# Patient Record
Sex: Male | Born: 1970 | Race: Black or African American | Hispanic: No | Marital: Single | Smoking: Current every day smoker
Health system: Southern US, Community
[De-identification: ages and names within clinical notes are randomized; demographics above are authoritative.]

## PROBLEM LIST (undated history)

## (undated) ENCOUNTER — Emergency Department (HOSPITAL_COMMUNITY): Admission: EM | Payer: Self-pay | Source: Home / Self Care

## (undated) ENCOUNTER — Emergency Department (HOSPITAL_COMMUNITY): Payer: Self-pay

## (undated) DIAGNOSIS — F323 Major depressive disorder, single episode, severe with psychotic features: Secondary | ICD-10-CM

## (undated) DIAGNOSIS — K529 Noninfective gastroenteritis and colitis, unspecified: Secondary | ICD-10-CM

## (undated) DIAGNOSIS — F1010 Alcohol abuse, uncomplicated: Secondary | ICD-10-CM

## (undated) DIAGNOSIS — F32A Depression, unspecified: Secondary | ICD-10-CM

## (undated) DIAGNOSIS — D649 Anemia, unspecified: Secondary | ICD-10-CM

---

## 2000-12-14 ENCOUNTER — Emergency Department (HOSPITAL_COMMUNITY): Admission: EM | Admit: 2000-12-14 | Discharge: 2000-12-14 | Payer: Self-pay | Admitting: Emergency Medicine

## 2001-10-23 ENCOUNTER — Encounter: Payer: Self-pay | Admitting: Emergency Medicine

## 2001-10-23 ENCOUNTER — Emergency Department (HOSPITAL_COMMUNITY): Admission: EM | Admit: 2001-10-23 | Discharge: 2001-10-23 | Payer: Self-pay | Admitting: Emergency Medicine

## 2002-01-27 ENCOUNTER — Encounter: Payer: Self-pay | Admitting: Emergency Medicine

## 2002-01-27 ENCOUNTER — Emergency Department (HOSPITAL_COMMUNITY): Admission: EM | Admit: 2002-01-27 | Discharge: 2002-01-27 | Payer: Self-pay | Admitting: Emergency Medicine

## 2002-06-01 ENCOUNTER — Emergency Department (HOSPITAL_COMMUNITY): Admission: EM | Admit: 2002-06-01 | Discharge: 2002-06-01 | Payer: Self-pay | Admitting: Emergency Medicine

## 2002-06-01 ENCOUNTER — Encounter: Payer: Self-pay | Admitting: Emergency Medicine

## 2004-09-28 ENCOUNTER — Inpatient Hospital Stay (HOSPITAL_COMMUNITY): Admission: EM | Admit: 2004-09-28 | Discharge: 2004-09-28 | Payer: Self-pay | Admitting: *Deleted

## 2005-03-03 ENCOUNTER — Emergency Department (HOSPITAL_COMMUNITY): Admission: EM | Admit: 2005-03-03 | Discharge: 2005-03-03 | Payer: Self-pay | Admitting: Emergency Medicine

## 2005-03-11 ENCOUNTER — Emergency Department (HOSPITAL_COMMUNITY): Admission: EM | Admit: 2005-03-11 | Discharge: 2005-03-12 | Payer: Self-pay | Admitting: Emergency Medicine

## 2005-03-25 ENCOUNTER — Emergency Department (HOSPITAL_COMMUNITY): Admission: EM | Admit: 2005-03-25 | Discharge: 2005-03-25 | Payer: Self-pay | Admitting: Emergency Medicine

## 2006-02-14 ENCOUNTER — Emergency Department (HOSPITAL_COMMUNITY): Admission: EM | Admit: 2006-02-14 | Discharge: 2006-02-14 | Payer: Self-pay | Admitting: Emergency Medicine

## 2006-03-23 ENCOUNTER — Emergency Department (HOSPITAL_COMMUNITY): Admission: EM | Admit: 2006-03-23 | Discharge: 2006-03-23 | Payer: Self-pay | Admitting: Emergency Medicine

## 2006-03-26 ENCOUNTER — Emergency Department (HOSPITAL_COMMUNITY): Admission: EM | Admit: 2006-03-26 | Discharge: 2006-03-26 | Payer: Self-pay | Admitting: Emergency Medicine

## 2006-09-22 ENCOUNTER — Emergency Department (HOSPITAL_COMMUNITY): Admission: EM | Admit: 2006-09-22 | Discharge: 2006-09-22 | Payer: Self-pay | Admitting: Emergency Medicine

## 2010-09-19 NOTE — H&P (Signed)
[name], [name]            ACCOUNT NO.:  [id number]   MEDICAL RECORD NO.:  [id number]          PATIENT TYPE:  INP   LOCATION:  1826                         FACILITY:  [facility]   PHYSICIAN:  Jackie Plum, M.D.DATE OF BIRTH:  Aug 09, 1970   DATE OF ADMISSION:  09/27/2004  DATE OF DISCHARGE:                                HISTORY & PHYSICAL   CHIEF COMPLAINT:  Decreased responsiveness.   HISTORY OF PRESENT ILLNESS:  Please note that the patient is unable to give  history at this point.  All history is from the patient's ER records and  mostly ER physician's notes.   The patient is a 40 year old African American gentleman who was brought to  the ER on account of the above complaint.  Apparently the patient had been  drinking alcohol and was brought to the ED in a stuporous state.  In the  emergency room, blood work was remarkable for alcohol level of 416 mg/dl.  His urine drug screen was positive for tetrahydrocannabinol.  Observation  admit was therefore requested by ED physician.   Past medical history, medicines, family history, and social history are not  available.  Review of systems was not available at this point.  Again,  review of systems was not obtained on account of patient's condition.   PHYSICAL EXAMINATION:  VITAL SIGNS:  Blood pressure 126/71,  pulse 81,  respirations 12, temperature 97.9 rectally.  O2 saturation was 87.7 on room  air.  GENERAL:  The patient was not in any acute cardiopulmonary distress.  The  patient was arousable by deep sternal pressure with localization and he  moved all four extremities spontaneously.  HEENT:  Pupils were equal, round, reactive to light.  Normocephalic,  atraumatic.  There was no conjunctival pallor or icterus.  Oropharynx was  moist.  LUNGS:  Breath sounds were adequate and were vesicular.  CARDIAC:  Regular with no adenopathy.  ABDOMEN:  Soft, nontender.  EXTREMITIES:  No edema, no cyanosis.   LABORATORY DATA:   Sodium was 145, potassium 3.5, chloride 111, BUN 12,  glucose 88.  PH 7.234, PCO2 46.1, bicarb 24.5.  Hemoglobin 41, hematocrit  13.9.  Alcohol level 416 mg/dl.  Urine drug screen was positive for  tetrahydrocannabinol.  X-ray was notable for mild bronchitic change without  any acute infiltrates.   IMPRESSION:  Alcohol intoxication with drug abuse; i.e. marijuana.  The  patient admitted to step-down bed for supportive care.  He is currently  given IV normal saline.  We will switch him from IV normal saline to D-5  normal saline with potassium.  Alcohol withdrawal protocol will be  instituted.  He will be carefully watched for any aspiration.  GI  prophylaxis will also be instituted.  The patient does not seem to have any  acute neurologic deficit at this point and the cause of his mental status  changes are likely due to purely alcohol intoxication and marijuana  abuse.  ED physician, Dr. Stacie Acres had mentioned to me that they had done an CT  scan of the head which was negative.  However, the result was unavailable  for  my review at this point.  Purpose of admission is for early observation  only overnight until the patient status is improved and possibly referred  for alcohol rehab as needed.      GO/MEDQ  D:  09/28/2004  T:  09/28/2004  Job:  [id number]

## 2010-12-06 ENCOUNTER — Emergency Department (HOSPITAL_COMMUNITY)
Admission: EM | Admit: 2010-12-06 | Discharge: 2010-12-07 | Disposition: A | Discharge status: Home / Self Care | Payer: Self-pay | Attending: Emergency Medicine | Admitting: Emergency Medicine

## 2010-12-06 DIAGNOSIS — M545 Low back pain, unspecified: Secondary | ICD-10-CM | POA: Insufficient documentation

## 2010-12-06 DIAGNOSIS — F1010 Alcohol abuse, uncomplicated: Secondary | ICD-10-CM | POA: Insufficient documentation

## 2010-12-07 ENCOUNTER — Emergency Department (HOSPITAL_COMMUNITY)
Admission: EM | Admit: 2010-12-07 | Discharge: 2010-12-08 | Disposition: A | Discharge status: Home / Self Care | Payer: Self-pay | Attending: Emergency Medicine | Admitting: Emergency Medicine

## 2010-12-07 DIAGNOSIS — F3289 Other specified depressive episodes: Secondary | ICD-10-CM | POA: Insufficient documentation

## 2010-12-07 DIAGNOSIS — M549 Dorsalgia, unspecified: Secondary | ICD-10-CM | POA: Insufficient documentation

## 2010-12-07 DIAGNOSIS — G8929 Other chronic pain: Secondary | ICD-10-CM | POA: Insufficient documentation

## 2010-12-07 DIAGNOSIS — F1210 Cannabis abuse, uncomplicated: Secondary | ICD-10-CM | POA: Insufficient documentation

## 2010-12-07 DIAGNOSIS — F1010 Alcohol abuse, uncomplicated: Secondary | ICD-10-CM | POA: Insufficient documentation

## 2010-12-07 DIAGNOSIS — F329 Major depressive disorder, single episode, unspecified: Secondary | ICD-10-CM | POA: Insufficient documentation

## 2010-12-07 LAB — DIFFERENTIAL
Basophils Absolute: 0 10*3/uL (ref 0.0–0.1)
Basophils Relative: 1 % (ref 0–1)
Eosinophils Absolute: 0.3 10*3/uL (ref 0.0–0.7)
Eosinophils Relative: 4 % (ref 0–5)
Lymphocytes Relative: 48 % — ABNORMAL HIGH (ref 12–46)
Lymphs Abs: 1.8 10*3/uL (ref 0.7–4.0)
Monocytes Absolute: 0.5 10*3/uL (ref 0.1–1.0)
Monocytes Relative: 7 % (ref 3–12)
Neutro Abs: 2.9 10*3/uL (ref 1.7–7.7)
Neutro Abs: 2.3 10*3/uL (ref 1.7–7.7)
Neutrophils Relative %: 48 % (ref 43–77)

## 2010-12-07 LAB — URINALYSIS, ROUTINE W REFLEX MICROSCOPIC
Glucose, UA: NEGATIVE mg/dL
Hgb urine dipstick: NEGATIVE
Leukocytes, UA: NEGATIVE
Protein, ur: NEGATIVE mg/dL
Specific Gravity, Urine: 1.021 (ref 1.005–1.030)
pH: 5 (ref 5.0–8.0)

## 2010-12-07 LAB — POCT I-STAT, CHEM 8
BUN: 10 mg/dL (ref 6–23)
Calcium, Ion: 1.22 mmol/L (ref 1.12–1.32)
Creatinine, Ser: 1.1 mg/dL (ref 0.50–1.35)
Creatinine, Ser: 0.8 mg/dL (ref 0.50–1.35)
Glucose, Bld: 127 mg/dL — ABNORMAL HIGH (ref 70–99)
Glucose, Bld: 108 mg/dL — ABNORMAL HIGH (ref 70–99)
HCT: 38 % — ABNORMAL LOW (ref 39.0–52.0)
Hemoglobin: 12.9 g/dL — ABNORMAL LOW (ref 13.0–17.0)
Hemoglobin: 12.9 g/dL — ABNORMAL LOW (ref 13.0–17.0)
Potassium: 3.8 mEq/L (ref 3.5–5.1)
Potassium: 4.2 mEq/L (ref 3.5–5.1)
TCO2: 27 mmol/L (ref 0–100)

## 2010-12-07 LAB — CBC
HCT: 34.4 % — ABNORMAL LOW (ref 39.0–52.0)
Hemoglobin: 11.2 g/dL — ABNORMAL LOW (ref 13.0–17.0)
Hemoglobin: 12 g/dL — ABNORMAL LOW (ref 13.0–17.0)
MCH: 30.5 pg (ref 26.0–34.0)
MCHC: 32.6 g/dL (ref 30.0–36.0)
MCV: 93.7 fL (ref 78.0–100.0)
Platelets: 255 10*3/uL (ref 150–400)
RBC: 3.81 MIL/uL — ABNORMAL LOW (ref 4.22–5.81)
RDW: 13.7 % (ref 11.5–15.5)
WBC: 4.8 10*3/uL (ref 4.0–10.5)

## 2010-12-07 LAB — GLUCOSE, CAPILLARY: Glucose-Capillary: 172 mg/dL — ABNORMAL HIGH (ref 70–99)

## 2010-12-07 LAB — RAPID URINE DRUG SCREEN, HOSP PERFORMED
Amphetamines: NOT DETECTED
Barbiturates: NOT DETECTED
Benzodiazepines: NOT DETECTED
Benzodiazepines: NOT DETECTED
Cocaine: NOT DETECTED
Opiates: NOT DETECTED
Tetrahydrocannabinol: POSITIVE — AB

## 2010-12-07 LAB — ETHANOL: Alcohol, Ethyl (B): 38 mg/dL — ABNORMAL HIGH (ref 0–11)

## 2010-12-10 ENCOUNTER — Emergency Department (HOSPITAL_COMMUNITY)
Admission: EM | Admit: 2010-12-10 | Discharge: 2010-12-10 | Disposition: A | Discharge status: Home / Self Care | Payer: Self-pay | Attending: Emergency Medicine | Admitting: Emergency Medicine

## 2010-12-10 DIAGNOSIS — IMO0001 Reserved for inherently not codable concepts without codable children: Secondary | ICD-10-CM | POA: Insufficient documentation

## 2010-12-10 DIAGNOSIS — F3289 Other specified depressive episodes: Secondary | ICD-10-CM | POA: Insufficient documentation

## 2010-12-10 DIAGNOSIS — F329 Major depressive disorder, single episode, unspecified: Secondary | ICD-10-CM | POA: Insufficient documentation

## 2010-12-10 LAB — COMPREHENSIVE METABOLIC PANEL
Albumin: 4.2 g/dL (ref 3.5–5.2)
Alkaline Phosphatase: 54 U/L (ref 39–117)
BUN: 8 mg/dL (ref 6–23)
CO2: 29 mEq/L (ref 19–32)
Chloride: 105 mEq/L (ref 96–112)
GFR calc Af Amer: >60 mL/min (ref >60)
GFR calc non Af Amer: >60 mL/min (ref >60)
Glucose, Bld: 89 mg/dL (ref 70–99)
Potassium: 4.1 mEq/L (ref 3.5–5.1)
Total Bilirubin: 0.2 mg/dL — ABNORMAL LOW (ref 0.3–1.2)

## 2010-12-10 LAB — CBC
HCT: 34.4 % — ABNORMAL LOW (ref 39.0–52.0)
Hemoglobin: 11.1 g/dL — ABNORMAL LOW (ref 13.0–17.0)
MCV: 94.2 fL (ref 78.0–100.0)
WBC: 6.5 10*3/uL (ref 4.0–10.5)

## 2010-12-10 LAB — DIFFERENTIAL
Basophils Absolute: 0.1 10*3/uL (ref 0.0–0.1)
Lymphocytes Relative: 49 % — ABNORMAL HIGH (ref 12–46)
Lymphs Abs: 3.2 10*3/uL (ref 0.7–4.0)
Neutro Abs: 2.4 10*3/uL (ref 1.7–7.7)

## 2010-12-10 LAB — RAPID URINE DRUG SCREEN, HOSP PERFORMED: Amphetamines: NOT DETECTED

## 2010-12-10 LAB — ETHANOL: Alcohol, Ethyl (B): 228 mg/dL — ABNORMAL HIGH (ref 0–11)

## 2011-05-26 ENCOUNTER — Emergency Department (HOSPITAL_COMMUNITY)
Admission: EM | Admit: 2011-05-26 | Discharge: 2011-05-26 | Disposition: A | Discharge status: Other Acute Inpatient Hospital | Payer: Self-pay | Attending: Internal Medicine | Admitting: Internal Medicine

## 2011-05-26 ENCOUNTER — Encounter (HOSPITAL_COMMUNITY): Payer: Self-pay | Admitting: Emergency Medicine

## 2011-05-26 ENCOUNTER — Encounter (HOSPITAL_COMMUNITY): Payer: Self-pay | Admitting: *Deleted

## 2011-05-26 ENCOUNTER — Inpatient Hospital Stay (HOSPITAL_COMMUNITY)
Admission: AD | Admit: 2011-05-26 | Discharge: 2011-06-01 | DRG: 897 | Disposition: A | Discharge status: Home / Self Care | Payer: PRIVATE HEALTH INSURANCE | Source: Ambulatory Visit | Attending: Psychiatry | Admitting: Psychiatry

## 2011-05-26 DIAGNOSIS — F10939 Alcohol use, unspecified with withdrawal, unspecified: Secondary | ICD-10-CM

## 2011-05-26 DIAGNOSIS — F329 Major depressive disorder, single episode, unspecified: Secondary | ICD-10-CM

## 2011-05-26 DIAGNOSIS — F1020 Alcohol dependence, uncomplicated: Principal | ICD-10-CM

## 2011-05-26 DIAGNOSIS — M549 Dorsalgia, unspecified: Secondary | ICD-10-CM

## 2011-05-26 DIAGNOSIS — F1010 Alcohol abuse, uncomplicated: Secondary | ICD-10-CM | POA: Insufficient documentation

## 2011-05-26 DIAGNOSIS — R45851 Suicidal ideations: Secondary | ICD-10-CM

## 2011-05-26 DIAGNOSIS — G8929 Other chronic pain: Secondary | ICD-10-CM

## 2011-05-26 DIAGNOSIS — F3289 Other specified depressive episodes: Secondary | ICD-10-CM | POA: Insufficient documentation

## 2011-05-26 DIAGNOSIS — F17200 Nicotine dependence, unspecified, uncomplicated: Secondary | ICD-10-CM | POA: Insufficient documentation

## 2011-05-26 DIAGNOSIS — F1910 Other psychoactive substance abuse, uncomplicated: Secondary | ICD-10-CM | POA: Diagnosis not present

## 2011-05-26 DIAGNOSIS — F10239 Alcohol dependence with withdrawal, unspecified: Secondary | ICD-10-CM

## 2011-05-26 HISTORY — DX: Depression, unspecified: F32.A

## 2011-05-26 LAB — BASIC METABOLIC PANEL
BUN: 6 mg/dL (ref 6–23)
Calcium: 9.5 mg/dL (ref 8.4–10.5)
GFR calc non Af Amer: >90 mL/min (ref >90)
Glucose, Bld: 111 mg/dL — ABNORMAL HIGH (ref 70–99)

## 2011-05-26 LAB — CBC
Hemoglobin: 13.5 g/dL (ref 13.0–17.0)
MCH: 32.6 pg (ref 26.0–34.0)
MCHC: 34.4 g/dL (ref 30.0–36.0)
MCV: 94.9 fL (ref 78.0–100.0)
Platelets: 232 10*3/uL (ref 150–400)
RBC: 4.14 MIL/uL — ABNORMAL LOW (ref 4.22–5.81)

## 2011-05-26 LAB — DIFFERENTIAL
Eosinophils Absolute: 0.3 10*3/uL (ref 0.0–0.7)
Eosinophils Relative: 4 % (ref 0–5)
Lymphs Abs: 3.4 10*3/uL (ref 0.7–4.0)
Monocytes Relative: 9 % (ref 3–12)

## 2011-05-26 LAB — RAPID URINE DRUG SCREEN, HOSP PERFORMED: Barbiturates: NOT DETECTED

## 2011-05-26 LAB — URINALYSIS, ROUTINE W REFLEX MICROSCOPIC
Nitrite: NEGATIVE
Specific Gravity, Urine: 1.014 (ref 1.005–1.030)
Urobilinogen, UA: 0.2 mg/dL (ref 0.0–1.0)

## 2011-05-26 LAB — ETHANOL: Alcohol, Ethyl (B): 275 mg/dL — ABNORMAL HIGH (ref 0–11)

## 2011-05-26 MED ORDER — THIAMINE HCL 100 MG/ML IJ SOLN
100.0000 mg | Freq: Once | INTRAMUSCULAR | Status: AC
  Administered 2011-05-26: 100 mg via INTRAMUSCULAR

## 2011-05-26 MED ORDER — CHLORDIAZEPOXIDE HCL 25 MG PO CAPS
25.0000 mg | ORAL_CAPSULE | ORAL | Status: AC
  Administered 2011-05-29 – 2011-05-30 (×2): 25 mg via ORAL
  Filled 2011-05-26 (×2): qty 1

## 2011-05-26 MED ORDER — ONDANSETRON 4 MG PO TBDP: 4.0000 mg | ORAL_TABLET | Freq: Four times a day (QID) | ORAL | Status: AC | PRN

## 2011-05-26 MED ORDER — ACETAMINOPHEN 325 MG PO TABS: 650.0000 mg | ORAL_TABLET | Freq: Four times a day (QID) | ORAL | Status: DC | PRN

## 2011-05-26 MED ORDER — LORAZEPAM 1 MG PO TABS: 1.0000 mg | ORAL_TABLET | Freq: Three times a day (TID) | ORAL | Status: DC | PRN

## 2011-05-26 MED ORDER — ZOLPIDEM TARTRATE 5 MG PO TABS: 5.0000 mg | ORAL_TABLET | Freq: Every evening | ORAL | Status: DC | PRN

## 2011-05-26 MED ORDER — FOLIC ACID 1 MG PO TABS
1.0000 mg | ORAL_TABLET | Freq: Every day | ORAL | Status: DC
  Administered 2011-05-26: 1 mg via ORAL
  Filled 2011-05-26: qty 1

## 2011-05-26 MED ORDER — LORAZEPAM 1 MG PO TABS
0.0000 mg | ORAL_TABLET | Freq: Four times a day (QID) | ORAL | Status: DC
  Administered 2011-05-26: 1 mg via ORAL

## 2011-05-26 MED ORDER — CHLORDIAZEPOXIDE HCL 25 MG PO CAPS
25.0000 mg | ORAL_CAPSULE | Freq: Four times a day (QID) | ORAL | Status: AC
  Administered 2011-05-27 – 2011-05-28 (×6): 25 mg via ORAL
  Filled 2011-05-26 (×6): qty 1

## 2011-05-26 MED ORDER — CHLORDIAZEPOXIDE HCL 25 MG PO CAPS
25.0000 mg | ORAL_CAPSULE | Freq: Every day | ORAL | Status: AC
  Filled 2011-05-26: qty 1

## 2011-05-26 MED ORDER — VITAMIN B-1 100 MG PO TABS
100.0000 mg | ORAL_TABLET | Freq: Every day | ORAL | Status: DC
  Administered 2011-05-26: 100 mg via ORAL
  Filled 2011-05-26: qty 1

## 2011-05-26 MED ORDER — IBUPROFEN 600 MG PO TABS
600.0000 mg | ORAL_TABLET | Freq: Three times a day (TID) | ORAL | Status: DC | PRN
  Administered 2011-05-26: 600 mg via ORAL
  Filled 2011-05-26: qty 1

## 2011-05-26 MED ORDER — ADULT MULTIVITAMIN W/MINERALS CH
1.0000 | ORAL_TABLET | Freq: Every day | ORAL | Status: DC
  Administered 2011-05-26: 1 via ORAL
  Filled 2011-05-26: qty 1

## 2011-05-26 MED ORDER — VITAMIN B-1 100 MG PO TABS
100.0000 mg | ORAL_TABLET | Freq: Every day | ORAL | Status: DC
  Administered 2011-05-27 – 2011-06-01 (×6): 100 mg via ORAL
  Filled 2011-05-26 (×8): qty 1

## 2011-05-26 MED ORDER — LORAZEPAM 1 MG PO TABS: 0.0000 mg | ORAL_TABLET | Freq: Two times a day (BID) | ORAL | Status: DC

## 2011-05-26 MED ORDER — HYDROXYZINE HCL 25 MG PO TABS: 25.0000 mg | ORAL_TABLET | Freq: Four times a day (QID) | ORAL | Status: AC | PRN

## 2011-05-26 MED ORDER — ADULT MULTIVITAMIN W/MINERALS CH
1.0000 | ORAL_TABLET | Freq: Every day | ORAL | Status: DC
  Administered 2011-05-26: 22:00:00 via ORAL
  Administered 2011-05-27 – 2011-06-01 (×6): 1 via ORAL
  Filled 2011-05-26 (×8): qty 1

## 2011-05-26 MED ORDER — CHLORDIAZEPOXIDE HCL 25 MG PO CAPS
25.0000 mg | ORAL_CAPSULE | Freq: Three times a day (TID) | ORAL | Status: AC
  Administered 2011-05-28 – 2011-05-29 (×2): 25 mg via ORAL
  Filled 2011-05-26 (×2): qty 1

## 2011-05-26 MED ORDER — NICOTINE 21 MG/24HR TD PT24
21.0000 mg | MEDICATED_PATCH | Freq: Every day | TRANSDERMAL | Status: DC
  Filled 2011-05-26: qty 1

## 2011-05-26 MED ORDER — LOPERAMIDE HCL 2 MG PO CAPS: 2.0000 mg | ORAL_CAPSULE | ORAL | Status: AC | PRN

## 2011-05-26 MED ORDER — LORAZEPAM 1 MG PO TABS
1.0000 mg | ORAL_TABLET | Freq: Four times a day (QID) | ORAL | Status: DC | PRN
  Filled 2011-05-26: qty 1

## 2011-05-26 MED ORDER — ALUM & MAG HYDROXIDE-SIMETH 200-200-20 MG/5ML PO SUSP: 30.0000 mL | ORAL | Status: DC | PRN

## 2011-05-26 MED ORDER — ONDANSETRON HCL 4 MG PO TABS: 4.0000 mg | ORAL_TABLET | Freq: Three times a day (TID) | ORAL | Status: DC | PRN

## 2011-05-26 MED ORDER — THIAMINE HCL 100 MG/ML IJ SOLN: 100.0000 mg | Freq: Every day | INTRAMUSCULAR | Status: DC

## 2011-05-26 MED ORDER — MAGNESIUM HYDROXIDE 400 MG/5ML PO SUSP: 30.0000 mL | Freq: Every day | ORAL | Status: DC | PRN

## 2011-05-26 MED ORDER — CHLORDIAZEPOXIDE HCL 25 MG PO CAPS
50.0000 mg | ORAL_CAPSULE | Freq: Once | ORAL | Status: AC
  Administered 2011-05-26: 50 mg via ORAL
  Filled 2011-05-26: qty 2

## 2011-05-26 MED ORDER — CHLORDIAZEPOXIDE HCL 25 MG PO CAPS: 25.0000 mg | ORAL_CAPSULE | Freq: Four times a day (QID) | ORAL | Status: AC | PRN

## 2011-05-26 MED ORDER — LORAZEPAM 2 MG/ML IJ SOLN: 1.0000 mg | Freq: Four times a day (QID) | INTRAMUSCULAR | Status: DC | PRN

## 2011-05-26 NOTE — ED Provider Notes (Signed)
History     CSN: [id number]  Arrival date & time 05/26/11  0348   First MD Initiated Contact with Patient 05/26/11 [phone]      Chief Complaint  Patient presents with  . Medical Clearance    (Consider location/radiation/quality/duration/timing/severity/associated sxs/prior treatment) HPI  History reviewed. No pertinent past medical history.  History reviewed. No pertinent past surgical history.  History reviewed. No pertinent family history.  History  Substance Use Topics  . Smoking status: Current Everyday Smoker -- 0.5 packs/day  . Smokeless tobacco: Not on file  . Alcohol Use: Yes     heavy       Review of Systems  Allergies  Review of patient's allergies indicates no known allergies.  Home Medications  No current outpatient prescriptions on file.  BP 102/63  Pulse 97  Temp(Src) 98.4 F (36.9 C) (Oral)  Resp 16  SpO2 92%  Physical Exam  ED Course  Procedures (including critical care time)  Labs Reviewed  CBC - Abnormal; Notable for the following:    RBC 4.14 (*)    All other components within normal limits  BASIC METABOLIC PANEL - Abnormal; Notable for the following:    Glucose, Bld 111 (*)    All other components within normal limits  ETHANOL - Abnormal; Notable for the following:    Alcohol, Ethyl (B) 275 (*)    All other components within normal limits  DIFFERENTIAL  URINE RAPID DRUG SCREEN (HOSP PERFORMED)  URINALYSIS, ROUTINE W REFLEX MICROSCOPIC   No results found.   No diagnosis found.    MDM  Duplicate note        Doug Sou, MD 05/26/11 2320

## 2011-05-26 NOTE — Progress Notes (Signed)
Patient ID: [name], male   DOB: 1971/01/04, 41 y.o.   MRN: [id number] Pt is voluntary. Pt came into the ED and was positive for SI but had no plan and currently denies SI/HI. Pt states he does not currently hear voices but at times he does. Pt states that he is depressed and his drinking has increased. Pt states he drinks about two 12 pack of beers a day. Pt does state he has back pain periodically and right now denies any pain. Pt is motivated to get help and is cooperative and pleasant.

## 2011-05-26 NOTE — ED Notes (Signed)
CIWA 6 but patient refused to take the Ativan after it was opened, med wasted

## 2011-05-26 NOTE — BH Assessment (Addendum)
Assessment Note   [name] is an 41 y.o. male. Patient is suicidal with no plan. He reports worsening depression due to his "situation". Pt sts, "My living situation, my money, everything makes me feel like nothing". Patient is living with various friends and has no support here in this area. He reports having limited support due to his immediate family living in [place]. Patient has no history of previous self harm. He is unable to contract for safety. Patient denies HI. Patient also denies AVH. Patient also reports alcohol use. Sts he drinks a "couple of cases of beer" per day however it depends on his money. Patient has drank heavily for the past several yrs. He received treatment in the past at a facility in [place] for alcohol abuse. Pt denies drug use.  Axis I: Major Depression, single episode and Alcohol Dependence Axis II: Deferred Axis III:  Past Medical History  Diagnosis Date  . Depression    Axis IV: economic problems, housing problems, occupational problems, other psychosocial or environmental problems, problems related to social environment, problems with access to health care services and problems with primary support group Axis V: 31-40 impairment in reality testing  Past Medical History:  Past Medical History  Diagnosis Date  . Depression     History reviewed. No pertinent past surgical history.  Family History: History reviewed. No pertinent family history.  Social History:  reports that he has been smoking.  He does not have any smokeless tobacco history on file. He reports that he drinks about 50.4 ounces of alcohol per week. He reports that he does not use illicit drugs.  Additional Social History:  Alcohol / Drug Use Pain Medications: none reported Prescriptions: none reported Over the Counter: none reported History of alcohol / drug use?: Yes Substance #1 Name of Substance 1: Alcohol 1 - Age of First Use: 10 1 - Amount (size/oz): "couple of 12  packs of beer" and "depends on my money" 1 - Frequency: daily  1 - Duration: "past several yrs" 1 - Last Use / Amount: "last night"; pt sts he is unable to recall how much he drank Allergies: No Known Allergies  Home Medications:  Medications Prior to Admission  Medication Dose Route Frequency Provider Last Rate Last Dose  . folic acid (FOLVITE) tablet 1 mg  1 mg Oral Daily Doug Sou, MD   1 mg at 05/26/11 9562  . ibuprofen (ADVIL,MOTRIN) tablet 600 mg  600 mg Oral Q8H PRN Ethelda Chick, MD   600 mg at 05/26/11 0830  . LORazepam (ATIVAN) tablet 1 mg  1 mg Oral Q6H PRN Doug Sou, MD       Or  . LORazepam (ATIVAN) injection 1 mg  1 mg Intravenous Q6H PRN Doug Sou, MD      . LORazepam (ATIVAN) tablet 0-4 mg  0-4 mg Oral Q6H Doug Sou, MD   1 mg at 05/26/11 0824   Followed by  . LORazepam (ATIVAN) tablet 0-4 mg  0-4 mg Oral Q12H Doug Sou, MD      . LORazepam (ATIVAN) tablet 1 mg  1 mg Oral Q8H PRN Ethelda Chick, MD      . mulitivitamin with minerals tablet 1 tablet  1 tablet Oral Daily Doug Sou, MD   1 tablet at 05/26/11 [phone]  . nicotine (NICODERM CQ - dosed in mg/24 hours) patch 21 mg  21 mg Transdermal Daily Ethelda Chick, MD      . ondansetron [facility]) tablet  4 mg  4 mg Oral Q8H PRN Ethelda Chick, MD      . thiamine (VITAMIN B-1) tablet 100 mg  100 mg Oral Daily Doug Sou, MD   100 mg at 05/26/11 1610   Or  . thiamine (B-1) injection 100 mg  100 mg Intravenous Daily Doug Sou, MD      . zolpidem (AMBIEN) tablet 5 mg  5 mg Oral QHS PRN Ethelda Chick, MD       No current outpatient prescriptions on file as of 05/26/2011.    OB/GYN Status:  No LMP for male patient.  General Assessment Data Location of Assessment: [facility] ED ACT Assessment: Yes Living Arrangements: Other (Comment);Homeless (lives with various friends) Can pt return to current living arrangement?: Yes Admission Status: Involuntary Is patient capable of signing voluntary  admission?: Yes Transfer from: Acute Hospital Referral Source: Self/Family/Friend     Risk to self Suicidal Ideation: Yes-Currently Present Suicidal Intent: Yes-Currently Present Is patient at risk for suicide?: Yes Suicidal Plan?: No Access to Means: Yes (sharp objects) Specify Access to Suicidal Means:  (knives) What has been your use of drugs/alcohol within the last 12 months?:  (alcohol-beer) Previous Attempts/Gestures: No How many times?:  (n/a) Other Self Harm Risks:  (n/a) Triggers for Past Attempts:  (no previous attempts noted) Intentional Self Injurious Behavior: None Family Suicide History: No Recent stressful life event(s): Other (Comment);Financial Problems (living arrangements,unemployment, economical) Persecutory voices/beliefs?: No Depression: Yes Depression Symptoms: Despondent;Isolating;Fatigue;Guilt;Loss of interest in usual pleasures;Feeling worthless/self pity;Feeling angry/irritable Substance abuse history and/or treatment for substance abuse?: Yes (Alcoholism; pt receiving tx at [facility]) Suicide prevention information given to non-admitted patients: Not applicable  Risk to Others Homicidal Ideation: No Thoughts of Harm to Others: No Current Homicidal Intent: No Current Homicidal Plan: No Access to Homicidal Means: No Identified Victim:  (n/a) History of harm to others?: No Assessment of Violence: None Noted Violent Behavior Description:  (n/a) Criminal Charges Pending?: No Does patient have a court date: No  Psychosis Hallucinations: None noted Delusions: None noted  Mental Status Report Appear/Hygiene: Disheveled Eye Contact: Poor Motor Activity: Unremarkable Speech: Logical/coherent Level of Consciousness: Quiet/awake Mood: Depressed;Helpless Affect: Depressed Anxiety Level: None Thought Processes: Coherent Judgement: Unimpaired Orientation: Place;Person;Time Obsessive Compulsive Thoughts/Behaviors: None  Cognitive  Functioning Concentration: Decreased Memory: Recent Intact;Remote Intact IQ: Average Insight: Poor Impulse Control: Poor Appetite: Poor Weight Loss:  (n/a) Weight Gain:  (n/a) Sleep:  (n/a) Total Hours of Sleep:  ("3-4 and sometimes up to 6 hrs per day") Vegetative Symptoms: None  Prior Inpatient Therapy Prior Inpatient Therapy: Yes Prior Therapy Dates:  ("yrs ago" per patient) Prior Therapy Facilty/Provider(s):  (facility in [place]) Reason for Treatment:  (alcoholism; alcohol treatment)  Prior Outpatient Therapy Prior Outpatient Therapy: No  ADL Screening (condition at time of admission) Patient's cognitive ability adequate to safely complete daily activities?: No Patient able to express need for assistance with ADLs?: No Independently performs ADLs?: No Communication: Independent Dressing (OT): Independent Grooming: Independent Feeding: Independent Bathing: Independent Toileting: Independent In/Out Bed: Independent Walks in Home: Independent Weakness of Legs: None Weakness of Arms/Hands: None  Home Assistive Devices/Equipment Home Assistive Devices/Equipment: None    Abuse/Neglect Assessment (Assessment to be complete while patient is alone) Physical Abuse: Denies Verbal Abuse: Denies Sexual Abuse: Denies Exploitation of patient/patient's resources: Denies Self-Neglect: Denies Values / Beliefs Cultural Requests During Hospitalization: None Spiritual Requests During Hospitalization: None Consults Spiritual Care Consult Needed: No Social Work Consult Needed: No Merchant navy officer (For Healthcare) Advance Directive: Patient  does not have advance directive Nutrition Screen Diet: Regular  Additional Information 1:1 In Past 12 Months?: No CIRT Risk: No Elopement Risk: No Does patient have medical clearance?: Yes     Disposition:  Referred to [facility] for a potential in-pt admission. Information faxed to [facility] for on call psychiatrist, NP, and/or PA to  review.     On Site Evaluation by:   Reviewed with Physician:     Melynda Ripple [facility] 05/26/2011 11:47 AM

## 2011-05-26 NOTE — ED Notes (Signed)
Per EMS pt states he is tired and is just wanting to end his life and has been feeling this way for a while now  Pt states he drank a 12 pack of beer today and has not eaten in 3 days  VS stable

## 2011-05-26 NOTE — ED Provider Notes (Signed)
Note: pt agrees to transfer to [facility] on voluntary basis  Doug Sou, MD 05/26/11 1549

## 2011-05-26 NOTE — ED Provider Notes (Signed)
330 pm pt remains alert, cooperative. Feels depressed, cooperastive, gcs 15  Doug Sou, MD 05/26/11 1546

## 2011-05-26 NOTE — ED Provider Notes (Signed)
History     CSN: [id number]  Arrival date & time 05/26/11  0348   First MD Initiated Contact with Patient 05/26/11 [phone]      Chief Complaint  Patient presents with  . Medical Clearance    (Consider location/radiation/quality/duration/timing/severity/associated sxs/prior treatment) HPI Patient presenting with feelings of depression and alcohol abuse. He states that he drank 12 beers today. He denies other substance abuse. He states that he has been feeling more depressed over the past several weeks and has not been taking his medications for several months. He does not remember the names of his medications. He states he came to the emergency department tonight because he has been having thoughts of suicide and it scared him. There's no specific inciting event. He denies any hallucinations. He denies other medical problems and has not had any recent medical illnesses including no fevers coughs chest pain vomiting or diarrhea. He does endorse that he has had a poor appetite over the past several weeks.  There are no other alleviating or modifying factors, there are no associated systemic symptoms  History reviewed. No pertinent past medical history.  History reviewed. No pertinent past surgical history.  History reviewed. No pertinent family history.  History  Substance Use Topics  . Smoking status: Current Everyday Smoker -- 0.5 packs/day  . Smokeless tobacco: Not on file  . Alcohol Use: Yes     heavy       Review of Systems ROS reviewed and otherwise negative except for mentioned in HPI  Allergies  Review of patient's allergies indicates no known allergies.  Home Medications  No current outpatient prescriptions on file.  BP 102/63  Pulse 97  Temp(Src) 98.4 F (36.9 C) (Oral)  Resp 16  SpO2 92% Vitals reviewed Physical Exam Physical Examination: General appearance - alert,tired bu well appearing, mildly intoxicated and in no distress Mental status - alert, oriented  to person, place, and time Mouth - mucous membranes moist, pharynx normal without lesions Chest - clear to auscultation, no wheezes, rales or rhonchi, symmetric air entry Heart - normal rate, regular rhythm, normal S1, S2, no murmurs, rubs, clicks or gallops Abdomen - soft, nontender, nondistended, no masses or organomegaly Musculoskeletal - no joint tenderness, deformity or swelling Extremities - peripheral pulses normal, no pedal edema, no clubbing or cyanosis Skin - normal coloration and turgor, no rashes Psych- flat affect, no hallucinations or delusional thinking evident  ED Course  Procedures (including critical care time)  Labs Reviewed  CBC - Abnormal; Notable for the following:    RBC 4.14 (*)    All other components within normal limits  BASIC METABOLIC PANEL - Abnormal; Notable for the following:    Glucose, Bld 111 (*)    All other components within normal limits  ETHANOL - Abnormal; Notable for the following:    Alcohol, Ethyl (B) 275 (*)    All other components within normal limits  DIFFERENTIAL  URINE RAPID DRUG SCREEN (HOSP PERFORMED)  URINALYSIS, ROUTINE W REFLEX MICROSCOPIC   No results found.   1. Alcohol abuse   2. Depression       MDM  Patient presenting with depression, suicidal thoughts, and alcohol abuse. Medical clearance labs were obtained. There were reassuring except for elevated alcohol level. Patient was moved to the psych ED and will need to be evaluated by the ACT team for further management.        Ethelda Chick, MD 05/26/11 [phone]

## 2011-05-26 NOTE — [facility] Counselor (Signed)
Patient accepted to [facility] by Dr. Wallis Mart to [facility] Room 302-1. Support paperwork completed and faxed to [facility]. EDP-Sam Jocbowitz made aware of patients disposition and agrees to discharge patient. Nursing staff made aware. The nurse to nurse report was completed. Nursing staff will completed arrangements to have patient transported to [facility] accordingly. Patient to be transported to [facility] via security.

## 2011-05-26 NOTE — ED Notes (Signed)
Escorted to Psych ED 37-pt cooperative/ambulatory-2 bags belongings with pt-pt has been wanded-pt in scrubs

## 2011-05-26 NOTE — ED Notes (Signed)
Pt arrives on unit in stable condition, states he has SI but is vague about plan when asked by staff. Pt alert and oriented, slightly flat affect. Able to contract for safety at present. Denies needs at present.

## 2011-05-26 NOTE — ED Notes (Signed)
MD at bedside. 

## 2011-05-26 NOTE — ED Provider Notes (Signed)
presntly c/o back pain ,(chronic)  feels suicidal pt alert , gcs 15, ambultory . telelepschch consult called. Ibuprofen on oreder for . back pain .  ciwa protocol    Doug Sou, MD 05/26/11 [phone]

## 2011-05-27 DIAGNOSIS — F1020 Alcohol dependence, uncomplicated: Principal | ICD-10-CM

## 2011-05-27 MED ORDER — IBUPROFEN 800 MG PO TABS
800.0000 mg | ORAL_TABLET | Freq: Four times a day (QID) | ORAL | Status: DC | PRN
  Administered 2011-05-27: 800 mg via ORAL
  Filled 2011-05-27: qty 1

## 2011-05-27 NOTE — H&P (Signed)
Psychiatric Admission Assessment Adult  Patient Identification:  [name] Date of Evaluation:  05/27/2011 Chief Complaint:  MDD SINGLE EPISODE  ETOH DEP History of Present Illness:: Patient presenting with feelings of depression and alcohol abuse. He states that he drank 12 beers today. He denies other substance abuse. He states that he has been feeling more depressed over the past several weeks and has not been taking his medications for several months. He does not remember the names of his medications. He states he came to the emergency department tonight because he has been having thoughts of suicide and it scared him. There's no specific inciting event. He denies any hallucinations. He denies other medical problems and has not had any recent medical illnesses including no fevers coughs chest pain vomiting or diarrhea. He does endorse that he has had a poor appetite over the past several weeks. There are no other alleviating or modifying factors, there are no associated systemic symptoms  Mood Symptoms:   Depression Symptoms:   (Hypo) Manic Symptoms:  Anxiety Symptoms: Psychotic Symptoms:  PTSD Symptoms:   Abuse/Neglect/Assault/trauma:   Past Psychiatric History: Diagnosis:  Hospitalizations:  Multiple detoxes  Outpatient Care:  Substance Abuse Care:  Self-Mutilation:  Suicidal Attempts:  none  Violent Behaviors:   Primary Care Provider:  none  Past Medical History:   Past Medical History  Diagnosis Date  . Depression    Traumatic Brain Injury: 0 History of Loss of Consciousness:  no Surgical History: Seizure History:  no Cardiac History:   no  Allergies:  No Known Allergies Current Medications:  Current Facility-Administered Medications  Medication Dose Route Frequency Provider Last Rate Last Dose  . alum & mag hydroxide-simeth (MAALOX/MYLANTA) 200-200-20 MG/5ML suspension 30 mL  30 mL Oral Q4H PRN Franchot Gallo, MD      . chlordiazePOXIDE (LIBRIUM) capsule 25  mg  25 mg Oral Q6H PRN Franchot Gallo, MD      . chlordiazePOXIDE (LIBRIUM) capsule 25 mg  25 mg Oral QID Alyson Kuroski-Mazzei, DO   25 mg at 05/27/11 2143   Followed by  . chlordiazePOXIDE (LIBRIUM) capsule 25 mg  25 mg Oral TID Alyson Kuroski-Mazzei, DO       Followed by  . chlordiazePOXIDE (LIBRIUM) capsule 25 mg  25 mg Oral BH-qamhs Alyson Kuroski-Mazzei, DO       Followed by  . chlordiazePOXIDE (LIBRIUM) capsule 25 mg  25 mg Oral Daily Alyson Kuroski-Mazzei, DO      . hydrOXYzine (ATARAX/VISTARIL) tablet 25 mg  25 mg Oral Q6H PRN Franchot Gallo, MD      . ibuprofen (ADVIL,MOTRIN) tablet 800 mg  800 mg Oral Q6H PRN Alyson Kuroski-Mazzei, DO   800 mg at 05/27/11 2056  . loperamide (IMODIUM) capsule 2-4 mg  2-4 mg Oral PRN Franchot Gallo, MD      . magnesium hydroxide (MILK OF MAGNESIA) suspension 30 mL  30 mL Oral Daily PRN Franchot Gallo, MD      . mulitivitamin with minerals tablet 1 tablet  1 tablet Oral Daily Franchot Gallo, MD   1 tablet at 05/27/11 0804  . ondansetron (ZOFRAN-ODT) disintegrating tablet 4 mg  4 mg Oral Q6H PRN Franchot Gallo, MD      . thiamine (VITAMIN B-1) tablet 100 mg  100 mg Oral Daily Franchot Gallo, MD   100 mg at 05/27/11 0804  . DISCONTD: acetaminophen (TYLENOL) tablet 650 mg  650 mg Oral Q6H PRN Franchot Gallo, MD        Previous Psychotropic Medications:  Medication Dose   risperdone  ?                     Substance Abuse History Substance Age of 1st Use Last Use Amount Specific Type  Nicotine      Alcohol      Cannabis      Opiates      Cocaine      Methamphetamines      LSD      Ecstasy      Benzodiazepines      Caffeine      Inhalants      Others:                         Legal Charges:  Time served: Court date: Dept. Social Services involvement:  DT's:  No Withdrawal Symptoms:  Tremors  Social History: Current Place of Residence:   Place of Birth:   Family Members: Marital Status:   Children: Education:   Religious  Beliefs/Practices: Employment: Hotel manager History:   Family History:  History reviewed. No pertinent family history.  ROS: Negative except as documented in HPI PE: Completed in ED by MD <24 hours ago. I have reviewed the results and evaluated the patient and agree with the evaluation.  No acute findings.  LABS:Labs Reviewed  CBC - Abnormal; Notable for the following:  RBC  4.14 (*)  All other components within normal limits  BASIC METABOLIC PANEL - Abnormal; Notable for the following:  Glucose, Bld  111 (*)  All other components within normal limits  ETHANOL - Abnormal; Notable for the following:  Alcohol, Ethyl (B)  275 (*)  All other components within normal limits  DIFFERENTIAL  URINE RAPID DRUG SCREEN (HOSP PERFORMED)     Mental Status Examination/Evaluation Objective:  Appearance: Casual  Eye Contact::  Fair  Speech:  Clear and Coherent  Volume:  Normal  Mood:   depressed  Affect:  Congruent  Thought Process:  Coherent and Intact  Orientation:  Full  Thought Content:  normal  Suicidal Thoughts:  Yes.  without intent/plan  Homicidal Thoughts:  No  Judgement:  Impaired  Insight:  Lacking  Psychomotor Activity:  Normal and   Akathisia:  No  Handed:  Right  AIMS (if indicated):     Assets:  Desire for Improvement    AXIS I Alcohol dependence  AXIS II Deferred  AXIS III Past Medical History  Diagnosis Date  . Depression    history of polysubstance abuse with multiple rehabs   AXIS IV economic problems  AXIS V 51-60 moderate symptoms   Recommendations:  Treatment Plan Summary: Daily contact with patient to assess and evaluate symptoms and progress in treatment Medication management  Observation Level/Precautions:  Laboratory:    Psychotherapy:    Medications:    Routine PRN Medications:  Yes  Consultations:    Discharge Concerns:    Other:      [name] 1/23/201311:34 PM
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
## 2011-05-27 NOTE — Progress Notes (Signed)
[facility] Group Notes:  (Counselor/Nursing/MHT/Case Management/Adjunct)  05/27/2011 1:03 PM   Type of Therapy:  Processing Group at 11:00 am  Participation Level: Minimal   Participation Quality:  Attended entire session  Affect:  Flat  Cognitive:  Oriented  Insight:  Unknown  Engagement in Group:  Minimal  Engagement in Therapy:  Unknown  Modes of Intervention:  Exploration  Summary of Progress/Problems:  Patient attended entire session and was mostly quiet; he did share "people make me angry when they're talking on the cell phone while driving"   [facility] Group Notes:  (Counselor/Nursing/MHT/Case Management/Adjunct)  05/27/2011 1:03 PM   Type of Therapy:  Counseling Group at 1:15 pm  Participation Level:  Active  Participation Quality:  Attentive and sharing  Affect:  Blunted  Cognitive:  Oriented  Insight:  Minimal  Engagement in Group:  Good  Engagement in Therapy:  Unknown  Modes of Intervention:  Exploration clarification and support  Summary of Progress/Problems:  Patient presented as somewhat irritable, and/or blunted, yet did appear attentive and willing to share when directly asked. [name] shared that he always felt he could be himself yet also expressed inability to trust others in his life currently.  Pt shared frustration with the group process.  [name], LCSWA 05/27/2011 1:03 PM

## 2011-05-27 NOTE — Tx Team (Signed)
Initial Interdisciplinary Treatment Plan  PATIENT STRENGTHS: (choose at least two) Communication skills General fund of knowledge Physical Health  PATIENT STRESSORS: Occupational concerns Substance abuse   PROBLEM LIST: Problem List/Patient Goals Date to be addressed Date deferred Reason deferred Estimated date of resolution  Depression 05-27-11     Substance abuse 05-27-11                                                DISCHARGE CRITERIA:  Adequate post-discharge living arrangements Improved stabilization in mood, thinking, and/or behavior Motivation to continue treatment in a less acute level of care Reduction of life-threatening or endangering symptoms to within safe limits Verbal commitment to aftercare and medication compliance Withdrawal symptoms are absent or subacute and managed without 24-hour nursing intervention  PRELIMINARY DISCHARGE PLAN: Attend aftercare/continuing care group Attend 12-step recovery group Outpatient therapy Placement in alternative living arrangements  PATIENT/FAMIILY INVOLVEMENT: This treatment plan has been presented to and reviewed with the patient, [name], and/or family member.  The patient and family have been given the opportunity to ask questions and make suggestions.  Mickeal Needy 05/27/2011, 12:52 AM

## 2011-05-27 NOTE — Progress Notes (Signed)
Attended morning d/c planning group. Decided to come to hospital to detox from alcohol and get help with his depression. Has gotten help before for his alcohol addiction at [facility], [facility], [address], and [facility]. Endorsed feelings of guilt for not being able to provide for his 3 daughters. Plan is to return home after d/c and attend church regularly.

## 2011-05-27 NOTE — Progress Notes (Signed)
Dr. Allena Katz notified upon noticing 1845 Librium load of 50mg  and scheduled dose of 25mg  not given. Dr. Allena Katz gave permission to give 50mg  dose now, omit 25mg  scheduled dosage for tonight and give PRN dosage as needed. Pharmacy notified spoke with [facility].

## 2011-05-27 NOTE — Progress Notes (Signed)
Patient ID: [name], male   DOB: 1970/07/22, 41 y.o.   MRN: [id number] Nursing    Pt.Is Medication compliant and attending all Groups.He rates his Depression as 6/10 and his Hopelessness as 8/10.He is pleasant and cooperative.Encouraged and supported,

## 2011-05-27 NOTE — Progress Notes (Signed)
Patient ID: [name], male   DOB: 1971-01-16, 41 y.o.   MRN: [id number] Pt. Reports "situations" is what brought him to hospital, in reading pt. Hx. He referred to situations being job situations, not having money, or family here lead to increased drinking. Pt. Did report drinking since 41 y.o. Says "drinking too much." Pt. Is suspicious and watchful. Writer reviewed meds, Pt. Nods in agreement to meds. Pt. Currently denies SHI. Staff will continue to monitor q72min for safety.

## 2011-05-28 MED ORDER — LIDOCAINE 5 % EX PTCH
1.0000 | MEDICATED_PATCH | CUTANEOUS | Status: DC
  Administered 2011-05-30: 1 via TRANSDERMAL
  Filled 2011-05-28 (×7): qty 1

## 2011-05-28 NOTE — Progress Notes (Signed)
[name]  41 y.o.  [id number] 06-Mar-1971  05/28/2011   Diagnosis:  Alcohol dependency  Subjective:           The patient was seen and evaluated by the Treatment team consisting of Psychiatrist, PAC, RN, Case Manager, and Therapist for evaluation and treatment plan with goal of stabilization upon discharge. The patient's physical and mental health problems were identified and treated appropriately.      Multiple modalities of treatment were used including medication, individual and group therapies, unit programming, AA/NA, improved nutrition, physical activity, and family sessions as needed.      Pt. Complained of back pain and topical medication was discussed.  He also requested a different roommate due to differences in opinion.   Objective:Vital Signs:Blood pressure 147/99, pulse 80, temperature 97.6 F (36.4 C), temperature source Oral, resp. rate 14, height 5\' 7"  (1.702 m), weight 132 lb (59.875 kg), SpO2 98.00%. Pt. Is very anxious and guarded. Requested help with pain and depression.  CM is made aware of difficulty speaking in groups.  Wants help with his alcohol problem treated.  Requested 1 on 1 therapy with counselor. Assessment: alcohol dependence                         Chronic pain Medications Scheduled:  . chlordiazePOXIDE  25 mg Oral QID   Followed by  . chlordiazePOXIDE  25 mg Oral TID   Followed by  . chlordiazePOXIDE  25 mg Oral BH-qamhs   Followed by  . chlordiazePOXIDE  25 mg Oral Daily  . mulitivitamin with minerals  1 tablet Oral Daily  . thiamine  100 mg Oral Daily  PRN Meds alum & mag hydroxide-simeth, chlordiazePOXIDE, hydrOXYzine, ibuprofen, loperamide, magnesium hydroxide, ondansetron, DISCONTD: acetaminophen  Plan: Continue current plan of care with changes to room an topical pain patch.  Anticipated D/C is 4 to five days.  Rona Ravens. Errik Mitchelle [facility] 05/28/2011 10:25 AM

## 2011-05-28 NOTE — Treatment Plan (Signed)
Interdisciplinary Treatment Plan Update (Adult)  Date: 05/28/2011  Time Reviewed: 8:11 AM   Progress in Treatment: Attending groups: Yes Participating in groups: Yes Taking medication as prescribed: Yes Tolerating medication: Yes   Family/Significant other contact made: Counselor to contact  family re: suicide pervention info Patient understands diagnosis:  Yes Discussing patient identified problems/goals with staff:  Yes Medical problems stabilized or resolved:  Yes Denies suicidal/homicidal ideation: Yes  Per self inventory Issues/concerns per patient self-inventory:  Yes  Poor sleep  Depression an 8, hopelessness 5  C/O pain, roommate, cough and sore throat Other:  New problem(s) identified: N/A  Reason for Continuation of Hospitalization: Depression Medication stabilization Withdrawal symptoms  Interventions implemented related to continuation of hospitalization: Librium detox protocol  Encourage group attendance and participation.    Additional comments:  Estimated length of stay:3-4 days  Discharge Plan:Return home follow up outpt  New goal(s): N/A  Review of initial/current patient goals per problem list:   1.  Goal(s):safely detox from alcohol  Met:  No  Target date:1/25  As evidenced NF:[id number] in CIWA score from current to 0  2.  Goal (s):Address back pain  Met:  No  Target date1/24:  As evidenced HQ:IONGE of lidocain patch   3.  Goal(s):Decrease depression  Met:  No  Target date:1/25  As evidenced XB:[id number] in current score of 8 to 3 or less on self inventory  4.  Goal(s):  Met:  No  Target date:  As evidenced by:  Attendees: Patient:  [name] 05/28/2011 8:11 AM  Family:     Physician:  Lupe Carney 05/28/2011 8:11 AM   Nursing: Cassie Freer   05/28/2011 8:11 AM   Case Manager:  Richelle Ito, LCSW 05/28/2011 8:11 AM   Counselor:  Ronda Fairly, LCSWA 05/28/2011 8:11 AM   Other:  Shelda Jakes PA 05/28/2011 8:11 AM    Other:     Other:     Other:      Scribe for Treatment Team:   Ida Rogue, 05/28/2011 8:11 AM

## 2011-05-28 NOTE — Progress Notes (Signed)
Patient requested first thing this morning for a new room.  Absolutely refuses to go back into his originally assigned room with the person he was in with.  Has laid down in the quiet room some throughout the day.  Patient was finally relocated this afternoon when we had a discharge.  He has been pleasant, respectful, and cooperative throughout the process.  Tolerating his Librium protocol and is having no breakthrough symptoms other than some mild anxiety.

## 2011-05-28 NOTE — [facility] Suicide Risk Assessment (Signed)
Suicide Risk Assessment  Admission Assessment     Demographic factors:  See chart.  Current Mental Status:  Current Mental Status:  (Denies SI/HI)  Patient seen and evaluated. Chart reviewed. Patient stated that his mood was "ok". His affect was mood congruent and constricted. He denied any current thoughts of self injurious behavior, suicidal ideation or homicidal ideation. He contracted on the unit and felt "safe" getting help. There were no auditory or visual hallucinations, paranoia, delusional thought processes, or mania noted.  Thought process was linear and goal directed.  No psychomotor agitation or retardation was noted. His speech was normal rate, tone and volume. Eye contact was good. Judgment and insight are fair.  Patient has been up and engaged on the unit.  No acute safety concerns reported from team. Hx AH.  No VH, command AH.  Loss Factors:  Loss Factors: Financial problems / change in socioeconomic status  Historical Factors:  Historical Factors: Family history of mental illness or substance abuse; denied hx trauma; denied hx SI in past, plans or attempts.  No firearms at home per his report.  Convicted felon for poss drugs. Denied FamHx of suicide or mental illness.  FamHx SA.  Risk Reduction Factors:  Risk Reduction Factors: Living with another person, especially a relative  CLINICAL FACTORS: Alcohol Dependence & W/D; Cannabis Abuse; r/o Psychotic Disorder NOS (AH in past)  COGNITIVE FEATURES THAT CONTRIBUTE TO RISK: limited insight; guarded  SUICIDE RISK: Pt viewed as a moderate increased risk of harm to self in light of his past hx and risk factors.  No acute safety concerns on the unit.  Pt contracting for safety and in need of crisis stabilization & Tx.  PLAN OF CARE: Pt admitted for crisis stabilization and treatment.  Please see orders.   Medications reviewed with pt and medication education provided.  Pt seen and evaluated with physician extender, please see H&P.   Will continue q15 minute checks per unit protocol.  No clinical indication for one on one level of observation at this time.  Pt contracting for safety.  Mental health treatment, medication management and continued sobriety will mitigate against the increased risk of harm to self and/or others.  Discussed the importance of recovery with pt, as well as, tools to move forward in a healthy & safe manner.  Pt agreeable with the plan.  Discussed with the team.   Lupe Carney 05/28/2011, 12:56 PM

## 2011-05-28 NOTE — Progress Notes (Signed)
Pt attended morning group. Stated that he was doing good but that he could be better. Went on to say that he did not feel like talking today during group.
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
## 2011-05-29 MED ORDER — NAPROXEN 500 MG PO TABS
500.0000 mg | ORAL_TABLET | Freq: Two times a day (BID) | ORAL | Status: DC
  Administered 2011-05-29 – 2011-06-01 (×6): 500 mg via ORAL
  Filled 2011-05-29 (×11): qty 1

## 2011-05-29 MED ORDER — NAPROXEN SODIUM 550 MG PO TABS: 550.0000 mg | ORAL_TABLET | Freq: Two times a day (BID) | ORAL | Status: DC

## 2011-05-29 NOTE — Progress Notes (Signed)
Patient ID: [name], male   DOB: 10-Aug-1970, 41 y.o.   MRN: [id number] [name]  41 y.o.  [id number] 03/13/1971  05/31/2011   Diagnosis: Alcohol dependency  Subjective: [name] is up and active in the milieu today.  He is keeping the air in his room very high.  [name] states he still has back pain and that the patch did not work.  He states he did not take the Aleve that was ordered because he didn't think it would work despite a lengthy education on NSAIDs yesterday.  He is still anticipating D/C on Monday. Says he needs the "strongest medication he can get" for his back pain.  Vital Signs:Blood pressure 135/90, pulse 94, temperature 98.6 F (37 C), temperature source Oral, resp. rate 16, height 5\' 7"  (1.702 m), weight 132 lb (59.875 kg), SpO2 98.00%. Objective: [name] appears to be quite comfortable, but he moves reasonably well. Spine is straight and has good range of motion. No paraspinal spasm.  No pain over the Sciatic joint. States doing ok with his detox. Reports no increased symptoms of withdrawal. Assessment: alcohol dependency                         Back pain uncertain etiology Medications Scheduled:  . chlordiazePOXIDE  25 mg Oral BH-qamhs   Followed by  . chlordiazePOXIDE  25 mg Oral Daily  . lidocaine  1 patch Transdermal Q24H  . mulitivitamin with minerals  1 tablet Oral Daily  . naproxen  500 mg Oral BID WC  . thiamine  100 mg Oral Daily   PRN Meds Naproxyn has been added and he is encouraged to use it.  Plan: Continue current plan of care with no changes at this time.  Anticipated D/C is Monday upon completion of librium detox.  Rona Ravens. Andreia Gandolfi [facility] 05/31/2011 12:13 AM

## 2011-05-29 NOTE — Progress Notes (Signed)
Lying quietly in bed with eyes closed.  No physical complaints or behavioral problems.  Q 15 min safety checks in progress. Safety maintained.

## 2011-05-29 NOTE — Progress Notes (Signed)
Patient ID: [name], male   DOB: 06/09/1970, 41 y.o.   MRN: [id number] 05/29/2011 Nursing [address] is seen OOB UAL on the 500 hall tolerated well today. He appears hesitant, anxious, quiet and remains ioslative, watching TV quietly in the group room most of the day. He refused his lidocaine pain patch as well as his libirum and this was charted. A  He  Completed his self invenotry this AM and on it he wrote that he cont to have " off and on" SI, that  He is " not sure" what he plans to do at DC, and he rates his feelings of depression and hope lessness " 6/7". R Safety is maintained na dPOC cont fostering therapeutic relationship PD RN [facility]

## 2011-05-29 NOTE — Progress Notes (Signed)
Attended morning group. Seemed to be in more pleasant mood as compared to yesterday. Stated that he slept well and that was doing well. Plan still remains to return home after d/c, and become reinvolved with church family as a support for maintaining his sobriety.

## 2011-05-29 NOTE — Progress Notes (Signed)
[facility] Group Notes:  (Counselor/Nursing/MHT/Case Management/Adjunct)  05/29/2011 1:18 PM   Type of Therapy:  Processing Group at 11:00 am  Participation Level: Minimum  Participation Quality:  Attended entire session and shared when asked directly  Affect: Flat   Cognitive:  Oriented  Insight:  Unknown  Engagement in Group:  Minimal  Engagement in Therapy:  Minimal  Modes of Intervention:  Exploration and support  Summary of Progress/Problems: Pt attended entire group yet was very quiet to point he only shared when directly asked.  When pt asked to share his feelings of today from list he was able to share he feels 'bored, envious, frustrated and lonely'.  [facility] Group Notes:  (Counselor/Nursing/MHT/Case Management/Adjunct)  05/29/2011 1:18 PM   Type of Therapy:  Counseling Group at 1:15 pm  Participation Level:  None  Participation Quality:  Asleep and uninterested   Ronda Fairly, LCSWA 05/29/2011 1:18 PM

## 2011-05-29 NOTE — Progress Notes (Signed)
[facility] Adult Inpatient Family/Significant Other Suicide Prevention Education  Suicide Prevention Education:  Patient Refusal for Family/Significant Other Suicide Prevention Education: The patient [name] has refused to provide written consent for family/significant other to be provided Family/Significant Other Suicide Prevention Education during admission and/or prior to discharge.  Physician notified. Writer provided suicide prevention education directly to patient; conversation included risk factors, warning signs and resources to contact for help. Mobile crisis services explained and contact card placed in chart for pt to receive at discharge.   [name] 05/29/2011, 5:39 PM
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
## 2011-05-30 NOTE — Progress Notes (Signed)
Patient ID: [name], male   DOB: 26-Feb-1971, 41 y.o.   MRN: [id number] 05-30-11 @ 1523 pt has complained of back pain today and rated it at a 9. RN spoke with pa and she said he is in detox and she would not give him any additional medications for pain. On his inventory sheet he wrote slept poor, appetite improving, energy normal attention good with depression and hopelessness at 7. Withdrawal symptoms are chilling. He denied any suicide ideation. Physical problems he listed as back pain and rated it at 9. When he goes home he plans to get back into church and continue to pray for healing and deliverance. He stated he didn't see any problems staying on medications after discharge. RN will monitor and safety checks continue every 15 minutes.

## 2011-05-30 NOTE — Progress Notes (Signed)
[facility] Group Notes:  (Counselor/Nursing/MHT/Case Management/Adjunct)  05/30/2011 1315  Type of Therapy:  Group Therapy  Participation Level:  Active  Participation Quality:  Appropriate, Attentive and Sharing  Affect:  Appropriate  Cognitive:  Appropriate  Insight:  Good  Engagement in Group:  Good  Engagement in Therapy:  Good  Modes of Intervention:  Clarification, Problem-solving, Socialization and Support  Summary of Progress/Problems: Pt attended and participate in group on Self-sabotage and enabling behaviors. Pt shared that his self-sabotaging belief is that because he has 2 felonies he will not be hired for a job. Pt was discouraged and felt that he did not have a chance to find employment based on his past. Pt could not explore ways to break free from his sabotaging belief, however pt was reminded that identifying self-sabotage is his first step towards framing his negative thoughts.   Arsenia Goracke 05/30/2011, 3:52 PM

## 2011-05-30 NOTE — Progress Notes (Signed)
Patient was seen by writer at the medication window and was informed that his lidoderm patch needed to be removed. Patinet c/o the pain medication available was not strong enough. Patient was offered heat pack and ibuprofen but patient refused both. Patient appeared somewhat anxious and slightly agitated at medications available, Clinical research associate apologized for this discomfort and encouraged patient to discuss this matter tomorrow with dr. Patient denies si/hi/a/v hall. Safety maintained, pt. was supported and encouraged. Will continue to monitor.

## 2011-05-31 DIAGNOSIS — F1910 Other psychoactive substance abuse, uncomplicated: Secondary | ICD-10-CM | POA: Diagnosis not present

## 2011-05-31 MED ORDER — METHOCARBAMOL 500 MG PO TABS
500.0000 mg | ORAL_TABLET | Freq: Four times a day (QID) | ORAL | Status: DC | PRN
  Administered 2011-05-31: 500 mg via ORAL
  Filled 2011-05-31: qty 1

## 2011-05-31 MED ORDER — ACETAMINOPHEN 325 MG PO TABS
650.0000 mg | ORAL_TABLET | Freq: Four times a day (QID) | ORAL | Status: DC | PRN
  Administered 2011-05-31: 650 mg via ORAL

## 2011-05-31 MED ORDER — TRAMADOL HCL 50 MG PO TABS
50.0000 mg | ORAL_TABLET | Freq: Four times a day (QID) | ORAL | Status: DC
  Administered 2011-05-31 – 2011-06-01 (×4): 50 mg via ORAL
  Filled 2011-05-31: qty 3
  Filled 2011-05-31 (×5): qty 1
  Filled 2011-05-31 (×3): qty 3
  Filled 2011-05-31 (×5): qty 1

## 2011-05-31 NOTE — Progress Notes (Signed)
Patient ID: [name], male   DOB: 06/06/1970, 41 y.o.   MRN: [id number]  [facility] Group Notes:  (Counselor/Nursing/MHT/Case Management/Adjunct)  05/31/2011 1:15 PM  Type of Therapy:  Group Therapy, Dance/Movement Therapy   Participation Level:  Active  Participation Quality:  Appropriate, Sharing and Supportive  Affect:  Appropriate  Cognitive:  Appropriate  Insight:  Limited  Engagement in Group:  Limited  Engagement in Therapy:  Limited  Modes of Intervention:  Clarification, Problem-solving, Role-play, Socialization and Support  Summary of Progress/Problems: pt participated in a group discussion on healthy and unhealthy choices and how to use support for themselves and support from other to achieve sobriety. Pt identified a goal that they would like to achieve in the next 7 days and made a plan to begin working on this goal today.  Gevena Mart

## 2011-05-31 NOTE — Progress Notes (Signed)
Patient ID: [name], male   DOB: [birth year], 41 y.o.   MRN: [id number] Pt. attended and participated in aftercare planning group. Pt. accepted information on suicide prevention, warning signs to look for with suicide and crisis line numbers to use. The pt. agreed to call crisis line numbers if having warning signs or having thoughts of suicide. Pt. listed their current feeling state as "better" but that he has lots of back pain.

## 2011-05-31 NOTE — Progress Notes (Signed)
Patient ID: [name], male   DOB: [birth year], 41 y.o.   MRN: [id number] 05-31-11 @ 1359 nursing shift note: pt has had back pain and complained of it this am. He stated his lidocaine patch and the naprosyn are not helping him. He refused is patch and also refused his librium this am.  He requested opiates and an order was not obtained. RN recommended he rest and stay comfortable. On his inventory sheet he wrote slept poor, appetite improving, energy low, attention good with his depression and hopelessness at 7. Withdrawal symptoms are chilling.  He denied any suicide ideation and listed physical problems as back pain., rated at 9.  Changes he plans to make when he gets home is "pray". He stated he didn't see any problems staying on his medications after discharge. RN will monitor and safety checks continue every 15 minutes.

## 2011-05-31 NOTE — Progress Notes (Signed)
Patient ID: [name], male   DOB: 23-Sep-1970, 41 y.o.   MRN: [id number] [name]  63 y.o.  [id number] [birth year]  05/31/2011   Diagnosis:  Alcohol withdrawal  Subjective: now febrile. Flu swab ordered.  Still complaining of severe back pain.  "all over" and says it "moves."   Objective:Vital Signs:Blood pressure 145/91, pulse 77, temperature 99.3 F (37.4 C), temperature source Oral, resp. rate 18, height 5\' 7"  (1.702 m), weight 132 lb (59.875 kg), SpO2 98.00%. Pt. Does not look or appear ill, or in discomfort. Still anticipating D/C in am.  Pt. Is cooperative A&Ox3, denies WD sx at this time.  Has good ROM of back, with no obvious physical deficits.  Assessment:  Febrile, Alcohol detox, back pain  Medications Scheduled . chlordiazePOXIDE  25 mg Oral Daily  . lidocaine  1 patch Transdermal Q24H  . mulitivitamin with minerals  1 tablet Oral Daily  . naproxen  500 mg Oral BID WC  . thiamine  100 mg Oral Daily  . traMADol  50 mg Oral Q6H   PRN Meds alum & mag hydroxide-simeth, ibuprofen, magnesium hydroxide  Plan: Tramadol 50mg  po q 6 hours for pain. Flu swab to r/o infectious disease. Tylenol for temp. Continue current plan of care with no changes at this time.  Anticipated D/C is in the AM.  Lloyd Huger T. Travas Schexnayder [facility] 05/31/2011 5:09 PM

## 2011-06-01 NOTE — Treatment Plan (Signed)
Interdisciplinary Treatment Plan Update (Adult)  Date: 06/01/2011  Time Reviewed: 9:27 AM   Progress in Treatment: Attending groups: Yes Participating in groups: Yes Taking medication as prescribed: Yes Tolerating medication: Yes   Family/Significant othe contact made:   Patient understands diagnosis:  Yes Discussing patient identified problems/goals with staff:  Yes Medical problems stabilized or resolved:  Yes Denies suicidal/homicidal ideation: Yes  Per self inventory Issues/concerns per patient self-inventory:  None noted Other:  New problem(s) identified: N/A  Reason for Continuation of Hospitalization: Other; describe D/C today  Interventions implemented related to continuation of hospitalization:   Additional comments:  Estimated length of stay:D/C today  Discharge Plan:Return home  Get involved with church  New goal(s): N/A  Review of initial/current patient goals per problem list:   1.  Goal(s):Detox from drugs, alcohol  Met:  Yes  Target date:  As evidenced by:  2.  Goal (s):Set up with PCP  Met:  Yes  Target date:  As evidenced by:  3.  Goal(s):  Met:  Yes  Target date:  As evidenced by:  4.  Goal(s):  Met:  Yes  Target date:  As evidenced by:  Attendees: Patient:  [name] 06/01/2011 9:27 AM  Family:     Physician:  Lupe Carney 06/01/2011 9:27 AM   Nursing: Neill Loft  06/01/2011 9:27 AM   Case Manager:  Richelle Ito, LCSW 06/01/2011 9:27 AM   Counselor:  Ronda Fairly, LCSWA 06/01/2011 9:27 AM   Other: Shelda Jakes  06/01/2011 9:27 AM  Other:     Other:     Other:      Scribe for Treatment Team:   Ida Rogue, 06/01/2011 9:27 AM

## 2011-06-01 NOTE — Progress Notes (Signed)
Patient ID: [name], male   DOB: 18-Jul-1970, 41 y.o.   MRN: [id number] Pt was discharged ambulatory to take bus home.  He is going to live with friends.  He denies SI/HI.  He is going to follow [facility] with [facility] and with his church community.  He is on no medications and is to go to [facility] for pain management.  He was given a suicide prevention pamphlet

## 2011-06-01 NOTE — [facility] Suicide Risk Assessment (Signed)
Suicide Risk Assessment  Discharge Assessment     Demographic factors:  See chart.  Current Mental Status:  Patient seen and evaluated in treatment team. Chart reviewed. Patient stated that his mood was "good". His affect was mood congruent and euthymic. He denied any current thoughts of self injurious behavior, suicidal ideation or homicidal ideation. He has continually contracted on the unit and "is ready to go". There were no auditory or visual hallucinations, paranoia, delusional thought processes, or mania noted.  Thought process was linear and goal directed.  No psychomotor agitation or retardation was noted. His speech was normal rate, tone and volume. Eye contact was good. Judgment and insight are fair.  Patient has been up and engaged on the unit.  No acute safety concerns reported from team.   Loss Factors:  Loss Factors: Financial problems / change in socioeconomic status  Historical Factors:  Historical Factors: Family history of mental illness or substance abuse; denied hx trauma; denied hx SI in past, plans or attempts.  No firearms at home per his report.  Convicted felon for poss drugs. Denied FamHx of suicide or mental illness.  FamHx SA.  Hx agitation.  Risk Reduction Factors:  Risk Reduction Factors: Living with another person, especially a relative; Church; "staying with friends"  CLINICAL FACTORS: Alcohol Dependence; Cannabis Abuse; r/o Psychotic Disorder NOS (AH in past)  COGNITIVE FEATURES THAT CONTRIBUTE TO RISK: limited insight; guarded  SUICIDE RISK: Pt viewed as a chronic moderate increased risk of harm to self and others in light of his past hx and risk factors.  No acute safety concerns noted on the unit.  Pt contracting for safety and is stable for discharge.   PLAN OF CARE: Pt stable for and requesting discharge. Pt contracting for safety and does not currently meet [facility] involuntary commitment criteria for continued hospitalization.  Mental health treatment,  medication management and continued sobriety will mitigate against the increased risk of harm to self and/or others.  Discussed the importance of recovery further with pt, as well as, tools to move forward in a healthy & safe manner.  Pt agreeable with the plan.  Discussed with the team.  Please see orders, follow up plans per team and full discharge summary to be completed by physician extender.   Lupe Carney 06/01/2011, 1:06 PM

## 2011-06-01 NOTE — Progress Notes (Signed)
[facility] Group Notes:  (Counselor/Nursing/MHT/Case Management/Adjunct)  06/01/2011 3:50 PM  Type of Therapy:  Group Therapy  Participation Level:  Minimal  Participation Quality:  Attentive  Affect:  Appropriate yet quiet  Cognitive:  Appropriate  Insight:  Limited  Engagement in Group:  Limited  Engagement in Therapy:  Limited  Modes of Intervention:  Clarification, Socialization and Support  Summary of Progress/Problems:  [name] came into group late and shared his biggest obstacle to recovery is the people he has chosen to spend his time with. Pt has basically two groups of friends with whom he shares time; one who is faith based and another who is involved in substance use. Patient was otherwise quiet.    [name] 06/01/2011, 3:54 PM

## 2011-06-01 NOTE — Progress Notes (Signed)
[facility] Case Management Discharge Plan:  Will you be returning to the same living situation after discharge: Yes,  with friends At discharge, do you have transportation home?:Yes,  bus Do you have the ability to pay for your medications:Yes,  no meds  Interagency Information:     Release of information consent forms completed and in the chart;  Patient's signature needed at discharge.  Patient to Follow up at:  Follow-up Information    Follow up with [facility]. (See paper given you by nurse that outlines process of referral to [facility])    Contact information:   [address] [place] [address] [phone]         Patient denies SI/HI:   Yes,  yes    Safety Planning and Suicide Prevention discussed:  Yes,  yes  Barrier to discharge identified:No.  Summary and Recommendations:   Ida Rogue 06/01/2011, 11:08 AM

## 2011-06-01 NOTE — Discharge Summary (Signed)
Physician Discharge Summary Note  Patient:  [name] is an 41 y.o., male MRN:  [id number] DOB:  10-05-1970 Patient phone:  [phone] (home)  Patient address:   [place] [address],   Date of Admission:  05/26/2011 Date of Discharge: 06/01/2011  Discharge Diagnoses: Principal Problem:  *Alcohol addiction Active Problems:  Polysubstance abuse   Axis Diagnosis:   AXIS I:  Alcohol Abuse AXIS II:  Deferred AXIS III:   Past Medical History  Diagnosis Date  . Depression    AXIS IV:  educational problems, occupational problems and problems with primary support group AXIS V:  51-60 moderate symptoms  Level of Care:  OP  Hospital Course:  Unremarkable.  [name] was not interested in participating in groups.  He did not want to consider rehab or further treatment for his alcohol addiction.  He complained of back pain for the duration of his stay and received a back exam by this examiner.  No acute problems could be identified.  It was noted that [name]'s complaint of pain were not consistent with his behavior or his ease of movement.  On the last day of his admission [name] developed a low grade fever of 99.3.  He was evaluated for influenza and his back pain was treated with Tramadol.  On the day of discharge [name]'s temperature had not increased and had returned to normal.  He stated that the Tramadol had helped his pain only a small amount, but he did request to be given some samples of Tramadol when he left.  Consults:  None  Significant Diagnostic Studies:  microbiology: nasal swab  Discharge Vitals:   Blood pressure 130/92, pulse 71, temperature 97.7 F (36.5 C), temperature source Oral, resp. rate 16, height 5\' 7"  (1.702 m), weight 59.875 kg (132 lb), SpO2 98.00%.  Mental Status Exam: See Mental Status Examination and Suicide Risk Assessment completed by Attending Physician prior to discharge.  Discharge destination:  Home  Is patient on multiple  antipsychotic therapies at discharge:  No   Has Patient had three or more failed trials of antipsychotic monotherapy by history:  No  Recommended Plan for Multiple Antipsychotic Therapies: Discharge Orders    Future Orders Please Complete By Expires   Diet - low sodium heart healthy      Increase activity slowly      Discharge instructions      Comments:   Continue to use supportive care for your back pain.  Keep your follow up appointment at [facility].     Medication List    Notice       You have not been prescribed any medications.              Follow-up recommendations:  Other:  Continue to use supportive treatment for your back pain until you can be seen by your MD.  Comments:  Avoid alcohol and narcotic pain meds.  Signed: Charmelle Soh 06/01/2011, 10:15 AM

## 2011-06-01 NOTE — Progress Notes (Signed)
Patient ID: [name], male   DOB: 02-13-1971, 41 y.o.   MRN: [id number] Has been out to dayroom attended group, rather quiet and depressed this evening but denies w/d sx.  Did c/o back discomfort, refused robaxin, requested tramadol and was given for pain he rated as an 8.  No other c/o's voiced, was going to bed.  Will continue to monitor.

## 2011-06-02 LAB — RESPIRATORY VIRUS PANEL
Adenovirus B: NOT DETECTED
Bocavirus: NOT DETECTED
Coronavirus229E: NOT DETECTED
CoronavirusHKU1: NOT DETECTED
CoronavirusOC43: NOT DETECTED
Influenza B: NOT DETECTED
Metapneumovirus: NOT DETECTED
Parainfluenza 1: NOT DETECTED
Parainfluenza 2: NOT DETECTED
Parainfluenza 3: NOT DETECTED
Parainfluenza 4: NOT DETECTED

## 2011-06-03 NOTE — Progress Notes (Signed)
Patient Discharge Instructions:  Admission Note Faxed,  06/02/2011 After Visit Summary Faxed,  06/02/2011 Faxed to the Next Level Care provider:  06/02/2011 D/C Summary Note faxed 06/02/2011 Facesheet faxed 06/02/2011   Faxed to [facility] @ [phone]  Wandra Scot, 06/03/2011, 11:51 AM

## 2011-06-04 ENCOUNTER — Encounter (HOSPITAL_COMMUNITY): Payer: Self-pay | Admitting: Emergency Medicine

## 2011-06-04 ENCOUNTER — Emergency Department (HOSPITAL_COMMUNITY)
Admission: EM | Admit: 2011-06-04 | Discharge: 2011-06-04 | Disposition: A | Discharge status: Home / Self Care | Payer: Self-pay | Attending: Emergency Medicine | Admitting: Emergency Medicine

## 2011-06-04 DIAGNOSIS — F1020 Alcohol dependence, uncomplicated: Secondary | ICD-10-CM | POA: Insufficient documentation

## 2011-06-04 DIAGNOSIS — R45851 Suicidal ideations: Secondary | ICD-10-CM | POA: Insufficient documentation

## 2011-06-04 LAB — RAPID URINE DRUG SCREEN, HOSP PERFORMED
Amphetamines: NOT DETECTED
Cocaine: NOT DETECTED
Opiates: NOT DETECTED
Tetrahydrocannabinol: NOT DETECTED

## 2011-06-04 LAB — COMPREHENSIVE METABOLIC PANEL
ALT: 22 U/L (ref 0–53)
AST: 29 U/L (ref 0–37)
Albumin: 4 g/dL (ref 3.5–5.2)
Chloride: 102 mEq/L (ref 96–112)
Creatinine, Ser: 0.87 mg/dL (ref 0.50–1.35)
Sodium: 141 mEq/L (ref 135–145)
Total Bilirubin: 0.3 mg/dL (ref 0.3–1.2)

## 2011-06-04 LAB — CBC
MCV: 95.1 fL (ref 78.0–100.0)
Platelets: 252 10*3/uL (ref 150–400)
RDW: 13.7 % (ref 11.5–15.5)
WBC: 8.6 10*3/uL (ref 4.0–10.5)

## 2011-06-04 MED ORDER — IBUPROFEN 600 MG PO TABS: 600.0000 mg | ORAL_TABLET | Freq: Three times a day (TID) | ORAL | Status: DC | PRN

## 2011-06-04 MED ORDER — LORAZEPAM 1 MG PO TABS: 1.0000 mg | ORAL_TABLET | Freq: Three times a day (TID) | ORAL | Status: DC | PRN

## 2011-06-04 MED ORDER — NICOTINE 21 MG/24HR TD PT24
21.0000 mg | MEDICATED_PATCH | Freq: Every day | TRANSDERMAL | Status: DC
  Filled 2011-06-04: qty 1

## 2011-06-04 MED ORDER — ONDANSETRON HCL 4 MG PO TABS: 4.0000 mg | ORAL_TABLET | Freq: Three times a day (TID) | ORAL | Status: DC | PRN

## 2011-06-04 NOTE — ED Notes (Signed)
ALP bedside to eval pt 

## 2011-06-04 NOTE — ED Provider Notes (Addendum)
Reassess this a.m. Presents for suicidal thoughts and hearing voices. Voices some mild low back pain for which she has Tylenol and ibuprofen ordered. There is no concern for cauda equina syndrome on examination. Awaiting further recommendations from psychiatry  Dayton Bailiff, MD 06/04/11 [phone]  Evaluated by tele-psych and is safe for discharge to home per psychiatry  Dayton Bailiff, MD 06/04/11 1130

## 2011-06-04 NOTE — Progress Notes (Signed)
This assessment/evaluation/note was completed by a Child psychotherapist of Social Work Warden/ranger and as Investment banker, corporate, I was immediately available for consultation/collaboration. I am in agreement with the contents and disposition reflected in the assessment/evaluation/note(s).   Ileene Hutchinson , MSW, LCSWA 06/04/2011 11:46 AM [phone]

## 2011-06-04 NOTE — ED Notes (Signed)
Psych ED notified of pt impending move, will await lab results prior to transfer, cont to monitor

## 2011-06-04 NOTE — ED Notes (Signed)
Pt assisted into paper scrubs, security notified to wand pt and belongings

## 2011-06-04 NOTE — ED Notes (Signed)
Pt. States that he doesn't eat pork, this was noted on his future diet requests.  Offered pt. Ceral, bkft bar, oj/coffee, pt. Declined.  Informed pt. To let us know if he wants any bkft.

## 2011-06-04 NOTE — ED Provider Notes (Signed)
History     CSN: [id number]  Arrival date & time 06/04/11  0126   First MD Initiated Contact with Patient 06/04/11 0128      Chief Complaint  Patient presents with  . Psychiatric Evaluation  . Suicidal  . Alcohol Intoxication    (Consider location/radiation/quality/duration/timing/severity/associated sxs/prior treatment) HPI Comments: Patient here with complaints of suicidal ideation without any specific plan - states that he was just discharged from [facility] and states that he thinks that he left too soon, also wants help with his drinking, states drinks about 18-24 beers daily and has had about 18 today.  Denies any history of DT's thought he admits to shakes.  Patient is a 41 y.o. male presenting with intoxication. The history is provided by the patient. No language interpreter was used.  Alcohol Intoxication This is a chronic problem. The current episode started yesterday. The problem occurs constantly. The problem has been unchanged. Pertinent negatives include no abdominal pain, anorexia, arthralgias, chest pain, chills, congestion, coughing, fatigue, fever, headaches, nausea, neck pain, rash, swollen glands, visual change, vomiting or weakness. The symptoms are aggravated by nothing. He has tried nothing for the symptoms. The treatment provided no relief.    Past Medical History  Diagnosis Date  . Depression     History reviewed. No pertinent past surgical history.  No family history on file.  History  Substance Use Topics  . Smoking status: Current Everyday Smoker -- 0.5 packs/day  . Smokeless tobacco: Not on file  . Alcohol Use: 50.4 oz/week    84 Cans of beer per week     heavy       Review of Systems  Constitutional: Negative for fever, chills and fatigue.  HENT: Negative for congestion and neck pain.   Respiratory: Negative for cough.   Cardiovascular: Negative for chest pain.  Gastrointestinal: Negative for nausea, vomiting, abdominal pain and  anorexia.  Musculoskeletal: Negative for arthralgias.  Skin: Negative for rash.  Neurological: Negative for weakness and headaches.  Psychiatric/Behavioral: Positive for suicidal ideas. Negative for self-injury.  All other systems reviewed and are negative.    Allergies  Review of patient's allergies indicates no known allergies.  Home Medications  No current outpatient prescriptions on file.  BP 144/87  Pulse 95  Temp(Src) 98.9 F (37.2 C) (Oral)  Resp 18  Wt 145 lb (65.772 kg)  SpO2 95%  Physical Exam  Nursing note and vitals reviewed. Constitutional: He is oriented to person, place, and time. He appears well-developed and well-nourished. No distress.  HENT:  Head: Normocephalic and atraumatic.  Right Ear: External ear normal.  Left Ear: External ear normal.  Nose: Nose normal.  Mouth/Throat: Oropharynx is clear and moist. No oropharyngeal exudate.  Eyes: Conjunctivae are normal. Pupils are equal, round, and reactive to light. No scleral icterus.  Neck: Normal range of motion. Neck supple.  Cardiovascular: Normal rate, regular rhythm and normal heart sounds.  Exam reveals no gallop and no friction rub.   No murmur heard. Pulmonary/Chest: Effort normal and breath sounds normal. He exhibits no tenderness.  Abdominal: Soft. Bowel sounds are normal. He exhibits no distension. There is no tenderness.  Musculoskeletal: Normal range of motion. He exhibits no edema and no tenderness.  Lymphadenopathy:    He has no cervical adenopathy.  Neurological: He is alert and oriented to person, place, and time. No cranial nerve deficit.  Skin: Skin is warm and dry. No rash noted. No erythema. No pallor.  Psychiatric: He has a normal  mood and affect. His behavior is normal. Judgment and thought content normal.    ED Course  Procedures (including critical care time)   Labs Reviewed  CBC  COMPREHENSIVE METABOLIC PANEL  ETHANOL  URINE RAPID DRUG SCREEN (HOSP PERFORMED)   No  results found.  Results for orders placed during the hospital encounter of 06/04/11  CBC      Component Value Range   WBC 8.6  4.0 - 10.5 (K/uL)   RBC 3.89 (*) 4.22 - 5.81 (MIL/uL)   Hemoglobin 12.3 (*) 13.0 - 17.0 (g/dL)   HCT 16.1 (*) 09.6 - 52.0 (%)   MCV 95.1  78.0 - 100.0 (fL)   MCH 31.6  26.0 - 34.0 (pg)   MCHC 33.2  30.0 - 36.0 (g/dL)   RDW 04.5  40.9 - 81.1 (%)   Platelets 252  150 - 400 (K/uL)  COMPREHENSIVE METABOLIC PANEL      Component Value Range   Sodium 141  135 - 145 (mEq/L)   Potassium 3.5  3.5 - 5.1 (mEq/L)   Chloride 102  96 - 112 (mEq/L)   CO2 27  19 - 32 (mEq/L)   Glucose, Bld 94  70 - 99 (mg/dL)   BUN 11  6 - 23 (mg/dL)   Creatinine, Ser 9.14  0.50 - 1.35 (mg/dL)   Calcium 9.3  8.4 - 78.2 (mg/dL)   Total Protein 7.7  6.0 - 8.3 (g/dL)   Albumin 4.0  3.5 - 5.2 (g/dL)   AST 29  0 - 37 (U/L)   ALT 22  0 - 53 (U/L)   Alkaline Phosphatase 50  39 - 117 (U/L)   Total Bilirubin 0.3  0.3 - 1.2 (mg/dL)   GFR calc non Af Amer >90  >90 (mL/min)   GFR calc Af Amer >90  >90 (mL/min)  ETHANOL      Component Value Range   Alcohol, Ethyl (B) 273 (*) 0 - 11 (mg/dL)  URINE RAPID DRUG SCREEN (HOSP PERFORMED)      Component Value Range   Opiates NONE DETECTED  NONE DETECTED    Cocaine NONE DETECTED  NONE DETECTED    Benzodiazepines NONE DETECTED  NONE DETECTED    Amphetamines NONE DETECTED  NONE DETECTED    Tetrahydrocannabinol NONE DETECTED  NONE DETECTED    Barbiturates NONE DETECTED  NONE DETECTED    No results found.  Alcohol intoxication Suicidal Ideation.   MDM  Patient just discharged from [facility] and returns via [facility] complaining of suicidal ideation again, though I doubt this is the case and immediately the patient then informs me that he is cold, homeless and hungry and would like a sandwich to eat.  He is clinically intoxicated at this time and we will move him to the psych ED to await him sobering up and then re-assess his suicidal intent at that time.   I  have spoken with the ACT team who will evaluate the patient around 10am once his alcohol level is down and he is more clinically sober.    Izola Price [place], [place] 06/04/11 [phone]

## 2011-06-04 NOTE — ED Notes (Signed)
Pt alert, presents via [employer], SI, denies plan, denies HI, recent stressful family situation, resp even unlabored, skin pwd

## 2011-06-04 NOTE — Progress Notes (Deleted)
Patient evaluated by telepsych with recommendation for d/c home. Discussed with Dr. Brooke Dare. Social Worker to provide patient with shelter information due to concerns about possible future homelessness. Patient states that he has been staying with friends and will plan to return there. EDP is aware and plans d/c today.  [name], SW Intern 06/04/2011 11:39 AM
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
## 2011-06-04 NOTE — Progress Notes (Signed)
Patient was evaluated by telepsych with recommendation for d/c home. Dr. Brooke Dare was given telepsych report and will plan d/c home today. Patient states he stays with friends and will plan to return there. Social worker to provide list of shelters and community resources for mental health follow up. Bradly Bienenstock, SW Intern 06/04/2011 11:44 AM

## 2011-06-04 NOTE — ED Provider Notes (Signed)
Medical screening examination/treatment/procedure(s) were performed by non-physician practitioner and as supervising physician I was immediately available for consultation/collaboration.   Masayo Fera M Mannat Benedetti, MD 06/04/11 0706 

## 2011-06-06 ENCOUNTER — Encounter (HOSPITAL_COMMUNITY): Payer: Self-pay | Admitting: *Deleted

## 2011-06-06 ENCOUNTER — Inpatient Hospital Stay (HOSPITAL_COMMUNITY)
Admission: RE | Admit: 2011-06-06 | Discharge: 2011-06-13 | DRG: 885 | Disposition: A | Discharge status: Other Acute Inpatient Hospital | Payer: PRIVATE HEALTH INSURANCE | Source: Ambulatory Visit | Attending: General Surgery | Admitting: General Surgery

## 2011-06-06 ENCOUNTER — Emergency Department (HOSPITAL_COMMUNITY)
Admission: EM | Admit: 2011-06-06 | Discharge: 2011-06-06 | Disposition: A | Discharge status: Other Acute Inpatient Hospital | Payer: PRIVATE HEALTH INSURANCE | Attending: Emergency Medicine | Admitting: Emergency Medicine

## 2011-06-06 ENCOUNTER — Emergency Department (HOSPITAL_COMMUNITY): Payer: PRIVATE HEALTH INSURANCE

## 2011-06-06 DIAGNOSIS — M545 Low back pain, unspecified: Secondary | ICD-10-CM | POA: Insufficient documentation

## 2011-06-06 DIAGNOSIS — R45851 Suicidal ideations: Secondary | ICD-10-CM | POA: Insufficient documentation

## 2011-06-06 DIAGNOSIS — R198 Other specified symptoms and signs involving the digestive system and abdomen: Secondary | ICD-10-CM

## 2011-06-06 DIAGNOSIS — R269 Unspecified abnormalities of gait and mobility: Secondary | ICD-10-CM | POA: Insufficient documentation

## 2011-06-06 DIAGNOSIS — M5137 Other intervertebral disc degeneration, lumbosacral region: Secondary | ICD-10-CM

## 2011-06-06 DIAGNOSIS — F17200 Nicotine dependence, unspecified, uncomplicated: Secondary | ICD-10-CM | POA: Insufficient documentation

## 2011-06-06 DIAGNOSIS — F329 Major depressive disorder, single episode, unspecified: Secondary | ICD-10-CM | POA: Insufficient documentation

## 2011-06-06 DIAGNOSIS — X58XXXA Exposure to other specified factors, initial encounter: Secondary | ICD-10-CM

## 2011-06-06 DIAGNOSIS — Z560 Unemployment, unspecified: Secondary | ICD-10-CM

## 2011-06-06 DIAGNOSIS — R197 Diarrhea, unspecified: Secondary | ICD-10-CM

## 2011-06-06 DIAGNOSIS — F1020 Alcohol dependence, uncomplicated: Secondary | ICD-10-CM

## 2011-06-06 DIAGNOSIS — F1210 Cannabis abuse, uncomplicated: Secondary | ICD-10-CM | POA: Insufficient documentation

## 2011-06-06 DIAGNOSIS — F3289 Other specified depressive episodes: Secondary | ICD-10-CM | POA: Insufficient documentation

## 2011-06-06 DIAGNOSIS — M51379 Other intervertebral disc degeneration, lumbosacral region without mention of lumbar back pain or lower extremity pain: Secondary | ICD-10-CM

## 2011-06-06 DIAGNOSIS — F333 Major depressive disorder, recurrent, severe with psychotic symptoms: Principal | ICD-10-CM | POA: Diagnosis present

## 2011-06-06 DIAGNOSIS — S2239XA Fracture of one rib, unspecified side, initial encounter for closed fracture: Secondary | ICD-10-CM | POA: Diagnosis not present

## 2011-06-06 DIAGNOSIS — F1010 Alcohol abuse, uncomplicated: Secondary | ICD-10-CM | POA: Insufficient documentation

## 2011-06-06 DIAGNOSIS — IMO0002 Reserved for concepts with insufficient information to code with codable children: Secondary | ICD-10-CM

## 2011-06-06 DIAGNOSIS — F1021 Alcohol dependence, in remission: Secondary | ICD-10-CM | POA: Diagnosis present

## 2011-06-06 HISTORY — DX: Major depressive disorder, single episode, severe with psychotic features: F32.3

## 2011-06-06 HISTORY — DX: Anemia, unspecified: D64.9

## 2011-06-06 HISTORY — DX: Alcohol abuse, uncomplicated: F10.10

## 2011-06-06 LAB — RAPID URINE DRUG SCREEN, HOSP PERFORMED
Amphetamines: NOT DETECTED
Barbiturates: NOT DETECTED
Benzodiazepines: POSITIVE — AB
Cocaine: NOT DETECTED
Opiates: NOT DETECTED
Tetrahydrocannabinol: NOT DETECTED

## 2011-06-06 LAB — COMPREHENSIVE METABOLIC PANEL WITH GFR
ALT: 20 U/L (ref 0–53)
AST: 29 U/L (ref 0–37)
CO2: 32 meq/L (ref 19–32)
Chloride: 103 meq/L (ref 96–112)
Creatinine, Ser: 0.93 mg/dL (ref 0.50–1.35)
GFR calc Af Amer: >90 mL/min (ref >90)
GFR calc non Af Amer: >90 mL/min (ref >90)
Glucose, Bld: 104 mg/dL — ABNORMAL HIGH (ref 70–99)
Sodium: 145 meq/L (ref 135–145)
Total Bilirubin: 0.3 mg/dL (ref 0.3–1.2)

## 2011-06-06 LAB — COMPREHENSIVE METABOLIC PANEL
Albumin: 4.5 g/dL (ref 3.5–5.2)
Alkaline Phosphatase: 59 U/L (ref 39–117)
BUN: 7 mg/dL (ref 6–23)
Calcium: 9.8 mg/dL (ref 8.4–10.5)
Potassium: 4.1 mEq/L (ref 3.5–5.1)
Total Protein: 8.7 g/dL — ABNORMAL HIGH (ref 6.0–8.3)

## 2011-06-06 LAB — CBC
HCT: 41.4 % (ref 39.0–52.0)
Hemoglobin: 14.1 g/dL (ref 13.0–17.0)
MCH: 32 pg (ref 26.0–34.0)
MCHC: 34.1 g/dL (ref 30.0–36.0)
MCV: 93.9 fL (ref 78.0–100.0)
Platelets: 309 K/uL (ref 150–400)
RBC: 4.41 MIL/uL (ref 4.22–5.81)
RDW: 13.6 % (ref 11.5–15.5)
WBC: 8.1 K/uL (ref 4.0–10.5)

## 2011-06-06 LAB — ETHANOL: Alcohol, Ethyl (B): 339 mg/dL — ABNORMAL HIGH (ref 0–11)

## 2011-06-06 MED ORDER — LOPERAMIDE HCL 2 MG PO CAPS: 2.0000 mg | ORAL_CAPSULE | ORAL | Status: DC | PRN

## 2011-06-06 MED ORDER — LORAZEPAM 1 MG PO TABS: 0.0000 mg | ORAL_TABLET | Freq: Four times a day (QID) | ORAL | Status: DC

## 2011-06-06 MED ORDER — ONDANSETRON HCL 4 MG PO TABS: 4.0000 mg | ORAL_TABLET | Freq: Three times a day (TID) | ORAL | Status: DC | PRN

## 2011-06-06 MED ORDER — ZOLPIDEM TARTRATE 5 MG PO TABS: 5.0000 mg | ORAL_TABLET | Freq: Every evening | ORAL | Status: DC | PRN

## 2011-06-06 MED ORDER — LORAZEPAM 1 MG PO TABS: 1.0000 mg | ORAL_TABLET | Freq: Four times a day (QID) | ORAL | Status: DC | PRN

## 2011-06-06 MED ORDER — HYDROXYZINE HCL 25 MG PO TABS: 25.0000 mg | ORAL_TABLET | Freq: Four times a day (QID) | ORAL | Status: DC | PRN

## 2011-06-06 MED ORDER — ONDANSETRON 4 MG PO TBDP: 4.0000 mg | ORAL_TABLET | Freq: Four times a day (QID) | ORAL | Status: DC | PRN

## 2011-06-06 MED ORDER — ALUM & MAG HYDROXIDE-SIMETH 200-200-20 MG/5ML PO SUSP: 30.0000 mL | ORAL | Status: DC | PRN

## 2011-06-06 MED ORDER — CHLORDIAZEPOXIDE HCL 25 MG PO CAPS: 25.0000 mg | ORAL_CAPSULE | Freq: Every day | ORAL | Status: DC

## 2011-06-06 MED ORDER — TRAMADOL HCL 50 MG PO TABS
50.0000 mg | ORAL_TABLET | Freq: Four times a day (QID) | ORAL | Status: DC | PRN
  Administered 2011-06-06 – 2011-06-09 (×5): 50 mg via ORAL
  Filled 2011-06-06 (×5): qty 1

## 2011-06-06 MED ORDER — CHLORDIAZEPOXIDE HCL 25 MG PO CAPS: 25.0000 mg | ORAL_CAPSULE | Freq: Four times a day (QID) | ORAL | Status: DC | PRN

## 2011-06-06 MED ORDER — TRAZODONE HCL 50 MG PO TABS
50.0000 mg | ORAL_TABLET | Freq: Every day | ORAL | Status: DC
  Administered 2011-06-07: 50 mg via ORAL
  Filled 2011-06-06 (×2): qty 1

## 2011-06-06 MED ORDER — ACETAMINOPHEN 325 MG PO TABS: 650.0000 mg | ORAL_TABLET | ORAL | Status: DC | PRN

## 2011-06-06 MED ORDER — SODIUM CHLORIDE 0.9 % IV BOLUS (SEPSIS)
1000.0000 mL | Freq: Once | INTRAVENOUS | Status: AC
  Administered 2011-06-06: 1000 mL via INTRAVENOUS

## 2011-06-06 MED ORDER — THIAMINE HCL 100 MG/ML IJ SOLN: 100.0000 mg | Freq: Every day | INTRAMUSCULAR | Status: DC

## 2011-06-06 MED ORDER — IBUPROFEN 200 MG PO TABS: 600.0000 mg | ORAL_TABLET | Freq: Three times a day (TID) | ORAL | Status: DC | PRN

## 2011-06-06 MED ORDER — VITAMIN B-1 100 MG PO TABS
100.0000 mg | ORAL_TABLET | Freq: Every day | ORAL | Status: DC
  Administered 2011-06-06: 100 mg via ORAL
  Filled 2011-06-06: qty 1

## 2011-06-06 MED ORDER — MAGNESIUM HYDROXIDE 400 MG/5ML PO SUSP: 30.0000 mL | Freq: Every day | ORAL | Status: DC | PRN

## 2011-06-06 MED ORDER — LORAZEPAM 1 MG PO TABS: 0.0000 mg | ORAL_TABLET | Freq: Two times a day (BID) | ORAL | Status: DC

## 2011-06-06 MED ORDER — FOLIC ACID 1 MG PO TABS
1.0000 mg | ORAL_TABLET | Freq: Every day | ORAL | Status: DC
  Administered 2011-06-06: 1 mg via ORAL
  Filled 2011-06-06: qty 1

## 2011-06-06 MED ORDER — CHLORDIAZEPOXIDE HCL 25 MG PO CAPS: 25.0000 mg | ORAL_CAPSULE | Freq: Three times a day (TID) | ORAL | Status: DC

## 2011-06-06 MED ORDER — ADULT MULTIVITAMIN W/MINERALS CH
1.0000 | ORAL_TABLET | Freq: Every day | ORAL | Status: DC
  Administered 2011-06-07 – 2011-06-12 (×6): 1 via ORAL
  Filled 2011-06-06 (×7): qty 1

## 2011-06-06 MED ORDER — ADULT MULTIVITAMIN W/MINERALS CH
1.0000 | ORAL_TABLET | Freq: Every day | ORAL | Status: DC
  Administered 2011-06-06: 1 via ORAL
  Filled 2011-06-06: qty 1

## 2011-06-06 MED ORDER — ACETAMINOPHEN 325 MG PO TABS: 650.0000 mg | ORAL_TABLET | Freq: Four times a day (QID) | ORAL | Status: DC | PRN

## 2011-06-06 MED ORDER — VITAMIN B-1 100 MG PO TABS
100.0000 mg | ORAL_TABLET | Freq: Every day | ORAL | Status: DC
  Administered 2011-06-07 – 2011-06-08 (×2): 100 mg via ORAL
  Filled 2011-06-06 (×3): qty 1

## 2011-06-06 MED ORDER — CHLORDIAZEPOXIDE HCL 25 MG PO CAPS: 25.0000 mg | ORAL_CAPSULE | ORAL | Status: DC

## 2011-06-06 MED ORDER — LORAZEPAM 2 MG/ML IJ SOLN: 1.0000 mg | Freq: Four times a day (QID) | INTRAMUSCULAR | Status: DC | PRN

## 2011-06-06 MED ORDER — CHLORDIAZEPOXIDE HCL 25 MG PO CAPS
25.0000 mg | ORAL_CAPSULE | Freq: Four times a day (QID) | ORAL | Status: DC
  Administered 2011-06-07 (×2): 25 mg via ORAL
  Filled 2011-06-06 (×2): qty 1

## 2011-06-06 MED ORDER — THIAMINE HCL 100 MG/ML IJ SOLN: 100.0000 mg | Freq: Once | INTRAMUSCULAR | Status: DC

## 2011-06-06 NOTE — ED Notes (Signed)
-     SPOKE WITH Mr. Alben Spittle,  RN @ [facility] WHOM STATED THAT AT ETOH LEVEL OF 167;    PT WAS STILL TOO INTOXICATED.    NURSE WILL NOT ACCEPT THIS PT UNTIL HIS LEVEL IS A BIT MORE DECREASED.     MD IS AWARE.

## 2011-06-06 NOTE — ED Notes (Signed)
Patient is resting comfortably. 

## 2011-06-06 NOTE — ED Notes (Signed)
Pt is heavily intoxicated w/ ETOH. Pt states he just left [facility], in [facility] for SI and continues to be suicidal.

## 2011-06-06 NOTE — ED Notes (Signed)
Vital signs stable. 

## 2011-06-06 NOTE — Tx Team (Signed)
Initial Interdisciplinary Treatment Plan  PATIENT STRENGTHS: (choose at least two) Ability for insight Motivation for treatment/growth Physical Health  PATIENT STRESSORS: Financial difficulties Substance abuse   PROBLEM LIST: Problem List/Patient Goals Date to be addressed Date deferred Reason deferred Estimated date of resolution  Depression 06/06/2011     +SI 06/06/2011     Relapse on alcohol 06/06/2011                                          DISCHARGE CRITERIA:  Ability to meet basic life and health needs Improved stabilization in mood, thinking, and/or behavior Reduction of life-threatening or endangering symptoms to within safe limits Withdrawal symptoms are absent or subacute and managed without 24-hour nursing intervention  PRELIMINARY DISCHARGE PLAN: Attend aftercare/continuing care group Attend 12-step recovery group  PATIENT/FAMIILY INVOLVEMENT: This treatment plan has been presented to and reviewed with the patient, [name], and/or family member.  The patient and family have been given the opportunity to ask questions and make suggestions.  Lawrence Marseilles 06/06/2011, 9:20 PM

## 2011-06-06 NOTE — ED Provider Notes (Signed)
History     CSN: [id number]  Arrival date & time 06/06/11  0529   First MD Initiated Contact with Patient 06/06/11 [phone]      Chief Complaint  Patient presents with  . Medical Clearance  . Suicidal  . Alcohol Intoxication    (Consider location/radiation/quality/duration/timing/severity/associated sxs/prior treatment) Patient is a 41 y.o. male presenting with intoxication. The history is provided by the patient (nursing staff). History Limited By: intoxication.  Alcohol Intoxication This is a chronic problem. Episode onset: an unknown time ago. The problem occurs daily. The problem has been unchanged.  Per triage nurse, pt presented with EtOH on board requesting medical clearance and expressing SI. At the time of assessment, his intoxication limits his ability to answer questions and I cannot glean any further history.  Past Medical History  Diagnosis Date  . Depression   . Alcohol abuse   . Anemia     History reviewed. No pertinent past surgical history.  History reviewed. No pertinent family history.  History  Substance Use Topics  . Smoking status: Current Everyday Smoker -- 0.5 packs/day  . Smokeless tobacco: Not on file  . Alcohol Use: 50.4 oz/week    84 Cans of beer per week     heavy       Review of Systems  Unable to perform ROS: Other  Intoxicated  Allergies  Review of patient's allergies indicates no known allergies.  Home Medications  No current outpatient prescriptions on file.  BP 115/77  Pulse 93  Temp(Src) 97.8 F (36.6 C) (Oral)  Resp 16  SpO2 96%  Physical Exam  Constitutional: He appears well-developed and well-nourished. He is sleeping. No distress.  HENT:  Head: Normocephalic and atraumatic.  Right Ear: External ear normal.  Left Ear: External ear normal.  Nose: Nose normal.  Eyes: Pupils are equal, round, and reactive to light.  Neck: Neck supple.  Cardiovascular: Normal rate, regular rhythm, normal heart sounds and intact distal  pulses.   Pulmonary/Chest: Effort normal and breath sounds normal. No respiratory distress. He has no wheezes.  Abdominal: Soft. Bowel sounds are normal. He exhibits no distension. There is no tenderness.  Musculoskeletal: He exhibits no edema and no tenderness.  Lymphadenopathy:    He has no cervical adenopathy.  Neurological:       Pt sleeping and is somewhat arousable to loud spoken voice, does not coherently answer any questions.  Skin: Skin is warm and dry. No rash noted.    ED Course  Procedures (including critical care time)  Labs Reviewed  COMPREHENSIVE METABOLIC PANEL - Abnormal; Notable for the following:    Glucose, Bld 104 (*)    Total Protein 8.7 (*)    All other components within normal limits  ETHANOL - Abnormal; Notable for the following:    Alcohol, Ethyl (B) 339 (*)    All other components within normal limits  CBC  URINE RAPID DRUG SCREEN (HOSP PERFORMED)   No results found.   No diagnosis found.    MDM  7:15 Pt assessed. Intoxication limits ability to obtain HPI and ROS. Given alcohol level of 339 to explain LOC and no known head injury, no CT head ordered at this time- will re-assess to monitor improvement and determine need for further testing.   8:59 AM Pt is more easily aroused to spoken voice. He complains of mild low back pain with no injury- denies fever, HIV, IV drug use, incontinence, saddle anesthesia, leg numbness/weakness. He reports "I want to die" but  denies having any plan. 10 systems are now reviewed and are otherwise negative for acute change. Reports daily alcohol consumption as well as occasional marijuana use. Falling asleep frequently during examination. Will re-assess shortly.   10:38 AM Pt awakens, answers questions appropriately. Very unsteady gait. ACT counselor has been consulted, will speak with pt. Plan to tele-psych when pt is more able to cooperate with examination.      3:41 PM Care discussed with EDP Ghim, who will  continue to follow-up on Tele-psych and ACT recommendations.  Shaaron Adler, [place] 06/06/11 1541

## 2011-06-06 NOTE — ED Notes (Signed)
Patient denies pain and is resting comfortably.  
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
## 2011-06-06 NOTE — ED Provider Notes (Signed)
Act indicates pt accepted to [facility], dr readling, bed ready.  Pt alert, content, no tremor or shakes.   Suzi Roots, MD 06/06/11 [phone]

## 2011-06-06 NOTE — ED Provider Notes (Signed)
Medical screening examination/treatment/procedure(s) were conducted as a shared visit with non-physician practitioner(s) and myself.  I personally evaluated the patient during the encounter  Intoxicated male with suicidal ideation.  No evidence of trauma.  Glynn Octave, MD 06/06/11 1640

## 2011-06-06 NOTE — ED Notes (Signed)
Patient transported to CT 
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
## 2011-06-07 MED ORDER — MIRTAZAPINE 15 MG PO TBDP
15.0000 mg | ORAL_TABLET | Freq: Every day | ORAL | Status: DC
  Administered 2011-06-07: 15 mg via ORAL
  Filled 2011-06-07 (×2): qty 1

## 2011-06-07 MED ORDER — GABAPENTIN 600 MG PO TABS
300.0000 mg | ORAL_TABLET | Freq: Three times a day (TID) | ORAL | Status: DC
  Administered 2011-06-07 – 2011-06-08 (×3): 300 mg via ORAL
  Filled 2011-06-07: qty 0.5
  Filled 2011-06-07: qty 12
  Filled 2011-06-07: qty 4
  Filled 2011-06-07 (×3): qty 0.5
  Filled 2011-06-07: qty 2
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
## 2011-06-07 NOTE — [facility] Suicide Risk Assessment (Addendum)
Suicide Risk Assessment  Admission Assessment     Demographic factors:  Assessment Details Time of Assessment: Admission Information Obtained From: Patient Current Mental Status:  Current Mental Status: Suicidal ideation indicated by patient Loss Factors:  Loss Factors: Financial problems / change in socioeconomic status Historical Factors:  Historical Factors: Family history of mental illness or substance abuse Risk Reduction Factors:  Risk Reduction Factors: Living with another person, especially a relative  CLINICAL FACTORS:   Severe Anxiety and/or Agitation Dysthymia Alcohol/Substance Abuse/Dependencies  COGNITIVE FEATURES THAT CONTRIBUTE TO RISK:  Loss of executive function    SUICIDE RISK:   Minimal: No identifiable suicidal ideation.  Patients presenting with no risk factors but with morbid ruminations; may be classified as minimal risk based on the severity of the depressive symptoms  PLAN OF CARE:   He admits to alcohol abuse: 2-3 12 packs/day.  He has been drinking since 41 y.o.  He has AH and     Had suicidal thoughts when he came to [facility].  He says he has a mood 7/10 worst mood.  The tramadol he was getting has helped some.  He begins to tell how he fell and has L upper quadrant pain.  He is referred to meeting his psychiatrist Monday.  He is homeless and has been living friend-friend.  He is angry because he was denies SSD.  He says his best recovery effort is "Delieverance".  He claims he is going to go back to church. Pt needs referral to rehab services, therapy, psychiatrist and medications. (NB: statement re: drug possession is in error)    Kortnee Bas 42/07/2011, 4:55 PM

## 2011-06-07 NOTE — Progress Notes (Signed)
[facility] Adult Inpatient Family/Significant Other Suicide Prevention Education  Suicide Prevention Education:  Patient Refusal for Family/Significant Other Suicide Prevention Education: The patient [name] has refused to provide written consent for family/significant other to be provided Family/Significant Other Suicide Prevention Education during admission and/or prior to discharge.  Physician notified.  Pt. accepted information on suicide prevention, warning signs to look for with suicide and crisis line numbers to use. The pt. agreed to call crisis line numbers if having warning signs or having thoughts of suicide.    [facility] 06/07/2011, 2:38 PM

## 2011-06-07 NOTE — Progress Notes (Signed)
Pt was up in dayroom upon first assessment.  He was given his self-inventory to fill out.  He rated his depression a 6, hopelessness a 5 and anxiety he denied.  He denied any A/V hallucinations.  He admits to having some passive S/I now but does contract for safety on the unit.  During his medication pass, he at first did not want any librium however, once he was told what it was ordered for he stated,"no, give it to me but I want to have the tramadol for my pain"  He was given 50 mg of tramadol at 0846 he rated 8 out of 10 and upon reassessment at 0945 he stated,"it is a 6 that medication really doesn't help much but I take it anyway".  He denies any signs or symptoms of withdrawal thus far.  He did go to group this morning and has remained up in dayroom with peers.

## 2011-06-07 NOTE — Progress Notes (Signed)
Adult Psychosocial Assessment Update Interdisciplinary Team  [facility] admissions/discharges:  Admissions Discharges  Date:  05/26/11 Date: 06/04/11  Date: Date:  Date: Date:  Date: Date:  Date: Date:   Changes since the last Psychosocial Assessment (including adherence to outpatient mental health and/or substance abuse treatment, situational issues contributing to decompensation and/or relapse). Pt. Indicated that he was having suicidal thoughts and that he sometimes hears voices   in his head that tells him that he is worthless and meaningless.           Discharge Plan 1. Will you be returning to the same living situation after discharge?   Yes: No:      If no, what is your plan?     Pt. indicated that he was homeless and is living with various friends.  He doesn't really   A place to go and doesn't know if he will be returning to the same living situation currently.     2. Would you like a referral for services when you are discharged? Yes:     If yes, for what services?  No:         Pt. Indicated that he would not like to return to homeless shelters but would like    Referrals to long term facilities that can aid him with alcohol addiction     Summary and Recommendations (to be completed by the evaluator) Recommendations include crisis stabilization, case management, medication management, psych education groups to teach coping skills and group therapy.                        Signature:  Rhunette Croft, 06/07/2011 2:42 PM

## 2011-06-07 NOTE — Progress Notes (Signed)
Patient ID: [name], male   DOB: [birth year], 41 y.o.   MRN: [id number]   [facility] Group Notes:  (Counselor/Nursing/MHT/Case Management/Adjunct)  06/07/2011 11 AM  Type of Therapy:  Group Therapy, Dance/Movement Therapy   Participation Level:  Active  Participation Quality:  Appropriate  Affect:  Appropriate  Cognitive:  Oriented  Insight:  Limited  Engagement in Group:  Good  Engagement in Therapy:  Good  Modes of Intervention:  Clarification, Problem-solving, Role-play, Socialization and Support  Summary of Progress/Problems:  Group focused on what makes a external support healthy or unhealthy. The group then practiced how to show others the ways in which they need to be supported, as well as how to give themselves support. Pt reported that he was feeling somewhat awake and somewhat sleepy. Pt would participate in the group process periodically. He would lean forward, observing, and sometimes snicker. This indicates some ambivalence in approaching his recovery.    Thomasena Edis, [employer]

## 2011-06-08 ENCOUNTER — Inpatient Hospital Stay (HOSPITAL_COMMUNITY): Payer: PRIVATE HEALTH INSURANCE

## 2011-06-08 ENCOUNTER — Inpatient Hospital Stay (HOSPITAL_COMMUNITY)
Admission: RE | Admit: 2011-06-08 | Discharge: 2011-06-08 | Disposition: A | Discharge status: Home / Self Care | Payer: PRIVATE HEALTH INSURANCE | Source: Ambulatory Visit | Attending: Psychiatry | Admitting: Psychiatry

## 2011-06-08 DIAGNOSIS — F1021 Alcohol dependence, in remission: Secondary | ICD-10-CM | POA: Diagnosis present

## 2011-06-08 DIAGNOSIS — F333 Major depressive disorder, recurrent, severe with psychotic symptoms: Secondary | ICD-10-CM | POA: Diagnosis present

## 2011-06-08 DIAGNOSIS — F1020 Alcohol dependence, uncomplicated: Secondary | ICD-10-CM | POA: Diagnosis present

## 2011-06-08 MED ORDER — CHLORDIAZEPOXIDE HCL 25 MG PO CAPS
25.0000 mg | ORAL_CAPSULE | Freq: Once | ORAL | Status: AC
Start: 2011-06-08 — End: 2011-06-08
  Administered 2011-06-08: 25 mg via ORAL
  Filled 2011-06-08: qty 1

## 2011-06-08 MED ORDER — IBUPROFEN 800 MG PO TABS
800.0000 mg | ORAL_TABLET | ORAL | Status: DC
  Administered 2011-06-08 – 2011-06-10 (×6): 800 mg via ORAL
  Filled 2011-06-08 (×8): qty 1

## 2011-06-08 MED ORDER — GABAPENTIN 300 MG PO CAPS
300.0000 mg | ORAL_CAPSULE | Freq: Three times a day (TID) | ORAL | Status: DC
Start: 2011-06-08 — End: 2011-06-08
  Administered 2011-06-08: 300 mg via ORAL
  Filled 2011-06-08 (×4): qty 1

## 2011-06-08 MED ORDER — HYDROXYZINE HCL 25 MG PO TABS: 25.0000 mg | ORAL_TABLET | Freq: Four times a day (QID) | ORAL | Status: DC | PRN

## 2011-06-08 MED ORDER — SERTRALINE HCL 50 MG PO TABS
50.0000 mg | ORAL_TABLET | Freq: Every day | ORAL | Status: DC
  Administered 2011-06-08 – 2011-06-10 (×3): 50 mg via ORAL
  Filled 2011-06-08 (×5): qty 1

## 2011-06-08 MED ORDER — LOPERAMIDE HCL 2 MG PO CAPS
2.0000 mg | ORAL_CAPSULE | ORAL | Status: AC | PRN
  Administered 2011-06-10: 2 mg via ORAL
  Administered 2011-06-10: 4 mg via ORAL
  Administered 2011-06-10: 2 mg via ORAL
  Filled 2011-06-08 (×2): qty 1

## 2011-06-08 MED ORDER — THIAMINE HCL 100 MG/ML IJ SOLN
100.0000 mg | Freq: Once | INTRAMUSCULAR | Status: AC
  Administered 2011-06-08: 100 mg via INTRAMUSCULAR

## 2011-06-08 MED ORDER — CHLORDIAZEPOXIDE HCL 25 MG PO CAPS
25.0000 mg | ORAL_CAPSULE | Freq: Every day | ORAL | Status: AC
  Administered 2011-06-12: 25 mg via ORAL
  Filled 2011-06-08 (×2): qty 1

## 2011-06-08 MED ORDER — CHLORDIAZEPOXIDE HCL 25 MG PO CAPS: 25.0000 mg | ORAL_CAPSULE | Freq: Four times a day (QID) | ORAL | Status: AC | PRN

## 2011-06-08 MED ORDER — TRAZODONE HCL 100 MG PO TABS
100.0000 mg | ORAL_TABLET | Freq: Every day | ORAL | Status: DC
  Administered 2011-06-08 – 2011-06-10 (×3): 100 mg via ORAL
  Filled 2011-06-08 (×4): qty 1

## 2011-06-08 MED ORDER — CHLORDIAZEPOXIDE HCL 25 MG PO CAPS
25.0000 mg | ORAL_CAPSULE | Freq: Three times a day (TID) | ORAL | Status: AC
  Administered 2011-06-10 (×3): 25 mg via ORAL
  Filled 2011-06-08 (×4): qty 1

## 2011-06-08 MED ORDER — VITAMIN B-1 100 MG PO TABS
100.0000 mg | ORAL_TABLET | Freq: Every day | ORAL | Status: DC
  Administered 2011-06-09 – 2011-06-13 (×5): 100 mg via ORAL
  Filled 2011-06-08 (×7): qty 1

## 2011-06-08 MED ORDER — CHLORDIAZEPOXIDE HCL 25 MG PO CAPS
25.0000 mg | ORAL_CAPSULE | ORAL | Status: AC
  Administered 2011-06-11 (×2): 25 mg via ORAL
  Filled 2011-06-08: qty 1

## 2011-06-08 MED ORDER — ADULT MULTIVITAMIN W/MINERALS CH: 1.0000 | ORAL_TABLET | Freq: Every day | ORAL | Status: DC

## 2011-06-08 MED ORDER — ONDANSETRON 4 MG PO TBDP: 4.0000 mg | ORAL_TABLET | Freq: Four times a day (QID) | ORAL | Status: AC | PRN

## 2011-06-08 MED ORDER — CHLORDIAZEPOXIDE HCL 25 MG PO CAPS
25.0000 mg | ORAL_CAPSULE | Freq: Four times a day (QID) | ORAL | Status: AC
  Administered 2011-06-08 – 2011-06-09 (×6): 25 mg via ORAL
  Filled 2011-06-08 (×5): qty 1

## 2011-06-08 MED ORDER — HYDROXYZINE HCL 25 MG PO TABS: 25.0000 mg | ORAL_TABLET | Freq: Four times a day (QID) | ORAL | Status: AC | PRN

## 2011-06-08 MED ORDER — GABAPENTIN 600 MG PO TABS
600.0000 mg | ORAL_TABLET | ORAL | Status: DC
  Administered 2011-06-08 – 2011-06-12 (×11): 600 mg via ORAL
  Filled 2011-06-08 (×18): qty 1

## 2011-06-08 MED ORDER — RISPERIDONE 1 MG PO TABS
1.0000 mg | ORAL_TABLET | Freq: Every day | ORAL | Status: DC
  Administered 2011-06-08 – 2011-06-09 (×2): 1 mg via ORAL
  Filled 2011-06-08 (×3): qty 1

## 2011-06-08 NOTE — Progress Notes (Signed)
Met with pt 1:1 who reports feeling better than on admit 24 hours ago. His main concern at present is obtaining quality sleep tonight. Explained to pt new med changes; discontinuation of librium protocol per PA's note indicating pt does not require formal detox due to recent discharge. Pt agreeable to take new order of neurontin and remeron as well as prn dose of trazadone. He endorses passive SI but is able to contract for safety. No HI. Denies voices are "softer than before." Pt is in dayroom and is safe. Lawrence Marseilles

## 2011-06-08 NOTE — Progress Notes (Signed)
Recreation Therapy Notes  06/08/2011         Time: 0930      Group Topic/Focus: The focus of this group is on enhancing the patient's understanding of leisure, barriers to leisure, and the importance of engaging in positive leisure activities upon discharge for improved total health.  Participation Level: Active  Participation Quality: Appropriate and Attentive  Affect: Appropriate  Cognitive: Oriented   Additional Comments: Patient only wished to talk about using sex as his leisure activity.   Idonna Heeren 06/08/2011 10:04 AM

## 2011-06-08 NOTE — Progress Notes (Signed)
In dayroom watching TV with peers on approach. Calm and cooperative with assessment. No acute distress noted. States he has had a bad day r/t w/d symptoms (shakiness>no observable shakiness when hands held out and cravings), pain and AH. States he doesn't feel like he is getting enough pain medicine and was resistive to Motrin as he feels it wont help. Support and encouragement provided. Writer educated pt on using NSAIDS and the need for "around the clock" frequency over days to achieve appreciable pain reduction. Reluctantly took dose. Did c/o [id number] telling him to get it over with, why are you wasting time here. Does contract for safety. States he enjoyed groups though and he found them uplifting. Denies HI and VH. Went for [employer] and states everything went well and he tolerated procedure. Otherwise no questions or concerns expressed. POC and medications for the shift reviewed and understanding verbalized. Safety has been maintained with Q15 minute observation. Will f/u response to pain medication and continue current POC.

## 2011-06-08 NOTE — Progress Notes (Signed)
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 Alcohol Dependence.   The patient was seen today and reports the following:   ADL's: Intact.  Sleep: The patient reports to sleeping better last night with Trazodone but with some difficulty sleeping before admission.  Appetite: The patient report a decreased appetite.   Mild>(1-10) >Severe  Hopelessness (1-10): 7  Depression (1-10): 6  Anxiety (1-10): 4   Suicidal Ideation: The patient reports episodic suicidal ideations with no plan or intent.  Plan: No  Intent: No  Means: No   Homicidal Ideation: The patient adamantly denies any homicidal ideations today.  Plan: No  Intent: No.  Means: No   General Appearance /Behavior: Casual and cooperative. Somewhat unkempt.  Eye Contact: Fair to Good.  Speech: Appropriate in rate and volume with no pressuring noted.  Motor Behavior: Appropriate.  Level of Consciousness: Alert and Oriented x 3.  Mental Status: Alert and Oriented x 3.  Mood: Moderately Depressed.  Affect: Essentially Flat.  Anxiety Level: Mild to Moderately anxious.  Thought Process: Auditory hallucinations remain.  Thought Content: The patient reports auditory hallucinations instructing him to harm himself.  He denies any visual hallucinations or delusional thinking. Perception:. Some auditory hallucinations remain.  Judgment: Fair.  Insight: Fair.  Cognition: Oriented to time, place and person.  Sleep:  Number of Hours: 5.75    Vital Signs:Blood pressure 132/96, pulse 66, temperature 97 F (36.1 C), temperature source Oral, resp. rate 20, height 5\' 8"  (1.727 m), weight 64.864 kg (143 lb). Current Medications: Current Facility-Administered Medications  Medication Dose Route Frequency Provider Last Rate Last Dose  . acetaminophen (TYLENOL) tablet 650 mg  650 mg Oral Q6H PRN Mickeal Skinner, MD      . alum & mag hydroxide-simeth  (MAALOX/MYLANTA) 200-200-20 MG/5ML suspension 30 mL  30 mL Oral Q4H PRN Mickeal Skinner, MD      . chlordiazePOXIDE (LIBRIUM) capsule 25 mg  25 mg Oral Q6H PRN Franchot Gallo, MD      . chlordiazePOXIDE (LIBRIUM) capsule 25 mg  25 mg Oral Once Franchot Gallo, MD      . chlordiazePOXIDE (LIBRIUM) capsule 25 mg  25 mg Oral QID Franchot Gallo, MD       Followed by  . chlordiazePOXIDE (LIBRIUM) capsule 25 mg  25 mg Oral TID Franchot Gallo, MD       Followed by  . chlordiazePOXIDE (LIBRIUM) capsule 25 mg  25 mg Oral BH-qamhs Zhavia Cunanan, MD       Followed by  . chlordiazePOXIDE (LIBRIUM) capsule 25 mg  25 mg Oral Daily Chrisy Hillebrand, MD      . gabapentin (NEURONTIN) tablet 600 mg  600 mg Oral [id number] Evany Schecter, MD      . hydrOXYzine (ATARAX/VISTARIL) tablet 25 mg  25 mg Oral Q6H PRN Franchot Gallo, MD      . ibuprofen (ADVIL,MOTRIN) tablet 800 mg  800 mg Oral [id number] Geniene List, MD      . loperamide (IMODIUM) capsule 2-4 mg  2-4 mg Oral PRN Franchot Gallo, MD      . magnesium hydroxide (MILK OF MAGNESIA) suspension 30 mL  30 mL Oral Daily PRN Mickeal Skinner, MD      . mulitivitamin with minerals tablet 1 tablet  1 tablet Oral Daily Franchot Gallo, MD   1 tablet at 06/08/11 0816  . ondansetron (ZOFRAN-ODT) disintegrating tablet 4 mg  4 mg Oral Q6H PRN  Franchot Gallo, MD      . sertraline (ZOLOFT) tablet 50 mg  50 mg Oral Daily Murrel Bertram, MD      . thiamine (B-1) injection 100 mg  100 mg Intramuscular Once Franchot Gallo, MD      . thiamine (VITAMIN B-1) tablet 100 mg  100 mg Oral Daily Mailin Coglianese, MD      . traMADol (ULTRAM) tablet 50 mg  50 mg Oral Q6H PRN Mickeal Skinner, MD   50 mg at 06/08/11 0819  . traZODone (DESYREL) tablet 100 mg  100 mg Oral QHS Franchot Gallo, MD      . DISCONTD: chlordiazePOXIDE (LIBRIUM) capsule 25 mg  25 mg Oral Q6H PRN Mickeal Skinner, MD      . DISCONTD: chlordiazePOXIDE (LIBRIUM) capsule 25 mg  25 mg Oral QID Mickeal Skinner, MD   25 mg at  06/07/11 1246  . DISCONTD: chlordiazePOXIDE (LIBRIUM) capsule 25 mg  25 mg Oral TID Mickeal Skinner, MD      . DISCONTD: chlordiazePOXIDE (LIBRIUM) capsule 25 mg  25 mg Oral BH-qamhs Mickeal Skinner, MD      . DISCONTD: chlordiazePOXIDE (LIBRIUM) capsule 25 mg  25 mg Oral Daily Mickeal Skinner, MD      . DISCONTD: gabapentin (NEURONTIN) capsule 300 mg  300 mg Oral TID AC & HS Franchot Gallo, MD   300 mg at 06/08/11 1255  . DISCONTD: gabapentin (NEURONTIN) tablet 300 mg  300 mg Oral TID AC & HS Mickie D. Adams, PA   300 mg at 06/08/11 1610  . DISCONTD: hydrOXYzine (ATARAX/VISTARIL) tablet 25 mg  25 mg Oral Q6H PRN Mickeal Skinner, MD      . DISCONTD: hydrOXYzine (ATARAX/VISTARIL) tablet 25 mg  25 mg Oral Q6H PRN Franchot Gallo, MD      . DISCONTD: loperamide (IMODIUM) capsule 2-4 mg  2-4 mg Oral PRN Mickeal Skinner, MD      . DISCONTD: mirtazapine (REMERON SOL-TAB) disintegrating tablet 15 mg  15 mg Oral QHS Mickie D. Adams, PA   15 mg at 06/07/11 2146  . DISCONTD: mulitivitamin with minerals tablet 1 tablet  1 tablet Oral Daily Franchot Gallo, MD      . DISCONTD: ondansetron (ZOFRAN-ODT) disintegrating tablet 4 mg  4 mg Oral Q6H PRN Mickeal Skinner, MD      . DISCONTD: thiamine (B-1) injection 100 mg  100 mg Intramuscular Once Mickeal Skinner, MD      . DISCONTD: thiamine (VITAMIN B-1) tablet 100 mg  100 mg Oral Daily Mickeal Skinner, MD   100 mg at 06/08/11 0816  . DISCONTD: traZODone (DESYREL) tablet 50 mg  50 mg Oral QHS Mickeal Skinner, MD   50 mg at 06/07/11 2146   Lab Results:  Results for orders placed during the hospital encounter of 06/06/11 (from the past 48 hour(s))  ETHANOL     Status: Abnormal   Collection Time   06/06/11  4:00 PM      Component Value Range Comment   Alcohol, Ethyl (B) 169 (*) 0 - 11 (mg/dL)   URINE RAPID DRUG SCREEN (HOSP PERFORMED)     Status: Abnormal   Collection Time   06/06/11  4:01 PM      Component Value Range Comment   Opiates NONE DETECTED  NONE DETECTED      Cocaine NONE DETECTED  NONE DETECTED     Benzodiazepines POSITIVE (*) NONE DETECTED     Amphetamines NONE DETECTED  NONE DETECTED     Tetrahydrocannabinol NONE DETECTED  NONE DETECTED     Barbiturates NONE DETECTED  NONE DETECTED     Time was spent today discussing with the patient the situation leading to his admission.  The patient reports to drinking between 24 and 36 beers a day as well as hearing voices telling him to kill himself.  He also reports ongoing pain issues and financial constraints.  Treatment Plan Summary:  1. Daily contact with patient to assess and evaluate symptoms and progress in treatment  2. Medication management  3. The patient will deny suicidal ideations or homicidal ideations for 48 hours prior to discharge and have a depression and anxiety rating of 3 or less. The patient will also deny any auditory or visual hallucinations or delusional thinking.  4. The patient will deny any symptoms of substance withdrawal at time of discharge.   Plan:  1. Will start Zoloft 50 mgs po q am for depression. 2. Will start Risperdal 1 mg po qhs for psychosis. 3. Will start Librium Detox for alcohol withdrawal. 4. Will start Ibuprofen 800 mgs po q am, 2 pm and hs for pain. 5. Will increase Neurontin to 600 mgs po q am, 2 pm and hs for pain and anxiety. 6. Will order a chest and left sided ribs x-ray to evaluate recent injury. 7. Will order a MRI without Contrast of Lumbar Spine to evaluate past back injury with resulted reported pain. 8. Continue to monitor.  Veleria Barnhardt 06/08/2011, 3:00 PM

## 2011-06-08 NOTE — Discharge Planning (Signed)
Met with patient in Aftercare Planning Group.   He reported that he was having some withdrawal symptoms, and CM encouraged him to share with doctor and nurse.  Although it was reported at his intake that he is homeless, he states that he lives with friends.  His follow-up is supposed to be with [facility], but he admits that he has not yet made it there after his last discharge.  Patient reports that he has "crazy" voices and "bad" thoughts that bother him mostly at night.  He has been to [facility] in the past, "years ago" and although he might be interested in going to a program like that again to gain skills for sobriety, he wants to first go home and get his clothing together, because "it is spread all over the place."  No case management needs today.  Ambrose Mantle, LCSW 06/08/2011, 12:28 PM

## 2011-06-08 NOTE — Progress Notes (Signed)
Pt was up in dayroom upon first assessment.  He rated his depression a 4 hopelessness a 6 and anxiety a 5.  He denied any voices since "last night" and still had some passive S/I but does contract for safety on the unit.  During morning medication pass, discussed with him if he was having any symptoms of withdrawal.  He admitted to agitation, cravings and chills but when asked about any GI symptoms he stated,"no".  During treatment team, Dr. Allena Katz told the team that pt admitted to having n/v and pt was told at that time if he had any diarrhea or vomitus that staff would need to see.  Pt did voice understanding and has not complained of any thus far or since.  Pt did c/o of back and rib pain and has an order for CXR and MRI to be done this evening and scheduled ibuprofen to go along with his prn tramadol which he had a dose of tramadol at 0819 this morning.  Dr. Allena Katz has added risperdal 1 mg at bedtime tonight and was restarted back on the librium protocol.  He voiced understanding to all new orders.

## 2011-06-08 NOTE — Tx Team (Signed)
Interdisciplinary Treatment Plan Update (Adult)  Date:  06/08/2011  Time Reviewed:  10:15AM-11:00AM  Progress in Treatment: Attending groups:  Yes Participating in groups:    Yes Taking medication as prescribed:   Yes, no refusals Tolerating medication:   Yes, no side effects have been reported by patient or noted by staff Family/Significant other contact made:   Patient understands diagnosis:   Yes Discussing patient identified problems/goals with staff:   Yes Medical problems stabilized or resolved:   Is in withdrawal from alcohol, fell prior to admission and feels something is wrong with his rib Denies suicidal/homicidal ideation:  No, off and on still Issues/concerns per patient self-inventory:   Side hurts Other:  New problem(s) identified: No, Describe:    Reason for Continuation of Hospitalization: Depression Hallucinations Medical Issues Medication stabilization Suicidal ideation Withdrawal symptoms  Interventions implemented related to continuation of hospitalization:  Medication monitoring and adjustment, safety checks Q15 min., suicide risk assessment, group therapy, psychoeducation, collateral contact, aftercare planning, ongoing physician assessments, medication education  Additional comments:  Withdrawal symptoms noted, lack of sleep, loss of appetite  Estimated length of stay:  3-4 days  Discharge Plan:  To be determined whether to go to RTF; however likely will go to friends' house and follow up at [facility].  New goal(s):  #5 - Safely complete detox protocol; #6 - Return sleep and appetite to normal level.  Review of initial/current patient goals per problem list:   1.  Goal(s):  Return psychotic symptoms (AH) to baseline.  Met:  No  Target date:  By Discharge   As evidenced by:  Voices there at night, command to hurt self  2.  Goal(s):  Deny SI for 48 hours prior to discharge.  Met:  No  Target date:  By Discharge   As evidenced by:  Feels suicidal  off and on today  3.  Goal(s):  Decrease depression and hopelessness from 7-8 at admission to no more than 3 at discharge.  Met:  No  Target date:  By Discharge   As evidenced by:  "6" today for depression, "7" for hopelessness  4.  Goal(s):  Decide if and how to address to substance abuse issues at discharge.  Met:  No  Target date:  By Discharge   As evidenced by:  Would like to go to a program, but not straight from the hospital, as he wants to go to his home and elsewhere to gather his clothing first; treatment team suggests him going to [facility] for groups  5.  Goal(s):  Safely complete detox protocol.  Met: No  Target date:  By Discharge   As evidenced by:  Protocol had been discontinued but doctor will restart this  6.  Goal(s):  Return sleep and appetite to normal level.  Met:  No  Target date:  By Discharge   As evidenced by:  Feels sleep improved last night with Trazodone.   Attendees: Patient:  [name]  06/08/2011 10:30AM  Family:     Physician:  Dr. Harvie Heck Readling 06/08/2011 10:30AM  Nursing:   Robbie Louis, RN 06/08/2011 10:30AM    Case Manager:  Ambrose Mantle, LCSW 06/08/2011 10:30AM  Counselor:  Veto Kemps, MT-BC 06/08/2011 10:30AM  Other:   Lynann Bologna, NP 06/08/2011 10:30AM  Other:      Other:      Other:       \ .,.[id number] zxc 1q`q 1`2Scribe for Treatment Team:   Sarina Ser, 06/08/2011, 12:07 PM

## 2011-06-09 MED ORDER — RISPERIDONE 1 MG PO TABS: 1.0000 mg | ORAL_TABLET | Freq: Four times a day (QID) | ORAL | Status: DC | PRN

## 2011-06-09 MED ORDER — HYDROCODONE-ACETAMINOPHEN 7.5-500 MG/15ML PO SOLN: 5.0000 mL | ORAL | Status: DC | PRN

## 2011-06-09 MED ORDER — HYDROCODONE-ACETAMINOPHEN 5-325 MG PO TABS
1.0000 | ORAL_TABLET | Freq: Four times a day (QID) | ORAL | Status: DC | PRN
  Administered 2011-06-09 – 2011-06-11 (×4): 1 via ORAL
  Filled 2011-06-09 (×4): qty 1

## 2011-06-09 NOTE — Progress Notes (Signed)
Pt has been in bed most of the day.  He has not attended any groups today and even discussed with NP the reason he does not want to attend groups on the [facility].  He claims it has something to do with some discussion about Christ and Christianity over there that he felt was "disrespectful".  One of his peers reported and gave this nurse an old cigarette butt that had already been lit and smoked at some point and claimed that this pt gave it to her.  She made another accusation that she knew he had some type of "drugs" on him.  There was an order to search him and his room which revealed no contraband.  Pt tolerated this well he did not argue and was agreeable to the search.  He did have an order for a rib belt and ortho came to fit him and he was put on prn vicodin and his tramadol was d/c'd.  NP added a prn risperdal if needed for agitation or psychosis which pt has not required today.  Pt is still rating his depression a 4 and hopelessness a 5 along with his anxiety a 5 too.  He denied any voices today but did admit he heard them last night.  He still having thoughts suicide but contracts for safety on the unit.  He is denying any physical symptoms of withdrawal except for some anxiety and "chilling".  No d/c plan at this time.
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
## 2011-06-09 NOTE — Progress Notes (Signed)
[facility] Group Notes:  (Counselor/Nursing/MHT/Case Management/Adjunct)  06/09/2011 11:10 AM  Type of Therapy:  Group Therapy  Participation Level:  Did Not Attend   Veto Kemps 06/09/2011, 11:10 AM
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
## 2011-06-10 MED ORDER — CELECOXIB 100 MG PO CAPS
100.0000 mg | ORAL_CAPSULE | ORAL | Status: DC
  Administered 2011-06-10 – 2011-06-13 (×5): 100 mg via ORAL
  Filled 2011-06-10 (×11): qty 1

## 2011-06-10 MED ORDER — RISPERIDONE 2 MG PO TABS
2.0000 mg | ORAL_TABLET | Freq: Every day | ORAL | Status: DC
  Administered 2011-06-10 – 2011-06-11 (×2): 2 mg via ORAL
  Filled 2011-06-10 (×2): qty 1

## 2011-06-10 MED ORDER — SERTRALINE HCL 100 MG PO TABS
100.0000 mg | ORAL_TABLET | Freq: Every day | ORAL | Status: DC
  Administered 2011-06-11: 100 mg via ORAL
  Filled 2011-06-10 (×3): qty 1

## 2011-06-10 NOTE — Progress Notes (Signed)
In Dayroom having snack and watching TV with peers. Appears flat and depressed. Calm and cooperative with assessment. No acute distress noted. States he has had a bad day r/t frequent episodes of diarrhea and some increase in pain. Support and encouragement provided. Offered prn for diarrhea and it was accepted. It was too early for a pain prn, so we adjusted his rib binder (it was actually wrapped around his lower abdomen) to see if that improved his pain and potential help with his GI distress. Also provided with a pitcher of Gatorade.  Recalled events from yesterday and how he felt like his AH may get worse when he gets upset. States he has AH this morning but he didn't feel agitated then. Currently denies SI/HI/AVH and contracts for safety. POC and medications for the shift reviewed and understanding verbalized. Safety has been maintained with Q15 minute observation. Will continue to monitor GI distress and pain and continue current POC.

## 2011-06-10 NOTE — Progress Notes (Signed)
Patient ID: [name], male   DOB: 1970-06-06, 41 y.o.   MRN: [id number]   Patient drowsy on approach this am. Denies depressed mood or any SI at this time. Denies a/ v hallucinations thus far this am. Took meds this am but thought he wasn't suppose to get them until 0930. Appropriate thus far today. Staff will monitor an encourage group attendance.

## 2011-06-10 NOTE — Progress Notes (Signed)
[facility] Group Notes:  (Counselor/Nursing/MHT/Case Management/Adjunct)  06/10/2011 1:43 PM  Type of Therapy:  Psychoeducational Skills  Participation Level:  Did Not Attend   [name] 06/10/2011, 1:43 PM

## 2011-06-10 NOTE — Progress Notes (Signed)
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  Alcohol Dependence.   The patient was seen today and reports the following:   ADL's: Intact.  Sleep: The patient reports to having continued difficulty initiating and maintaining sleep. Appetite: The patient report a decreased appetite.   Mild>(1-10) >Severe  Hopelessness (1-10): 7  Depression (1-10): 4  Anxiety (1-10): 5  Suicidal Ideation: The patient reports episodic suicidal ideations with no plan or intent.  Plan: No  Intent: No  Means: No   Homicidal Ideation: The patient adamantly denies any homicidal ideations today.  Plan: No  Intent: No.  Means: No   General Appearance /Behavior: Casual and cooperative.  Eye Contact: Good.  Speech: Appropriate in rate and volume with no pressuring noted.  Motor Behavior: Appropriate.  Level of Consciousness: Alert and Oriented x 3.  Mental Status: Alert and Oriented x 3.  Mood: Moderately Depressed.  Affect: Essentially Flat.  Anxiety Level: Moderately anxious.  Thought Process: Auditory hallucinations remain.  Thought Content: The patient reports auditory hallucinations instructing him to harm himself. He denies any visual hallucinations or delusional thinking.  Perception:. Some auditory hallucinations remain.  Judgment: Fair.  Insight: Fair.  Cognition: Oriented to time, place and person.  Sleep:  Number of Hours: 6.25    Vital Signs:Blood pressure 113/73, pulse 67, temperature 97.4 F (36.3 C), temperature source Oral, resp. rate 18, height 5\' 8"  (1.727 m), weight 64.864 kg (143 lb).  Current Medications: Current Facility-Administered Medications  Medication Dose Route Frequency Provider Last Rate Last Dose  . acetaminophen (TYLENOL) tablet 650 mg  650 mg Oral Q6H PRN Mickeal Skinner, MD      . alum & mag hydroxide-simeth (MAALOX/MYLANTA) 200-200-20 MG/5ML suspension 30 mL  30 mL Oral Q4H  PRN Mickeal Skinner, MD      . chlordiazePOXIDE (LIBRIUM) capsule 25 mg  25 mg Oral Q6H PRN Franchot Gallo, MD      . chlordiazePOXIDE (LIBRIUM) capsule 25 mg  25 mg Oral QID Franchot Gallo, MD   25 mg at 06/09/11 2149   Followed by  . chlordiazePOXIDE (LIBRIUM) capsule 25 mg  25 mg Oral TID Franchot Gallo, MD   25 mg at 06/10/11 1140   Followed by  . chlordiazePOXIDE (LIBRIUM) capsule 25 mg  25 mg Oral BH-qamhs Irene Mitcham, MD       Followed by  . chlordiazePOXIDE (LIBRIUM) capsule 25 mg  25 mg Oral Daily Radley Barto, MD      . gabapentin (NEURONTIN) tablet 600 mg  600 mg Oral [id number] Arianah Torgeson, MD   600 mg at 06/10/11 1343  . HYDROcodone-acetaminophen (NORCO) 5-325 MG per tablet 1 tablet  1 tablet Oral Q6H PRN Viviann Spare, NP   1 tablet at 06/09/11 1348  . hydrOXYzine (ATARAX/VISTARIL) tablet 25 mg  25 mg Oral Q6H PRN Franchot Gallo, MD      . ibuprofen (ADVIL,MOTRIN) tablet 800 mg  800 mg Oral BH-[id number] Dinna Severs, MD   800 mg at 06/10/11 1343  . loperamide (IMODIUM) capsule 2-4 mg  2-4 mg Oral PRN Franchot Gallo, MD   4 mg at 06/10/11 1342  . magnesium hydroxide (MILK OF MAGNESIA) suspension 30 mL  30 mL Oral Daily PRN Mickeal Skinner, MD      . mulitivitamin with minerals tablet 1 tablet  1 tablet Oral Daily Franchot Gallo, MD   1 tablet at 06/10/11 [id number]  . ondansetron (  ZOFRAN-ODT) disintegrating tablet 4 mg  4 mg Oral Q6H PRN Franchot Gallo, MD      . risperiDONE (RISPERDAL) tablet 1 mg  1 mg Oral QHS Viviann Spare, NP   1 mg at 06/09/11 2150  . risperiDONE (RISPERDAL) tablet 1 mg  1 mg Oral Q6H PRN Viviann Spare, NP      . sertraline (ZOLOFT) tablet 50 mg  50 mg Oral Daily Franchot Gallo, MD   50 mg at 06/10/11 1191  . thiamine (VITAMIN B-1) tablet 100 mg  100 mg Oral Daily Franchot Gallo, MD   100 mg at 06/10/11 4782  . traZODone (DESYREL) tablet 100 mg  100 mg Oral QHS Franchot Gallo, MD   100 mg at 06/09/11 2150   Time was spent today discussing with the  patient the findings of a broken rib.  The patient reports that he is in a great deal of pain particularly if he takes a deep breath or sneezes.  The patient reports that he would like to use the minimal amount of narcotics possible but would like his pain under better control.  IMPRESSION:  Left ninth rib fracture, minimally-displaced, without pneumothorax  or other complicating feature.  Original Report Authenticated By: Osa Craver, M.D.  Findings: Scan extends from T11-12 through S4. Tip of the conus is  at L1 and appears normal. Paraspinal soft tissues are normal.  T11-12 through L3-4: Normal.  L4-5: There is a focal soft disc protrusion into the right neural  foramen without impingement on the exiting right L4 nerve. There  is no impingement upon the thecal sac or lateral recess.  L5 S1: There is a tiny bulge of the disc central and slightly to  the right with no impingement upon the thecal sac or nerves.  There is no spinal or foraminal stenosis. No facet arthritis.   IMPRESSION:  Small soft disc protrusion into the right neural foramen at L4-5  with no neural impingement.  Minimal degenerative disc disease at L5-S1.  Otherwise, normal exam.  Original Report Authenticated By: Gwynn Burly, M.D.  Treatment Plan Summary:  1. Daily contact with patient to assess and evaluate symptoms and progress in treatment  2. Medication management  3. The patient will deny suicidal ideations or homicidal ideations for 48 hours prior to discharge and have a depression and anxiety rating of 3 or less. The patient will also deny any auditory or visual hallucinations or delusional thinking.  4. The patient will deny any symptoms of substance withdrawal at time of discharge.   Plan:  1. Will increase Zoloft to 100 mgs po q am for depression.  2. Will increase Risperdal to 2 mg po qhs for psychosis.  3. Will continue Librium Detox for alcohol withdrawal.  4. Will start Celebrex 100  mgs po q am and hs for pain.  5. Will discontinue Ibuprofen. 6. Continue to monitor.  Lanah Steines 06/10/2011, 2:08 PM

## 2011-06-10 NOTE — Progress Notes (Signed)
Recreation Therapy Notes  06/10/2011         Time: 0930      Group Topic/Focus: The focus of the group is on enhancing the patients' ability to cope with stressors by understanding what coping is, why it is important, the negative effects of stress and developing healthier coping skills.   Participation Level: Minimal  Participation Quality: Attentive  Affect: Blunted  Cognitive: Oriented   Additional Comments: Patient complaining of pain, participated minimally.   [name] 06/10/2011 10:20 AM

## 2011-06-10 NOTE — Tx Team (Signed)
Interdisciplinary Treatment Plan Update (Adult)  Date: 06/11/2011   Time Reviewed:  10:28 AM   Progress in Treatment: Attending groups:   Yes   Participating in groups:  Yes Taking medication as prescribed:  Yes Tolerating medication:  Yes Family/Significant othe contact made: Counselor to assess for contact with family Patient understands diagnosis:  Yes Discussing patient identified problems/goals with staff: Yes Medical problems stabilized or resolved: Yes Denies suicidal/homicidal ideation:Yes Issues/concerns per patient self-inventory:  Other:  New problem(s) identified:  Reason for Continuation of Hospitalization: Anxiety Depression Medical Issues Medication stabilization Suicidal ideation  Interventions implemented related to continuation of hospitalization:  Medication Management; safety checks q 15 mins  Additional comments:  Estimated length of stay: 2-4 days  Discharge Plan: [name] advised he rotates stays with various friend - He will follow up with [facility]  New goal(s):  Review of initial/current patient goals per problem list:    1.  Goal(s):  Reduce psychotic (AH) to baseline  Met:  No  Target date: d/c  As evidenced by:  Patient not endorsing AH today  2.  Goal (s):  Denies SI  Met:  No - [name] continues to endorse off/on SI but contracts for safety  Target date: d/c  As evidenced by:  [name] will no longer endorse SI or will have SI with no intent or plan  3.  Goal(s):  Decrease depression/hopelessness  Met:  No - Currently rates depression at four and  helplessness at ten  Target date: d/c  As evidenced by:  Will rate symptoms less three or below  4.  Goal(s):  Address issue related to substance abuse  Met:  Yes  Target date: d/c  As evidenced by:  Deferred due to patient he is not interested in residential treatment  Attendees: Patient:     Family:     Physician:  Franchot Gallo, MD  2/7/201310:27 AM    Nursing:    Amie Critchley, RN 06/11/2011  10:27 AM  CaseManager: Reyes Ivan, LCWA 06/11/2011 10:27 AM  Counselor:  Veto Kemps, Counselor 06/11/2011 10:28 AM  Other:  Silverio Decamp, RN 06/11/2011  10:28 AM  Other:    Other:     Other:      Scribe for Treatment Team:   Wynn Banker, LCSW, 06/11/2011  10:28 AM

## 2011-06-10 NOTE — Progress Notes (Signed)
[facility] Group Notes:  (Counselor/Nursing/MHT/Case Management/Adjunct)  06/10/2011 1:41 PM  Type of Therapy:  Group Therapy  Participation Level:  None  Participation Quality:  Drowsy  Affect:  Depressed  Cognitive:  Oriented  Insight:  Limited  Engagement in Group:  None  Engagement in Therapy:  None  Modes of Intervention:  Problem-solving, education   Summary of Progress/Problems: Patient stated that he was in a lot of pain. He was sedated from medications. Patient left group early to meet with staff.   Jyren Cerasoli, Aram Beecham 06/10/2011, 1:41 PM

## 2011-06-11 LAB — BASIC METABOLIC PANEL
CO2: 26 mEq/L (ref 19–32)
Calcium: 9.6 mg/dL (ref 8.4–10.5)
Creatinine, Ser: 0.83 mg/dL (ref 0.50–1.35)
Glucose, Bld: 96 mg/dL (ref 70–99)

## 2011-06-11 MED ORDER — GATORADE (BH)
240.0000 mL | Freq: Four times a day (QID) | ORAL | Status: DC
  Administered 2011-06-11 – 2011-06-12 (×2): 240 mL via ORAL
  Filled 2011-06-11: qty 480

## 2011-06-11 MED ORDER — HYDROCODONE-ACETAMINOPHEN 5-325 MG PO TABS
1.0000 | ORAL_TABLET | ORAL | Status: DC | PRN
  Administered 2011-06-11 – 2011-06-12 (×2): 1 via ORAL
  Filled 2011-06-11 (×2): qty 1

## 2011-06-11 NOTE — Progress Notes (Signed)
Pt has been visible in milieu, limited interaction or participation, has mentioned that he is having pain in his side from fractured rib, did state the narcotic pain medication he is receiving does ease the pain some, pt did state that auditory hallucinations were not bothering him today, pt has received all medications without incident, will continue to monitor
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
## 2011-06-11 NOTE — Progress Notes (Signed)
[facility] Group Notes:  (Counselor/Nursing/MHT/Case Management/Adjunct)  06/11/2011 1:00 PM  Type of Therapy:  Group Therapy  Participation Level:  Did Not Attend  [name] 06/11/2011, 1:00 PM

## 2011-06-11 NOTE — Progress Notes (Signed)
Patient seen to assess for discharge planning needs. He was also seen on 06/10/11.  Patient continues to endorse off/on SI, depression rated at 4 and anxiety rated at six.  He advised that he is not interested in a residential treatment program at this time but agreeable to follow up with [facility] at discharge.  He plans to return to the home he shares with a friend.  Follow up scheduled as discussed

## 2011-06-11 NOTE — Progress Notes (Signed)
Patient ID: [name], male   DOB: 12/23/1970, 41 y.o.   MRN: [id number]  Shayden was awakened from a sound sleep to see me.  He is cooperative but still not quite awake.  He is complaining about chronic pain from his fractured rib and complaining that the Trazodone makes him sleep soundly so then he doesn't get his pain medication because he is asleep.  His Risperdal was also increased yesterday to 2mg  q HS.  He is also complaining about dizziness upon standing.  He has also been having considerable GI distress over the past two days, complaining of chronic diarrhea without additional symptoms - afebrile, no abdominal cramping. Appetite remains good.   He complains his depression is worse, but then rates it 3-4/10.  He denies any auditory hallucinations within the past 24 hours.  Denies SI, then later says his life is not worth living.   Plan: D/c Trazodone due to am sedation and dizziness, and will continue Risperdal. D/C Zoloft due to diarrhea. Increase frequency of hydrocodone.

## 2011-06-12 ENCOUNTER — Encounter (HOSPITAL_COMMUNITY): Payer: Self-pay | Admitting: *Deleted

## 2011-06-12 ENCOUNTER — Inpatient Hospital Stay (HOSPITAL_COMMUNITY): Payer: PRIVATE HEALTH INSURANCE

## 2011-06-12 LAB — COMPREHENSIVE METABOLIC PANEL
ALT: 16 U/L (ref 0–53)
Alkaline Phosphatase: 46 U/L (ref 39–117)
CO2: 28 mEq/L (ref 19–32)
Chloride: 104 mEq/L (ref 96–112)
GFR calc Af Amer: >90 mL/min (ref >90)
GFR calc non Af Amer: >90 mL/min (ref >90)
Glucose, Bld: 102 mg/dL — ABNORMAL HIGH (ref 70–99)
Potassium: 3.7 mEq/L (ref 3.5–5.1)
Sodium: 140 mEq/L (ref 135–145)
Total Bilirubin: 0.3 mg/dL (ref 0.3–1.2)

## 2011-06-12 LAB — CBC
MCV: 97.5 fL (ref 78.0–100.0)
Platelets: 256 10*3/uL (ref 150–400)
RBC: 3.59 MIL/uL — ABNORMAL LOW (ref 4.22–5.81)
WBC: 7.4 10*3/uL (ref 4.0–10.5)

## 2011-06-12 LAB — DIFFERENTIAL
Eosinophils Relative: 5 % (ref 0–5)
Lymphocytes Relative: 18 % (ref 12–46)
Lymphs Abs: 1.4 10*3/uL (ref 0.7–4.0)
Neutrophils Relative %: 65 % (ref 43–77)

## 2011-06-12 LAB — URINALYSIS, ROUTINE W REFLEX MICROSCOPIC
Bilirubin Urine: NEGATIVE
Hgb urine dipstick: NEGATIVE
Protein, ur: NEGATIVE mg/dL
Urobilinogen, UA: 0.2 mg/dL (ref 0.0–1.0)

## 2011-06-12 MED ORDER — FAMOTIDINE IN NACL 20-0.9 MG/50ML-% IV SOLN
20.0000 mg | Freq: Once | INTRAVENOUS | Status: AC
  Administered 2011-06-12: 20 mg via INTRAVENOUS
  Filled 2011-06-12: qty 50

## 2011-06-12 MED ORDER — SODIUM CHLORIDE 0.9 % IV SOLN
INTRAVENOUS | Status: DC
  Administered 2011-06-12: 20:00:00 via INTRAVENOUS

## 2011-06-12 MED ORDER — ONDANSETRON HCL 4 MG/2ML IJ SOLN
4.0000 mg | INTRAMUSCULAR | Status: DC | PRN
  Administered 2011-06-12: 4 mg via INTRAVENOUS
  Filled 2011-06-12: qty 2

## 2011-06-12 MED ORDER — IOHEXOL 300 MG/ML  SOLN
100.0000 mL | Freq: Once | INTRAMUSCULAR | Status: AC | PRN
  Administered 2011-06-12: 80 mL via INTRAVENOUS

## 2011-06-12 MED ORDER — VENLAFAXINE HCL ER 75 MG PO CP24
75.0000 mg | ORAL_CAPSULE | Freq: Every day | ORAL | Status: DC
  Filled 2011-06-12 (×4): qty 1

## 2011-06-12 MED ORDER — RISPERIDONE 2 MG PO TABS
3.0000 mg | ORAL_TABLET | Freq: Every day | ORAL | Status: DC
  Administered 2011-06-13: 3 mg via ORAL
  Filled 2011-06-12: qty 1
  Filled 2011-06-12: qty 3
  Filled 2011-06-12 (×2): qty 1

## 2011-06-12 MED ORDER — FAMOTIDINE 40 MG/5ML PO SUSR: 0.5000 mg/kg | Freq: Once | ORAL | Status: DC

## 2011-06-12 MED ORDER — GABAPENTIN 300 MG PO CAPS
600.0000 mg | ORAL_CAPSULE | ORAL | Status: DC
  Administered 2011-06-12: 600 mg via ORAL
  Filled 2011-06-12 (×6): qty 2

## 2011-06-12 NOTE — ED Provider Notes (Signed)
History     CSN: [id number]  Arrival date & time 06/12/11  1733   First MD Initiated Contact with Patient 06/12/11 1954      Chief Complaint  Patient presents with  . Diarrhea    from [facility]    HPI Pt was seen at 2020.  Per pt, c/o gradual onset and persistence of multiple intermittent episodes of diarrhea that began 3 days ago.  Describes the diarrhea as "watery."  Has been assoc with several episodes of N/V.  Denies black or blood in stools or emesis, no abd pain, no back pain, no CP/SOB, no fevers.    Past Medical History  Diagnosis Date  . Alcohol abuse   . Anemia   . Severe major depression with psychotic features     History reviewed. No pertinent past surgical history.   History  Substance Use Topics  . Smoking status: Current Everyday Smoker -- 0.5 packs/day  . Smokeless tobacco: Not on file  . Alcohol Use: 50.4 oz/week    84 Cans of beer per week     heavy     Review of Systems ROS: Statement: All systems negative except as marked or noted in the HPI; Constitutional: Negative for fever and chills. ; ; Eyes: Negative for eye pain, redness and discharge. ; ; ENMT: Negative for ear pain, hoarseness, nasal congestion, sinus pressure and sore throat. ; ; Cardiovascular: Negative for chest pain, palpitations, diaphoresis, dyspnea and peripheral edema. ; ; Respiratory: Negative for cough, wheezing and stridor. ; ; Gastrointestinal: +N/V/D. Negative for abdominal pain, blood in stool, hematemesis, jaundice and rectal bleeding. . ; ; Genitourinary: Negative for dysuria, flank pain and hematuria. ; ; Musculoskeletal: Negative for back pain and neck pain. Negative for swelling and trauma.; ; Skin: Negative for pruritus, rash, abrasions, blisters, bruising and skin lesion.; ; Neuro: Negative for headache, lightheadedness and neck stiffness. Negative for weakness, altered level of consciousness , altered mental status, extremity weakness, paresthesias, involuntary movement, seizure and  syncope.     Allergies  Review of patient's allergies indicates no known allergies.  Home Medications  No current outpatient prescriptions on file.  BP 130/79  Pulse 75  Temp(Src) 98.4 F (36.9 C) (Oral)  Resp 15  Ht 5\' 8"  (1.727 m)  Wt 143 lb (64.864 kg)  BMI 21.74 kg/m2  SpO2 96%  Physical Exam 2025: Physical examination:  Nursing notes reviewed; Vital signs and O2 SAT reviewed;  Constitutional: Well developed, Well nourished, Well hydrated, In no acute distress; Head:  Normocephalic, atraumatic; Eyes: EOMI, PERRL, No scleral icterus; ENMT: Mouth and pharynx normal, Mucous membranes moist; Neck: Supple, Full range of motion, No lymphadenopathy; Cardiovascular: Regular rate and rhythm, No murmur, rub, or gallop; Respiratory: Breath sounds clear & equal bilaterally, No rales, rhonchi, wheezes, or rub, Normal respiratory effort/excursion; Chest: Nontender, Movement normal; Abdomen: Soft, Nontender, +mildly distended, Normal bowel sounds; Extremities: Pulses normal, No tenderness, No edema, No calf edema or asymmetry.; Neuro: AA&Ox3, Major CN grossly intact. Speech clear, no facial droop, gait upright and steady. No gross focal motor or sensory deficits in extremities.; Skin: Color normal, Warm, Dry, no rash; Psych:  Affect flat.     ED Course  Procedures     MDM  MDM Reviewed: nursing note, vitals and previous chart Interpretation: labs and CT scan   Results for orders placed during the hospital encounter of 06/06/11  BASIC METABOLIC PANEL      Component Value Range   Sodium 140  135 - 145 (  mEq/L)   Potassium 3.9  3.5 - 5.1 (mEq/L)   Chloride 106  96 - 112 (mEq/L)   CO2 26  19 - 32 (mEq/L)   Glucose, Bld 96  70 - 99 (mg/dL)   BUN 9  6 - 23 (mg/dL)   Creatinine, Ser 1.61  0.50 - 1.35 (mg/dL)   Calcium 9.6  8.4 - 09.6 (mg/dL)   GFR calc non Af Amer >90  >90 (mL/min)   GFR calc Af Amer >90  >90 (mL/min)  CBC      Component Value Range   WBC 7.4  4.0 - 10.5 (K/uL)   RBC  3.59 (*) 4.22 - 5.81 (MIL/uL)   Hemoglobin 11.4 (*) 13.0 - 17.0 (g/dL)   HCT 04.5 (*) 40.9 - 52.0 (%)   MCV 97.5  78.0 - 100.0 (fL)   MCH 31.8  26.0 - 34.0 (pg)   MCHC 32.6  30.0 - 36.0 (g/dL)   RDW 81.1  91.4 - 78.2 (%)   Platelets 256  150 - 400 (K/uL)  DIFFERENTIAL      Component Value Range   Neutrophils Relative 65  43 - 77 (%)   Neutro Abs 4.8  1.7 - 7.7 (K/uL)   Lymphocytes Relative 18  12 - 46 (%)   Lymphs Abs 1.4  0.7 - 4.0 (K/uL)   Monocytes Relative 11  3 - 12 (%)   Monocytes Absolute 0.8  0.1 - 1.0 (K/uL)   Eosinophils Relative 5  0 - 5 (%)   Eosinophils Absolute 0.4  0.0 - 0.7 (K/uL)   Basophils Relative 0  0 - 1 (%)   Basophils Absolute 0.0  0.0 - 0.1 (K/uL)  COMPREHENSIVE METABOLIC PANEL      Component Value Range   Sodium 140  135 - 145 (mEq/L)   Potassium 3.7  3.5 - 5.1 (mEq/L)   Chloride 104  96 - 112 (mEq/L)   CO2 28  19 - 32 (mEq/L)   Glucose, Bld 102 (*) 70 - 99 (mg/dL)   BUN 12  6 - 23 (mg/dL)   Creatinine, Ser 9.56  0.50 - 1.35 (mg/dL)   Calcium 9.6  8.4 - 21.3 (mg/dL)   Total Protein 7.5  6.0 - 8.3 (g/dL)   Albumin 3.7  3.5 - 5.2 (g/dL)   AST 21  0 - 37 (U/L)   ALT 16  0 - 53 (U/L)   Alkaline Phosphatase 46  39 - 117 (U/L)   Total Bilirubin 0.3  0.3 - 1.2 (mg/dL)   GFR calc non Af Amer >90  >90 (mL/min)   GFR calc Af Amer >90  >90 (mL/min)  LIPASE, BLOOD      Component Value Range   Lipase 28  11 - 59 (U/L)  URINALYSIS, ROUTINE W REFLEX MICROSCOPIC      Component Value Range   Color, Urine YELLOW  YELLOW    APPearance CLEAR  CLEAR    Specific Gravity, Urine 1.037 (*) 1.005 - 1.030    pH 5.5  5.0 - 8.0    Glucose, UA NEGATIVE  NEGATIVE (mg/dL)   Hgb urine dipstick NEGATIVE  NEGATIVE    Bilirubin Urine NEGATIVE  NEGATIVE    Ketones, ur TRACE (*) NEGATIVE (mg/dL)   Protein, ur NEGATIVE  NEGATIVE (mg/dL)   Urobilinogen, UA 0.2  0.0 - 1.0 (mg/dL)   Nitrite NEGATIVE  NEGATIVE    Leukocytes, UA NEGATIVE  NEGATIVE     Ct Abdomen Pelvis W  Contrast 06/12/2011  *RADIOLOGY  REPORT*  Clinical Data: Diarrhea.  Abdominal pain.  Symptoms for 3 days.  CT ABDOMEN AND PELVIS WITH CONTRAST  Technique:  Multidetector CT imaging of the abdomen and pelvis was performed following the standard protocol during bolus administration of intravenous contrast.  Contrast: 80mL OMNIPAQUE IOHEXOL 300 MG/ML IV SOLN  Comparison: 02/14/2006  Findings: There are small locules of free intraperitoneal air, primarily within the upper abdomen.  These are seen around the diaphragm, around the upper portion of the liver, anterior to the stomach, adjacent to the duodenal bulb.  A few locules are identified in the retroperitoneal region but the majority are intraperitoneal.  The stomach wall is unremarkable in appearance.  Mildly prominent duodenal bulb on early images, normal appearance on delayed images.  The terminal ileum appears thick-walled without surrounding stranding.  The length of the involved segment appears to be approximately 5 cm.  No focal abnormality identified within the liver, spleen, adrenal glands, or kidneys.  The gallbladder is contracted.  The appendix is well seen and has a normal appearance.  Colonic loops are normal in caliber and wall thickness.  There are scattered colonic diverticula but no CT evidence for acute diverticulitis.  No retroperitoneal or mesenteric adenopathy. No evidence for aortic aneurysm. Visualized osseous structures have a normal appearance.  IMPRESSION:  1.  Small amount of intraperitoneal air primarily seen within the upper abdomen.  The findings are consistent with perforated viscus. The differential diagnosis would include a perforated stomach, duodenum, or colon.  However, no other associated CT findings are identified to identify the source of the air. 2.  Thickened terminal ileum, consistent with ileitis.  Consider evaluation for Crohn disease. 3.  No evidence for abscess, bowel obstruction. 4.  No evidence for acute appendicitis.   Critical test results telephoned to Dr. Clarene Duke at the time of interpretation on date 06/12/2011 at time 11:43 p.m.  Original Report Authenticated By: Patterson Hammersmith, M.D.     12:11 AM:  Pt has been ambulatory around the ED with upright steady gait, easy resps.  CT without clear source for viscus perforation as above, but VS remain stable, WBC normal.  Will need admit for further obs.  T/C to Surgeon Dr. Gerrit Friends, case discussed, including:  HPI, pertinent PM/SHx, VS/PE, dx testing, ED course and treatment.  Agreeable to admit.  He will come to ED for eval.         [facility]   Laray Anger, DO 06/13/11 [phone]

## 2011-06-12 NOTE — ED Notes (Signed)
Pt back from CT

## 2011-06-12 NOTE — Progress Notes (Signed)
[facility] Group Notes:  (Counselor/Nursing/MHT/Case Management/Adjunct)  06/12/2011 12:51 PM  Type of Therapy:  Group Therapy  Participation Level:  None  Participation Quality:  Patient spent most of the session sleeping. He had to be woken up because he was laying his head on a male peer. He was given the chance to return to his room because of sleeping but he remained. When the male peer that he had been sleeping on left to speak to doctor, another peer went to sit beside him. He was talking to her but will not participate in group discussion.   HartisAram Beecham 06/12/2011, 12:51 PM

## 2011-06-12 NOTE — Discharge Planning (Signed)
Met with patient in Aftercare Planning Group.   He slept for the most part, and had to be awakened several times.  He stated he felt sedated from the morning medications.  He again said he is not interested in rehab.  He wants to get back to work, Dentist.  He feels that going to groups helps him, but again, slept through groups today.  Displayed delayed speech.  Talked of having pain in his side.  No case management needs today, other than utilization review.  During Aftercare Planning Group, Case Manager provided psychoeducation on "Suicide Prevention Information."  This included descriptions of risk factors for suicide, warning signs that an individual is in crisis and thinking of suicide, and what to do if this occurs.  Pt was unable to indicate understanding of information provided, as he slept throughout this.  He will be given brochure upon discharge.     Ambrose Mantle, LCSW 06/12/2011, 2:01 PM

## 2011-06-12 NOTE — Progress Notes (Signed)
Pt. Left at approximately 1705 going to [facility] ED for evaluation with MHT escort.  Pt. Has not returned to unit at present.

## 2011-06-12 NOTE — ED Notes (Signed)
Patient transported to CT 

## 2011-06-12 NOTE — ED Notes (Signed)
Pt sent over from [facility] due to diarrhea x3 days, want patient r/o for reaction to zoloft vs. GI virus

## 2011-06-12 NOTE — Progress Notes (Signed)
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  Alcohol Dependence.   The patient was seen today and reports the following:   ADL's: Intact.  Sleep: The patient reports to having continued difficulty initiating and maintaining sleep.  Appetite: The patient report a decreased appetite.   Mild>(1-10) >Severe  Hopelessness (1-10): 6-7  Depression (1-10): 6  Anxiety (1-10): 4   Suicidal Ideation: The patient denies any suicidal ideations today. Plan: No  Intent: No  Means: No   Homicidal Ideation: The patient adamantly denies any homicidal ideations today.  Plan: No  Intent: No.  Means: No   General Appearance /Behavior: Casual and cooperative.  Eye Contact: Good.  Speech: Appropriate in rate and volume with no pressuring noted.  Motor Behavior: Appropriate.  Level of Consciousness: Alert and Oriented x 3.  Mental Status: Alert and Oriented x 3.  Mood: Moderately Depressed.  Affect: Essentially Flat.  Anxiety Level: Mild to Moderately anxious.  Thought Process: wnl  Thought Content: The patient reports one episode of auditory hallucinations last night. He denies any visual hallucinations or delusional thinking.  Perception:. wnl.  Judgment: Fair.  Insight: Fair.  Cognition: Oriented to time, place and person.  Sleep:  Number of Hours: 5.25    Vital Signs:Blood pressure 120/80, pulse 93, temperature 98.9 F (37.2 C), temperature source Oral, resp. rate 100, height 5\' 8"  (1.727 m), weight 64.864 kg (143 lb).  Current Medications: Current Facility-Administered Medications  Medication Dose Route Frequency Provider Last Rate Last Dose  . alum & mag hydroxide-simeth (MAALOX/MYLANTA) 200-200-20 MG/5ML suspension 30 mL  30 mL Oral Q4H PRN Mickeal Skinner, MD      . celecoxib (CELEBREX) capsule 100 mg  100 mg Oral BH-qamhs Amish Mintzer, MD   100 mg at 06/12/11 0754  . chlordiazePOXIDE  (LIBRIUM) capsule 25 mg  25 mg Oral BH-qamhs Tyshia Fenter, MD   25 mg at 06/11/11 2209   Followed by  . chlordiazePOXIDE (LIBRIUM) capsule 25 mg  25 mg Oral Daily Franchot Gallo, MD   25 mg at 06/12/11 0755  . gabapentin (NEURONTIN) tablet 600 mg  600 mg Oral BH-[id number] Kylen Schliep, MD   600 mg at 06/12/11 1354  . gatorade (BH)  240 mL Oral QID Viviann Spare, NP   240 mL at 06/12/11 9811  . HYDROcodone-acetaminophen (NORCO) 5-325 MG per tablet 1 tablet  1 tablet Oral Q4H PRN Viviann Spare, NP   1 tablet at 06/12/11 1356  . magnesium hydroxide (MILK OF MAGNESIA) suspension 30 mL  30 mL Oral Daily PRN Mickeal Skinner, MD      . mulitivitamin with minerals tablet 1 tablet  1 tablet Oral Daily Franchot Gallo, MD   1 tablet at 06/12/11 0754  . risperiDONE (RISPERDAL) tablet 3 mg  3 mg Oral QHS Dorien Bessent, MD      . thiamine (VITAMIN B-1) tablet 100 mg  100 mg Oral Daily Franchot Gallo, MD   100 mg at 06/12/11 0756  . venlafaxine (EFFEXOR-XR) 24 hr capsule 75 mg  75 mg Oral Q breakfast Franchot Gallo, MD      . DISCONTD: HYDROcodone-acetaminophen (NORCO) 5-325 MG per tablet 1 tablet  1 tablet Oral Q6H PRN Viviann Spare, NP   1 tablet at 06/11/11 [phone]  . DISCONTD: risperiDONE (RISPERDAL) tablet 2 mg  2 mg Oral QHS Franchot Gallo, MD   2 mg at 06/11/11 2210  .  DISCONTD: sertraline (ZOLOFT) tablet 100 mg  100 mg Oral Daily Franchot Gallo, MD   100 mg at 06/11/11 0820   Lab Results:  Results for orders placed during the hospital encounter of 06/06/11 (from the past 48 hour(s))  BASIC METABOLIC PANEL     Status: Normal   Collection Time   06/11/11  7:57 PM      Component Value Range Comment   Sodium 140  135 - 145 (mEq/L)    Potassium 3.9  3.5 - 5.1 (mEq/L)    Chloride 106  96 - 112 (mEq/L)    CO2 26  19 - 32 (mEq/L)    Glucose, Bld 96  70 - 99 (mg/dL)    BUN 9  6 - 23 (mg/dL)    Creatinine, Ser 1.61  0.50 - 1.35 (mg/dL)    Calcium 9.6  8.4 - 10.5 (mg/dL)    GFR calc non Af Amer >90   >90 (mL/min)    GFR calc Af Amer >90  >90 (mL/min)    Time was spent today discussing with the patient his recent diarrhea and vomiting.  The patient reports that this began 2 days ago with this now worsening over the last 24 hours.  Since the patient has a broken rib and is having difficulty taking deep breaths, it is possible he is developing a pneumonia.  The patient agrees with the plan to go to the ED for a more complete evaluation.  IMPRESSION:  Left ninth rib fracture, minimally-displaced, without pneumothorax  or other complicating feature.  Original Report Authenticated By: Osa Craver, M.D.  Findings: Scan extends from T11-12 through S4. Tip of the conus is  at L1 and appears normal. Paraspinal soft tissues are normal.  T11-12 through L3-4: Normal.  L4-5: There is a focal soft disc protrusion into the right neural  foramen without impingement on the exiting right L4 nerve. There  is no impingement upon the thecal sac or lateral recess.  L5 S1: There is a tiny bulge of the disc central and slightly to  the right with no impingement upon the thecal sac or nerves.  There is no spinal or foraminal stenosis. No facet arthritis.   IMPRESSION:  Small soft disc protrusion into the right neural foramen at L4-5  with no neural impingement.  Minimal degenerative disc disease at L5-S1.  Otherwise, normal exam.  Original Report Authenticated By: Gwynn Burly, M.D.   Treatment Plan Summary:  1. Daily contact with patient to assess and evaluate symptoms and progress in treatment  2. Medication management  3. The patient will deny suicidal ideations or homicidal ideations for 48 hours prior to discharge and have a depression and anxiety rating of 3 or less. The patient will also deny any auditory or visual hallucinations or delusional thinking.  4. The patient will deny any symptoms of substance withdrawal at time of discharge.   Plan:  1. Will start Effexor XR 75 mgs po q am  for depression and to possibly help address his pain. 2. Will increase Risperdal to 3 mg po qhs to further address his residual psychosis.  3. Will continue Librium Detox for alcohol withdrawal.  4. Will continue to monitor. 5. Will send the patient to the ED for evaluation of his nausea and diarrhea.  Peytyn Trine 06/12/2011, 2:51 PM

## 2011-06-12 NOTE — Progress Notes (Signed)
Lying quietly in bed with eyes closed.  Exhibiting normal sleep behavior.  Q 15 minute safety checks are being conducted to maintain  Safety.

## 2011-06-12 NOTE — Progress Notes (Signed)
[facility] Notes:  (Counselor/Nursing/MHT/Case Management/Adjunct)  06/12/2011 12:50 PM  Type of Therapy:  Music Therapy  Participation Level:  Minimal  Participation Quality:  Affect:  Patient slept through a lot of session. He has not been engaged in therapy. Spends a lot of focus on male peer with side conversation.    Seletha Zimmermann, Aram Beecham 06/12/2011, 12:50 PM

## 2011-06-12 NOTE — ED Notes (Signed)
Pt still in CT. Sitter still with patient. Pt calm. No issues

## 2011-06-12 NOTE — Progress Notes (Signed)
Pt c/o aching in side and diarrhea. Gave prn medication for pain and reported to MD. Pt is scheduled to go to [facility] this afternoon for evaluation. Safety maintained on unit.

## 2011-06-13 ENCOUNTER — Inpatient Hospital Stay (HOSPITAL_COMMUNITY)
Admission: AD | Admit: 2011-06-13 | Discharge: 2011-06-16 | DRG: 394 | Disposition: A | Discharge status: Home / Self Care | Payer: Self-pay | Source: Ambulatory Visit | Attending: Surgery | Admitting: Surgery

## 2011-06-13 ENCOUNTER — Encounter (HOSPITAL_COMMUNITY): Payer: Self-pay | Admitting: Emergency Medicine

## 2011-06-13 DIAGNOSIS — F209 Schizophrenia, unspecified: Secondary | ICD-10-CM | POA: Diagnosis present

## 2011-06-13 DIAGNOSIS — R197 Diarrhea, unspecified: Secondary | ICD-10-CM | POA: Diagnosis present

## 2011-06-13 DIAGNOSIS — R45851 Suicidal ideations: Secondary | ICD-10-CM

## 2011-06-13 DIAGNOSIS — D649 Anemia, unspecified: Secondary | ICD-10-CM | POA: Diagnosis present

## 2011-06-13 DIAGNOSIS — Z4789 Encounter for other orthopedic aftercare: Secondary | ICD-10-CM

## 2011-06-13 DIAGNOSIS — K668 Other specified disorders of peritoneum: Secondary | ICD-10-CM

## 2011-06-13 DIAGNOSIS — F323 Major depressive disorder, single episode, severe with psychotic features: Secondary | ICD-10-CM | POA: Diagnosis present

## 2011-06-13 LAB — CBC
Hemoglobin: 12.2 g/dL — ABNORMAL LOW (ref 13.0–17.0)
MCH: 32 pg (ref 26.0–34.0)
MCHC: 32.9 g/dL (ref 30.0–36.0)
RDW: 13.5 % (ref 11.5–15.5)

## 2011-06-13 MED ORDER — INFLUENZA VIRUS VACC SPLIT PF IM SUSP: 0.5000 mL | INTRAMUSCULAR | Status: DC

## 2011-06-13 MED ORDER — SODIUM CHLORIDE 0.9 % IV SOLN
3.0000 g | Freq: Four times a day (QID) | INTRAVENOUS | Status: DC
  Administered 2011-06-13 – 2011-06-14 (×4): 3 g via INTRAVENOUS
  Filled 2011-06-13 (×7): qty 3

## 2011-06-13 MED ORDER — PANTOPRAZOLE SODIUM 40 MG IV SOLR
40.0000 mg | Freq: Two times a day (BID) | INTRAVENOUS | Status: DC
  Administered 2011-06-13 (×2): 40 mg via INTRAVENOUS
  Filled 2011-06-13 (×2): qty 40

## 2011-06-13 MED ORDER — KCL IN DEXTROSE-NACL 20-5-0.45 MEQ/L-%-% IV SOLN
INTRAVENOUS | Status: DC
  Administered 2011-06-13 – 2011-06-16 (×5): via INTRAVENOUS
  Filled 2011-06-13 (×12): qty 1000

## 2011-06-13 MED ORDER — PANTOPRAZOLE SODIUM 40 MG IV SOLR
40.0000 mg | Freq: Two times a day (BID) | INTRAVENOUS | Status: DC
  Administered 2011-06-13 – 2011-06-14 (×3): 40 mg via INTRAVENOUS
  Filled 2011-06-13 (×5): qty 40

## 2011-06-13 MED ORDER — INFLUENZA VIRUS VACC SPLIT PF IM SUSP
0.5000 mL | INTRAMUSCULAR | Status: AC
  Filled 2011-06-13: qty 0.5

## 2011-06-13 MED ORDER — SODIUM CHLORIDE 0.9 % IV SOLN
3.0000 g | Freq: Four times a day (QID) | INTRAVENOUS | Status: DC
  Administered 2011-06-13 (×2): 3 g via INTRAVENOUS
  Filled 2011-06-13 (×2): qty 3

## 2011-06-13 MED ORDER — KCL IN DEXTROSE-NACL 20-5-0.45 MEQ/L-%-% IV SOLN
INTRAVENOUS | Status: DC
  Administered 2011-06-13: 06:00:00 via INTRAVENOUS
  Filled 2011-06-13: qty 1000

## 2011-06-13 NOTE — ED Notes (Signed)
MD at bedside.  Dr. Gerrit Friends at bedside

## 2011-06-13 NOTE — H&P (Signed)
Macklen Wilhoite is an 41 y.o. male.   Chief Complaint: Diarrhea  HPI: Patient is a 41 yo BM admitted at [facility] for alcohol addiction and suicidal ideation.  Patient complains of 3 day hx of diarrhea.  No abdominal pain.  No nausea or vomiting.  No prior abd surgery.  Patient seen in ER.  Normal WBC and diff.  Normal lytes.  CT abd/pelvis shows a few small loculations of free air near liver, stomach, and small bowel.  Some thickening in terminal ileum.  No hx of IBD.  General surgery asked to evaluate and manage.  Past Medical History  Diagnosis Date  . Alcohol abuse   . Anemia   . Severe major depression with psychotic features     History reviewed. No pertinent past surgical history.  History reviewed. No pertinent family history. Social History:  reports that he has been smoking.  He does not have any smokeless tobacco history on file. He reports that he drinks about 50.4 ounces of alcohol per week. He reports that he uses illicit drugs (Marijuana).  Allergies: No Known Allergies  Medications Prior to Admission  Medication Dose Route Frequency Provider Last Rate Last Dose  . 0.9 %  sodium chloride infusion   Intravenous Continuous Laray Anger, DO 75 mL/hr at 06/12/11 2023    . alum & mag hydroxide-simeth (MAALOX/MYLANTA) 200-200-20 MG/5ML suspension 30 mL  30 mL Oral Q4H PRN Mickeal Skinner, MD      . celecoxib (CELEBREX) capsule 100 mg  100 mg Oral BH-qamhs Randy Readling, MD   100 mg at 06/12/11 0754  . chlordiazePOXIDE (LIBRIUM) capsule 25 mg  25 mg Oral Q6H PRN Franchot Gallo, MD      . chlordiazePOXIDE (LIBRIUM) capsule 25 mg  25 mg Oral Once Franchot Gallo, MD   25 mg at 06/08/11 1544  . chlordiazePOXIDE (LIBRIUM) capsule 25 mg  25 mg Oral QID Franchot Gallo, MD   25 mg at 06/09/11 2149   Followed by  . chlordiazePOXIDE (LIBRIUM) capsule 25 mg  25 mg Oral TID Franchot Gallo, MD   25 mg at 06/10/11 1647   Followed by  . chlordiazePOXIDE (LIBRIUM) capsule 25 mg   25 mg Oral BH-qamhs Randy Readling, MD   25 mg at 06/11/11 2209   Followed by  . chlordiazePOXIDE (LIBRIUM) capsule 25 mg  25 mg Oral Daily Franchot Gallo, MD   25 mg at 06/12/11 0755  . famotidine (PEPCID) IVPB 20 mg  20 mg Intravenous Once Laray Anger, DO   20 mg at 06/12/11 2109  . gabapentin (NEURONTIN) capsule 600 mg  600 mg Oral [id number] Laray Anger, DO   600 mg at 06/12/11 2109  . gatorade (BH)  240 mL Oral QID Viviann Spare, NP   240 mL at 06/12/11 1610  . HYDROcodone-acetaminophen (NORCO) 5-325 MG per tablet 1 tablet  1 tablet Oral Q4H PRN Viviann Spare, NP   1 tablet at 06/12/11 1356  . hydrOXYzine (ATARAX/VISTARIL) tablet 25 mg  25 mg Oral Q6H PRN Franchot Gallo, MD      . iohexol (OMNIPAQUE) 300 MG/ML solution 100 mL  100 mL Intravenous Once PRN Medication Radiologist, MD   80 mL at 06/12/11 2254  . loperamide (IMODIUM) capsule 2-4 mg  2-4 mg Oral PRN Franchot Gallo, MD   2 mg at 06/10/11 2224  . magnesium hydroxide (MILK OF MAGNESIA) suspension 30 mL  30 mL Oral Daily PRN Mickeal Skinner, MD      .  mulitivitamin with minerals tablet 1 tablet  1 tablet Oral Daily Franchot Gallo, MD   1 tablet at 06/12/11 0754  . ondansetron (ZOFRAN) injection 4 mg  4 mg Intravenous Q1H PRN Laray Anger, DO   4 mg at 06/12/11 2109  . ondansetron (ZOFRAN-ODT) disintegrating tablet 4 mg  4 mg Oral Q6H PRN Franchot Gallo, MD      . risperiDONE (RISPERDAL) tablet 3 mg  3 mg Oral QHS Randy Readling, MD      . sodium chloride 0.9 % bolus 1,000 mL  1,000 mL Intravenous Once [employer], PA-C   1,000 mL at 06/06/11 4098  . sodium chloride 0.9 % bolus 1,000 mL  1,000 mL Intravenous Once Gavin Pound. Ghim, MD   1,000 mL at 06/06/11 1041  . thiamine (B-1) injection 100 mg  100 mg Intramuscular Once Franchot Gallo, MD   100 mg at 06/08/11 1545  . thiamine (VITAMIN B-1) tablet 100 mg  100 mg Oral Daily Franchot Gallo, MD   100 mg at 06/12/11 0756  . venlafaxine (EFFEXOR-XR) 24 hr  capsule 75 mg  75 mg Oral Q breakfast Franchot Gallo, MD      . DISCONTD: acetaminophen (TYLENOL) tablet 650 mg  650 mg Oral Q4H PRN Shaaron Adler, PA-C      . DISCONTD: acetaminophen (TYLENOL) tablet 650 mg  650 mg Oral Q6H PRN Mickeal Skinner, MD      . DISCONTD: alum & mag hydroxide-simeth (MAALOX/MYLANTA) 200-200-20 MG/5ML suspension 30 mL  30 mL Oral PRN Shaaron Adler, PA-C      . DISCONTD: chlordiazePOXIDE (LIBRIUM) capsule 25 mg  25 mg Oral Q6H PRN Mickeal Skinner, MD      . DISCONTD: chlordiazePOXIDE (LIBRIUM) capsule 25 mg  25 mg Oral QID Mickeal Skinner, MD   25 mg at 06/07/11 1246  . DISCONTD: chlordiazePOXIDE (LIBRIUM) capsule 25 mg  25 mg Oral TID Mickeal Skinner, MD      . DISCONTD: chlordiazePOXIDE (LIBRIUM) capsule 25 mg  25 mg Oral BH-qamhs Mickeal Skinner, MD      . DISCONTD: chlordiazePOXIDE (LIBRIUM) capsule 25 mg  25 mg Oral Daily Mickeal Skinner, MD      . DISCONTD: famotidine (PEPCID) 40 MG/5ML suspension 32.8 mg  0.5 mg/kg Oral Once Laray Anger, DO      . DISCONTD: folic acid (FOLVITE) tablet 1 mg  1 mg Oral Daily Shaaron Adler, PA-C   1 mg at 06/06/11 1312  . DISCONTD: gabapentin (NEURONTIN) capsule 300 mg  300 mg Oral TID AC & HS Franchot Gallo, MD   300 mg at 06/08/11 1255  . DISCONTD: gabapentin (NEURONTIN) tablet 300 mg  300 mg Oral TID AC & HS Mickie D. Adams, PA   300 mg at 06/08/11 1191  . DISCONTD: gabapentin (NEURONTIN) tablet 600 mg  600 mg Oral BH-[id number] Randy Readling, MD   600 mg at 06/12/11 1354  . DISCONTD: HYDROcodone-acetaminophen (LORTAB) 7.5-500 MG/15ML solution 5 mL  5 mL Oral Q4H PRN Viviann Spare, NP      . DISCONTD: HYDROcodone-acetaminophen (NORCO) 5-325 MG per tablet 1 tablet  1 tablet Oral Q6H PRN Viviann Spare, NP   1 tablet at 06/11/11 [phone]  . DISCONTD: hydrOXYzine (ATARAX/VISTARIL) tablet 25 mg  25 mg Oral Q6H PRN Mickeal Skinner, MD      . DISCONTD: hydrOXYzine (ATARAX/VISTARIL) tablet 25 mg  25 mg Oral Q6H  PRN Franchot Gallo, MD      . DISCONTD: ibuprofen (ADVIL,MOTRIN)  tablet 600 mg  600 mg Oral Q8H PRN Shaaron Adler, PA-C      . DISCONTD: ibuprofen (ADVIL,MOTRIN) tablet 800 mg  800 mg Oral BH-[id number] Randy Readling, MD   800 mg at 06/10/11 1343  . DISCONTD: loperamide (IMODIUM) capsule 2-4 mg  2-4 mg Oral PRN Mickeal Skinner, MD      . DISCONTD: LORazepam (ATIVAN) injection 1 mg  1 mg Intravenous Q6H PRN Shaaron Adler, PA-C      . DISCONTD: LORazepam (ATIVAN) tablet 0-4 mg  0-4 mg Oral Q6H Stephanie Justice Zoar, PA-C      . DISCONTD: LORazepam (ATIVAN) tablet 0-4 mg  0-4 mg Oral Q12H Shaaron Adler, PA-C      . DISCONTD: LORazepam (ATIVAN) tablet 1 mg  1 mg Oral Q6H PRN Shaaron Adler, PA-C      . DISCONTD: mirtazapine (REMERON SOL-TAB) disintegrating tablet 15 mg  15 mg Oral QHS Mickie D. Adams, PA   15 mg at 06/07/11 2146  . DISCONTD: mulitivitamin with minerals tablet 1 tablet  1 tablet Oral Daily Shaaron Adler, PA-C   1 tablet at 06/06/11 1313  . DISCONTD: mulitivitamin with minerals tablet 1 tablet  1 tablet Oral Daily Franchot Gallo, MD      . DISCONTD: ondansetron (ZOFRAN) tablet 4 mg  4 mg Oral Q8H PRN Shaaron Adler, PA-C      . DISCONTD: ondansetron (ZOFRAN-ODT) disintegrating tablet 4 mg  4 mg Oral Q6H PRN Mickeal Skinner, MD      . DISCONTD: risperiDONE (RISPERDAL) tablet 1 mg  1 mg Oral QHS Viviann Spare, NP   1 mg at 06/09/11 2150  . DISCONTD: risperiDONE (RISPERDAL) tablet 1 mg  1 mg Oral Q6H PRN Viviann Spare, NP      . DISCONTD: risperiDONE (RISPERDAL) tablet 2 mg  2 mg Oral QHS Franchot Gallo, MD   2 mg at 06/11/11 2210  . DISCONTD: sertraline (ZOLOFT) tablet 100 mg  100 mg Oral Daily Franchot Gallo, MD   100 mg at 06/11/11 0820  . DISCONTD: sertraline (ZOLOFT) tablet 50 mg  50 mg Oral Daily Franchot Gallo, MD   50 mg at 06/10/11 1610  . DISCONTD: thiamine (B-1) injection 100 mg  100 mg Intravenous Daily Shaaron Adler, PA-C      . DISCONTD: thiamine (B-1) injection 100 mg  100 mg Intramuscular Once Mickeal Skinner, MD      . DISCONTD: thiamine (VITAMIN B-1) tablet 100 mg  100 mg Oral Daily Shaaron Adler, PA-C   100 mg at 06/06/11 1312  . DISCONTD: thiamine (VITAMIN B-1) tablet 100 mg  100 mg Oral Daily Mickeal Skinner, MD   100 mg at 06/08/11 0816  . DISCONTD: traMADol (ULTRAM) tablet 50 mg  50 mg Oral Q6H PRN Mickeal Skinner, MD   50 mg at 06/09/11 0805  . DISCONTD: traZODone (DESYREL) tablet 100 mg  100 mg Oral QHS Franchot Gallo, MD   100 mg at 06/10/11 2224  . DISCONTD: traZODone (DESYREL) tablet 50 mg  50 mg Oral QHS Mickeal Skinner, MD   50 mg at 06/07/11 2146  . DISCONTD: zolpidem (AMBIEN) tablet 5 mg  5 mg Oral QHS PRN Shaaron Adler, PA-C       No current outpatient prescriptions on file as of 06/06/2011.    Results for orders placed during the hospital encounter of 06/06/11 (from the past 48 hour(s))  BASIC METABOLIC PANEL     Status: Normal  Collection Time   06/11/11  7:57 PM      Component Value Range Comment   Sodium 140  135 - 145 (mEq/L)    Potassium 3.9  3.5 - 5.1 (mEq/L)    Chloride 106  96 - 112 (mEq/L)    CO2 26  19 - 32 (mEq/L)    Glucose, Bld 96  70 - 99 (mg/dL)    BUN 9  6 - 23 (mg/dL)    Creatinine, Ser 1.61  0.50 - 1.35 (mg/dL)    Calcium 9.6  8.4 - 10.5 (mg/dL)    GFR calc non Af Amer >90  >90 (mL/min)    GFR calc Af Amer >90  >90 (mL/min)   CBC     Status: Abnormal   Collection Time   06/12/11  7:56 PM      Component Value Range Comment   WBC 7.4  4.0 - 10.5 (K/uL)    RBC 3.59 (*) 4.22 - 5.81 (MIL/uL)    Hemoglobin 11.4 (*) 13.0 - 17.0 (g/dL)    HCT 09.6 (*) 04.5 - 52.0 (%)    MCV 97.5  78.0 - 100.0 (fL)    MCH 31.8  26.0 - 34.0 (pg)    MCHC 32.6  30.0 - 36.0 (g/dL)    RDW 40.9  81.1 - 91.4 (%)    Platelets 256  150 - 400 (K/uL)   DIFFERENTIAL     Status: Normal   Collection Time   06/12/11  7:56 PM      Component Value Range Comment    Neutrophils Relative 65  43 - 77 (%)    Neutro Abs 4.8  1.7 - 7.7 (K/uL)    Lymphocytes Relative 18  12 - 46 (%)    Lymphs Abs 1.4  0.7 - 4.0 (K/uL)    Monocytes Relative 11  3 - 12 (%)    Monocytes Absolute 0.8  0.1 - 1.0 (K/uL)    Eosinophils Relative 5  0 - 5 (%)    Eosinophils Absolute 0.4  0.0 - 0.7 (K/uL)    Basophils Relative 0  0 - 1 (%)    Basophils Absolute 0.0  0.0 - 0.1 (K/uL)   COMPREHENSIVE METABOLIC PANEL     Status: Abnormal   Collection Time   06/12/11  7:56 PM      Component Value Range Comment   Sodium 140  135 - 145 (mEq/L)    Potassium 3.7  3.5 - 5.1 (mEq/L)    Chloride 104  96 - 112 (mEq/L)    CO2 28  19 - 32 (mEq/L)    Glucose, Bld 102 (*) 70 - 99 (mg/dL)    BUN 12  6 - 23 (mg/dL)    Creatinine, Ser 7.82  0.50 - 1.35 (mg/dL)    Calcium 9.6  8.4 - 10.5 (mg/dL)    Total Protein 7.5  6.0 - 8.3 (g/dL)    Albumin 3.7  3.5 - 5.2 (g/dL)    AST 21  0 - 37 (U/L)    ALT 16  0 - 53 (U/L)    Alkaline Phosphatase 46  39 - 117 (U/L)    Total Bilirubin 0.3  0.3 - 1.2 (mg/dL)    GFR calc non Af Amer >90  >90 (mL/min)    GFR calc Af Amer >90  >90 (mL/min)   LIPASE, BLOOD     Status: Normal   Collection Time   06/12/11  7:56 PM      Component Value  Range Comment   Lipase 28  11 - 59 (U/L)   URINALYSIS, ROUTINE W REFLEX MICROSCOPIC     Status: Abnormal   Collection Time   06/12/11  8:54 PM      Component Value Range Comment   Color, Urine YELLOW  YELLOW     APPearance CLEAR  CLEAR     Specific Gravity, Urine 1.037 (*) 1.005 - 1.030     pH 5.5  5.0 - 8.0     Glucose, UA NEGATIVE  NEGATIVE (mg/dL)    Hgb urine dipstick NEGATIVE  NEGATIVE     Bilirubin Urine NEGATIVE  NEGATIVE     Ketones, ur TRACE (*) NEGATIVE (mg/dL)    Protein, ur NEGATIVE  NEGATIVE (mg/dL)    Urobilinogen, UA 0.2  0.0 - 1.0 (mg/dL)    Nitrite NEGATIVE  NEGATIVE     Leukocytes, UA NEGATIVE  NEGATIVE  MICROSCOPIC NOT DONE ON URINES WITH NEGATIVE PROTEIN, BLOOD, LEUKOCYTES, NITRITE, OR GLUCOSE <1000  mg/dL.   Ct Abdomen Pelvis W Contrast  06/12/2011  *RADIOLOGY REPORT*  Clinical Data: Diarrhea.  Abdominal pain.  Symptoms for 3 days.  CT ABDOMEN AND PELVIS WITH CONTRAST  Technique:  Multidetector CT imaging of the abdomen and pelvis was performed following the standard protocol during bolus administration of intravenous contrast.  Contrast: 80mL OMNIPAQUE IOHEXOL 300 MG/ML IV SOLN  Comparison: 02/14/2006  Findings: There are small locules of free intraperitoneal air, primarily within the upper abdomen.  These are seen around the diaphragm, around the upper portion of the liver, anterior to the stomach, adjacent to the duodenal bulb.  A few locules are identified in the retroperitoneal region but the majority are intraperitoneal.  The stomach wall is unremarkable in appearance.  Mildly prominent duodenal bulb on early images, normal appearance on delayed images.  The terminal ileum appears thick-walled without surrounding stranding.  The length of the involved segment appears to be approximately 5 cm.  No focal abnormality identified within the liver, spleen, adrenal glands, or kidneys.  The gallbladder is contracted.  The appendix is well seen and has a normal appearance.  Colonic loops are normal in caliber and wall thickness.  There are scattered colonic diverticula but no CT evidence for acute diverticulitis.  No retroperitoneal or mesenteric adenopathy. No evidence for aortic aneurysm. Visualized osseous structures have a normal appearance.  IMPRESSION:  1.  Small amount of intraperitoneal air primarily seen within the upper abdomen.  The findings are consistent with perforated viscus. The differential diagnosis would include a perforated stomach, duodenum, or colon.  However, no other associated CT findings are identified to identify the source of the air. 2.  Thickened terminal ileum, consistent with ileitis.  Consider evaluation for Crohn disease. 3.  No evidence for abscess, bowel obstruction. 4.  No  evidence for acute appendicitis.  Critical test results telephoned to Dr. Clarene Duke at the time of interpretation on date 06/12/2011 at time 11:43 p.m.  Original Report Authenticated By: Patterson Hammersmith, M.D.    Review of Systems  Constitutional: Negative.   HENT: Negative.   Eyes: Negative.   Respiratory: Negative.   Cardiovascular: Negative.   Gastrointestinal: Negative.   Genitourinary: Negative.   Musculoskeletal: Positive for falls.       Left rib pain after fall one week ago  Skin: Negative.   Neurological: Negative.   Endo/Heme/Allergies: Negative.   Psychiatric/Behavioral: Positive for depression, suicidal ideas and substance abuse.    Blood pressure 130/79, pulse 75, temperature 98.4 F (36.9  C), temperature source Oral, resp. rate 15, height 5\' 8"  (1.727 m), weight 143 lb (64.864 kg), SpO2 96.00%. Physical Exam  Constitutional: He appears well-developed and well-nourished.  HENT:  Head: Normocephalic and atraumatic.  Right Ear: External ear normal.  Left Ear: External ear normal.  Nose: Nose normal.  Mouth/Throat: Oropharynx is clear and moist.  Eyes: Conjunctivae and EOM are normal. Pupils are equal, round, and reactive to light.  Neck: Normal range of motion. Neck supple. No thyromegaly present.  Cardiovascular: Normal rate, regular rhythm, normal heart sounds and intact distal pulses.   Respiratory: Effort normal and breath sounds normal. No respiratory distress. He has no wheezes. He has no rales. He exhibits no tenderness.  GI: Soft. Bowel sounds are normal. He exhibits no distension and no mass. There is no tenderness. There is no rebound and no guarding.  Musculoskeletal: Normal range of motion.  Lymphadenopathy:    He has no cervical adenopathy.  Neurological: He is alert.  Skin: Skin is warm and dry.  Psychiatric:       Flat affect, slowed speech     Assessment/Plan 1.  Free intraperitoneal air from unknown source / unknown significance -  Patient  essentially asymptomatic.  Possible recent trauma to torso.  Consider perforated PUD.  Will admit to general surgery for observation.  Will start empiric abx.  Will treat with PPI for possible PUD.  2.  Diarrhea.  Will check stool for fecal leukocytes and Cdiff.  3.  Substance abuse / suicidal ideation - per behavioral health.  Will request sitter.  Suicide precautions.  Velora Heckler, MD, FACS General & Endocrine Surgery [facility]   Trenden Hazelrigg M 06/13/2011, 12:49 AM

## 2011-06-13 NOTE — ED Notes (Signed)
Patient changed from his normal clothing to blue scrubs. Security called to wand patient. Belongings (4 bags) placed at ED RN station.

## 2011-06-13 NOTE — ED Notes (Signed)
Phlebotomy in with patient 

## 2011-06-13 NOTE — ED Notes (Signed)
Pt a/ox3, denies pain, iv site is unremarkable

## 2011-06-13 NOTE — ED Notes (Signed)
Pt asleep at this time, blinds open, sitter left bedside. No issues at this time, will cont to monitor.

## 2011-06-13 NOTE — ED Notes (Signed)
Pt awaiting admission

## 2011-06-13 NOTE — ED Notes (Signed)
protonix not given; julie f. rn witnessed; realized that he got his protonix at 0800 and wasted the protonix i drew up

## 2011-06-13 NOTE — Progress Notes (Addendum)
2300 RN note: [facility] location  Writer was informed that pt was admitted to [facility] for surgery related to an abnormal findings on his CT scan performed this evening. Pt belongings from locker 122 was retrieved with seal intact. Room belongings have been placed in brown bag and stapled. Room belongings documented by admitting nurse on admission at [name]. [employer] Engineer, materials contacted for retrieval of patient's belongings.

## 2011-06-13 NOTE — ED Notes (Signed)
Notified pt of stool sample request

## 2011-06-13 NOTE — ED Notes (Signed)
Pt sts he does not want to be admitted to [facility] long hospital; pt sts he only vomited once and his last diarrhea bm was this am

## 2011-06-13 NOTE — ED Notes (Signed)
Pt sts that he is tired of not getting what he needs here. sts that he normally takes vicodin and is not getting it- also that he wants "real food" and is tired of not being able to eat. Pt sts that he is about to leave. Pt told that his medications are very important at this time and refused his gabapentin and Effexor. Pt told the use of this medication and why he needs it at this time- pt still refused.

## 2011-06-13 NOTE — ED Notes (Signed)
Report given to [employer] on 5E

## 2011-06-13 NOTE — Progress Notes (Signed)
Patient ID: [name], male   DOB: [birth year], 41 y.o.   MRN: [id number]  General Surgery - [facility] - Progress Note  HD# 2  Subjective: Patient in transitional care unit waiting for inpatient bed to be available.  No complaints.  No pain.  No nausea.  Diarrhea appears to have resolved.  Hungry.  States he does not want to stay in hospital any longer.  Objective: Vital signs in last 24 hours: Temp:  [98.4 F (36.9 C)-98.9 F (37.2 C)] 98.8 F (37.1 C) (02/09 0753) Pulse Rate:  [72-96] 72  (02/09 0753) Resp:  [15-100] 16  (02/09 0753) BP: (104-130)/(60-80) 104/60 mmHg (02/09 0753) SpO2:  [96 %-97 %] 97 % (02/09 0753) Last BM Date: 06/11/11  Intake/Output from previous day:    Exam: HEENT - clear, not icteric Neck - soft Chest - clear bilaterally Cor - RRR, no murmur Abd - soft without distension; BS present; non-tender Ext - no significant edema Neuro - grossly intact, no focal deficits  Lab Results:   Basename 06/13/11 0504 06/12/11 1956  WBC 7.3 7.4  HGB 12.2* 11.4*  HCT 37.1* 35.0*  PLT 279 256     Basename 06/12/11 1956 06/11/11 1957  NA 140 140  K 3.7 3.9  CL 104 106  CO2 28 26  GLUCOSE 102* 96  BUN 12 9  CREATININE 0.87 0.83  CALCIUM 9.6 9.6    Studies/Results: Ct Abdomen Pelvis W Contrast  06/12/2011  *RADIOLOGY REPORT*  Clinical Data: Diarrhea.  Abdominal pain.  Symptoms for 3 days.  CT ABDOMEN AND PELVIS WITH CONTRAST  Technique:  Multidetector CT imaging of the abdomen and pelvis was performed following the standard protocol during bolus administration of intravenous contrast.  Contrast: 80mL OMNIPAQUE IOHEXOL 300 MG/ML IV SOLN  Comparison: 02/14/2006  Findings: There are small locules of free intraperitoneal air, primarily within the upper abdomen.  These are seen around the diaphragm, around the upper portion of the liver, anterior to the stomach, adjacent to the duodenal bulb.  A few locules are identified in the  retroperitoneal region but the majority are intraperitoneal.  The stomach wall is unremarkable in appearance.  Mildly prominent duodenal bulb on early images, normal appearance on delayed images.  The terminal ileum appears thick-walled without surrounding stranding.  The length of the involved segment appears to be approximately 5 cm.  No focal abnormality identified within the liver, spleen, adrenal glands, or kidneys.  The gallbladder is contracted.  The appendix is well seen and has a normal appearance.  Colonic loops are normal in caliber and wall thickness.  There are scattered colonic diverticula but no CT evidence for acute diverticulitis.  No retroperitoneal or mesenteric adenopathy. No evidence for aortic aneurysm. Visualized osseous structures have a normal appearance.  IMPRESSION:  1.  Small amount of intraperitoneal air primarily seen within the upper abdomen.  The findings are consistent with perforated viscus. The differential diagnosis would include a perforated stomach, duodenum, or colon.  However, no other associated CT findings are identified to identify the source of the air. 2.  Thickened terminal ileum, consistent with ileitis.  Consider evaluation for Crohn disease. 3.  No evidence for abscess, bowel obstruction. 4.  No evidence for acute appendicitis.  Critical test results telephoned to Dr. Clarene Duke at the time of interpretation on date 06/12/2011 at time 11:43 p.m.  Original Report Authenticated By: Patterson Hammersmith, M.D.    Assessment: Incidental finding of free intraperitoneal air on CT scan of  abdomen, recent episode diarrhea  Plan: Will allow clear liquid diet Continue PPI Continue empiric Unasyn IV Admit to med-surg bed if patient will allow Consider repeat CT scan of abdomen in 48-72 hours if clinically stable Stool studies pending   Velora Heckler, MD, FACS General & Endocrine Surgery [facility]  06/13/2011

## 2011-06-13 NOTE — ED Notes (Signed)
Lunch ordered 

## 2011-06-14 ENCOUNTER — Inpatient Hospital Stay (HOSPITAL_COMMUNITY): Payer: Self-pay

## 2011-06-14 LAB — URINE CULTURE
Colony Count: NO GROWTH
Culture: NO GROWTH

## 2011-06-14 LAB — CBC
MCH: 31.8 pg (ref 26.0–34.0)
MCHC: 33.2 g/dL (ref 30.0–36.0)
MCV: 95.8 fL (ref 78.0–100.0)
Platelets: 283 10*3/uL (ref 150–400)
RDW: 13.2 % (ref 11.5–15.5)

## 2011-06-14 LAB — CLOSTRIDIUM DIFFICILE BY PCR: Toxigenic C. Difficile by PCR: NEGATIVE

## 2011-06-14 LAB — CREATININE, SERUM: GFR calc Af Amer: >90 mL/min (ref >90)

## 2011-06-14 LAB — POTASSIUM: Potassium: 4 mEq/L (ref 3.5–5.1)

## 2011-06-14 LAB — FECAL LACTOFERRIN, QUANT

## 2011-06-14 MED ORDER — BISACODYL 10 MG RE SUPP: 10.0000 mg | Freq: Two times a day (BID) | RECTAL | Status: DC | PRN

## 2011-06-14 MED ORDER — PSYLLIUM 95 % PO PACK
1.0000 | PACK | Freq: Two times a day (BID) | ORAL | Status: DC
  Administered 2011-06-14 – 2011-06-15 (×4): 1 via ORAL
  Filled 2011-06-14 (×7): qty 1

## 2011-06-14 MED ORDER — LORAZEPAM 1 MG PO TABS
1.0000 mg | ORAL_TABLET | Freq: Two times a day (BID) | ORAL | Status: DC
  Administered 2011-06-14 – 2011-06-15 (×3): 1 mg via ORAL
  Filled 2011-06-14 (×3): qty 1

## 2011-06-14 MED ORDER — ALUM & MAG HYDROXIDE-SIMETH 200-200-20 MG/5ML PO SUSP: 30.0000 mL | Freq: Four times a day (QID) | ORAL | Status: DC | PRN

## 2011-06-14 MED ORDER — IOHEXOL 300 MG/ML  SOLN
100.0000 mL | Freq: Once | INTRAMUSCULAR | Status: AC | PRN
  Administered 2011-06-14: 100 mL via INTRAVENOUS

## 2011-06-14 MED ORDER — VITAMIN B-1 100 MG PO TABS
100.0000 mg | ORAL_TABLET | Freq: Every day | ORAL | Status: DC
  Administered 2011-06-14 – 2011-06-15 (×2): 100 mg via ORAL
  Filled 2011-06-14 (×3): qty 1

## 2011-06-14 MED ORDER — HYDROCODONE-ACETAMINOPHEN 5-325 MG PO TABS: 1.0000 | ORAL_TABLET | ORAL | Status: DC | PRN

## 2011-06-14 MED ORDER — GABAPENTIN 600 MG PO TABS
600.0000 mg | ORAL_TABLET | Freq: Three times a day (TID) | ORAL | Status: DC
  Administered 2011-06-14 – 2011-06-16 (×7): 600 mg via ORAL
  Filled 2011-06-14 (×10): qty 1

## 2011-06-14 MED ORDER — ACETAMINOPHEN 325 MG PO TABS: 650.0000 mg | ORAL_TABLET | Freq: Four times a day (QID) | ORAL | Status: DC | PRN

## 2011-06-14 MED ORDER — VENLAFAXINE HCL ER 75 MG PO CP24
75.0000 mg | ORAL_CAPSULE | Freq: Every day | ORAL | Status: DC
  Administered 2011-06-14 – 2011-06-15 (×2): 75 mg via ORAL
  Filled 2011-06-14 (×3): qty 1

## 2011-06-14 MED ORDER — FLORA-Q PO CAPS
1.0000 | ORAL_CAPSULE | Freq: Every day | ORAL | Status: DC
  Administered 2011-06-14 – 2011-06-15 (×2): 1 via ORAL
  Filled 2011-06-14 (×3): qty 1

## 2011-06-14 MED ORDER — TRAZODONE HCL 100 MG PO TABS
100.0000 mg | ORAL_TABLET | Freq: Every day | ORAL | Status: DC
  Administered 2011-06-14 – 2011-06-15 (×2): 100 mg via ORAL
  Filled 2011-06-14 (×3): qty 1

## 2011-06-14 MED ORDER — HYDROCODONE-ACETAMINOPHEN 5-325 MG PO TABS
1.0000 | ORAL_TABLET | Freq: Four times a day (QID) | ORAL | Status: DC | PRN
  Administered 2011-06-14 – 2011-06-16 (×4): 2 via ORAL
  Filled 2011-06-14 (×4): qty 2

## 2011-06-14 MED ORDER — ADULT MULTIVITAMIN W/MINERALS CH
1.0000 | ORAL_TABLET | Freq: Every day | ORAL | Status: DC
  Administered 2011-06-14 – 2011-06-16 (×3): 1 via ORAL
  Filled 2011-06-14 (×3): qty 1

## 2011-06-14 MED ORDER — RISPERIDONE 3 MG PO TABS
3.0000 mg | ORAL_TABLET | Freq: Every day | ORAL | Status: DC
  Administered 2011-06-14 – 2011-06-15 (×2): 3 mg via ORAL
  Filled 2011-06-14 (×3): qty 1

## 2011-06-14 NOTE — Progress Notes (Signed)
Patient ID: [name], male   DOB: 1970-08-24, 41 y.o.   MRN: [id number]  General Surgery - [facility] - Progress Note  HD# 3   Subjective: Patient without complaint.  Wants to eat.  Wants to leave hospital.  Normal BM this AM.  No diarrhea.  Objective: Vital signs in last 24 hours: Temp:  [98 F (36.7 C)-99 F (37.2 C)] 98.4 F (36.9 C) (02/10 0630) Pulse Rate:  [62-86] 62  (02/10 0630) Resp:  [16-20] 16  (02/10 0630) BP: (118-146)/(77-93) 146/93 mmHg (02/10 0630) SpO2:  [98 %-99 %] 98 % (02/10 0630) Weight:  [136 lb 14.5 oz (62.1 kg)] 136 lb 14.5 oz (62.1 kg) (02/10 0600) Last BM Date: 06/14/11  Intake/Output from previous day: 02/09 0701 - 02/10 0700 In: 120 [P.O.:120] Out: -   Exam: HEENT - clear, not icteric Neck - soft Chest - clear bilaterally Cor - RRR, no murmur Abd - soft without distension; BS present; no tenderness; no mass Ext - no significant edema Neuro - grossly intact, no focal deficits  Lab Results:   Basename 06/14/11 0535 06/13/11 0504  WBC 7.3 7.3  HGB 12.2* 12.2*  HCT 36.8* 37.1*  PLT 283 279     Basename 06/14/11 0535 06/12/11 1956 06/11/11 1957  NA -- 140 140  K 4.0 3.7 --  CL -- 104 106  CO2 -- 28 26  GLUCOSE -- 102* 96  BUN -- 12 9  CREATININE 0.94 0.87 --  CALCIUM -- 9.6 9.6    Studies/Results: Ct Abdomen Pelvis W Contrast  06/12/2011  *RADIOLOGY REPORT*  Clinical Data: Diarrhea.  Abdominal pain.  Symptoms for 3 days.  CT ABDOMEN AND PELVIS WITH CONTRAST  Technique:  Multidetector CT imaging of the abdomen and pelvis was performed following the standard protocol during bolus administration of intravenous contrast.  Contrast: 80mL OMNIPAQUE IOHEXOL 300 MG/ML IV SOLN  Comparison: 02/14/2006  Findings: There are small locules of free intraperitoneal air, primarily within the upper abdomen.  These are seen around the diaphragm, around the upper portion of the liver, anterior to the stomach, adjacent to the  duodenal bulb.  A few locules are identified in the retroperitoneal region but the majority are intraperitoneal.  The stomach wall is unremarkable in appearance.  Mildly prominent duodenal bulb on early images, normal appearance on delayed images.  The terminal ileum appears thick-walled without surrounding stranding.  The length of the involved segment appears to be approximately 5 cm.  No focal abnormality identified within the liver, spleen, adrenal glands, or kidneys.  The gallbladder is contracted.  The appendix is well seen and has a normal appearance.  Colonic loops are normal in caliber and wall thickness.  There are scattered colonic diverticula but no CT evidence for acute diverticulitis.  No retroperitoneal or mesenteric adenopathy. No evidence for aortic aneurysm. Visualized osseous structures have a normal appearance.  IMPRESSION:  1.  Small amount of intraperitoneal air primarily seen within the upper abdomen.  The findings are consistent with perforated viscus. The differential diagnosis would include a perforated stomach, duodenum, or colon.  However, no other associated CT findings are identified to identify the source of the air. 2.  Thickened terminal ileum, consistent with ileitis.  Consider evaluation for Crohn disease. 3.  No evidence for abscess, bowel obstruction. 4.  No evidence for acute appendicitis.  Critical test results telephoned to Dr. Clarene Duke at the time of interpretation on date 06/12/2011 at time 11:43 p.m.  Original Report Authenticated By:  Patterson Hammersmith, M.D.    Assessment: Benign abdominal exam Resolved diarrhea CT scan with pneumoperitoneum of unknown etiology  Plan: Will repeat CT abd today If no acute findings, will advance diet and plan for discharge Patient agrees to stay for CT scan of abdomen   Velora Heckler, MD, FACS General & Endocrine Surgery [facility]  06/14/2011

## 2011-06-15 MED ORDER — PANTOPRAZOLE SODIUM 40 MG PO TBEC
40.0000 mg | DELAYED_RELEASE_TABLET | Freq: Two times a day (BID) | ORAL | Status: DC
  Administered 2011-06-15: 40 mg via ORAL
  Filled 2011-06-15 (×4): qty 1
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
## 2011-06-15 NOTE — Progress Notes (Signed)
Subjective: Pt. Denies abdominal pain, says he needs to go to [facility] to clean out his locker and then wants to go home by himself.  No Admitted with 3 day history of diarrhea and some loculations of free air now resolved. Diarrhea reported much better than on admission. Objective: Vital signs in last 24 hours: Temp:  [98.1 F (36.7 C)-99 F (37.2 C)] 98.1 F (36.7 C) (02/11 0600) Pulse Rate:  [67-85] 72  (02/11 0600) Resp:  [16-20] 18  (02/11 0600) BP: (106-165)/(52-100) 106/52 mmHg (02/11 0600) SpO2:  [93 %-99 %] 95 % (02/11 0600) Last BM Date: 06/14/11  Intake/Output from previous day: 02/10 0701 - 02/11 0700 In: 3579.6 [P.O.:2160; I.V.:1419.6] Out: 3 [Stool:3] Intake/Output this shift:    PE: Sleeping when I went in.. Wakes up and is alert. Sitter in Room.  Abd:  He has a binder on, paper clothing, No pain or tenderness.  No distention +BS, + stools yesterday  Lab Results:   Basename 06/14/11 0535 06/13/11 0504  WBC 7.3 7.3  HGB 12.2* 12.2*  HCT 36.8* 37.1*  PLT 283 279     Basename 06/14/11 0535 06/12/11 1956  NA -- 140  K 4.0 3.7  CL -- 104  CO2 -- 28  GLUCOSE -- 102*  BUN -- 12  CREATININE 0.94 0.87  CALCIUM -- 9.6    Lab 06/12/11 1956  AST 21  ALT 16  ALKPHOS 46  BILITOT 0.3  PROT 7.5  ALBUMIN 3.7    PT/INR No results found for this basename: LABPROT:2,INR:2 in the last 72 hours   Studies/Results: Ct Abdomen Pelvis W Contrast  06/14/2011  *RADIOLOGY REPORT*  Clinical Data: Follow-up pneumoperitoneum  CT ABDOMEN AND PELVIS WITH CONTRAST  Technique:  Multidetector CT imaging of the abdomen and pelvis was performed following the standard protocol during bolus administration of intravenous contrast.  Contrast: OMNIPAQUE IOHEXOL 300 MG/ML IV SOLN  Comparison: 06/12/2011  Findings: Lung bases are essentially clear.  Liver, spleen, pancreas, and adrenal glands within normal limits.  Gallbladder unremarkable.  No intrahepatic or extrahepatic ductal  dilatation.  Kidneys within normal limits.  No hydronephrosis.  No evidence of bowel obstruction.  Normal appendix.  Terminal ileum is mildly thick-walled but underdistended.  No colonic wall thickening or inflammatory changes.  No evidence of abdominal aortic aneurysm.  No free air is seen.  Small volume pelvic ascites (series 2/image 62).  Small mesenteric/retroperitoneal lymph nodes which do not meet pathologic CT size criteria.  Prostate is unremarkable.  Bladder is within normal limits.  Tiny fat-containing umbilical hernia.  Visualized osseous structures are within normal limits.  IMPRESSION: Resolution of prior tiny foci of pneumoperitoneum.  The etiology of the prior finding remains clear.  Terminal ileum is mildly thick-walled but underdistended.  No evidence of bowel obstruction.  Normal appendix.  No colonic wall thickening or inflammatory changes.  Original Report Authenticated By: Charline Bills, M.D.    Anti-infectives: Anti-infectives     Start     Dose/Rate Route Frequency Ordered Stop   06/13/11 2000   Ampicillin-Sulbactam (UNASYN) 3 g in sodium chloride 0.9 % 100 mL IVPB  Status:  Discontinued        3 g 100 mL/hr over 60 Minutes Intravenous Every 6 hours 06/13/11 1847 06/14/11 1723         Current Facility-Administered Medications  Medication Dose Route Frequency Provider Last Rate Last Dose  . acetaminophen (TYLENOL) tablet 650 mg  650 mg Oral Q6H PRN Ardeth Sportsman, MD      .  alum & mag hydroxide-simeth (MAALOX/MYLANTA) 200-200-20 MG/5ML suspension 30 mL  30 mL Oral Q6H PRN Ardeth Sportsman, MD      . bisacodyl (DULCOLAX) suppository 10 mg  10 mg Rectal Q12H PRN Ardeth Sportsman, MD      . dextrose 5 % and 0.45 % NaCl with KCl 20 mEq/L infusion   Intravenous Continuous Velora Heckler, MD 125 mL/hr at 06/14/11 2313    . Flora-Q (FLORA-Q) Capsule 1 capsule  1 capsule Oral Daily Ardeth Sportsman, MD   1 capsule at 06/14/11 1004  . gabapentin (NEURONTIN) tablet 600 mg  600 mg  Oral Q8H Ardeth Sportsman, MD   600 mg at 06/15/11 1610  . HYDROcodone-acetaminophen (NORCO) 5-325 MG per tablet 1-2 tablet  1-2 tablet Oral Q6H PRN Ardeth Sportsman, MD   2 tablet at 06/14/11 1137  . influenza  inactive virus vaccine (FLUZONE/FLUARIX) injection 0.5 mL  0.5 mL Intramuscular Tomorrow-1000 Velora Heckler, MD      . iohexol (OMNIPAQUE) 300 MG/ML solution 100 mL  100 mL Intravenous Once PRN Medication Radiologist, MD   100 mL at 06/14/11 1224  . LORazepam (ATIVAN) tablet 1 mg  1 mg Oral BID Velora Heckler, MD   1 mg at 06/14/11 2208  . mulitivitamin with minerals tablet 1 tablet  1 tablet Oral Daily Ardeth Sportsman, MD   1 tablet at 06/14/11 1004  . pantoprazole (PROTONIX) EC tablet 40 mg  40 mg Oral BID Loma Messing [place], PHARMD      . psyllium (HYDROCIL/METAMUCIL) packet 1 packet  1 packet Oral BID Ardeth Sportsman, MD   1 packet at 06/14/11 2208  . risperiDONE (RISPERDAL) tablet 3 mg  3 mg Oral QHS Ardeth Sportsman, MD   3 mg at 06/14/11 2209  . thiamine (VITAMIN B-1) tablet 100 mg  100 mg Oral Daily Ardeth Sportsman, MD   100 mg at 06/14/11 1004  . traZODone (DESYREL) tablet 100 mg  100 mg Oral QHS Ardeth Sportsman, MD   100 mg at 06/14/11 2209  . venlafaxine (EFFEXOR-XR) 24 hr capsule 75 mg  75 mg Oral Daily Ardeth Sportsman, MD   75 mg at 06/14/11 1004  . DISCONTD: Ampicillin-Sulbactam (UNASYN) 3 g in sodium chloride 0.9 % 100 mL IVPB  3 g Intravenous Q6H Velora Heckler, MD   3 g at 06/14/11 1348  . DISCONTD: pantoprazole (PROTONIX) injection 40 mg  40 mg Intravenous Q12H Velora Heckler, MD   40 mg at 06/14/11 2208    Assessment/Plan 1. Free intraperitoneal air from unknown source / unknown significance - Patient essentially asymptomatic. Possible recent trauma to torso. Consider perforated PUD. Will admit to general surgery for observation. PUD. CT scan shows resolution of air. 2. Diarrhea. Will check stool for fecal leukocytes and Cdiff.  3. Substance abuse / suicidal ideation -  per behavioral health.  Suicide precautions. 4.(negitive C. Diff, positive Fecal lactoferrin) Possible IBS   Plan:  I will ask case manager what plan is for pt.  Does he need to return to [facility], and who will be responsible for his psyc. Meds?  Will arrange transfer back to [facility], or D/C when I have more information.     LOS: 2 days    Nakiea Metzner 06/15/2011

## 2011-06-15 NOTE — Progress Notes (Signed)
Protonix changed from IV-PO per [employer] policy.    Norely Schlick, Loma Messing PharmD 7:34 AM 06/15/2011
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
## 2011-06-15 NOTE — ED Notes (Signed)
5:15 pmED CSW was called to meet with pt for possible discharge home after being seen by psychiatry. At this time there is no documentation from the psychiatrist with discharge recommendations. Dr. Marlyne Beards contacted ED CSW in regards to needs for the pt to discharge. ED CSW explained that if the pt is cleared from psychiatry he can follow up with [facility]) for medication/outpatient psychiatric services.   5:35ED CSW met with pt to assess current needs. ED CSW made aware that the pt does not have any active prescriptions that are filled at this time. Pt reports taking hydrocodone, trazodone and librium. Pt reports that he has been living with 2 friends that are able to provide transportation at his time of discharge. Pt states that all of his belongings are still at [facility] and he will need to have those returned to him.   5:55pm ED CSW contacted Minerva Areola [facility]) to inquire as to how pt's belongings can be returned. Per Minerva Areola, security will collect the pt's belongings from [facility] and return them to the pt while he is still at [facility].  6:00pm ED CSW contacted Dr. Marlyne Beards and expressed that there is still no documentation from the psychiatrist as well as any discharge summary. Per Dr. Marlyne Beards, since there is no documentation from the psychiatrist at this time and the pt does not have any active prescriptions, pt's discharge will be held until 06/16/11. Inpatient CSW to follow up with pt on 06/16/11 to ensure a safe discharge plan.

## 2011-06-16 DIAGNOSIS — F209 Schizophrenia, unspecified: Secondary | ICD-10-CM

## 2011-06-16 MED ORDER — BENZTROPINE MESYLATE 0.5 MG PO TABS
0.5000 mg | ORAL_TABLET | Freq: Two times a day (BID) | ORAL | Status: DC
  Administered 2011-06-16: 0.5 mg via ORAL
  Filled 2011-06-16 (×2): qty 1

## 2011-06-16 MED ORDER — BENZTROPINE MESYLATE 0.5 MG PO TABS: 0.5000 mg | ORAL_TABLET | Freq: Two times a day (BID) | ORAL | Status: DC

## 2011-06-16 MED ORDER — ACETAMINOPHEN 325 MG PO TABS: 650.0000 mg | ORAL_TABLET | Freq: Four times a day (QID) | ORAL | Status: DC | PRN

## 2011-06-16 MED ORDER — HYDROCODONE-ACETAMINOPHEN 5-325 MG PO TABS: 1.0000 | ORAL_TABLET | Freq: Four times a day (QID) | ORAL | Status: AC | PRN

## 2011-06-16 NOTE — Progress Notes (Signed)
Subjective: Feel fine waiting for arrangements for discharge  Objective: Vital signs in last 24 hours: Temp:  [97.3 F (36.3 C)-98.7 F (37.1 C)] 97.7 F (36.5 C) (02/12 0700) Pulse Rate:  [76-81] 81  (02/12 0700) Resp:  [16-18] 16  (02/12 0700) BP: (127-145)/(79-87) 137/79 mmHg (02/12 0700) SpO2:  [91 %-98 %] 96 % (02/12 0700) Last BM Date: 06/14/11  Intake/Output from previous day: 02/11 0701 - 02/12 0700 In: 3024 [P.O.:720; I.V.:2304] Out: 3 [Urine:3] Intake/Output this shift:    PE:  Alert,  No complaints  Abd: nontender, denies further diarrhea  Lab Results:   [facility] 06/14/11 0535  WBC 7.3  HGB 12.2*  HCT 36.8*  PLT 283    BMET  Basename 06/14/11 0535  NA --  K 4.0  CL --  CO2 --  GLUCOSE --  BUN --  CREATININE 0.94  CALCIUM --   PT/INR No results found for this basename: LABPROT:2,INR:2 in the last 72 hours   Studies/Results: Ct Abdomen Pelvis W Contrast  06/14/2011  *RADIOLOGY REPORT*  Clinical Data: Follow-up pneumoperitoneum  CT ABDOMEN AND PELVIS WITH CONTRAST  Technique:  Multidetector CT imaging of the abdomen and pelvis was performed following the standard protocol during bolus administration of intravenous contrast.  Contrast: OMNIPAQUE IOHEXOL 300 MG/ML IV SOLN  Comparison: 06/12/2011  Findings: Lung bases are essentially clear.  Liver, spleen, pancreas, and adrenal glands within normal limits.  Gallbladder unremarkable.  No intrahepatic or extrahepatic ductal dilatation.  Kidneys within normal limits.  No hydronephrosis.  No evidence of bowel obstruction.  Normal appendix.  Terminal ileum is mildly thick-walled but underdistended.  No colonic wall thickening or inflammatory changes.  No evidence of abdominal aortic aneurysm.  No free air is seen.  Small volume pelvic ascites (series 2/image 62).  Small mesenteric/retroperitoneal lymph nodes which do not meet pathologic CT size criteria.  Prostate is unremarkable.  Bladder is within normal  limits.  Tiny fat-containing umbilical hernia.  Visualized osseous structures are within normal limits.  IMPRESSION: Resolution of prior tiny foci of pneumoperitoneum.  The etiology of the prior finding remains clear.  Terminal ileum is mildly thick-walled but underdistended.  No evidence of bowel obstruction.  Normal appendix.  No colonic wall thickening or inflammatory changes.  Original Report Authenticated By: Charline Bills, M.D.    Anti-infectives: Anti-infectives     Start     Dose/Rate Route Frequency Ordered Stop   06/13/11 2000   Ampicillin-Sulbactam (UNASYN) 3 g in sodium chloride 0.9 % 100 mL IVPB  Status:  Discontinued        3 g 100 mL/hr over 60 Minutes Intravenous Every 6 hours 06/13/11 1847 06/14/11 1723         Current Facility-Administered Medications  Medication Dose Route Frequency Provider Last Rate Last Dose  . acetaminophen (TYLENOL) tablet 650 mg  650 mg Oral Q6H PRN Ardeth Sportsman, MD      . alum & mag hydroxide-simeth (MAALOX/MYLANTA) 200-200-20 MG/5ML suspension 30 mL  30 mL Oral Q6H PRN Ardeth Sportsman, MD      . bisacodyl (DULCOLAX) suppository 10 mg  10 mg Rectal Q12H PRN Ardeth Sportsman, MD      . dextrose 5 % and 0.45 % NaCl with KCl 20 mEq/L infusion   Intravenous Continuous Velora Heckler, MD 125 mL/hr at 06/16/11 [phone]    . Flora-Q (FLORA-Q) Capsule 1 capsule  1 capsule Oral Daily Ardeth Sportsman, MD   1 capsule at 06/15/11 1020  .  gabapentin (NEURONTIN) tablet 600 mg  600 mg Oral Q8H Ardeth Sportsman, MD   600 mg at 06/16/11 1610  . HYDROcodone-acetaminophen (NORCO) 5-325 MG per tablet 1-2 tablet  1-2 tablet Oral Q6H PRN Ardeth Sportsman, MD   2 tablet at 06/16/11 0148  . influenza  inactive virus vaccine (FLUZONE/FLUARIX) injection 0.5 mL  0.5 mL Intramuscular Tomorrow-1000 Velora Heckler, MD      . LORazepam (ATIVAN) tablet 1 mg  1 mg Oral BID Velora Heckler, MD   1 mg at 06/15/11 2238  . mulitivitamin with minerals tablet 1 tablet  1 tablet Oral Daily  Ardeth Sportsman, MD   1 tablet at 06/15/11 1020  . pantoprazole (PROTONIX) EC tablet 40 mg  40 mg Oral BID Loma Messing [place], PHARMD   40 mg at 06/15/11 1020  . psyllium (HYDROCIL/METAMUCIL) packet 1 packet  1 packet Oral BID Ardeth Sportsman, MD   1 packet at 06/15/11 2237  . risperiDONE (RISPERDAL) tablet 3 mg  3 mg Oral QHS Ardeth Sportsman, MD   3 mg at 06/15/11 2237  . thiamine (VITAMIN B-1) tablet 100 mg  100 mg Oral Daily Ardeth Sportsman, MD   100 mg at 06/15/11 1020  . traZODone (DESYREL) tablet 100 mg  100 mg Oral QHS Ardeth Sportsman, MD   100 mg at 06/15/11 2238  . venlafaxine (EFFEXOR-XR) 24 hr capsule 75 mg  75 mg Oral Daily Ardeth Sportsman, MD   75 mg at 06/15/11 1020    Assessment/Plan. Free intraperitoneal air from unknown source / unknown significance - Patient essentially asymptomatic. Possible recent trauma to torso. Consider perforated PUD. Will admit to general surgery for observation.  PUD. CT scan shows resolution of air.  2. Diarrhea. Will check stool for fecal leukocytes and Cdiff.  3. Substance abuse / suicidal ideation - per behavioral health. Suicide precautions.  4.(negitive C. Diff, positive Fecal lactoferrin) Possible IBS 5.  AXIS I Schizophrenia rule out Schizoaffective Disorder, Alcohol abuse Alcohol abuse    .  Anemia    .  Severe major depression with psychotic features    AXIS IV Mental health, parenting, housing, finances  AXIS V GAF 50  Plan:  Pt to follow up at [facility].  I will give him prescriptions for 1 week of medicines reccommended by Dr. Ferol Luz.   He does not need follow up with general surgery.    LOS: 3 days    Clancy Mullarkey 06/16/2011
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
## 2011-06-16 NOTE — Progress Notes (Signed)
CARE MANAGEMENT NOTE 06/16/2011  Patient:  [facility]   Account Number:  [id number]  Date Initiated:  06/16/2011  Documentation initiated by:  Milca Sytsma  Subjective/Objective Assessment:   41 yo male admitted 06/13/11 with diarrhea     Action/Plan:   D/C when medically stable   Anticipated DC Date:  06/19/2011   Anticipated DC Plan:  HOME/SELF CARE  In-house referral  Clinical Social Worker      DC Planning Services  CM consult  Medication Assistance              Status of service:  Completed, signed off    Discharge Disposition:  HOME/SELF CARE    Comments:  06/16/11, Kathi Der RNC-MNN, BSN, [phone], CM received referral for medication assistance and follow up at Mental Health.  CM spoke with pharmacy to verify no indigent funds used in last 6 mos.  Three medications sent to pharmacy to be filled for pt.  Pt's nurse aware.  Referral made to CSW for follow up at Mental Health.

## 2011-06-16 NOTE — Progress Notes (Signed)
Spoke with Pt regarding d/c plans.  Pt reports that he's never been to [facility] but that he's agreeable to seeking tx from this facility.    Pt understands that he can present to [facility] M[facility]F from 8-3.  CSW explained the earlier in the morning, the better, as the likelihood that he can be seen by the psych MD is greater.  Pt voiced an understanding.  Notified RN, Care Coordinator and Case Manager.  Pt to be d/c'd.  Providence Crosby, LCSWA Clinical Social Work [phone]

## 2011-06-24 ENCOUNTER — Emergency Department (HOSPITAL_COMMUNITY)
Admission: EM | Admit: 2011-06-24 | Discharge: 2011-06-25 | Disposition: A | Discharge status: Home / Self Care | Payer: PRIVATE HEALTH INSURANCE | Attending: Emergency Medicine | Admitting: Emergency Medicine

## 2011-06-24 ENCOUNTER — Encounter (HOSPITAL_COMMUNITY): Payer: Self-pay | Admitting: *Deleted

## 2011-06-24 DIAGNOSIS — R443 Hallucinations, unspecified: Secondary | ICD-10-CM | POA: Insufficient documentation

## 2011-06-24 DIAGNOSIS — F329 Major depressive disorder, single episode, unspecified: Secondary | ICD-10-CM

## 2011-06-24 DIAGNOSIS — F17200 Nicotine dependence, unspecified, uncomplicated: Secondary | ICD-10-CM | POA: Insufficient documentation

## 2011-06-24 DIAGNOSIS — F323 Major depressive disorder, single episode, severe with psychotic features: Secondary | ICD-10-CM | POA: Insufficient documentation

## 2011-06-24 DIAGNOSIS — R45851 Suicidal ideations: Secondary | ICD-10-CM

## 2011-06-24 DIAGNOSIS — F1010 Alcohol abuse, uncomplicated: Secondary | ICD-10-CM | POA: Insufficient documentation

## 2011-06-24 LAB — ETHANOL: Alcohol, Ethyl (B): 300 mg/dL — ABNORMAL HIGH (ref 0–11)

## 2011-06-24 LAB — BASIC METABOLIC PANEL
Calcium: 9.6 mg/dL (ref 8.4–10.5)
Creatinine, Ser: 0.75 mg/dL (ref 0.50–1.35)
GFR calc Af Amer: >90 mL/min (ref >90)
GFR calc non Af Amer: >90 mL/min (ref >90)
Sodium: 139 mEq/L (ref 135–145)

## 2011-06-24 LAB — URINALYSIS, ROUTINE W REFLEX MICROSCOPIC
Bilirubin Urine: NEGATIVE
Ketones, ur: NEGATIVE mg/dL
Nitrite: NEGATIVE
Protein, ur: NEGATIVE mg/dL
Urobilinogen, UA: 0.2 mg/dL (ref 0.0–1.0)
pH: 5 (ref 5.0–8.0)

## 2011-06-24 LAB — RAPID URINE DRUG SCREEN, HOSP PERFORMED
Barbiturates: NOT DETECTED
Opiates: NOT DETECTED
Tetrahydrocannabinol: NOT DETECTED

## 2011-06-24 LAB — CBC
MCH: 32.2 pg (ref 26.0–34.0)
MCHC: 34.4 g/dL (ref 30.0–36.0)
Platelets: 333 10*3/uL (ref 150–400)
RBC: 4.22 MIL/uL (ref 4.22–5.81)
RDW: 13.6 % (ref 11.5–15.5)

## 2011-06-24 MED ORDER — IBUPROFEN 600 MG PO TABS: 600.0000 mg | ORAL_TABLET | Freq: Three times a day (TID) | ORAL | Status: DC | PRN

## 2011-06-24 MED ORDER — NICOTINE 21 MG/24HR TD PT24
21.0000 mg | MEDICATED_PATCH | Freq: Every day | TRANSDERMAL | Status: DC
  Filled 2011-06-24: qty 1

## 2011-06-24 MED ORDER — ALUM & MAG HYDROXIDE-SIMETH 200-200-20 MG/5ML PO SUSP: 30.0000 mL | ORAL | Status: DC | PRN

## 2011-06-24 MED ORDER — ZOLPIDEM TARTRATE 5 MG PO TABS: 5.0000 mg | ORAL_TABLET | Freq: Every evening | ORAL | Status: DC | PRN

## 2011-06-24 MED ORDER — RISPERIDONE 2 MG PO TABS
2.0000 mg | ORAL_TABLET | Freq: Every day | ORAL | Status: DC
  Administered 2011-06-25: 2 mg via ORAL
  Filled 2011-06-24: qty 1

## 2011-06-24 MED ORDER — ONDANSETRON HCL 4 MG PO TABS: 4.0000 mg | ORAL_TABLET | Freq: Three times a day (TID) | ORAL | Status: DC | PRN

## 2011-06-24 MED ORDER — LORAZEPAM 1 MG PO TABS: 1.0000 mg | ORAL_TABLET | Freq: Three times a day (TID) | ORAL | Status: DC | PRN

## 2011-06-24 NOTE — ED Provider Notes (Signed)
History     CSN: [id number]  Arrival date & time 06/24/11  2115   First MD Initiated Contact with Patient 06/24/11 2140      Chief Complaint  Patient presents with  . V70.1     HPI  History provided by the patient. Patient is a 41 year old male with past history of alcohol abuse and severe depression who presents with complaints of increasing depression, hallucinations and suicidal ideations. Patient arrived by [facility] and states she is just "tired"and wants to die. Patient states he just wants to go home and kill himself. He is not indicate a specific plan. Patient does report losing his prescription of Risperdal and has not taken this for several days. Patient does not elaborate on any other issues causing his increased depression or suicidal ideations. Patient has multiple visits to the emergency room for similar symptoms with admissions to [facility]. Patient also reports hearing voices and seeing things on the walls. Patient denies any SI attempt. Patient denies any homicidal ideations.    Past Medical History  Diagnosis Date  . Alcohol abuse   . Anemia   . Severe major depression with psychotic features     History reviewed. No pertinent past surgical history.  No family history on file.  History  Substance Use Topics  . Smoking status: Current Everyday Smoker -- 0.5 packs/day  . Smokeless tobacco: Not on file  . Alcohol Use: 50.4 oz/week    84 Cans of beer per week     heavy       Review of Systems  Psychiatric/Behavioral: Positive for suicidal ideas. Negative for self-injury.  All other systems reviewed and are negative.    Allergies  Sertraline  Home Medications   Current Outpatient Rx  Name Route Sig Dispense Refill  . ACETAMINOPHEN 325 MG PO TABS Oral Take 2 tablets (650 mg total) by mouth every 6 (six) hours as needed.    Marland Kitchen BENZTROPINE MESYLATE 0.5 MG PO TABS Oral Take 1 tablet (0.5 mg total) by mouth 2 (two) times daily. 14 tablet 0    You will need to  refill this at [facility].  Marland Kitchen HYDROCODONE-ACETAMINOPHEN 5-325 MG PO TABS Oral Take 1-2 tablets by mouth every 6 (six) hours as needed for pain. 30 tablet 0    You can use what you have at home.  Refills will n ...  . ADULT MULTIVITAMIN W/MINERALS CH Oral Take 1 tablet by mouth daily.    Marland Kitchen RISPERIDONE 3 MG PO TABS Oral Take 3 mg by mouth at bedtime.    . TRAZODONE HCL 100 MG PO TABS Oral Take 100 mg by mouth at bedtime.      BP 127/84  Pulse 102  Temp(Src) 98.6 F (37 C) (Oral)  Resp 18  SpO2 98%  Physical Exam  Nursing note and vitals reviewed. Constitutional: He is oriented to person, place, and time. He appears well-developed and well-nourished.  HENT:  Head: Normocephalic.  Cardiovascular: Normal rate and regular rhythm.   Pulmonary/Chest: Effort normal and breath sounds normal. No respiratory distress. He has no wheezes. He has no rales.  Abdominal: Soft. Bowel sounds are normal.  Neurological: He is alert and oriented to person, place, and time.  Skin: Skin is warm.  Psychiatric: His speech is normal. He is actively hallucinating. He exhibits a depressed mood. He expresses suicidal ideation. He expresses no homicidal ideation. He expresses no suicidal plans and no homicidal plans.    ED Course  Procedures    Results for  orders placed during the hospital encounter of 06/24/11  CBC      Component Value Range   WBC 7.3  4.0 - 10.5 (K/uL)   RBC 4.22  4.22 - 5.81 (MIL/uL)   Hemoglobin 13.6  13.0 - 17.0 (g/dL)   HCT 16.1  09.6 - 04.5 (%)   MCV 93.6  78.0 - 100.0 (fL)   MCH 32.2  26.0 - 34.0 (pg)   MCHC 34.4  30.0 - 36.0 (g/dL)   RDW 40.9  81.1 - 91.4 (%)   Platelets 333  150 - 400 (K/uL)  BASIC METABOLIC PANEL      Component Value Range   Sodium 139  135 - 145 (mEq/L)   Potassium 3.9  3.5 - 5.1 (mEq/L)   Chloride 101  96 - 112 (mEq/L)   CO2 26  19 - 32 (mEq/L)   Glucose, Bld 101 (*) 70 - 99 (mg/dL)   BUN 5 (*) 6 - 23 (mg/dL)   Creatinine, Ser 7.82  0.50 - 1.35 (mg/dL)     Calcium 9.6  8.4 - 10.5 (mg/dL)   GFR calc non Af Amer >90  >90 (mL/min)   GFR calc Af Amer >90  >90 (mL/min)  ETHANOL      Component Value Range   Alcohol, Ethyl (B) 300 (*) 0 - 11 (mg/dL)  URINE RAPID DRUG SCREEN (HOSP PERFORMED)      Component Value Range   Opiates NONE DETECTED  NONE DETECTED    Cocaine NONE DETECTED  NONE DETECTED    Benzodiazepines NONE DETECTED  NONE DETECTED    Amphetamines NONE DETECTED  NONE DETECTED    Tetrahydrocannabinol NONE DETECTED  NONE DETECTED    Barbiturates NONE DETECTED  NONE DETECTED   URINALYSIS, ROUTINE W REFLEX MICROSCOPIC      Component Value Range   Color, Urine YELLOW  YELLOW    APPearance CLEAR  CLEAR    Specific Gravity, Urine 1.010  1.005 - 1.030    pH 5.0  5.0 - 8.0    Glucose, UA NEGATIVE  NEGATIVE (mg/dL)   Hgb urine dipstick NEGATIVE  NEGATIVE    Bilirubin Urine NEGATIVE  NEGATIVE    Ketones, ur NEGATIVE  NEGATIVE (mg/dL)   Protein, ur NEGATIVE  NEGATIVE (mg/dL)   Urobilinogen, UA 0.2  0.0 - 1.0 (mg/dL)   Nitrite NEGATIVE  NEGATIVE    Leukocytes, UA NEGATIVE  NEGATIVE       1. Suicidal ideation   2. Depression       MDM  9:50 PM patient seen and evaluated. Patient in no acute distress.   Spoke with [facility] ACT team they will see patient and evaluate. Patient currently voluntary. Psych holding orders in place.     Angus Seller, PA 06/25/11 0530

## 2011-06-24 NOTE — ED Notes (Addendum)
Pt emotional, rambling in conversation, states he is ready to end it, does not voice a plan "tired of this shit" "I know one thing, if I can't get help, I'm going to end this shit tonight, I need help" Pointing to wall "Do you see them, green men, I'm the only one that is crazy"

## 2011-06-24 NOTE — Discharge Summary (Signed)
Physician Discharge Summary Note  Patient:  [name] is an 41 y.o., male MRN:  [id number] DOB:  December 31, 1970 Patient phone:  [phone] (home)  Patient address:   [address] [place] [address],   Date of Admission:  06/06/2011 Date of Discharge: transferred to ED and subsequently admitted.06/13/2011    Discharge Diagnoses: Principal Problem:  *Major depressive disorder, recurrent, severe with psychotic features Active Problems:  Alcohol dependence   Axis Diagnosis:   AXIS I:  MDD, recurrent, with psychotic features; Alcohol Dependence; Polysubstance Abuse AXIS II:  No diagnosis AXIS III:  Fx left 9th rib. Chronic Back pain, Diarrhea with abd cramping. Past Medical History  Diagnosis Date  . Alcohol abuse   . Anemia   . Severe major depression with psychotic features    AXIS IV:  Chronic social issues related to substance abuse AXIS V:  41-50 serious symptoms  Level of Care:  Transferred to ED for GI distress and subsequently admitted to Surgery Service  Hospital Course:  [name] presented in our emergency room complaining of auditory hallucinations with voices telling him to harm himself. He was intoxicated at the time with an alcohol level of 339 mg/dL. He reported that he been drinking 24-36 beers several times a week. And he was also homeless. He reported using alcohol since the age of 38 and longest period sober was unclear. He had been on our unit about one month earlier also for detox, but had not followed through with his treatment program.  He was admitted for acute stabilization unit and started on Risperdal 1 mg each bedtime to control auditory hallucinations and Neurontin for anxiety. He had fallen at home and was found to have a left fractured rib. He was started on Celebrex to control that pain, and the pain of his back from an old chronic injury.  He slept better on the trazodone, and auditory hallucinations were diminished on the Risperdal. He  was started on Zoloft for depression and on the second day he began to complain of some loose stools and on the third day of Zoloft, he complained of significant abdominal cramping and discomfort. At that point he was unable to keep food and fluids down,. We suspected that symptoms could be due to Zoloft, we felt the need for consult. He was transferred to the emergency room for evaluation made n.p.o. at that time. He was subsequently admitted to the surgery service for observation.  Consults:  Transferred to ED as noted above  Significant Diagnostic Studies:  Initial ETOH 339% and UDS + for benzodiazepines.   Discharge Vitals:   Blood pressure 124/77, pulse 73, temperature 98 F (36.7 C), temperature source Oral, resp. rate 20, height 5\' 8"  (1.727 m), weight 64.864 kg (143 lb), SpO2 98.00%.  Mental Status Exam: See Mental Status Examination and Suicide Risk Assessment completed by Attending Physician prior to discharge.  Discharge destination:  Emergency Room  Is patient on multiple antipsychotic therapies at discharge:  No   Has Patient had three or more failed trials of antipsychotic monotherapy by history:  No  Recommended Plan for Multiple Antipsychotic Therapies: N/A   All Medications were discontinued and patient was made NPO for transfer to the ED due to GI Distress.  Prior to transfer, patient had been on Zoloft about 72 hrs and we suspect symptoms may have been caused by adverse reaction to Zoloft.  Medications prior to transfer were as follows:   Zoloft 50 mg daily, for depression. Librium 25 mg and  tapering fashion, for alcohol detox. Gabapentin 600 mg 3 times a day, for anxiety. Celebrex 100 mg every morning and each bedtime, for chronic back pain and pain from left fractured rib. Risperdal 2 mg by mouth each bedtime. For psychosis. Trazodone 100 mg at bedtime when necessary for insomnia.   Follow-up Information    Follow up with Dr. Garald Balding on 06/23/2011. (Your  appointment with Dr. Duncan Dull is scheduled for 1:00 p.m. on Tuesday, June 23 2011.  Please arrive by 12:30)    Contact information:   [address] [place], [place]  [address]  [phone]      Follow up with Provider Not In System .         Follow-up recommendations:  Transferred to ED.   Signed: Griff Badley A 06/24/2011, 2:44 PM

## 2011-06-24 NOTE — ED Notes (Signed)
Pt instructed to place belongings into bags, dress into paper scrubs.

## 2011-06-25 ENCOUNTER — Emergency Department (HOSPITAL_COMMUNITY): Payer: PRIVATE HEALTH INSURANCE

## 2011-06-25 ENCOUNTER — Emergency Department (HOSPITAL_COMMUNITY)
Admission: EM | Admit: 2011-06-25 | Discharge: 2011-06-25 | Disposition: A | Discharge status: Home / Self Care | Payer: PRIVATE HEALTH INSURANCE | Attending: Emergency Medicine | Admitting: Emergency Medicine

## 2011-06-25 DIAGNOSIS — M545 Low back pain, unspecified: Secondary | ICD-10-CM | POA: Insufficient documentation

## 2011-06-25 DIAGNOSIS — Z79899 Other long term (current) drug therapy: Secondary | ICD-10-CM | POA: Insufficient documentation

## 2011-06-25 DIAGNOSIS — F3289 Other specified depressive episodes: Secondary | ICD-10-CM | POA: Insufficient documentation

## 2011-06-25 DIAGNOSIS — F329 Major depressive disorder, single episode, unspecified: Secondary | ICD-10-CM | POA: Insufficient documentation

## 2011-06-25 DIAGNOSIS — G8929 Other chronic pain: Secondary | ICD-10-CM | POA: Insufficient documentation

## 2011-06-25 LAB — URINALYSIS, ROUTINE W REFLEX MICROSCOPIC
Glucose, UA: NEGATIVE mg/dL
Hgb urine dipstick: NEGATIVE
Specific Gravity, Urine: 1.029 (ref 1.005–1.030)
Urobilinogen, UA: 0.2 mg/dL (ref 0.0–1.0)
pH: 5.5 (ref 5.0–8.0)

## 2011-06-25 LAB — ETHANOL: Alcohol, Ethyl (B): 85 mg/dL — ABNORMAL HIGH (ref 0–11)

## 2011-06-25 MED ORDER — HYDROCODONE-ACETAMINOPHEN 5-325 MG PO TABS
1.0000 | ORAL_TABLET | Freq: Once | ORAL | Status: AC
  Administered 2011-06-25: 1 via ORAL
  Filled 2011-06-25: qty 1

## 2011-06-25 MED ORDER — HYDROCODONE-ACETAMINOPHEN 5-325 MG PO TABS: 1.0000 | ORAL_TABLET | ORAL | Status: DC | PRN

## 2011-06-25 NOTE — ED Provider Notes (Signed)
Medical screening examination/treatment/procedure(s) were performed by non-physician practitioner and as supervising physician I was immediately available for consultation/collaboration.   Loren Racer, MD 06/25/11 [phone]

## 2011-06-25 NOTE — ED Notes (Signed)
Only one bag of personal belongings found with a jacket, shoes, wallet and 2 medication bottles. Pt states he has a white t-shirt, blue jeans, and a black and gold jacket with fur around the collar. Looked in all lockers and activity room, no other belongings found. Security currently looking for belongings.

## 2011-06-25 NOTE — ED Notes (Signed)
Accompanied lab in to draw blood. Pt cooperative.

## 2011-06-25 NOTE — Progress Notes (Signed)
This assessment/evaluation/note was completed by a Child psychotherapist of Social Work Warden/ranger and as Investment banker, corporate, I was immediately available for consultation/collaboration. I am in agreement with the contents and disposition reflected in the assessment/evaluation/note(s).   Ileene Hutchinson , MSW, LCSWA 06/25/2011 9:28 AM [phone]

## 2011-06-25 NOTE — ED Provider Notes (Signed)
History     CSN: [id number]  Arrival date & time 06/25/11  2042   First MD Initiated Contact with Patient 06/25/11 2226      Chief Complaint  Patient presents with  . Back Pain    (Consider location/radiation/quality/duration/timing/severity/associated sxs/prior treatment) HPI Comments: Patient was just discharged from the hospital today (here for psychiatric evaluation) states that he was not given all of his pain medication - states that he normally takes norco for his pain - states now with lower back pain - denies radiation of the pain - denies fever or chills, radiation of the pain, weakness, numbness, tingling, loss of control of bowels or bladder.  Patient is a 41 y.o. male presenting with back pain. The history is provided by the patient. No language interpreter was used.  Back Pain  This is a recurrent problem. The current episode started yesterday. The problem occurs constantly. The problem has not changed since onset.The pain is associated with no known injury. The pain is present in the lumbar spine. The quality of the pain is described as stabbing. The pain does not radiate. The pain is at a severity of 5/10. The pain is moderate. The symptoms are aggravated by bending and twisting. The pain is the same all the time. Stiffness is present all day. Pertinent negatives include no chest pain, no fever, no numbness, no weight loss, no headaches, no abdominal pain, no abdominal swelling, no bowel incontinence, no perianal numbness, no bladder incontinence, no dysuria, no pelvic pain, no leg pain, no paresthesias, no paresis, no tingling and no weakness. He has tried nothing for the symptoms. The treatment provided no relief.    Past Medical History  Diagnosis Date  . Alcohol abuse   . Anemia   . Severe major depression with psychotic features     No past surgical history on file.  No family history on file.  History  Substance Use Topics  . Smoking status: Current Everyday  Smoker -- 0.5 packs/day  . Smokeless tobacco: Not on file  . Alcohol Use: 50.4 oz/week    84 Cans of beer per week     heavy       Review of Systems  Constitutional: Negative for fever and weight loss.  Cardiovascular: Negative for chest pain.  Gastrointestinal: Negative for abdominal pain and bowel incontinence.  Genitourinary: Negative for bladder incontinence, dysuria and pelvic pain.  Musculoskeletal: Positive for back pain.  Neurological: Negative for tingling, weakness, numbness, headaches and paresthesias.  All other systems reviewed and are negative.    Allergies  Sertraline  Home Medications   Current Outpatient Rx  Name Route Sig Dispense Refill  . ACETAMINOPHEN 325 MG PO TABS Oral Take 2 tablets (650 mg total) by mouth every 6 (six) hours as needed.    Marland Kitchen BENZTROPINE MESYLATE 0.5 MG PO TABS Oral Take 1 tablet (0.5 mg total) by mouth 2 (two) times daily. 14 tablet 0    You will need to refill this at [facility].  Marland Kitchen HYDROCODONE-ACETAMINOPHEN 5-325 MG PO TABS Oral Take 1-2 tablets by mouth every 6 (six) hours as needed for pain. 30 tablet 0    You can use what you have at home.  Refills will n ...  . ADULT MULTIVITAMIN W/MINERALS CH Oral Take 1 tablet by mouth daily.    Marland Kitchen RISPERIDONE 3 MG PO TABS Oral Take 3 mg by mouth at bedtime.    . TRAZODONE HCL 100 MG PO TABS Oral Take 100 mg by mouth  at bedtime.      BP 122/85  Pulse 86  Temp(Src) 98.7 F (37.1 C) (Oral)  Resp 18  SpO2 98%  Physical Exam  Nursing note and vitals reviewed. Constitutional: He is oriented to person, place, and time. He appears well-developed and well-nourished. No distress.  HENT:  Head: Normocephalic and atraumatic.  Right Ear: External ear normal.  Left Ear: External ear normal.  Nose: Nose normal.  Mouth/Throat: Oropharynx is clear and moist. No oropharyngeal exudate.  Eyes: Conjunctivae are normal. Pupils are equal, round, and reactive to light. No scleral icterus.  Neck: Normal range  of motion. Neck supple.  Cardiovascular: Normal rate, regular rhythm and normal heart sounds.  Exam reveals no gallop and no friction rub.   No murmur heard. Pulmonary/Chest: Effort normal and breath sounds normal. He exhibits no tenderness.  Abdominal: Soft. Bowel sounds are normal. He exhibits no distension. There is no tenderness.  Musculoskeletal:       Lumbar back: He exhibits tenderness. He exhibits normal range of motion, no bony tenderness, no swelling and no deformity.  Lymphadenopathy:    He has no cervical adenopathy.  Neurological: He is alert and oriented to person, place, and time. He has normal reflexes. No cranial nerve deficit. He exhibits normal muscle tone. Coordination normal.  Skin: Skin is warm and dry. No rash noted. No erythema. No pallor.  Psychiatric: He has a normal mood and affect. His behavior is normal. Judgment and thought content normal.    ED Course  Procedures (including critical care time)   Labs Reviewed  URINALYSIS, ROUTINE W REFLEX MICROSCOPIC   Dg Ribs Unilateral W/chest Left  06/25/2011  *RADIOLOGY REPORT*  Clinical Data: Left rib pain.  No known injury.  LEFT RIBS AND CHEST - 3+ VIEW  Comparison: 06/08/2011.  Findings: Normal sized heart.  Clear lungs.  Interval small amount of callus bridging the previously demonstrated left lateral ninth rib fracture.  No additional fractures seen.  IMPRESSION: Healing left lateral ninth rib fracture.  Original Report Authenticated By: Darrol Angel, M.D.     Chronic lower back pain    MDM  Patient here with chronic lower back pain - will rx short course pain medication and discharge to home.        Izola Price [place], [place] 06/25/11 2340

## 2011-06-25 NOTE — ED Notes (Signed)
telepsych was completed

## 2011-06-25 NOTE — Discharge Instructions (Signed)
Back Pain, Adult Low back pain is very common. About 1 in 5 people have back pain.The cause of low back pain is rarely dangerous. The pain often gets better over time.About half of people with a sudden onset of back pain feel better in just 2 weeks. About 8 in 10 people feel better by 6 weeks.  CAUSES Some common causes of back pain include:  Strain of the muscles or ligaments supporting the spine.   Wear and tear (degeneration) of the spinal discs.   Arthritis.   Direct injury to the back.  DIAGNOSIS Most of the time, the direct cause of low back pain is not known.However, back pain can be treated effectively even when the exact cause of the pain is unknown.Answering your caregiver's questions about your overall health and symptoms is one of the most accurate ways to make sure the cause of your pain is not dangerous. If your caregiver needs more information, he or she may order lab work or imaging tests (X-rays or MRIs).However, even if imaging tests show changes in your back, this usually does not require surgery. HOME CARE INSTRUCTIONS For many people, back pain returns.Since low back pain is rarely dangerous, it is often a condition that people can learn to manageon their own.   Remain active. It is stressful on the back to sit or stand in one place. Do not sit, drive, or stand in one place for more than 30 minutes at a time. Take short walks on level surfaces as soon as pain allows.Try to increase the length of time you walk each day.   Do not stay in bed.Resting more than 1 or 2 days can delay your recovery.   Do not avoid exercise or work.Your body is made to move.It is not dangerous to be active, even though your back may hurt.Your back will likely heal faster if you return to being active before your pain is gone.   Pay attention to your body when you bend and lift. Many people have less discomfortwhen lifting if they bend their knees, keep the load close to their  bodies,and avoid twisting. Often, the most comfortable positions are those that put less stress on your recovering back.   Find a comfortable position to sleep. Use a firm mattress and lie on your side with your knees slightly bent. If you lie on your back, put a pillow under your knees.   Only take over-the-counter or prescription medicines as directed by your caregiver. Over-the-counter medicines to reduce pain and inflammation are often the most helpful.Your caregiver may prescribe muscle relaxant drugs.These medicines help dull your pain so you can more quickly return to your normal activities and healthy exercise.   Put ice on the injured area.   Put ice in a plastic bag.   Place a towel between your skin and the bag.   Leave the ice on for 15 to 20 minutes, 3 to 4 times a day for the first 2 to 3 days. After that, ice and heat may be alternated to reduce pain and spasms.   Ask your caregiver about trying back exercises and gentle massage. This may be of some benefit.   Avoid feeling anxious or stressed.Stress increases muscle tension and can worsen back pain.It is important to recognize when you are anxious or stressed and learn ways to manage it.Exercise is a great option.  SEEK MEDICAL CARE IF:  You have pain that is not relieved with rest or medicine.   You have   pain that does not improve in 1 week.   You have new symptoms.   You are generally not feeling well.  SEEK IMMEDIATE MEDICAL CARE IF:   You have pain that radiates from your back into your legs.   You develop new bowel or bladder control problems.   You have unusual weakness or numbness in your arms or legs.   You develop nausea or vomiting.   You develop abdominal pain.   You feel faint.  Document Released: 04/20/2005 Document Revised: 12/31/2010 Document Reviewed: 09/08/2010 Exit[employer] Patient Information 2012 [employer]. 

## 2011-06-25 NOTE — ED Notes (Signed)
Pt is discharging home per telepsych and EDP orders. Pt has been given outpt referrals. Pt is eating lunch at this time and will be discharged after he finishes the meal.

## 2011-06-25 NOTE — ED Provider Notes (Addendum)
Pt seen and assessed. "I feel bad. I'm just tired of all of this mess." No new complaints otherwise. Evaluated by ACT.   Raeford Razor, MD 06/25/11 1610  Pt was evaluated by telepsych. Do not feel that he is in need of inpatient placement or involuntary commit. Resources provided. Outpt fu.  Raeford Razor, MD 06/25/11 1341

## 2011-06-25 NOTE — ED Notes (Signed)
Pt walked out by [employer]. NAD.

## 2011-06-25 NOTE — BH Assessment (Signed)
Assessment Note   [name] is an 41 y.o. male who presents to the ED voluntarily, endorsing SI with no plan. When asked if patient had thoughts of hurting himself, patient replied "i'm not having thoughts, I'll kill myself as soon as I get a chance." Pt further reports "I don't need a plan, I'll do something quick." Pt denies any HI. Pt reports hearing voices that tell him to kill himself. He also reports visual hallucinations, stating he sees little green men. Pt states he also "sees myself doing what I want to do."Patient states he is not sleeping, but that he is never able to sleep. Patient reports no change in appetite. Patient reports he has been hospitalized several times for SI. He reports no prior suicide attempts. Patient reports he is prescribed Risperdal bu has been non-compliant due to him not being able to find his prescription. Patient endorses alcohol abuse, stating he drinks "as much as I can, everyday."      Patient appeared disheveled and irritable but was cooperative with assessment. Pt is seeking inpatient treatment for SI and SA.   Axis I: Alcohol Abuse and Major Depression, Recurrent severe Axis II: Deferred Axis III:  Past Medical History  Diagnosis Date  . Alcohol abuse   . Anemia   . Severe major depression with psychotic features    Axis IV: economic problems, housing problems, other psychosocial or environmental problems, problems related to social environment, problems with access to health care services and problems with primary support group Axis V: 11-20 some danger of hurting self or others possible OR occasionally fails to maintain minimal personal hygiene OR gross impairment in communication  Past Medical History:  Past Medical History  Diagnosis Date  . Alcohol abuse   . Anemia   . Severe major depression with psychotic features     History reviewed. No pertinent past surgical history.  Family History: No family history on file.  Social  History:  reports that he has been smoking.  He does not have any smokeless tobacco history on file. He reports that he drinks about 50.4 ounces of alcohol per week. He reports that he uses illicit drugs (Marijuana).  Additional Social History:  Alcohol / Drug Use History of alcohol / drug use?: Yes Substance #1 Name of Substance 1: alcohol 1 - Age of First Use: 9 1 - Amount (size/oz): states as much as I can get 1 - Frequency: daily 1 - Duration: reports several years 1 - Last Use / Amount: states last night. Allergies:  Allergies  Allergen Reactions  . Sertraline Other (See Comments)    Acute GI distress with diarrhea and pain.     Home Medications:  Medications Prior to Admission  Medication Dose Route Frequency Provider Last Rate Last Dose  . alum & mag hydroxide-simeth (MAALOX/MYLANTA) 200-200-20 MG/5ML suspension 30 mL  30 mL Oral PRN Angus Seller, PA      . ibuprofen (ADVIL,MOTRIN) tablet 600 mg  600 mg Oral Q8H PRN Angus Seller, PA      . LORazepam (ATIVAN) tablet 1 mg  1 mg Oral Q8H PRN Angus Seller, PA      . nicotine (NICODERM CQ - dosed in mg/24 hours) patch 21 mg  21 mg Transdermal Daily Phill Mutter Dammen, PA      . ondansetron [facility]) tablet 4 mg  4 mg Oral Q8H PRN Angus Seller, PA      . risperiDONE (RISPERDAL) tablet 2 mg  2 mg  Oral Daily Phill Mutter Dammen, PA      . zolpidem (AMBIEN) tablet 5 mg  5 mg Oral QHS PRN Angus Seller, PA       Medications Prior to Admission  Medication Sig Dispense Refill  . acetaminophen (TYLENOL) 325 MG tablet Take 2 tablets (650 mg total) by mouth every 6 (six) hours as needed.      . benztropine (COGENTIN) 0.5 MG tablet Take 1 tablet (0.5 mg total) by mouth 2 (two) times daily.  14 tablet  0  . HYDROcodone-acetaminophen (NORCO) 5-325 MG per tablet Take 1-2 tablets by mouth every 6 (six) hours as needed for pain.  30 tablet  0  . Multiple Vitamin (MULITIVITAMIN WITH MINERALS) TABS Take 1 tablet by mouth daily.      . risperiDONE  (RISPERDAL) 3 MG tablet Take 3 mg by mouth at bedtime.      . traZODone (DESYREL) 100 MG tablet Take 100 mg by mouth at bedtime.        OB/GYN Status:  No LMP for male patient.  General Assessment Data Location of Assessment: [facility] ED ACT Assessment: Yes Living Arrangements: Homeless Can pt return to current living arrangement?: Yes Admission Status: Voluntary Is patient capable of signing voluntary admission?: Yes Transfer from: Acute Hospital Referral Source: Self/Family/Friend  Education Status Is patient currently in school?: No  Risk to self Suicidal Ideation: Yes-Currently Present Suicidal Intent: Yes-Currently Present Is patient at risk for suicide?: Yes Suicidal Plan?: No Access to Means: No What has been your use of drugs/alcohol within the last 12 months?: alcohol Previous Attempts/Gestures: No How many times?: 0  Other Self Harm Risks: none Triggers for Past Attempts: Unpredictable;Other (Comment) Intentional Self Injurious Behavior: None Family Suicide History: No Recent stressful life event(s):  (no support) Persecutory voices/beliefs?: No Depression: Yes Depression Symptoms: Feeling angry/irritable;Feeling worthless/self pity;Fatigue Substance abuse history and/or treatment for substance abuse?: No Suicide prevention information given to non-admitted patients: Not applicable  Risk to Others Homicidal Ideation: No Thoughts of Harm to Others: No Current Homicidal Intent: No Current Homicidal Plan: No Access to Homicidal Means: No Identified Victim: none History of harm to others?: No Assessment of Violence: None Noted Violent Behavior Description: none Does patient have access to weapons?: No Criminal Charges Pending?: No Does patient have a court date: No  Psychosis Hallucinations: Visual;Auditory Delusions: None noted  Mental Status Report Appear/Hygiene: Disheveled;Body odor Eye Contact: Poor Motor Activity: Unremarkable Speech:  Logical/coherent Level of Consciousness: Alert Mood: Depressed;Irritable Affect: Irritable;Depressed Anxiety Level: None Thought Processes: Coherent Judgement: Impaired Orientation: Person;Place;Time;Situation Obsessive Compulsive Thoughts/Behaviors: None  Cognitive Functioning Concentration: Normal Memory: Recent Intact;Remote Intact IQ: Average Insight: Poor Impulse Control: Poor Appetite: Fair Weight Loss: 0  Weight Gain: 0  Sleep: No Change Vegetative Symptoms: None  Prior Inpatient Therapy Prior Inpatient Therapy: Yes Prior Therapy Dates: 2012, 2011, 2013 Prior Therapy Facilty/Provider(s): facility in [place]; [facility], and [facility] Reason for Treatment: detox and SI  Prior Outpatient Therapy Prior Outpatient Therapy: No Prior Therapy Dates: NA Prior Therapy Facilty/Provider(s): NA Reason for Treatment: NA  ADL Screening (condition at time of admission) Patient's cognitive ability adequate to safely complete daily activities?: Yes Patient able to express need for assistance with ADLs?: Yes Independently performs ADLs?: Yes       Abuse/Neglect Assessment (Assessment to be complete while patient is alone) Physical Abuse: Denies Verbal Abuse: Denies Sexual Abuse: Denies Exploitation of patient/patient's resources: Denies Self-Neglect: Denies Values / Beliefs Cultural Requests During Hospitalization: None Spiritual Requests  During Hospitalization: None   Advance Directives (For Healthcare) Advance Directive: Patient does not have advance directive Nutrition Screen Diet: Regular Unintentional weight loss greater than 10lbs within the last month: No Dysphagia: No Home Tube Feeding or Total Parenteral Nutrition (TPN): No Patient appears severely malnourished: No Pregnant or Lactating: No  Additional Information 1:1 In Past 12 Months?: No CIRT Risk: No Elopement Risk: No Does patient have medical clearance?: Yes     Disposition:   Disposition Disposition of Patient: Inpatient treatment program;Referred to Type of inpatient treatment program: Adult Patient is being [facility] to [facility] for inpatient treatment.  On Site Evaluation by:   Reviewed with Physician:     Georgina Quint A 06/25/2011 1:34 AM

## 2011-06-25 NOTE — ED Notes (Signed)
bag of belongings found at triage by charge rn diane so 2 pt belonging bags returned to pt, he has all his belongings. Pt told to walk to nurses station when dressed. Bus pass given.

## 2011-06-25 NOTE — ED Notes (Signed)
Pt has stabbing back pain all over that started 4 hours ago without trauma. HX of back pain. Pt has hydrocodone PRN but states that he was here yesterday and they took it and didn't give it back, along with his other meds.

## 2011-06-25 NOTE — Progress Notes (Signed)
Met with patient this a.m.  Patient lying in bed- asking that light stay off as it "hurts his eyes". Patient alert, blunted with little verbal response. Patient denies any "current" suicidal ideations at this time or that the green man is present. Patient feels that he needs to be in a "program" where he can stay for awhile to help him to stop drinking. Patient relates he has been staying with different friends and has an unstable home plan. He refuses shelter because he states he "has a drinking problem." Discussed referral to [facility]; telepsych will be requested to assess patient. Bradly Bienenstock, SW Intern 06/25/2011 9:27 AM

## 2011-06-25 NOTE — Discharge Instructions (Signed)
Stress Management Stress is a state of physical or mental tension that often results from changes in your life or normal routine. Some common causes of stress are:  Death of a loved one.   Injuries or severe illnesses.   Getting fired or changing jobs.   Moving into a new home.  Other causes may be:  Sexual problems.   Business or financial losses.   Taking on a large debt.   Regular conflict with someone at home or at work.   Constant tiredness from lack of sleep.  It is not just bad things that are stressful. It may be stressful to:  Win the lottery.   Get married.   Buy a new car.  The amount of stress that can be easily tolerated varies from person to person. Changes generally cause stress, regardless of the types of change. Too much stress can affect your health. It may lead to physical or emotional problems. Too little stress (boredom) may also become stressful. SUGGESTIONS TO REDUCE STRESS:  Talk things over with your family and friends. It often is helpful to share your concerns and worries. If you feel your problem is serious, you may want to get help from a professional counselor.   Consider your problems one at a time instead of lumping them all together. Trying to take care of everything at once may seem impossible. List all the things you need to do and then start with the most important one. Set a goal to accomplish 2 or 3 things each day. If you expect to do too many in a single day you will naturally fail, causing you to feel even more stressed.   Do not use alcohol or drugs to relieve stress. Although you may feel better for a short time, they do not remove the problems that caused the stress. They can also be habit forming.   Exercise regularly - at least 3 times per week. Physical exercise can help to relieve that "uptight" feeling and will relax you.   The shortest distance between despair and hope is often a good night's sleep.   Go to bed and get up on  time allowing yourself time for appointments without being rushed.   Take a short "time-out" period from any stressful situation that occurs during the day. Close your eyes and take some deep breaths. Starting with the muscles in your face, tense them, hold it for a few seconds, then relax. Repeat this with the muscles in your neck, shoulders, hand, stomach, back and legs.   Take good care of yourself. Eat a balanced diet and get plenty of rest.   Schedule time for having fun. Take a break from your daily routine to relax.  HOME CARE INSTRUCTIONS   Call if you feel overwhelmed by your problems and feel you can no longer manage them on your own.   Return immediately if you feel like hurting yourself or someone else.  Document Released: 10/14/2000 Document Revised: 12/31/2010 Document Reviewed: 06/06/2007 [facility] Patient Information 2012 [place], [name].Depression  Depression is a strong emotion of feeling unhappy that can last for weeks, months, or even longer. Depression causes problems with the ability to function in life. It upsets your:   Relationships.   Sleep.   Eating habits.   Work habits.  HOME CARE  Take all medicine as told by your doctor.   Talk with a therapist, counselor, or friend.   Eat a healthy diet.   Exercise regularly.   Do  not drink alcohol or use drugs.  GET HELP RIGHT AWAY IF: You start to have thoughts about hurting yourself or others. MAKE SURE YOU:  Understand these instructions.   Will watch your condition.   Will get help right away if you are not doing well or get worse.  Document Released: 05/23/2010 Document Revised: 12/31/2010 Document Reviewed: 05/23/2010 [facility] Patient Information 2012 [place], [name].Suicidal Feelings, How to Help Yourself Everyone feels sad or unhappy at times, but depressing thoughts and feelings of hopelessness can lead to thoughts of suicide. It can seem as if life is too tough to handle. It is as if the mountain  is just too high and your climbing skills are not great enough. At that moment these dark thoughts and feelings may seem overwhelming and never ending. It is important to remember these feelings are temporary! They will go away. If you feel as though you have reached the point where suicide is the only answer, it is time to let someone know immediately. This is the first step to feeling better. The following steps will move you to safer ground and lead you in a positive direction out of depression. HOW TO COPE AND PREVENT SUICIDE  Let family, friends, teachers and/or counselors know. Get help. Try not to isolate yourself from those who care about you. Even though you may not feel sociable or think that you are not good company, talk with someone everyday. It is best if it is face to face. Remember, they will want to help you.   Eat a regularly spaced and well-balanced diet, and get plenty of rest.   Avoid alcohol and drugs because they will only make you feel worse and may also lower your inhibitions. Remove them from the home. If you are thinking of taking an overdose of your prescribed medications, give your medicines to someone who can give them to you one day at a time. If you are on antidepressants, let your caregiver know of your feelings so he or she can provide a safer medication, if that is a concern.   Remove weapons or poisons from your home.   Try to stick to routines. That may mean just walking the dog or feeding the cat. Follow a schedule and remind yourself that you have to keep that schedule every day. Play with your pets. If it is possible, and you do not have a pet, get one. They give you a sense of well-being, lower your blood pressure and make your heart feel good. They need you, and we all want to be needed.   Set some realistic goals and achieve them. Make a list and cross things off as you go. Accomplishments give a sense of worth. Wait until you are feeling better before doing  things you find difficult or unpleasant to do.   If you are able, try to start exercising. Even half-hour periods of exercise each day will make you feel better. Getting out in the sun or into nature helps you recover from depression faster. If you have a favorite place to walk, take advantage of that.   Increase safe activities that have always given you pleasure. This may include playing your favorite music, reading a good book, painting a picture or playing your favorite instrument. Do whatever takes your mind off your depression and puts a smile on your face.   Keep your living space well lit with windows open, and let the sun shine in. Bright light definitely treats depression, not just  people with the seasonal affective disorders (SAD).  Above all else remember, depression is temporary. It will go away. Do not contemplate suicide. Death as a permanent solution is not the answer. Suicide will take away the beautiful rest of life, and do lifelong harm to those around you who love you. Help is available. National Suicide Help Lines with 24 hour help are: 1-800-SUICIDE 4146559189 Document Released: 10/25/2002 Document Revised: 12/31/2010 Document Reviewed: 03/15/2007 [facility] Patient Information 2012 [place], [name].  RESOURCE GUIDE  Dental Problems  Patients with Medicaid: [facility] [phone] [address]                                           [phone] [address] Phone:  [phone]                                                  Phone:  [phone]  If unable to pay or uninsured, contact:  [facility] or [facility]. to become qualified for the adult dental clinic.  Chronic Pain Problems Contact [facility]  [phone] Patients need to be referred by their primary care doctor.  Insufficient Money for Medicine Contact United Way:  call "211" or [facility] [phone].  No Primary Care  Doctor Call [facility]  7054977518 Other agencies that provide inexpensive medical care    [facility]  [phone]    [facility]  [phone]    [facility]  [phone]    [facility]  [phone]    [facility]  [phone]    [facility]  [phone]  Psychological Services [facility]  [phone] [facility] Services  [phone] [facility]   [phone] (emergency services [phone])  Substance Abuse Resources Alcohol and Drug Services  [phone] [facility] [phone] [place] [phone] Floydene Flock [phone] Residential & Outpatient Substance Abuse Program  [phone]  Abuse/Neglect [facility] Child Abuse Hotline 941-321-0895 [facility] Child Abuse Hotline 403-853-2280 (After Hours)  Emergency Shelter [facility] (726)147-2162  Maternity Homes [facility] at [facility] [place] [facility] [place] (606)[phone] [facility] [phone]  MRSA Hotline #:   307 253 5356    [facility] Resources  [facility] [place]     United [employer]                          [facility] [address] [place]                       [address]      [address]  Patrecia Pace  [facility] Phone:  [phone]                                   Phone:  [phone]                 Phone:  [place] Phone:  [place] [phone] (After Hours)

## 2011-06-26 ENCOUNTER — Encounter (HOSPITAL_COMMUNITY): Payer: Self-pay | Admitting: Emergency Medicine

## 2011-06-26 ENCOUNTER — Emergency Department (HOSPITAL_COMMUNITY)
Admission: EM | Admit: 2011-06-26 | Discharge: 2011-06-29 | Disposition: A | Discharge status: Home / Self Care | Payer: Self-pay | Attending: Emergency Medicine | Admitting: Emergency Medicine

## 2011-06-26 DIAGNOSIS — F1020 Alcohol dependence, uncomplicated: Secondary | ICD-10-CM | POA: Insufficient documentation

## 2011-06-26 DIAGNOSIS — F322 Major depressive disorder, single episode, severe without psychotic features: Secondary | ICD-10-CM | POA: Insufficient documentation

## 2011-06-26 DIAGNOSIS — F1920 Other psychoactive substance dependence, uncomplicated: Secondary | ICD-10-CM | POA: Insufficient documentation

## 2011-06-26 DIAGNOSIS — F17200 Nicotine dependence, unspecified, uncomplicated: Secondary | ICD-10-CM | POA: Insufficient documentation

## 2011-06-26 DIAGNOSIS — R079 Chest pain, unspecified: Secondary | ICD-10-CM | POA: Insufficient documentation

## 2011-06-26 DIAGNOSIS — Z79899 Other long term (current) drug therapy: Secondary | ICD-10-CM | POA: Insufficient documentation

## 2011-06-26 MED ORDER — LORAZEPAM 1 MG PO TABS
1.0000 mg | ORAL_TABLET | Freq: Once | ORAL | Status: AC
  Administered 2011-06-27: 1 mg via ORAL
  Filled 2011-06-26: qty 1

## 2011-06-26 NOTE — ED Provider Notes (Signed)
Medical screening examination/treatment/procedure(s) were performed by non-physician practitioner and as supervising physician I was immediately available for consultation/collaboration.  Gerhard Munch, MD 06/26/11 2021

## 2011-06-26 NOTE — ED Notes (Signed)
Patient here for detox from ETOH, last drink was almost 2 days ago.  Patient with the shakes and cold chills.

## 2011-06-27 LAB — CBC
Hemoglobin: 12.8 g/dL — ABNORMAL LOW (ref 13.0–17.0)
MCH: 32.2 pg (ref 26.0–34.0)
MCV: 95.5 fL (ref 78.0–100.0)
Platelets: 262 10*3/uL (ref 150–400)
RBC: 3.97 MIL/uL — ABNORMAL LOW (ref 4.22–5.81)
WBC: 7.3 10*3/uL (ref 4.0–10.5)

## 2011-06-27 LAB — COMPREHENSIVE METABOLIC PANEL
ALT: 12 U/L (ref 0–53)
Alkaline Phosphatase: 60 U/L (ref 39–117)
BUN: 13 mg/dL (ref 6–23)
CO2: 29 mEq/L (ref 19–32)
GFR calc Af Amer: >90 mL/min (ref >90)
GFR calc non Af Amer: >90 mL/min (ref >90)
Glucose, Bld: 97 mg/dL (ref 70–99)
Potassium: 4.1 mEq/L (ref 3.5–5.1)
Sodium: 138 mEq/L (ref 135–145)

## 2011-06-27 LAB — DIFFERENTIAL
Eosinophils Absolute: 0.3 10*3/uL (ref 0.0–0.7)
Eosinophils Relative: 4 % (ref 0–5)
Lymphocytes Relative: 33 % (ref 12–46)
Lymphs Abs: 2.4 10*3/uL (ref 0.7–4.0)
Monocytes Relative: 10 % (ref 3–12)

## 2011-06-27 LAB — RAPID URINE DRUG SCREEN, HOSP PERFORMED: Barbiturates: NOT DETECTED

## 2011-06-27 LAB — ETHANOL: Alcohol, Ethyl (B): <11 mg/dL (ref 0–11)

## 2011-06-27 MED ORDER — LORAZEPAM 1 MG PO TABS: 1.0000 mg | ORAL_TABLET | Freq: Three times a day (TID) | ORAL | Status: DC | PRN

## 2011-06-27 MED ORDER — ACETAMINOPHEN 325 MG PO TABS
650.0000 mg | ORAL_TABLET | ORAL | Status: DC | PRN
  Administered 2011-06-28: 650 mg via ORAL
  Filled 2011-06-27: qty 2

## 2011-06-27 MED ORDER — ONDANSETRON HCL 8 MG PO TABS: 4.0000 mg | ORAL_TABLET | Freq: Three times a day (TID) | ORAL | Status: DC | PRN

## 2011-06-27 MED ORDER — NICOTINE 21 MG/24HR TD PT24
21.0000 mg | MEDICATED_PATCH | Freq: Every day | TRANSDERMAL | Status: DC
  Filled 2011-06-27: qty 1

## 2011-06-27 MED ORDER — ALUM & MAG HYDROXIDE-SIMETH 200-200-20 MG/5ML PO SUSP: 30.0000 mL | ORAL | Status: DC | PRN

## 2011-06-27 NOTE — BHH Counselor (Signed)
ACT spoke with Social Worker, French Ana, in regards to pt needing his mental health medication (Risperdal and Trazodon) in order to attend treatment at [facility].  She informed him that she would pass the information to the Monday Social Worker in order to assist him with it.

## 2011-06-27 NOTE — ED Provider Notes (Signed)
History     CSN: [id number]  Arrival date & time 06/26/11  2310   First MD Initiated Contact with Patient 06/27/11 0037      Chief Complaint  Patient presents with  . Alcohol Problem    (Consider location/radiation/quality/duration/timing/severity/associated sxs/prior treatment) Patient is a 41 y.o. male presenting with alcohol problem. The history is provided by the patient. No language interpreter was used.  Alcohol Problem This is a chronic problem. The current episode started more than 1 week ago. The problem occurs constantly. The problem has not changed since onset.Pertinent negatives include no chest pain, no abdominal pain, no headaches and no shortness of breath. The symptoms are aggravated by nothing. The symptoms are relieved by nothing. He has tried nothing for the symptoms. The treatment provided no relief.  Drinks a 24-36 pack a day.  Is here for Detox.  Last drink was almost 2 days ago.  Denies SI or HI.  No AH or VH.    Past Medical History  Diagnosis Date  . Alcohol abuse   . Anemia   . Severe major depression with psychotic features     History reviewed. No pertinent past surgical history.  History reviewed. No pertinent family history.  History  Substance Use Topics  . Smoking status: Current Everyday Smoker -- 0.5 packs/day    Types: Cigarettes  . Smokeless tobacco: Not on file  . Alcohol Use: 50.4 oz/week    84 Cans of beer per week     heavy       Review of Systems  Constitutional: Negative.   HENT: Negative.   Eyes: Negative.   Respiratory: Negative for shortness of breath.   Cardiovascular: Negative for chest pain.  Gastrointestinal: Negative for abdominal pain.  Genitourinary: Negative.   Musculoskeletal: Negative.   Skin: Negative.   Neurological: Negative for headaches.  Hematological: Negative.   Psychiatric/Behavioral: Negative for confusion and self-injury. The patient is not nervous/anxious.     Allergies  Sertraline  Home  Medications   Current Outpatient Rx  Name Route Sig Dispense Refill  . HYDROCODONE-ACETAMINOPHEN 5-325 MG PO TABS Oral Take 1-2 tablets by mouth every 6 (six) hours as needed for pain. 30 tablet 0    You can use what you have at home.  Refills will n ...  . ADULT MULTIVITAMIN W/MINERALS CH Oral Take 1 tablet by mouth daily.    Marland Kitchen RISPERIDONE 3 MG PO TABS Oral Take 3 mg by mouth at bedtime.    . TRAZODONE HCL 100 MG PO TABS Oral Take 100 mg by mouth at bedtime.      BP 136/82  Pulse 94  Temp(Src) 99 F (37.2 C) (Oral)  Resp 16  SpO2 98%  Physical Exam  Constitutional: He is oriented to person, place, and time. He appears well-developed and well-nourished. No distress.  HENT:  Head: Normocephalic and atraumatic.  Eyes: Conjunctivae are normal. Pupils are equal, round, and reactive to light.  Neck: Normal range of motion. Neck supple.  Cardiovascular: Normal rate and regular rhythm.   Pulmonary/Chest: Effort normal and breath sounds normal. He has no wheezes. He has no rales.  Abdominal: Soft. Bowel sounds are normal. There is no tenderness. There is no rebound and no guarding.  Musculoskeletal: Normal range of motion. He exhibits no edema.  Neurological: He is alert and oriented to person, place, and time.  Skin: Skin is warm and dry.  Psychiatric: Thought content normal.    ED Course  Procedures (including critical care time)  Labs Reviewed  CBC  DIFFERENTIAL  COMPREHENSIVE METABOLIC PANEL  ETHANOL  URINE RAPID DRUG SCREEN (HOSP PERFORMED)   Dg Ribs Unilateral W/chest Left  06/25/2011  *RADIOLOGY REPORT*  Clinical Data: Left rib pain.  No known injury.  LEFT RIBS AND CHEST - 3+ VIEW  Comparison: 06/08/2011.  Findings: Normal sized heart.  Clear lungs.  Interval small amount of callus bridging the previously demonstrated left lateral ninth rib fracture.  No additional fractures seen.  IMPRESSION: Healing left lateral ninth rib fracture.  Original Report Authenticated By:  Darrol Angel, M.D.     No diagnosis found.    MDM  Case d/w [facility]        Jasmine Awe, MD 06/27/11 [phone]

## 2011-06-27 NOTE — ED Notes (Signed)
ACT team paged regarding placement for patient

## 2011-06-27 NOTE — ED Notes (Signed)
Pt calling for ride so he may go home and get clothes

## 2011-06-27 NOTE — ED Notes (Signed)
Pt accepted for treatment, not detox, pt to find a ride, discharge paperwork prepared

## 2011-06-27 NOTE — BH Assessment (Signed)
Assessment Note   [name] is an 41 y.o. male who came to [facility] seeking detox from alcohol.  PT reports he has been drinking 2-3 12packs of beer daily for the last 2-3 months.  He reports he is motivated for treatment and knows that he can be successful. He was treated for alcohol abuse and suicidal ideation in January, but presently denies any suicidal ideation or homicidal ideation.  Pt information faxed to [facility] for review.    Axis I: Alcohol Abuse Axis II: Deferred Axis III:  Past Medical History  Diagnosis Date  . Alcohol abuse   . Anemia   . Severe major depression with psychotic features    Axis IV: economic problems, other psychosocial or environmental problems and problems related to social environment Axis V: 41-50 serious symptoms  Past Medical History:  Past Medical History  Diagnosis Date  . Alcohol abuse   . Anemia   . Severe major depression with psychotic features     History reviewed. No pertinent past surgical history.  Family History: History reviewed. No pertinent family history.  Social History:  reports that he has been smoking Cigarettes.  He has been smoking about .5 packs per day. He does not have any smokeless tobacco history on file. He reports that he drinks about 50.4 ounces of alcohol per week. He reports that he uses illicit drugs (Marijuana).  Additional Social History:  Alcohol / Drug Use History of alcohol / drug use?: Yes Substance #2 Name of Substance 2: Beer 2 - Age of First Use: 12 2 - Amount (size/oz): 2-3 12 PACKS 2 - Frequency: DAILY 2 - Duration: 2-3 MOS 2 - Last Use / Amount: 2 DAYS AGO 120 oz Allergies:  Allergies  Allergen Reactions  . Sertraline Other (See Comments)    Acute GI distress with diarrhea and pain.     Home Medications:  Medications Prior to Admission  Medication Dose Route Frequency Provider Last Rate Last Dose  . acetaminophen (TYLENOL) tablet 650 mg  650 mg Oral Q4H PRN April K Palumbo-Rasch, MD        . alum & mag hydroxide-simeth (MAALOX/MYLANTA) 200-200-20 MG/5ML suspension 30 mL  30 mL Oral PRN April K Palumbo-Rasch, MD      . HYDROcodone-acetaminophen [facility]) 5-325 MG per tablet 1 tablet  1 tablet Oral Once [place]. [place], [place]   1 tablet at 06/25/11 2246  . LORazepam (ATIVAN) tablet 1 mg  1 mg Oral Once April K Palumbo-Rasch, MD   1 mg at 06/27/11 0001  . LORazepam (ATIVAN) tablet 1 mg  1 mg Oral Q8H PRN April K Palumbo-Rasch, MD      . nicotine (NICODERM CQ - dosed in mg/24 hours) patch 21 mg  21 mg Transdermal Daily April K Palumbo-Rasch, MD      . ondansetron [facility]) tablet 4 mg  4 mg Oral Q8H PRN April K Palumbo-Rasch, MD      . DISCONTD: alum & mag hydroxide-simeth (MAALOX/MYLANTA) 200-200-20 MG/5ML suspension 30 mL  30 mL Oral PRN Angus Seller, PA      . DISCONTD: ibuprofen (ADVIL,MOTRIN) tablet 600 mg  600 mg Oral Q8H PRN Angus Seller, PA      . DISCONTD: LORazepam (ATIVAN) tablet 1 mg  1 mg Oral Q8H PRN Angus Seller, PA      . DISCONTD: nicotine (NICODERM CQ - dosed in mg/24 hours) patch 21 mg  21 mg Transdermal Daily Angus Seller, PA      .  DISCONTD: ondansetron (ZOFRAN) tablet 4 mg  4 mg Oral Q8H PRN Angus Seller, PA      . DISCONTD: risperiDONE (RISPERDAL) tablet 2 mg  2 mg Oral Daily Angus Seller, PA   2 mg at 06/25/11 1018  . DISCONTD: zolpidem (AMBIEN) tablet 5 mg  5 mg Oral QHS PRN Angus Seller, PA       Medications Prior to Admission  Medication Sig Dispense Refill  . HYDROcodone-acetaminophen (NORCO) 5-325 MG per tablet Take 1-2 tablets by mouth every 6 (six) hours as needed for pain.  30 tablet  0  . Multiple Vitamin (MULITIVITAMIN WITH MINERALS) TABS Take 1 tablet by mouth daily.      . risperiDONE (RISPERDAL) 3 MG tablet Take 3 mg by mouth at bedtime.      . traZODone (DESYREL) 100 MG tablet Take 100 mg by mouth at bedtime.        OB/GYN Status:  No LMP for male patient.  General Assessment Data Location of Assessment: [facility] ED Living  Arrangements: Homeless (Staying with friends) Can pt return to current living arrangement?: Yes Admission Status: Voluntary Is patient capable of signing voluntary admission?: Yes Transfer from: Acute Hospital Referral Source: Self/Family/Friend  Education Status Is patient currently in school?: No Highest grade of school patient has completed: 11  Risk to self Suicidal Ideation: No-Not Currently/Within Last 6 Months Suicidal Intent: No-Not Currently/Within Last 6 Months Is patient at risk for suicide?: No Suicidal Plan?: No-Not Currently/Within Last 6 Months Access to Means: No What has been your use of drugs/alcohol within the last 12 months?: alcohol ongoing Previous Attempts/Gestures: No How many times?: 0  Other Self Harm Risks: n/a Triggers for Past Attempts: Unpredictable Intentional Self Injurious Behavior: None Family Suicide History: No Recent stressful life event(s): Other (Comment) (housing) Persecutory voices/beliefs?: No Depression: Yes Depression Symptoms: Despondent;Tearfulness;Isolating;Loss of interest in usual pleasures Substance abuse history and/or treatment for substance abuse?: Yes Suicide prevention information given to non-admitted patients: Not applicable  Risk to Others Homicidal Ideation: No Thoughts of Harm to Others: No Current Homicidal Intent: No Current Homicidal Plan: No Access to Homicidal Means: No Identified Victim: none History of harm to others?: No Assessment of Violence: None Noted Violent Behavior Description: n/a Does patient have access to weapons?: No Criminal Charges Pending?: No Does patient have a court date: No  Psychosis Hallucinations: None noted Delusions: None noted  Mental Status Report Appear/Hygiene: Other (Comment) (unremarkable) Eye Contact: Fair Motor Activity: Unremarkable Speech: Soft;Slow Level of Consciousness: Alert Mood: Depressed Affect: Appropriate to circumstance Anxiety Level:  Minimal Thought Processes: Coherent;Relevant Judgement: Impaired Orientation: Person;Place;Time;Situation Obsessive Compulsive Thoughts/Behaviors: None  Cognitive Functioning Concentration: Normal Memory: Recent Intact;Remote Intact IQ: Average Insight: Fair Impulse Control: Poor Appetite: Fair Weight Loss: 0  Weight Gain: 0  Sleep: No Change Vegetative Symptoms: None  Prior Inpatient Therapy Prior Inpatient Therapy: Yes Prior Therapy Dates: January 2013 Prior Therapy Facilty/Provider(s): Behavioral Health Reason for Treatment: Detox, SI  Prior Outpatient Therapy Prior Outpatient Therapy: Yes Prior Therapy Dates: current Prior Therapy Facilty/Provider(s): AA Reason for Treatment: SA  ADL Screening (condition at time of admission) Patient able to express need for assistance with ADLs?: Yes Independently performs ADLs?: Yes       Abuse/Neglect Assessment (Assessment to be complete while patient is alone) Physical Abuse: Denies Verbal Abuse: Denies Sexual Abuse: Denies Exploitation of patient/patient's resources: Denies Self-Neglect: Denies     Merchant navy officer (For Healthcare) Advance Directive: Patient does not have advance directive  Additional Information 1:1 In Past 12 Months?: No CIRT Risk: No Elopement Risk: No Does patient have medical clearance?: Yes     Disposition: Referred to [facility] for review    On Site Evaluation by:   Reviewed with Physician:     Steward Ros 06/27/2011 3:25 AM

## 2011-06-27 NOTE — ED Notes (Signed)
Page returned by Jerilynn Som, he will call [facility] and will relay the information back to this RN

## 2011-06-27 NOTE — ED Notes (Signed)
Change in plans, [facility] unable to take  Patient until Monday, Dr Patrica Duel notifed and pt undischarged

## 2011-06-27 NOTE — ED Notes (Signed)
Pt resting quietly, reports wanting help from detox. Pt states he drinks "12 pack a day" of beer with last drink "not even two days yet". Pt is calm and cooperative, denies any SI or HI at this time. Pt INAD, MAE x4, skin w/d and resp e/u. Will continue to monitor pt.

## 2011-06-27 NOTE — BHH Counselor (Signed)
Pt. Accepted to [facility], prescreen completed.  His admit date is 06/29/2011 and [facility] will transport him to the facility.

## 2011-06-27 NOTE — ED Notes (Signed)
Pt aware we requested his meal tray. Offer ativan as ordered but pt declined. Offered to arrange a shower, pt states will later tonight before he goes to bed. Denies needs or c/o.

## 2011-06-28 MED ORDER — ZOLPIDEM TARTRATE 5 MG PO TABS: 5.0000 mg | ORAL_TABLET | Freq: Every evening | ORAL | Status: DC | PRN

## 2011-06-28 NOTE — ED Provider Notes (Signed)
  Physical Exam  BP 123/79  Pulse 89  Temp(Src) 98.6 F (37 C) (Oral)  Resp 16  SpO2 99%  Physical Exam  ED Course  Procedures  MDM Discussed with ACT. Patient should be transferred to [facility] or [facility] tomorrow morning.      Juliet Rude. Rubin Payor, MD 06/28/11 1904

## 2011-06-28 NOTE — ED Notes (Signed)
According to ACT team pt to be held here till tomorrow and then go to [facility].

## 2011-06-28 NOTE — ED Notes (Signed)
Dinner tray ordered Regular non-sharp. 

## 2011-06-28 NOTE — ED Notes (Signed)
Pt to the shower with security and tech.

## 2011-06-28 NOTE — BH Assessment (Signed)
Assessment Note   [name] is an 41 y.o. male.  who came to [facility] seeking detox from alcohol. PT reports he has been drinking 2-3 12packs of beer daily for the last 2-3 months. He reports he is motivated for treatment and knows that he can be successful. He was treated for alcohol abuse and suicidal ideation in January, but presently denies any suicidal ideation or homicidal ideation. Pt was accepted at [facility] for placement but will not be transported until 06/29/11 am by [facility].  Axis I: Substance Abuse Axis II: Deferred Axis III:  Past Medical History  Diagnosis Date  . Alcohol abuse   . Anemia   . Severe major depression with psychotic features    Axis IV: housing problems Axis V: 31-40 impairment in reality testing  Past Medical History:  Past Medical History  Diagnosis Date  . Alcohol abuse   . Anemia   . Severe major depression with psychotic features     History reviewed. No pertinent past surgical history.  Family History: History reviewed. No pertinent family history.  Social History:  reports that he has been smoking Cigarettes.  He has been smoking about .5 packs per day. He does not have any smokeless tobacco history on file. He reports that he drinks about 50.4 ounces of alcohol per week. He reports that he uses illicit drugs (Marijuana).  Additional Social History:  Alcohol / Drug Use History of alcohol / drug use?: Yes Substance #2 Name of Substance 2: Beer 2 - Age of First Use: 12 2 - Amount (size/oz): 2-3 12 PACKS 2 - Frequency: DAILY 2 - Duration: 2-3 MOS 2 - Last Use / Amount: 2 DAYS AGO 120 oz Allergies:  Allergies  Allergen Reactions  . Sertraline Other (See Comments)    Acute GI distress with diarrhea and pain.     Home Medications:  Medications Prior to Admission  Medication Dose Route Frequency Provider Last Rate Last Dose  . acetaminophen (TYLENOL) tablet 650 mg  650 mg Oral Q4H PRN April K Palumbo-Rasch, MD   650 mg at 06/28/11 0250  .  alum & mag hydroxide-simeth (MAALOX/MYLANTA) 200-200-20 MG/5ML suspension 30 mL  30 mL Oral PRN April K Palumbo-Rasch, MD      . HYDROcodone-acetaminophen [facility]) 5-325 MG per tablet 1 tablet  1 tablet Oral Once [address] [place], [place]   1 tablet at 06/25/11 2246  . LORazepam (ATIVAN) tablet 1 mg  1 mg Oral Once April K Palumbo-Rasch, MD   1 mg at 06/27/11 0001  . LORazepam (ATIVAN) tablet 1 mg  1 mg Oral Q8H PRN April K Palumbo-Rasch, MD      . nicotine (NICODERM CQ - dosed in mg/24 hours) patch 21 mg  21 mg Transdermal Daily April K Palumbo-Rasch, MD      . ondansetron [facility]) tablet 4 mg  4 mg Oral Q8H PRN April K Palumbo-Rasch, MD      . zolpidem [facility]) tablet 5 mg  5 mg Oral QHS PRN Lyanne Co, MD      . DISCONTD: alum & mag hydroxide-simeth (MAALOX/MYLANTA) 200-200-20 MG/5ML suspension 30 mL  30 mL Oral PRN Angus Seller, PA      . DISCONTD: ibuprofen (ADVIL,MOTRIN) tablet 600 mg  600 mg Oral Q8H PRN Angus Seller, PA      . DISCONTD: LORazepam (ATIVAN) tablet 1 mg  1 mg Oral Q8H PRN Angus Seller, PA      . DISCONTD: nicotine (NICODERM CQ - dosed  in mg/24 hours) patch 21 mg  21 mg Transdermal Daily Angus Seller, PA      . DISCONTD: ondansetron [facility]) tablet 4 mg  4 mg Oral Q8H PRN Angus Seller, PA      . DISCONTD: risperiDONE (RISPERDAL) tablet 2 mg  2 mg Oral Daily Angus Seller, PA   2 mg at 06/25/11 1018  . DISCONTD: zolpidem (AMBIEN) tablet 5 mg  5 mg Oral QHS PRN Angus Seller, PA       Medications Prior to Admission  Medication Sig Dispense Refill  . HYDROcodone-acetaminophen (NORCO) 5-325 MG per tablet Take 1-2 tablets by mouth every 6 (six) hours as needed for pain.  30 tablet  0  . Multiple Vitamin (MULITIVITAMIN WITH MINERALS) TABS Take 1 tablet by mouth daily.      . risperiDONE (RISPERDAL) 3 MG tablet Take 3 mg by mouth at bedtime.      . traZODone (DESYREL) 100 MG tablet Take 100 mg by mouth at bedtime.        OB/GYN Status:  No LMP for male  patient.  General Assessment Data Location of Assessment: [facility] ED ACT Assessment: Yes Living Arrangements: Homeless Can pt return to current living arrangement?: Yes Admission Status: Voluntary Is patient capable of signing voluntary admission?: Yes Transfer from: Acute Hospital Referral Source: Self/Family/Friend  Education Status Is patient currently in school?: No Highest grade of school patient has completed: 11  Risk to self Suicidal Ideation: No-Not Currently/Within Last 6 Months Suicidal Intent: No-Not Currently/Within Last 6 Months Is patient at risk for suicide?: No Suicidal Plan?: No-Not Currently/Within Last 6 Months Access to Means: No What has been your use of drugs/alcohol within the last 12 months?: alcohol Previous Attempts/Gestures: No How many times?: 0  Other Self Harm Risks: n/a Triggers for Past Attempts: None known Intentional Self Injurious Behavior: None Family Suicide History: No Recent stressful life event(s): Other (Comment) (housing) Persecutory voices/beliefs?: No Depression: Yes Depression Symptoms: Guilt Substance abuse history and/or treatment for substance abuse?: Yes Suicide prevention information given to non-admitted patients: Not applicable  Risk to Others Homicidal Ideation: No-Not Currently/Within Last 6 Months Thoughts of Harm to Others: No Current Homicidal Intent: No Current Homicidal Plan: No Access to Homicidal Means: No Identified Victim: none History of harm to others?: No Assessment of Violence: None Noted Violent Behavior Description: n/a Does patient have access to weapons?: No Criminal Charges Pending?: No Does patient have a court date: No  Psychosis Hallucinations: None noted Delusions: None noted  Mental Status Report Appear/Hygiene: Other (Comment) (casual) Eye Contact: Good Motor Activity: Unremarkable Speech: Soft;Slow Level of Consciousness: Alert Mood: Other (Comment) (appropriate) Affect: Appropriate  to circumstance Anxiety Level: None Thought Processes: Coherent Judgement: Unimpaired Orientation: Person;Place;Time;Situation Obsessive Compulsive Thoughts/Behaviors: None  Cognitive Functioning Concentration: Normal Memory: Recent Intact;Remote Intact IQ: Average Insight: Poor Impulse Control: Poor Appetite: Fair Weight Loss: 0  Weight Gain: 0  Sleep: No Change Vegetative Symptoms: None  Prior Inpatient Therapy Prior Inpatient Therapy: Yes Prior Therapy Dates: January 2013 Prior Therapy Facilty/Provider(s): Behavioral Health Reason for Treatment: Detox, SI  Prior Outpatient Therapy Prior Outpatient Therapy: Yes Prior Therapy Dates: current Prior Therapy Facilty/Provider(s): AA Reason for Treatment: SA  ADL Screening (condition at time of admission) Patient able to express need for assistance with ADLs?: Yes Independently performs ADLs?: Yes       Abuse/Neglect Assessment (Assessment to be complete while patient is alone) Physical Abuse: Denies Verbal Abuse: Denies Sexual Abuse: Denies Exploitation of patient/patient's  resources: Denies Self-Neglect: Denies Values / Beliefs Cultural Requests During Hospitalization: None Spiritual Requests During Hospitalization: None   Advance Directives (For Healthcare) Advance Directive: Patient does not have advance directive    Additional Information 1:1 In Past 12 Months?: No CIRT Risk: No Elopement Risk: No Does patient have medical clearance?: Yes     Disposition:  Pt waiting to be transported to [facility] on 06/29/11.     On Site Evaluation by:   Reviewed with Physician:     Earmon Phoenix 06/28/2011 6:37 PM

## 2011-06-29 MED ORDER — RISPERIDONE 3 MG PO TABS: 3.0000 mg | ORAL_TABLET | Freq: Every day | ORAL | Status: DC

## 2011-06-29 MED ORDER — IBUPROFEN 200 MG PO TABS
400.0000 mg | ORAL_TABLET | Freq: Four times a day (QID) | ORAL | Status: DC | PRN
  Administered 2011-06-29: 400 mg via ORAL
  Filled 2011-06-29: qty 2

## 2011-06-29 MED ORDER — TRAZODONE HCL 100 MG PO TABS: 100.0000 mg | ORAL_TABLET | Freq: Every day | ORAL | Status: DC

## 2011-06-29 NOTE — ED Notes (Signed)
Pt c/o rib pain and lower back pain. Pt medicated with prn motrin order for pain relief.

## 2011-06-29 NOTE — ED Notes (Signed)
Pt was given a 14 day supply for ordered med. SW gave voucher for [facility]  Transport to [facility]. D/C  Instructions reviewed with PT . Pt was given the  14day supply of MED ordered.

## 2011-06-29 NOTE — BH Assessment (Signed)
Assessment Note   [name] is an 41 y.o. male that presented to [facility] requesting treatment for alcohol dependence (see previous note attached for more details as well as assessment).  Pt was reassessed this day.  Pt continues to be motivated for treatment.  Pt denies SI, HI or psychosis.  Called [facility], spoke with Toniann Fail to check on pt's departure time, as previous notes as well as previous clinician stated pt was accepted to [facility] and would be transported this AM.  However, Toniann Fail stated she knew nothing of the pt and would call writer back after bed meeting.  Toniann Fail called Clinical research associate back stating pt was accepted for treatment pending [facility] authorization.  Called [facility], spoke with Arline Asp and Dois Davenport @ 0930 and pt authorization [phone] for days 06/29/11-07/12/11 with review on 3/10.  Called Toniann Fail to give this information.  Pt also has to have 14 days meds.  Pt does not have these and this being worked out with Sports coach and [facility] notified.  Per Meade Maw to pick pt up for transport at 1300.  Completed reassessment, assessment notification and faxed to [facility] to log.  Updated EDP Zackowski and ED staff.  Axis I: Alcohol Dependence Axis II: Deferred Axis III:  Past Medical History  Diagnosis Date  . Alcohol abuse   . Anemia   . Severe major depression with psychotic features    Axis IV: economic problems, housing problems and problems with primary support group Axis V: 41-50 serious symptoms  Past Medical History:  Past Medical History  Diagnosis Date  . Alcohol abuse   . Anemia   . Severe major depression with psychotic features     History reviewed. No pertinent past surgical history.  Family History: History reviewed. No pertinent family history.  Social History:  reports that he has been smoking Cigarettes.  He has been smoking about .5 packs per day. He does not have any smokeless tobacco history on file. He reports that he drinks about 50.4 ounces of alcohol per week. He reports  that he uses illicit drugs (Marijuana).  Additional Social History:  Alcohol / Drug Use History of alcohol / drug use?: Yes Longest period of sobriety (when/how long): 26 months Negative Consequences of Use: Financial;Personal relationships;Work / Programmer, multimedia Withdrawal Symptoms: Tremors Substance #2 Name of Substance 2: Beer 2 - Age of First Use: 12 2 - Amount (size/oz): 2-3 12 PACKS 2 - Frequency: DAILY 2 - Duration: 2-3 MOS 2 - Last Use / Amount: 2 DAYS AGO 120 oz Allergies:  Allergies  Allergen Reactions  . Sertraline Other (See Comments)    Acute GI distress with diarrhea and pain.     Home Medications:  Medications Prior to Admission  Medication Dose Route Frequency Provider Last Rate Last Dose  . acetaminophen (TYLENOL) tablet 650 mg  650 mg Oral Q4H PRN April K Palumbo-Rasch, MD   650 mg at 06/28/11 0250  . alum & mag hydroxide-simeth (MAALOX/MYLANTA) 200-200-20 MG/5ML suspension 30 mL  30 mL Oral PRN April K Palumbo-Rasch, MD      . HYDROcodone-acetaminophen [facility]) 5-325 MG per tablet 1 tablet  1 tablet Oral Once [address] [place], [place]   1 tablet at 06/25/11 2246  . ibuprofen (ADVIL,MOTRIN) tablet 400 mg  400 mg Oral Q6H PRN Shelda Jakes, MD   400 mg at 06/29/11 0936  . LORazepam (ATIVAN) tablet 1 mg  1 mg Oral Once April K Palumbo-Rasch, MD   1 mg at 06/27/11 0001  . LORazepam (ATIVAN) tablet  1 mg  1 mg Oral Q8H PRN April K Palumbo-Rasch, MD      . nicotine (NICODERM CQ - dosed in mg/24 hours) patch 21 mg  21 mg Transdermal Daily April K Palumbo-Rasch, MD      . ondansetron [facility]) tablet 4 mg  4 mg Oral Q8H PRN April K Palumbo-Rasch, MD      . zolpidem [facility]) tablet 5 mg  5 mg Oral QHS PRN Lyanne Co, MD      . DISCONTD: alum & mag hydroxide-simeth (MAALOX/MYLANTA) 200-200-20 MG/5ML suspension 30 mL  30 mL Oral PRN Angus Seller, PA      . DISCONTD: ibuprofen (ADVIL,MOTRIN) tablet 600 mg  600 mg Oral Q8H PRN Angus Seller, PA      . DISCONTD: LORazepam  (ATIVAN) tablet 1 mg  1 mg Oral Q8H PRN Angus Seller, PA      . DISCONTD: nicotine (NICODERM CQ - dosed in mg/24 hours) patch 21 mg  21 mg Transdermal Daily Angus Seller, PA      . DISCONTD: ondansetron (ZOFRAN) tablet 4 mg  4 mg Oral Q8H PRN Angus Seller, PA      . DISCONTD: risperiDONE (RISPERDAL) tablet 2 mg  2 mg Oral Daily Angus Seller, PA   2 mg at 06/25/11 1018  . DISCONTD: zolpidem (AMBIEN) tablet 5 mg  5 mg Oral QHS PRN Angus Seller, PA       Medications Prior to Admission  Medication Sig Dispense Refill  . HYDROcodone-acetaminophen (NORCO) 5-325 MG per tablet Take 1-2 tablets by mouth every 6 (six) hours as needed for pain.  30 tablet  0  . Multiple Vitamin (MULITIVITAMIN WITH MINERALS) TABS Take 1 tablet by mouth daily.      . risperiDONE (RISPERDAL) 3 MG tablet Take 3 mg by mouth at bedtime.      . traZODone (DESYREL) 100 MG tablet Take 100 mg by mouth at bedtime.        OB/GYN Status:  No LMP for male patient.  General Assessment Data Location of Assessment: [facility] ED ACT Assessment: Yes Living Arrangements: Homeless Can pt return to current living arrangement?: Yes Admission Status: Voluntary Is patient capable of signing voluntary admission?: Yes Transfer from: Acute Hospital Referral Source: Self/Family/Friend  Education Status Is patient currently in school?: No Highest grade of school patient has completed: 11  Risk to self Suicidal Ideation: No-Not Currently/Within Last 6 Months Suicidal Intent: No-Not Currently/Within Last 6 Months Is patient at risk for suicide?: No Suicidal Plan?: No-Not Currently/Within Last 6 Months Access to Means: No Specify Access to Suicidal Means: n/a What has been your use of drugs/alcohol within the last 12 months?: Daily use of alcohol Previous Attempts/Gestures: No How many times?: 0  Other Self Harm Risks: n/a Triggers for Past Attempts: None known Intentional Self Injurious Behavior: None Family Suicide History:  No Recent stressful life event(s): Other (Comment) (homeless) Persecutory voices/beliefs?: No Depression: Yes Depression Symptoms: Despondent;Guilt;Loss of interest in usual pleasures Substance abuse history and/or treatment for substance abuse?: Yes Suicide prevention information given to non-admitted patients: Not applicable  Risk to Others Homicidal Ideation: No-Not Currently/Within Last 6 Months Thoughts of Harm to Others: No-Not Currently Present/Within Last 6 Months Current Homicidal Intent: No-Not Currently/Within Last 6 Months Current Homicidal Plan: No-Not Currently/Within Last 6 Months Access to Homicidal Means: No Identified Victim: n/a History of harm to others?: No Assessment of Violence: None Noted Violent Behavior Description: n/a Does patient have access  to weapons?: No Criminal Charges Pending?: No Does patient have a court date: No  Psychosis Hallucinations: None noted Delusions: None noted  Mental Status Report Appear/Hygiene: Other (Comment) (casual) Eye Contact: Good Motor Activity: Unremarkable Speech: Soft;Slow Level of Consciousness: Alert Mood: Other (Comment) (Appropriate) Affect: Appropriate to circumstance Anxiety Level: None Thought Processes: Coherent;Relevant Judgement: Unimpaired Orientation: Person;Place;Time;Situation Obsessive Compulsive Thoughts/Behaviors: None  Cognitive Functioning Concentration: Normal Memory: Recent Intact;Remote Intact IQ: Average Insight: Poor Impulse Control: Poor Appetite: Fair Weight Loss: 0  Weight Gain: 0  Sleep: No Change Total Hours of Sleep:  (varies) Vegetative Symptoms: None  Prior Inpatient Therapy Prior Inpatient Therapy: Yes Prior Therapy Dates: January 2013 Prior Therapy Facilty/Provider(s): Behavioral Health Reason for Treatment: Detox, SI  Prior Outpatient Therapy Prior Outpatient Therapy: Yes Prior Therapy Dates: current Prior Therapy Facilty/Provider(s): AA Reason for Treatment:  SA  ADL Screening (condition at time of admission) Patient able to express need for assistance with ADLs?: Yes Independently performs ADLs?: Yes       Abuse/Neglect Assessment (Assessment to be complete while patient is alone) Physical Abuse: Denies Verbal Abuse: Denies Sexual Abuse: Denies Exploitation of patient/patient's resources: Denies Self-Neglect: Denies Values / Beliefs Cultural Requests During Hospitalization: None Spiritual Requests During Hospitalization: None   Advance Directives (For Healthcare) Advance Directive: Patient does not have advance directive    Additional Information 1:1 In Past 12 Months?: No CIRT Risk: No Elopement Risk: No Does patient have medical clearance?: Yes     Disposition:  Disposition Disposition of Patient: Inpatient treatment program;Referred to Type of inpatient treatment program: Adult Other disposition(s): Referred to outside facility [facility]) Patient referred to: [facility] (Pt accepted [facility])  On Site Evaluation by:   Reviewed with Physician:  Delorse Limber, Rennis Harding 06/29/2011 11:22 AM

## 2011-06-29 NOTE — ED Notes (Signed)
Secretary to order lunch tray  

## 2011-06-29 NOTE — Progress Notes (Signed)
Rec'd call at 12:47pm that written prescriptions were ready for patient. I went and approved the scripts and tubed them to pharmacy, followed-by a confirmation phone call. Attending nurse will receive call when meds are ready for pick-up.

## 2011-06-29 NOTE — Discharge Instructions (Signed)
Finding Treatment for Alcohol and Drug Addiction It can be hard to find the right place to get professional treatment. Here are some important things to consider:  There are different types of treatment to choose from.   Some programs are live-in (residential) while others are not (outpatient). Sometimes a combination is offered.   No single type of program is right for everyone.   Most treatment programs involve a combination of education, counseling, and a 12-step, spiritually-based approach.   There are non-spiritually based programs (not 12-step).   Some treatment programs are government sponsored. They are geared for patients without private insurance.   Treatment programs can vary in many respects such as:   Cost and types of insurance accepted.   Types of on-site medical services offered.   Length of stay, setting, and size.   Overall philosophy of treatment.  A person may need specialized treatment or have needs not addressed by all programs. For example, adolescents need treatment appropriate for their age. Other people have secondary disorders that must be managed as well. Secondary conditions can include mental illness, such as depression or diabetes. Often, a period of detoxification from alcohol or drugs is needed. This requires medical supervision and not all programs offer this. THINGS TO CONSIDER WHEN SELECTING A TREATMENT PROGRAM   Is the program certified by the appropriate government agency? Even private programs must be certified and employ certified professionals.   Does the program accept your insurance? If not, can a payment plan be set up?   Is the facility clean, organized, and well run? Do they allow you to speak with graduates who can share their treatment experience with you? Can you tour the facility? Can you meet with staff?   Does the program meet the full range of individual needs?   Does the treatment program address sexual orientation and physical  disabilities? Do they provide age, gender, and culturally appropriate treatment services?   Is treatment available in languages other than English?   Is long-term aftercare support or guidance encouraged and provided?   Is assessment of an individual's treatment plan ongoing to ensure it meets changing needs?   Does the program use strategies to encourage reluctant patients to remain in treatment long enough to increase the likelihood of success?   Does the program offer counseling (individual or group) and other behavioral therapies?   Does the program offer medicine as part of the treatment regimen, if needed?   Is there ongoing monitoring of possible relapse? Is there a defined relapse prevention program? Are services or referrals offered to family members to ensure they understand addiction and the recovery process? This would help them support the recovering individual.   Are 12-step meetings held at the center or is transport available for patients to attend outside meetings?  In countries outside of the [place]. and [place], Magazine features editor for contact information for services in your area. Document Released: 03/19/2005 Document Revised: 12/31/2010 Document Reviewed: 09/29/2007 [facility] Patient Information 2012 [place], [name].   RESOURCE GUIDE  Dental Problems  Patients with Medicaid: [facility] [phone] [address]                                           [address]  [address] Phone:  [phone]                                                  Phone:  [phone]  If unable to pay or uninsured, contact:  [facility] or [facility]. to become qualified for the adult dental clinic.  Chronic Pain Problems Contact [facility]  [phone] Patients need to be referred by their primary care doctor.  Insufficient Money for Medicine Contact United Way:  call "211" or [facility]  (574) [phone].  No Primary Care Doctor Call [facility]  [phone] Other agencies that provide inexpensive medical care    [facility]  [phone]    [facility]  [phone]    [facility]  [phone]    [facility]  [phone]    [facility]  [phone]    [facility]  [phone]  Psychological Services [facility] Behavior[facility]  [phone] [facility]  [phone] [facility]   [phone] (emergency services [phone])  Substance Abuse Resources [facility]  [phone] [facility] [phone] [facility] [phone] Floydene Flock [phone] Residential & Outpatient Substance Abuse Program  [phone]  Abuse/Neglect [facility] Child Abuse Hotline [phone] [facility] Child Abuse Hotline [phone] (After Hours)  Emergency Shelter [facility] [phone]  Maternity Homes Room at the [place] [facility] [place] [phone] [facility] [phone]  MRSA Hotline #:   (772)034-6609    [facility] Resources  [facility] [place]     [employer]                          [facility] Dept. [address] [place]                       [address]      [address]  Blondell Reveal Phone:  [phone]                                   Phone:  [phone]                 Phone:  [phone]  [facility] Mental Health Phone:  [phone]  [facility] Child Abuse Hotline [phone] (After Hours)  The followup [facility] as accepted today.

## 2012-01-19 ENCOUNTER — Emergency Department (INDEPENDENT_AMBULATORY_CARE_PROVIDER_SITE_OTHER)
Admission: EM | Admit: 2012-01-19 | Discharge: 2012-01-19 | Disposition: A | Discharge status: Home / Self Care | Payer: Self-pay | Source: Home / Self Care | Attending: Emergency Medicine | Admitting: Emergency Medicine

## 2012-01-19 ENCOUNTER — Encounter (HOSPITAL_COMMUNITY): Payer: Self-pay | Admitting: *Deleted

## 2012-01-19 DIAGNOSIS — Z202 Contact with and (suspected) exposure to infections with a predominantly sexual mode of transmission: Secondary | ICD-10-CM

## 2012-01-19 DIAGNOSIS — M25569 Pain in unspecified knee: Secondary | ICD-10-CM

## 2012-01-19 DIAGNOSIS — Z2089 Contact with and (suspected) exposure to other communicable diseases: Secondary | ICD-10-CM

## 2012-01-19 MED ORDER — METRONIDAZOLE 500 MG PO TABS: 500.0000 mg | ORAL_TABLET | Freq: Two times a day (BID) | ORAL | Status: DC

## 2012-01-19 NOTE — ED Notes (Signed)
Pt reports possible exposure to trichomonas and would like HIV test. Pt also reports left knee swelling with no known injury.

## 2012-01-19 NOTE — ED Provider Notes (Signed)
History     CSN: [id number]  Arrival date & time 01/19/12  1232   First MD Initiated Contact with Patient 01/19/12 1234      Chief Complaint  Patient presents with  . Exposure to STD  . Knee Pain    (Consider location/radiation/quality/duration/timing/severity/associated sxs/prior treatment) HPI Comments: Patient presents urgent care complaining that in situ [facility] call him up to tell him that she has been diagnosed and treated for trichomonas. Patient denies having any urinary symptoms or penile discharge or discomfort. He was to be treated and also screen for the other STDs.  Patient also complains of having left knee pain and swelling for several weeks he describes that he has not fallen or had any recent injury that he can think of. Does hurt sometimes. Denies any constitutional symptoms such as fevers, chills, malaise, arthralgias myalgias or unintentional weight loss.  Patient is a 41 y.o. male presenting with STD exposure and knee pain. The history is provided by the patient.  Exposure to STD This is a new problem. Pertinent negatives include no chest pain, no abdominal pain, no headaches and no shortness of breath. He has tried nothing for the symptoms.  Knee Pain Pertinent negatives include no chest pain, no abdominal pain, no headaches and no shortness of breath.    Past Medical History  Diagnosis Date  . Alcohol abuse   . Anemia   . Severe major depression with psychotic features     History reviewed. No pertinent past surgical history.  Family History  Problem Relation Age of Onset  . Family history unknown: Yes    History  Substance Use Topics  . Smoking status: Current Every Day Smoker -- 0.5 packs/day    Types: Cigarettes  . Smokeless tobacco: Not on file  . Alcohol Use: 50.4 oz/week    84 Cans of beer per week     heavy       Review of Systems  Constitutional: Negative for chills, diaphoresis, activity change and appetite change.  Respiratory:  Negative for shortness of breath.   Cardiovascular: Negative for chest pain.  Gastrointestinal: Negative for abdominal pain.  Skin: Negative for color change and rash.  Neurological: Negative for headaches.  Psychiatric/Behavioral: Negative for suicidal ideas and disturbed wake/sleep cycle.    Allergies  Sertraline  Home Medications   Current Outpatient Rx  Name Route Sig Dispense Refill  . METRONIDAZOLE 500 MG PO TABS Oral Take 1 tablet (500 mg total) by mouth 2 (two) times daily. 14 tablet 0  . ADULT MULTIVITAMIN W/MINERALS CH Oral Take 1 tablet by mouth daily.    Marland Kitchen RISPERIDONE 3 MG PO TABS Oral Take 3 mg by mouth at bedtime.    Marland Kitchen RISPERIDONE 3 MG PO TABS Oral Take 1 tablet (3 mg total) by mouth daily. 14 tablet 0  . TRAZODONE HCL 100 MG PO TABS Oral Take 100 mg by mouth at bedtime.    . TRAZODONE HCL 100 MG PO TABS Oral Take 1 tablet (100 mg total) by mouth at bedtime. 14 tablet 0    BP 135/89  Pulse 76  Temp 98.5 F (36.9 C) (Oral)  Resp 19  SpO2 99%  Physical Exam  Nursing note and vitals reviewed. Constitutional: Vital signs are normal. He appears well-developed and well-nourished.  Non-toxic appearance. He does not have a sickly appearance. He does not appear ill. No distress.  HENT:  Head: Normocephalic.  Eyes: Conjunctivae normal are normal.  Neck: Neck supple.  Cardiovascular: Normal rate  and regular rhythm.   No murmur heard. Abdominal: He exhibits no distension. There is no tenderness. There is no rebound. Hernia confirmed negative in the right inguinal area and confirmed negative in the left inguinal area.  Genitourinary: Testes normal and penis normal.  Musculoskeletal: He exhibits no tenderness.  Skin: No rash noted. No erythema.    ED Course  Procedures (including critical care time)   Labs Reviewed  GC/CHLAMYDIA PROBE AMP, GENITAL  HIV ANTIBODY (ROUTINE TESTING)  RPR  HIV ANTIBODY (ROUTINE TESTING)  RPR   No results found.   1. Exposure to  sexually transmitted disease (STD)   2. Knee pain       MDM  Mr. Randa Ngo, presented to urgent care complaining of 2 distinctive complaints one in a chronic knee pain. No recent injury and also partner was recently diagnosed with trichomoniasis patient describes that he is asymptomatic. He also is requesting to be tested for other STDs. We have proceeded to do an HIV, RPR DNA probe for both Chlamydia and gonorrhea. This has been provided with prescription of Flagyl. Was also encouraged to take Motrin for his knee pain. His exam was not revealing are consistent with a fracture dislocation or a internal knee derangement.        Jimmie Molly, MD 01/19/12 [phone]

## 2012-01-20 LAB — HIV ANTIBODY (ROUTINE TESTING W REFLEX): HIV: NONREACTIVE

## 2012-01-20 LAB — RPR: RPR Ser Ql: NONREACTIVE

## 2012-07-06 ENCOUNTER — Emergency Department (HOSPITAL_COMMUNITY)
Admission: EM | Admit: 2012-07-06 | Discharge: 2012-07-06 | Disposition: A | Discharge status: Home / Self Care | Payer: Self-pay | Attending: Emergency Medicine | Admitting: Emergency Medicine

## 2012-07-06 ENCOUNTER — Emergency Department (HOSPITAL_COMMUNITY): Payer: Self-pay

## 2012-07-06 ENCOUNTER — Encounter (HOSPITAL_COMMUNITY): Payer: Self-pay | Admitting: Emergency Medicine

## 2012-07-06 DIAGNOSIS — K529 Noninfective gastroenteritis and colitis, unspecified: Secondary | ICD-10-CM

## 2012-07-06 DIAGNOSIS — Z862 Personal history of diseases of the blood and blood-forming organs and certain disorders involving the immune mechanism: Secondary | ICD-10-CM | POA: Insufficient documentation

## 2012-07-06 DIAGNOSIS — F323 Major depressive disorder, single episode, severe with psychotic features: Secondary | ICD-10-CM | POA: Insufficient documentation

## 2012-07-06 DIAGNOSIS — Z79899 Other long term (current) drug therapy: Secondary | ICD-10-CM | POA: Insufficient documentation

## 2012-07-06 DIAGNOSIS — F1010 Alcohol abuse, uncomplicated: Secondary | ICD-10-CM | POA: Insufficient documentation

## 2012-07-06 DIAGNOSIS — K5289 Other specified noninfective gastroenteritis and colitis: Secondary | ICD-10-CM | POA: Insufficient documentation

## 2012-07-06 DIAGNOSIS — F17200 Nicotine dependence, unspecified, uncomplicated: Secondary | ICD-10-CM | POA: Insufficient documentation

## 2012-07-06 LAB — CBC WITH DIFFERENTIAL/PLATELET
Basophils Relative: 0 % (ref 0–1)
HCT: 36.8 % — ABNORMAL LOW (ref 39.0–52.0)
Hemoglobin: 12.2 g/dL — ABNORMAL LOW (ref 13.0–17.0)
Lymphocytes Relative: 15 % (ref 12–46)
MCHC: 33.2 g/dL (ref 30.0–36.0)
Monocytes Absolute: 1.5 10*3/uL — ABNORMAL HIGH (ref 0.1–1.0)
Monocytes Relative: 12 % (ref 3–12)
Neutro Abs: 8.9 10*3/uL — ABNORMAL HIGH (ref 1.7–7.7)
Neutrophils Relative %: 72 % (ref 43–77)
RBC: 3.9 MIL/uL — ABNORMAL LOW (ref 4.22–5.81)
WBC: 12.4 10*3/uL — ABNORMAL HIGH (ref 4.0–10.5)

## 2012-07-06 LAB — COMPREHENSIVE METABOLIC PANEL
AST: 12 U/L (ref 0–37)
Albumin: 3.6 g/dL (ref 3.5–5.2)
Alkaline Phosphatase: 57 U/L (ref 39–117)
BUN: 9 mg/dL (ref 6–23)
CO2: 29 mEq/L (ref 19–32)
Chloride: 101 mEq/L (ref 96–112)
Creatinine, Ser: 0.9 mg/dL (ref 0.50–1.35)
GFR calc non Af Amer: >90 mL/min (ref >90)
Potassium: 3.7 mEq/L (ref 3.5–5.1)
Total Bilirubin: 0.4 mg/dL (ref 0.3–1.2)

## 2012-07-06 LAB — URINALYSIS, ROUTINE W REFLEX MICROSCOPIC
Bilirubin Urine: NEGATIVE
Glucose, UA: NEGATIVE mg/dL
Ketones, ur: NEGATIVE mg/dL
Protein, ur: NEGATIVE mg/dL
pH: 5 (ref 5.0–8.0)

## 2012-07-06 LAB — LIPASE, BLOOD: Lipase: 34 U/L (ref 11–59)

## 2012-07-06 MED ORDER — METRONIDAZOLE 500 MG PO TABS
500.0000 mg | ORAL_TABLET | Freq: Once | ORAL | Status: AC
  Administered 2012-07-06: 500 mg via ORAL
  Filled 2012-07-06: qty 1

## 2012-07-06 MED ORDER — SODIUM CHLORIDE 0.9 % IV BOLUS (SEPSIS)
1000.0000 mL | Freq: Once | INTRAVENOUS | Status: AC
  Administered 2012-07-06: 1000 mL via INTRAVENOUS

## 2012-07-06 MED ORDER — CIPROFLOXACIN HCL 500 MG PO TABS
500.0000 mg | ORAL_TABLET | Freq: Once | ORAL | Status: AC
  Administered 2012-07-06: 500 mg via ORAL
  Filled 2012-07-06: qty 1

## 2012-07-06 MED ORDER — HYDROMORPHONE HCL PF 1 MG/ML IJ SOLN
1.0000 mg | Freq: Once | INTRAMUSCULAR | Status: AC
  Administered 2012-07-06: 1 mg via INTRAVENOUS
  Filled 2012-07-06: qty 1

## 2012-07-06 MED ORDER — OXYCODONE-ACETAMINOPHEN 5-325 MG PO TABS: 1.0000 | ORAL_TABLET | Freq: Four times a day (QID) | ORAL | Status: DC | PRN

## 2012-07-06 MED ORDER — IOHEXOL 300 MG/ML  SOLN: 25.0000 mL | INTRAMUSCULAR | Status: AC

## 2012-07-06 MED ORDER — ONDANSETRON HCL 4 MG/2ML IJ SOLN
4.0000 mg | Freq: Once | INTRAMUSCULAR | Status: AC
  Administered 2012-07-06: 4 mg via INTRAVENOUS
  Filled 2012-07-06: qty 2

## 2012-07-06 MED ORDER — CIPROFLOXACIN HCL 500 MG PO TABS: 500.0000 mg | ORAL_TABLET | Freq: Two times a day (BID) | ORAL | Status: DC

## 2012-07-06 MED ORDER — METRONIDAZOLE 500 MG PO TABS: 500.0000 mg | ORAL_TABLET | Freq: Two times a day (BID) | ORAL | Status: DC

## 2012-07-06 MED ORDER — IOHEXOL 300 MG/ML  SOLN
100.0000 mL | Freq: Once | INTRAMUSCULAR | Status: AC | PRN
  Administered 2012-07-06: 100 mL via INTRAVENOUS

## 2012-07-06 NOTE — ED Notes (Signed)
Crystal Cathey Pt's significant other [phone]

## 2012-07-06 NOTE — ED Notes (Signed)
Dr Sheldon at bedside  

## 2012-07-06 NOTE — ED Notes (Signed)
Pt transported to CT ?

## 2012-07-06 NOTE — ED Notes (Signed)
CT notified pt finished with first cup of contrast and starting on second.

## 2012-07-06 NOTE — ED Notes (Signed)
Pt c/o sharp rt lower abd pain around the umbilicus that began a few days ago, pt also states he has hx of back pain, states he hurts on both sides that is different from the other times. Rates pain 10/10. Dr. Bernette Mayers at bedside. Wife states pt had some diarrhea this morning.

## 2012-07-06 NOTE — ED Notes (Signed)
Discharge instructions reviewed. Pt verbalized understanding.  

## 2012-07-06 NOTE — ED Notes (Signed)
Onset 3-4 days ago RLQ abdominal pain radiating to right flank pain. 10/10 achy sharp. States drinks 12 beers or more on a given day.  States recently having erectile dysfunction.

## 2012-07-06 NOTE — ED Provider Notes (Signed)
History     CSN: [id number]  Arrival date & time 07/06/12  1018   First MD Initiated Contact with Patient 07/06/12 1127      Chief Complaint  Patient presents with  . Abdominal Pain  . Flank Pain    (Consider location/radiation/quality/duration/timing/severity/associated sxs/prior treatment) Patient is a 42 y.o. male presenting with abdominal pain and flank pain.  Abdominal Pain Flank Pain Associated symptoms include abdominal pain.   Pt reports he has had moderate to severe aching RLQ abdominal pain worsening over the last 2 days associated with R flank pain, anorexia and low grade fever. He has not had any nausea or vomiting, but began having some diarrhea this AM. He is a heavy drinker, but denies any history of liver or pancreas problems.   Past Medical History  Diagnosis Date  . Alcohol abuse   . Anemia   . Severe major depression with psychotic features     History reviewed. No pertinent past surgical history.  No family history on file.  History  Substance Use Topics  . Smoking status: Current Every Day Smoker -- 0.50 packs/day    Types: Cigarettes  . Smokeless tobacco: Not on file  . Alcohol Use: 0.0 oz/week     Comment: heavy       Review of Systems  Gastrointestinal: Positive for abdominal pain.  Genitourinary: Positive for flank pain.   All other systems reviewed and are negative except as noted in HPI.   Allergies  Sertraline  Home Medications   Current Outpatient Rx  Name  Route  Sig  Dispense  Refill  . metroNIDAZOLE (FLAGYL) 500 MG tablet   Oral   Take 1 tablet (500 mg total) by mouth 2 (two) times daily.   14 tablet   0   . Multiple Vitamin (MULITIVITAMIN WITH MINERALS) TABS   Oral   Take 1 tablet by mouth daily.         . risperiDONE (RISPERDAL) 3 MG tablet   Oral   Take 3 mg by mouth at bedtime.         Marland Kitchen EXPIRED: risperiDONE (RISPERDAL) 3 MG tablet   Oral   Take 1 tablet (3 mg total) by mouth daily.   14 tablet   0    . traZODone (DESYREL) 100 MG tablet   Oral   Take 100 mg by mouth at bedtime.         Marland Kitchen EXPIRED: traZODone (DESYREL) 100 MG tablet   Oral   Take 1 tablet (100 mg total) by mouth at bedtime.   14 tablet   0     BP 129/77  Pulse 82  Temp(Src) 99.3 F (37.4 C) (Oral)  Resp 16  SpO2 98%  Physical Exam  Nursing note and vitals reviewed. Constitutional: He is oriented to person, place, and time. He appears well-developed and well-nourished.  HENT:  Head: Normocephalic and atraumatic.  Eyes: EOM are normal. Pupils are equal, round, and reactive to light.  Neck: Normal range of motion. Neck supple.  Cardiovascular: Normal rate, normal heart sounds and intact distal pulses.   Pulmonary/Chest: Effort normal and breath sounds normal.  Abdominal: Bowel sounds are normal. He exhibits no distension. There is tenderness (RLQ). There is guarding. There is no rebound.  Musculoskeletal: Normal range of motion. He exhibits tenderness (R lumbar paraspinal muscles). He exhibits no edema.  Neurological: He is alert and oriented to person, place, and time. He has normal strength. No cranial nerve deficit or sensory deficit.  Skin: Skin is warm and dry. No rash noted.  Psychiatric: He has a normal mood and affect.    ED Course  Procedures (including critical care time)  Labs Reviewed  CBC WITH DIFFERENTIAL - Abnormal; Notable for the following:    WBC 12.4 (*)    RBC 3.90 (*)    Hemoglobin 12.2 (*)    HCT 36.8 (*)    Neutro Abs 8.9 (*)    Monocytes Absolute 1.5 (*)    All other components within normal limits  COMPREHENSIVE METABOLIC PANEL - Abnormal; Notable for the following:    Glucose, Bld 110 (*)    All other components within normal limits  LIPASE, BLOOD  URINALYSIS, ROUTINE W REFLEX MICROSCOPIC   Ct Abdomen Pelvis W Contrast  07/06/2012  *RADIOLOGY REPORT*  Clinical Data: Right lower quadrant/right flank abdominal pain  CT ABDOMEN AND PELVIS WITH CONTRAST  Technique:   Multidetector CT imaging of the abdomen and pelvis was performed following the standard protocol during bolus administration of intravenous contrast.  Contrast: OMNIPAQUE IOHEXOL 300 MG/ML  SOLN  Comparison: 06/14/2011  Findings: Mild dependent atelectasis at the lung bases.  Liver, spleen, pancreas, and adrenal glands are within normal limits.  Gallbladder is unremarkable.  No intrahepatic or extrahepatic ductal dilatation.  Kidneys are within normal limits.  No hydronephrosis.  No evidence of bowel obstruction.  Normal appendix.  Wall thickening/inflammatory changes involving the ascending colon (series 2/image 43).  A few scattered colonic diverticula are present in this region.  No drainable fluid collection or abscess.  No free air.  No evidence of abdominal aortic aneurysm.  No abdominopelvic ascites.  8 mm short axis ileocolic node (series 2/image 47).  Prostate is unremarkable.  Bladder is within normal limits.  Visualized osseous structures are within normal limits.  IMPRESSION: Wall thickening/inflammatory changes involving the ascending colon, possibly reflecting diverticulitis or colitis.  Given the appearance, follow-up colonoscopy is suggested to exclude underlying colonic neoplasm.  No drainable fluid collection/abscess or free air.  Associated 8 mm short-axis ileocolic node.   Original Report Authenticated By: Charline Bills, M.D.      No diagnosis found.    MDM  Pain improved, labs and imaging as above neg for appendicitis, but shows some inflammation in ascending colon corresponding to his tenderness. Advised of these findings and need for GI followup. Will treat for infectious colitis but cannot rule out IBD. Pt also asking for treatment for erectile dysfunction. Advised that this needs to be address by PCP. Pt advised not to drink EtOH while taking flagyl.         Charles B. Bernette Mayers, MD 07/06/12 1539

## 2012-07-07 ENCOUNTER — Emergency Department (HOSPITAL_COMMUNITY)
Admission: EM | Admit: 2012-07-07 | Discharge: 2012-07-07 | Disposition: A | Discharge status: Other Acute Inpatient Hospital | Payer: Self-pay

## 2012-07-07 ENCOUNTER — Encounter: Payer: Self-pay | Admitting: Gastroenterology

## 2012-07-11 ENCOUNTER — Encounter (HOSPITAL_COMMUNITY): Payer: Self-pay | Admitting: *Deleted

## 2012-07-11 ENCOUNTER — Emergency Department (HOSPITAL_COMMUNITY)
Admission: EM | Admit: 2012-07-11 | Discharge: 2012-07-12 | Discharge status: Against Medical Advice | Payer: Self-pay | Attending: Emergency Medicine | Admitting: Emergency Medicine

## 2012-07-11 DIAGNOSIS — M545 Low back pain, unspecified: Secondary | ICD-10-CM | POA: Insufficient documentation

## 2012-07-11 HISTORY — DX: Noninfective gastroenteritis and colitis, unspecified: K52.9

## 2012-07-11 LAB — CBC WITH DIFFERENTIAL/PLATELET
Basophils Relative: 1 % (ref 0–1)
Eosinophils Absolute: 0.2 10*3/uL (ref 0.0–0.7)
Eosinophils Relative: 2 % (ref 0–5)
HCT: 38.7 % — ABNORMAL LOW (ref 39.0–52.0)
Hemoglobin: 12.5 g/dL — ABNORMAL LOW (ref 13.0–17.0)
MCH: 31.1 pg (ref 26.0–34.0)
MCHC: 32.3 g/dL (ref 30.0–36.0)
MCV: 96.3 fL (ref 78.0–100.0)
Monocytes Absolute: 0.7 10*3/uL (ref 0.1–1.0)
Monocytes Relative: 10 % (ref 3–12)
RDW: 14.8 % (ref 11.5–15.5)

## 2012-07-11 LAB — COMPREHENSIVE METABOLIC PANEL
ALT: 7 U/L (ref 0–53)
AST: 17 U/L (ref 0–37)
Alkaline Phosphatase: 47 U/L (ref 39–117)
BUN: 7 mg/dL (ref 6–23)
Calcium: 9.8 mg/dL (ref 8.4–10.5)
Chloride: 105 mEq/L (ref 96–112)
Creatinine, Ser: 0.96 mg/dL (ref 0.50–1.35)
GFR calc Af Amer: >90 mL/min (ref >90)
GFR calc non Af Amer: >90 mL/min (ref >90)
Total Protein: 7.4 g/dL (ref 6.0–8.3)

## 2012-07-11 NOTE — ED Notes (Signed)
Pt was seen here on 3/5 and was seen and diagnosed with colitis.  Pt states that he had dark stool one hour ago and so he came back.  Pt states that he filled his percocet and it is not working.  Pt states that he is still having abdominal and lower back pain.

## 2012-07-11 NOTE — ED Notes (Signed)
The pt advised of the wait time 

## 2012-07-11 NOTE — ED Notes (Signed)
Called patient for room placement, no answer  

## 2012-07-17 ENCOUNTER — Emergency Department (HOSPITAL_COMMUNITY)
Admission: EM | Admit: 2012-07-17 | Discharge: 2012-07-17 | Disposition: A | Discharge status: Home / Self Care | Payer: Self-pay | Attending: Emergency Medicine | Admitting: Emergency Medicine

## 2012-07-17 ENCOUNTER — Encounter (HOSPITAL_COMMUNITY): Payer: Self-pay | Admitting: Family Medicine

## 2012-07-17 DIAGNOSIS — Z862 Personal history of diseases of the blood and blood-forming organs and certain disorders involving the immune mechanism: Secondary | ICD-10-CM | POA: Insufficient documentation

## 2012-07-17 DIAGNOSIS — R110 Nausea: Secondary | ICD-10-CM | POA: Insufficient documentation

## 2012-07-17 DIAGNOSIS — K529 Noninfective gastroenteritis and colitis, unspecified: Secondary | ICD-10-CM

## 2012-07-17 DIAGNOSIS — F1010 Alcohol abuse, uncomplicated: Secondary | ICD-10-CM | POA: Insufficient documentation

## 2012-07-17 DIAGNOSIS — Z8659 Personal history of other mental and behavioral disorders: Secondary | ICD-10-CM | POA: Insufficient documentation

## 2012-07-17 DIAGNOSIS — K5289 Other specified noninfective gastroenteritis and colitis: Secondary | ICD-10-CM | POA: Insufficient documentation

## 2012-07-17 DIAGNOSIS — F17200 Nicotine dependence, unspecified, uncomplicated: Secondary | ICD-10-CM | POA: Insufficient documentation

## 2012-07-17 LAB — COMPREHENSIVE METABOLIC PANEL
CO2: 27 mEq/L (ref 19–32)
Calcium: 9.5 mg/dL (ref 8.4–10.5)
Chloride: 103 mEq/L (ref 96–112)
Creatinine, Ser: 0.92 mg/dL (ref 0.50–1.35)
GFR calc Af Amer: >90 mL/min (ref >90)
GFR calc non Af Amer: >90 mL/min (ref >90)
Glucose, Bld: 99 mg/dL (ref 70–99)
Total Bilirubin: 0.3 mg/dL (ref 0.3–1.2)

## 2012-07-17 LAB — CBC WITH DIFFERENTIAL/PLATELET
Eosinophils Relative: 3 % (ref 0–5)
HCT: 41.9 % (ref 39.0–52.0)
Hemoglobin: 13.7 g/dL (ref 13.0–17.0)
Lymphocytes Relative: 44 % (ref 12–46)
Lymphs Abs: 3.2 10*3/uL (ref 0.7–4.0)
MCV: 96.8 fL (ref 78.0–100.0)
Monocytes Absolute: 0.7 10*3/uL (ref 0.1–1.0)
Monocytes Relative: 9 % (ref 3–12)
RBC: 4.33 MIL/uL (ref 4.22–5.81)
WBC: 7.3 10*3/uL (ref 4.0–10.5)

## 2012-07-17 LAB — URINALYSIS, ROUTINE W REFLEX MICROSCOPIC
Hgb urine dipstick: NEGATIVE
Ketones, ur: NEGATIVE mg/dL
Leukocytes, UA: NEGATIVE
Protein, ur: NEGATIVE mg/dL
Urobilinogen, UA: 1 mg/dL (ref 0.0–1.0)

## 2012-07-17 LAB — OCCULT BLOOD, POC DEVICE: Fecal Occult Bld: NEGATIVE

## 2012-07-17 MED ORDER — SODIUM CHLORIDE 0.9 % IV BOLUS (SEPSIS)
1000.0000 mL | Freq: Once | INTRAVENOUS | Status: AC
  Administered 2012-07-17: 1000 mL via INTRAVENOUS

## 2012-07-17 MED ORDER — SODIUM CHLORIDE 0.9 % IV SOLN: INTRAVENOUS | Status: DC

## 2012-07-17 MED ORDER — HYDROCODONE-ACETAMINOPHEN 5-325 MG PO TABS: 2.0000 | ORAL_TABLET | ORAL | Status: DC | PRN

## 2012-07-17 MED ORDER — HYDROMORPHONE HCL PF 1 MG/ML IJ SOLN
1.0000 mg | Freq: Once | INTRAMUSCULAR | Status: AC
  Administered 2012-07-17: 1 mg via INTRAVENOUS
  Filled 2012-07-17: qty 1

## 2012-07-17 MED ORDER — ONDANSETRON HCL 4 MG/2ML IJ SOLN
4.0000 mg | Freq: Once | INTRAMUSCULAR | Status: AC
  Administered 2012-07-17: 4 mg via INTRAVENOUS
  Filled 2012-07-17: qty 2

## 2012-07-17 NOTE — ED Notes (Signed)
Pt sts increased RUQ pain with dark stools. sts pain radiating into back. Denies N,V.

## 2012-07-17 NOTE — ED Provider Notes (Signed)
History     CSN: [id number]  Arrival date & time 07/17/12  1857   First MD Initiated Contact with Patient 07/17/12 1946      Chief Complaint  Patient presents with  . Abdominal Pain    (Consider location/radiation/quality/duration/timing/severity/associated sxs/prior treatment) HPI This 42 year old alcoholic male has a history of constant right-sided abdominal pain radiating to right flank gradual loss 24 hours a day for the last 2 weeks with recent CT scan this month showing evidence of ascending colitis, he is on 2 antibiotics but ran out of his pain medication, he does not have an appointment with gastroenterology for at least another week, he has had constant severe right sided abdominal pain associated with nausea but without vomiting, but now has developed a few dark loose stools over the last 2 days with no history of GI bleeding in the past, he has no fever no cough no shortness of breath no syncope and his right-sided abdominal pain has been severe gradually worsening for the last 2 weeks. Past Medical History  Diagnosis Date  . Alcohol abuse   . Anemia   . Severe major depression with psychotic features   . Colitis     History reviewed. No pertinent past surgical history.  History reviewed. No pertinent family history.  History  Substance Use Topics  . Smoking status: Current Every Day Smoker -- 0.50 packs/day    Types: Cigarettes  . Smokeless tobacco: Not on file  . Alcohol Use: 0.0 oz/week     Comment: heavy       Review of Systems 10 Systems reviewed and are negative for acute change except as noted in the HPI. Allergies  Sertraline  Home Medications   Current Outpatient Rx  Name  Route  Sig  Dispense  Refill  . ciprofloxacin (CIPRO) 500 MG tablet   Oral   Take 1 tablet (500 mg total) by mouth 2 (two) times daily.   28 tablet   0   . HYDROcodone-acetaminophen (NORCO) 5-325 MG per tablet   Oral   Take 2 tablets by mouth every 4 (four) hours as  needed for pain.   20 tablet   0   . metroNIDAZOLE (FLAGYL) 500 MG tablet   Oral   Take 1 tablet (500 mg total) by mouth 2 (two) times daily.   28 tablet   0     BP 151/96  Pulse 71  Temp(Src) 98.4 F (36.9 C) (Oral)  Resp 18  Ht 5\' 7"  (1.702 m)  Wt 145 lb (65.772 kg)  BMI 22.71 kg/m2  SpO2 100%  Physical Exam  Nursing note and vitals reviewed. Constitutional:  Awake, alert, nontoxic appearance.  HENT:  Head: Atraumatic.  Eyes: Right eye exhibits no discharge. Left eye exhibits no discharge.  Neck: Neck supple.  Cardiovascular: Normal rate and regular rhythm.   No murmur heard. Pulmonary/Chest: Effort normal and breath sounds normal. No respiratory distress. He has no wheezes. He has no rales. He exhibits no tenderness.  Abdominal: Soft. Bowel sounds are normal. He exhibits no distension and no mass. There is tenderness. There is guarding. There is no rebound.  Moderate right upper and lower abdominal tenderness without rebound  Genitourinary:  Testicles are nontender no palpable inguinal hernias  Musculoskeletal: He exhibits no tenderness.  Baseline ROM, no obvious new focal weakness.  Neurological: He is alert.  Mental status and motor strength appears baseline for patient and situation.  Skin: No rash noted.  Psychiatric: He has a normal mood  and affect.    ED Course  Procedures (including critical care time) Pt feels improved after observation and/or treatment in ED. I doubt sepsis or peritonitis or appendicitis based on recent CT scan and patient's clinical course.   Labs Reviewed  CBC WITH DIFFERENTIAL  COMPREHENSIVE METABOLIC PANEL  LIPASE, BLOOD  URINALYSIS, ROUTINE W REFLEX MICROSCOPIC  OCCULT BLOOD, POC DEVICE   No results found.   1. Colitis   2. Alcohol dependence       MDM   I doubt any other EMC precluding discharge at this time including, but not necessarily limited to the following:sepsis, peritonitis, appendicitis.       Hurman Horn, MD 07/18/12 [phone]

## 2012-07-28 ENCOUNTER — Telehealth: Payer: Self-pay | Admitting: Gastroenterology

## 2012-07-28 ENCOUNTER — Ambulatory Visit: Payer: Self-pay | Admitting: Gastroenterology

## 2012-07-28 NOTE — Telephone Encounter (Signed)
Message copied by Arna Snipe on Thu Jul 28, 2012  1:18 PM ------      Message from: Ok Anis A      Created: Thu Jul 28, 2012 10:41 AM       Please charge these two patients  ------

## 2012-08-03 ENCOUNTER — Encounter (HOSPITAL_COMMUNITY): Payer: Self-pay | Admitting: *Deleted

## 2012-08-03 ENCOUNTER — Emergency Department (HOSPITAL_COMMUNITY)
Admission: EM | Admit: 2012-08-03 | Discharge: 2012-08-03 | Disposition: A | Discharge status: Home / Self Care | Payer: Self-pay | Attending: Emergency Medicine | Admitting: Emergency Medicine

## 2012-08-03 DIAGNOSIS — Z862 Personal history of diseases of the blood and blood-forming organs and certain disorders involving the immune mechanism: Secondary | ICD-10-CM | POA: Insufficient documentation

## 2012-08-03 DIAGNOSIS — R109 Unspecified abdominal pain: Secondary | ICD-10-CM

## 2012-08-03 DIAGNOSIS — F1010 Alcohol abuse, uncomplicated: Secondary | ICD-10-CM | POA: Insufficient documentation

## 2012-08-03 DIAGNOSIS — K5289 Other specified noninfective gastroenteritis and colitis: Secondary | ICD-10-CM | POA: Insufficient documentation

## 2012-08-03 DIAGNOSIS — F17200 Nicotine dependence, unspecified, uncomplicated: Secondary | ICD-10-CM | POA: Insufficient documentation

## 2012-08-03 DIAGNOSIS — K529 Noninfective gastroenteritis and colitis, unspecified: Secondary | ICD-10-CM

## 2012-08-03 DIAGNOSIS — Z8659 Personal history of other mental and behavioral disorders: Secondary | ICD-10-CM | POA: Insufficient documentation

## 2012-08-03 DIAGNOSIS — R112 Nausea with vomiting, unspecified: Secondary | ICD-10-CM

## 2012-08-03 DIAGNOSIS — R5381 Other malaise: Secondary | ICD-10-CM | POA: Insufficient documentation

## 2012-08-03 LAB — COMPREHENSIVE METABOLIC PANEL
AST: 18 U/L (ref 0–37)
Albumin: 4.3 g/dL (ref 3.5–5.2)
BUN: 9 mg/dL (ref 6–23)
Calcium: 10.1 mg/dL (ref 8.4–10.5)
Chloride: 102 mEq/L (ref 96–112)
Creatinine, Ser: 0.9 mg/dL (ref 0.50–1.35)
Total Bilirubin: 0.5 mg/dL (ref 0.3–1.2)
Total Protein: 7.7 g/dL (ref 6.0–8.3)

## 2012-08-03 LAB — CBC WITH DIFFERENTIAL/PLATELET
Basophils Absolute: 0.1 10*3/uL (ref 0.0–0.1)
Basophils Relative: 1 % (ref 0–1)
Eosinophils Absolute: 0.3 10*3/uL (ref 0.0–0.7)
HCT: 43.4 % (ref 39.0–52.0)
Hemoglobin: 14.8 g/dL (ref 13.0–17.0)
MCH: 31.8 pg (ref 26.0–34.0)
MCHC: 34.1 g/dL (ref 30.0–36.0)
Monocytes Absolute: 0.6 10*3/uL (ref 0.1–1.0)
Monocytes Relative: 11 % (ref 3–12)
Neutro Abs: 1.6 10*3/uL — ABNORMAL LOW (ref 1.7–7.7)
RDW: 13.6 % (ref 11.5–15.5)

## 2012-08-03 LAB — URINALYSIS, MICROSCOPIC ONLY
Glucose, UA: NEGATIVE mg/dL
Ketones, ur: 15 mg/dL — AB
Leukocytes, UA: NEGATIVE
Nitrite: NEGATIVE
Specific Gravity, Urine: 1.027 (ref 1.005–1.030)
pH: 5 (ref 5.0–8.0)

## 2012-08-03 LAB — LIPASE, BLOOD: Lipase: 50 U/L (ref 11–59)

## 2012-08-03 MED ORDER — PROMETHAZINE HCL 25 MG PO TABS: 25.0000 mg | ORAL_TABLET | Freq: Four times a day (QID) | ORAL | Status: DC | PRN

## 2012-08-03 MED ORDER — ONDANSETRON 4 MG PO TBDP
8.0000 mg | ORAL_TABLET | Freq: Once | ORAL | Status: AC
  Administered 2012-08-03: 8 mg via ORAL
  Filled 2012-08-03: qty 2

## 2012-08-03 MED ORDER — MORPHINE SULFATE 4 MG/ML IJ SOLN
4.0000 mg | Freq: Once | INTRAMUSCULAR | Status: AC
  Administered 2012-08-03: 4 mg via INTRAVENOUS
  Filled 2012-08-03: qty 1

## 2012-08-03 MED ORDER — HYDROCODONE-ACETAMINOPHEN 5-325 MG PO TABS: 2.0000 | ORAL_TABLET | ORAL | Status: DC | PRN

## 2012-08-03 NOTE — ED Provider Notes (Signed)
Medical screening examination/treatment/procedure(s) were conducted as a shared visit with non-physician practitioner(s) and myself.  I personally evaluated the patient during the encounter   Loren Racer, MD 08/03/12 [phone]

## 2012-08-03 NOTE — ED Provider Notes (Signed)
History     CSN: [id number]  Arrival date & time 08/03/12  1956   First MD Initiated Contact with Patient 08/03/12 2054      Chief Complaint  Patient presents with  . Abdominal Pain    (Consider location/radiation/quality/duration/timing/severity/associated sxs/prior treatment) HPI Comments: The patient is a 42 year old male who presents today with abdominal pain that he has had for the past 3 weeks. He has been evaluated for this and treated for colitis with pain medication and antibiotics. He states his pain has remained unchanged for the past 3 weeks. It is a sharp pain in his right and upper and lower quadrants with radiation to his right flank. 2 days ago he started vomiting which is non bloody. He states it is not related to eating, however his wife states it is. He abuses alcohol. He has not been to follow up with GI. He states he made an appointment, but had to cancel. He will follow up next week. No fever, chills, diarrhea, shortness of breath, CP, or dysuria.   The history is provided by the patient. No language interpreter was used.    Past Medical History  Diagnosis Date  . Alcohol abuse   . Anemia   . Severe major depression with psychotic features   . Colitis     History reviewed. No pertinent past surgical history.  History reviewed. No pertinent family history.  History  Substance Use Topics  . Smoking status: Current Every Day Smoker -- 0.33 packs/day    Types: Cigarettes  . Smokeless tobacco: Not on file  . Alcohol Use: 0.0 oz/week     Comment: heavy       Review of Systems  Constitutional: Negative for fever and chills.  Gastrointestinal: Positive for nausea, vomiting and abdominal pain. Negative for diarrhea and constipation.  Genitourinary: Negative for dysuria, urgency, frequency and hematuria.  Neurological: Positive for weakness (generalized). Negative for numbness.  All other systems reviewed and are negative.    Allergies   Sertraline  Home Medications  No current outpatient prescriptions on file.  BP 135/78  Pulse 80  Temp(Src) 98.4 F (36.9 C) (Oral)  Resp 16  SpO2 100%  Physical Exam  Nursing note and vitals reviewed. Constitutional: He is oriented to person, place, and time. He appears well-developed and well-nourished. No distress.  HENT:  Head: Normocephalic and atraumatic.  Right Ear: External ear normal.  Left Ear: External ear normal.  Nose: Nose normal.  Mouth/Throat: Oropharynx is clear and moist.  Eyes: Conjunctivae are normal.  Neck: Normal range of motion. No tracheal deviation present.  Cardiovascular: Normal rate, regular rhythm, normal heart sounds and intact distal pulses.   Pulmonary/Chest: Effort normal and breath sounds normal. No stridor.  Abdominal: Soft. Normal appearance and bowel sounds are normal. He exhibits no distension. There is no hepatosplenomegaly. There is tenderness in the right upper quadrant and right lower quadrant. There is guarding. There is no rebound and no CVA tenderness. No hernia.  Musculoskeletal: Normal range of motion.  Neurological: He is alert and oriented to person, place, and time.  Skin: Skin is warm and dry. He is not diaphoretic.  Psychiatric: He has a normal mood and affect. His behavior is normal.    ED Course  Procedures (including critical care time)  Labs Reviewed  CBC WITH DIFFERENTIAL - Abnormal; Notable for the following:    Neutrophils Relative 30 (*)    Neutro Abs 1.6 (*)    Lymphocytes Relative 52 (*)  Eosinophils Relative 6 (*)    All other components within normal limits  COMPREHENSIVE METABOLIC PANEL - Abnormal; Notable for the following:    Glucose, Bld 118 (*)    All other components within normal limits  URINALYSIS, MICROSCOPIC ONLY - Abnormal; Notable for the following:    Ketones, ur 15 (*)    All other components within normal limits  LIPASE, BLOOD   No results found.   1. Colitis   2. Abdominal pain    3. Nausea & vomiting       MDM  Patient presents today for reevaluation of his abdominal pain diagnosed as colitis on 3/13 likely due to the fact that he ran out of pain medication. Tenderness in right upper and lower quadrants. CT from 2 weeks ago showed colitis. Treated with flagyl. No indication for repeat imaging. Must follow up with GI. No peritoneal signs on exam. No concern for appendicitis, cholecystitis. Afebrile. Labs WNL. Patient does not appear distressed. Pain controlled in ED with morphine. Sent home with 6 norco and told to follow up with GI. Vital signs stable for discharge. Return precautions given. Patient / Family / Caregiver informed of clinical course, understand medical decision-making process, and agree with plan.        Mora Bellman, PA-C 08/03/12 2349

## 2012-08-03 NOTE — ED Notes (Signed)
Pt aware that we need urine specimen. 

## 2012-08-03 NOTE — ED Notes (Signed)
Pt c/o abdominal pain x 3 weeks, states he was recently dx with colitis.  Ran out of pain meds and abx.  C/o n/v/d.

## 2012-08-08 ENCOUNTER — Encounter (HOSPITAL_COMMUNITY): Payer: Self-pay | Admitting: Nurse Practitioner

## 2012-08-08 ENCOUNTER — Emergency Department (HOSPITAL_COMMUNITY)
Admission: EM | Admit: 2012-08-08 | Discharge: 2012-08-08 | Discharge status: Against Medical Advice | Payer: Self-pay | Attending: Emergency Medicine | Admitting: Emergency Medicine

## 2012-08-08 DIAGNOSIS — R109 Unspecified abdominal pain: Secondary | ICD-10-CM | POA: Insufficient documentation

## 2012-08-08 DIAGNOSIS — K5289 Other specified noninfective gastroenteritis and colitis: Secondary | ICD-10-CM | POA: Insufficient documentation

## 2012-08-08 DIAGNOSIS — F17200 Nicotine dependence, unspecified, uncomplicated: Secondary | ICD-10-CM | POA: Insufficient documentation

## 2012-08-08 LAB — CBC WITH DIFFERENTIAL/PLATELET
Hemoglobin: 16.8 g/dL (ref 13.0–17.0)
Lymphocytes Relative: 41 % (ref 12–46)
Lymphs Abs: 3.1 10*3/uL (ref 0.7–4.0)
MCV: 94.3 fL (ref 78.0–100.0)
Monocytes Relative: 5 % (ref 3–12)
Neutrophils Relative %: 52 % (ref 43–77)
Platelets: 259 10*3/uL (ref 150–400)
RBC: 5.26 MIL/uL (ref 4.22–5.81)
WBC: 7.5 10*3/uL (ref 4.0–10.5)

## 2012-08-08 LAB — COMPREHENSIVE METABOLIC PANEL
ALT: 11 U/L (ref 0–53)
AST: 26 U/L (ref 0–37)
CO2: 31 mEq/L (ref 19–32)
Calcium: 9.8 mg/dL (ref 8.4–10.5)
Creatinine, Ser: 0.88 mg/dL (ref 0.50–1.35)
GFR calc Af Amer: >90 mL/min (ref >90)
GFR calc non Af Amer: >90 mL/min (ref >90)
Sodium: 146 mEq/L — ABNORMAL HIGH (ref 135–145)
Total Protein: 8.4 g/dL — ABNORMAL HIGH (ref 6.0–8.3)

## 2012-08-08 NOTE — ED Notes (Signed)
Assumed patient has left without being seen after triage

## 2012-08-08 NOTE — ED Notes (Signed)
Called x2, no answer 

## 2012-08-08 NOTE — ED Notes (Addendum)
Per ems: pt diagnosed with colitis mar 5, c/o continued abd pain since. Pt was unable to f/u with PCP and ran out of pain medication. Pt reports he has been drinking beer today

## 2012-08-08 NOTE — ED Notes (Signed)
No answer x 3; called in waiting room

## 2012-08-08 NOTE — ED Notes (Signed)
Called pt x 1, no answer 

## 2012-08-14 ENCOUNTER — Emergency Department (HOSPITAL_COMMUNITY)
Admission: EM | Admit: 2012-08-14 | Discharge: 2012-08-14 | Disposition: A | Discharge status: Home / Self Care | Payer: Self-pay | Attending: Emergency Medicine | Admitting: Emergency Medicine

## 2012-08-14 ENCOUNTER — Encounter (HOSPITAL_COMMUNITY): Payer: Self-pay | Admitting: *Deleted

## 2012-08-14 DIAGNOSIS — Z8659 Personal history of other mental and behavioral disorders: Secondary | ICD-10-CM | POA: Insufficient documentation

## 2012-08-14 DIAGNOSIS — T65891A Toxic effect of other specified substances, accidental (unintentional), initial encounter: Secondary | ICD-10-CM | POA: Insufficient documentation

## 2012-08-14 DIAGNOSIS — K5289 Other specified noninfective gastroenteritis and colitis: Secondary | ICD-10-CM | POA: Insufficient documentation

## 2012-08-14 DIAGNOSIS — T2662XA Corrosion of cornea and conjunctival sac, left eye, initial encounter: Secondary | ICD-10-CM

## 2012-08-14 DIAGNOSIS — F17200 Nicotine dependence, unspecified, uncomplicated: Secondary | ICD-10-CM | POA: Insufficient documentation

## 2012-08-14 DIAGNOSIS — Z79899 Other long term (current) drug therapy: Secondary | ICD-10-CM | POA: Insufficient documentation

## 2012-08-14 DIAGNOSIS — T2660XA Corrosion of cornea and conjunctival sac, unspecified eye, initial encounter: Secondary | ICD-10-CM | POA: Insufficient documentation

## 2012-08-14 DIAGNOSIS — F1010 Alcohol abuse, uncomplicated: Secondary | ICD-10-CM | POA: Insufficient documentation

## 2012-08-14 DIAGNOSIS — Z862 Personal history of diseases of the blood and blood-forming organs and certain disorders involving the immune mechanism: Secondary | ICD-10-CM | POA: Insufficient documentation

## 2012-08-14 DIAGNOSIS — Y939 Activity, unspecified: Secondary | ICD-10-CM | POA: Insufficient documentation

## 2012-08-14 DIAGNOSIS — Y929 Unspecified place or not applicable: Secondary | ICD-10-CM | POA: Insufficient documentation

## 2012-08-14 MED ORDER — TEARS RENEWED OP SOLN: 1.0000 [drp] | Freq: Four times a day (QID) | OPHTHALMIC | Status: DC | PRN

## 2012-08-14 MED ORDER — TETRACAINE HCL 0.5 % OP SOLN
1.0000 [drp] | Freq: Once | OPHTHALMIC | Status: AC
  Administered 2012-08-14: 1 [drp] via OPHTHALMIC
  Filled 2012-08-14: qty 2

## 2012-08-14 MED ORDER — ERYTHROMYCIN 5 MG/GM OP OINT
TOPICAL_OINTMENT | Freq: Once | OPHTHALMIC | Status: AC
  Administered 2012-08-14: 23:00:00 via OPHTHALMIC
  Filled 2012-08-14: qty 3.5

## 2012-08-14 MED ORDER — FLUORESCEIN SODIUM 1 MG OP STRP
2.0000 | ORAL_STRIP | Freq: Once | OPHTHALMIC | Status: AC
  Administered 2012-08-14: 2 via OPHTHALMIC
  Filled 2012-08-14: qty 1

## 2012-08-14 MED ORDER — ACETAMINOPHEN-CODEINE #2 300-15 MG PO TABS: 1.0000 | ORAL_TABLET | ORAL | Status: DC | PRN
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
## 2012-08-14 NOTE — ED Notes (Signed)
EMS called to home.  Found patient ambulatory.  Patient walked to truck. Patient states that he splashed bleach in his left eye while cleaning. Patient  States that he washed his eye for 5 minutes before EMS arrived.

## 2012-08-14 NOTE — ED Notes (Signed)
Patient is alert and oriented x3.  He was given DC instructions and follow up visit instructions.  Patient gave verbal understanding.  He was DC ambulatory under his own power to home.  V/S stable.  He was not showing any signs of distress on DC 

## 2012-08-14 NOTE — ED Notes (Signed)
ZOX:[id number]<AV> Expected date:08/14/12<BR> Expected time: 8:21 PM<BR> Means of arrival:Ambulance<BR> Comments:<BR> Bleach in eyes

## 2012-09-18 ENCOUNTER — Emergency Department (HOSPITAL_COMMUNITY)
Admission: EM | Admit: 2012-09-18 | Discharge: 2012-09-18 | Disposition: A | Discharge status: Home / Self Care | Payer: Self-pay | Attending: Emergency Medicine | Admitting: Emergency Medicine

## 2012-09-18 ENCOUNTER — Encounter (HOSPITAL_COMMUNITY): Payer: Self-pay | Admitting: *Deleted

## 2012-09-18 DIAGNOSIS — F17200 Nicotine dependence, unspecified, uncomplicated: Secondary | ICD-10-CM | POA: Insufficient documentation

## 2012-09-18 DIAGNOSIS — Z8719 Personal history of other diseases of the digestive system: Secondary | ICD-10-CM | POA: Insufficient documentation

## 2012-09-18 DIAGNOSIS — M545 Low back pain, unspecified: Secondary | ICD-10-CM | POA: Insufficient documentation

## 2012-09-18 DIAGNOSIS — Z79899 Other long term (current) drug therapy: Secondary | ICD-10-CM | POA: Insufficient documentation

## 2012-09-18 DIAGNOSIS — Z862 Personal history of diseases of the blood and blood-forming organs and certain disorders involving the immune mechanism: Secondary | ICD-10-CM | POA: Insufficient documentation

## 2012-09-18 DIAGNOSIS — Z792 Long term (current) use of antibiotics: Secondary | ICD-10-CM | POA: Insufficient documentation

## 2012-09-18 DIAGNOSIS — Z8659 Personal history of other mental and behavioral disorders: Secondary | ICD-10-CM | POA: Insufficient documentation

## 2012-09-18 MED ORDER — IBUPROFEN 800 MG PO TABS: 800.0000 mg | ORAL_TABLET | Freq: Three times a day (TID) | ORAL | Status: DC

## 2012-09-18 MED ORDER — CYCLOBENZAPRINE HCL 10 MG PO TABS: 10.0000 mg | ORAL_TABLET | Freq: Two times a day (BID) | ORAL | Status: DC | PRN

## 2012-09-18 MED ORDER — IBUPROFEN 800 MG PO TABS
800.0000 mg | ORAL_TABLET | Freq: Once | ORAL | Status: AC
  Administered 2012-09-18: 800 mg via ORAL
  Filled 2012-09-18: qty 1

## 2012-09-18 MED ORDER — CYCLOBENZAPRINE HCL 10 MG PO TABS
5.0000 mg | ORAL_TABLET | Freq: Once | ORAL | Status: AC
  Administered 2012-09-18: 5 mg via ORAL
  Filled 2012-09-18: qty 1
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
## 2012-09-18 NOTE — ED Notes (Signed)
Pt states that he has chronic back pain, and it worsened while grocery shopping yesterday.

## 2012-09-18 NOTE — ED Notes (Signed)
PT REFUSES ICE PACK. PT STATES "ICE PACK MAY PUT ME IN SHOCK"

## 2012-09-18 NOTE — ED Notes (Signed)
Pt sleeping in the waiting room.
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
## 2012-09-25 ENCOUNTER — Encounter (HOSPITAL_COMMUNITY): Payer: Self-pay | Admitting: *Deleted

## 2012-09-25 DIAGNOSIS — Z862 Personal history of diseases of the blood and blood-forming organs and certain disorders involving the immune mechanism: Secondary | ICD-10-CM | POA: Insufficient documentation

## 2012-09-25 DIAGNOSIS — R5381 Other malaise: Secondary | ICD-10-CM | POA: Insufficient documentation

## 2012-09-25 DIAGNOSIS — K921 Melena: Secondary | ICD-10-CM | POA: Insufficient documentation

## 2012-09-25 DIAGNOSIS — R109 Unspecified abdominal pain: Secondary | ICD-10-CM | POA: Insufficient documentation

## 2012-09-25 DIAGNOSIS — Z8659 Personal history of other mental and behavioral disorders: Secondary | ICD-10-CM | POA: Insufficient documentation

## 2012-09-25 DIAGNOSIS — Z8719 Personal history of other diseases of the digestive system: Secondary | ICD-10-CM | POA: Insufficient documentation

## 2012-09-25 DIAGNOSIS — F17200 Nicotine dependence, unspecified, uncomplicated: Secondary | ICD-10-CM | POA: Insufficient documentation

## 2012-09-25 DIAGNOSIS — R42 Dizziness and giddiness: Secondary | ICD-10-CM | POA: Insufficient documentation

## 2012-09-25 DIAGNOSIS — R5383 Other fatigue: Secondary | ICD-10-CM | POA: Insufficient documentation

## 2012-09-25 DIAGNOSIS — F1010 Alcohol abuse, uncomplicated: Secondary | ICD-10-CM | POA: Insufficient documentation

## 2012-09-25 DIAGNOSIS — R110 Nausea: Secondary | ICD-10-CM | POA: Insufficient documentation

## 2012-09-25 LAB — URINALYSIS, ROUTINE W REFLEX MICROSCOPIC
Glucose, UA: NEGATIVE mg/dL
Ketones, ur: 15 mg/dL — AB
Leukocytes, UA: NEGATIVE
pH: 5.5 (ref 5.0–8.0)

## 2012-09-25 LAB — COMPREHENSIVE METABOLIC PANEL
Albumin: 4.1 g/dL (ref 3.5–5.2)
Alkaline Phosphatase: 61 U/L (ref 39–117)
BUN: 9 mg/dL (ref 6–23)
Potassium: 4.2 mEq/L (ref 3.5–5.1)
Sodium: 140 mEq/L (ref 135–145)
Total Protein: 8 g/dL (ref 6.0–8.3)

## 2012-09-25 LAB — CBC WITH DIFFERENTIAL/PLATELET
Basophils Absolute: 0.1 10*3/uL (ref 0.0–0.1)
Basophils Relative: 1 % (ref 0–1)
Eosinophils Absolute: 0.2 10*3/uL (ref 0.0–0.7)
MCH: 32.6 pg (ref 26.0–34.0)
MCHC: 33.9 g/dL (ref 30.0–36.0)
Neutrophils Relative %: 46 % (ref 43–77)
Platelets: 215 10*3/uL (ref 150–400)
RDW: 14.8 % (ref 11.5–15.5)

## 2012-09-25 MED ORDER — ONDANSETRON 4 MG PO TBDP
8.0000 mg | ORAL_TABLET | Freq: Once | ORAL | Status: AC
  Administered 2012-09-25: 8 mg via ORAL
  Filled 2012-09-25: qty 2

## 2012-09-25 NOTE — ED Notes (Signed)
NURSE FIRST ROUNDS: NURSE EXPLAINED , DELAY , WAIT TIME AND PROCESS TO PT. , VITAL SIGNS STABLE , RESPIRATIONS UNLABORED / DENIES PAIN AT THIS TIME .

## 2012-09-25 NOTE — ED Notes (Addendum)
C/o R axillary side pain, also R flank pain. Mentions: nausea, fatigue, dizziness, diarrhea and "very dark" stools & dark urine. Denies vomiting or fever. 'thinks it may be r/t his colitis". No meds PTA, last ETOH 2d ago.

## 2012-09-26 ENCOUNTER — Emergency Department (HOSPITAL_COMMUNITY)
Admission: EM | Admit: 2012-09-26 | Discharge: 2012-09-26 | Disposition: A | Discharge status: Home / Self Care | Payer: Self-pay | Attending: Emergency Medicine | Admitting: Emergency Medicine

## 2012-09-26 ENCOUNTER — Emergency Department (HOSPITAL_COMMUNITY): Payer: Self-pay

## 2012-09-26 DIAGNOSIS — R109 Unspecified abdominal pain: Secondary | ICD-10-CM

## 2012-09-26 MED ORDER — IOHEXOL 300 MG/ML  SOLN
100.0000 mL | Freq: Once | INTRAMUSCULAR | Status: AC | PRN
  Administered 2012-09-26: 100 mL via INTRAVENOUS

## 2012-09-26 MED ORDER — ONDANSETRON HCL 4 MG/2ML IJ SOLN
4.0000 mg | Freq: Once | INTRAMUSCULAR | Status: AC
  Administered 2012-09-26: 4 mg via INTRAVENOUS
  Filled 2012-09-26: qty 2

## 2012-09-26 MED ORDER — HYDROMORPHONE HCL PF 1 MG/ML IJ SOLN
1.0000 mg | Freq: Once | INTRAMUSCULAR | Status: AC
  Administered 2012-09-26: 1 mg via INTRAVENOUS
  Filled 2012-09-26: qty 1

## 2012-09-26 MED ORDER — IOHEXOL 300 MG/ML  SOLN
50.0000 mL | Freq: Once | INTRAMUSCULAR | Status: AC | PRN
  Administered 2012-09-26: 50 mL via ORAL

## 2012-09-26 MED ORDER — PROMETHAZINE HCL 25 MG/ML IJ SOLN
25.0000 mg | Freq: Once | INTRAMUSCULAR | Status: AC
  Administered 2012-09-26: 25 mg via INTRAVENOUS
  Filled 2012-09-26: qty 1

## 2012-09-26 MED ORDER — SODIUM CHLORIDE 0.9 % IV BOLUS (SEPSIS)
1000.0000 mL | Freq: Once | INTRAVENOUS | Status: AC
  Administered 2012-09-26: 1000 mL via INTRAVENOUS

## 2012-09-26 MED ORDER — HYDROCODONE-ACETAMINOPHEN 5-325 MG PO TABS: 2.0000 | ORAL_TABLET | Freq: Four times a day (QID) | ORAL | Status: DC | PRN

## 2012-09-26 MED ORDER — PROMETHAZINE HCL 25 MG PO TABS: 25.0000 mg | ORAL_TABLET | Freq: Four times a day (QID) | ORAL | Status: DC | PRN

## 2012-09-26 NOTE — ED Notes (Addendum)
Pt to CT

## 2012-09-26 NOTE — ED Notes (Signed)
Back from CT

## 2012-09-26 NOTE — ED Notes (Signed)
Pt reports "right side pain" that radiates to LLQ - pt states he was recently diagnosed w/ colitis in March - pt admits to nausea, denies vomiting - states his stools and urine have gotten darker as well. Pt A&Ox4 in no acute distress. Family at bedside supplied w/ nourishment.

## 2012-09-26 NOTE — ED Provider Notes (Signed)
History     CSN: [id number]  Arrival date & time 09/25/12  2014   First MD Initiated Contact with Patient 09/26/12 0055      Chief Complaint  Patient presents with  . Flank Pain  . Abdominal Pain  . Nausea  . Dizziness  . Fatigue  . Melena    (Consider location/radiation/quality/duration/timing/severity/associated sxs/prior treatment) HPI HX per PT- persistent recurrent R flank and ABD pain, mostly RLQ has h/o colitis and this feels the same, sharp andf cramping pain, associated nausea no vomitng or diarrhea, no blood in stools. Has been on ABx for this in the last - has never had colonoscopy. No PCP. No rash or recent travel, no F/C. Pain  Mod in severity, present for the last 2 days.  Past Medical History  Diagnosis Date  . Alcohol abuse   . Anemia   . Severe major depression with psychotic features   . Colitis     History reviewed. No pertinent past surgical history.  No family history on file.  History  Substance Use Topics  . Smoking status: Current Every Day Smoker -- 0.33 packs/day    Types: Cigarettes  . Smokeless tobacco: Not on file  . Alcohol Use: 0.0 oz/week     Comment: heavy       Review of Systems  Constitutional: Negative for fever and chills.  HENT: Negative for neck pain and neck stiffness.   Eyes: Negative for pain.  Respiratory: Negative for shortness of breath.   Cardiovascular: Negative for chest pain.  Gastrointestinal: Positive for nausea and abdominal pain.  Genitourinary: Positive for flank pain. Negative for dysuria.  Musculoskeletal: Negative for back pain.  Skin: Negative for rash.  Neurological: Negative for headaches.  All other systems reviewed and are negative.    Allergies  Sertraline  Home Medications   Current Outpatient Rx  Name  Route  Sig  Dispense  Refill  . HYDROcodone-acetaminophen (NORCO/VICODIN) 5-325 MG per tablet   Oral   Take 2 tablets by mouth every 6 (six) hours as needed for pain.   12 tablet    0   . promethazine (PHENERGAN) 25 MG tablet   Oral   Take 1 tablet (25 mg total) by mouth every 6 (six) hours as needed for nausea.   30 tablet   0     BP 138/91  Pulse 69  Temp(Src) 99.1 F (37.3 C) (Oral)  Resp 14  SpO2 98%  Physical Exam  Constitutional: He is oriented to person, place, and time. He appears well-developed and well-nourished.  HENT:  Head: Normocephalic and atraumatic.  Eyes: EOM are normal. Pupils are equal, round, and reactive to light. No scleral icterus.  Neck: Neck supple.  Cardiovascular: Normal rate, regular rhythm and intact distal pulses.   Pulmonary/Chest: Effort normal and breath sounds normal. No respiratory distress. He exhibits no tenderness.  Abdominal: Soft. Bowel sounds are normal. He exhibits no distension.  TTP RLQ neg murphy's sign, no CVAT  Musculoskeletal: Normal range of motion. He exhibits no edema.  Neurological: He is alert and oriented to person, place, and time.  Skin: Skin is warm and dry. No rash noted.    ED Course  Procedures (including critical care time)  Labs Reviewed  COMPREHENSIVE METABOLIC PANEL - Abnormal; Notable for the following:    Glucose, Bld 102 (*)    All other components within normal limits  URINALYSIS, ROUTINE W REFLEX MICROSCOPIC - Abnormal; Notable for the following:    Ketones, ur 15 (*)  All other components within normal limits  CBC WITH DIFFERENTIAL   Ct Abdomen Pelvis W Contrast  09/26/2012   *RADIOLOGY REPORT*  Clinical Data: Right lower quadrant abdominal pain; nausea, vomiting and diarrhea.  CT ABDOMEN AND PELVIS WITH CONTRAST  Technique:  Multidetector CT imaging of the abdomen and pelvis was performed following the standard protocol during bolus administration of intravenous contrast.  Contrast: OMNIPAQUE IOHEXOL 300 MG/ML  SOLN  Comparison: CT of the abdomen and pelvis performed 07/06/2012  Findings: Mild bibasilar atelectasis is noted.  The liver and spleen are unremarkable in  appearance.  The gallbladder is within normal limits.  The pancreas and adrenal glands are unremarkable.  The kidneys are unremarkable in appearance.  There is no evidence of hydronephrosis.  No renal or ureteral stones are seen.  No perinephric stranding is appreciated.  No free fluid is identified.  The small bowel is unremarkable in appearance.  The stomach is within normal limits.  No acute vascular abnormalities are seen.  The appendix is normal in caliber and air, without evidence for appendicitis.  A single diverticulum is noted along the proximal sigmoid colon.  The colon is unremarkable in appearance.  The bladder is mildly distended and grossly unremarkable in appearance.  The prostate is mildly enlarged, measuring 5.0 cm in transverse dimension.  No inguinal lymphadenopathy is seen.  No acute osseous abnormalities are identified.  IMPRESSION:  1.  No acute abnormalities seen within the abdomen or pelvis. 2.  Mildly enlarged prostate. 3.  Mild bibasilar atelectasis noted.   Original Report Authenticated By: Tonia Ghent, M.D.     1. Abdominal  pain, other specified site    IVFs, IV Dilaudid, IV zofran  CT and labs reviewed. Plan d/c home, follow up GI with referral, pain RX and precautions provided. Colitis precautions and diet instruction given.  MDM  RLQ ABD pain  Evaluated CT scan and labs Treated IVFs and IV narcotics  VS, prior records and nursing notes reviewed       Sunnie Nielsen, MD 09/26/12 [phone]

## 2012-09-26 NOTE — ED Notes (Signed)
Pt alert, NAD, calm, interactive, resps e/u, speaking in clear complete sentences, family at [facility], finished PO contrast (tolerated, no emesis), CT made aware, pending CT, rates pain 10/10, "nausea better, but still remains".

## 2013-02-09 ENCOUNTER — Emergency Department (HOSPITAL_COMMUNITY): Payer: Self-pay

## 2013-02-09 ENCOUNTER — Encounter (HOSPITAL_COMMUNITY): Payer: Self-pay | Admitting: Emergency Medicine

## 2013-02-09 ENCOUNTER — Emergency Department (HOSPITAL_COMMUNITY)
Admission: EM | Admit: 2013-02-09 | Discharge: 2013-02-09 | Disposition: A | Discharge status: Home / Self Care | Payer: Self-pay | Attending: Emergency Medicine | Admitting: Emergency Medicine

## 2013-02-09 DIAGNOSIS — Z8659 Personal history of other mental and behavioral disorders: Secondary | ICD-10-CM | POA: Insufficient documentation

## 2013-02-09 DIAGNOSIS — F17200 Nicotine dependence, unspecified, uncomplicated: Secondary | ICD-10-CM | POA: Insufficient documentation

## 2013-02-09 DIAGNOSIS — M545 Low back pain, unspecified: Secondary | ICD-10-CM | POA: Insufficient documentation

## 2013-02-09 DIAGNOSIS — Z862 Personal history of diseases of the blood and blood-forming organs and certain disorders involving the immune mechanism: Secondary | ICD-10-CM | POA: Insufficient documentation

## 2013-02-09 DIAGNOSIS — R109 Unspecified abdominal pain: Secondary | ICD-10-CM | POA: Insufficient documentation

## 2013-02-09 DIAGNOSIS — R110 Nausea: Secondary | ICD-10-CM | POA: Insufficient documentation

## 2013-02-09 LAB — CBC WITH DIFFERENTIAL/PLATELET
HCT: 39.3 % (ref 39.0–52.0)
Hemoglobin: 13.7 g/dL (ref 13.0–17.0)
Lymphocytes Relative: 37 % (ref 12–46)
Lymphs Abs: 1.8 10*3/uL (ref 0.7–4.0)
MCHC: 34.9 g/dL (ref 30.0–36.0)
Monocytes Absolute: 0.6 10*3/uL (ref 0.1–1.0)
Monocytes Relative: 13 % — ABNORMAL HIGH (ref 3–12)
Neutro Abs: 2.3 10*3/uL (ref 1.7–7.7)
WBC: 4.9 10*3/uL (ref 4.0–10.5)

## 2013-02-09 LAB — URINALYSIS, ROUTINE W REFLEX MICROSCOPIC
Bilirubin Urine: NEGATIVE
Ketones, ur: 40 mg/dL — AB
Leukocytes, UA: NEGATIVE
Nitrite: NEGATIVE
Protein, ur: NEGATIVE mg/dL

## 2013-02-09 LAB — RAPID URINE DRUG SCREEN, HOSP PERFORMED
Amphetamines: NOT DETECTED
Barbiturates: NOT DETECTED
Benzodiazepines: NOT DETECTED
Cocaine: NOT DETECTED
Tetrahydrocannabinol: POSITIVE — AB

## 2013-02-09 LAB — COMPREHENSIVE METABOLIC PANEL
BUN: 8 mg/dL (ref 6–23)
CO2: 28 mEq/L (ref 19–32)
Chloride: 100 mEq/L (ref 96–112)
Creatinine, Ser: 0.77 mg/dL (ref 0.50–1.35)
GFR calc non Af Amer: >90 mL/min (ref >90)
Glucose, Bld: 79 mg/dL (ref 70–99)
Total Bilirubin: 0.9 mg/dL (ref 0.3–1.2)

## 2013-02-09 LAB — LIPASE, BLOOD: Lipase: 40 U/L (ref 11–59)

## 2013-02-09 MED ORDER — IOHEXOL 300 MG/ML  SOLN
100.0000 mL | Freq: Once | INTRAMUSCULAR | Status: AC | PRN
  Administered 2013-02-09: 100 mL via INTRAVENOUS

## 2013-02-09 MED ORDER — MORPHINE SULFATE 4 MG/ML IJ SOLN
4.0000 mg | Freq: Once | INTRAMUSCULAR | Status: AC
Start: 2013-02-09 — End: 2013-02-09
  Administered 2013-02-09: 4 mg via INTRAVENOUS
  Filled 2013-02-09: qty 1

## 2013-02-09 MED ORDER — ONDANSETRON HCL 4 MG PO TABS: 4.0000 mg | ORAL_TABLET | Freq: Four times a day (QID) | ORAL | Status: DC

## 2013-02-09 MED ORDER — HYDROCODONE-ACETAMINOPHEN 5-325 MG PO TABS: 2.0000 | ORAL_TABLET | ORAL | Status: DC | PRN

## 2013-02-09 MED ORDER — IBUPROFEN 800 MG PO TABS: 800.0000 mg | ORAL_TABLET | Freq: Three times a day (TID) | ORAL | Status: DC

## 2013-02-09 MED ORDER — SODIUM CHLORIDE 0.9 % IV BOLUS (SEPSIS)
1000.0000 mL | Freq: Once | INTRAVENOUS | Status: AC
  Administered 2013-02-09: 1000 mL via INTRAVENOUS

## 2013-02-09 MED ORDER — KETOROLAC TROMETHAMINE 30 MG/ML IJ SOLN
30.0000 mg | Freq: Once | INTRAMUSCULAR | Status: AC
  Administered 2013-02-09: 30 mg via INTRAVENOUS
  Filled 2013-02-09: qty 1

## 2013-02-09 MED ORDER — ONDANSETRON HCL 4 MG/2ML IJ SOLN
4.0000 mg | Freq: Once | INTRAMUSCULAR | Status: AC
  Administered 2013-02-09: 4 mg via INTRAVENOUS
  Filled 2013-02-09: qty 2

## 2013-02-09 NOTE — ED Provider Notes (Signed)
CSN: [id number]     Arrival date & time 02/09/13  0154 History   First MD Initiated Contact with Patient 02/09/13 [phone]     Chief Complaint  Patient presents with  . Abdominal Pain   (Consider location/radiation/quality/duration/timing/severity/associated sxs/prior Treatment) HPI Comments: Patient presents with right-sided flank and abdominal pain has been constant since last night. The pain is his low back on the right side it radiates around front of his abdomen. Is associated with nausea but no vomiting. He denies any fevers, urinary symptoms or testicular pain. The pain is similar to when he was told he had "colitis" when he was in the hospital in May. He admits to drinking over the weekend and falling and hitting his head. 3 days ago. He is unsure for is limited to that. He denies any dysuria, hematuria, testicular pain. Denies any weakness, numbness or tingling, bowel or bladder incontinence.   The history is provided by the patient.    Past Medical History  Diagnosis Date  . Alcohol abuse   . Anemia   . Severe major depression with psychotic features   . Colitis    History reviewed. No pertinent past surgical history. No family history on file. History  Substance Use Topics  . Smoking status: Current Every Day Smoker -- 0.33 packs/day    Types: Cigarettes  . Smokeless tobacco: Not on file  . Alcohol Use: 0.0 oz/week     Comment: heavy     Review of Systems  Constitutional: Negative for fever, activity change and appetite change.  HENT: Negative for rhinorrhea.   Eyes: Negative for visual disturbance.  Respiratory: Negative for cough, chest tightness and shortness of breath.   Cardiovascular: Negative for chest pain.  Gastrointestinal: Positive for nausea and abdominal pain. Negative for vomiting.  Genitourinary: Negative for dysuria, hematuria and testicular pain.  Musculoskeletal: Positive for back pain.  Skin: Negative for rash.  Neurological: Negative for dizziness,  weakness and headaches.  A complete 10 system review of systems was obtained and all systems are negative except as noted in the HPI and PMH.    Allergies  Sertraline  Home Medications   Current Outpatient Rx  Name  Route  Sig  Dispense  Refill  . ibuprofen (ADVIL,MOTRIN) 200 MG tablet   Oral   Take 200 mg by mouth every 6 (six) hours as needed for pain.         Marland Kitchen ibuprofen (ADVIL,MOTRIN) 800 MG tablet   Oral   Take 1 tablet (800 mg total) by mouth 3 (three) times daily.   21 tablet   0   . ondansetron (ZOFRAN) 4 MG tablet   Oral   Take 1 tablet (4 mg total) by mouth every 6 (six) hours.   12 tablet   0    BP 143/89  Pulse 60  Temp(Src) 98.4 F (36.9 C) (Oral)  Resp 16  Wt 139 lb 8 oz (63.277 kg)  BMI 21.84 kg/m2  SpO2 98% Physical Exam  Constitutional: He is oriented to person, place, and time. He appears well-developed and well-nourished. No distress.  HENT:  Head: Normocephalic and atraumatic.  Mouth/Throat: Oropharynx is clear and moist. No oropharyngeal exudate.  Eyes: Conjunctivae and EOM are normal. Pupils are equal, round, and reactive to light.  Neck: Normal range of motion. Neck supple.  Cardiovascular: Normal rate, regular rhythm and normal heart sounds.   No murmur heard. Pulmonary/Chest: Effort normal and breath sounds normal. No respiratory distress.  Abdominal: Soft. There is no  tenderness. There is no rebound and no guarding.  Genitourinary:  No testicular tenderness  Musculoskeletal: Normal range of motion. He exhibits tenderness.  R CVAT 5/5 strength in bilateral lower extremities. Ankle plantar and dorsiflexion intact. Great toe extension intact bilaterally. +2 DP and PT pulses. +2 patellar reflexes bilaterally. Normal gait.   Neurological: He is alert and oriented to person, place, and time. No cranial nerve deficit. He exhibits normal muscle tone. Coordination normal.  Skin: Skin is warm.    ED Course  Procedures (including critical  care time) Labs Review Labs Reviewed  CBC WITH DIFFERENTIAL - Abnormal; Notable for the following:    RBC 4.09 (*)    Monocytes Relative 13 (*)    All other components within normal limits  URINALYSIS, ROUTINE W REFLEX MICROSCOPIC - Abnormal; Notable for the following:    Ketones, ur 40 (*)    All other components within normal limits  URINE RAPID DRUG SCREEN (HOSP PERFORMED) - Abnormal; Notable for the following:    Tetrahydrocannabinol POSITIVE (*)    All other components within normal limits  COMPREHENSIVE METABOLIC PANEL  LIPASE, BLOOD   Imaging Review Ct Head Wo Contrast  02/09/2013   *RADIOLOGY REPORT*  Clinical Data: Hit head against corner of table.  CT HEAD WITHOUT CONTRAST  Technique:  Contiguous axial images were obtained from the base of the skull through the vertex without contrast.  Comparison: CT of the head performed 06/06/2011  Findings: There is no evidence of acute infarction, mass lesion, or intra- or extra-axial hemorrhage on CT.  Minimal periventricular and subcortical white matter change likely reflects small vessel ischemic microangiopathy.  Chronic ischemic change is noted at the left external capsule.  The posterior fossa, including the cerebellum, brainstem and fourth ventricle, is within normal limits.  The third and lateral ventricles are unremarkable in appearance.  The cerebral hemispheres are symmetric in appearance, with normal gray-white differentiation.  No mass effect or midline shift is seen.  There is no evidence of fracture; visualized osseous structures are unremarkable in appearance.  The visualized portions of the orbits are within normal limits.  The paranasal sinuses and mastoid air cells are well-aerated.  No significant soft tissue abnormalities are seen.  IMPRESSION:  1.  No evidence of traumatic intracranial injury or fracture. 2.  Minimal small vessel ischemic microangiopathy; chronic ischemic change at the left external capsule.   Original Report  Authenticated By: Tonia Ghent, M.D.   Ct Abdomen Pelvis W Contrast  02/09/2013   *RADIOLOGY REPORT*  Clinical Data: Abdominal pain and nausea.  CT ABDOMEN AND PELVIS WITH CONTRAST  Technique:  Multidetector CT imaging of the abdomen and pelvis was performed following the standard protocol during bolus administration of intravenous contrast.  Contrast: OMNIPAQUE IOHEXOL 300 MG/ML  SOLN  Comparison: CT of the abdomen and pelvis Sep 26, 2012.  Findings: Mild motion degraded examination.  Limited view of the lung bases are clear.  The included heart and pericardium are unremarkable.  The liver is diffusely hypodense, and otherwise unremarkable.  The spleen, pancreas, gallbladder and adrenal glands are normal in size, morphology and enhancement characteristics.  The stomach, small and large bowel are normal in course and caliber without definite wall thickening or inflammatory changes though sensitivity may be decreased by lack of enteric contrast.  Normal appendix.  No free fluid nor free air.  The kidneys are well-located, demonstrating normal morphology, size and enhancement without renal masses, nephrolithiasis or hydronephrosis.  Ureters are unremarkable.  Urinary bladder  is under distended and appears normal.  Great vessels are normal in course and caliber.  No lymphadenopathy by CT size criteria.  Prostate is non suspicious.  The included soft tissues and osseous structures are non-suspicious. Moderate bilateral sacroiliac osteoarthrosis.  IMPRESSION: No acute intra-abdominal or pelvic process on this mild motion degraded examination.  Fatty liver.   Original Report Authenticated By: Awilda Metro    MDM   1. Flank pain    Flank pain without neurological deficit. Abdomen exam soft and benign. Patient concerned that he feels nauseated because he has had several days ago. Neurologically intact.  Urinalysis shows ketones but no infection or hematuria. Labs unremarkable. CT scan shows no  evidence of colitis or other acute intra-abdominal process. CT head is negative.  Patient tolerating by mouth in the ED. Reduced her pain and nausea medications and PCP followup.   Glynn Octave, MD 02/09/13 [phone]

## 2013-02-09 NOTE — ED Notes (Addendum)
Pt. arrived with EMS from [facility] reports right/mid abdominal pain with nausea for several days , pt. Also reported that he hit his head against the corner of a table last week . Pt. stated he drank ETOH this evening .

## 2013-02-15 ENCOUNTER — Emergency Department (HOSPITAL_COMMUNITY)
Admission: EM | Admit: 2013-02-15 | Discharge: 2013-02-15 | Disposition: A | Discharge status: Home / Self Care | Payer: Self-pay | Attending: Emergency Medicine | Admitting: Emergency Medicine

## 2013-02-15 ENCOUNTER — Encounter (HOSPITAL_COMMUNITY): Payer: Self-pay | Admitting: Emergency Medicine

## 2013-02-15 DIAGNOSIS — Z862 Personal history of diseases of the blood and blood-forming organs and certain disorders involving the immune mechanism: Secondary | ICD-10-CM | POA: Insufficient documentation

## 2013-02-15 DIAGNOSIS — Z8659 Personal history of other mental and behavioral disorders: Secondary | ICD-10-CM | POA: Insufficient documentation

## 2013-02-15 DIAGNOSIS — Z79899 Other long term (current) drug therapy: Secondary | ICD-10-CM | POA: Insufficient documentation

## 2013-02-15 DIAGNOSIS — Z8719 Personal history of other diseases of the digestive system: Secondary | ICD-10-CM | POA: Insufficient documentation

## 2013-02-15 DIAGNOSIS — R110 Nausea: Secondary | ICD-10-CM | POA: Insufficient documentation

## 2013-02-15 DIAGNOSIS — F17200 Nicotine dependence, unspecified, uncomplicated: Secondary | ICD-10-CM | POA: Insufficient documentation

## 2013-02-15 DIAGNOSIS — R109 Unspecified abdominal pain: Secondary | ICD-10-CM | POA: Insufficient documentation

## 2013-02-15 LAB — CBC
Hemoglobin: 13.3 g/dL (ref 13.0–17.0)
MCH: 33.3 pg (ref 26.0–34.0)
MCHC: 34.3 g/dL (ref 30.0–36.0)
Platelets: 195 10*3/uL (ref 150–400)
RDW: 13 % (ref 11.5–15.5)
WBC: 5.2 10*3/uL (ref 4.0–10.5)

## 2013-02-15 LAB — COMPREHENSIVE METABOLIC PANEL
ALT: 15 U/L (ref 0–53)
Albumin: 3.8 g/dL (ref 3.5–5.2)
Alkaline Phosphatase: 45 U/L (ref 39–117)
Chloride: 103 mEq/L (ref 96–112)
Potassium: 3.7 mEq/L (ref 3.5–5.1)
Sodium: 141 mEq/L (ref 135–145)
Total Bilirubin: 0.2 mg/dL — ABNORMAL LOW (ref 0.3–1.2)
Total Protein: 7.4 g/dL (ref 6.0–8.3)

## 2013-02-15 MED ORDER — SODIUM CHLORIDE 0.9 % IV BOLUS (SEPSIS)
1000.0000 mL | Freq: Once | INTRAVENOUS | Status: AC
  Administered 2013-02-15: 1000 mL via INTRAVENOUS

## 2013-02-15 MED ORDER — HYDROCODONE-ACETAMINOPHEN 5-325 MG PO TABS: 1.0000 | ORAL_TABLET | Freq: Four times a day (QID) | ORAL | Status: DC | PRN

## 2013-02-15 MED ORDER — ONDANSETRON HCL 4 MG/2ML IJ SOLN
4.0000 mg | INTRAMUSCULAR | Status: AC
  Administered 2013-02-15: 4 mg via INTRAVENOUS
  Filled 2013-02-15: qty 2

## 2013-02-15 MED ORDER — FAMOTIDINE IN NACL 20-0.9 MG/50ML-% IV SOLN
20.0000 mg | INTRAVENOUS | Status: AC
  Administered 2013-02-15: 20 mg via INTRAVENOUS
  Filled 2013-02-15: qty 50

## 2013-02-15 MED ORDER — HYDROMORPHONE HCL PF 1 MG/ML IJ SOLN
1.0000 mg | Freq: Once | INTRAMUSCULAR | Status: AC
  Administered 2013-02-15: 1 mg via INTRAVENOUS
  Filled 2013-02-15: qty 1

## 2013-02-15 MED ORDER — MORPHINE SULFATE 4 MG/ML IJ SOLN
6.0000 mg | Freq: Once | INTRAMUSCULAR | Status: AC
  Administered 2013-02-15: 6 mg via INTRAVENOUS
  Filled 2013-02-15: qty 2

## 2013-02-15 MED ORDER — GI COCKTAIL ~~LOC~~
30.0000 mL | Freq: Once | ORAL | Status: AC
  Administered 2013-02-15: 30 mL via ORAL
  Filled 2013-02-15: qty 30

## 2013-02-15 NOTE — ED Notes (Signed)
PA at bedside.

## 2013-02-15 NOTE — ED Provider Notes (Signed)
CSN: [id number]     Arrival date & time 02/15/13  0435 History   First MD Initiated Contact with Patient 02/15/13 0439     No chief complaint on file.  (Consider location/radiation/quality/duration/timing/severity/associated sxs/prior Treatment) HPI Comments: Patient is a 42 year old male with a history of alcohol abuse and colitis who presents for diffuse abdominal pain worsening since yesterday. Patient describes the pain as sharp and aching, radiating to his back. Patient denies any alleviating factors and states he drank alcohol today and attempt to lessen his pain without success. He also took ibuprofen without relief. Patient endorses associated nausea. He denies fever, chest pain or shortness of breath, melena, hematochezia, dysuria or hematuria, and numbness or tingling. Patient was seen for the same 5 days ago at which time he had a CT of his abdomen done which was unremarkable.  The history is provided by the patient. No language interpreter was used.    Past Medical History  Diagnosis Date  . Alcohol abuse   . Anemia   . Severe major depression with psychotic features   . Colitis    History reviewed. No pertinent past surgical history. No family history on file. History  Substance Use Topics  . Smoking status: Current Every Day Smoker -- 0.33 packs/day    Types: Cigarettes  . Smokeless tobacco: Not on file  . Alcohol Use: 0.0 oz/week     Comment: heavy     Review of Systems  Constitutional: Negative for fever.  Respiratory: Negative for shortness of breath.   Cardiovascular: Negative for chest pain.  Gastrointestinal: Positive for nausea and abdominal pain. Negative for vomiting and blood in stool.  Genitourinary: Negative for dysuria.  Neurological: Negative for numbness.  All other systems reviewed and are negative.    Allergies  Sertraline  Home Medications   Current Outpatient Rx  Name  Route  Sig  Dispense  Refill  . HYDROcodone-acetaminophen  (NORCO/VICODIN) 5-325 MG per tablet   Oral   Take 2 tablets by mouth every 4 (four) hours as needed for pain.   10 tablet   0   . ibuprofen (ADVIL,MOTRIN) 200 MG tablet   Oral   Take 200 mg by mouth every 6 (six) hours as needed for pain.         Marland Kitchen ibuprofen (ADVIL,MOTRIN) 800 MG tablet   Oral   Take 1 tablet (800 mg total) by mouth 3 (three) times daily.   21 tablet   0   . ondansetron (ZOFRAN) 4 MG tablet   Oral   Take 1 tablet (4 mg total) by mouth every 6 (six) hours.   12 tablet   0    BP 124/87  Pulse 80  Temp(Src) 98.4 F (36.9 C) (Oral)  Resp 20  SpO2 97%  Physical Exam  Nursing note and vitals reviewed. Constitutional: He is oriented to person, place, and time. He appears well-developed and well-nourished. No distress.  HENT:  Head: Normocephalic and atraumatic.  Eyes: Conjunctivae and EOM are normal. No scleral icterus.  Neck: Normal range of motion.  Cardiovascular: Normal rate, regular rhythm and intact distal pulses.   Pulmonary/Chest: Effort normal. No respiratory distress. He has no wheezes.  Abdominal: Soft. He exhibits no distension. There is no tenderness (no obvious focal TTP). There is no rebound and no guarding.  No peritoneal signs or guarding.  Musculoskeletal: Normal range of motion.  Neurological: He is alert and oriented to person, place, and time.  Skin: Skin is warm and dry. No  rash noted. He is not diaphoretic. No erythema. No pallor.  Psychiatric: He has a normal mood and affect. His behavior is normal.    ED Course  Procedures (including critical care time) Labs Review Labs Reviewed  CBC - Abnormal; Notable for the following:    RBC 3.99 (*)    HCT 38.8 (*)    All other components within normal limits  COMPREHENSIVE METABOLIC PANEL - Abnormal; Notable for the following:    Total Bilirubin 0.2 (*)    All other components within normal limits  LIPASE, BLOOD - Abnormal; Notable for the following:    Lipase 66 (*)    All other  components within normal limits   Ct Abdomen Pelvis W Contrast  02/09/2013   *RADIOLOGY REPORT*  Clinical Data: Abdominal pain and nausea.  CT ABDOMEN AND PELVIS WITH CONTRAST  Technique:  Multidetector CT imaging of the abdomen and pelvis was performed following the standard protocol during bolus administration of intravenous contrast.  Contrast: OMNIPAQUE IOHEXOL 300 MG/ML  SOLN  Comparison: CT of the abdomen and pelvis Sep 26, 2012.  Findings: Mild motion degraded examination.  Limited view of the lung bases are clear.  The included heart and pericardium are unremarkable.  The liver is diffusely hypodense, and otherwise unremarkable.  The spleen, pancreas, gallbladder and adrenal glands are normal in size, morphology and enhancement characteristics.  The stomach, small and large bowel are normal in course and caliber without definite wall thickening or inflammatory changes though sensitivity may be decreased by lack of enteric contrast.  Normal appendix.  No free fluid nor free air.  The kidneys are well-located, demonstrating normal morphology, size and enhancement without renal masses, nephrolithiasis or hydronephrosis.  Ureters are unremarkable.  Urinary bladder is under distended and appears normal.  Great vessels are normal in course and caliber.  No lymphadenopathy by CT size criteria.  Prostate is non suspicious.  The included soft tissues and osseous structures are non-suspicious. Moderate bilateral sacroiliac osteoarthrosis.  IMPRESSION: No acute intra-abdominal or pelvic process on this mild motion degraded examination.  Fatty liver.   Original Report Authenticated By: Awilda Metro   Imaging Review No results found.   EKG Interpretation   None       MDM  No diagnosis found.  42 year old male presents for abdominal pain worsening since yesterday, but persistent since last seen 5 days ago. Patient well and nontoxic appearing, hemodynamically stable, and afebrile. Labs  consistent with prior is in without leukocytosis, electrolyte imbalance, or abnormal liver or kidney function. Patient's lipase is mildly elevated today; however, patient does have a history of alcohol abuse and endorses drinking alcohol today to try and alleviate his pain. Symptomatic management with IVF, IV Zofran, Pepcid, and GI cocktail ordered.  Patient signed out to Charlestine Night, PA-C at shift change for further pain control and dispo. Anticipate d/c home with GI referral and medications for symptomatic management for suspected alcoholic gastritis.  Antony Madura, PA-C 02/15/13 [phone]

## 2013-02-15 NOTE — ED Provider Notes (Signed)
Medical screening examination/treatment/procedure(s) were performed by non-physician practitioner and as supervising physician I was immediately available for consultation/collaboration.  Sunnie Nielsen, MD 02/15/13 [phone]

## 2013-02-15 NOTE — ED Notes (Signed)
Per EMS pt states his right abdominal pain started yesterday, per EMS pt reports he took some Ibuprofen and drank some alcohol to feel better but it didn't work. Per EMS pt is A&O x4. EMS states pt stated he felt like he was going to "fall out", per EMS pt reports pain and nausea.

## 2013-03-03 ENCOUNTER — Emergency Department (HOSPITAL_COMMUNITY)
Admission: EM | Admit: 2013-03-03 | Discharge: 2013-03-03 | Disposition: A | Discharge status: Home / Self Care | Payer: Self-pay | Attending: Emergency Medicine | Admitting: Emergency Medicine

## 2013-03-03 ENCOUNTER — Encounter (HOSPITAL_COMMUNITY): Payer: Self-pay | Admitting: Emergency Medicine

## 2013-03-03 DIAGNOSIS — Z791 Long term (current) use of non-steroidal anti-inflammatories (NSAID): Secondary | ICD-10-CM | POA: Insufficient documentation

## 2013-03-03 DIAGNOSIS — R109 Unspecified abdominal pain: Secondary | ICD-10-CM

## 2013-03-03 DIAGNOSIS — F17200 Nicotine dependence, unspecified, uncomplicated: Secondary | ICD-10-CM | POA: Insufficient documentation

## 2013-03-03 DIAGNOSIS — Z862 Personal history of diseases of the blood and blood-forming organs and certain disorders involving the immune mechanism: Secondary | ICD-10-CM | POA: Insufficient documentation

## 2013-03-03 DIAGNOSIS — R748 Abnormal levels of other serum enzymes: Secondary | ICD-10-CM | POA: Insufficient documentation

## 2013-03-03 DIAGNOSIS — Z79899 Other long term (current) drug therapy: Secondary | ICD-10-CM | POA: Insufficient documentation

## 2013-03-03 DIAGNOSIS — Z8719 Personal history of other diseases of the digestive system: Secondary | ICD-10-CM | POA: Insufficient documentation

## 2013-03-03 DIAGNOSIS — R1011 Right upper quadrant pain: Secondary | ICD-10-CM | POA: Insufficient documentation

## 2013-03-03 DIAGNOSIS — R1013 Epigastric pain: Secondary | ICD-10-CM | POA: Insufficient documentation

## 2013-03-03 DIAGNOSIS — Z8659 Personal history of other mental and behavioral disorders: Secondary | ICD-10-CM | POA: Insufficient documentation

## 2013-03-03 DIAGNOSIS — F1010 Alcohol abuse, uncomplicated: Secondary | ICD-10-CM | POA: Insufficient documentation

## 2013-03-03 LAB — COMPREHENSIVE METABOLIC PANEL
BUN: 9 mg/dL (ref 6–23)
CO2: 27 mEq/L (ref 19–32)
Calcium: 9.2 mg/dL (ref 8.4–10.5)
Chloride: 101 mEq/L (ref 96–112)
Creatinine, Ser: 0.79 mg/dL (ref 0.50–1.35)
GFR calc Af Amer: >90 mL/min (ref >90)
GFR calc non Af Amer: >90 mL/min (ref >90)
Glucose, Bld: 146 mg/dL — ABNORMAL HIGH (ref 70–99)
Total Bilirubin: 0.2 mg/dL — ABNORMAL LOW (ref 0.3–1.2)
Total Protein: 7.4 g/dL (ref 6.0–8.3)

## 2013-03-03 LAB — CBC WITH DIFFERENTIAL/PLATELET
Basophils Absolute: 0.1 10*3/uL (ref 0.0–0.1)
Basophils Relative: 1 % (ref 0–1)
Eosinophils Absolute: 0.1 10*3/uL (ref 0.0–0.7)
HCT: 36.9 % — ABNORMAL LOW (ref 39.0–52.0)
MCHC: 33.6 g/dL (ref 30.0–36.0)
Monocytes Absolute: 0.5 10*3/uL (ref 0.1–1.0)
Monocytes Relative: 8 % (ref 3–12)
Neutro Abs: 3.7 10*3/uL (ref 1.7–7.7)
Neutrophils Relative %: 54 % (ref 43–77)
Platelets: 179 10*3/uL (ref 150–400)
RDW: 12.9 % (ref 11.5–15.5)
WBC: 6.9 10*3/uL (ref 4.0–10.5)

## 2013-03-03 LAB — LIPASE, BLOOD: Lipase: 78 U/L — ABNORMAL HIGH (ref 11–59)

## 2013-03-03 LAB — OCCULT BLOOD, POC DEVICE: Fecal Occult Bld: NEGATIVE

## 2013-03-03 LAB — ETHANOL: Alcohol, Ethyl (B): 332 mg/dL — ABNORMAL HIGH (ref 0–11)

## 2013-03-03 MED ORDER — DICYCLOMINE HCL 10 MG/ML IM SOLN
20.0000 mg | Freq: Once | INTRAMUSCULAR | Status: AC
  Administered 2013-03-03: 20 mg via INTRAMUSCULAR
  Filled 2013-03-03: qty 2

## 2013-03-03 MED ORDER — ONDANSETRON 4 MG PO TBDP
8.0000 mg | ORAL_TABLET | Freq: Once | ORAL | Status: AC
  Administered 2013-03-03: 8 mg via ORAL
  Filled 2013-03-03: qty 2

## 2013-03-03 MED ORDER — SUCRALFATE 1 G PO TABS: 1.0000 g | ORAL_TABLET | Freq: Four times a day (QID) | ORAL | Status: DC

## 2013-03-03 MED ORDER — FAMOTIDINE 20 MG PO TABS
40.0000 mg | ORAL_TABLET | Freq: Once | ORAL | Status: AC
  Administered 2013-03-03: 40 mg via ORAL
  Filled 2013-03-03: qty 2

## 2013-03-03 MED ORDER — SUCRALFATE 1 G PO TABS
1.0000 g | ORAL_TABLET | ORAL | Status: AC
  Administered 2013-03-03: 1 g via ORAL
  Filled 2013-03-03: qty 1

## 2013-03-03 MED ORDER — ONDANSETRON 8 MG PO TBDP: 8.0000 mg | ORAL_TABLET | Freq: Three times a day (TID) | ORAL | Status: DC | PRN

## 2013-03-03 MED ORDER — FAMOTIDINE 40 MG PO TABS: 40.0000 mg | ORAL_TABLET | Freq: Every day | ORAL | Status: DC

## 2013-03-03 NOTE — ED Notes (Signed)
Per [facility], pt states hx of colitis and has had pain x 1 week.  Per [facility], pt states he drank 12 pack of beer today.  Pt currently asleep on stretcher.

## 2013-03-03 NOTE — ED Provider Notes (Signed)
CSN: [id number]     Arrival date & time 03/03/13  0106 History   First MD Initiated Contact with Patient 03/03/13 0133     Chief Complaint  Patient presents with  . Abdominal Pain   (Consider location/radiation/quality/duration/timing/severity/associated sxs/prior Treatment) HPI 42 year old male presents to emergency room with complaint of abdominal pain.  Level 5 caveat as patient is heavily intoxicated and frequently falls asleep during my evaluation.  He reports abdominal pain today.  He indicates the right side and upper abdomen.  He reports history of colitis, and feels that it may be flaring.  He denies any diarrhea, but does report some black stools.  He is unsure, if he's taken any Pepto-Bismol.  Patient has been drinking heavily today, has history of same.  Records reviewed, last colitis, was several years ago.  He has never followed up for colonoscopy.  He doesn't have a primary care Dr.  He denies any fevers.  No vomiting. Past Medical History  Diagnosis Date  . Alcohol abuse   . Anemia   . Severe major depression with psychotic features   . Colitis    History reviewed. No pertinent past surgical history. No family history on file. History  Substance Use Topics  . Smoking status: Current Every Day Smoker -- 0.33 packs/day    Types: Cigarettes  . Smokeless tobacco: Not on file  . Alcohol Use: 0.0 oz/week     Comment: heavy     Review of Systems  Unable to perform ROS: Other   intoxication  Allergies  Sertraline  Home Medications   Current Outpatient Rx  Name  Route  Sig  Dispense  Refill  . famotidine (PEPCID) 40 MG tablet   Oral   Take 1 tablet (40 mg total) by mouth daily.   30 tablet   0   . HYDROcodone-acetaminophen (NORCO/VICODIN) 5-325 MG per tablet   Oral   Take 2 tablets by mouth every 4 (four) hours as needed for pain.   10 tablet   0   . HYDROcodone-acetaminophen (NORCO/VICODIN) 5-325 MG per tablet   Oral   Take 1 tablet by mouth every 6 (six)  hours as needed for pain.   15 tablet   0   . ibuprofen (ADVIL,MOTRIN) 800 MG tablet   Oral   Take 1 tablet (800 mg total) by mouth 3 (three) times daily.   21 tablet   0   . ondansetron (ZOFRAN) 4 MG tablet   Oral   Take 1 tablet (4 mg total) by mouth every 6 (six) hours.   12 tablet   0   . ondansetron (ZOFRAN-ODT) 8 MG disintegrating tablet   Oral   Take 1 tablet (8 mg total) by mouth every 8 (eight) hours as needed for nausea.   20 tablet   0   . sucralfate (CARAFATE) 1 G tablet   Oral   Take 1 tablet (1 g total) by mouth 4 (four) times daily.   30 tablet   0    BP 118/66  Pulse 91  Temp(Src) 98.9 F (37.2 C) (Oral)  Resp 21  SpO2 96% Physical Exam  Nursing note and vitals reviewed. Constitutional: He appears well-developed and well-nourished. No distress.  HENT:  Head: Normocephalic and atraumatic.  Nose: Nose normal.  Mouth/Throat: Oropharynx is clear and moist.  Eyes: Conjunctivae and EOM are normal. Pupils are equal, round, and reactive to light.  Neck: Normal range of motion. Neck supple. No JVD present. No tracheal deviation present. No  thyromegaly present.  Cardiovascular: Normal rate, regular rhythm, normal heart sounds and intact distal pulses.  Exam reveals no gallop and no friction rub.   No murmur heard. Pulmonary/Chest: Effort normal and breath sounds normal. No stridor. No respiratory distress. He has no wheezes. He has no rales. He exhibits no tenderness.  Abdominal: Soft. Bowel sounds are normal. He exhibits no distension and no mass. There is tenderness (mild diffuse tenderness, slightly worse in epigastrium, and right upper quadrant). There is no rebound and no guarding.  Musculoskeletal: Normal range of motion. He exhibits no edema and no tenderness.  Lymphadenopathy:    He has no cervical adenopathy.  Neurological:  Somnolent but arousable  Skin: Skin is warm and dry. No rash noted. No erythema. No pallor.  Psychiatric: He has a normal  mood and affect. His behavior is normal. Judgment and thought content normal.    ED Course  Procedures (including critical care time) Labs Review Labs Reviewed  COMPREHENSIVE METABOLIC PANEL - Abnormal; Notable for the following:    Glucose, Bld 146 (*)    AST 44 (*)    Total Bilirubin 0.2 (*)    All other components within normal limits  ETHANOL - Abnormal; Notable for the following:    Alcohol, Ethyl (B) 332 (*)    All other components within normal limits  LIPASE, BLOOD - Abnormal; Notable for the following:    Lipase 78 (*)    All other components within normal limits  CBC WITH DIFFERENTIAL - Abnormal; Notable for the following:    RBC 3.79 (*)    Hemoglobin 12.4 (*)    HCT 36.9 (*)    All other components within normal limits  OCCULT BLOOD X 1 CARD TO LAB, STOOL  OCCULT BLOOD, POC DEVICE   Imaging Review No results found.  EKG Interpretation   None       MDM   1. Abdominal pain   2. Alcohol abuse   3. Elevated lipase   4. Elevated liver enzymes    42 year old male with abdominal pain.  No signs of GI bleeding.  Given negative.  Occult blood.  His labs show mild elevation in his lipase, and AST.  Patient has been advised that he needs to avoid further alcohol.  We'll treat for possible alcoholic gastritis    Olivia Mackie, MD 03/03/13 [phone]

## 2013-03-05 ENCOUNTER — Encounter (HOSPITAL_COMMUNITY): Payer: Self-pay | Admitting: *Deleted

## 2013-03-05 ENCOUNTER — Inpatient Hospital Stay (HOSPITAL_COMMUNITY)
Admission: AD | Admit: 2013-03-05 | Discharge: 2013-03-09 | DRG: 897 | Disposition: A | Discharge status: Home / Self Care | Payer: Federal, State, Local not specified - Other | Source: Intra-hospital | Attending: Psychiatry | Admitting: Psychiatry

## 2013-03-05 ENCOUNTER — Encounter (HOSPITAL_COMMUNITY): Payer: Self-pay | Admitting: Emergency Medicine

## 2013-03-05 ENCOUNTER — Emergency Department (HOSPITAL_COMMUNITY)
Admission: EM | Admit: 2013-03-05 | Discharge: 2013-03-05 | Disposition: A | Discharge status: Other Acute Inpatient Hospital | Payer: Federal, State, Local not specified - Other | Attending: Emergency Medicine | Admitting: Emergency Medicine

## 2013-03-05 DIAGNOSIS — F1020 Alcohol dependence, uncomplicated: Secondary | ICD-10-CM

## 2013-03-05 DIAGNOSIS — R45851 Suicidal ideations: Secondary | ICD-10-CM | POA: Insufficient documentation

## 2013-03-05 DIAGNOSIS — F333 Major depressive disorder, recurrent, severe with psychotic symptoms: Secondary | ICD-10-CM

## 2013-03-05 DIAGNOSIS — F321 Major depressive disorder, single episode, moderate: Secondary | ICD-10-CM | POA: Diagnosis present

## 2013-03-05 DIAGNOSIS — F10929 Alcohol use, unspecified with intoxication, unspecified: Secondary | ICD-10-CM

## 2013-03-05 DIAGNOSIS — Z862 Personal history of diseases of the blood and blood-forming organs and certain disorders involving the immune mechanism: Secondary | ICD-10-CM | POA: Insufficient documentation

## 2013-03-05 DIAGNOSIS — F329 Major depressive disorder, single episode, unspecified: Secondary | ICD-10-CM

## 2013-03-05 DIAGNOSIS — F17200 Nicotine dependence, unspecified, uncomplicated: Secondary | ICD-10-CM | POA: Insufficient documentation

## 2013-03-05 DIAGNOSIS — F1010 Alcohol abuse, uncomplicated: Secondary | ICD-10-CM | POA: Insufficient documentation

## 2013-03-05 DIAGNOSIS — F1910 Other psychoactive substance abuse, uncomplicated: Secondary | ICD-10-CM

## 2013-03-05 DIAGNOSIS — Z8719 Personal history of other diseases of the digestive system: Secondary | ICD-10-CM | POA: Insufficient documentation

## 2013-03-05 LAB — RAPID URINE DRUG SCREEN, HOSP PERFORMED
Amphetamines: NOT DETECTED
Barbiturates: NOT DETECTED
Benzodiazepines: NOT DETECTED
Cocaine: NOT DETECTED
Opiates: NOT DETECTED
Tetrahydrocannabinol: NOT DETECTED

## 2013-03-05 LAB — COMPREHENSIVE METABOLIC PANEL
ALT: 44 U/L (ref 0–53)
AST: 76 U/L — ABNORMAL HIGH (ref 0–37)
Albumin: 5.1 g/dL (ref 3.5–5.2)
CO2: 31 mEq/L (ref 19–32)
Calcium: 10.4 mg/dL (ref 8.4–10.5)
Chloride: 95 mEq/L — ABNORMAL LOW (ref 96–112)
GFR calc non Af Amer: >90 mL/min (ref >90)
Sodium: 138 mEq/L (ref 135–145)
Total Bilirubin: 0.3 mg/dL (ref 0.3–1.2)

## 2013-03-05 LAB — CBC
MCH: 33.3 pg (ref 26.0–34.0)
MCHC: 34.3 g/dL (ref 30.0–36.0)
MCV: 97.1 fL (ref 78.0–100.0)
Platelets: 214 10*3/uL (ref 150–400)
RBC: 4.42 MIL/uL (ref 4.22–5.81)
WBC: 7.9 10*3/uL (ref 4.0–10.5)

## 2013-03-05 MED ORDER — GABAPENTIN 300 MG PO CAPS
300.0000 mg | ORAL_CAPSULE | Freq: Three times a day (TID) | ORAL | Status: DC
  Administered 2013-03-06: 300 mg via ORAL
  Filled 2013-03-05: qty 1
  Filled 2013-03-05: qty 42
  Filled 2013-03-05 (×7): qty 1
  Filled 2013-03-05: qty 42
  Filled 2013-03-05: qty 1
  Filled 2013-03-05: qty 42
  Filled 2013-03-05 (×3): qty 1

## 2013-03-05 MED ORDER — NICOTINE 21 MG/24HR TD PT24: 21.0000 mg | MEDICATED_PATCH | Freq: Every day | TRANSDERMAL | Status: DC

## 2013-03-05 MED ORDER — VITAMIN B-1 100 MG PO TABS
100.0000 mg | ORAL_TABLET | Freq: Every day | ORAL | Status: DC
  Administered 2013-03-06 – 2013-03-08 (×3): 100 mg via ORAL
  Filled 2013-03-05 (×6): qty 1

## 2013-03-05 MED ORDER — CHLORDIAZEPOXIDE HCL 25 MG PO CAPS: 25.0000 mg | ORAL_CAPSULE | ORAL | Status: DC

## 2013-03-05 MED ORDER — HYDROXYZINE HCL 25 MG PO TABS
25.0000 mg | ORAL_TABLET | Freq: Four times a day (QID) | ORAL | Status: AC | PRN
  Filled 2013-03-05: qty 1

## 2013-03-05 MED ORDER — ADULT MULTIVITAMIN W/MINERALS CH
1.0000 | ORAL_TABLET | Freq: Every day | ORAL | Status: DC
  Administered 2013-03-06 – 2013-03-08 (×3): 1 via ORAL
  Filled 2013-03-05 (×6): qty 1

## 2013-03-05 MED ORDER — HYDROXYZINE HCL 25 MG PO TABS: 25.0000 mg | ORAL_TABLET | Freq: Four times a day (QID) | ORAL | Status: DC | PRN

## 2013-03-05 MED ORDER — CHLORDIAZEPOXIDE HCL 25 MG PO CAPS: 25.0000 mg | ORAL_CAPSULE | Freq: Three times a day (TID) | ORAL | Status: DC

## 2013-03-05 MED ORDER — CHLORDIAZEPOXIDE HCL 25 MG PO CAPS
25.0000 mg | ORAL_CAPSULE | Freq: Four times a day (QID) | ORAL | Status: AC
  Administered 2013-03-05 – 2013-03-06 (×4): 25 mg via ORAL
  Filled 2013-03-05 (×4): qty 1

## 2013-03-05 MED ORDER — IBUPROFEN 600 MG PO TABS: 600.0000 mg | ORAL_TABLET | Freq: Three times a day (TID) | ORAL | Status: DC | PRN

## 2013-03-05 MED ORDER — LORAZEPAM 1 MG PO TABS: 1.0000 mg | ORAL_TABLET | Freq: Three times a day (TID) | ORAL | Status: DC | PRN

## 2013-03-05 MED ORDER — CHLORDIAZEPOXIDE HCL 25 MG PO CAPS
25.0000 mg | ORAL_CAPSULE | ORAL | Status: AC
  Administered 2013-03-08 (×2): 25 mg via ORAL
  Filled 2013-03-05 (×2): qty 1

## 2013-03-05 MED ORDER — CHLORDIAZEPOXIDE HCL 25 MG PO CAPS
25.0000 mg | ORAL_CAPSULE | Freq: Three times a day (TID) | ORAL | Status: AC
  Administered 2013-03-07 (×3): 25 mg via ORAL
  Filled 2013-03-05 (×3): qty 1

## 2013-03-05 MED ORDER — CHLORDIAZEPOXIDE HCL 25 MG PO CAPS: 25.0000 mg | ORAL_CAPSULE | Freq: Every day | ORAL | Status: DC

## 2013-03-05 MED ORDER — THIAMINE HCL 100 MG/ML IJ SOLN
100.0000 mg | Freq: Once | INTRAMUSCULAR | Status: DC
  Filled 2013-03-05: qty 2

## 2013-03-05 MED ORDER — GABAPENTIN 300 MG PO CAPS
300.0000 mg | ORAL_CAPSULE | Freq: Three times a day (TID) | ORAL | Status: DC
  Administered 2013-03-05: 300 mg via ORAL
  Filled 2013-03-05 (×2): qty 1

## 2013-03-05 MED ORDER — THIAMINE HCL 100 MG/ML IJ SOLN: 100.0000 mg | Freq: Every day | INTRAMUSCULAR | Status: DC

## 2013-03-05 MED ORDER — CHLORDIAZEPOXIDE HCL 25 MG PO CAPS: 25.0000 mg | ORAL_CAPSULE | Freq: Four times a day (QID) | ORAL | Status: DC | PRN

## 2013-03-05 MED ORDER — ALUM & MAG HYDROXIDE-SIMETH 200-200-20 MG/5ML PO SUSP: 30.0000 mL | ORAL | Status: DC | PRN

## 2013-03-05 MED ORDER — ONDANSETRON HCL 4 MG PO TABS: 4.0000 mg | ORAL_TABLET | Freq: Three times a day (TID) | ORAL | Status: DC | PRN

## 2013-03-05 MED ORDER — CHLORDIAZEPOXIDE HCL 25 MG PO CAPS: 25.0000 mg | ORAL_CAPSULE | Freq: Four times a day (QID) | ORAL | Status: AC | PRN

## 2013-03-05 MED ORDER — ONDANSETRON 4 MG PO TBDP
4.0000 mg | ORAL_TABLET | Freq: Four times a day (QID) | ORAL | Status: AC | PRN
  Filled 2013-03-05: qty 1

## 2013-03-05 MED ORDER — LOPERAMIDE HCL 2 MG PO CAPS: 2.0000 mg | ORAL_CAPSULE | ORAL | Status: AC | PRN

## 2013-03-05 MED ORDER — ACETAMINOPHEN 325 MG PO TABS: 650.0000 mg | ORAL_TABLET | ORAL | Status: DC | PRN

## 2013-03-05 MED ORDER — ZOLPIDEM TARTRATE 5 MG PO TABS: 5.0000 mg | ORAL_TABLET | Freq: Every evening | ORAL | Status: DC | PRN

## 2013-03-05 MED ORDER — IBUPROFEN 200 MG PO TABS: 600.0000 mg | ORAL_TABLET | Freq: Three times a day (TID) | ORAL | Status: DC | PRN

## 2013-03-05 MED ORDER — NICOTINE 21 MG/24HR TD PT24
21.0000 mg | MEDICATED_PATCH | Freq: Every day | TRANSDERMAL | Status: DC
  Filled 2013-03-05 (×5): qty 1

## 2013-03-05 MED ORDER — TRAZODONE HCL 50 MG PO TABS
50.0000 mg | ORAL_TABLET | Freq: Every evening | ORAL | Status: DC | PRN
  Filled 2013-03-05: qty 1

## 2013-03-05 MED ORDER — MAGNESIUM HYDROXIDE 400 MG/5ML PO SUSP: 30.0000 mL | Freq: Every day | ORAL | Status: DC | PRN

## 2013-03-05 MED ORDER — CHLORDIAZEPOXIDE HCL 25 MG PO CAPS
25.0000 mg | ORAL_CAPSULE | Freq: Four times a day (QID) | ORAL | Status: DC
  Administered 2013-03-05: 25 mg via ORAL
  Filled 2013-03-05 (×2): qty 1

## 2013-03-05 MED ORDER — ADULT MULTIVITAMIN W/MINERALS CH
1.0000 | ORAL_TABLET | Freq: Every day | ORAL | Status: DC
  Administered 2013-03-05: 1 via ORAL
  Filled 2013-03-05: qty 1

## 2013-03-05 MED ORDER — LOPERAMIDE HCL 2 MG PO CAPS: 2.0000 mg | ORAL_CAPSULE | ORAL | Status: DC | PRN

## 2013-03-05 MED ORDER — ONDANSETRON 4 MG PO TBDP: 4.0000 mg | ORAL_TABLET | Freq: Four times a day (QID) | ORAL | Status: DC | PRN

## 2013-03-05 MED ORDER — CHLORDIAZEPOXIDE HCL 25 MG PO CAPS
25.0000 mg | ORAL_CAPSULE | Freq: Once | ORAL | Status: AC
  Administered 2013-03-05: 25 mg via ORAL
  Filled 2013-03-05: qty 1

## 2013-03-05 MED ORDER — THIAMINE HCL 100 MG/ML IJ SOLN
100.0000 mg | Freq: Every day | INTRAMUSCULAR | Status: DC
  Administered 2013-03-05: 100 mg via INTRAMUSCULAR
  Filled 2013-03-05: qty 2

## 2013-03-05 MED ORDER — VITAMIN B-1 100 MG PO TABS: 100.0000 mg | ORAL_TABLET | Freq: Every day | ORAL | Status: DC

## 2013-03-05 NOTE — ED Notes (Signed)
Awaiting pt to arrive to psych ed in order to complete tele assessment. RN will Lexicographer when this occurs.  Janann Colonel. MSW, LCSW-A Triage Specialist   Phone: [phone] Fax: [phone]

## 2013-03-05 NOTE — Progress Notes (Signed)
Patient did attend the evening speaker AA meeting.  

## 2013-03-05 NOTE — ED Notes (Signed)
[place] x 3 days. A & O, reports having 12 beers today.

## 2013-03-05 NOTE — Consult Note (Signed)
[facility] Face-to-Face Psychiatry Consult   Reason for Consult:  Depression and Alcohol detox Referring Physician:  EDP  [name] is an 42 y.o. male.  Assessment: AXIS I:  Alcohol Abuse, Depressive Disorder NOS and Substance Abuse AXIS II:  Deferred AXIS III:   Past Medical History  Diagnosis Date  . Alcohol abuse   . Anemia   . Severe major depression with psychotic features   . Colitis    AXIS IV:  economic problems, occupational problems, other psychosocial or environmental problems, problems related to social environment and problems with primary support group AXIS V:  41-50 serious symptoms  Plan:  Recommend psychiatric Inpatient admission when medically cleared.  Subjective:   [name] is a 42 y.o. male patient.  HPI: Patient states that he wants assistance with alcohol detox and depression. "I just feel suicidal that's all.  I also need help with alcohol.  My back/right side is hurting, and they tell me my liver is messed up. I just need help. I don't want to feel like this no mo.  I don't even feel like living." Patient endorses suicidal ideation but denies plan.  Patient denies homicidal ideation, psychosis, and paranoia.  Patient states that he drinks two to three 12 packs of beer a day.  Patient states that he does have a history of rehab a while ago and stayed sober for a couple of months.  .  Patient states that he lives with friends and he is unemployed.  Patient states that he has been inpatient psych and was taking medication.  "I was taking Risperdal but I stopped taking it cause I won't hearing the voices.  It's been a while since I heard voices."  Patient states he is not seeing anyone on outpatient basis and is not taking any medication at this time.    HPI Elements:   Location:  [facility]. Quality:  Affecting patient mentally and physically. Severity:  Patient drinking 2 to 3 12 packs of beer daily and having suicidal thoughts.  Past  Psychiatric History: Past Medical History  Diagnosis Date  . Alcohol abuse   . Anemia   . Severe major depression with psychotic features   . Colitis     reports that he has been smoking Cigarettes.  He has been smoking about 0.33 packs per day. He does not have any smokeless tobacco history on file. He reports that he drinks alcohol. He reports that he uses illicit drugs (Marijuana) about once per week. History reviewed. No pertinent family history. Family History Substance Abuse: No Family Supports: No Living Arrangements: Non-relatives/Friends Can pt return to current living arrangement?: Yes Abuse/Neglect [facility]) Physical Abuse: Denies Verbal Abuse: Denies Sexual Abuse: Denies Allergies:   Allergies  Allergen Reactions  . Sertraline Other (See Comments)    Acute GI distress with diarrhea and pain.     ACT Assessment Complete:  Yes:    Educational Status    Risk to Self: Risk to self Suicidal Ideation: Yes-Currently Present Suicidal Intent: No-Not Currently/Within Last 6 Months Is patient at risk for suicide?: Yes (pt says he is) Suicidal Plan?: No-Not Currently/Within Last 6 Months Access to Means: Yes Specify Access to Suicidal Means: pt has access to drugs What has been your use of drugs/alcohol within the last 12 months?: alcohol Previous Attempts/Gestures: No How many times?: 0 Other Self Harm Risks: na Triggers for Past Attempts: None known Intentional Self Injurious Behavior: None Family Suicide History: No Recent stressful life event(s): Financial  Problems Persecutory voices/beliefs?: No Depression: Yes Depression Symptoms: Isolating;Fatigue;Guilt;Loss of interest in usual pleasures;Feeling worthless/self pity;Feeling angry/irritable Substance abuse history and/or treatment for substance abuse?: Yes Suicide prevention information given to non-admitted patients: Not applicable  Risk to Others: Risk to Others Homicidal Ideation: No Thoughts of Harm to Others:  No Current Homicidal Intent: No Current Homicidal Plan: No Access to Homicidal Means: No Identified Victim: na History of harm to others?: No Assessment of Violence: None Noted Violent Behavior Description: cooperative but irritable Does patient have access to weapons?: No Criminal Charges Pending?: No Does patient have a court date: No  Abuse: Abuse/Neglect Assessment (Assessment to be complete while patient is alone) Physical Abuse: Denies Verbal Abuse: Denies Sexual Abuse: Denies Exploitation of patient/patient's resources: Denies Self-Neglect: Denies  Prior Inpatient Therapy: Prior Inpatient Therapy Prior Inpatient Therapy: Yes Prior Therapy Dates: 2013 Prior Therapy Facilty/Provider(s): doesn't remember Reason for Treatment: detox  Prior Outpatient Therapy: Prior Outpatient Therapy Prior Outpatient Therapy: No Prior Therapy Dates: na Prior Therapy Facilty/Provider(s): na Reason for Treatment: na  Additional Information: Additional Information 1:1 In Past 12 Months?: No CIRT Risk: No Elopement Risk: No Does patient have medical clearance?: Yes                  Objective: Blood pressure 142/79, pulse 84, temperature 98.8 F (37.1 C), temperature source Oral, resp. rate 17, SpO2 95.00%.There is no weight on file to calculate BMI. Results for orders placed during the hospital encounter of 03/05/13 (from the past 72 hour(s))  URINE RAPID DRUG SCREEN (HOSP PERFORMED)     Status: None   Collection Time    03/05/13  2:28 AM      Result Value Range   Opiates NONE DETECTED  NONE DETECTED   Cocaine NONE DETECTED  NONE DETECTED   Benzodiazepines NONE DETECTED  NONE DETECTED   Amphetamines NONE DETECTED  NONE DETECTED   Tetrahydrocannabinol NONE DETECTED  NONE DETECTED   Barbiturates NONE DETECTED  NONE DETECTED   Comment:            DRUG SCREEN FOR MEDICAL PURPOSES     ONLY.  IF CONFIRMATION IS NEEDED     FOR ANY PURPOSE, NOTIFY LAB     WITHIN 5 DAYS.                 LOWEST DETECTABLE LIMITS     FOR URINE DRUG SCREEN     Drug Class       Cutoff (ng/mL)     Amphetamine      1000     Barbiturate      200     Benzodiazepine   200     Tricyclics       300     Opiates          300     Cocaine          300     THC              50  ACETAMINOPHEN LEVEL     Status: None   Collection Time    03/05/13  2:32 AM      Result Value Range   Acetaminophen (Tylenol), Serum <15.0  10 - 30 ug/mL   Comment:            THERAPEUTIC CONCENTRATIONS VARY     SIGNIFICANTLY. A RANGE OF 10-30     ug/mL MAY BE AN EFFECTIVE     CONCENTRATION FOR MANY PATIENTS.  HOWEVER, SOME ARE BEST TREATED     AT CONCENTRATIONS OUTSIDE THIS     RANGE.     ACETAMINOPHEN CONCENTRATIONS     >150 ug/mL AT 4 HOURS AFTER     INGESTION AND >50 ug/mL AT 12     HOURS AFTER INGESTION ARE     OFTEN ASSOCIATED WITH TOXIC     REACTIONS.  CBC     Status: None   Collection Time    03/05/13  2:32 AM      Result Value Range   WBC 7.9  4.0 - 10.5 K/uL   RBC 4.42  4.22 - 5.81 MIL/uL   Hemoglobin 14.7  13.0 - 17.0 g/dL   HCT 16.1  09.6 - 04.5 %   MCV 97.1  78.0 - 100.0 fL   MCH 33.3  26.0 - 34.0 pg   MCHC 34.3  30.0 - 36.0 g/dL   RDW 40.9  81.1 - 91.4 %   Platelets 214  150 - 400 K/uL  COMPREHENSIVE METABOLIC PANEL     Status: Abnormal   Collection Time    03/05/13  2:32 AM      Result Value Range   Sodium 138  135 - 145 mEq/L   Potassium 3.5  3.5 - 5.1 mEq/L   Chloride 95 (*) 96 - 112 mEq/L   CO2 31  19 - 32 mEq/L   Glucose, Bld 94  70 - 99 mg/dL   BUN 7  6 - 23 mg/dL   Creatinine, Ser 7.82  0.50 - 1.35 mg/dL   Calcium 95.6  8.4 - 21.3 mg/dL   Total Protein 9.3 (*) 6.0 - 8.3 g/dL   Albumin 5.1  3.5 - 5.2 g/dL   AST 76 (*) 0 - 37 U/L   ALT 44  0 - 53 U/L   Alkaline Phosphatase 56  39 - 117 U/L   Total Bilirubin 0.3  0.3 - 1.2 mg/dL   GFR calc non Af Amer >90  >90 mL/min   GFR calc Af Amer >90  >90 mL/min   Comment: (NOTE)     The eGFR has been calculated using the CKD  EPI equation.     This calculation has not been validated in all clinical situations.     eGFR's persistently <90 mL/min signify possible Chronic Kidney     Disease.  ETHANOL     Status: Abnormal   Collection Time    03/05/13  2:32 AM      Result Value Range   Alcohol, Ethyl (B) 345 (*) 0 - 11 mg/dL   Comment:            LOWEST DETECTABLE LIMIT FOR     SERUM ALCOHOL IS 11 mg/dL     FOR MEDICAL PURPOSES ONLY  SALICYLATE LEVEL     Status: Abnormal   Collection Time    03/05/13  2:32 AM      Result Value Range   Salicylate Lvl <2.0 (*) 2.8 - 20.0 mg/dL     Current Facility-Administered Medications  Medication Dose Route Frequency Provider Last Rate Last Dose  . acetaminophen (TYLENOL) tablet 650 mg  650 mg Oral Q4H PRN Dagmar Hait, MD      . alum & mag hydroxide-simeth (MAALOX/MYLANTA) 200-200-20 MG/5ML suspension 30 mL  30 mL Oral PRN Dagmar Hait, MD      . ibuprofen (ADVIL,MOTRIN) tablet 600 mg  600 mg Oral Q8H PRN Dagmar Hait, MD      .  LORazepam (ATIVAN) tablet 1 mg  1 mg Oral Q8H PRN Dagmar Hait, MD      . nicotine (NICODERM CQ - dosed in mg/24 hours) patch 21 mg  21 mg Transdermal Daily Dagmar Hait, MD      . ondansetron [facility]) tablet 4 mg  4 mg Oral Q8H PRN Dagmar Hait, MD      . zolpidem [facility]) tablet 5 mg  5 mg Oral QHS PRN Dagmar Hait, MD       No current outpatient prescriptions on file.    Psychiatric Specialty Exam:     Blood pressure 142/79, pulse 84, temperature 98.8 F (37.1 C), temperature source Oral, resp. rate 17, SpO2 95.00%.There is no weight on file to calculate BMI.  General Appearance: Disheveled  Eye Contact::  Good  Speech:  Blocked and Normal Rate  Volume:  Normal  Mood:  Depressed  Affect:  Depressed and Flat  Thought Process:  Circumstantial and Goal Directed  Orientation:  Full (Time, Place, and Person)  Thought Content:  " I want to feel better"  Suicidal Thoughts:  Yes.   without intent/plan  Homicidal Thoughts:  No  Memory:  Immediate;   Good Recent;   Good  Judgement:  Impaired  Insight:  Fair  Psychomotor Activity:  Tremor  Concentration:  Good  Recall:  Good  Akathisia:  No  Handed:  Right  AIMS (if indicated):     Assets:  Communication Skills Desire for Improvement Housing Social Support  Sleep:      Face to face interview and consult with Dr Tawni Carnes  Treatment Plan Summary: Daily contact with patient to assess and evaluate symptoms and progress in treatment Medication management  Disposition:  Inpatient treatment recommended.  Start Librium protocol and Neurontin 300 mg Bid.  Monitor for safety and stabilization until inpatient bed is found.     Rankin, ShuvonFNP-BC 03/05/2013 12:15 PM  Pt was seen with FNP. Agree with above assessment and plan. Awaiting transfer to inpatient facility.  Ancil Linsey, MD

## 2013-03-05 NOTE — Progress Notes (Signed)
D.  Pt is a 42 year old AAM admitted to the services of Dr. Dub Mikes for detox from ETOH.  Pt states that he has been drinking up to three twelve packs of beer daily.  Pt was very intoxicated upon initial assessment in ED.  Pt denies other drug use with the exception of occasional cannabis use.  Pt states that he has been told that he has spots on his liver and that his right side has been hurting and this caused him to bring himself in for help.  Pt has three daughters also as his inspiration.  He states he has a son also who is deceased.  Pt currently denying s/s of withdrawal other than mild tremors and sweats.

## 2013-03-05 NOTE — ED Notes (Addendum)
Called [facility] to give report. Shalita RN unavailable, will call me back.

## 2013-03-05 NOTE — BH Assessment (Signed)
Assessment Note  [name] is an 42 y.o. male.   Pt seeking assistance with depression and feelings of suicide and detox from alcohol.  Pt does not have a plan to harm self and denies hx of attempting suicide.  Pt can't identify stressor for onset of suicidal thought.  Pt just responded "I don't want to live."  Pt reports consuming 12 to a case of beer daily.  Pt also reports be sober for 2.5 years but can't remember when that was or when he relapsed.  Pt reports he has been in detox before but "can't remember where."    Pt made no eye contact with TTS, cooperative but irritable, Ox3,   Pt will be rounded on by psychiatry.  Pt denies HI and AVH but does report hx of AVH, but not current.  Pt denies being on meds or being involved with [facility].  Recommendation  Pt may be appropriate for inpatient services due to self harm thoughts.  Psychiatry will round on to determine criteria and appropriate treatment options.  Axis I: Major Depression, Recurrent severe and Substance Abuse Axis II: Deferred Axis III:  Past Medical History  Diagnosis Date  . Alcohol abuse   . Anemia   . Severe major depression with psychotic features   . Colitis    Axis IV: other psychosocial or environmental problems, problems related to social environment and problems with primary support group Axis V: 41-50 serious symptoms  Past Medical History:  Past Medical History  Diagnosis Date  . Alcohol abuse   . Anemia   . Severe major depression with psychotic features   . Colitis     History reviewed. No pertinent past surgical history.  Family History: History reviewed. No pertinent family history.  Social History:  reports that he has been smoking Cigarettes.  He has been smoking about 0.33 packs per day. He does not have any smokeless tobacco history on file. He reports that he drinks alcohol. He reports that he uses illicit drugs (Marijuana) about once per week.  Additional Social History:   Alcohol / Drug Use Pain Medications: na Prescriptions: na Over the Counter: na History of alcohol / drug use?: Yes Longest period of sobriety (when/how long): 2.5 years - doesn't remember when Substance #1 Name of Substance 1: alcohol 1 - Age of First Use: teen 1 - Amount (size/oz): 12 pk to a case of beer 1 - Frequency: daily 1 - Duration: years per pt 1 - Last Use / Amount: 03-04-13  CIWA: CIWA-Ar BP: 130/99 mmHg Pulse Rate: 87 Nausea and Vomiting: no nausea and no vomiting Tactile Disturbances: none Tremor: no tremor Auditory Disturbances: not present Paroxysmal Sweats: no sweat visible Visual Disturbances: very mild sensitivity Anxiety: mildly anxious Headache, Fullness in Head: very mild Agitation: somewhat more than normal activity Orientation and Clouding of Sensorium: oriented and can do serial additions CIWA-Ar Total: 4 COWS:    Allergies:  Allergies  Allergen Reactions  . Sertraline Other (See Comments)    Acute GI distress with diarrhea and pain.     Home Medications:  (Not in a hospital admission)  OB/GYN Status:  No LMP for male patient.  General Assessment Data Location of Assessment: [facility] Is this a Tele or Face-to-Face Assessment?: Face-to-Face Is this an Initial Assessment or a Re-assessment for this encounter?: Initial Assessment Living Arrangements: Non-relatives/Friends Can pt return to current living arrangement?: Yes Admission Status: Voluntary Is patient capable of signing voluntary admission?: Yes Transfer from: Acute Hospital Referral Source:  MD  Medical Screening Exam [facility] Walk-in ONLY) Medical Exam completed: Yes  [facility] Crisis Care Plan Living Arrangements: Non-relatives/Friends  Education Status Is patient currently in school?: No Current Grade: na Highest grade of school patient has completed: na Name of school: na Contact person: na  Risk to self Suicidal Ideation: Yes-Currently Present Suicidal Intent: No-Not  Currently/Within Last 6 Months Is patient at risk for suicide?: Yes (pt says he is) Suicidal Plan?: No-Not Currently/Within Last 6 Months Access to Means: Yes Specify Access to Suicidal Means: pt has access to drugs What has been your use of drugs/alcohol within the last 12 months?: alcohol Previous Attempts/Gestures: No How many times?: 0 Other Self Harm Risks: na Triggers for Past Attempts: None known Intentional Self Injurious Behavior: None Family Suicide History: No Recent stressful life event(s): Financial Problems Persecutory voices/beliefs?: No Depression: Yes Depression Symptoms: Isolating;Fatigue;Guilt;Loss of interest in usual pleasures;Feeling worthless/self pity;Feeling angry/irritable Substance abuse history and/or treatment for substance abuse?: Yes Suicide prevention information given to non-admitted patients: Not applicable  Risk to Others Homicidal Ideation: No Thoughts of Harm to Others: No Current Homicidal Intent: No Current Homicidal Plan: No Access to Homicidal Means: No Identified Victim: na History of harm to others?: No Assessment of Violence: None Noted Violent Behavior Description: cooperative but irritable Does patient have access to weapons?: No Criminal Charges Pending?: No Does patient have a court date: No  Psychosis Hallucinations: None noted (hx of but not current) Delusions: None noted (hx of but not current per pt)  Mental Status Report Appear/Hygiene: Disheveled Eye Contact: Poor Motor Activity: Unremarkable Speech: Soft;Logical/coherent Level of Consciousness: Irritable;Quiet/awake Mood: Depressed;Anxious;Irritable Affect: Anxious;Depressed;Irritable Anxiety Level: Minimal Thought Processes: Coherent Judgement: Impaired Orientation: Person;Place;Situation Obsessive Compulsive Thoughts/Behaviors: None  Cognitive Functioning Concentration: Decreased Memory: Recent Intact;Remote Intact (pt was evasive but did not appear related  to memory) IQ: Average Insight: Poor Impulse Control: Poor Appetite: Fair Weight Loss: 0 Weight Gain: 0 Sleep: Decreased Total Hours of Sleep: 4 Vegetative Symptoms: None  ADLScreening [facility] Assessment Services) Patient's cognitive ability adequate to safely complete daily activities?: Yes Patient able to express need for assistance with ADLs?: Yes Independently performs ADLs?: Yes (appropriate for developmental age)  Prior Inpatient Therapy Prior Inpatient Therapy: Yes Prior Therapy Dates: 2013 Prior Therapy Facilty/Provider(s): doesn't remember Reason for Treatment: detox  Prior Outpatient Therapy Prior Outpatient Therapy: No Prior Therapy Dates: na Prior Therapy Facilty/Provider(s): na Reason for Treatment: na  ADL Screening (condition at time of admission) Patient's cognitive ability adequate to safely complete daily activities?: Yes Is the patient deaf or have difficulty hearing?: No Does the patient have difficulty seeing, even when wearing glasses/contacts?: No Does the patient have difficulty concentrating, remembering, or making decisions?: Yes Patient able to express need for assistance with ADLs?: Yes Does the patient have difficulty dressing or bathing?: No Independently performs ADLs?: Yes (appropriate for developmental age) Does the patient have difficulty walking or climbing stairs?: No Weakness of Legs: None Weakness of Arms/Hands: None  Home Assistive Devices/Equipment Home Assistive Devices/Equipment: None  Therapy Consults (therapy consults require a physician order) PT Evaluation Needed: No OT Evalulation Needed: No SLP Evaluation Needed: No Abuse/Neglect Assessment (Assessment to be complete while patient is alone) Physical Abuse: Denies Verbal Abuse: Denies Sexual Abuse: Denies Exploitation of patient/patient's resources: Denies Self-Neglect: Denies Values / Beliefs Cultural Requests During Hospitalization: None Spiritual Requests During  Hospitalization: None Consults Spiritual Care Consult Needed: No Social Work Consult Needed: No Merchant navy officer (For Healthcare) Advance Directive: Patient does not have advance directive  Pre-existing out of facility DNR order (yellow form or pink MOST form): No    Additional Information 1:1 In Past 12 Months?: No CIRT Risk: No Elopement Risk: No Does patient have medical clearance?: Yes     Disposition:  Disposition Initial Assessment Completed for this Encounter: Yes Disposition of Patient: Referred to (psychiatry will round) Patient referred to: Other (Comment) (psychiatry will round)  On Site Evaluation by:   Reviewed with Physician:    Titus Mould, Eppie Gibson 03/05/2013 9:57 AM

## 2013-03-05 NOTE — ED Notes (Signed)
Bed: [facility] Expected date:  Expected time:  Means of arrival:  Comments: EMS/SI 

## 2013-03-05 NOTE — Tx Team (Signed)
Initial Interdisciplinary Treatment Plan  PATIENT STRENGTHS: (choose at least two) Average or above average intelligence Capable of independent living Motivation for treatment/growth  PATIENT STRESSORS: Health problems Substance abuse   PROBLEM LIST: Problem List/Patient Goals Date to be addressed Date deferred Reason deferred Estimated date of resolution  Substance abuse (ETOH) 03/05/13     Suicidal Ideation 03/05/13     Depression 03/05/13                                          DISCHARGE CRITERIA:  Improved stabilization in mood, thinking, and/or behavior Motivation to continue treatment in a less acute level of care Need for constant or close observation no longer present Withdrawal symptoms are absent or subacute and managed without 24-hour nursing intervention  PRELIMINARY DISCHARGE PLAN: Attend 12-step recovery group Return to previous living arrangement  PATIENT/FAMIILY INVOLVEMENT: This treatment plan has been presented to and reviewed with the patient, [name].  The patient and family have been given the opportunity to ask questions and make suggestions.  Juliann Pares 03/05/2013, 8:46 PM

## 2013-03-05 NOTE — ED Notes (Signed)
Report called to Jeffie Pollock at [facility]. [facility] transport notified of transportation needed.

## 2013-03-05 NOTE — ED Provider Notes (Signed)
CSN: [id number]     Arrival date & time 03/05/13  0152 History   First MD Initiated Contact with Patient 03/05/13 0203     Chief Complaint  Patient presents with  . Medical Clearance   (Consider location/radiation/quality/duration/timing/severity/associated sxs/prior Treatment) Patient is a 42 y.o. male presenting with drug/alcohol assessment. The history is provided by the patient.  Drug / Alcohol Assessment Similar prior episodes: yes   Severity:  Moderate Onset quality:  Gradual Timing:  Constant Progression:  Worsening Chronicity:  Chronic Suspected agents:  Alcohol Associated symptoms: suicidal ideation   Associated symptoms: no abdominal pain, no confusion, no shortness of breath, no vomiting and no weakness     Past Medical History  Diagnosis Date  . Alcohol abuse   . Anemia   . Severe major depression with psychotic features   . Colitis    History reviewed. No pertinent past surgical history. History reviewed. No pertinent family history. History  Substance Use Topics  . Smoking status: Current Every Day Smoker -- 0.33 packs/day    Types: Cigarettes  . Smokeless tobacco: Not on file  . Alcohol Use: 0.0 oz/week     Comment: heavy     Review of Systems  Constitutional: Negative for fever.  Respiratory: Negative for cough and shortness of breath.   Gastrointestinal: Negative for vomiting and abdominal pain.  Neurological: Negative for weakness.  Psychiatric/Behavioral: Positive for suicidal ideas. Negative for confusion.  All other systems reviewed and are negative.    Allergies  Sertraline  Home Medications  No current outpatient prescriptions on file. BP 134/91  Pulse 90  Temp(Src) 98.5 F (36.9 C) (Oral)  Resp 16  SpO2 97% Physical Exam  Constitutional: He is oriented to person, place, and time. He appears well-developed and well-nourished. No distress.  Intoxicated  HENT:  Head: Normocephalic and atraumatic.  Mouth/Throat: No oropharyngeal  exudate.  Eyes: EOM are normal. Pupils are equal, round, and reactive to light.  Neck: Normal range of motion. Neck supple.  Cardiovascular: Normal rate and regular rhythm.  Exam reveals no friction rub.   No murmur heard. Pulmonary/Chest: Effort normal and breath sounds normal. No respiratory distress. He has no wheezes. He has no rales.  Abdominal: He exhibits no distension. There is no tenderness. There is no rebound.  Musculoskeletal: Normal range of motion. He exhibits no edema.  Neurological: He is alert and oriented to person, place, and time.  Skin: He is not diaphoretic.    ED Course  Procedures (including critical care time) Labs Review Labs Reviewed  COMPREHENSIVE METABOLIC PANEL - Abnormal; Notable for the following:    Chloride 95 (*)    Total Protein 9.3 (*)    AST 76 (*)    All other components within normal limits  ETHANOL - Abnormal; Notable for the following:    Alcohol, Ethyl (B) 345 (*)    All other components within normal limits  SALICYLATE LEVEL - Abnormal; Notable for the following:    Salicylate Lvl <2.0 (*)    All other components within normal limits  ACETAMINOPHEN LEVEL  CBC  URINE RAPID DRUG SCREEN (HOSP PERFORMED)   Imaging Review No results found.  EKG Interpretation   None       MDM   1. Alcohol intoxication   2. Suicidal ideation    87M here with wanting to get help with his alcohol. No hx of depression, SI, or suicide attempt. States he is very sad and wants to die. No specific plan. Intoxicated here.  Will check labs, consult TTS.     Dagmar Hait, MD 03/05/13 [phone]

## 2013-03-05 NOTE — ED Notes (Signed)
Pt has two pt belongings bags which consist of: 1 coat, hoodie, jeans, sock, sneakers, tshirt, long sleeve shirt, belk, wallet

## 2013-03-05 NOTE — ED Notes (Signed)
Tried to call report to [facility]-unavailable.

## 2013-03-05 NOTE — ED Notes (Signed)
Pt arrived to the Ed with a need for medical clearance. Pt states he is addicted to alcohol and needs to stop drinking but needs assistance with that process.

## 2013-03-06 ENCOUNTER — Encounter (HOSPITAL_COMMUNITY): Payer: Self-pay | Admitting: Psychiatry

## 2013-03-06 DIAGNOSIS — F1994 Other psychoactive substance use, unspecified with psychoactive substance-induced mood disorder: Secondary | ICD-10-CM

## 2013-03-06 DIAGNOSIS — F1020 Alcohol dependence, uncomplicated: Principal | ICD-10-CM

## 2013-03-06 MED ORDER — TRAZODONE HCL 100 MG PO TABS: 100.0000 mg | ORAL_TABLET | Freq: Every evening | ORAL | Status: DC | PRN

## 2013-03-06 NOTE — [facility] Group Notes (Signed)
[facility] LCSW Aftercare Discharge Planning Group Note   03/06/2013 9:33 AM  Participation Quality:  Minimal   Mood/Affect:  Depressed and Flat  Depression Rating:  0  Anxiety Rating:  0  Thoughts of Suicide:  No Will you contract for safety?   NA  Current AVH:  No  Plan for Discharge/Comments:  Pt stated that he slept poorly last night. He rates withdrawals at an 8 today. Pt lived with "some friends" but does not plan to return to this environment at d/c. Pt interested in discussing i/p and o/p options with CSW. Pt reports he has been drinking heavily for several years-longest period of sobriety is 26 months.   Transportation Means: unknown at this time.   Supports: none identified   Smart, [employer]
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
## 2013-03-06 NOTE — Progress Notes (Addendum)
Patient ID: [name], male   DOB: [birth year], 43 y.o.   MRN: [id number]  D: Patient is pleasant with Clinical research associate but inquisitive about medications. Patient reports SI off and on and when asked if he can contract for safety patient states, "I don't know. I am okay right now." Patient states that he has pain on his right side but refuses pain relief interventions. Patient denies HI and A/V hallucinations. Patient took all scheduled medications except Gabapentin and Nicotine patch.  A: Emotional support and encouragement given to patient to speak with staff if patient is feeling suicidal or if he wants to act on his suicidal thoughts. Medications administered per physician's orders.   R: Patient is cooperative and is in the day room. Q15 minute safety checks are maintained. [facility] has been informed by Clinical research associate that this patient is to stay in the dayroom for safety.

## 2013-03-06 NOTE — Progress Notes (Signed)
Adult Psychoeducational Group Note  Date:  03/06/2013 Time:  3:46 PM  Group Topic/Focus:  Self Care:   The focus of this group is to help patients understand the importance of self-care in order to improve or restore emotional, physical, spiritual, interpersonal, and financial health.  Participation Level:  Did Not Attend  Additional Comments:  Pt was in bed asleep.  [name] 03/06/2013, 3:46 PM

## 2013-03-06 NOTE — [facility] Group Notes (Signed)
[facility] LCSW Group Therapy  [name]  Type of Therapy:  Group Therapy  Participation Level:  None  Participation Quality:  Drowsy  Affect:  Lethargic  Cognitive:  Lacking  Insight:  None  Engagement in Therapy:  None  Modes of Intervention:  Discussion, Education, Exploration, Socialization and Support  Summary of Progress/Problems: Today's Topic: Overcoming Obstacles. Pt identified obstacles faced currently and processed barriers involved in overcoming these obstacles. Pt identified steps necessary for overcoming these obstacles and explored motivation (internal and external) for facing these difficulties head on. Pt further identified one area of concern in their lives and chose a skill of focus pulled from their "toolbox." [name] was inattentive and disengaged throughout today's group. He quickly fell asleep and did not wake up until group terminated. [name] does not demonstrate progress in the group setting at this time. "medications are making me tired."    [name], [name], [name]
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
## 2013-03-07 DIAGNOSIS — F411 Generalized anxiety disorder: Secondary | ICD-10-CM

## 2013-03-07 NOTE — [facility] Group Notes (Signed)
[facility] LCSW Group Therapy  03/07/2013 3:22 PM  Type of Therapy:  Group Therapy  Participation Level:  Minimal  Participation Quality:  Drowsy  Affect:  Depressed and Lethargic  Cognitive:  Lacking  Insight:  Limited  Engagement in Therapy:  Limited  Modes of Intervention:  Discussion, Education, Socialization and Support  Summary of Progress/Problems: [facility] Speaker came to talk about his personal journey with substance abuse and addiction. Today's speaker did not show up to group. CSW provided pt with [facility] and [facility] information (handout) and reviewed this information in detail. [name] was inattentive and disengaged throughout group and did not follow along in handout as CSW reviewed this information. [name] presented with depressed mood and lethargic/irritable affect. He does not appear to be making progress in the group setting at this time.   Smart, [name] 03/07/2013, 3:22 PM

## 2013-03-07 NOTE — Progress Notes (Signed)
Adult Psychoeducational Group Note  Date:  03/07/2013 Time:  10:56 AM  Group Topic/Focus:  Therapeutic Activity  Participation Level:  Active  Participation Quality:  Appropriate and Attentive  Affect:  Appropriate  Cognitive:  Alert and Appropriate  Insight: Appropriate  Engagement in Group:  Engaged  Modes of Intervention:  Activity, Discussion, Education and Socialization  Additional Comments:  Pt was active and sharing during this therapeutic activity. Pt participated in playing "human bingo", which was a way for patients to get to know and interact with each other.   Malachy Moan 03/07/2013, 10:56 AM

## 2013-03-07 NOTE — [facility] Suicide Risk Assessment (Signed)
[facility] INPATIENT:  Family/Significant Other Suicide Prevention Education  Suicide Prevention Education:  Contact Attempts: Herman/Teresa Switzer (pt's father/stepmother) [phone] has been identified by the patient as the family member/significant other with whom the patient will be residing, and identified as the person(s) who will aid the patient in the event of a mental health crisis.  With written consent from the patient, two attempts were made to provide suicide prevention education, prior to and/or following the patient's discharge.  We were unsuccessful in providing suicide prevention education.  A suicide education pamphlet was given to the patient to share with family/significant other.  Date and time of first attempt: 03/07/13 10:00AM  Date and time of second attempt:03/07/13 (voicemail left)  May, [name] Manago 03/07/2013, 2:26 PM

## 2013-03-07 NOTE — Progress Notes (Signed)
D:Pt has a flat affect with minimal/cautious interaction. Pt refused scheduled Neurotin stating that it does not help. Explained that medication is used for nerve ending pain as well as anxiety and pt continues to refuse. Pt c/o loose stool reporting that he has had three throughout the day. Offered immodium and pt refused at this time. Pt states that he will alert staff for medication if he has another loose stool today.  A:Offered support, encouragement and 15 minute checks. R:Pt denies si and hi. Safety maintained on the unit.

## 2013-03-07 NOTE — Progress Notes (Signed)
[facility] MD Progress Note  03/07/2013 8:32 PM [name]  MRN:  [id number] Subjective:  [name] endorses that she is feeling better today. He states that he wants to go to the [address] to deal with his anxiety and depression.In the [name] states people ar just talking about killing themseves Diagnosis:   DSM5: Schizophrenia Disorders:  none Obsessive-Compulsive Disorders:  none Trauma-Stressor Disorders:  none Substance/Addictive Disorders:  Alcohol Related Disorder - Severe (303.90) Depressive Disorders:  Major Depressive Disorder - Severe (296.23)  Axis I: Anxiety Disorder NOS  ADL's:  Intact  Sleep: Fair  Appetite:  Fair  Suicidal Ideation:  Plan:  denies Intent:  denies Means:  denies Homicidal Ideation:  Plan:  denies Intent:  denies Means:  denies AEB (as evidenced by):  Psychiatric Specialty Exam: Review of Systems  Constitutional: Negative.   HENT: Negative.   Eyes: Negative.   Respiratory: Negative.   Cardiovascular: Negative.   Gastrointestinal: Negative.   Genitourinary: Negative.   Musculoskeletal: Positive for back pain.  Skin: Negative.   Neurological: Negative.   Endo/Heme/Allergies: Negative.   Psychiatric/Behavioral: Positive for depression and substance abuse. The patient is nervous/anxious.     Blood pressure 115/75, pulse 76, temperature 97.8 F (36.6 C), temperature source Oral, resp. rate 22, height 5\' 8"  (1.727 m), weight 58.514 kg (129 lb).Body mass index is 19.62 kg/(m^2).  General Appearance: Fairly Groomed  Patent attorney::  Fair  Speech:  Clear and Coherent and Slow  Volume:  Decreased  Mood:  Anxious and Depressed  Affect:  Restricted  Thought Process:  Coherent and Goal Directed  Orientation:  Full (Time, Place, and Person)  Thought Content:  worries, concerns about what he wants to get from groups  Suicidal Thoughts:  No  Homicidal Thoughts:  No  Memory:  Immediate;   Fair Recent;   Fair Remote;   Fair  Judgement:  Fair   Insight:  Present  Psychomotor Activity:  Restlessness  Concentration:  Fair  Recall:  Fair  Akathisia:  No  Handed:    AIMS (if indicated):     Assets:  Desire for Improvement  Sleep:  Number of Hours: 6.75   Current Medications: Current Facility-Administered Medications  Medication Dose Route Frequency Provider Last Rate Last Dose  . acetaminophen (TYLENOL) tablet 650 mg  650 mg Oral Q4H PRN Shuvon Rankin, NP      . alum & mag hydroxide-simeth (MAALOX/MYLANTA) 200-200-20 MG/5ML suspension 30 mL  30 mL Oral PRN Shuvon Rankin, NP      . chlordiazePOXIDE (LIBRIUM) capsule 25 mg  25 mg Oral Q6H PRN Shuvon Rankin, NP      . [START ON 03/08/2013] chlordiazePOXIDE (LIBRIUM) capsule 25 mg  25 mg Oral BH-qamhs Shuvon Rankin, NP       Followed by  . [START ON 03/09/2013] chlordiazePOXIDE (LIBRIUM) capsule 25 mg  25 mg Oral Daily Shuvon Rankin, NP      . gabapentin (NEURONTIN) capsule 300 mg  300 mg Oral TID Shuvon Rankin, NP   300 mg at 03/06/13 1204  . hydrOXYzine (ATARAX/VISTARIL) tablet 25 mg  25 mg Oral Q6H PRN Shuvon Rankin, NP      . ibuprofen (ADVIL,MOTRIN) tablet 600 mg  600 mg Oral Q8H PRN Shuvon Rankin, NP      . loperamide (IMODIUM) capsule 2-4 mg  2-4 mg Oral PRN Shuvon Rankin, NP      . magnesium hydroxide (MILK OF MAGNESIA) suspension 30 mL  30 mL Oral Daily PRN Shuvon Rankin, NP      .  multivitamin with minerals tablet 1 tablet  1 tablet Oral Daily Shuvon Rankin, NP   1 tablet at 03/07/13 0817  . nicotine (NICODERM CQ - dosed in mg/24 hours) patch 21 mg  21 mg Transdermal Daily Shuvon Rankin, NP      . ondansetron (ZOFRAN) tablet 4 mg  4 mg Oral Q8H PRN Shuvon Rankin, NP      . ondansetron (ZOFRAN-ODT) disintegrating tablet 4 mg  4 mg Oral Q6H PRN Shuvon Rankin, NP      . thiamine (B-1) injection 100 mg  100 mg Intramuscular Daily Shuvon Rankin, NP      . thiamine (VITAMIN B-1) tablet 100 mg  100 mg Oral Daily Shuvon Rankin, NP   100 mg at 03/07/13 1610    Lab Results: No  results found for this or any previous visit (from the past 48 hour(s)).  Physical Findings: AIMS: Facial and Oral Movements Muscles of Facial Expression: None, normal Lips and Perioral Area: None, normal Jaw: None, normal Tongue: None, normal,Extremity Movements Upper (arms, wrists, hands, fingers): None, normal Lower (legs, knees, ankles, toes): None, normal, Trunk Movements Neck, shoulders, hips: None, normal, Overall Severity Severity of abnormal movements (highest score from questions above): None, normal Incapacitation due to abnormal movements: None, normal Patient's awareness of abnormal movements (rate only patient's report): No Awareness, Dental Status Current problems with teeth and/or dentures?: No Does patient usually wear dentures?: No  CIWA:  CIWA-Ar Total: 2 COWS:     Treatment Plan Summary: Daily contact with patient to assess and evaluate symptoms and progress in treatment Medication management  Plan: Supportive approach/coping skills/relapse prevention           Reassess and address the co morbdities           Pursue the Librium detox Medical Decision Making Problem Points:  Review of psycho-social stressors (1) Data Points:  Review of medication regiment & side effects (2)  I certify that inpatient services furnished can reasonably be expected to improve the patient's condition.   Boyce Keltner A 03/07/2013, 8:32 PM

## 2013-03-07 NOTE — Progress Notes (Signed)
D   Pt is guarded and sad   He denies suicidal ideation presently but does have off and on thoughts of suicide    He is visible on the milue but keeps to himself with minimal interactions   He did not request medications for sleep or pain at bedtime A   Verbal support given   Q 15 min checks R   Pt safe at present

## 2013-03-07 NOTE — Progress Notes (Signed)
Recreation Therapy Notes  Date: 11.04.2014 Time: 2:30pm Location: [address] Dayroom  Group Topic: Animal Assisted Activities (AAA)  Behavioral Response: Engaged, Attentive   Affect: Euthymic  Clinical Observations/Feedback: Dog Team: Roberts & handler. Patient interacted appropriately with peer, dog team, and LRT.   Briggitte Boline L Bekki Tavenner, LRT/CTRS  Ansley Mangiapane L 03/07/2013 4:54 PM 
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
## 2013-03-07 NOTE — Progress Notes (Signed)
Attended group on [address]

## 2013-03-07 NOTE — Progress Notes (Signed)
Patient ID: [name], male   DOB: 1970/07/25, 42 y.o.   MRN: [id number] He has been up for short periods of time and when he is in the day room he lays covered up. He says that he was not getting anything out of the groups on the 300 hall   And was told by Phy. That he could go to the 500 hall groups. Stated; that his w/d symptoms were slight tremors and slight agitation when around peers.

## 2013-03-08 MED ORDER — GABAPENTIN 300 MG PO CAPS: 300.0000 mg | ORAL_CAPSULE | Freq: Three times a day (TID) | ORAL | Status: DC

## 2013-03-08 NOTE — Progress Notes (Signed)
Patient ID: [name], male   DOB: January 17, 1971, 42 y.o.   MRN: [id number] He has been up and to groups on the 500 hall. He continues to say that he has w/d  Symptoms then will not say what they are.  He continues to keep to himself. Denies SI thoughts and discomfort. Will continue to encourage him to attend groups.

## 2013-03-08 NOTE — [facility] Group Notes (Signed)
[facility] LCSW Aftercare Discharge Planning Group Note   03/08/2013 10:37 AM  Participation Quality:  Minimal   Mood/Affect:  Blunted  Depression Rating:  7  Anxiety Rating:  5  Thoughts of Suicide:  No Will you contract for safety?   NA  Current AVH:  No  Plan for Discharge/Comments:  Pt is willing to go to [facility] for screening/admission on 03/09/13 Thursday. Pt to follow up at [facility] for med management and will be provided with bus pass.   Transportation Means: bus   Supports: stepmother and father  Counselling psychologist, Herbert Seta

## 2013-03-08 NOTE — [facility] Suicide Risk Assessment (Signed)
Suicide Risk Assessment  Discharge Assessment     Demographic Factors:  Male  Mental Status Per Nursing Assessment::   On Admission:  Suicidal ideation indicated by patient  Current Mental Status by Physician: In full contact with reality. There are no suicidal ideas, plans or intent. His mood is worried, affect is appropriate. There are no S/S of active withdrawal. He is motivated to pursue treatment at [facility]   Loss Factors: NA  Historical Factors: NA  Risk Reduction Factors:   wants to get his life back together  Continued Clinical Symptoms:  Depression:   Comorbid alcohol abuse/dependence Alcohol/Substance Abuse/Dependencies  Cognitive Features That Contribute To Risk:  Closed-mindedness Polarized thinking Thought constriction (tunnel vision)    Suicide Risk:  Minimal: No identifiable suicidal ideation.  Patients presenting with no risk factors but with morbid ruminations; may be classified as minimal risk based on the severity of the depressive symptoms  Discharge Diagnoses:   AXIS I:  Alcohol Dependence, Polysubstance Abuse, Depressive Disorder NOS AXIS II:  Deferred AXIS III:   Past Medical History  Diagnosis Date  . Alcohol abuse   . Anemia   . Severe major depression with psychotic features   . Colitis    AXIS IV:  other psychosocial or environmental problems AXIS V:  51-60 moderate symptoms  Plan Of Care/Follow-up recommendations:  Activity:  as tolerated Diet:  regular Follow up [facility] Is patient on multiple antipsychotic therapies at discharge:  No   Has Patient had three or more failed trials of antipsychotic monotherapy by history:  No  Recommended Plan for Multiple Antipsychotic Therapies: NA  Murna Backer A 03/08/2013, 3:47 PM

## 2013-03-08 NOTE — Progress Notes (Signed)
Adult Psychoeducational Group Note  Date:  03/08/2013 Time:  10:50 PM  Group Topic/Focus:  Wrap-Up Group:   The focus of this group is to help patients review their daily goal of treatment and discuss progress on daily workbooks.  Participation Level:  Active  Participation Quality:  Appropriate and Attentive  Affect:  Appropriate  Cognitive:  Alert and Appropriate  Insight: Appropriate  Engagement in Group:  Engaged  Modes of Intervention:  Discussion and Education  Additional Comments:  Pt was present and active during this group. Pt rated his day at a 9 and felt better about the groups he had been to on the 500 hall compared to the 300 hall. Pt said his overall goal for today was to be more positive and he felt like he achieved that goal.   Malachy Moan 03/08/2013, 10:50 PM

## 2013-03-08 NOTE — Progress Notes (Signed)
[name] MD Progress Note  03/08/2013 3:37 PM [name]  MRN:  [id number] Subjective:  [name] is still trying to figure things out for himself. He is going to be at [facility] in the morning. He is wanting to try to stay there for the 90 days.  Diagnosis:   DSM5: Schizophrenia Disorders:  none Obsessive-Compulsive Disorders:  none Trauma-Stressor Disorders:  none Substance/Addictive Disorders:  Alcohol Related Disorder - Severe (303.90) Depressive Disorders:  Major Depressive Disorder - Severe (296.23)  Axis I: Substance Induced Mood Disorder  ADL's:  Intact  Sleep: Fair  Appetite:  Fair  Suicidal Ideation:  Plan:  denies Intent:  denies Means:  denies Homicidal Ideation:  Plan:  denies Intent:  denies Means:  denies AEB (as evidenced by):  Psychiatric Specialty Exam: Review of Systems  Constitutional: Negative.   HENT: Negative.   Eyes: Negative.   Respiratory: Negative.   Cardiovascular: Negative.   Gastrointestinal: Negative.   Genitourinary: Negative.   Musculoskeletal: Positive for back pain.  Skin: Negative.   Neurological: Negative.   Endo/Heme/Allergies: Negative.   Psychiatric/Behavioral: Positive for depression and substance abuse. The patient is nervous/anxious.     Blood pressure 144/99, pulse 82, temperature 97.8 F (36.6 C), temperature source Oral, resp. rate 18, height 5\' 8"  (1.727 m), weight 58.514 kg (129 lb).Body mass index is 19.62 kg/(m^2).  General Appearance: Fairly Groomed  Patent attorney::  Fair  Speech:  Clear and Coherent and Slow  Volume:  Decreased  Mood:  worried  Affect:  Restricted  Thought Process:  Coherent and Goal Directed  Orientation:  Other:  person, place  Thought Content:  worries, concerns  Suicidal Thoughts:  No  Homicidal Thoughts:  No  Memory:  Immediate;   Fair Recent;   Fair Remote;   Fair  Judgement:  Fair  Insight:  Shallow  Psychomotor Activity:  Decreased  Concentration:  Poor  Recall:  Fair  Akathisia:   No  Handed:    AIMS (if indicated):     Assets:  Desire for Improvement  Sleep:  Number of Hours: 6.5   Current Medications: Current Facility-Administered Medications  Medication Dose Route Frequency Provider Last Rate Last Dose  . acetaminophen (TYLENOL) tablet 650 mg  650 mg Oral Q4H PRN Shuvon Rankin, NP      . alum & mag hydroxide-simeth (MAALOX/MYLANTA) 200-200-20 MG/5ML suspension 30 mL  30 mL Oral PRN Shuvon Rankin, NP      . chlordiazePOXIDE (LIBRIUM) capsule 25 mg  25 mg Oral Q6H PRN Shuvon Rankin, NP      . chlordiazePOXIDE (LIBRIUM) capsule 25 mg  25 mg Oral BH-qamhs Shuvon Rankin, NP   25 mg at 03/08/13 0853   Followed by  . [START ON 03/09/2013] chlordiazePOXIDE (LIBRIUM) capsule 25 mg  25 mg Oral Daily Shuvon Rankin, NP      . gabapentin (NEURONTIN) capsule 300 mg  300 mg Oral TID Shuvon Rankin, NP   300 mg at 03/06/13 1204  . hydrOXYzine (ATARAX/VISTARIL) tablet 25 mg  25 mg Oral Q6H PRN Shuvon Rankin, NP      . ibuprofen (ADVIL,MOTRIN) tablet 600 mg  600 mg Oral Q8H PRN Shuvon Rankin, NP      . loperamide (IMODIUM) capsule 2-4 mg  2-4 mg Oral PRN Shuvon Rankin, NP      . magnesium hydroxide (MILK OF MAGNESIA) suspension 30 mL  30 mL Oral Daily PRN Shuvon Rankin, NP      . multivitamin with minerals tablet 1 tablet  1 tablet  Oral Daily Shuvon Rankin, NP   1 tablet at 03/08/13 0800  . nicotine (NICODERM CQ - dosed in mg/24 hours) patch 21 mg  21 mg Transdermal Daily Shuvon Rankin, NP      . ondansetron (ZOFRAN) tablet 4 mg  4 mg Oral Q8H PRN Shuvon Rankin, NP      . ondansetron (ZOFRAN-ODT) disintegrating tablet 4 mg  4 mg Oral Q6H PRN Shuvon Rankin, NP      . thiamine (B-1) injection 100 mg  100 mg Intramuscular Daily Shuvon Rankin, NP      . thiamine (VITAMIN B-1) tablet 100 mg  100 mg Oral Daily Shuvon Rankin, NP   100 mg at 03/08/13 0800    Lab Results:  Results for orders placed during the hospital encounter of 03/05/13 (from the past 48 hour(s))  GLUCOSE, CAPILLARY      Status: Abnormal   Collection Time    03/07/13  9:43 PM      Result Value Range   Glucose-Capillary 123 (*) 70 - 99 mg/dL    Physical Findings: AIMS: Facial and Oral Movements Muscles of Facial Expression: None, normal Lips and Perioral Area: None, normal Jaw: None, normal Tongue: None, normal,Extremity Movements Upper (arms, wrists, hands, fingers): None, normal Lower (legs, knees, ankles, toes): None, normal, Trunk Movements Neck, shoulders, hips: None, normal, Overall Severity Severity of abnormal movements (highest score from questions above): None, normal Incapacitation due to abnormal movements: None, normal Patient's awareness of abnormal movements (rate only patient's report): No Awareness, Dental Status Current problems with teeth and/or dentures?: No Does patient usually wear dentures?: No  CIWA:  CIWA-Ar Total: 0 COWS:     Treatment Plan Summary: Daily contact with patient to assess and evaluate symptoms and progress in treatment Medication management  Plan: Supportive approach/coping skills/elapse prevention           To be admitted to [facility] in the morning  Medical Decision Making Problem Points:  Review of psycho-social stressors (1) Data Points:  Review of medication regiment & side effects (2)  I certify that inpatient services furnished can reasonably be expected to improve the patient's condition.   [name] 03/08/2013, 3:37 PM

## 2013-03-08 NOTE — [facility] Group Notes (Signed)
[facility] LCSW Group Therapy  03/08/2013 2:22 PM  Type of Therapy:  Group Therapy  Participation Level:  Minimal  Participation Quality:  Drowsy  Affect:  Irritable and Lethargic  Cognitive:  Lacking  Insight:  Lacking  Engagement in Therapy:  Lacking  Modes of Intervention:  Discussion, Education, Exploration, Socialization and Support  Summary of Progress/Problems: Emotion Regulation: This group focused on both positive and negative emotion identification and allowed group members to process ways to identify feelings, regulate negative emotions, and find healthy ways to manage internal/external emotions. Group members were asked to reflect on a time when their reaction to an emotion led to a negative outcome and explored how alternative responses using emotion regulation would have benefited them. Group members were also asked to discuss a time when emotion regulation was utilized when a negative emotion was experienced. Kacyn was disengaged and inattentive throughout today's therapy group. Akia asked several questions about his transportation to [facility] in the AM and presented with reluctantance toward aftercare plan. When confronted about this, Hilberto stated that he would like to go straight into facility without going home but wanted to go home to get his clothes. Pt encouraged to make a pros cons list and think critically about his choices. Rolando fell asleep quickly and did not participate in group discussion about emotion regulation.    Smart, Willye Javier 03/08/2013, 2:22 PM

## 2013-03-08 NOTE — Progress Notes (Signed)
Patient ID: [name], male   DOB: [birth year], 42 y.o.   MRN: [id number] Pt did not attend the 11:00 am psycho-education group.

## 2013-03-08 NOTE — Progress Notes (Signed)
[facility] Adult Case Management Discharge Plan :  Will you be returning to the same living situation after discharge: No. [facility] screening and possible admission on 11/6 At discharge, do you have transportation home?:Yes,  bus  Do you have the ability to pay for your medications:Yes,  mental health  Release of information consent forms completed and in the chart;  Patient's signature needed at discharge.  Patient to Follow up at: Follow-up Information   Follow up with [facility]. (Walk in between 8am-9am Monday through Friday for hospital follow up/medication management/assessment for therapy services. )    Contact information:   [address], [place] [address] Phone: [phone] Fax: [phone]      Follow up with [facility] On 03/09/2013. (Screening/admission if accepted on this date. Please be sure to arrive by 8AM at [facility] on [facility]. [facility] aware that you will be waiting there for transport to the facility. Call [phone] when you arrive at [facility].)    Contact information:   [address] [place], [place] [address] Phone: [phone] Fax: [phone]      Patient denies SI/HI:   Yes,  during group/self report.    Safety Planning and Suicide Prevention discussed:  Yes,  Contact attempts made with pt's father/stepmother. SPE completed with pt. Pt given SPI pamphlet and encouraged to share with support network.   Smart, Hajar Penninger 03/08/2013, 11:23 AM

## 2013-03-08 NOTE — Progress Notes (Signed)
Patient ID: [name], male   DOB: 03/23/1971, 42 y.o.   MRN: [id number] D  --  PT. DENIES PAIN  .Marland Kitchen  HE IS APP/COOP BUT DECLINED TO TAKE HIS 1700 HR. DOSE OF 300 MG PO OF NEURONTIN, SAYING  " I ALREADY TOLD THEM I DID NOT WANT THAT MEDICINE "   A  --   OFFER MED AS ORDERED.   R  ---  PT. DECLINED THE MEDICATION EVEN AFTER ENCOURAGEMENT TO TAKE.

## 2013-03-08 NOTE — Progress Notes (Signed)
Recreation Therapy Notes  Date: 11.04.2014 Time: 3:00pm Location: [address] Dayroom  Group Topic: Communication, Team Building, Problem Solving  Goal Area(s) Addresses:  Patient will effectively work with peer towards shared goal.  Patient will identify skill used to make activity successful.  Patient will identify how skills used during activity can be used to reach post d/c goals.   Behavioral Response: Engaged, Attentive, Drowsy  Intervention: Problem Solving Activitiy  Activity: Life Boat. Patients were given a scenario about being on a sinking yacht. Patients were informed the yacht included 15 guest, 8 of which could be placed on the life boat, along with all group members. Individuals on guest list were of varying socioeconomic classes such as a Education officer, museum, Materials engineer, Midwife, Tree surgeon.   Education: Customer service manager, Discharge Planning   Education Outcome: Acknowledges understanding  Clinical Observations/Feedback: Patient actively engaged in group activity voicing his opinion and debating with peers appropriately. Patient identified qualities that guided his decision making process, as well as agreeing with qualities identified with peers. Patient made no additional contributions to gropu discussion, and it is unclear if patient was actively listening as he laid down on a sofa in the dayroom and appeared to sleep.   Marykay Lex Melodee Lupe, LRT/CTRS  Chardae Mulkern L 03/08/2013 10:24 AM

## 2013-03-09 LAB — HEMOGLOBIN A1C
Hgb A1c MFr Bld: 5.3 % (ref <5.7)
Mean Plasma Glucose: 105 mg/dL (ref <117)

## 2013-03-09 NOTE — Progress Notes (Signed)
Patient ID: [name], male   DOB: May 25, 1970, 42 y.o.   MRN: [id number] D: Patient appears calm and cooperative, denies SI/HI  at this time.   A: All personal items in locker #49 returned to patient.  Patient  provided with discharge instructions and verbalized understanding.  R: Patient states he will comply with OP services and medications as prescribed. Patient escorted to lobby.

## 2013-03-09 NOTE — Progress Notes (Signed)
Patient ID: [name], male   DOB: April 03, 1971, 42 y.o.   MRN: [id number] D: Took over patient's care @ 2330. Patient in bed sleeping. Respiration regular and unlabored. No sign of distress noted at this time A: 15 mins checks for safety. R: Patient is safe.

## 2013-03-13 NOTE — Progress Notes (Signed)
Patient Discharge Instructions:  After Visit Summary (AVS):   Faxed to:  03/13/13 Psychiatric Admission Assessment Note:   Faxed to:  03/13/13 Suicide Risk Assessment - Discharge Assessment:   Faxed to:  03/13/13 Faxed/Sent to the Next Level Care provider:  03/13/13 Faxed to [facility] @ [phone] Faxed to [facility] @ [phone]  Jerelene Redden, 03/13/2013, 3:42 PM

## 2013-03-16 NOTE — Discharge Summary (Signed)
Physician Discharge Summary Note  Patient:  [name] is an 42 y.o., male MRN:  [id number] DOB:  Apr 04, 1971 Patient phone:  [phone] (home)  Patient address:   [address] [place] [address],   Date of Admission:  03/05/2013 Date of Discharge: 03/08/2013  Reason for Admission:  42 Y/O male who came to the ED requsting help with his alcoholism. Stated he was in pain, suicidal. Denied hearing voices that he has endorsed in the past. Very vague in terms of his use.  Discharge Diagnoses: Active Problems:   * No active hospital problems. *  Review of Systems  Constitutional: Negative.   HENT: Negative.   Eyes: Negative.   Respiratory: Negative.   Cardiovascular: Negative.   Gastrointestinal: Negative.   Genitourinary: Negative.   Musculoskeletal: Negative.   Skin: Negative.   Neurological: Negative.   Endo/Heme/Allergies: Negative.   Psychiatric/Behavioral: Positive for substance abuse. The patient is nervous/anxious.     DSM5:  Schizophrenia Disorders:  none Obsessive-Compulsive Disorders:  none Trauma-Stressor Disorders:  none Substance/Addictive Disorders:  Alcohol Related Disorder - Severe (303.90) Depressive Disorders:  Major Depressive Disorder - Moderate (296.22)  Axis Diagnosis:   AXIS I:  Substance Abuse and Substance Induced Mood Disorder AXIS II:  Deferred AXIS III:   Past Medical History  Diagnosis Date  . Alcohol abuse   . Anemia   . Severe major depression with psychotic features   . Colitis    AXIS IV:  other psychosocial or environmental problems AXIS V:  51-60 moderate symptoms  Level of Care:  [facility]  Hospital Course: He was admitted and started in individual and group psychotherapy. He was detox with Librium. He did not want to use any psychotropics as stated he used Risperdal in the past just for voices and he was not hearing any.He seemed to have had a negative response to Zoloft He was wanting to program in the [address] as stated  that in the [address] [place]   people were talking about being down, suicidal, using drugs, and he did not have to hear any of that. He was very concrete in his processing information. The detox went uneventfully and he was given a bed at [facility]. Upon D/C no S/S of withdrawal, mood euthymic, affect restricted. Denied any suicidal ideas, plans or intent. Was going to be admitted to [facility] in the morning  Consults:  None  Significant Diagnostic Studies:  As per the Chart  Discharge Vitals:   Blood pressure 119/78, pulse 80, temperature 97.8 F (36.6 C), temperature source Oral, resp. rate 18, height 5\' 8"  (1.727 m), weight 58.514 kg (129 lb). Body mass index is 19.62 kg/(m^2). Lab Results:   No results found for this or any previous visit (from the past 72 hour(s)).  Physical Findings: AIMS: Facial and Oral Movements Muscles of Facial Expression: None, normal Lips and Perioral Area: None, normal Jaw: None, normal Tongue: None, normal,Extremity Movements Upper (arms, wrists, hands, fingers): None, normal Lower (legs, knees, ankles, toes): None, normal, Trunk Movements Neck, shoulders, hips: None, normal, Overall Severity Severity of abnormal movements (highest score from questions above): None, normal Incapacitation due to abnormal movements: None, normal Patient's awareness of abnormal movements (rate only patient's report): No Awareness, Dental Status Current problems with teeth and/or dentures?: No Does patient usually wear dentures?: No  CIWA:  CIWA-Ar Total: 0 COWS:     Psychiatric Specialty Exam: See Psychiatric Specialty Exam and Suicide Risk Assessment completed by Attending Physician prior to discharge.  Discharge destination:  [facility]  Is patient on multiple antipsychotic therapies at discharge:  No   Has Patient had three or more failed trials of antipsychotic monotherapy by history:  No  Recommended Plan for Multiple Antipsychotic Therapies: NA     Medication  List       Indication   gabapentin 300 MG capsule  Commonly known as:  NEURONTIN  Take 1 capsule (300 mg total) by mouth 3 (three) times daily.   Indication:  Agitation, Alcohol Withdrawal Syndrome           Follow-up Information   Follow up with [facility]. (Walk in between 8am-9am Monday through Friday for hospital follow up/medication management/assessment for therapy services. )    Contact information:   [address], [place] [address] Phone: [phone] Fax: [phone]      Follow up with [facility] On 03/09/2013. (Screening/admission if accepted on this date. Please be sure to arrive by 8AM at [facility] on [facility]. [facility] aware that you will be waiting there for transport to the facility. Call [phone] when you arrive at [facility].)    Contact information:   [address] [place], [place] [address] Phone: [phone] Fax: [phone]      Follow-up recommendations:  Activity:  as tolerated Diet:  regular  Comments:  Continue to work your relapse prevention program  Total Discharge Time:  Greater than 30 minutes.  Signed: Jaydin Boniface A 03/16/2013, 7:28 AM

## 2013-03-30 ENCOUNTER — Emergency Department (HOSPITAL_BASED_OUTPATIENT_CLINIC_OR_DEPARTMENT_OTHER): Payer: Self-pay

## 2013-03-30 ENCOUNTER — Emergency Department (HOSPITAL_BASED_OUTPATIENT_CLINIC_OR_DEPARTMENT_OTHER)
Admission: EM | Admit: 2013-03-30 | Discharge: 2013-03-30 | Disposition: A | Discharge status: Home / Self Care | Payer: Self-pay | Attending: Emergency Medicine | Admitting: Emergency Medicine

## 2013-03-30 DIAGNOSIS — Z8719 Personal history of other diseases of the digestive system: Secondary | ICD-10-CM | POA: Insufficient documentation

## 2013-03-30 DIAGNOSIS — Z79899 Other long term (current) drug therapy: Secondary | ICD-10-CM | POA: Insufficient documentation

## 2013-03-30 DIAGNOSIS — F17200 Nicotine dependence, unspecified, uncomplicated: Secondary | ICD-10-CM | POA: Insufficient documentation

## 2013-03-30 DIAGNOSIS — G8929 Other chronic pain: Secondary | ICD-10-CM | POA: Insufficient documentation

## 2013-03-30 DIAGNOSIS — F1020 Alcohol dependence, uncomplicated: Secondary | ICD-10-CM | POA: Insufficient documentation

## 2013-03-30 DIAGNOSIS — R079 Chest pain, unspecified: Secondary | ICD-10-CM | POA: Insufficient documentation

## 2013-03-30 DIAGNOSIS — R109 Unspecified abdominal pain: Secondary | ICD-10-CM | POA: Insufficient documentation

## 2013-03-30 DIAGNOSIS — G43909 Migraine, unspecified, not intractable, without status migrainosus: Secondary | ICD-10-CM | POA: Insufficient documentation

## 2013-03-30 DIAGNOSIS — F323 Major depressive disorder, single episode, severe with psychotic features: Secondary | ICD-10-CM | POA: Insufficient documentation

## 2013-03-30 DIAGNOSIS — M549 Dorsalgia, unspecified: Secondary | ICD-10-CM | POA: Insufficient documentation

## 2013-03-30 DIAGNOSIS — Z862 Personal history of diseases of the blood and blood-forming organs and certain disorders involving the immune mechanism: Secondary | ICD-10-CM | POA: Insufficient documentation

## 2013-03-30 LAB — COMPREHENSIVE METABOLIC PANEL
Alkaline Phosphatase: 35 U/L — ABNORMAL LOW (ref 39–117)
BUN: 6 mg/dL (ref 6–23)
Chloride: 102 mEq/L (ref 96–112)
GFR calc Af Amer: >90 mL/min (ref >90)
Glucose, Bld: 81 mg/dL (ref 70–99)
Potassium: 3.7 mEq/L (ref 3.5–5.1)
Total Bilirubin: 0.3 mg/dL (ref 0.3–1.2)

## 2013-03-30 LAB — CBC WITH DIFFERENTIAL/PLATELET
Basophils Absolute: 0.1 10*3/uL (ref 0.0–0.1)
Hemoglobin: 12.8 g/dL — ABNORMAL LOW (ref 13.0–17.0)
Lymphocytes Relative: 31 % (ref 12–46)
Lymphs Abs: 1.9 10*3/uL (ref 0.7–4.0)
Monocytes Relative: 12 % (ref 3–12)
Neutro Abs: 3.2 10*3/uL (ref 1.7–7.7)
Neutrophils Relative %: 52 % (ref 43–77)
RBC: 4.03 MIL/uL — ABNORMAL LOW (ref 4.22–5.81)
RDW: 12 % (ref 11.5–15.5)
WBC: 6.2 10*3/uL (ref 4.0–10.5)

## 2013-03-30 LAB — LIPASE, BLOOD: Lipase: 41 U/L (ref 11–59)

## 2013-03-30 MED ORDER — DEXAMETHASONE SODIUM PHOSPHATE 10 MG/ML IJ SOLN
10.0000 mg | Freq: Once | INTRAMUSCULAR | Status: AC
  Administered 2013-03-30: 10 mg via INTRAVENOUS
  Filled 2013-03-30: qty 1

## 2013-03-30 MED ORDER — KETOROLAC TROMETHAMINE 30 MG/ML IJ SOLN
30.0000 mg | Freq: Once | INTRAMUSCULAR | Status: AC
  Administered 2013-03-30: 30 mg via INTRAVENOUS
  Filled 2013-03-30: qty 1

## 2013-03-30 MED ORDER — DIPHENHYDRAMINE HCL 50 MG/ML IJ SOLN
25.0000 mg | Freq: Once | INTRAMUSCULAR | Status: AC
  Administered 2013-03-30: 25 mg via INTRAVENOUS
  Filled 2013-03-30: qty 1

## 2013-03-30 MED ORDER — METOCLOPRAMIDE HCL 5 MG/ML IJ SOLN
10.0000 mg | Freq: Once | INTRAMUSCULAR | Status: AC
  Administered 2013-03-30: 10 mg via INTRAVENOUS
  Filled 2013-03-30: qty 2

## 2013-03-30 NOTE — ED Provider Notes (Signed)
CSN: [id number]     Arrival date & time 03/30/13  9562 History   First MD Initiated Contact with Patient 03/30/13 [phone]     Chief Complaint  Patient presents with  . Migraine   (Consider location/radiation/quality/duration/timing/severity/associated sxs/prior Treatment) HPI This 42 year old alcoholic with a history of elevated liver enzymes in the past, chronic abdominal pain, chronic back pain, currently at [facility] recovery for his alcoholism has been sober for almost one month now, is not suicidal or homicidal or hallucinating, presents with 2-3 days of gradual onset diffuse throbbing headache gradually worsening over the last few days with occasional lightheadedness but no definite vertigo, he has no change in speech vision swallowing or understanding and has no focal or lateralizing weakness numbness or incoordination, he is able to walk unassisted, he also has a 2-3 month history of constant almost 24-hour a day right-sided chest and abdominal pain vague, nonpleuritic, nonexertional, non-positional, without associated fever cough shortness of breath, and no treatment prior to arrival, he is PERC negative. His headache is gradually become severe over the last couple days. He has had stable chronic severe back and abdominal pain 24 hours a day for years unchanged recently. He has not had vomiting or bloody stools. Past Medical History  Diagnosis Date  . Alcohol abuse   . Anemia   . Severe major depression with psychotic features   . Colitis    No past surgical history on file. No family history on file. History  Substance Use Topics  . Smoking status: Current Every Day Smoker -- 0.33 packs/day    Types: Cigarettes  . Smokeless tobacco: Not on file  . Alcohol Use: 0.0 oz/week     Comment: heavy     Review of Systems 10 Systems reviewed and are negative for acute change except as noted in the HPI. Allergies  Sertraline  Home Medications   Current Outpatient Rx  Name  Route  Sig   Dispense  Refill  . gabapentin (NEURONTIN) 300 MG capsule   Oral   Take 1 capsule (300 mg total) by mouth 3 (three) times daily.   90 capsule   0    BP 116/80  Pulse 71  Temp(Src) 98.4 F (36.9 C) (Oral)  Resp 16  Wt 145 lb (65.772 kg)  SpO2 98% Physical Exam  Nursing note and vitals reviewed. Constitutional:  Awake, alert, nontoxic appearance with baseline speech for patient.  HENT:  Head: Atraumatic.  Mouth/Throat: No oropharyngeal exudate.  Eyes: EOM are normal. Pupils are equal, round, and reactive to light. Right eye exhibits no discharge. Left eye exhibits no discharge.  No nystagmus and negative test of skew  Neck: Neck supple.  Cardiovascular: Normal rate and regular rhythm.   No murmur heard. Pulmonary/Chest: Effort normal and breath sounds normal. No stridor. No respiratory distress. He has no wheezes. He has no rales. He exhibits no tenderness.  Abdominal: Soft. Bowel sounds are normal. He exhibits no mass. There is no tenderness. There is no rebound.  Musculoskeletal: He exhibits no tenderness.  Baseline ROM, moves extremities with no obvious new focal weakness.  Lymphadenopathy:    He has no cervical adenopathy.  Neurological: He is alert.  Awake, alert, cooperative and aware of situation; motor strength bilaterally; sensation normal to light touch bilaterally; peripheral visual fields full to confrontation; no facial asymmetry; tongue midline; major cranial nerves appear intact; no pronator drift, normal finger to nose bilaterally, baseline gait without new ataxia.  Skin: No rash noted.  Psychiatric: He has  a normal mood and affect.    ED Course  Procedures (including critical care time) Pt feels improved after observation and/or treatment in ED.Patient informed of clinical course, understand medical decision-making process, and agree with plan. Labs Review Labs Reviewed  CBC WITH DIFFERENTIAL - Abnormal; Notable for the following:    RBC 4.03 (*)     Hemoglobin 12.8 (*)    All other components within normal limits  COMPREHENSIVE METABOLIC PANEL - Abnormal; Notable for the following:    Alkaline Phosphatase 35 (*)    All other components within normal limits  LIPASE, BLOOD  POCT I-STAT TROPONIN I   Imaging Review Dg Chest 2 View  03/30/2013   CLINICAL DATA:  Chest pain, headache, dizziness  EXAM: CHEST - 2 VIEW  COMPARISON:  06/25/2011  FINDINGS: The heart size and mediastinal contours are within normal limits. Both lungs are clear. The visualized skeletal structures are unremarkable. No effusion.  No pneumothorax.  IMPRESSION: No acute cardiopulmonary disease.   Electronically Signed   By: Oley Balm M.D.   On: 03/30/2013 10:36    EKG Interpretation    Date/Time:  Thursday March 30 2013 10:13:43 EST Ventricular Rate:  83 PR Interval:  150 QRS Duration: 94 QT Interval:  376 QTC Calculation: 441 R Axis:   80 Text Interpretation:  Normal sinus rhythm Moderate voltage criteria for LVH, may be normal variant No significant change since last tracing Confirmed by [facility]  MD, Traylon Schimming ([id number]) on 03/30/2013 10:26:05 AM            MDM   1. Headache   2. Chest pain   3. Chronic abdominal pain   4. Chronic back pain   5. Alcoholism    I doubt any other EMC precluding discharge at this time including, but not necessarily limited to the following:AMI, ACS, PE, SAH, CVA, SBI.    Hurman Horn, MD 03/30/13 [phone]

## 2013-03-30 NOTE — ED Notes (Signed)
Patient from [facility], c/o headache and dizziness.

## 2013-03-31 IMAGING — CR DG RIBS W/ CHEST 3+V*L*
4 series · 4 of 4 positions shown · non-contrast
Comparison: 03/25/2005

CLINICAL DATA: Pain post fall.

LEFT RIBS AND CHEST - 3+ VIEW

[w chest pa]
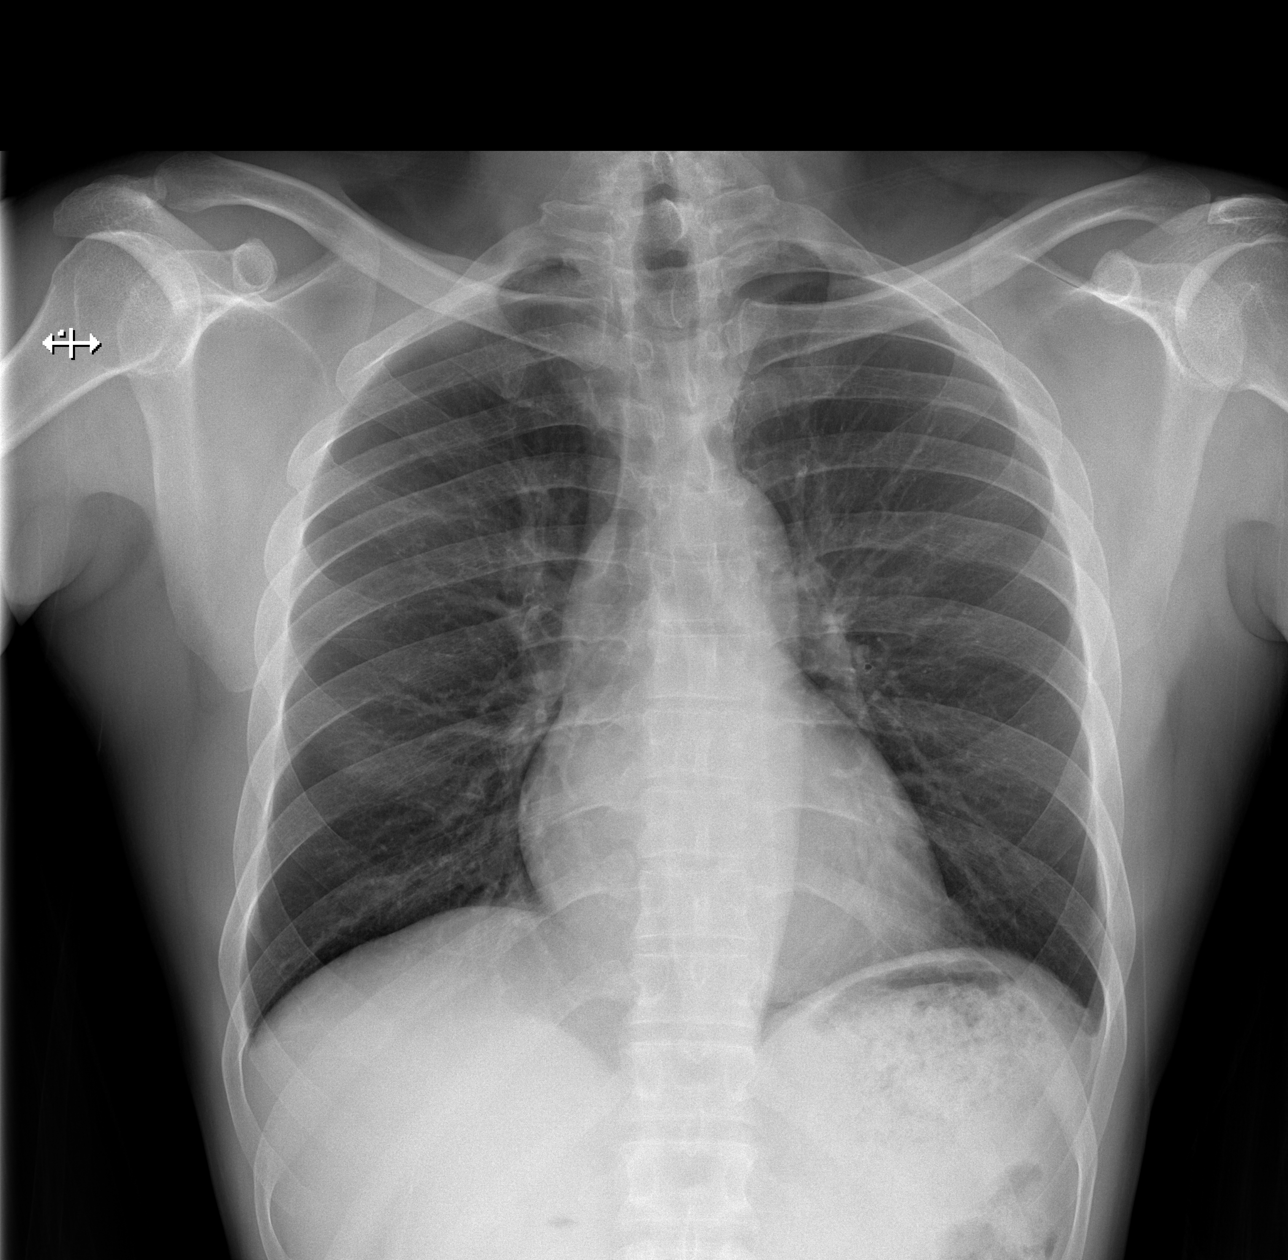

[w ribs ap upper left]
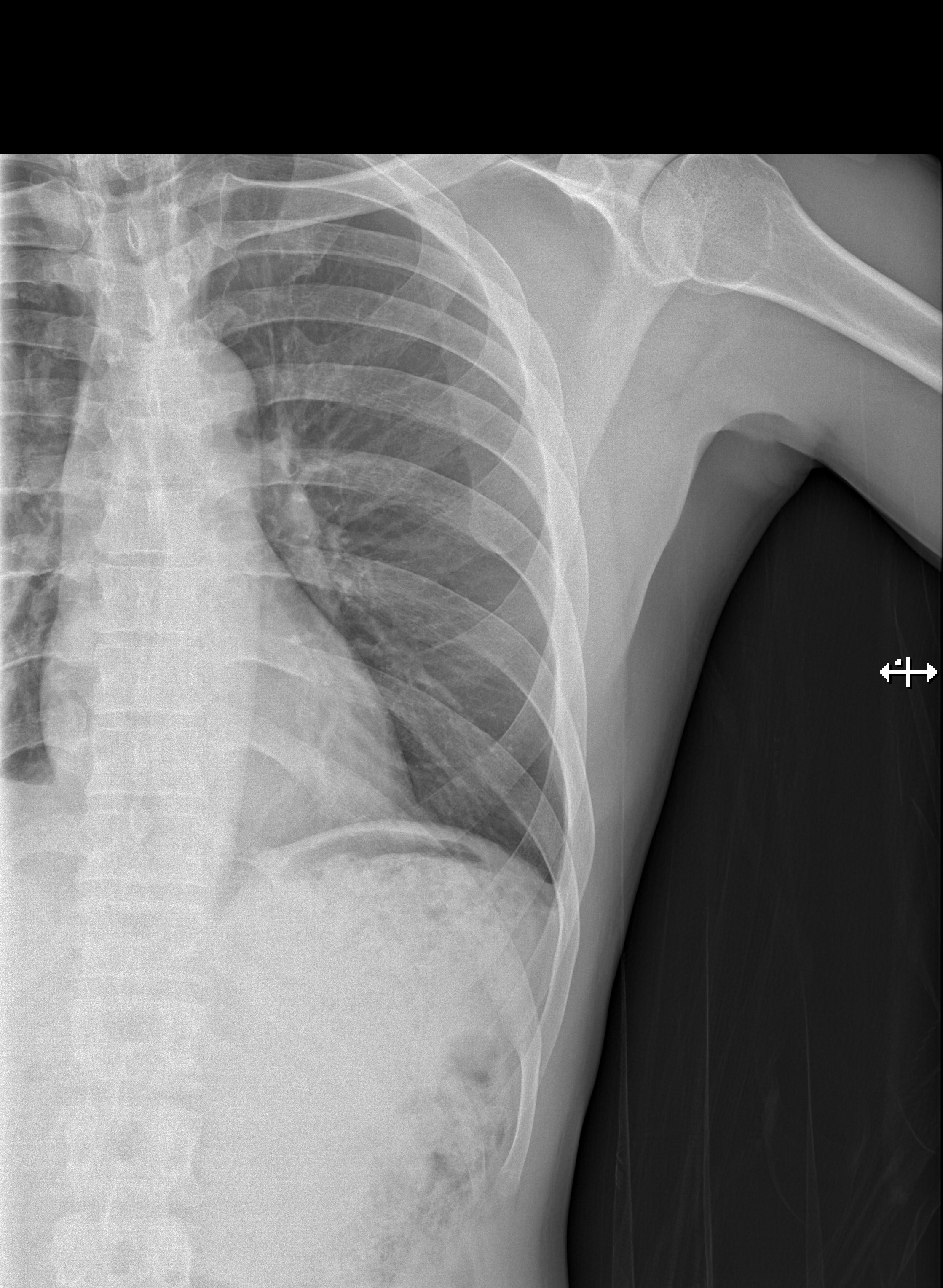

[w ribs ap lower left]
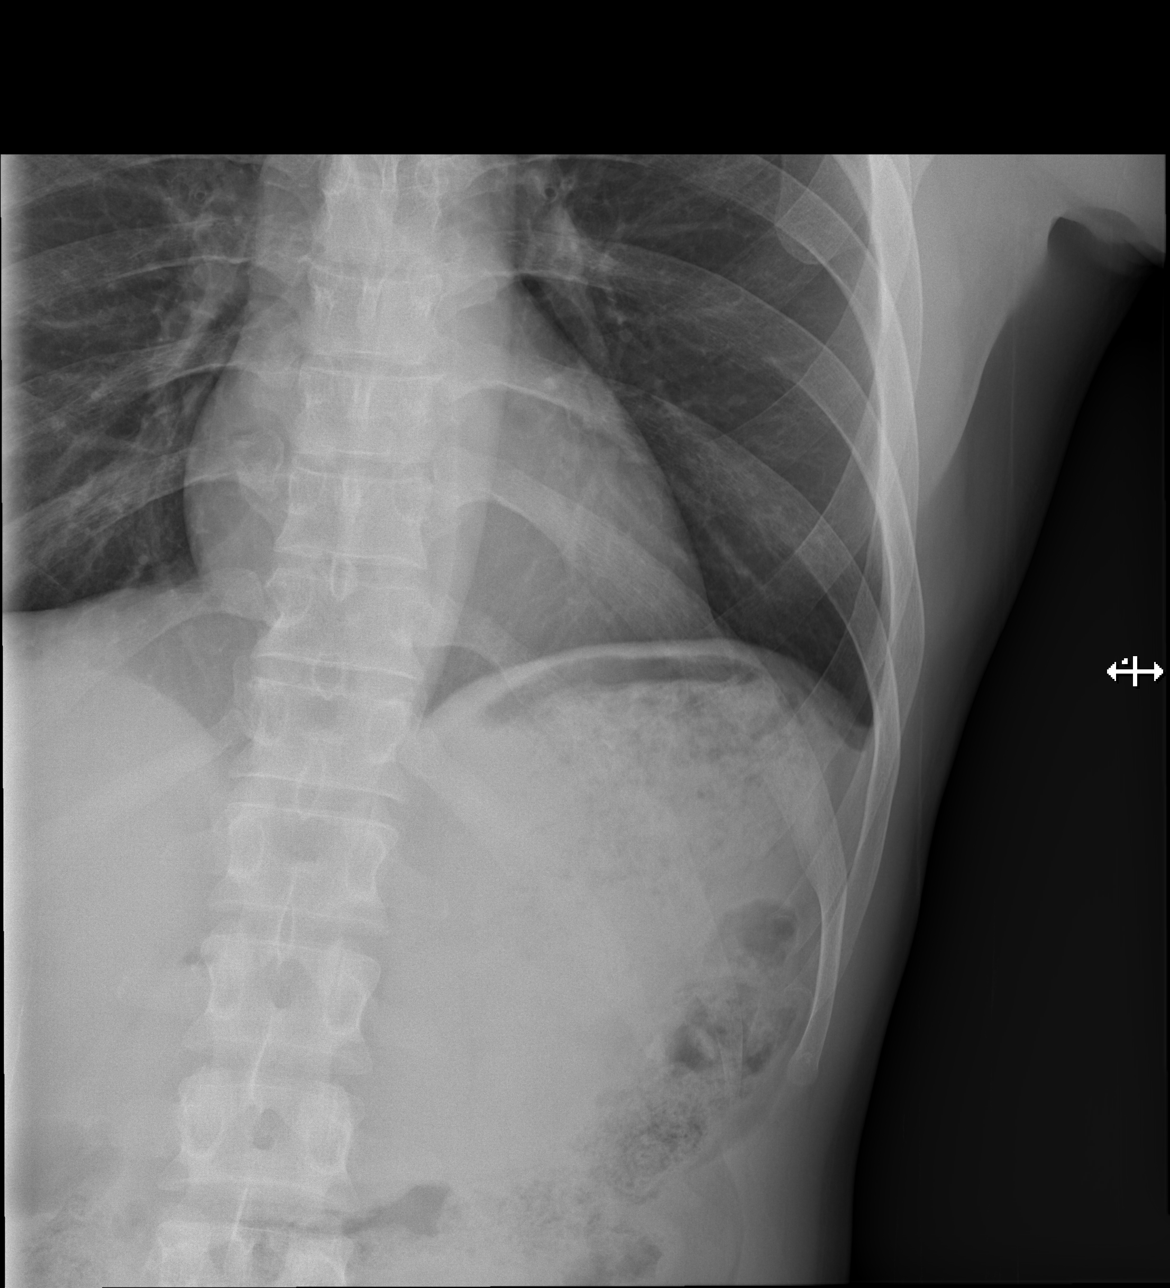

[w ribs obl left]
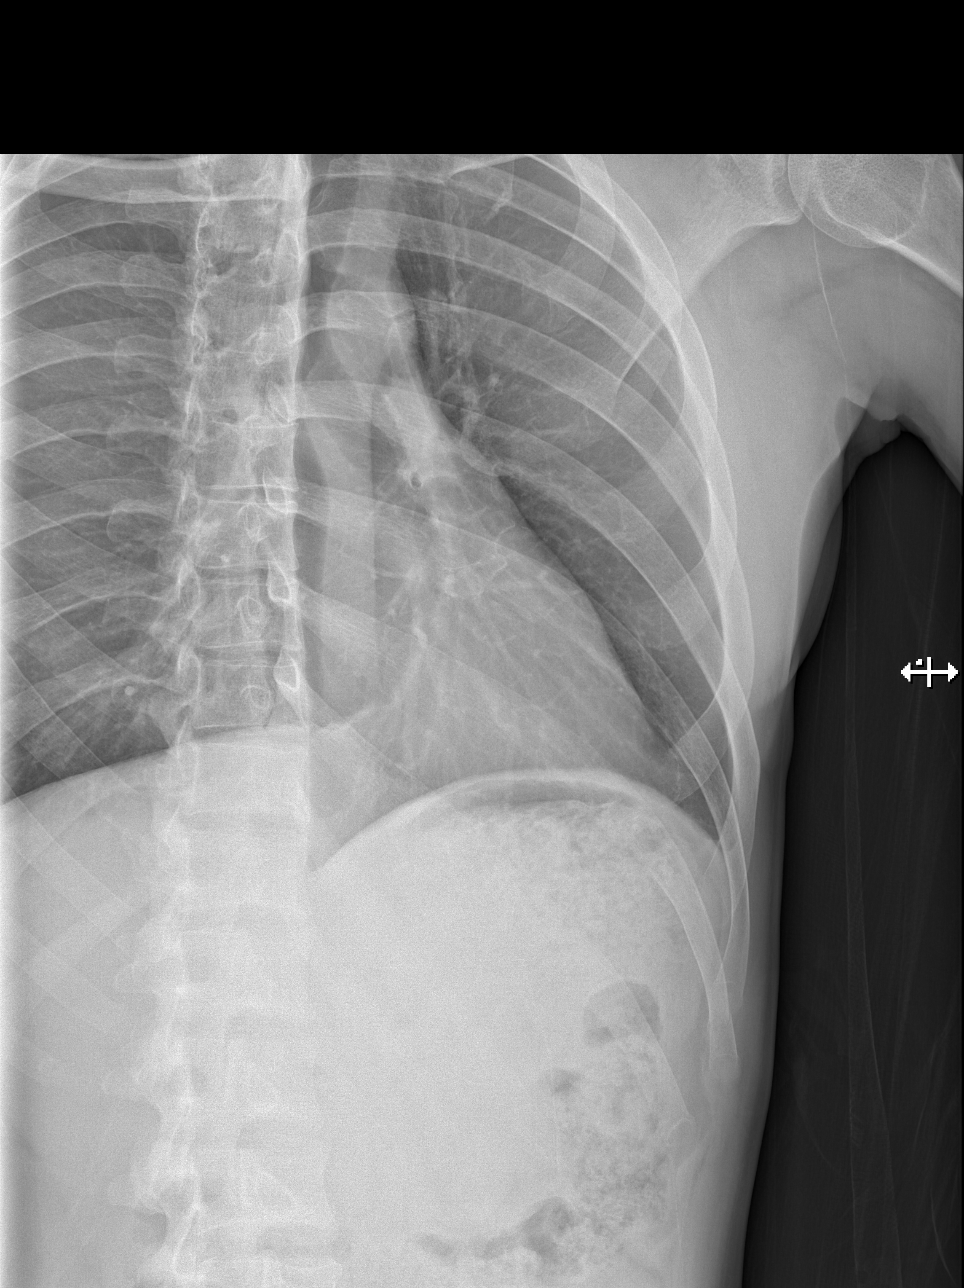

[4 of 4 positions shown; findings below may reference images not displayed]

FINDINGS: No pneumothorax or effusion. There is a minimally
displaced fracture at the lateral aspect of the left ninth rib.
Lungs are clear.  Heart size normal.
IMPRESSION: Left ninth rib fracture, minimally-displaced, without pneumothorax
or other complicating feature.

## 2013-08-17 ENCOUNTER — Emergency Department (HOSPITAL_COMMUNITY)
Admission: EM | Admit: 2013-08-17 | Discharge: 2013-08-17 | Disposition: A | Discharge status: Home / Self Care | Payer: Self-pay | Attending: Emergency Medicine | Admitting: Emergency Medicine

## 2013-08-17 ENCOUNTER — Encounter (HOSPITAL_COMMUNITY): Payer: Self-pay | Admitting: Emergency Medicine

## 2013-08-17 ENCOUNTER — Emergency Department (HOSPITAL_COMMUNITY): Payer: Self-pay

## 2013-08-17 DIAGNOSIS — F1020 Alcohol dependence, uncomplicated: Secondary | ICD-10-CM | POA: Insufficient documentation

## 2013-08-17 DIAGNOSIS — Z8719 Personal history of other diseases of the digestive system: Secondary | ICD-10-CM | POA: Insufficient documentation

## 2013-08-17 DIAGNOSIS — Z862 Personal history of diseases of the blood and blood-forming organs and certain disorders involving the immune mechanism: Secondary | ICD-10-CM | POA: Insufficient documentation

## 2013-08-17 DIAGNOSIS — R109 Unspecified abdominal pain: Secondary | ICD-10-CM

## 2013-08-17 DIAGNOSIS — M549 Dorsalgia, unspecified: Secondary | ICD-10-CM | POA: Insufficient documentation

## 2013-08-17 DIAGNOSIS — F17200 Nicotine dependence, unspecified, uncomplicated: Secondary | ICD-10-CM | POA: Insufficient documentation

## 2013-08-17 DIAGNOSIS — R1031 Right lower quadrant pain: Secondary | ICD-10-CM | POA: Insufficient documentation

## 2013-08-17 DIAGNOSIS — Z7982 Long term (current) use of aspirin: Secondary | ICD-10-CM | POA: Insufficient documentation

## 2013-08-17 DIAGNOSIS — R110 Nausea: Secondary | ICD-10-CM | POA: Insufficient documentation

## 2013-08-17 LAB — CBC WITH DIFFERENTIAL/PLATELET
BASOS PCT: 1 % (ref 0–1)
Basophils Absolute: 0 10*3/uL (ref 0.0–0.1)
Eosinophils Absolute: 0.3 10*3/uL (ref 0.0–0.7)
Eosinophils Relative: 5 % (ref 0–5)
HCT: 35.2 % — ABNORMAL LOW (ref 39.0–52.0)
HEMOGLOBIN: 12 g/dL — AB (ref 13.0–17.0)
LYMPHS PCT: 35 % (ref 12–46)
Lymphs Abs: 2.5 10*3/uL (ref 0.7–4.0)
MCH: 31.1 pg (ref 26.0–34.0)
MCHC: 34.1 g/dL (ref 30.0–36.0)
MCV: 91.2 fL (ref 78.0–100.0)
MONOS PCT: 9 % (ref 3–12)
Monocytes Absolute: 0.6 10*3/uL (ref 0.1–1.0)
NEUTROS ABS: 3.6 10*3/uL (ref 1.7–7.7)
Neutrophils Relative %: 51 % (ref 43–77)
Platelets: 242 10*3/uL (ref 150–400)
RBC: 3.86 MIL/uL — ABNORMAL LOW (ref 4.22–5.81)
RDW: 13 % (ref 11.5–15.5)
WBC: 7.1 10*3/uL (ref 4.0–10.5)

## 2013-08-17 LAB — COMPREHENSIVE METABOLIC PANEL
ALBUMIN: 4 g/dL (ref 3.5–5.2)
ALT: 8 U/L (ref 0–53)
AST: 16 U/L (ref 0–37)
Alkaline Phosphatase: 43 U/L (ref 39–117)
BUN: 7 mg/dL (ref 6–23)
CO2: 29 meq/L (ref 19–32)
Calcium: 9.7 mg/dL (ref 8.4–10.5)
Chloride: 104 mEq/L (ref 96–112)
Creatinine, Ser: 0.95 mg/dL (ref 0.50–1.35)
GFR calc Af Amer: >90 mL/min (ref >90)
GFR calc non Af Amer: >90 mL/min (ref >90)
Glucose, Bld: 112 mg/dL — ABNORMAL HIGH (ref 70–99)
POTASSIUM: 3.8 meq/L (ref 3.7–5.3)
Sodium: 142 mEq/L (ref 137–147)
Total Bilirubin: 0.3 mg/dL (ref 0.3–1.2)
Total Protein: 7 g/dL (ref 6.0–8.3)

## 2013-08-17 LAB — URINALYSIS, ROUTINE W REFLEX MICROSCOPIC
Bilirubin Urine: NEGATIVE
Glucose, UA: NEGATIVE mg/dL
Hgb urine dipstick: NEGATIVE
Ketones, ur: NEGATIVE mg/dL
Leukocytes, UA: NEGATIVE
NITRITE: NEGATIVE
Protein, ur: NEGATIVE mg/dL
SPECIFIC GRAVITY, URINE: 1.016 (ref 1.005–1.030)
UROBILINOGEN UA: 1 mg/dL (ref 0.0–1.0)
pH: 6 (ref 5.0–8.0)

## 2013-08-17 LAB — LIPASE, BLOOD: Lipase: 42 U/L (ref 11–59)

## 2013-08-17 MED ORDER — IOHEXOL 300 MG/ML  SOLN
50.0000 mL | Freq: Once | INTRAMUSCULAR | Status: AC | PRN
  Administered 2013-08-17: 50 mL via ORAL

## 2013-08-17 MED ORDER — SODIUM CHLORIDE 0.9 % IV BOLUS (SEPSIS)
1000.0000 mL | Freq: Once | INTRAVENOUS | Status: AC
  Administered 2013-08-17: 1000 mL via INTRAVENOUS

## 2013-08-17 MED ORDER — ONDANSETRON HCL 4 MG/2ML IJ SOLN
4.0000 mg | Freq: Once | INTRAMUSCULAR | Status: AC
  Administered 2013-08-17: 4 mg via INTRAVENOUS
  Filled 2013-08-17: qty 2

## 2013-08-17 MED ORDER — MORPHINE SULFATE 4 MG/ML IJ SOLN
6.0000 mg | Freq: Once | INTRAMUSCULAR | Status: AC
Start: 2013-08-17 — End: 2013-08-17
  Administered 2013-08-17: 6 mg via INTRAVENOUS
  Filled 2013-08-17: qty 2

## 2013-08-17 MED ORDER — IOHEXOL 300 MG/ML  SOLN
100.0000 mL | Freq: Once | INTRAMUSCULAR | Status: AC | PRN
  Administered 2013-08-17: 100 mL via INTRAVENOUS

## 2013-08-17 NOTE — ED Notes (Signed)
Pt is c/o right side abd pain that he states started early yesterday and has progressively gotten worse  Pt states the pain radiates into his back  Pt states he has nausea without vomiting

## 2013-08-17 NOTE — ED Notes (Signed)
Pt states having R sided abdominal pain radiating around to back area, pt denies urinary symptoms, states is nauseous, denies vomiting or diarrhea, denies hx of kidney stones.

## 2013-08-17 NOTE — Discharge Instructions (Signed)
If you were given medicines take as directed.  If you are on coumadin or contraceptives realize their levels and effectiveness is altered by many different medicines.  If you have any reaction (rash, tongues swelling, other) to the medicines stop taking and see a physician.   Please follow up as directed and return to the ER or see a physician for new or worsening symptoms.  Thank you. Filed Vitals:   08/17/13 0616  BP: 146/89  Pulse: 76  Temp: 97.9 F (36.6 C)  TempSrc: Oral  Resp: 19  Height: 5\' 7"  (1.702 m)  Weight: 148 lb (67.132 kg)  SpO2: 99%

## 2013-08-17 NOTE — ED Notes (Signed)
Pt. Made aware for the need of urine. 

## 2013-08-17 NOTE — ED Provider Notes (Signed)
CSN: [id number]     Arrival date & time 08/17/13  0612 History   First MD Initiated Contact with Patient 08/17/13 [id number]     Chief Complaint  Patient presents with  . Abdominal Pain     (Consider location/radiation/quality/duration/timing/severity/associated sxs/prior Treatment) HPI Comments: 43 year old male with polysubstance abuse, alcohol dependence, colitis nonspecific presents with right lower quadrant pain since yesterday. Pain is sharp and ache, gradually worsening accompanied by nausea. Patient has had brief episode of possible nothing significant. His been clean from alcohol for 6 months. No history of abdominal surgeries. Patient denies GI bleeding, fevers. Mild radiation to the back.  Patient is a 43 y.o. male presenting with abdominal pain. The history is provided by the patient.  Abdominal Pain Associated symptoms: nausea   Associated symptoms: no chest pain, no chills, no dysuria, no fever, no shortness of breath and no vomiting     Past Medical History  Diagnosis Date  . Alcohol abuse   . Anemia   . Severe major depression with psychotic features   . Colitis    History reviewed. No pertinent past surgical history. Family History  Problem Relation Age of Onset  . Hypertension Mother   . Hypertension Father   . Hypertension Other    History  Substance Use Topics  . Smoking status: Current Every Day Smoker -- 0.33 packs/day    Types: Cigarettes  . Smokeless tobacco: Not on file  . Alcohol Use: No     Comment: former  last use 5 mths ago     Review of Systems  Constitutional: Negative for fever and chills.  HENT: Negative for congestion.   Eyes: Negative for visual disturbance.  Respiratory: Negative for shortness of breath.   Cardiovascular: Negative for chest pain.  Gastrointestinal: Positive for nausea and abdominal pain. Negative for vomiting.  Genitourinary: Negative for dysuria and flank pain.  Musculoskeletal: Positive for back pain. Negative for neck  pain and neck stiffness.  Skin: Negative for rash.  Neurological: Negative for light-headedness and headaches.      Allergies  Sertraline  Home Medications   Prior to Admission medications   Medication Sig Start Date End Date Taking? Authorizing Provider  aspirin 325 MG tablet Take 325 mg by mouth once.   Yes Historical Provider, MD  Multiple Vitamin (MULTIVITAMIN WITH MINERALS) TABS tablet Take 1 tablet by mouth daily.   Yes Historical Provider, MD   BP 146/89  Pulse 76  Temp(Src) 97.9 F (36.6 C) (Oral)  Resp 19  Ht 5\' 7"  (1.702 m)  Wt 148 lb (67.132 kg)  BMI 23.17 kg/m2  SpO2 99% Physical Exam  Nursing note and vitals reviewed. Constitutional: He is oriented to person, place, and time. He appears well-developed and well-nourished.  HENT:  Head: Normocephalic and atraumatic.  Eyes: Conjunctivae are normal. Right eye exhibits no discharge. Left eye exhibits no discharge.  Neck: Normal range of motion. Neck supple. No tracheal deviation present.  Cardiovascular: Normal rate and regular rhythm.   Pulmonary/Chest: Effort normal and breath sounds normal.  Abdominal: Soft. He exhibits no distension. There is tenderness (moderate right lower quadrant.). There is no guarding.  Musculoskeletal: He exhibits no edema.  Neurological: He is alert and oriented to person, place, and time.  Skin: Skin is warm. No rash noted.  Psychiatric: He has a normal mood and affect.    ED Course  Procedures (including critical care time) Labs Review Labs Reviewed  CBC WITH DIFFERENTIAL - Abnormal; Notable for the following:  RBC 3.86 (*)    Hemoglobin 12.0 (*)    HCT 35.2 (*)    All other components within normal limits  COMPREHENSIVE METABOLIC PANEL - Abnormal; Notable for the following:    Glucose, Bld 112 (*)    All other components within normal limits  LIPASE, BLOOD  URINALYSIS, ROUTINE W REFLEX MICROSCOPIC    Imaging Review Ct Abdomen Pelvis W Contrast  08/17/2013   CLINICAL  DATA:  Right lower quadrant pain radiating to the back.  EXAM: CT ABDOMEN AND PELVIS WITH CONTRAST  TECHNIQUE: Multidetector CT imaging of the abdomen and pelvis was performed using the standard protocol following bolus administration of intravenous contrast.  CONTRAST:  50mL OMNIPAQUE IOHEXOL 300 MG/ML SOLN, 100mL OMNIPAQUE IOHEXOL 300 MG/ML SOLN  COMPARISON:  02/09/2013  FINDINGS: Normal appendix is visualized. Colon is unremarkable. No evidence of colitis/diverticulitis.  Dependent subsegmental atelectasis is noted at the lung bases. The lung bases are otherwise clear.  Subtle hypo attenuating focus at the dome of the right lobe of the liver, nonspecific and not seen on prior study. This may simply reflect a small for fusion abnormality. It could potentially reflect a small hemangioma. Liver otherwise unremarkable.  Normal spleen, gallbladder and pancreas. No bile duct dilation. Normal adrenal glands. Normal kidneys, ureters and bladder.  No adenopathy. No abnormal fluid collections. Small bowel is unremarkable.  No significant bony abnormality.  IMPRESSION: 1. No acute findings. 2. Normal appendix. No evidence of colitis/diverticulitis. No findings to explain right lower quadrant pain.   Electronically Signed   By: Amie Portlandavid  Ormond M.D.   On: 08/17/2013 09:34     EKG Interpretation None      MDM   Final diagnoses:  Abdominal pain    Clinically patient with colitis first pancreatitis versus appendicitis first other. Plan for blood work, CT scan, fluids, antiemetics and pain medicines..  Pain improved on Recheck. CT no acute findings.  Results and differential diagnosis were discussed with the patient. Close follow up outpatient was discussed, patient comfortable with the plan.   Filed Vitals:   08/17/13 0616  BP: 146/89  Pulse: 76  Temp: 97.9 F (36.6 C)  TempSrc: Oral  Resp: 19  Height: 5\' 7"  (1.702 m)  Weight: 148 lb (67.132 kg)  SpO2: 99%         Enid SkeensJoshua M Jonet Mathies,  MD 08/17/13 [phone]

## 2013-08-18 ENCOUNTER — Encounter (HOSPITAL_COMMUNITY): Payer: Self-pay | Admitting: Emergency Medicine

## 2013-08-18 ENCOUNTER — Emergency Department (HOSPITAL_COMMUNITY)
Admission: EM | Admit: 2013-08-18 | Discharge: 2013-08-18 | Disposition: A | Discharge status: Home / Self Care | Payer: Self-pay | Attending: Emergency Medicine | Admitting: Emergency Medicine

## 2013-08-18 DIAGNOSIS — Z862 Personal history of diseases of the blood and blood-forming organs and certain disorders involving the immune mechanism: Secondary | ICD-10-CM | POA: Insufficient documentation

## 2013-08-18 DIAGNOSIS — F17200 Nicotine dependence, unspecified, uncomplicated: Secondary | ICD-10-CM | POA: Insufficient documentation

## 2013-08-18 DIAGNOSIS — R112 Nausea with vomiting, unspecified: Secondary | ICD-10-CM | POA: Insufficient documentation

## 2013-08-18 DIAGNOSIS — G8929 Other chronic pain: Secondary | ICD-10-CM | POA: Insufficient documentation

## 2013-08-18 DIAGNOSIS — R109 Unspecified abdominal pain: Secondary | ICD-10-CM

## 2013-08-18 DIAGNOSIS — Z8719 Personal history of other diseases of the digestive system: Secondary | ICD-10-CM | POA: Insufficient documentation

## 2013-08-18 DIAGNOSIS — Z7982 Long term (current) use of aspirin: Secondary | ICD-10-CM | POA: Insufficient documentation

## 2013-08-18 DIAGNOSIS — R1011 Right upper quadrant pain: Secondary | ICD-10-CM | POA: Insufficient documentation

## 2013-08-18 DIAGNOSIS — Z8659 Personal history of other mental and behavioral disorders: Secondary | ICD-10-CM | POA: Insufficient documentation

## 2013-08-18 DIAGNOSIS — R1031 Right lower quadrant pain: Secondary | ICD-10-CM | POA: Insufficient documentation

## 2013-08-18 MED ORDER — ONDANSETRON 4 MG PO TBDP: 4.0000 mg | ORAL_TABLET | Freq: Three times a day (TID) | ORAL | Status: DC | PRN

## 2013-08-18 MED ORDER — ONDANSETRON 4 MG PO TBDP
4.0000 mg | ORAL_TABLET | Freq: Once | ORAL | Status: AC
  Administered 2013-08-18: 4 mg via ORAL
  Filled 2013-08-18: qty 1

## 2013-08-18 MED ORDER — KETOROLAC TROMETHAMINE 30 MG/ML IJ SOLN
30.0000 mg | Freq: Once | INTRAMUSCULAR | Status: AC
  Administered 2013-08-18: 30 mg via INTRAMUSCULAR
  Filled 2013-08-18: qty 1

## 2013-08-18 NOTE — ED Provider Notes (Signed)
Medical screening examination/treatment/procedure(s) were performed by non-physician practitioner and as supervising physician I was immediately available for consultation/collaboration.   EKG Interpretation None        Hanley SeamenJohn L Dnya Hickle, MD 08/18/13 0530

## 2013-08-18 NOTE — ED Provider Notes (Signed)
CSN: [id number]     Arrival date & time 08/18/13  0051 History   First MD Initiated Contact with Patient 08/18/13 0127     Chief Complaint  Patient presents with  . Abdominal Pain     (Consider location/radiation/quality/duration/timing/severity/associated sxs/prior Treatment) HPI Comments: This is a 43 year old man, with a history of, alcohol abuse and polysubstance abuse, who, states she's been clean for the past 6 months with recurrent chronic right sided pain.  He was evaluated last night, less than 24 hours ago, with labs, CT scan, which all were normal.  He, states he was discharged home.  He did not feel any prescriptions presents now with persistent pain and nausea.  Denies any fever, constipation, diarrhea, trauma  Patient is a 43 y.o. male presenting with abdominal pain. The history is provided by the patient.  Abdominal Pain Pain location:  RUQ and RLQ Pain quality: aching   Pain radiates to:  Does not radiate Pain severity:  Mild Onset quality:  Unable to specify Timing:  Intermittent Progression:  Unchanged Chronicity:  Recurrent Context: retching   Relieved by:  None tried Ineffective treatments:  None tried Associated symptoms: nausea and vomiting   Associated symptoms: no constipation, no diarrhea, no dysuria, no fever and no shortness of breath     Past Medical History  Diagnosis Date  . Alcohol abuse   . Anemia   . Severe major depression with psychotic features   . Colitis    History reviewed. No pertinent past surgical history. Family History  Problem Relation Age of Onset  . Hypertension Mother   . Hypertension Father   . Hypertension Other    History  Substance Use Topics  . Smoking status: Current Every Day Smoker -- 0.33 packs/day    Types: Cigarettes  . Smokeless tobacco: Not on file  . Alcohol Use: No     Comment: former  last use 5 mths ago     Review of Systems  Unable to perform ROS Constitutional: Negative for fever.  Respiratory:  Negative for shortness of breath.   Gastrointestinal: Positive for nausea, vomiting and abdominal pain. Negative for diarrhea and constipation.  Genitourinary: Negative for dysuria.  Skin: Negative for rash and wound.  All other systems reviewed and are negative.     Allergies  Sertraline  Home Medications   Prior to Admission medications   Medication Sig Start Date End Date Taking? Authorizing Provider  aspirin 325 MG tablet Take 325 mg by mouth once.   Yes Historical Provider, MD  Multiple Vitamin (MULTIVITAMIN WITH MINERALS) TABS tablet Take 1 tablet by mouth daily.   Yes Historical Provider, MD  ondansetron (ZOFRAN-ODT) 4 MG disintegrating tablet Take 1 tablet (4 mg total) by mouth every 8 (eight) hours as needed for nausea or vomiting. 08/18/13   Arman FilterGail K Tyshana Nishida, NP   BP 107/68  Pulse 71  Temp(Src) 97.9 F (36.6 C) (Oral)  Resp 16  SpO2 99% Physical Exam  Vitals reviewed. Constitutional: He is oriented to person, place, and time. He appears well-developed and well-nourished.  HENT:  Head: Normocephalic.  Eyes: Pupils are equal, round, and reactive to light.  Cardiovascular: Normal rate.   Pulmonary/Chest: Effort normal.  Abdominal: Soft. He exhibits no distension. There is no tenderness.  Musculoskeletal: Normal range of motion.  Neurological: He is alert and oriented to person, place, and time.  Skin: Skin is warm. No rash noted.    ED Course  Procedures (including critical care time) Labs Review Labs Reviewed -  No data to display  Imaging Review Ct Abdomen Pelvis W Contrast  08/17/2013   CLINICAL DATA:  Right lower quadrant pain radiating to the back.  EXAM: CT ABDOMEN AND PELVIS WITH CONTRAST  TECHNIQUE: Multidetector CT imaging of the abdomen and pelvis was performed using the standard protocol following bolus administration of intravenous contrast.  CONTRAST:  50mL OMNIPAQUE IOHEXOL 300 MG/ML SOLN, 100mL OMNIPAQUE IOHEXOL 300 MG/ML SOLN  COMPARISON:  02/09/2013   FINDINGS: Normal appendix is visualized. Colon is unremarkable. No evidence of colitis/diverticulitis.  Dependent subsegmental atelectasis is noted at the lung bases. The lung bases are otherwise clear.  Subtle hypo attenuating focus at the dome of the right lobe of the liver, nonspecific and not seen on prior study. This may simply reflect a small for fusion abnormality. It could potentially reflect a small hemangioma. Liver otherwise unremarkable.  Normal spleen, gallbladder and pancreas. No bile duct dilation. Normal adrenal glands. Normal kidneys, ureters and bladder.  No adenopathy. No abnormal fluid collections. Small bowel is unremarkable.  No significant bony abnormality.  IMPRESSION: 1. No acute findings. 2. Normal appendix. No evidence of colitis/diverticulitis. No findings to explain right lower quadrant pain.   Electronically Signed   By: Amie Portlandavid  Ormond M.D.   On: 08/17/2013 09:34     EKG Interpretation None      MDM  Patient was given IM toward her on by mouth Zofran, and then reevaluated.  He was symptom-free at that time.  He will be discharged him with prescription for Zofran, and a referral to our GI for persistent abdominal pain.  He's been instructed to return if he develops, fever, or new symptoms such as diarrhea or constipation Final diagnoses:  Abdominal pain         Arman FilterGail K Daylah Sayavong, NP 08/18/13 [phone]

## 2013-08-18 NOTE — ED Notes (Signed)
Bed: WA09 Expected date:  Expected time:  Means of arrival:  Comments: EMS/42 yo male with abd pain

## 2013-08-18 NOTE — Discharge Instructions (Signed)
You have been given a prescription for Zofran that, you can use for nausea.  If you have persistent abdominal pain.  Please make an appointment with all of our GI.  Your CT scan, and labs in 24 hours ago are all normal

## 2013-08-18 NOTE — ED Notes (Signed)
Pt was seen less than 24 hrs ago for abdominal pain, presents with right sided abdominal pain, states he wants to be re-evaluated. Pt In NAD.

## 2013-10-25 ENCOUNTER — Emergency Department (HOSPITAL_COMMUNITY)
Admission: EM | Admit: 2013-10-25 | Discharge: 2013-10-25 | Disposition: A | Discharge status: Home / Self Care | Payer: Self-pay | Attending: Emergency Medicine | Admitting: Emergency Medicine

## 2013-10-25 ENCOUNTER — Encounter (HOSPITAL_COMMUNITY): Payer: Self-pay | Admitting: Emergency Medicine

## 2013-10-25 DIAGNOSIS — Z79899 Other long term (current) drug therapy: Secondary | ICD-10-CM | POA: Insufficient documentation

## 2013-10-25 DIAGNOSIS — Z8719 Personal history of other diseases of the digestive system: Secondary | ICD-10-CM | POA: Insufficient documentation

## 2013-10-25 DIAGNOSIS — Z8659 Personal history of other mental and behavioral disorders: Secondary | ICD-10-CM | POA: Insufficient documentation

## 2013-10-25 DIAGNOSIS — Z862 Personal history of diseases of the blood and blood-forming organs and certain disorders involving the immune mechanism: Secondary | ICD-10-CM | POA: Insufficient documentation

## 2013-10-25 DIAGNOSIS — R1031 Right lower quadrant pain: Secondary | ICD-10-CM

## 2013-10-25 DIAGNOSIS — F17200 Nicotine dependence, unspecified, uncomplicated: Secondary | ICD-10-CM | POA: Insufficient documentation

## 2013-10-25 DIAGNOSIS — R1011 Right upper quadrant pain: Secondary | ICD-10-CM

## 2013-10-25 LAB — CBC WITH DIFFERENTIAL/PLATELET
BASOS ABS: 0 10*3/uL (ref 0.0–0.1)
Basophils Relative: 0 % (ref 0–1)
EOS PCT: 5 % (ref 0–5)
Eosinophils Absolute: 0.4 10*3/uL (ref 0.0–0.7)
HCT: 35.3 % — ABNORMAL LOW (ref 39.0–52.0)
Hemoglobin: 11.7 g/dL — ABNORMAL LOW (ref 13.0–17.0)
LYMPHS ABS: 3.7 10*3/uL (ref 0.7–4.0)
LYMPHS PCT: 44 % (ref 12–46)
MCH: 29.8 pg (ref 26.0–34.0)
MCHC: 33.1 g/dL (ref 30.0–36.0)
MCV: 89.8 fL (ref 78.0–100.0)
Monocytes Absolute: 0.7 10*3/uL (ref 0.1–1.0)
Monocytes Relative: 8 % (ref 3–12)
NEUTROS PCT: 43 % (ref 43–77)
Neutro Abs: 3.7 10*3/uL (ref 1.7–7.7)
PLATELETS: 238 10*3/uL (ref 150–400)
RBC: 3.93 MIL/uL — ABNORMAL LOW (ref 4.22–5.81)
RDW: 12.8 % (ref 11.5–15.5)
WBC: 8.6 10*3/uL (ref 4.0–10.5)

## 2013-10-25 LAB — URINALYSIS, ROUTINE W REFLEX MICROSCOPIC
Bilirubin Urine: NEGATIVE
GLUCOSE, UA: NEGATIVE mg/dL
HGB URINE DIPSTICK: NEGATIVE
KETONES UR: NEGATIVE mg/dL
Leukocytes, UA: NEGATIVE
Nitrite: NEGATIVE
PH: 7 (ref 5.0–8.0)
Protein, ur: NEGATIVE mg/dL
Specific Gravity, Urine: 1.02 (ref 1.005–1.030)
Urobilinogen, UA: 1 mg/dL (ref 0.0–1.0)

## 2013-10-25 LAB — COMPREHENSIVE METABOLIC PANEL
ALK PHOS: 36 U/L — AB (ref 39–117)
ALT: 9 U/L (ref 0–53)
AST: 18 U/L (ref 0–37)
Albumin: 4.1 g/dL (ref 3.5–5.2)
BUN: 13 mg/dL (ref 6–23)
CALCIUM: 9.4 mg/dL (ref 8.4–10.5)
CO2: 25 mEq/L (ref 19–32)
Chloride: 100 mEq/L (ref 96–112)
Creatinine, Ser: 0.94 mg/dL (ref 0.50–1.35)
GFR calc non Af Amer: >90 mL/min (ref >90)
GLUCOSE: 90 mg/dL (ref 70–99)
POTASSIUM: 3.9 meq/L (ref 3.7–5.3)
SODIUM: 138 meq/L (ref 137–147)
Total Bilirubin: 0.4 mg/dL (ref 0.3–1.2)
Total Protein: 7.2 g/dL (ref 6.0–8.3)

## 2013-10-25 LAB — LIPASE, BLOOD: Lipase: 48 U/L (ref 11–59)

## 2013-10-25 MED ORDER — KETOROLAC TROMETHAMINE 60 MG/2ML IM SOLN
60.0000 mg | Freq: Once | INTRAMUSCULAR | Status: AC
  Administered 2013-10-25: 60 mg via INTRAMUSCULAR
  Filled 2013-10-25: qty 2

## 2013-10-25 MED ORDER — ONDANSETRON 4 MG PO TBDP: 4.0000 mg | ORAL_TABLET | Freq: Three times a day (TID) | ORAL | Status: DC | PRN

## 2013-10-25 MED ORDER — DICYCLOMINE HCL 20 MG PO TABS: 20.0000 mg | ORAL_TABLET | Freq: Two times a day (BID) | ORAL | Status: DC

## 2013-10-25 MED ORDER — ONDANSETRON 8 MG PO TBDP
8.0000 mg | ORAL_TABLET | Freq: Once | ORAL | Status: AC
  Administered 2013-10-25: 8 mg via ORAL
  Filled 2013-10-25: qty 1

## 2013-10-25 NOTE — Discharge Instructions (Signed)

## 2013-10-25 NOTE — ED Notes (Signed)
Per EMS report: pt from home: Pt c/o of right sided abd pain and lower back pain.  Pt [place] bus stop. Pt reports he's had similar pain when he had colitis.  Pt reports it feels similar. Pt ambulatory.  Pt a/o x 4. Pt endorses nausea without vomiting but has diarrhea x 2 days.

## 2013-10-25 NOTE — ED Notes (Signed)
Bed: [facility]2 Expected date: 10/25/13 Expected time: 3:29 AM Means of arrival: Ambulance Comments: Flank pain

## 2013-10-27 NOTE — ED Provider Notes (Signed)
CSN: [id number]     Arrival date & time 10/25/13  [id number] History   First MD Initiated Contact with Patient 10/25/13 [phone]     Chief Complaint  Patient presents with  . Abdominal Pain  . Back Pain    (Consider location/radiation/quality/duration/timing/severity/associated sxs/prior Treatment) HPI Comments: Patient is a 43 year old male who presents for right-sided abdominal pain with onset 4 days ago. Patient states that pain is sharp and aching in nature and has been constant and waxing and waning in severity since onset. Patient has taken NSAIDs for pain without relief. He states that his pain feels similar to when he had colitis. Symptoms associated with nausea as well as loose, nonbloody stool in x2 days. Patient denies associated fever, chest pain, shortness of breath, vomiting, melena or hematochezia, urinary symptoms, numbness/paresthesias coming in weakness.  Patient is a 43 y.o. male presenting with abdominal pain and back pain. The history is provided by the patient. No language interpreter was used.  Abdominal Pain Associated symptoms: diarrhea and nausea   Associated symptoms: no chest pain, no dysuria, no fever, no hematuria, no shortness of breath and no vomiting   Back Pain Associated symptoms: abdominal pain   Associated symptoms: no chest pain, no dysuria and no fever     Past Medical History  Diagnosis Date  . Alcohol abuse   . Anemia   . Severe major depression with psychotic features   . Colitis    History reviewed. No pertinent past surgical history. Family History  Problem Relation Age of Onset  . Hypertension Mother   . Hypertension Father   . Hypertension Other    History  Substance Use Topics  . Smoking status: Current Every Day Smoker -- 0.25 packs/day    Types: Cigarettes  . Smokeless tobacco: Not on file  . Alcohol Use: No     Comment: former  last use 5 mths ago     Review of Systems  Constitutional: Negative for fever.  Respiratory: Negative for  shortness of breath.   Cardiovascular: Negative for chest pain.  Gastrointestinal: Positive for nausea, abdominal pain and diarrhea. Negative for vomiting and blood in stool.  Genitourinary: Negative for dysuria and hematuria.  Musculoskeletal: Positive for back pain.  All other systems reviewed and are negative.    Allergies  Sertraline  Home Medications   Prior to Admission medications   Medication Sig Start Date End Date Taking? Authorizing Provider  Aspirin-Caffeine 845-65 MG PACK Take 1 Package by mouth every 4 (four) hours as needed (pain).   Yes Historical Provider, MD  ibuprofen (ADVIL,MOTRIN) 200 MG tablet Take 400 mg by mouth every 6 (six) hours as needed for moderate pain.   Yes Historical Provider, MD  Multiple Vitamin (MULTIVITAMIN WITH MINERALS) TABS tablet Take 1 tablet by mouth daily.   Yes Historical Provider, MD  dicyclomine (BENTYL) 20 MG tablet Take 1 tablet (20 mg total) by mouth 2 (two) times daily. 10/25/13   Antony MaduraKelly Humes, PA-C  ondansetron (ZOFRAN ODT) 4 MG disintegrating tablet Take 1 tablet (4 mg total) by mouth every 8 (eight) hours as needed for nausea or vomiting. 10/25/13   Antony MaduraKelly Humes, PA-C   BP 104/64  Pulse 75  Temp(Src) 98.6 F (37 C) (Oral)  Resp 18  SpO2 98%  Physical Exam  Nursing note and vitals reviewed. Constitutional: He is oriented to person, place, and time. He appears well-developed and well-nourished. No distress.  Nontoxic/nonseptic appearing  HENT:  Head: Normocephalic and atraumatic.  Mouth/Throat: Oropharynx is  clear and moist. No oropharyngeal exudate.  Eyes: Conjunctivae and EOM are normal. No scleral icterus.  Neck: Normal range of motion. Neck supple.  Cardiovascular: Normal rate, regular rhythm and normal heart sounds.   Pulmonary/Chest: Effort normal and breath sounds normal. No respiratory distress. He has no wheezes. He has no rales.  Abdominal: Soft. He exhibits no distension. There is tenderness (Tenderness to palpation  of right lower quadrant, right mid abdomen, and right upper abdomen.). There is no rebound and no guarding.  No peritoneal signs. Negative Murphy sign.  Musculoskeletal: Normal range of motion.  Neurological: He is alert and oriented to person, place, and time.  GCS 15. Patient moves extremities without ataxia  Skin: Skin is warm and dry. No rash noted. He is not diaphoretic. No erythema. No pallor.  Psychiatric: He has a normal mood and affect. His behavior is normal.    ED Course  Procedures (including critical care time) Labs Review Labs Reviewed  CBC WITH DIFFERENTIAL - Abnormal; Notable for the following:    RBC 3.93 (*)    Hemoglobin 11.7 (*)    HCT 35.3 (*)    All other components within normal limits  COMPREHENSIVE METABOLIC PANEL - Abnormal; Notable for the following:    Alkaline Phosphatase 36 (*)    All other components within normal limits  LIPASE, BLOOD  URINALYSIS, ROUTINE W REFLEX MICROSCOPIC    Imaging Review No results found.   EKG Interpretation None      MDM   Final diagnoses:  Right lower quadrant abdominal pain  Right upper quadrant pain    Patient presents for right-sided abdominal pain with associated nausea and mild loose stools x2 days. Stools have been nonbloody. Patient today is well and nontoxic appearing, hemodynamically stable, and afebrile. Physical exam is significant for tenderness to palpation of right abdomen without peritoneal signs or guarding. Labs today are consistent with priors. No leukocytosis or electrolyte imbalance. Liver and kidney function preserved. Urinalysis does not suggest infection.   Patient's records reviewed which shows evidence of colitis back in March 2014. Colitis resolved without complications and patient has since had 3 CT scans performed for similar pain all of which have shown no evidence of colitis. Doubt appendicitis given duration of symptoms, lack of leukocytosis, and focal TTP in patient's RLQ; tenderness,  rather, spans most of patient's R abdomen.   Abdominal reexaminations today have been stable. Patient treated with Toradol and Zofran. Given stable abdominal exams, history of similar pain in the absence of colitis, and reassuring laboratory workup, do not believe additional CT is warranted at this time. Will prescribe Zofran and Bentyl for symptomatic as outpatient. Advised primary care followup. Return precautions provided and patient agreeable to plan with no unaddressed concerns.   Filed Vitals:   10/25/13 0341 10/25/13 0630  BP: 118/75 104/64  Pulse: 77 75  Temp: 97.9 F (36.6 C) 98.6 F (37 C)  TempSrc: Oral Oral  Resp: 18 18  SpO2: 97% 98%       Antony MaduraKelly Humes, PA-C 10/27/13 1949

## 2013-10-28 NOTE — ED Provider Notes (Signed)
Medical screening examination/treatment/procedure(s) were performed by non-physician practitioner and as supervising physician I was immediately available for consultation/collaboration.   EKG Interpretation None       Olivia Mackielga M Otter, MD 10/28/13 1114

## 2013-11-11 ENCOUNTER — Encounter (HOSPITAL_COMMUNITY): Payer: Self-pay | Admitting: Emergency Medicine

## 2013-11-11 ENCOUNTER — Emergency Department (HOSPITAL_COMMUNITY)
Admission: EM | Admit: 2013-11-11 | Discharge: 2013-11-11 | Disposition: A | Discharge status: Home / Self Care | Payer: Self-pay | Attending: Emergency Medicine | Admitting: Emergency Medicine

## 2013-11-11 DIAGNOSIS — R1031 Right lower quadrant pain: Secondary | ICD-10-CM | POA: Insufficient documentation

## 2013-11-11 DIAGNOSIS — F17200 Nicotine dependence, unspecified, uncomplicated: Secondary | ICD-10-CM | POA: Insufficient documentation

## 2013-11-11 DIAGNOSIS — Z862 Personal history of diseases of the blood and blood-forming organs and certain disorders involving the immune mechanism: Secondary | ICD-10-CM | POA: Insufficient documentation

## 2013-11-11 DIAGNOSIS — R1011 Right upper quadrant pain: Secondary | ICD-10-CM | POA: Insufficient documentation

## 2013-11-11 DIAGNOSIS — Z8719 Personal history of other diseases of the digestive system: Secondary | ICD-10-CM | POA: Insufficient documentation

## 2013-11-11 DIAGNOSIS — R109 Unspecified abdominal pain: Secondary | ICD-10-CM

## 2013-11-11 DIAGNOSIS — Z8659 Personal history of other mental and behavioral disorders: Secondary | ICD-10-CM | POA: Insufficient documentation

## 2013-11-11 LAB — COMPREHENSIVE METABOLIC PANEL
ALT: 7 U/L (ref 0–53)
ANION GAP: 12 (ref 5–15)
AST: 13 U/L (ref 0–37)
Albumin: 4.1 g/dL (ref 3.5–5.2)
Alkaline Phosphatase: 32 U/L — ABNORMAL LOW (ref 39–117)
BILIRUBIN TOTAL: 0.5 mg/dL (ref 0.3–1.2)
BUN: 9 mg/dL (ref 6–23)
CHLORIDE: 102 meq/L (ref 96–112)
CO2: 28 meq/L (ref 19–32)
CREATININE: 0.86 mg/dL (ref 0.50–1.35)
Calcium: 9.5 mg/dL (ref 8.4–10.5)
GFR calc non Af Amer: >90 mL/min (ref >90)
GLUCOSE: 88 mg/dL (ref 70–99)
Potassium: 4 mEq/L (ref 3.7–5.3)
Sodium: 142 mEq/L (ref 137–147)
Total Protein: 7 g/dL (ref 6.0–8.3)

## 2013-11-11 LAB — CBC WITH DIFFERENTIAL/PLATELET
Basophils Absolute: 0 10*3/uL (ref 0.0–0.1)
Basophils Relative: 1 % (ref 0–1)
Eosinophils Absolute: 0.3 10*3/uL (ref 0.0–0.7)
Eosinophils Relative: 6 % — ABNORMAL HIGH (ref 0–5)
HEMATOCRIT: 34.8 % — AB (ref 39.0–52.0)
HEMOGLOBIN: 11.1 g/dL — AB (ref 13.0–17.0)
LYMPHS PCT: 42 % (ref 12–46)
Lymphs Abs: 2.3 10*3/uL (ref 0.7–4.0)
MCH: 29.2 pg (ref 26.0–34.0)
MCHC: 31.9 g/dL (ref 30.0–36.0)
MCV: 91.6 fL (ref 78.0–100.0)
MONO ABS: 0.4 10*3/uL (ref 0.1–1.0)
MONOS PCT: 8 % (ref 3–12)
Neutro Abs: 2.4 10*3/uL (ref 1.7–7.7)
Neutrophils Relative %: 43 % (ref 43–77)
Platelets: 227 10*3/uL (ref 150–400)
RBC: 3.8 MIL/uL — ABNORMAL LOW (ref 4.22–5.81)
RDW: 13.2 % (ref 11.5–15.5)
WBC: 5.5 10*3/uL (ref 4.0–10.5)

## 2013-11-11 LAB — URINALYSIS, ROUTINE W REFLEX MICROSCOPIC
Bilirubin Urine: NEGATIVE
Glucose, UA: NEGATIVE mg/dL
Hgb urine dipstick: NEGATIVE
KETONES UR: NEGATIVE mg/dL
Leukocytes, UA: NEGATIVE
Nitrite: NEGATIVE
PH: 5.5 (ref 5.0–8.0)
Protein, ur: NEGATIVE mg/dL
Specific Gravity, Urine: 1.01 (ref 1.005–1.030)
Urobilinogen, UA: 0.2 mg/dL (ref 0.0–1.0)

## 2013-11-11 LAB — LIPASE, BLOOD: Lipase: 29 U/L (ref 11–59)

## 2013-11-11 MED ORDER — DICYCLOMINE HCL 10 MG PO CAPS
20.0000 mg | ORAL_CAPSULE | Freq: Once | ORAL | Status: AC
  Administered 2013-11-11: 20 mg via ORAL
  Filled 2013-11-11: qty 2

## 2013-11-11 MED ORDER — KETOROLAC TROMETHAMINE 30 MG/ML IJ SOLN
30.0000 mg | Freq: Once | INTRAMUSCULAR | Status: AC
  Administered 2013-11-11: 30 mg via INTRAVENOUS
  Filled 2013-11-11: qty 1

## 2013-11-11 MED ORDER — DICYCLOMINE HCL 20 MG PO TABS: 20.0000 mg | ORAL_TABLET | Freq: Two times a day (BID) | ORAL | Status: DC

## 2013-11-11 MED ORDER — ONDANSETRON HCL 4 MG/2ML IJ SOLN
4.0000 mg | Freq: Once | INTRAMUSCULAR | Status: AC
  Administered 2013-11-11: 4 mg via INTRAVENOUS
  Filled 2013-11-11: qty 2

## 2013-11-11 MED ORDER — ONDANSETRON HCL 4 MG PO TABS: 4.0000 mg | ORAL_TABLET | Freq: Four times a day (QID) | ORAL | Status: DC

## 2013-11-11 NOTE — ED Provider Notes (Signed)
CSN: [id number]     Arrival date & time 11/11/13  0509 History   First MD Initiated Contact with Patient 11/11/13 0559     Chief Complaint  Patient presents with  . Abdominal Pain     (Consider location/radiation/quality/duration/timing/severity/associated sxs/prior Treatment) Patient is a 43 y.o. male presenting with abdominal pain. The history is provided by the patient and medical records.  Abdominal Pain Associated symptoms: nausea    This is a 43 year old male with past medical history significant for alcohol abuse, depression, anemia, previous colitis, presenting to the ED for right-sided abdominal pain. Patient states symptoms began approximately 2 days ago and have been intermittent in nature. He describes it as a sharp pain, making it difficult to find a comfortable position, mostly concentrated to right upper and lower abdomen.  He endorses nausea but denies vomiting. He has had a few loose stools, but no diarrhea. No melena or hematochezia. He is freely passing flatus.  No prior abdominal surgeries. No fever or chills. No urinary symptoms.  Pt states he has been battling with this on and off for several months now.  VS stable on arrival.  Past Medical History  Diagnosis Date  . Alcohol abuse   . Anemia   . Severe major depression with psychotic features   . Colitis    History reviewed. No pertinent past surgical history. Family History  Problem Relation Age of Onset  . Hypertension Mother   . Hypertension Father   . Hypertension Other    History  Substance Use Topics  . Smoking status: Current Every Day Smoker -- 0.25 packs/day    Types: Cigarettes  . Smokeless tobacco: Not on file  . Alcohol Use: No     Comment: former  last use 8 1/2 mths ago     Review of Systems  Gastrointestinal: Positive for nausea and abdominal pain.  All other systems reviewed and are negative.     Allergies  Sertraline  Home Medications   Prior to Admission medications    Medication Sig Start Date End Date Taking? Authorizing Provider  ibuprofen (ADVIL,MOTRIN) 200 MG tablet Take 400 mg by mouth every 6 (six) hours as needed for moderate pain.   Yes Historical Provider, MD   BP 131/81  Pulse 64  Temp(Src) 98.4 F (36.9 C) (Oral)  Resp 16  Ht 5\' 6"  (1.676 m)  Wt 145 lb (65.772 kg)  BMI 23.41 kg/m2  SpO2 95%  Physical Exam  Nursing note and vitals reviewed. Constitutional: He is oriented to person, place, and time. He appears well-developed and well-nourished.  Comfortably sleeping in bed, when awoken begins moaning and writhing in bed  HENT:  Head: Normocephalic and atraumatic.  Mouth/Throat: Oropharynx is clear and moist.  Eyes: Conjunctivae and EOM are normal. Pupils are equal, round, and reactive to light.  Neck: Normal range of motion.  Cardiovascular: Normal rate, regular rhythm and normal heart sounds.   Pulmonary/Chest: Effort normal and breath sounds normal.  Abdominal: Soft. Bowel sounds are normal. There is tenderness. There is no CVA tenderness.  Abdomen soft, nondistended, focal tenderness of right upper and lower quadrants without rebound or guarding, negative Murphy sign, no McBurney's point tenderness  Musculoskeletal: Normal range of motion.  Neurological: He is alert and oriented to person, place, and time.  Skin: Skin is warm and dry.  Psychiatric: He has a normal mood and affect.    ED Course  Procedures (including critical care time) Labs Review Labs Reviewed  CBC WITH DIFFERENTIAL -  Abnormal; Notable for the following:    RBC 3.80 (*)    Hemoglobin 11.1 (*)    HCT 34.8 (*)    Eosinophils Relative 6 (*)    All other components within normal limits  COMPREHENSIVE METABOLIC PANEL - Abnormal; Notable for the following:    Alkaline Phosphatase 32 (*)    All other components within normal limits  LIPASE, BLOOD  URINALYSIS, ROUTINE W REFLEX MICROSCOPIC    Imaging Review No results found.   EKG Interpretation None       MDM   Final diagnoses:  Abdominal pain, unspecified abdominal location   43 year old male presenting with right upper and lower abdominal pain. On review of medical records, has been seen multiple times in the past for similar complaints, single episode of colitis.  On exam today he is afebrile and overall nontoxic appearing. When entering room he is sleeping comfortably in bed, but when awoken he begins moaning and writhing in pain. He has tenderness of his right upper and lower quadrants without peritoneal signs. Specifically there is no Murphy sign or McBurney's point tenderness. Will obtain basic labs and urine. Toradol and Zofran given.  Labs reassuring-- no leukocytosis, no electrolyte imbalance.  U/a clean, no infection or blood to suggest kidney stones.  On repeat evaluation, pt is again sleeping comfortably in bed.  When awoken, he states he is feeling better.  He remains afebrile and non-toxic appearing.  Abdominal exam remains unchanged.  Given negative work-up today and improvement of sx while in the ED, i have low suspicion for acute or surgical abdominal pathology including, but not limited to, small bowel obstruction, diverticulitis, appendicitis, cholecystitis, choledocholithiasis, bowel perforation.  Feel patient can be safely discharged home with bentyl and zofran.  I have recommended that he follow-up with GI.  Discussed plan with patient, he/she acknowledged understanding and agreed with plan of care.  Return precautions given for new or worsening symptoms.  Garlon Hatchet, PA-C 11/11/13 4098  Garlon Hatchet, PA-C 11/11/13 [phone]

## 2013-11-11 NOTE — ED Notes (Signed)
C/O right mid abdominal region.  Nausea but no vomiting. Pain goes to back.

## 2013-11-11 NOTE — Discharge Instructions (Signed)
Take the prescribed medication as directed. Follow-up with GI-- call and schedule appt. Return to the ED for new or worsening symptoms.

## 2013-11-20 ENCOUNTER — Emergency Department (HOSPITAL_COMMUNITY)
Admission: EM | Admit: 2013-11-20 | Discharge: 2013-11-20 | Disposition: A | Discharge status: Home / Self Care | Payer: Self-pay | Attending: Emergency Medicine | Admitting: Emergency Medicine

## 2013-11-20 DIAGNOSIS — F17200 Nicotine dependence, unspecified, uncomplicated: Secondary | ICD-10-CM | POA: Insufficient documentation

## 2013-11-20 DIAGNOSIS — M545 Low back pain, unspecified: Secondary | ICD-10-CM | POA: Insufficient documentation

## 2013-11-20 DIAGNOSIS — Z79899 Other long term (current) drug therapy: Secondary | ICD-10-CM | POA: Insufficient documentation

## 2013-11-20 DIAGNOSIS — Z862 Personal history of diseases of the blood and blood-forming organs and certain disorders involving the immune mechanism: Secondary | ICD-10-CM | POA: Insufficient documentation

## 2013-11-20 DIAGNOSIS — G8929 Other chronic pain: Secondary | ICD-10-CM | POA: Insufficient documentation

## 2013-11-20 DIAGNOSIS — Z8719 Personal history of other diseases of the digestive system: Secondary | ICD-10-CM | POA: Insufficient documentation

## 2013-11-20 DIAGNOSIS — Z8659 Personal history of other mental and behavioral disorders: Secondary | ICD-10-CM | POA: Insufficient documentation

## 2013-11-20 MED ORDER — IBUPROFEN 800 MG PO TABS: 800.0000 mg | ORAL_TABLET | Freq: Three times a day (TID) | ORAL | Status: DC

## 2013-11-20 MED ORDER — TRAMADOL HCL 50 MG PO TABS: 50.0000 mg | ORAL_TABLET | Freq: Four times a day (QID) | ORAL | Status: DC | PRN

## 2013-11-20 MED ORDER — TRAMADOL HCL 50 MG PO TABS
50.0000 mg | ORAL_TABLET | Freq: Once | ORAL | Status: AC
  Administered 2013-11-20: 50 mg via ORAL
  Filled 2013-11-20: qty 1

## 2013-11-20 MED ORDER — IBUPROFEN 800 MG PO TABS
800.0000 mg | ORAL_TABLET | Freq: Once | ORAL | Status: AC
  Administered 2013-11-20: 800 mg via ORAL
  Filled 2013-11-20: qty 1

## 2013-11-20 NOTE — ED Notes (Addendum)
During patient assessment now says he came because he started having intermittent chest pains also starting yesterday afternoon.  States it may have been indigestion.

## 2013-11-20 NOTE — Discharge Instructions (Signed)

## 2013-11-20 NOTE — ED Notes (Signed)
Patient complaint of mid to lower back pain.  States pain moves around everywhere.  Patient states pain started early yesterday morning around 0800.
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
## 2013-11-20 NOTE — ED Provider Notes (Signed)
Medical screening examination/treatment/procedure(s) were performed by non-physician practitioner and as supervising physician I was immediately available for consultation/collaboration.   EKG Interpretation None        Veryl Abril, MD 11/20/13 0755 

## 2013-11-22 ENCOUNTER — Emergency Department (HOSPITAL_COMMUNITY)
Admission: EM | Admit: 2013-11-22 | Discharge: 2013-11-22 | Disposition: A | Discharge status: Home / Self Care | Payer: Self-pay | Attending: Emergency Medicine | Admitting: Emergency Medicine

## 2013-11-22 ENCOUNTER — Encounter (HOSPITAL_COMMUNITY): Payer: Self-pay | Admitting: Emergency Medicine

## 2013-11-22 DIAGNOSIS — M549 Dorsalgia, unspecified: Secondary | ICD-10-CM

## 2013-11-22 DIAGNOSIS — F17200 Nicotine dependence, unspecified, uncomplicated: Secondary | ICD-10-CM | POA: Insufficient documentation

## 2013-11-22 DIAGNOSIS — F323 Major depressive disorder, single episode, severe with psychotic features: Secondary | ICD-10-CM | POA: Insufficient documentation

## 2013-11-22 DIAGNOSIS — Z8719 Personal history of other diseases of the digestive system: Secondary | ICD-10-CM | POA: Insufficient documentation

## 2013-11-22 DIAGNOSIS — M545 Low back pain, unspecified: Secondary | ICD-10-CM | POA: Insufficient documentation

## 2013-11-22 DIAGNOSIS — Z791 Long term (current) use of non-steroidal anti-inflammatories (NSAID): Secondary | ICD-10-CM | POA: Insufficient documentation

## 2013-11-22 DIAGNOSIS — G8929 Other chronic pain: Secondary | ICD-10-CM | POA: Insufficient documentation

## 2013-11-22 DIAGNOSIS — Z862 Personal history of diseases of the blood and blood-forming organs and certain disorders involving the immune mechanism: Secondary | ICD-10-CM | POA: Insufficient documentation

## 2013-11-22 MED ORDER — IBUPROFEN 800 MG PO TABS
800.0000 mg | ORAL_TABLET | Freq: Once | ORAL | Status: AC
  Administered 2013-11-22: 800 mg via ORAL
  Filled 2013-11-22: qty 1

## 2013-11-22 NOTE — ED Provider Notes (Signed)
CSN: [id number]     Arrival date & time 11/22/13  0404 History   First MD Initiated Contact with Patient 11/22/13 0435     Chief Complaint  Patient presents with  . Flank Pain      HPI Patient reports ongoing sharp low back pain.  He denies fevers and chills.  No urinary complaints.  He states is intermittently had back pain for many years since a prior trauma.  He does not have a primary care physician.  No abdominal pain.  No chest pain.  No diarrhea.  Denies nausea vomiting.  History kidney stones.   Past Medical History  Diagnosis Date  . Alcohol abuse   . Anemia   . Severe major depression with psychotic features   . Colitis    History reviewed. No pertinent past surgical history. Family History  Problem Relation Age of Onset  . Hypertension Mother   . Hypertension Father   . Hypertension Other    History  Substance Use Topics  . Smoking status: Current Every Day Smoker -- 0.25 packs/day    Types: Cigarettes  . Smokeless tobacco: Not on file  . Alcohol Use: No     Comment: former  last use 8 1/2 mths ago     Review of Systems  All other systems reviewed and are negative.     Allergies  Sertraline  Home Medications   Prior to Admission medications   Medication Sig Start Date End Date Taking? Authorizing Provider  ibuprofen (ADVIL,MOTRIN) 800 MG tablet Take 1 tablet (800 mg total) by mouth 3 (three) times daily. 11/20/13  Yes Brandt LoosenJulie Manly, MD  traMADol (ULTRAM) 50 MG tablet Take 1 tablet (50 mg total) by mouth every 6 (six) hours as needed. 11/20/13  Yes Brandt LoosenJulie Manly, MD   BP 126/80  Pulse 71  Temp(Src) 98.3 F (36.8 C) (Oral)  Resp 18  Ht 5\' 7"  (1.702 m)  Wt 145 lb (65.772 kg)  BMI 22.71 kg/m2  SpO2 100% Physical Exam  Nursing note and vitals reviewed. Constitutional: He is oriented to person, place, and time. He appears well-developed and well-nourished.  HENT:  Head: Normocephalic and atraumatic.  Eyes: EOM are normal.  Neck: Normal range of  motion.  Cardiovascular: Normal rate, regular rhythm, normal heart sounds and intact distal pulses.   Pulmonary/Chest: Effort normal and breath sounds normal. No respiratory distress.  Abdominal: Soft. He exhibits no distension. There is no tenderness.  Musculoskeletal: Normal range of motion.  Reproducible low lumbar back pain.  No thoracic or lumbar tenderness.  No overlying skin changes.  Appears to be paralumbar muscles bilaterally.  No spasm noted.  Neurological: He is alert and oriented to person, place, and time.  Skin: Skin is warm and dry.  Psychiatric: He has a normal mood and affect. Judgment normal.    ED Course  Procedures (including critical care time) Labs Review Labs Reviewed - No data to display  Imaging Review No results found.   EKG Interpretation None      MDM   Final diagnoses:  Chronic back pain   Normal lower extremity neurologic exam. No bowel or bladder complaints. No back pain red flags. Likely musculoskeletal back pain. Doubt spinal epidural abscess. Doubt cauda equina. Doubt abdominal aortic aneurysm     Lyanne CoKevin M Anahlia Iseminger, MD 11/22/13 [phone]

## 2013-11-22 NOTE — ED Notes (Signed)
[place] [facility] [place]/o bilateral flank pain.  Pt reported that he has been having pain to both his flank sides since 1100 yesterday morning-- pain got progressively worse.  Pt denies N/V/D.

## 2013-11-22 NOTE — ED Notes (Signed)
Bed: WA10 Expected date: 11/22/13 Expected time: 3:57 AM Means of arrival: Ambulance Comments: Flank pain

## 2013-11-25 ENCOUNTER — Encounter (HOSPITAL_COMMUNITY): Payer: Self-pay | Admitting: Emergency Medicine

## 2013-11-25 ENCOUNTER — Emergency Department (HOSPITAL_COMMUNITY)
Admission: EM | Admit: 2013-11-25 | Discharge: 2013-11-25 | Disposition: A | Discharge status: Home / Self Care | Payer: Self-pay | Attending: Emergency Medicine | Admitting: Emergency Medicine

## 2013-11-25 DIAGNOSIS — M545 Low back pain, unspecified: Secondary | ICD-10-CM | POA: Insufficient documentation

## 2013-11-25 DIAGNOSIS — Z862 Personal history of diseases of the blood and blood-forming organs and certain disorders involving the immune mechanism: Secondary | ICD-10-CM | POA: Insufficient documentation

## 2013-11-25 DIAGNOSIS — F17200 Nicotine dependence, unspecified, uncomplicated: Secondary | ICD-10-CM | POA: Insufficient documentation

## 2013-11-25 DIAGNOSIS — M549 Dorsalgia, unspecified: Secondary | ICD-10-CM

## 2013-11-25 DIAGNOSIS — Z8659 Personal history of other mental and behavioral disorders: Secondary | ICD-10-CM | POA: Insufficient documentation

## 2013-11-25 DIAGNOSIS — Z8719 Personal history of other diseases of the digestive system: Secondary | ICD-10-CM | POA: Insufficient documentation

## 2013-11-25 DIAGNOSIS — G8929 Other chronic pain: Secondary | ICD-10-CM

## 2013-11-25 MED ORDER — LIDOCAINE 5 % EX PTCH
1.0000 | MEDICATED_PATCH | Freq: Once | CUTANEOUS | Status: DC
  Administered 2013-11-25: 1 via TRANSDERMAL
  Filled 2013-11-25 (×2): qty 1

## 2013-11-25 MED ORDER — IBUPROFEN 400 MG PO TABS
800.0000 mg | ORAL_TABLET | Freq: Once | ORAL | Status: DC
Start: 2013-11-25 — End: 2013-11-25
  Filled 2013-11-25: qty 2

## 2013-11-25 MED ORDER — DICLOFENAC SODIUM 50 MG PO TBEC: 50.0000 mg | DELAYED_RELEASE_TABLET | Freq: Two times a day (BID) | ORAL | Status: DC

## 2013-11-25 NOTE — Discharge Instructions (Signed)
Please follow up with your primary care physician and/or keep your appointment next week with a specialist to discuss her chronic back pain. Please discuss further pain management with one of these providers. Please take Voltaren as prescribed. Please read all discharge instructions and return precautions.   Back Pain, Adult Low back pain is very common. About 1 in 5 people have back pain.The cause of low back pain is rarely dangerous. The pain often gets better over time.About half of people with a sudden onset of back pain feel better in just 2 weeks. About 8 in 10 people feel better by 6 weeks.  CAUSES Some common causes of back pain include:  Strain of the muscles or ligaments supporting the spine.  Wear and tear (degeneration) of the spinal discs.  Arthritis.  Direct injury to the back. DIAGNOSIS Most of the time, the direct cause of low back pain is not known.However, back pain can be treated effectively even when the exact cause of the pain is unknown.Answering your caregiver's questions about your overall health and symptoms is one of the most accurate ways to make sure the cause of your pain is not dangerous. If your caregiver needs more information, he or she may order lab work or imaging tests (X-rays or MRIs).However, even if imaging tests show changes in your back, this usually does not require surgery. HOME CARE INSTRUCTIONS For many people, back pain returns.Since low back pain is rarely dangerous, it is often a condition that people can learn to [facility] their own.   Remain active. It is stressful on the back to sit or stand in one place. Do not sit, drive, or stand in one place for more than 30 minutes at a time. Take short walks on level surfaces as soon as pain allows.Try to increase the length of time you walk each day.  Do not stay in bed.Resting more than 1 or 2 days can delay your recovery.  Do not avoid exercise or work.Your body is made to move.It is not  dangerous to be active, even though your back may hurt.Your back will likely heal faster if you return to being active before your pain is gone.  Pay attention to your body when you bend and lift. Many people have less discomfortwhen lifting if they bend their knees, keep the load close to their bodies,and avoid twisting. Often, the most comfortable positions are those that put less stress on your recovering back.  Find a comfortable position to sleep. Use a firm mattress and lie on your side with your knees slightly bent. If you lie on your back, put a pillow under your knees.  Only take over-the-counter or prescription medicines as directed by your caregiver. Over-the-counter medicines to reduce pain and inflammation are often the most helpful.Your caregiver may prescribe muscle relaxant drugs.These medicines help dull your pain so you can more quickly return to your normal activities and healthy exercise.  Put ice on the injured area.  Put ice in a plastic bag.  Place a towel between your skin and the bag.  Leave the ice on for 15-20 minutes, 03-04 times a day for the first 2 to 3 days. After that, ice and heat may be alternated to reduce pain and spasms.  Ask your caregiver about trying back exercises and gentle massage. This may be of some benefit.  Avoid feeling anxious or stressed.Stress increases muscle tension and can worsen back pain.It is important to recognize when you are anxious or stressed and learn  ways to manage it.Exercise is a great option. SEEK MEDICAL CARE IF:  You have pain that is not relieved with rest or medicine.  You have pain that does not improve in 1 week.  You have new symptoms.  You are generally not feeling well. SEEK IMMEDIATE MEDICAL CARE IF:   You have pain that radiates from your back into your legs.  You develop new bowel or bladder control problems.  You have unusual weakness or numbness in your arms or legs.  You develop nausea or  vomiting.  You develop abdominal pain.  You feel faint. Document Released: 04/20/2005 Document Revised: 10/20/2011 Document Reviewed: 08/22/2013 [facility] Patient Information 2015 [place], [place]. This information is not intended to replace advice given to you by your health care provider. Make sure you discuss any questions you have with your health care provider.

## 2013-11-25 NOTE — ED Notes (Signed)
On  arrival to PT room Pt was sleeping soundly and had to be called multiple times to wake up. On waking Pt reported back pain to be 10/10 .

## 2013-11-25 NOTE — ED Notes (Signed)
Pt. reports generalized back pain for several days , denies injury , no urinary discomfort or hematuria . Ambulatory.

## 2013-11-25 NOTE — ED Notes (Signed)
Declined W/C at D/C and was escorted to lobby by RN. 

## 2013-11-25 NOTE — ED Provider Notes (Signed)
CSN: [id number]     Arrival date & time 11/25/13  0608 History   None    Chief Complaint  Patient presents with  . Back Pain     (Consider location/radiation/quality/duration/timing/severity/associated sxs/prior Treatment) HPI Comments: Patient is a 43 year old male past medical history significant for alcohol abuse, anemia, depression, chronic back pain presented to the emergency department for continued generalized low back pain since being seen in the emergency department on July 22. Patient states he has had low back pain since 2002. He has not followed up with a specialist, states he is due to see one next week. Denies any fevers, chills, bladder or bowel incontinence, numbness or weakness, new injuries or falls, history of cancer.   Patient is a 43 y.o. male presenting with back pain.  Back Pain Associated symptoms: no fever     Past Medical History  Diagnosis Date  . Alcohol abuse   . Anemia   . Severe major depression with psychotic features   . Colitis    History reviewed. No pertinent past surgical history. Family History  Problem Relation Age of Onset  . Hypertension Mother   . Hypertension Father   . Hypertension Other    History  Substance Use Topics  . Smoking status: Current Every Day Smoker -- 0.25 packs/day    Types: Cigarettes  . Smokeless tobacco: Not on file  . Alcohol Use: No     Comment: former  last use 8 1/2 mths ago     Review of Systems  Constitutional: Negative for fever and chills.  Musculoskeletal: Positive for back pain.  All other systems reviewed and are negative.     Allergies  Sertraline  Home Medications   Prior to Admission medications   Medication Sig Start Date End Date Taking? Authorizing Provider  diclofenac (VOLTAREN) 50 MG EC tablet Take 1 tablet (50 mg total) by mouth 2 (two) times daily. 11/25/13   Mahek Schlesinger L Shalunda Lindh, PA-C   BP 131/77  Pulse 62  Temp(Src) 98.1 F (36.7 C) (Oral)  Resp 14  SpO2 98% Physical  Exam  Nursing note and vitals reviewed. Constitutional: He is oriented to person, place, and time. He appears well-developed and well-nourished. No distress.  Patient asleep upon my entrance into room.   HENT:  Head: Normocephalic and atraumatic.  Right Ear: External ear normal.  Left Ear: External ear normal.  Nose: Nose normal.  Mouth/Throat: Uvula is midline, oropharynx is clear and moist and mucous membranes are normal. No oropharyngeal exudate.  Eyes: Conjunctivae and EOM are normal. Pupils are equal, round, and reactive to light.  Neck: Normal range of motion. Neck supple.  Cardiovascular: Normal rate, regular rhythm, normal heart sounds and intact distal pulses.   Pulmonary/Chest: Effort normal and breath sounds normal. No respiratory distress.  Abdominal: Soft. There is no tenderness.  Musculoskeletal: Normal range of motion.  Reproducible mildly tender low lumbar back pain.  No thoracic or lumbar bony tenderness.  No overlying skin changes. No spasm noted.    Neurological: He is alert and oriented to person, place, and time. He has normal strength. No cranial nerve deficit. Gait normal. GCS eye subscore is 4. GCS verbal subscore is 5. GCS motor subscore is 6.  Sensation grossly intact.  Moves all extremities w/o ataxia  Skin: Skin is warm and dry. He is not diaphoretic.    ED Course  Procedures (including critical care time) Medications  ibuprofen (ADVIL,MOTRIN) tablet 800 mg (not administered)  lidocaine (LIDODERM) 5 % 1  patch (not administered)    Labs Review Labs Reviewed - No data to display  Imaging Review No results found.   EKG Interpretation None      Patient declined medications, was upset by not receiving narcotic pain medications.  MDM   Final diagnoses:  Chronic back pain    Filed Vitals:   11/25/13 0610  BP: 131/77  Pulse: 62  Temp: 98.1 F (36.7 C)  Resp: 14   Afebrile, NAD, non-toxic appearing, AAOx4.  Patient with back pain. Multiple  visits to the emergency department for same complaints with negative workup.  No neurological deficits and normal neuro exam.  Patient can walk but states is painful.  No loss of bowel or bladder control.  No concern for cauda equina.  No fever, night sweats, weight loss, h/o cancer.  RICE protocol and NSAIDs indicated and discussed with patient.      Jeannetta EllisJennifer L Ryoma Nofziger, PA-C 11/25/13 [phone]

## 2013-11-25 NOTE — ED Provider Notes (Signed)
Medical screening examination/treatment/procedure(s) were performed by non-physician practitioner and as supervising physician I was immediately available for consultation/collaboration.   EKG Interpretation None        Joya Gaskinsonald W Vaun Hyndman, MD 11/25/13 [phone]

## 2013-12-15 ENCOUNTER — Encounter (HOSPITAL_COMMUNITY): Payer: Self-pay | Admitting: Emergency Medicine

## 2013-12-15 ENCOUNTER — Emergency Department (HOSPITAL_COMMUNITY)
Admission: EM | Admit: 2013-12-15 | Discharge: 2013-12-15 | Disposition: A | Discharge status: Home / Self Care | Payer: Self-pay | Attending: Emergency Medicine | Admitting: Emergency Medicine

## 2013-12-15 DIAGNOSIS — Z8719 Personal history of other diseases of the digestive system: Secondary | ICD-10-CM | POA: Insufficient documentation

## 2013-12-15 DIAGNOSIS — M545 Low back pain, unspecified: Secondary | ICD-10-CM | POA: Insufficient documentation

## 2013-12-15 DIAGNOSIS — Z862 Personal history of diseases of the blood and blood-forming organs and certain disorders involving the immune mechanism: Secondary | ICD-10-CM | POA: Insufficient documentation

## 2013-12-15 DIAGNOSIS — M549 Dorsalgia, unspecified: Secondary | ICD-10-CM

## 2013-12-15 DIAGNOSIS — G8929 Other chronic pain: Secondary | ICD-10-CM | POA: Insufficient documentation

## 2013-12-15 DIAGNOSIS — R079 Chest pain, unspecified: Secondary | ICD-10-CM | POA: Insufficient documentation

## 2013-12-15 DIAGNOSIS — Z8659 Personal history of other mental and behavioral disorders: Secondary | ICD-10-CM | POA: Insufficient documentation

## 2013-12-15 DIAGNOSIS — F17200 Nicotine dependence, unspecified, uncomplicated: Secondary | ICD-10-CM | POA: Insufficient documentation

## 2013-12-15 MED ORDER — DICLOFENAC SODIUM 50 MG PO TBEC: 50.0000 mg | DELAYED_RELEASE_TABLET | Freq: Two times a day (BID) | ORAL | Status: DC

## 2013-12-15 NOTE — ED Notes (Signed)
The pty is c/o lower back paIN AND RT RIB P[AIN FOR A LONG TIME

## 2013-12-15 NOTE — Discharge Instructions (Signed)
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
## 2013-12-15 NOTE — ED Notes (Signed)
Pt c/o pain in entire back from an old injury x's 2-3 days.  Also c/o pain in right rib area.

## 2013-12-16 NOTE — ED Provider Notes (Signed)
Medical screening examination/treatment/procedure(s) were performed by non-physician practitioner and as supervising physician I was immediately available for consultation/collaboration.   EKG Interpretation None       Doug SouSam Raider Valbuena, MD 12/16/13 [phone]

## 2014-01-11 ENCOUNTER — Emergency Department (HOSPITAL_COMMUNITY)
Admission: EM | Admit: 2014-01-11 | Discharge: 2014-01-11 | Disposition: A | Discharge status: Home / Self Care | Payer: Self-pay | Attending: Emergency Medicine | Admitting: Emergency Medicine

## 2014-01-11 DIAGNOSIS — Z8659 Personal history of other mental and behavioral disorders: Secondary | ICD-10-CM | POA: Insufficient documentation

## 2014-01-11 DIAGNOSIS — Z8719 Personal history of other diseases of the digestive system: Secondary | ICD-10-CM | POA: Insufficient documentation

## 2014-01-11 DIAGNOSIS — G8921 Chronic pain due to trauma: Secondary | ICD-10-CM | POA: Insufficient documentation

## 2014-01-11 DIAGNOSIS — F17200 Nicotine dependence, unspecified, uncomplicated: Secondary | ICD-10-CM | POA: Insufficient documentation

## 2014-01-11 DIAGNOSIS — Z862 Personal history of diseases of the blood and blood-forming organs and certain disorders involving the immune mechanism: Secondary | ICD-10-CM | POA: Insufficient documentation

## 2014-01-11 DIAGNOSIS — R52 Pain, unspecified: Secondary | ICD-10-CM | POA: Insufficient documentation

## 2014-01-11 DIAGNOSIS — M549 Dorsalgia, unspecified: Secondary | ICD-10-CM | POA: Insufficient documentation

## 2014-01-11 MED ORDER — CYCLOBENZAPRINE HCL 10 MG PO TABS
10.0000 mg | ORAL_TABLET | Freq: Once | ORAL | Status: AC
  Administered 2014-01-11: 10 mg via ORAL
  Filled 2014-01-11: qty 1

## 2014-01-11 MED ORDER — OXYCODONE-ACETAMINOPHEN 5-325 MG PO TABS
1.0000 | ORAL_TABLET | Freq: Once | ORAL | Status: AC
  Administered 2014-01-11: 1 via ORAL
  Filled 2014-01-11: qty 1

## 2014-01-11 MED ORDER — HYDROCODONE-ACETAMINOPHEN 7.5-325 MG/15ML PO SOLN: 15.0000 mL | ORAL | Status: DC | PRN

## 2014-01-11 MED ORDER — PREDNISONE 10 MG PO TABS: 20.0000 mg | ORAL_TABLET | Freq: Every day | ORAL | Status: DC

## 2014-01-11 MED ORDER — CYCLOBENZAPRINE HCL 10 MG PO TABS: 10.0000 mg | ORAL_TABLET | Freq: Two times a day (BID) | ORAL | Status: DC | PRN

## 2014-01-11 MED ORDER — PREDNISONE 20 MG PO TABS
40.0000 mg | ORAL_TABLET | Freq: Once | ORAL | Status: AC
  Administered 2014-01-11: 40 mg via ORAL
  Filled 2014-01-11: qty 2

## 2014-01-11 NOTE — Discharge Instructions (Signed)
It is important that you follow up for your chronic back pain  Call Dr. Greig Right office for follow up

## 2014-01-11 NOTE — ED Notes (Signed)
NP at BS.

## 2014-01-11 NOTE — ED Notes (Addendum)
Pt reports chronic back pain x 3 years, pt states back went out twice in the last 24 hrs. Pt denies numbness and tingling to lower extremities. Denies any urinary issues. States he is unable to walk due to discomfort.

## 2014-01-11 NOTE — ED Notes (Signed)
Pt and family member given bus passes.
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
## 2014-01-15 NOTE — ED Provider Notes (Signed)
Medical screening examination/treatment/procedure(s) were performed by non-physician practitioner and as supervising physician I was immediately available for consultation/collaboration.   EKG Interpretation None        Kristen N Ward, DO 01/15/14 1012 

## 2014-03-03 ENCOUNTER — Emergency Department (HOSPITAL_COMMUNITY)
Admission: EM | Admit: 2014-03-03 | Discharge: 2014-03-03 | Disposition: A | Discharge status: Home / Self Care | Payer: Self-pay | Attending: Emergency Medicine | Admitting: Emergency Medicine

## 2014-03-03 ENCOUNTER — Encounter (HOSPITAL_COMMUNITY): Payer: Self-pay | Admitting: Emergency Medicine

## 2014-03-03 DIAGNOSIS — Z7952 Long term (current) use of systemic steroids: Secondary | ICD-10-CM | POA: Insufficient documentation

## 2014-03-03 DIAGNOSIS — Z862 Personal history of diseases of the blood and blood-forming organs and certain disorders involving the immune mechanism: Secondary | ICD-10-CM | POA: Insufficient documentation

## 2014-03-03 DIAGNOSIS — Z79899 Other long term (current) drug therapy: Secondary | ICD-10-CM | POA: Insufficient documentation

## 2014-03-03 DIAGNOSIS — M546 Pain in thoracic spine: Secondary | ICD-10-CM | POA: Insufficient documentation

## 2014-03-03 DIAGNOSIS — G8929 Other chronic pain: Secondary | ICD-10-CM | POA: Insufficient documentation

## 2014-03-03 DIAGNOSIS — Z8659 Personal history of other mental and behavioral disorders: Secondary | ICD-10-CM | POA: Insufficient documentation

## 2014-03-03 DIAGNOSIS — M549 Dorsalgia, unspecified: Secondary | ICD-10-CM

## 2014-03-03 DIAGNOSIS — Z8719 Personal history of other diseases of the digestive system: Secondary | ICD-10-CM | POA: Insufficient documentation

## 2014-03-03 MED ORDER — HYDROCODONE-ACETAMINOPHEN 5-325 MG PO TABS
1.0000 | ORAL_TABLET | Freq: Once | ORAL | Status: AC
  Administered 2014-03-03: 1 via ORAL
  Filled 2014-03-03: qty 1

## 2014-03-03 MED ORDER — PREDNISONE 10 MG PO TABS: 20.0000 mg | ORAL_TABLET | Freq: Every day | ORAL | Status: DC

## 2014-03-03 MED ORDER — HYDROCODONE-ACETAMINOPHEN 5-325 MG PO TABS: 1.0000 | ORAL_TABLET | Freq: Four times a day (QID) | ORAL | Status: DC | PRN
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
## 2014-03-03 NOTE — Discharge Instructions (Signed)
Follow up with dr. Lodema HongSimpson in 1-2 weeks
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
## 2014-03-03 NOTE — ED Notes (Signed)
Pt lying flat on his back on stretcher, able sit straight up without difficulty to take his PO  pain medication. Smiles broadly when he tells me his pain is a "10" "I know I only got 1-10 but I told the girl out there I was a 12". Odor of ETOH on his breath. States he is not driving.

## 2014-03-26 ENCOUNTER — Encounter (HOSPITAL_COMMUNITY): Payer: Self-pay | Admitting: Family Medicine

## 2014-03-26 ENCOUNTER — Emergency Department (HOSPITAL_COMMUNITY)
Admission: EM | Admit: 2014-03-26 | Discharge: 2014-03-26 | Disposition: A | Discharge status: Home / Self Care | Payer: Self-pay | Attending: Emergency Medicine | Admitting: Emergency Medicine

## 2014-03-26 DIAGNOSIS — Y9289 Other specified places as the place of occurrence of the external cause: Secondary | ICD-10-CM | POA: Insufficient documentation

## 2014-03-26 DIAGNOSIS — Y9389 Activity, other specified: Secondary | ICD-10-CM | POA: Insufficient documentation

## 2014-03-26 DIAGNOSIS — S24109A Unspecified injury at unspecified level of thoracic spinal cord, initial encounter: Secondary | ICD-10-CM | POA: Insufficient documentation

## 2014-03-26 DIAGNOSIS — Z8659 Personal history of other mental and behavioral disorders: Secondary | ICD-10-CM | POA: Insufficient documentation

## 2014-03-26 DIAGNOSIS — W2209XA Striking against other stationary object, initial encounter: Secondary | ICD-10-CM | POA: Insufficient documentation

## 2014-03-26 DIAGNOSIS — M549 Dorsalgia, unspecified: Secondary | ICD-10-CM

## 2014-03-26 DIAGNOSIS — Z720 Tobacco use: Secondary | ICD-10-CM | POA: Insufficient documentation

## 2014-03-26 DIAGNOSIS — Y998 Other external cause status: Secondary | ICD-10-CM | POA: Insufficient documentation

## 2014-03-26 DIAGNOSIS — M62830 Muscle spasm of back: Secondary | ICD-10-CM | POA: Insufficient documentation

## 2014-03-26 DIAGNOSIS — Z7952 Long term (current) use of systemic steroids: Secondary | ICD-10-CM | POA: Insufficient documentation

## 2014-03-26 DIAGNOSIS — Z862 Personal history of diseases of the blood and blood-forming organs and certain disorders involving the immune mechanism: Secondary | ICD-10-CM | POA: Insufficient documentation

## 2014-03-26 DIAGNOSIS — G8929 Other chronic pain: Secondary | ICD-10-CM | POA: Insufficient documentation

## 2014-03-26 DIAGNOSIS — Z8719 Personal history of other diseases of the digestive system: Secondary | ICD-10-CM | POA: Insufficient documentation

## 2014-03-26 MED ORDER — HYDROCODONE-ACETAMINOPHEN 5-325 MG PO TABS: 1.0000 | ORAL_TABLET | Freq: Four times a day (QID) | ORAL | Status: DC | PRN

## 2014-03-26 MED ORDER — CYCLOBENZAPRINE HCL 10 MG PO TABS: 10.0000 mg | ORAL_TABLET | Freq: Three times a day (TID) | ORAL | Status: DC | PRN

## 2014-03-26 MED ORDER — KETOROLAC TROMETHAMINE 30 MG/ML IJ SOLN
30.0000 mg | Freq: Once | INTRAMUSCULAR | Status: AC
  Administered 2014-03-26: 30 mg via INTRAMUSCULAR
  Filled 2014-03-26: qty 1

## 2014-03-26 MED ORDER — NAPROXEN 500 MG PO TABS: 500.0000 mg | ORAL_TABLET | Freq: Two times a day (BID) | ORAL | Status: DC | PRN

## 2014-03-26 NOTE — ED Provider Notes (Signed)
CSN: [id number]     Arrival date & time 03/26/14  1229 History  This chart was scribed for non-physician practitioner, Allen DerryMercedes Camprubi-Soms, PA-C, working with Samuel JesterKathleen McManus, DO by Charline BillsEssence Howell, ED Scribe. This patient was seen in room TR07C/TR07C and the patient's care was started at 1:09 PM.   Chief Complaint  Patient presents with  . Back Pain   Patient is a 43 y.o. male presenting with back pain. The history is provided by the patient. No language interpreter was used.  Back Pain Location:  Thoracic spine Quality:  Stabbing and aching Radiates to:  Does not radiate Pain severity:  Moderate Pain is:  Same all the time Onset quality:  Sudden Timing:  Constant Progression:  Unchanged Chronicity:  Chronic Context: recent injury   Relieved by:  Nothing Worsened by:  Bending and twisting Ineffective treatments:  Heating pad Associated symptoms: no abdominal pain, no bladder incontinence, no bowel incontinence, no chest pain, no dysuria, no fever, no headaches, no leg pain, no numbness, no perianal numbness, no tingling and no weakness    HPI Comments: [name] is a 43 y.o. male, with a h/o chronic back pain and alcohol abuse, who presents to the Emergency Department complaining of constant mid to lower back pain onset this morning. Pt states that his back "went out" on him this morning and he hit his back on the edge of the table. He describes his pain as a sharp, stabbing sensation that he currently rates his pain 10/10, located in mid-to-lower back, nonradiating. Pain is exacerbated with walking and bending. Pt tried a heating pad without relief. No other meds tried. He denies numbness/tingling in lower extremities or groin, urinary or bowel incontinence, cauda equina symptoms, flank pain, dysuria, hematuria, malodorous urine, color change in urine, fever, chest pain, SOB, abdominal pain, nausea, vomiting, weakness, hitting his head, LOC, HA, neck pain.  No h/o IV drug use or  hx of CA. Allergy to ibuprofen.   Past Medical History  Diagnosis Date  . Alcohol abuse   . Anemia   . Severe major depression with psychotic features   . Colitis    History reviewed. No pertinent past surgical history. Family History  Problem Relation Age of Onset  . Hypertension Mother   . Hypertension Father   . Hypertension Other    History  Substance Use Topics  . Smoking status: Current Every Day Smoker -- 0.25 packs/day    Types: Cigarettes  . Smokeless tobacco: Not on file  . Alcohol Use: No     Comment: former  last use1 year ago    Review of Systems  Constitutional: Negative for fever.  Respiratory: Negative for shortness of breath.   Cardiovascular: Negative for chest pain.  Gastrointestinal: Negative for nausea, vomiting, abdominal pain and bowel incontinence.  Genitourinary: Negative for bladder incontinence and dysuria.  Musculoskeletal: Positive for back pain. Negative for neck pain.  Neurological: Negative for tingling, syncope, weakness, numbness and headaches.  10 Systems reviewed and all are negative for acute change except as noted in the HPI.  Allergies  Aspirin; Ibuprofen; Pork-derived products; and Sertraline  Home Medications   Prior to Admission medications   Medication Sig Start Date End Date Taking? Authorizing Provider  HYDROcodone-acetaminophen (NORCO/VICODIN) 5-325 MG per tablet Take 1 tablet by mouth every 6 (six) hours as needed. 03/03/14   Benny LennertJoseph L Zammit, MD  IRON PO Take 1 tablet by mouth daily.    Historical Provider, MD  predniSONE (DELTASONE) 10 MG tablet  Take 2 tablets (20 mg total) by mouth daily. 03/03/14   Benny LennertJoseph L Zammit, MD   Triage Vitals: BP 143/90 mmHg  Pulse 85  Temp(Src) 98.2 F (36.8 C) (Oral)  Resp 18  SpO2 99% Physical Exam  Constitutional: He is oriented to person, place, and time. Vital signs are normal. He appears well-developed and well-nourished.  Non-toxic appearance. No distress.  Afebrile, nontoxic, NAD   HENT:  Head: Normocephalic and atraumatic.  Mouth/Throat: Mucous membranes are normal.  Eyes: Conjunctivae and EOM are normal. Right eye exhibits no discharge. Left eye exhibits no discharge.  Neck: Normal range of motion. Neck supple. No spinous process tenderness and no muscular tenderness present. No rigidity. Normal range of motion present.  FROM intact without spinous process or paraspinous muscle TTP, no bony stepoffs or deformities, no muscle spasms. No rigidity or meningeal signs. No bruising or swelling.   Cardiovascular: Normal rate and intact distal pulses.   Distal pulses intact  Pulmonary/Chest: Effort normal. No respiratory distress.  Abdominal: Soft. Normal appearance. He exhibits no distension. There is no tenderness. There is no rigidity, no rebound, no guarding and no CVA tenderness.  Soft, NTND, no r/g/r, no CVA TTP   Musculoskeletal: Normal range of motion.       Thoracic back: He exhibits pain and spasm. He exhibits normal range of motion, no tenderness and no bony tenderness.  MAE x4 Ambulatory with steady gait, unassisted, no deficits.  Midthoracic spine with R sided TTP to muscles with spasm. FROM intact. No bruising or deformities, no midline step offs or TTP. Strength 5/5 in all extremities, sensation grossly intact in all extremities, distal pulses intact.  Neurological: He is alert and oriented to person, place, and time. He has normal strength. No sensory deficit. Gait normal.  Skin: Skin is warm, dry and intact. No bruising noted. No erythema.  No bruising or abrasions  Psychiatric: He has a normal mood and affect. His behavior is normal.  Nursing note and vitals reviewed.  ED Course  Procedures (including critical care time) DIAGNOSTIC STUDIES: Oxygen Saturation is 99% on RA, normal by my interpretation.    COORDINATION OF CARE: 1:19 PM-Discussed treatment plan which includes heat, Naproxen, Flexeril and medication for pain with pt at bedside and pt agreed  to plan.   Labs Review Labs Reviewed - No data to display  Imaging Review No results found.   EKG Interpretation None      MDM   Final diagnoses:  Chronic back pain  Muscle spasm of back    43y/o male with acute on chronic back pain. Seen in ED multiple times in recent months with similar history and complaints of hitting his back on an object/surface. No red flag s/s of low back pain. No s/s of central cord compression or cauda equina. Lower extremities are neurovascularly intact and patient is ambulating without difficulty. Doubt need for imaging given no bony TTP, all mostly muscular and spasms. No urinary complaints, and no CVA TTP, afebrile, doubt pyelo or UTI. Toradol provided for pain relief here due to concern of drug seeking behaviors.  Patient was counseled on back pain precautions and told to do activity as tolerated but do not lift, push, or pull heavy objects more than 10 pounds for the next week. Patient counseled to use ice or heat on back for no longer than 15 minutes every hour.   Rx given for muscle relaxer and counseled on proper use of muscle relaxant medication. Rx given for very small  quantity of narcotic pain medicine and counseled on proper use of narcotic pain medications. Told that they can increase to every 4 hrs if needed while pain is worse. Counseled not to combine this medication with others containing tylenol. Urged patient not to drink alcohol, drive, or perform any other activities that requires focus while taking either of these medications.   Patient urged to follow-up with PCP if pain does not improve with treatment and rest or if pain becomes recurrent. Urged to return with worsening severe pain, loss of bowel or bladder control, trouble walking. The patient verbalizes understanding and agrees with the plan.   I personally performed the services described in this documentation, which was scribed in my presence. The recorded information has been  reviewed and is accurate.  BP 143/90 mmHg  Pulse 85  Temp(Src) 98.2 F (36.8 C) (Oral)  Resp 18  SpO2 99%  Meds ordered this encounter  Medications  . ketorolac (TORADOL) 30 MG/ML injection 30 mg    Sig:   . HYDROcodone-acetaminophen (NORCO) 5-325 MG per tablet    Sig: Take 1-2 tablets by mouth every 6 (six) hours as needed for severe pain.    Dispense:  6 tablet    Refill:  0    Order Specific Question:  Supervising Provider    Answer:  Eber Hong D [[id number]]  . naproxen (NAPROSYN) 500 MG tablet    Sig: Take 1 tablet (500 mg total) by mouth 2 (two) times daily as needed for mild pain, moderate pain or headache (TAKE WITH MEALS.).    Dispense:  20 tablet    Refill:  0    Order Specific Question:  Supervising Provider    Answer:  Eber Hong D [[id number]]  . cyclobenzaprine (FLEXERIL) 10 MG tablet    Sig: Take 1 tablet (10 mg total) by mouth 3 (three) times daily as needed for muscle spasms.    Dispense:  15 tablet    Refill:  0    Order Specific Question:  Supervising Provider    Answer:  Eber Hong D [[id number]]     [place] Camprubi-Soms, PA-C 03/26/14 1335  Samuel Jester, DO 03/27/14 1644

## 2014-03-26 NOTE — Discharge Instructions (Signed)
Back Pain:  Your back pain should be treated with medicines such as ibuprofen or aleve and this back pain should get better over the next 2 weeks.  However if you develop severe or worsening pain, low back pain with fever, numbness, weakness or inability to walk or urinate, you should return to the ER immediately.  Please follow up with your doctor this week for a recheck if still having symptoms.  Low back pain is discomfort in the lower back that may be due to injuries to muscles and ligaments around the spine.  Occasionally, it may be caused by a a problem to a part of the spine called a disc.  The pain may last several days or a week;  However, most patients get completely well in 4 weeks.  Self - care:  The application of heat can help soothe the pain.  Maintaining your daily activities, including walking, is encourged, as it will help you get better faster than just staying in bed. Perform gentle stretching as discussed. Drink plenty of fluids.  Medications are also useful to help with pain control.  A commonly prescribed medication includes norco.  Do not drive or operate heavy machinery while taking this medication.  Non steroidal anti inflammatory medications including Ibuprofen and naproxen;  These medications help both pain and swelling and are very useful in treating back pain.  They should be taken with food, as they can cause stomach upset, and more seriously, stomach bleeding.    Muscle relaxants:  These medications can help with muscle tightness that is a cause of lower back pain.  Most of these medications can cause drowsiness, and it is not safe to drive or use dangerous machinery while taking them.  SEEK IMMEDIATE MEDICAL ATTENTION IF: New numbness, tingling, weakness, or problem with the use of your arms or legs.  Severe back pain not relieved with medications.  Difficulty with or loss of control of your bowel or bladder control.  Increasing pain in any areas of the body (such  as chest or abdominal pain).  Shortness of breath, dizziness or fainting.  Nausea (feeling sick to your stomach), vomiting, fever, or sweats.  You will need to follow up with  Your primary healthcare provider in 1-2 weeks for reassessment.  If you do not have a doctor see the list below.  Emergency Department Resource Guide 1) Find a Doctor and Pay Out of Pocket Although you won't have to find out who is covered by your insurance plan, it is a good idea to ask around and get recommendations. You will then need to call the office and see if the doctor you have chosen will accept you as a new patient and what types of options they offer for patients who are self-pay. Some doctors offer discounts or will set up payment plans for their patients who do not have insurance, but you will need to ask so you aren't surprised when you get to your appointment.  2) Contact Your Local Health Department Not all health departments have doctors that can see patients for sick visits, but many do, so it is worth a call to see if yours does. If you don't know where your local health department is, you can check in your phone book. The CDC also has a tool to help you locate your state's health department, and many state websites also have listings of all of their local health departments.  3) Find a [facility] If your illness is not likely  to be very severe or complicated, you may want to try a walk in clinic. These are popping up all over the country in pharmacies, drugstores, and shopping centers. They're usually staffed by nurse practitioners or physician assistants that have been trained to treat common illnesses and complaints. They're usually fairly quick and inexpensive. However, if you have serious medical issues or chronic medical problems, these are probably not your best option.  No Primary Care Doctor: - Call Health Connect at  [phone] - they can help you locate a primary care doctor that  accepts  your insurance, provides certain services, etc. - Physician Referral Service- [phone]  Chronic Pain Problems: Organization         Address  Phone   Notes  [facility]  [phone] Patients need to be referred by their primary care doctor.   Medication Assistance: Organization         Address  Phone   Notes  [facility] Medication [facility] [address] [place], [place] [address] [phone] --Must be a resident of [facility] -- Must have NO insurance coverage whatsoever (no Medicaid/ Medicare, etc.) -- The pt. MUST have a primary care doctor that directs their care regularly and follows them in the community   [facility]  [phone]   [employer]  [phone]    Agencies that provide inexpensive medical care: Organization         Address  Phone   Notes  [place]  [phone]   [facility]    [phone]   [facility] [place],  [address] [phone]   [place] [address] [place] [phone]   [facility]    [phone]   [facility]    [phone]   [place] and [address], [place] Phone:  [phone], Fax:  [phone] Hours of Operation:  9 am - 6 pm, M-F.  Also accepts Medicaid/Medicare and self-pay.  [facility] for [address], [address], [place] Phone: [phone], Fax: [phone]. Hours of Operation:  8:30 am - 5:30 pm, M-F.  Also accepts Medicaid and self-pay.  [facility] [place] [address], [place] Phone: [phone]   [place], [place], [place] [phone], Ext. [phone] Mondays & Thursdays: 7-9 AM.  First 15 patients are seen on a first come, first serve basis.    [place] Providers:  Organization          Address  Phone   Notes  [facility] [address], [place] [phone] Also accepts self-pay patients.  [facility] [address], [place]  [phone]   [place], [address], [place] [phone]   [facility] [address] [place] [phone]   Lucianne Lei [address], [place]   [phone] Only accepts [place] [facility] [place] patients after they have their name applied to their card.   Self-Pay (no insurance) in [facility]:  Organization         Address  Phone   Notes  Sickle Cell Patients, [facility] [place] [phone]   [facility] [place] [phone]   [facility] [place]  [place], [place], [place] [phone]   [facility]/Dr. Osei-Bonsu  [address] , [place] or [address], [place] [phone] Phone number for both [place] and [place] locations is the same.  [facility]gent [facility] and [facility] [address], [place] [phone]   [facility] [address], [place] or [address] [phone]   [facility] [address] , [place] [phone], phone; [phone], fax Sees patients 1st and 3rd Saturday of every month.  Must not qualify for public or private insurance (i.e. Medicaid, Medicare, [place] [employer] Choice, Veterans' Benefits)  Household income should be no more than 200% of the poverty level The clinic cannot treat you if you are pregnant or think you are pregnant  Sexually transmitted diseases are not treated at the clinic.    Chronic Back Pain  When back pain lasts longer than 3 months, it is called chronic back pain.People with chronic back pain often go through certain  periods that are more intense (flare-ups).  CAUSES Chronic back pain can be caused by wear and tear (degeneration) on different structures in your back. These structures include:  The bones of your spine (vertebrae) and the joints surrounding your spinal cord and nerve roots (facets).  The strong, fibrous tissues that connect your vertebrae (ligaments). Degeneration of these structures may result in pressure on your nerves. This can lead to constant pain. HOME CARE INSTRUCTIONS  Avoid bending, heavy lifting, prolonged sitting, and activities which make the problem worse.  Take brief periods of rest throughout the day to reduce your pain. Lying down or standing usually is better than sitting while you are resting.  Take over-the-counter or prescription medicines only as directed by your caregiver. SEEK IMMEDIATE MEDICAL CARE IF:   You have weakness or numbness in one of your legs or feet.  You have trouble controlling your bladder or bowels.  You have nausea, vomiting, abdominal pain, shortness of breath, or fainting. Document Released: 05/28/2004 Document Revised: 07/13/2011 Document Reviewed: 04/04/2011 [facility] Patient Information 2015 [place], [place]. This information is not intended to replace advice given to you by your health care provider. Make sure you discuss any questions you have with your health care provider.  Back Injury Prevention The following tips can help you to prevent a back injury. PHYSICAL FITNESS  Exercise often. Try to develop strong stomach (abdominal) muscles.  Do aerobic exercises often. This includes walking, jogging, biking, swimming.  Do exercises that help with balance and strength often. This includes tai chi and yoga.  Stretch before and after you exercise.  Keep a healthy weight. DIET   Ask your doctor how much calcium and vitamin D you need every day.  Include calcium in your diet. Foods high in calcium include dairy products; green, leafy  vegetables; and products with calcium added (fortified).  Include vitamin D in your diet. Foods high in vitamin D include milk and products with vitamin D added.  Think about taking a multivitamin or other nutritional products called " supplements."  Stop smoking if you smoke. POSTURE   Sit and stand up straight. Avoid leaning forward or hunching over.  Choose chairs that support your lower back.  If you work at a desk:  Sit close to your work so you do not lean over.  Keep  your chin tucked in.  Keep your neck drawn back.  Keep your elbows bent at a right angle. Your arms should look like the letter "L."  Sit high and close to the steering wheel when you drive. Add low back support to your car seat if needed.  Avoid sitting or standing in one position for too long. Get up and move around every hour. Take breaks if you are driving for a long time.  Sleep on your side with your knees slightly bent. You can also sleep on your back with a pillow under your knees. Do not sleep on your stomach. LIFTING, TWISTING, AND REACHING  Avoid heavy lifting, especially lifting over and over again. If you must do heavy lifting:  Stretch before lifting.  Work slowly.  Rest between lifts.  Use carts and dollies to move objects when possible.  Make several small trips instead of carrying 1 heavy load.  Ask for help when you need it.  Ask for help when moving big, awkward objects.  Follow these steps when lifting:  Stand with your feet shoulder-width apart.  Get as close to the object as you can. Do not pick up heavy objects that are far from your body.  Use handles or lifting straps when possible.  Bend at your knees. Squat down, but keep your heels off the floor.  Keep your shoulders back, your chin tucked in, and your back straight.  Lift the object slowly. Tighten the muscles in your legs, stomach, and butt. Keep the object as close to the center of your body as  possible.  Reverse these directions when you put a load down.  Do not:  Lift the object above your waist.  Twist at the waist while lifting or carrying a load. Move your feet if you need to turn, not your waist.  Bend over without bending at your knees.  Avoid reaching over your head, across a table, or for an object on a high surface. OTHER TIPS  Avoid wet floors and keep sidewalks clear of ice.  Do not sleep on a mattress that is too soft or too hard.  Keep items that you use often within easy reach.  Put heavier objects on shelves at waist level. Put lighter objects on lower or higher shelves.  Find ways to lessen your stress. You can try exercise, massage, or relaxation.  Get help for depression or anxiety if needed. GET HELP IF:  You injure your back.  You have questions about diet, exercise, or other ways to prevent back injuries. MAKE SURE YOU:  Understand these instructions.  Will watch your condition.  Will get help right away if you are not doing well or get worse. Document Released: 10/07/2007 Document Revised: 07/13/2011 Document Reviewed: 06/01/2011 [facility] Patient Information 2015 [place], [place]. This information is not intended to replace advice given to you by your health care provider. Make sure you discuss any questions you have with your health care provider.  Back Exercises Back exercises help treat and prevent back injuries. The goal of back exercises is to increase the strength of your abdominal and back muscles and the flexibility of your back. These exercises should be started when you no longer have back pain. Back exercises include:  Pelvic Tilt. Lie on your back with your knees bent. Tilt your pelvis until the lower part of your back is against the floor. Hold this position 5 to 10 sec and repeat 5 to 10 times.  Knee to Chest. Pull  first 1 knee up against your chest and hold for 20 to 30 seconds, repeat this with the other knee, and then both  knees. This may be done with the other leg straight or bent, whichever feels better.  Sit-Ups or Curl-Ups. Bend your knees 90 degrees. Start with tilting your pelvis, and do a partial, slow sit-up, lifting your trunk only 30 to 45 degrees off the floor. Take at least 2 to 3 seconds for each sit-up. Do not do sit-ups with your knees out straight. If partial sit-ups are difficult, simply do the above but with only tightening your abdominal muscles and holding it as directed.  Hip-Lift. Lie on your back with your knees flexed 90 degrees. Push down with your feet and shoulders as you raise your hips a couple inches off the floor; hold for 10 seconds, repeat 5 to 10 times.  Back arches. Lie on your stomach, propping yourself up on bent elbows. Slowly press on your hands, causing an arch in your low back. Repeat 3 to 5 times. Any initial stiffness and discomfort should lessen with repetition over time.  Shoulder-Lifts. Lie face down with arms beside your body. Keep hips and torso pressed to floor as you slowly lift your head and shoulders off the floor. Do not overdo your exercises, especially in the beginning. Exercises may cause you some mild back discomfort which lasts for a few minutes; however, if the pain is more severe, or lasts for more than 15 minutes, do not continue exercises until you see your caregiver. Improvement with exercise therapy for back problems is slow.  See your caregivers for assistance with developing a proper back exercise program. Document Released: 05/28/2004 Document Revised: 07/13/2011 Document Reviewed: 02/19/2011 [facility] Patient Information 2015 [place], [place]. This information is not intended to replace advice given to you by your health care provider. Make sure you discuss any questions you have with your health care provider.

## 2014-03-26 NOTE — ED Notes (Signed)
Per pt sts his back gave out on him this am and he hit back on the table. sts hx of chronic back pain pain since 2001. sts swelling in back.

## 2014-03-26 NOTE — ED Notes (Signed)
Declined W/C at D/C and was escorted to lobby by RN. 

## 2014-04-05 ENCOUNTER — Encounter (HOSPITAL_COMMUNITY): Payer: Self-pay | Admitting: *Deleted

## 2014-04-05 ENCOUNTER — Emergency Department (HOSPITAL_COMMUNITY)
Admission: EM | Admit: 2014-04-05 | Discharge: 2014-04-05 | Disposition: A | Discharge status: Home / Self Care | Payer: Self-pay | Attending: Emergency Medicine | Admitting: Emergency Medicine

## 2014-04-05 DIAGNOSIS — W06XXXA Fall from bed, initial encounter: Secondary | ICD-10-CM | POA: Insufficient documentation

## 2014-04-05 DIAGNOSIS — Z862 Personal history of diseases of the blood and blood-forming organs and certain disorders involving the immune mechanism: Secondary | ICD-10-CM | POA: Insufficient documentation

## 2014-04-05 DIAGNOSIS — Z8719 Personal history of other diseases of the digestive system: Secondary | ICD-10-CM | POA: Insufficient documentation

## 2014-04-05 DIAGNOSIS — Y92092 Bedroom in other non-institutional residence as the place of occurrence of the external cause: Secondary | ICD-10-CM | POA: Insufficient documentation

## 2014-04-05 DIAGNOSIS — Y998 Other external cause status: Secondary | ICD-10-CM | POA: Insufficient documentation

## 2014-04-05 DIAGNOSIS — Z8659 Personal history of other mental and behavioral disorders: Secondary | ICD-10-CM | POA: Insufficient documentation

## 2014-04-05 DIAGNOSIS — Y9389 Activity, other specified: Secondary | ICD-10-CM | POA: Insufficient documentation

## 2014-04-05 DIAGNOSIS — Z7952 Long term (current) use of systemic steroids: Secondary | ICD-10-CM | POA: Insufficient documentation

## 2014-04-05 DIAGNOSIS — S3992XA Unspecified injury of lower back, initial encounter: Secondary | ICD-10-CM | POA: Insufficient documentation

## 2014-04-05 DIAGNOSIS — G8929 Other chronic pain: Secondary | ICD-10-CM | POA: Insufficient documentation

## 2014-04-05 DIAGNOSIS — M549 Dorsalgia, unspecified: Secondary | ICD-10-CM

## 2014-04-05 DIAGNOSIS — Z720 Tobacco use: Secondary | ICD-10-CM | POA: Insufficient documentation

## 2014-04-05 DIAGNOSIS — Z79899 Other long term (current) drug therapy: Secondary | ICD-10-CM | POA: Insufficient documentation

## 2014-04-05 MED ORDER — ACETAMINOPHEN 500 MG PO TABS
1000.0000 mg | ORAL_TABLET | Freq: Once | ORAL | Status: DC
  Filled 2014-04-05: qty 2

## 2014-04-05 MED ORDER — METHOCARBAMOL 500 MG PO TABS: 1000.0000 mg | ORAL_TABLET | Freq: Two times a day (BID) | ORAL | Status: DC

## 2014-04-05 MED ORDER — METHOCARBAMOL 500 MG PO TABS
1000.0000 mg | ORAL_TABLET | Freq: Once | ORAL | Status: AC
  Administered 2014-04-05: 1000 mg via ORAL
  Filled 2014-04-05: qty 2

## 2014-04-05 MED ORDER — TRAMADOL HCL 50 MG PO TABS
50.0000 mg | ORAL_TABLET | Freq: Once | ORAL | Status: AC
  Administered 2014-04-05: 50 mg via ORAL
  Filled 2014-04-05: qty 1

## 2014-04-05 NOTE — ED Provider Notes (Signed)
CSN: [id number]     Arrival date & time 04/05/14  0225 History  This chart was scribed for Loren Raceravid Bera Pinela, MD by Bronson CurbJacqueline Melvin, ED Scribe. This patient was seen in room A11C/A11C and the patient's care was started at 3:52 AM.     Chief Complaint  Patient presents with  . Back Pain    The history is provided by the patient. No language interpreter was used.     HPI Comments: Albin FischerRashaan Vicuna is a 43 y.o. male who presents to the Emergency Department complaining of an exacerbated episode of his chronic back pain that began tonight after he falling out of the bed. Patient states he injured his back several years ago after falling down a flight of stairs. Patient suspects swelling to his lower back. Patient reports the pain is worse when ambulating. He states he has been taking a muscle relaxer without significant improvement. He denies bowel/bladder incontinence or any urinary symptoms.  Patient has history of EtOH abuse, anemia, depression, and colitis. Patient denies focal weakness or numbness. Ventilating without difficulty.   Past Medical History  Diagnosis Date  . Alcohol abuse   . Anemia   . Severe major depression with psychotic features   . Colitis    History reviewed. No pertinent past surgical history. Family History  Problem Relation Age of Onset  . Hypertension Mother   . Hypertension Father   . Hypertension Other    History  Substance Use Topics  . Smoking status: Current Every Day Smoker -- 0.25 packs/day    Types: Cigarettes  . Smokeless tobacco: Not on file  . Alcohol Use: No     Comment: former  last use1 year ago    Review of Systems  Constitutional: Negative for fever and chills.  Respiratory: Negative for shortness of breath.   Cardiovascular: Negative for chest pain.  Gastrointestinal: Negative for nausea and vomiting.  Genitourinary: Negative for flank pain and difficulty urinating.  Musculoskeletal: Positive for myalgias and back pain. Negative for  neck pain and neck stiffness.  Skin: Negative for rash and wound.  Neurological: Negative for dizziness, syncope, weakness, light-headedness, numbness and headaches.  All other systems reviewed and are negative.     Allergies  Aspirin; Ibuprofen; Pork-derived products; and Sertraline  Home Medications   Prior to Admission medications   Medication Sig Start Date End Date Taking? Authorizing Provider  cyclobenzaprine (FLEXERIL) 10 MG tablet Take 1 tablet (10 mg total) by mouth 3 (three) times daily as needed for muscle spasms. 03/26/14   Mercedes Strupp Camprubi-Soms, PA-C  HYDROcodone-acetaminophen (NORCO) 5-325 MG per tablet Take 1-2 tablets by mouth every 6 (six) hours as needed for severe pain. 03/26/14   Mercedes Strupp Camprubi-Soms, PA-C  HYDROcodone-acetaminophen (NORCO/VICODIN) 5-325 MG per tablet Take 1 tablet by mouth every 6 (six) hours as needed. 03/03/14   Benny LennertJoseph L Zammit, MD  IRON PO Take 1 tablet by mouth daily.    Historical Provider, MD  naproxen (NAPROSYN) 500 MG tablet Take 1 tablet (500 mg total) by mouth 2 (two) times daily as needed for mild pain, moderate pain or headache (TAKE WITH MEALS.). 03/26/14   Mercedes Strupp Camprubi-Soms, PA-C  predniSONE (DELTASONE) 10 MG tablet Take 2 tablets (20 mg total) by mouth daily. 03/03/14   Benny LennertJoseph L Zammit, MD   Triage Vitals: BP 140/83 mmHg  Pulse 88  Temp(Src) 98 F (36.7 C)  Resp 20  SpO2 96%  Physical Exam  Constitutional: He is oriented to person, place, and time.  He appears well-developed and well-nourished. No distress.  HENT:  Head: Normocephalic and atraumatic.  Mouth/Throat: Oropharynx is clear and moist.  Eyes: EOM are normal. Pupils are equal, round, and reactive to light.  Neck: Normal range of motion. Neck supple.  No posterior midline cervical tenderness to palpation.  Cardiovascular: Normal rate and regular rhythm.   Pulmonary/Chest: Effort normal and breath sounds normal. No respiratory distress. He  has no wheezes. He has no rales. He exhibits no tenderness.  Abdominal: Soft. Bowel sounds are normal. He exhibits no distension and no mass. There is no tenderness. There is no rebound and no guarding.  Musculoskeletal: Normal range of motion. He exhibits tenderness (tenderness to palpation over the left superior lumbar paraspinal muscle. No obvious injury. No midline tenderness.). He exhibits no edema.  Negative straight leg raise. Distal pulses intact.  Neurological: He is alert and oriented to person, place, and time.  5/5 motor in all extremities. Sensation fully intact. Ambulating without difficulty.  Skin: Skin is warm and dry. No rash noted. No erythema.  Psychiatric: He has a normal mood and affect. His behavior is normal.  Nursing note and vitals reviewed.   ED Course  Procedures (including critical care time)  DIAGNOSTIC STUDIES: Oxygen Saturation is 96% on room air, adequate by my interpretation.    COORDINATION OF CARE: At [id number] Discussed treatment plan with patient. Patient agrees.   Labs Review Labs Reviewed - No data to display  Imaging Review No results found.   EKG Interpretation None      MDM   Final diagnoses:  None    I personally performed the services described in this documentation, which was scribed in my presence. The recorded information has been reviewed and is accurate.  Patient presents with exacerbation of his chronic back pain. We'll treat conservatively. Do not believe imaging is necessary at this point. Neurologic exam is normal. Return precautions given.   Loren Raceravid Tatiyanna Lashley, MD 04/05/14 [phone]

## 2014-04-05 NOTE — ED Notes (Signed)
The pt has had back pain for years.  Tonight he fell out of the bed and caused him more pain.  He is here with another pt

## 2014-04-05 NOTE — ED Notes (Signed)
Pt a/o x 4 on d/c. 

## 2014-04-05 NOTE — Discharge Instructions (Signed)
Chronic Back Pain ° When back pain lasts longer than 3 months, it is called chronic back pain. People with chronic back pain often go through certain periods that are more intense (flare-ups).  °CAUSES °Chronic back pain can be caused by wear and tear (degeneration) on different structures in your back. These structures include: °· The bones of your spine (vertebrae) and the joints surrounding your spinal cord and nerve roots (facets). °· The strong, fibrous tissues that connect your vertebrae (ligaments). °Degeneration of these structures may result in pressure on your nerves. This can lead to constant pain. °HOME CARE INSTRUCTIONS °· Avoid bending, heavy lifting, prolonged sitting, and activities which make the problem worse. °· Take brief periods of rest throughout the day to reduce your pain. Lying down or standing usually is better than sitting while you are resting. °· Take over-the-counter or prescription medicines only as directed by your caregiver. °SEEK IMMEDIATE MEDICAL CARE IF:  °· You have weakness or numbness in one of your legs or feet. °· You have trouble controlling your bladder or bowels. °· You have nausea, vomiting, abdominal pain, shortness of breath, or fainting. °Document Released: 05/28/2004 Document Revised: 07/13/2011 Document Reviewed: 04/04/2011 °ExitCare® Patient Information ©2015 [employer]. This information is not intended to replace advice given to you by your health care provider. Make sure you discuss any questions you have with your health care provider. ° ° °Emergency Department Resource Guide °1) Find a Doctor and Pay Out of Pocket °Although you won't have to find out who is covered by your insurance plan, it is a good idea to ask around and get recommendations. You will then need to call the office and see if the doctor you have chosen will accept you as a new patient and what types of options they offer for patients who are self-pay. Some doctors offer discounts or will set  up payment plans for their patients who do not have insurance, but you will need to ask so you aren't surprised when you get to your appointment. ° °2) Contact Your Local Health Department °Not all health departments have doctors that can see patients for sick visits, but many do, so it is worth a call to see if yours does. If you don't know where your local health department is, you can check in your phone book. The CDC also has a tool to help you locate your state's health department, and many state websites also have listings of all of their local health departments. ° °3) Find a Walk-in Clinic °If your illness is not likely to be very severe or complicated, you may want to try a walk in clinic. These are popping up all over the country in pharmacies, drugstores, and shopping centers. They're usually staffed by nurse practitioners or physician assistants that have been trained to treat common illnesses and complaints. They're usually fairly quick and inexpensive. However, if you have serious medical issues or chronic medical problems, these are probably not your best option. ° °No Primary Care Doctor: °- Call [facility] at  832-8000 - they can help you locate a primary care doctor that  accepts your insurance, provides certain services, etc. °- Physician Referral Service- 1-800-533-3463 ° °Chronic Pain Problems: °Organization         Address  Phone   Notes  °[facility]  (336) 297-2271 Patients need to be referred by their primary care doctor.  ° °Medication Assistance: °Organization         Address    Phone   Notes  °[place] [facility] [address] °[place], [place] [address] (336) 641-8030 --Must be a resident of [place] °-- Must have NO insurance coverage whatsoever (no Medicaid/ Medicare, etc.) °-- The pt. MUST have a primary care doctor that directs their care regularly and follows them in the community °  °[facility]  (866) 331-1348    °[employer]  (888) 892-1162   ° °Agencies that provide inexpensive medical care: °Organization         Address  Phone   Notes  °[facility]  (336) 832-8035   °[facility]    (336) 832-7272   °[facility] [address] °[place], [place] [address] (336) 832-4777   °[facility] [address], °[place] (336) 271-4999   °[facility]    (336) 373-0678   °[facility]    (336) 272-1050   °[facility] ° [address], [place] Phone:  (336) 832-4444, Fax:  (336) 832-4440 Hours of Operation:  9 am - 6 pm, M-F.  Also accepts Medicaid/Medicare and self-pay.  °[facility] ° [address], [place] Phone: (336) 832-3150, Fax: (336) 832-3151. Hours of Operation:  8:30 am - 5:30 pm, M-F.  Also accepts Medicaid and self-pay.  °[facility] [place] [address], [place] Phone: (336) 878-6027   °[facility] [address], [place], [place] (336)723-1848, Ext. 123 Mondays & Thursdays: 7-9 AM.  First 15 patients are seen on a first come, first serve basis. °  ° °Medicaid-accepting [place] Providers: ° °Organization         Address  Phone   Notes  °[facility] [address], [place] (336) 641-2100 Also accepts self-pay patients.  °[facility] [address], [place] ° (336) 856-9996   °[facility] [address], [place] (336) 288-8857   °[facility] [address] [place] [address], [place] (336) 299-7000   °Veita Bland [address], [place]  ° (336) 373-1557 Only accepts  Access Medicaid patients after they have their name applied to their card.  ° °Self-Pay (no insurance) in [place]: ° °Organization         Address  Phone   Notes  °Sickle Cell Patients, [facility] [address],  [place] (336) 832-1970   °[facility] [address], [place] (336) 832-4400   °[facility] ° [address], [place] (336) 992-4800   °[facility]/Dr. Osei-Bonsu ° [address] [place] [address], [place] or [address], [place] (336) 841-8500 Phone number for both [place] and [place] locations is the same.  °[facility] [address], [place] (336) 299-0000   °[facility] [address] [place] [address],  or [address] (336) 852-7530 °(336) 878-2260   °[facility] [address],  (336) 350-1642, phone; (336) 294-5005, fax Sees patients 1st and 3rd Saturday of every month.  Must not qualify for public or private insurance (i.e. Medicaid, Medicare,  Health Choice, Veterans' Benefits) • Household income should be no more than 200% of the poverty level •The clinic cannot treat you if you are pregnant or think you are pregnant •   Sexually transmitted diseases are not treated at the clinic.  ° ° °Dental Care: °Organization         Address  Phone  Notes  °[facility] [address], [place] (336) 641-6152 Accepts children up to age 21 who are enrolled in Medicaid or [place] Health Choice; pregnant women with a Medicaid card; and children who have applied for Medicaid or [place] Health Choice, but were declined, whose parents can pay a reduced fee at time of service.  °[facility] [place]  [address], [place] (336) 641-7733 Accepts children up to age 21 who are enrolled in Medicaid or [place] Health Choice; pregnant women with a Medicaid card; and children who have applied for Medicaid or [place] Health Choice, but were declined, whose parents can pay a reduced fee at time of service.  °[facility] ° [address], [place] (336)  641-4533 Patients are seen by appointment only. Walk-ins are not accepted. [facility] will see patients 18 years of age and older. °Monday - Tuesday (8am-5pm) °Most Wednesdays (8:30-5pm) °$30 per visit, cash only  °[facility] ° [address], [place] (336) 641-4533 Patients are seen by appointment only. Walk-ins are not accepted. [facility] will see patients 18 years of age and older. °One Wednesday Evening (Monthly: Volunteer Based).  $30 per visit, cash only  °[facility]  (919) 537-3737 for adults; Children under age 4, call Graduate Pediatric Dentistry at ([phone]) [phone]. Children aged 4-14, please call (919) [phone] to request a pediatric application. ° Dental services are provided in all areas of dental care including fillings, crowns and bridges, complete and partial dentures, implants, gum treatment, root canals, and extractions. Preventive care is also provided. Treatment is provided to both adults and children. °Patients are selected via a lottery and there is often a waiting list. °  °[facility] [address], °[place] ° (336) 763-8833 [email or web address] °  °[facility] [address], [place], [place] (336)723-1848, Ext. 123 Second and Fourth Thursday of each month, opens at 6:30 AM; Clinic ends at 9 AM.  Patients are seen on a first-come first-served basis, and a limited number are seen during each clinic.  ° °[facility] ° [address], [place], [place] (336) 723-7904   Eligibility Requirements °You must have lived in [place], [place], or [place] counties for at least the last three months. °  You cannot be eligible for state or federal sponsored healthcare insurance, including Veterans Administration, Medicaid, or Medicare. °  You generally cannot be eligible for healthcare insurance through your employer.  °  How to apply: °Eligibility screenings are held every Tuesday and Wednesday afternoon  from 1:00 pm until 4:00 pm. You do not need an appointment for the interview!  °[facility] [address], [place], [place] 336-631-2330   °[facility]  336-342-8273   °[facility]  336-703-3100   °[facility]  336-570-6415   ° °Behavioral Health Resources in the Community: °Intensive Outpatient Programs °Organization         Address  Phone  Notes  °[facility] [address], [place], [place] 336-878-6098   °[facility] [address], [place], [place] 336-832-9800   °[facility] [address], , [place] °   336-882-2125   °[facility] [address],  °[place], [place] 1-800-853-5163 or 336-641-4981   °Substance Abuse Resources °Organization         Address  Phone  Notes  °[facility]  336-882-2125   °[facility]  336-784-9470   °[facility]  336-285-9073   °[facility]  336-845-3988   °Residential & Outpatient Substance Abuse Program  1-800-659-3381   °Psychological Services °Organization         Address  Phone  Notes  °[facility]  336- 832-9600   °[facility]  336- 378-7881   °[facility] [address], [place] 1-800-853-5163 or 336-641-4981   ° °Mobile Crisis Teams °Organization         Address  Phone  Notes  °[facility], Mobile Crisis Care Unit  1-877-626-1772   °Assertive °[facility] ° [address] [place], [place] 336-834-9664   °Sharon DeEsch [address] °[place] 336-554-5454   ° °Self-Help/Support Groups °Organization         Address  Phone             Notes  °[facility] [place] - variety of support groups  336- 373-1402 Call for more information  °Narcotics Anonymous (NA), [facility] [address], °[place]  2 meetings at this location  ° °Residential Treatment  Programs °Organization         Address  Phone  Notes  °[facility] [address],    °[place]  1-866-801-8205   °[facility] ° [address], [place], [place] 704-293-8524   °[facility] [address], [place] 336-845-3988 Admissions: 8am-3pm M-F  °[facility] [address]    °[place], [place] 336-841-1104   °[facility] [address], [place], [place] 336-379-7146   °[facility] [address]  °[place], [place] 336-285-9073   °[facility] - Intensive Outpatient [address], [place], [place] 336-852-3033   °[facility] [address]  °[place], [place] 1-877-615-2722 or 336-784-9470   °[facility] [address] [place], [place] 336-227-7417 Accepts Medicaid  °[facility] [address]  ° [place] 1-800-659-3381 Substance Abuse/Addiction Treatment  ° °[place] Behavioral Health Resources °Organization         Address  Phone  Notes  °[facility]  (888) 581-9988   °Julie Brannon, PhD [address] [place], [place]   (336) 349-5553 or (336) 951-0000   °[facility]   [address] °[place], [place] (336) 349-4454   °[facility] [address], [place], [place] (336) 342-8316 Insurance/Medicaid/sponsorship through [facility]  °[facility] [address]                                    [place], [place] (336) 342-8316 Therapy/tele-psych/case  °[facility] [address]  ° [place], [place] (336) 349-2233    °Dr. Arfeen  (336) 349-4544   °[facility] [place]  [employer] [place] [facility] 1) [address],  °2) [address], [place] °3)  [address], [place] (336) 349-3220 °(336) 342-7768 ° °(336) 342-8140   °[place] [facility] (336) 342-1394 or (336) 342-3537 (  After Hours)    ° ° ° °

## 2014-04-29 IMAGING — CT CT ABD-PELV W/ CM
2 of 5 series · 17 of 46 positions shown, 19 images · IV contrast (water/omni  & 100ml omni 300)
Comparison: 06/14/2011

CLINICAL DATA: Right lower quadrant/right flank abdominal pain

CT ABDOMEN AND PELVIS WITH CONTRAST
TECHNIQUE: Multidetector CT imaging of the abdomen and pelvis was
performed following the standard protocol during bolus
administration of intravenous contrast.
Contrast: 100mL OMNIPAQUE IOHEXOL 300 MG/ML  SOLN

[Series 2: routine abdomen · axial · 0.79mm/px · z∈[-429,-49]mm · 14 of 86 slices shown, 16 images]
[im 5/86  soft-tissue]
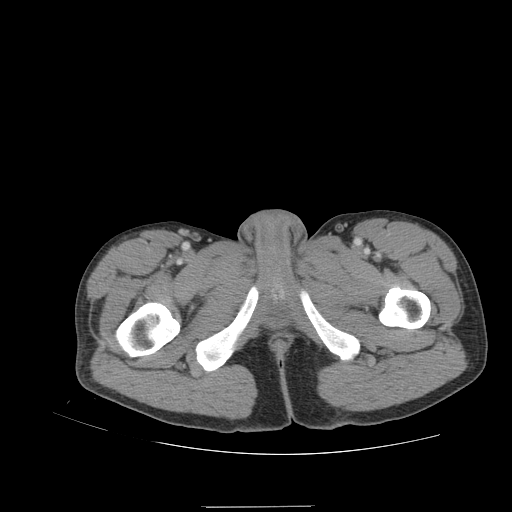
[im 5/86  bone]
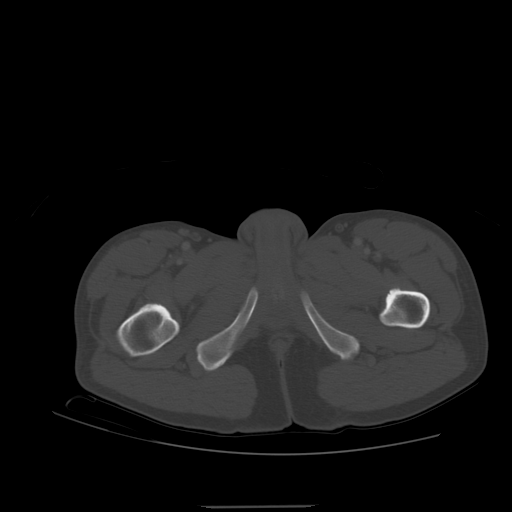
[im 10/86  soft-tissue]
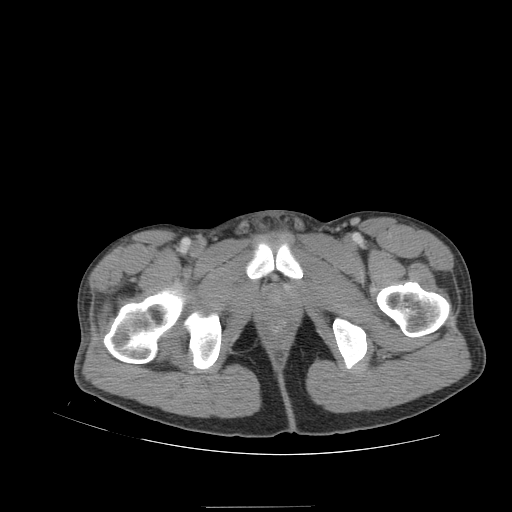
[im 19/86  soft-tissue]
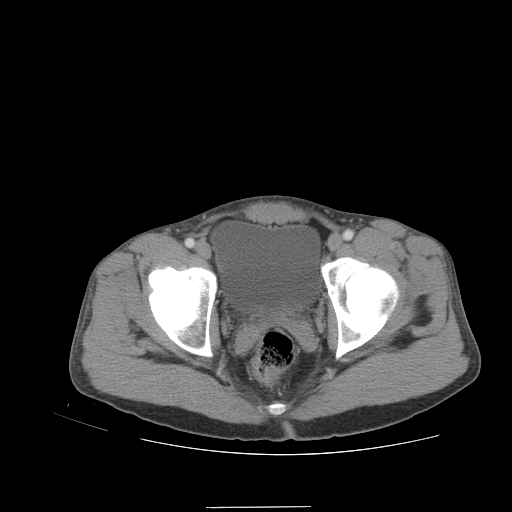
[im 24/86  soft-tissue]
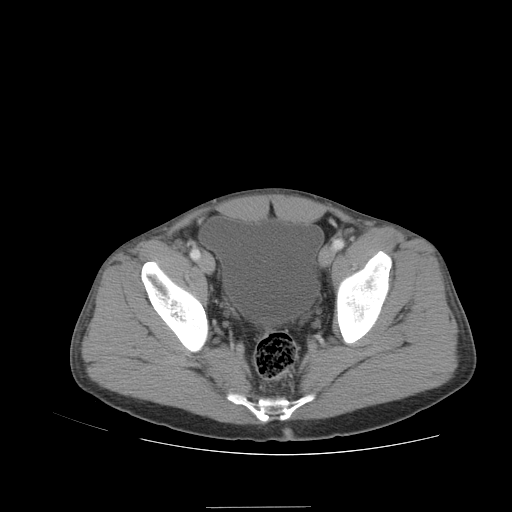
[im 29/86  soft-tissue]
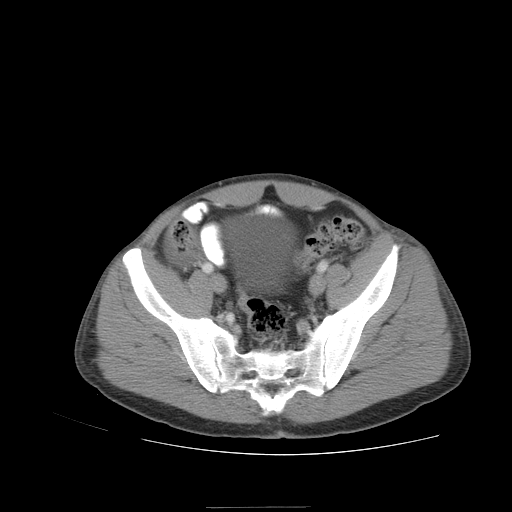
[im 34/86  soft-tissue]
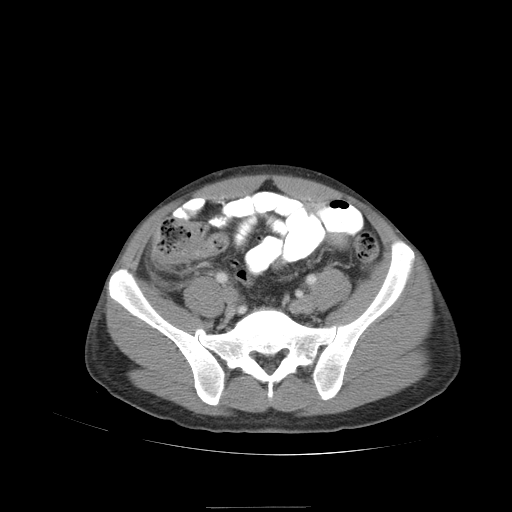
[im 38/86  soft-tissue]
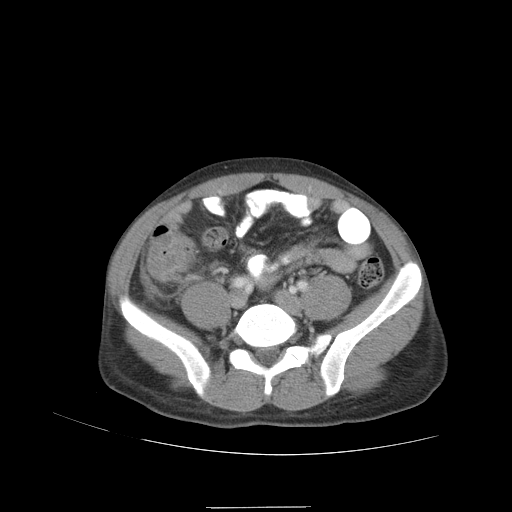
[im 48/86  soft-tissue]
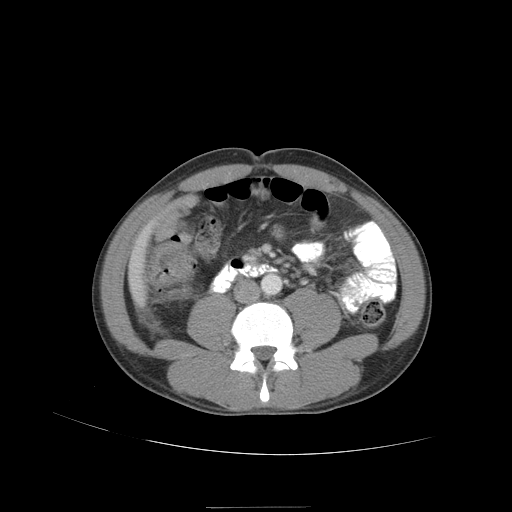
[im 52/86  soft-tissue]
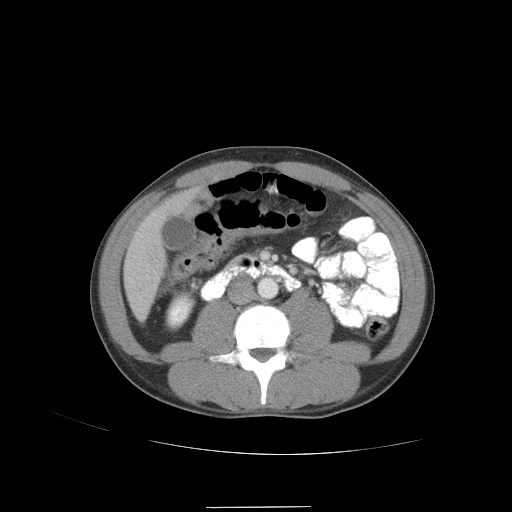
[im 52/86  bone]
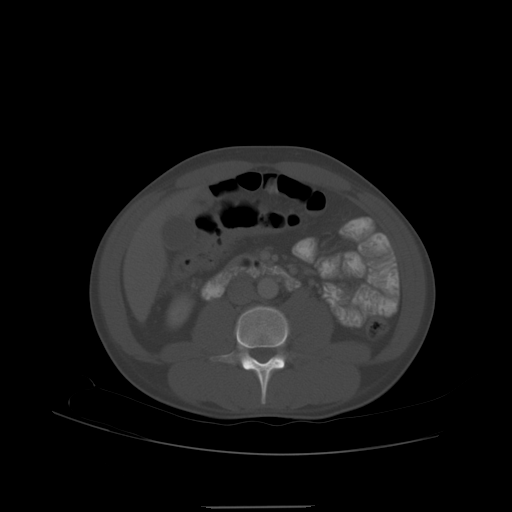
[im 57/86  soft-tissue]
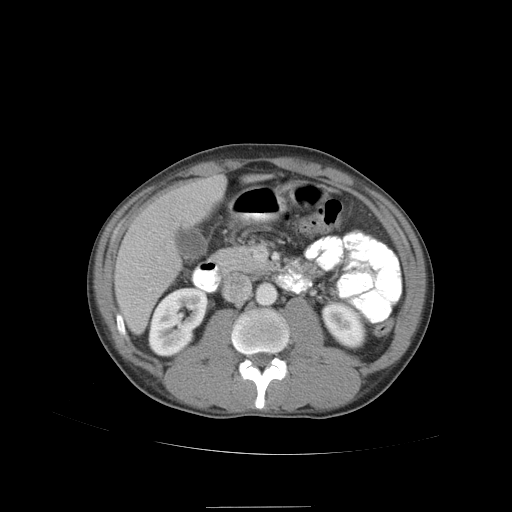
[im 62/86  soft-tissue]
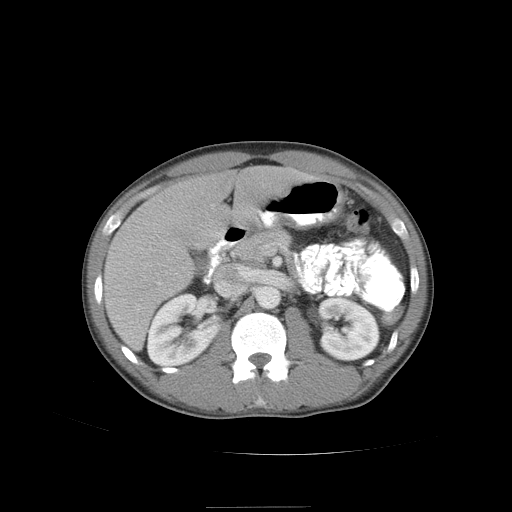
[im 67/86  soft-tissue]
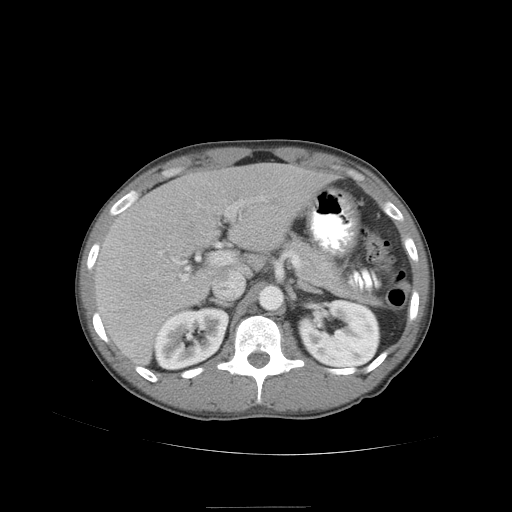
[im 76/86  soft-tissue]
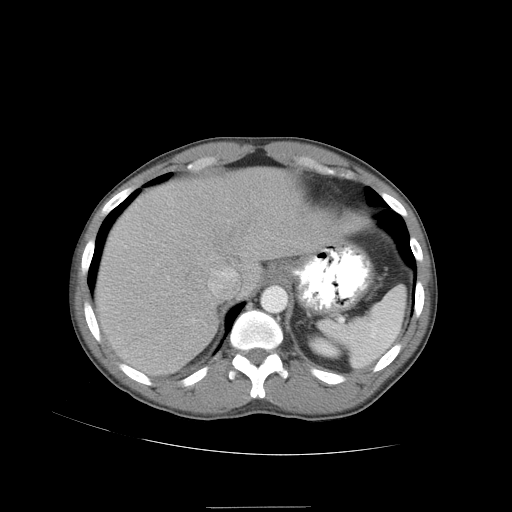
[im 81/86  soft-tissue]
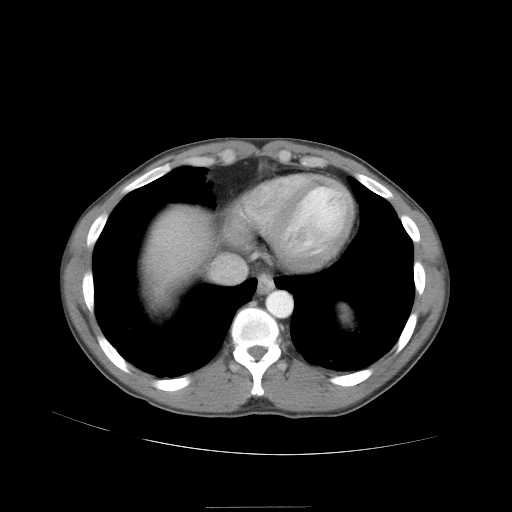

[Series 401: coronal · coronal · 0.85mm/px · 3 of 82 slices shown]
[im 28/82  soft-tissue]
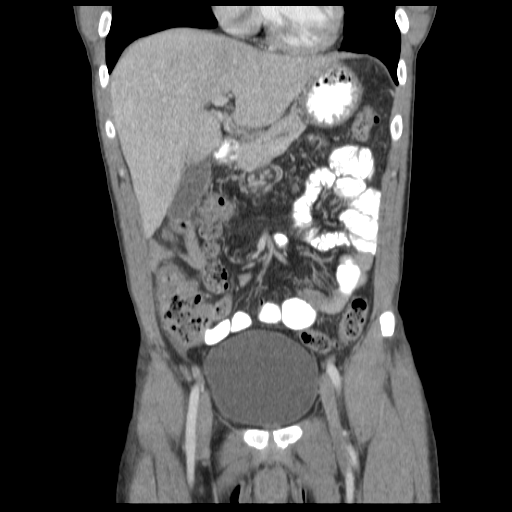
[im 37/82  soft-tissue]
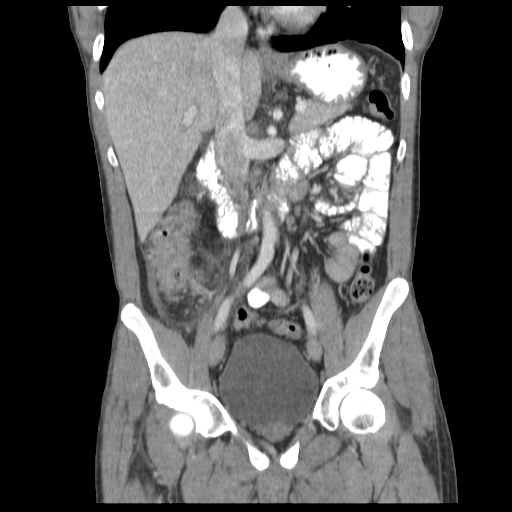
[im 46/82  soft-tissue]
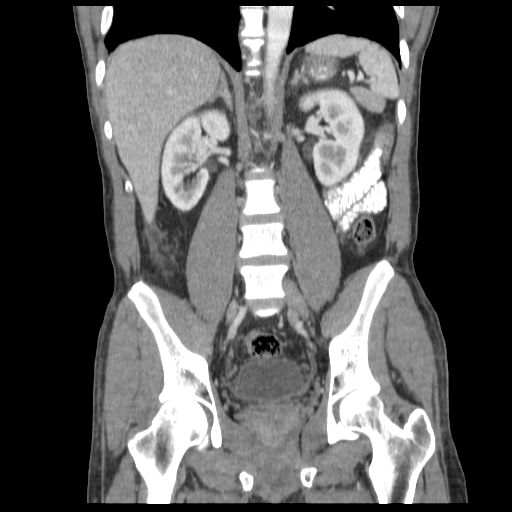

[17 of 46 positions shown; findings below may reference images not displayed]

FINDINGS: Mild dependent atelectasis at the lung bases.

Liver, spleen, pancreas, and adrenal glands are within normal
limits.

Gallbladder is unremarkable.  No intrahepatic or extrahepatic
ductal dilatation.

Kidneys are within normal limits.  No hydronephrosis.

No evidence of bowel obstruction.  Normal appendix.  Wall
thickening/inflammatory changes involving the ascending colon
(series 2/image 43).  A few scattered colonic diverticula are
present in this region.

No drainable fluid collection or abscess.  No free air.

No evidence of abdominal aortic aneurysm.

No abdominopelvic ascites.

8 mm short axis ileocolic node (series 2/image 47).

Prostate is unremarkable.

Bladder is within normal limits.

Visualized osseous structures are within normal limits.
IMPRESSION: Wall thickening/inflammatory changes involving the ascending colon,
possibly reflecting diverticulitis or colitis.

Given the appearance, follow-up colonoscopy is suggested to exclude
underlying colonic neoplasm.

No drainable fluid collection/abscess or free air.  Associated 8 mm
short-axis ileocolic node.

## 2014-05-08 ENCOUNTER — Encounter (HOSPITAL_COMMUNITY): Payer: Self-pay | Admitting: Emergency Medicine

## 2014-05-08 ENCOUNTER — Emergency Department (HOSPITAL_COMMUNITY)
Admission: EM | Admit: 2014-05-08 | Discharge: 2014-05-08 | Disposition: A | Discharge status: Home / Self Care | Payer: Self-pay | Attending: Emergency Medicine | Admitting: Emergency Medicine

## 2014-05-08 DIAGNOSIS — Z79899 Other long term (current) drug therapy: Secondary | ICD-10-CM | POA: Insufficient documentation

## 2014-05-08 DIAGNOSIS — D649 Anemia, unspecified: Secondary | ICD-10-CM | POA: Insufficient documentation

## 2014-05-08 DIAGNOSIS — Z8659 Personal history of other mental and behavioral disorders: Secondary | ICD-10-CM | POA: Insufficient documentation

## 2014-05-08 DIAGNOSIS — Z720 Tobacco use: Secondary | ICD-10-CM | POA: Insufficient documentation

## 2014-05-08 DIAGNOSIS — N492 Inflammatory disorders of scrotum: Secondary | ICD-10-CM | POA: Insufficient documentation

## 2014-05-08 DIAGNOSIS — Z7952 Long term (current) use of systemic steroids: Secondary | ICD-10-CM | POA: Insufficient documentation

## 2014-05-08 DIAGNOSIS — Z8719 Personal history of other diseases of the digestive system: Secondary | ICD-10-CM | POA: Insufficient documentation

## 2014-05-08 MED ORDER — CEPHALEXIN 500 MG PO CAPS: 500.0000 mg | ORAL_CAPSULE | Freq: Four times a day (QID) | ORAL | Status: DC

## 2014-05-08 MED ORDER — HYDROCODONE-ACETAMINOPHEN 5-325 MG PO TABS: 2.0000 | ORAL_TABLET | ORAL | Status: DC | PRN

## 2014-05-08 NOTE — ED Notes (Signed)
Started yesterday with boil to groin.

## 2014-05-08 NOTE — ED Provider Notes (Signed)
CSN: [id number]     Arrival date & time 05/08/14  1052 History  This chart was scribed for non-physician practitioner working with Raeford Razor, MD, by Ronney Lion, ED Scribe. This patient was seen in room TR11C/TR11C and the patient's care was started at 11:52 AM.    Chief Complaint  Patient presents with  . Abscess   The history is provided by the patient. No language interpreter was used.     HPI Comments: [name] is a 44 y.o. male who presents to the Emergency Department complaining of a sudden onset painful abscess to his groin that he noticed yesterday after returning to [place] on a long ride from [place] for holiday. He complains of associated pain around the abscess. He denies fever.   Past Medical History  Diagnosis Date  . Alcohol abuse   . Anemia   . Severe major depression with psychotic features   . Colitis    History reviewed. No pertinent past surgical history. Family History  Problem Relation Age of Onset  . Hypertension Mother   . Hypertension Father   . Hypertension Other    History  Substance Use Topics  . Smoking status: Current Every Day Smoker -- 0.25 packs/day    Types: Cigarettes  . Smokeless tobacco: Not on file  . Alcohol Use: No     Comment: former  last use1 year ago    Review of Systems  Skin:       Abscess  All other systems reviewed and are negative.     Allergies  Aspirin; Ibuprofen; Pork-derived products; and Sertraline  Home Medications   Prior to Admission medications   Medication Sig Start Date End Date Taking? Authorizing Provider  cyclobenzaprine (FLEXERIL) 10 MG tablet Take 1 tablet (10 mg total) by mouth 3 (three) times daily as needed for muscle spasms. 03/26/14   Mercedes Strupp Camprubi-Soms, PA-C  HYDROcodone-acetaminophen (NORCO) 5-325 MG per tablet Take 1-2 tablets by mouth every 6 (six) hours as needed for severe pain. 03/26/14   Mercedes Strupp Camprubi-Soms, PA-C  HYDROcodone-acetaminophen  (NORCO/VICODIN) 5-325 MG per tablet Take 1 tablet by mouth every 6 (six) hours as needed. 03/03/14   Benny Lennert, MD  IRON PO Take 1 tablet by mouth daily.    Historical Provider, MD  methocarbamol (ROBAXIN) 500 MG tablet Take 2 tablets (1,000 mg total) by mouth 2 (two) times daily. 04/05/14   Loren Racer, MD  naproxen (NAPROSYN) 500 MG tablet Take 1 tablet (500 mg total) by mouth 2 (two) times daily as needed for mild pain, moderate pain or headache (TAKE WITH MEALS.). 03/26/14   Mercedes Strupp Camprubi-Soms, PA-C  predniSONE (DELTASONE) 10 MG tablet Take 2 tablets (20 mg total) by mouth daily. 03/03/14   Benny Lennert, MD   BP 134/73 mmHg  Pulse 80  Temp(Src) 97.9 F (36.6 C) (Oral)  Resp 16  Ht  (1.702 m)  Wt 145 lb (65.772 kg)  BMI 22.71 kg/m2  SpO2 95% Physical Exam  Constitutional: He is oriented to person, place, and time. He appears well-developed and well-nourished. No distress.  HENT:  Head: Normocephalic and atraumatic.  Eyes: Conjunctivae and EOM are normal.  Neck: Neck supple. No tracheal deviation present.  Cardiovascular: Normal rate.   Pulmonary/Chest: Effort normal. No respiratory distress.  Abdominal: Soft. He exhibits no distension. There is no tenderness. There is no rebound.  Musculoskeletal: Normal range of motion.  Neurological: He is alert and oriented to person, place, and time.  Skin: Skin is warm and dry.  1 cm pustule of left scrotum that is tender to palpation. No generalized scrotal swelling or testicular tenderness.  Psychiatric: He has a normal mood and affect. His behavior is normal.  Nursing note and vitals reviewed.   ED Course  Procedures (including critical care time)  DIAGNOSTIC STUDIES: Oxygen Saturation is 95% on room air, normal by my interpretation.    COORDINATION OF CARE: 11:55 AM - Discussed treatment plan with pt at bedside which includes antibiotics, and drainage at home in a warm shower, and pt agreed to plan.  Instructions to return if patient notices generalized scrotal swelling.   MDM   Final diagnoses:  Abscess, scrotum   Patient has a small pus collection on the scrotum without surrounding infection. Patient instructed to apply warm compress and express the pus at home. Patient instructed to return with worsening or concerning symptoms.   I personally performed the services described in this documentation, which was scribed in my presence. The recorded information has been reviewed and is accurate.    [address] Hill StreetKaitlyn ByronSzekalski, PA-C 05/11/14 [id number]  Raeford RazorStephen Kohut, MD 05/11/14 [id number]

## 2014-05-08 NOTE — Discharge Instructions (Signed)
Take keflex as directed until gone. Take Vicodin as needed for pain. Sit in a warm bath and try to express some of the pus from the boil. Return to the ED with worsening or concerning symptoms.

## 2014-05-10 ENCOUNTER — Emergency Department (HOSPITAL_COMMUNITY)
Admission: EM | Admit: 2014-05-10 | Discharge: 2014-05-10 | Disposition: A | Discharge status: Home / Self Care | Payer: Self-pay | Attending: Emergency Medicine | Admitting: Emergency Medicine

## 2014-05-10 ENCOUNTER — Encounter (HOSPITAL_COMMUNITY): Payer: Self-pay | Admitting: Cardiology

## 2014-05-10 DIAGNOSIS — Z23 Encounter for immunization: Secondary | ICD-10-CM | POA: Insufficient documentation

## 2014-05-10 DIAGNOSIS — Z79899 Other long term (current) drug therapy: Secondary | ICD-10-CM | POA: Insufficient documentation

## 2014-05-10 DIAGNOSIS — Z8719 Personal history of other diseases of the digestive system: Secondary | ICD-10-CM | POA: Insufficient documentation

## 2014-05-10 DIAGNOSIS — Z8659 Personal history of other mental and behavioral disorders: Secondary | ICD-10-CM | POA: Insufficient documentation

## 2014-05-10 DIAGNOSIS — L02215 Cutaneous abscess of perineum: Secondary | ICD-10-CM | POA: Insufficient documentation

## 2014-05-10 DIAGNOSIS — Z720 Tobacco use: Secondary | ICD-10-CM | POA: Insufficient documentation

## 2014-05-10 DIAGNOSIS — D649 Anemia, unspecified: Secondary | ICD-10-CM | POA: Insufficient documentation

## 2014-05-10 MED ORDER — HYDROMORPHONE HCL 1 MG/ML IJ SOLN
2.0000 mg | Freq: Once | INTRAMUSCULAR | Status: AC
  Administered 2014-05-10: 2 mg via INTRAMUSCULAR
  Filled 2014-05-10: qty 2

## 2014-05-10 MED ORDER — SULFAMETHOXAZOLE-TRIMETHOPRIM 800-160 MG PO TABS: 1.0000 | ORAL_TABLET | Freq: Two times a day (BID) | ORAL | Status: AC

## 2014-05-10 MED ORDER — LIDOCAINE HCL (PF) 1 % IJ SOLN
30.0000 mL | Freq: Once | INTRAMUSCULAR | Status: AC
  Administered 2014-05-10: 30 mL
  Filled 2014-05-10: qty 30

## 2014-05-10 MED ORDER — TETANUS-DIPHTH-ACELL PERTUSSIS 5-2.5-18.5 LF-MCG/0.5 IM SUSP
0.5000 mL | Freq: Once | INTRAMUSCULAR | Status: AC
  Administered 2014-05-10: 0.5 mL via INTRAMUSCULAR
  Filled 2014-05-10: qty 0.5

## 2014-05-10 MED ORDER — OXYCODONE-ACETAMINOPHEN 5-325 MG PO TABS: 1.0000 | ORAL_TABLET | ORAL | Status: DC | PRN

## 2014-05-10 NOTE — ED Notes (Signed)
Pt reports an abscess under his left testicle. Reports that he was here a couple of days ago and did not have the area drained, but now wants to.

## 2014-05-10 NOTE — Discharge Instructions (Signed)
Read the information below.  Use the prescribed medication as directed.  Please discuss all new medications with your pharmacist.  Do not take additional tylenol while taking the prescribed pain medication to avoid overdose.  You may return to the Emergency Department at any time for worsening condition or any new symptoms that concern you.   If you develop redness, swelling, uncontrolled pain, or fevers greater than 100.4, return to the ER immediately for a recheck.      Abscess An abscess (boil or furuncle) is an infected area on or under the skin. This area is filled with yellowish-white fluid (pus) and other material (debris). HOME CARE   Only take medicines as told by your doctor.  If you were given antibiotic medicine, take it as directed. Finish the medicine even if you start to feel better.  If gauze is used, follow your doctor's directions for changing the gauze.  To avoid spreading the infection:  Keep your abscess covered with a bandage.  Wash your hands well.  Do not share personal care items, towels, or whirlpools with others.  Avoid skin contact with others.  Keep your skin and clothes clean around the abscess.  Keep all doctor visits as told. GET HELP RIGHT AWAY IF:   You have more pain, puffiness (swelling), or redness in the wound site.  You have more fluid or blood coming from the wound site.  You have muscle aches, chills, or you feel sick.  You have a fever. MAKE SURE YOU:   Understand these instructions.  Will watch your condition.  Will get help right away if you are not doing well or get worse. Document Released: 10/07/2007 Document Revised: 10/20/2011 Document Reviewed: 07/03/2011 [facility] Patient Information 2015 [place], [name]. This information is not intended to replace advice given to you by your health care provider. Make sure you discuss any questions you have with your health care provider.   Emergency Department Resource Guide 1) Find a  Doctor and Pay Out of Pocket Although you won't have to find out who is covered by your insurance plan, it is a good idea to ask around and get recommendations. You will then need to call the office and see if the doctor you have chosen will accept you as a new patient and what types of options they offer for patients who are self-pay. Some doctors offer discounts or will set up payment plans for their patients who do not have insurance, but you will need to ask so you aren't surprised when you get to your appointment.  2) Contact Your Local Health Department Not all health departments have doctors that can see patients for sick visits, but many do, so it is worth a call to see if yours does. If you don't know where your local health department is, you can check in your phone book. The CDC also has a tool to help you locate your state's health department, and many state websites also have listings of all of their local health departments.  3) Find a Walk-in Clinic If your illness is not likely to be very severe or complicated, you may want to try a walk in clinic. These are popping up all over the country in pharmacies, drugstores, and shopping centers. They're usually staffed by nurse practitioners or physician assistants that have been trained to treat common illnesses and complaints. They're usually fairly quick and inexpensive. However, if you have serious medical issues or chronic medical problems, these are probably not your best  option.  No Primary Care Doctor: - Call Health Connect at  [phone] - they can help you locate a primary care doctor that  accepts your insurance, provides certain services, etc. - Physician Referral Service- [phone]  Chronic Pain Problems: Organization         Address  Phone   Notes  [facility]  [phone] Patients need to be referred by their primary care doctor.   Medication Assistance: Organization         Address  Phone    Notes  [facility] Medication [facility] [address] [place] [place], [place] [address] [phone] --Must be a resident of [facility] -- Must have NO insurance coverage whatsoever (no Medicaid/ Medicare, etc.) -- The pt. MUST have a primary care doctor that directs their care regularly and follows them in the community   [facility]  [phone]   [employer]  [phone]    Agencies that provide inexpensive medical care: Organization         Address  Phone   Notes  [facility]  [phone]   [facility]    [phone]   [facility] [address] [place], [place] [address] [phone]   [facility] [address], [place] [phone]   Planned Parenthood    ([phone]   [facility]    [phone]   [facility] and [facility]  [address], [place] Phone:  [phone], Fax:  [phone] Hours of Operation:  9 am - 6 pm, M-F.  Also accepts Medicaid/Medicare and self-pay.  [facility]  [address], [place] Phone: [phone], Fax: [phone]. Hours of Operation:  8:30 am - 5:30 pm, M-F.  Also accepts Medicaid and self-pay.  [facility] [place] [address], [place] Phone: [phone]   [facility] [address] Natasha Bence [place], [place] [phone], Ext. [phone] Mondays & Thursdays: 7-9 AM.  First 15 patients are seen on a first come, first serve basis.    Medicaid-accepting [facility] Providers:  Organization         Address  Phone   Notes  [facility] [address], [place] [phone] Also accepts self-pay patients.  [facility] [address] Laurell Josephs [place], [place]  [phone]   [facility] [address], [place] [phone]   [facility] [address], [place] [phone]   Renaye Rakers [address], [place]   [phone] Only accepts [place] Access [place] patients after they have their name applied to their card.   Self-Pay (no insurance) in [facility]:  Organization         Address  Phone   Notes  Sickle Cell Patients, [facility] [address] [place], [place] [phone]   [facility] [address] [place], [place] [phone]   [facility] [place]  [address], [place] [phone]   [facility]/Dr. Osei-Bonsu  [address], [place] or [address], [place] [phone] Phone number for both [place] and [place] locations is the same.  Urgent Medical and [facility] [address], [place] Otto (  [phone]   [facility] [place] [address] [place] [address], [place] or [address] [phone]   [facility] [address], [place] [phone]) [phone], phone; [phone](336) [phone], fax Sees patients 1st and 3rd Saturday of every month.  Must not qualify for public or private insurance (i.e. Medicaid, Medicare, [place] Health Choice, Veterans' Benefits)  Household income should be no more than 200% of the poverty level The clinic cannot treat you if you are pregnant or think you are pregnant  Sexually transmitted diseases are not treated at the clinic.    Dental Care: Organization         Address  Phone  Notes  [facility] [address], [place] [phone]) [phone] Accepts children up to age 45 who are enrolled in [place] or [place] Health Choice; pregnant women with a Medicaid card; and children who have applied for Medicaid or [place] Health Choice, but were declined, whose parents can pay a reduced fee at time of service.  [facility] [place]  [address], [place] [phone]) [phone] Accepts children up to age 45 who are enrolled in [place] or [place] Health Choice; pregnant women with a Medicaid card; and children who have applied for Medicaid or [place] Health Choice, but were declined, whose parents can pay a reduced fee at time of service.  [facility]  [address], [place] [phone] Patients are seen by appointment only. Walk-ins are not accepted. [facility] will see patients 518 years of age and older. Monday - Tuesday (8am-5pm) Most Wednesdays (8:30-5pm) $30 per visit, cash only  [facility]  [address], [facility] [place] [phone] Patients are seen by appointment only. Walk-ins are not accepted. [facility] will see patients 45 years of age and older. One Wednesday Evening (Monthly: Volunteer Based).  $30 per visit, cash only  [employer] [facility] [facility]  [phone]) [phone] for adults; Children under age 44, call Graduate Pediatric Dentistry at [phone]. Children aged 384-14, please call [phone]) [phone] to request a pediatric application.  Dental services are provided in all areas of dental care including fillings, crowns and bridges, complete and partial dentures, implants, gum treatment, root canals, and extractions. Preventive care is also provided. Treatment is provided to both adults and children. Patients are selected via a lottery and there is often a waiting list.   [facility] [address], [place]  [phone] [email or web address]   [facility] [address], [place], [place] [phone], Ext. [phone] Second and Fourth Thursday of each month, opens at 6:30 AM; Clinic ends at 9 AM.  Patients are seen on a first-come first-served basis, and a limited number are seen during each clinic.   [facility]  [address], [place], [place] [phone]   Eligibility Requirements You must have lived in  [place], [place], or [place] counties for at least the last three months.   You cannot be eligible for state or federal sponsored [place]healthcare insurance, including CIGNAVeterans Administration, [place]icaid, or [employer]icare.   You generally cannot be eligible for healthcare insurance through your employer.    How to apply: Eligibility screenings are held every Tuesday and Wednesday afternoon from 1:00 pm until 4:00 pm. You do not need an appointment for the interview!  [facility] [address], [place],  [place] [phone]   [facility]  [phone]   [name]  [phone]   [facility]  [phone]    Behavioral Health Resources in the Community: Intensive Outpatient Programs Organization         Address  Phone  Notes  on [facility] [address], [place], [place] [phone]   [facility] Outpatient [address], [place], [place] [phone]   [facility]: [facility] [address], [place], [place]  [phone]   [facility] Mental Health [address],  [place], [place] [phone] or [phone]   Substance Abuse Resources Organization         Address  Phone  Notes  Al[facility] Drug Services  [phone]   [facility]  [phone]   [facility] [place]  [phone]   Floydene Flock  [phone]   Residential & Outpatient Substance Abuse Program  [phone]   Psychological Services Organization         Address  Phone  Notes  [facility]  [phone]   [facility]  [phone]   [facility] Mental Health [address] [place] [phone] or [phone]    Mobile Crisis Teams Organization         Address  Phone  Notes  [facility], Mobile Crisis Care Unit  [phone]   [facility]  [address] [place], [place] [phone]   Doristine Locks [address] [place] [phone]    Self-Help/Support Groups Organization         Address  Phone             Notes  [facility] [place] - variety of support groups  [phone]- I7437963 Call for more information  Narcotics Anonymous (NA), [facility] [address], [employer] [place]  2 meetings at this location   Statistician         Address  Phone  Notes  [facility] [address],    [place]  [phone]   [facility]  [address] [place] [address], [place], [place] [phone]   [facility] [address] [place], [place] [phone] Admissions: 8am-3pm M-F  [facility] [address],    [place], [place] [phone]   [facility] [address] [place], [place], [place] [phone]   [facility] [address]   [place], [place] [phone]   [facility] - Intensive Outpatient [address]urell Joseph[address], [place], [place] [phone]   [facility] ([facility] [address]  [place], [place] [phone] or [phone]   [facility] ([facility]) [address] [place], [place] [phone] Accepts Medicaid  [facility] [address],  [place] [phone] Substance Abuse/Addiction Treatment   [facility] Organization         Address  Phone  Notes  [facility]  [phone]   Angie Fava, PhD [address] [place] [place], [place]   [phone] or [phone]   [facility] Behavioral   [address] [place], [place] [phone]   [facility] [address], [place], [place] [phone] Insurance/Medicaid/sponsorship through [employer] and Families [address]  [place], [place] [phone] [place], [place] [phone]    Dr. Adele Schilder  [phone]   [facility] Dept. 1) [address], [place] 2) [place] 3)  [address], [place] [phone]  [phone]   [place] [phone] or [phone] (After Hours)

## 2014-05-10 NOTE — ED Provider Notes (Signed)
CSN: [id number]     Arrival date & time 05/10/14  1721 History   None    This chart was scribed for non-physician practitioner, Trixie Dredge PA-C working with Raeford Razor, MD by Arlan Organ, ED Scribe. This patient was seen in room TR07C/TR07C and the patient's care was started at 6:49 PM.   Chief Complaint  Patient presents with  . Abscess   HPI  HPI Comments: [name] is a 44 y.o. male who presents to the Emergency Department complaining of an ongoing, worsening abscess of groin x 4 days. Area is painful and exacerbated with certain movements. Pt was seen 05/08/14 for same and advised to try warm compresses at home as he declined I&D treatment at that time. Pt was also started on Keflex 4 times daily. He has only had 1 dose today and 4 doses yesterday. [name] also admits to nausea and chills. [name] has attempted warm compresses but says symptoms have worsened. No recent fever, vomiting, dysuria, pain or swelling to testicles. Pt with known allergy to Sertraline and Aspirin.  Past Medical History  Diagnosis Date  . Alcohol abuse   . Anemia   . Severe major depression with psychotic features   . Colitis    History reviewed. No pertinent past surgical history. Family History  Problem Relation Age of Onset  . Hypertension Mother   . Hypertension Father   . Hypertension Other    History  Substance Use Topics  . Smoking status: Current Every Day Smoker -- 0.25 packs/day    Types: Cigarettes  . Smokeless tobacco: Not on file  . Alcohol Use: No     Comment: former  last use1 year ago    Review of Systems  Constitutional: Negative for fever and chills.  Genitourinary: Negative for dysuria, penile swelling, scrotal swelling and testicular pain.  Skin: Positive for wound.  All other systems reviewed and are negative.     Allergies  Aspirin; Ibuprofen; Pork-derived products; and Sertraline  Home Medications   Prior to Admission medications   Medication Sig  Start Date End Date Taking? Authorizing Provider  cephALEXin (KEFLEX) 500 MG capsule Take 1 capsule (500 mg total) by mouth 4 (four) times daily. 05/08/14   Kaitlyn Szekalski, PA-C  cyclobenzaprine (FLEXERIL) 10 MG tablet Take 1 tablet (10 mg total) by mouth 3 (three) times daily as needed for muscle spasms. 03/26/14   Mercedes Strupp Camprubi-Soms, PA-C  HYDROcodone-acetaminophen (NORCO/VICODIN) 5-325 MG per tablet Take 2 tablets by mouth every 4 (four) hours as needed for moderate pain or severe pain. 05/08/14   Kaitlyn Szekalski, PA-C  IRON PO Take 1 tablet by mouth daily.    Historical Provider, MD  methocarbamol (ROBAXIN) 500 MG tablet Take 2 tablets (1,000 mg total) by mouth 2 (two) times daily. 04/05/14   Loren Racer, MD  naproxen (NAPROSYN) 500 MG tablet Take 1 tablet (500 mg total) by mouth 2 (two) times daily as needed for mild pain, moderate pain or headache (TAKE WITH MEALS.). 03/26/14   Mercedes Strupp Camprubi-Soms, PA-C  predniSONE (DELTASONE) 10 MG tablet Take 2 tablets (20 mg total) by mouth daily. 03/03/14   Benny Lennert, MD   Triage Vitals: BP 137/89 mmHg  Pulse 88  Temp(Src) 98.1 F (36.7 C) (Oral)  Resp 18  Ht  (1.676 m)  Wt 145 lb (65.772 kg)  BMI 23.41 kg/m2  SpO2 100%   Physical Exam  Constitutional: He appears well-developed and well-nourished. No distress.  HENT:  Head:  Normocephalic and atraumatic.  Neck: Neck supple.  Pulmonary/Chest: Effort normal.  Genitourinary: Testes normal. Right testis shows no mass, no swelling and no tenderness. Right testis is descended. Left testis shows no mass, no swelling and no tenderness. Left testis is descended.     Neurological: He is alert.  Skin: He is not diaphoretic.  Nursing note and vitals reviewed.   ED Course  Procedures (including critical care time)  DIAGNOSTIC STUDIES: Oxygen Saturation is 100% on RA, Normal by my interpretation.    COORDINATION OF CARE: 8:14 PM- Will perform I&D. Discussed  treatment plan with pt at bedside and pt agreed to plan.     INCISION AND DRAINAGE PROCEDURE NOTE: Patient identification was confirmed and verbal consent was obtained. This procedure was performed by Trixie DredgeEmily Candia Kingsbury PA-C at 8:14 PM. Site: under L testicle Sterile procedures observed Anesthetic used (type and amt): 3 CC of 1% Lidocaine without epinephrine Drainage: Tiny amount of purulent drainage Complexity: Complex Packing used: None Site anesthetized, incision made over site, wound drained and explored loculations, rinsed with copious amounts of normal saline, wound packed with sterile gauze, covered with dry, sterile dressing.  Pt tolerated procedure well without complications.  Instructions for care discussed verbally and pt provided with additional written instructions for homecare and f/u.   Labs Review Labs Reviewed - No data to display  Imaging Review No results found.   EKG Interpretation None       EMERGENCY DEPARTMENT US SOFT TISSUE INTERPRETATION "Study: Limited Ultrasound of the noted body part in comments below"  INDICATIONS: Soft tissue infection Multiple views of the body part are obtained with a multi-frequency linear probe  PERFORMED BY:  Myself  IMAGES ARCHIVED?: Yes  SIDE:Left  BODY PART:Other soft tisse (comment in note)  FINDINGS: Other Superficial abscess  LIMITATIONS:  Emergent Procedure  INTERPRETATION:  Abcess present  COMMENT:  Very superficial fluid collection.  No deep abscess or fluid collection identified   MDM   Final diagnoses:  Perineal abscess, superficial    Afebrile, nontoxic patient with perineal abscess.  Attempted needle aspiration and very superficial incision without significant drainage.  Added doxycyline and additional pain medication.  Discussed home care and return precautions.  D/C home with percocet, doxycyline.  Discussed result, findings, treatment, and follow up  with patient.  Pt given return precautions.  Pt  verbalizes understanding and agrees with plan.      I personally performed the services described in this documentation, which was scribed in my presence. The recorded information has been reviewed and is accurate.    Trixie Dredgemily Jonnathan Birman, PA-C 05/10/14 2154  Raeford RazorStephen Kohut, MD 05/12/14 [phone]

## 2014-06-06 ENCOUNTER — Emergency Department (HOSPITAL_COMMUNITY)
Admission: EM | Admit: 2014-06-06 | Discharge: 2014-06-06 | Disposition: A | Discharge status: Home / Self Care | Payer: Self-pay | Attending: Emergency Medicine | Admitting: Emergency Medicine

## 2014-06-06 ENCOUNTER — Encounter (HOSPITAL_COMMUNITY): Payer: Self-pay

## 2014-06-06 DIAGNOSIS — Z8719 Personal history of other diseases of the digestive system: Secondary | ICD-10-CM | POA: Insufficient documentation

## 2014-06-06 DIAGNOSIS — D649 Anemia, unspecified: Secondary | ICD-10-CM | POA: Insufficient documentation

## 2014-06-06 DIAGNOSIS — Z8659 Personal history of other mental and behavioral disorders: Secondary | ICD-10-CM | POA: Insufficient documentation

## 2014-06-06 DIAGNOSIS — L03315 Cellulitis of perineum: Secondary | ICD-10-CM | POA: Insufficient documentation

## 2014-06-06 DIAGNOSIS — Z7952 Long term (current) use of systemic steroids: Secondary | ICD-10-CM | POA: Insufficient documentation

## 2014-06-06 DIAGNOSIS — Z79899 Other long term (current) drug therapy: Secondary | ICD-10-CM | POA: Insufficient documentation

## 2014-06-06 DIAGNOSIS — Z792 Long term (current) use of antibiotics: Secondary | ICD-10-CM | POA: Insufficient documentation

## 2014-06-06 DIAGNOSIS — Z720 Tobacco use: Secondary | ICD-10-CM | POA: Insufficient documentation

## 2014-06-06 LAB — CBG MONITORING, ED: Glucose-Capillary: 102 mg/dL — ABNORMAL HIGH (ref 70–99)

## 2014-06-06 MED ORDER — DOXYCYCLINE HYCLATE 100 MG PO CAPS: 100.0000 mg | ORAL_CAPSULE | Freq: Two times a day (BID) | ORAL | Status: DC

## 2014-06-06 MED ORDER — OXYCODONE-ACETAMINOPHEN 5-325 MG PO TABS
2.0000 | ORAL_TABLET | Freq: Once | ORAL | Status: AC
  Administered 2014-06-06: 2 via ORAL
  Filled 2014-06-06: qty 2

## 2014-06-06 MED ORDER — HYDROCODONE-ACETAMINOPHEN 5-325 MG PO TABS: 1.0000 | ORAL_TABLET | ORAL | Status: DC | PRN

## 2014-06-06 MED ORDER — DOXYCYCLINE HYCLATE 100 MG PO TABS
100.0000 mg | ORAL_TABLET | Freq: Once | ORAL | Status: AC
  Administered 2014-06-06: 100 mg via ORAL
  Filled 2014-06-06: qty 1

## 2014-06-06 NOTE — Discharge Instructions (Signed)
Take vicodin for severe pain. Do NOT drive with it.   Take doxycycline twice daily for a week.   Follow up with your doctor   Return to ER if you have fever, worse swelling, severe pain.

## 2014-06-06 NOTE — ED Notes (Signed)
Bed: ZO10WA22 Expected date:  Expected time:  Means of arrival:  Comments: EMS testicular abscess

## 2014-06-06 NOTE — ED Notes (Signed)
Patient arrived to facility via EMS with complaints of groin pain.  He reports he had an abscess in that region recently and would like to have it re-evaluated.

## 2014-06-06 NOTE — ED Provider Notes (Signed)
CSN: [id number]     Arrival date & time 06/06/14  2023 History   First MD Initiated Contact with Patient 06/06/14 2121     Chief Complaint  Patient presents with  . Testicle Pain     (Consider location/radiation/quality/duration/timing/severity/associated sxs/prior Treatment) The history is provided by the patient.  [name] is a 44 y.o. male hx of anemia, alcohol abuse, depression, recent L perineal abscess here with possible abscess. He was here about a month ago and was diagnosed with superficial abscess in the perineal area around L testicle. He had I and D in the ED and was sent home with doxycycline. He states that his swelling and redness completely resolved. Over the last 2 days he noticed some mild redness in the left scrotal area. Denies any fevers or chills or testicular pain.     Past Medical History  Diagnosis Date  . Alcohol abuse   . Anemia   . Severe major depression with psychotic features   . Colitis    History reviewed. No pertinent past surgical history. Family History  Problem Relation Age of Onset  . Hypertension Mother   . Hypertension Father   . Hypertension Other    History  Substance Use Topics  . Smoking status: Current Every Day Smoker -- 0.25 packs/day    Types: Cigarettes  . Smokeless tobacco: Not on file  . Alcohol Use: No     Comment: former  last use1 year ago    Review of Systems  Skin: Positive for color change.  All other systems reviewed and are negative.     Allergies  Aspirin; Ibuprofen; Pork-derived products; and Sertraline  Home Medications   Prior to Admission medications   Medication Sig Start Date End Date Taking? Authorizing Provider  cephALEXin (KEFLEX) 500 MG capsule Take 1 capsule (500 mg total) by mouth 4 (four) times daily. 05/08/14   Kaitlyn Szekalski, PA-C  cyclobenzaprine (FLEXERIL) 10 MG tablet Take 1 tablet (10 mg total) by mouth 3 (three) times daily as needed for muscle spasms. 03/26/14   Mercedes Strupp  Camprubi-Soms, PA-C  HYDROcodone-acetaminophen (NORCO/VICODIN) 5-325 MG per tablet Take 2 tablets by mouth every 4 (four) hours as needed for moderate pain or severe pain. 05/08/14   Kaitlyn Szekalski, PA-C  IRON PO Take 1 tablet by mouth daily.    Historical Provider, MD  methocarbamol (ROBAXIN) 500 MG tablet Take 2 tablets (1,000 mg total) by mouth 2 (two) times daily. 04/05/14   Loren Racer, MD  naproxen (NAPROSYN) 500 MG tablet Take 1 tablet (500 mg total) by mouth 2 (two) times daily as needed for mild pain, moderate pain or headache (TAKE WITH MEALS.). 03/26/14   Mercedes Strupp Camprubi-Soms, PA-C  oxyCODONE-acetaminophen (PERCOCET/ROXICET) 5-325 MG per tablet Take 1-2 tablets by mouth every 4 (four) hours as needed for moderate pain or severe pain. 05/10/14   Trixie Dredge, PA-C  predniSONE (DELTASONE) 10 MG tablet Take 2 tablets (20 mg total) by mouth daily. 03/03/14   Benny Lennert, MD   BP 142/75 mmHg  Pulse 98  Temp(Src) 98.6 F (37 C) (Oral)  Resp 16  SpO2 98% Physical Exam  Constitutional: He is oriented to person, place, and time.  Uncomfortable   HENT:  Head: Normocephalic.  Eyes: Pupils are equal, round, and reactive to light.  Neck: Normal range of motion. Neck supple.  Cardiovascular: Normal rate.   Pulmonary/Chest: Effort normal.  Abdominal: Soft.  Genitourinary:  L testicle nontender. Erythema L lower scrotum and L perineum.  No obvious fluctuance.   Musculoskeletal: Normal range of motion. He exhibits no edema or tenderness.  Neurological: He is alert and oriented to person, place, and time. No cranial nerve deficit. Coordination normal.  Skin: Skin is warm and dry.  Psychiatric: He has a normal mood and affect. His behavior is normal. Judgment and thought content normal.  Nursing note and vitals reviewed.   ED Course  Procedures (including critical care time) Labs Review Labs Reviewed  CBG MONITORING, ED - Abnormal; Notable for the following:     Glucose-Capillary 102 (*)    All other components within normal limits    EMERGENCY DEPARTMENT US SOFT TISSUE INTERPRETATION "Study: Limited Ultrasound of the noted body part in comments below"  INDICATIONS: Pain Multiple views of the body part are obtained with a multi-frequency linear probe  PERFORMED BY:  Myself  IMAGES ARCHIVED?: Yes  SIDE:Left  BODY PART:Other soft tisse (comment in note)  FINDINGS: No abcess noted and Cellulitis present  LIMITATIONS:  Body Habitus  INTERPRETATION:  No abcess noted and Cellulitis present  COMMENT:  + cellulitis no abscess    Imaging Review No results found.   EKG Interpretation None      MDM   Final diagnoses:  None    [name] is a 44 y.o. male here with L perineal cellulitis. No evidence of abscess on US. Will get CBG to make sure he is not diabetic and likely dc home with doxy and pain meds.   9:50 PM CBG 102. Stable for d/c.    Richardean Canalavid H Athelene Hursey, MD 06/06/14 2150

## 2014-06-08 ENCOUNTER — Emergency Department (HOSPITAL_COMMUNITY)
Admission: EM | Admit: 2014-06-08 | Discharge: 2014-06-09 | Disposition: A | Discharge status: Other Acute Inpatient Hospital | Payer: Self-pay | Attending: Emergency Medicine | Admitting: Emergency Medicine

## 2014-06-08 ENCOUNTER — Encounter (HOSPITAL_COMMUNITY): Payer: Self-pay | Admitting: Emergency Medicine

## 2014-06-08 DIAGNOSIS — Z720 Tobacco use: Secondary | ICD-10-CM | POA: Insufficient documentation

## 2014-06-08 DIAGNOSIS — Z7952 Long term (current) use of systemic steroids: Secondary | ICD-10-CM | POA: Insufficient documentation

## 2014-06-08 DIAGNOSIS — D649 Anemia, unspecified: Secondary | ICD-10-CM | POA: Insufficient documentation

## 2014-06-08 DIAGNOSIS — F419 Anxiety disorder, unspecified: Secondary | ICD-10-CM | POA: Insufficient documentation

## 2014-06-08 DIAGNOSIS — Z046 Encounter for general psychiatric examination, requested by authority: Secondary | ICD-10-CM | POA: Insufficient documentation

## 2014-06-08 DIAGNOSIS — Z792 Long term (current) use of antibiotics: Secondary | ICD-10-CM | POA: Insufficient documentation

## 2014-06-08 DIAGNOSIS — F332 Major depressive disorder, recurrent severe without psychotic features: Secondary | ICD-10-CM | POA: Insufficient documentation

## 2014-06-08 DIAGNOSIS — Z79899 Other long term (current) drug therapy: Secondary | ICD-10-CM | POA: Insufficient documentation

## 2014-06-08 DIAGNOSIS — Z008 Encounter for other general examination: Secondary | ICD-10-CM

## 2014-06-08 DIAGNOSIS — Z8719 Personal history of other diseases of the digestive system: Secondary | ICD-10-CM | POA: Insufficient documentation

## 2014-06-08 LAB — CBC
HCT: 39.5 % (ref 39.0–52.0)
Hemoglobin: 12.7 g/dL — ABNORMAL LOW (ref 13.0–17.0)
MCH: 29.6 pg (ref 26.0–34.0)
MCHC: 32.2 g/dL (ref 30.0–36.0)
MCV: 92.1 fL (ref 78.0–100.0)
PLATELETS: 218 10*3/uL (ref 150–400)
RBC: 4.29 MIL/uL (ref 4.22–5.81)
RDW: 13.4 % (ref 11.5–15.5)
WBC: 6.7 10*3/uL (ref 4.0–10.5)

## 2014-06-08 NOTE — ED Notes (Signed)
Pt arrived to the ED with a complaint of suicidal ideations.  Pt states he has a past history of depression.  Pt has stated that he has been hearing voices.  Pt states that they are telling him to kill himself.  Pt also had an abscess under his left testicle which was lanced but patient feels that the boil is returning.

## 2014-06-08 NOTE — ED Provider Notes (Signed)
CSN: [id number]     Arrival date & time 06/08/14  2232 History  This chart was scribed for non-physician practitioner Antony Madura, PA-C working with Hanley Seamen, MD by Annye Asa, ED Scribe. This patient was seen in room WTR3/WLPT3 and the patient's care was started at 11:16 PM.   Chief Complaint  Patient presents with  . Suicidal   The history is provided by the patient. No language interpreter was used.     HPI Comments: [name] is a 44 y.o. male who presents to the Emergency Department complaining of SI. Patient reports he has a history of SI but it has been "a very long time" since his last episode of similar thoughts, and that recent thoughts of SI have been "very bad." He has been under some significant stress at home; romantic complications, parenting difficulties, familial concerns. He feels his support system in the area is lacking. He denies any auditory hallucinations at this time, but describes the feelings as intrusive "bad thoughts." He denies HI.   He is 15 months sober at this time; he denies EtOH or marijuana use.   Past Medical History  Diagnosis Date  . Alcohol abuse   . Anemia   . Severe major depression with psychotic features   . Colitis    History reviewed. No pertinent past surgical history. Family History  Problem Relation Age of Onset  . Hypertension Mother   . Hypertension Father   . Hypertension Other    History  Substance Use Topics  . Smoking status: Current Every Day Smoker -- 0.25 packs/day    Types: Cigarettes  . Smokeless tobacco: Not on file  . Alcohol Use: No     Comment: former  last use1 year ago    Review of Systems  Psychiatric/Behavioral: Positive for suicidal ideas.    Allergies  Aspirin; Ibuprofen; Pork-derived products; and Sertraline  Home Medications   Prior to Admission medications   Medication Sig Start Date End Date Taking? Authorizing Provider  cephALEXin (KEFLEX) 500 MG capsule Take 1 capsule (500 mg total)  by mouth 4 (four) times daily. 05/08/14   Kaitlyn Szekalski, PA-C  cyclobenzaprine (FLEXERIL) 10 MG tablet Take 1 tablet (10 mg total) by mouth 3 (three) times daily as needed for muscle spasms. 03/26/14   Mercedes Strupp Camprubi-Soms, PA-C  doxycycline (VIBRAMYCIN) 100 MG capsule Take 1 capsule (100 mg total) by mouth 2 (two) times daily. One po bid x 7 days 06/06/14   Richardean Canal, MD  HYDROcodone-acetaminophen (NORCO/VICODIN) 5-325 MG per tablet Take 1-2 tablets by mouth every 4 (four) hours as needed for moderate pain or severe pain. 06/06/14   Richardean Canal, MD  IRON PO Take 1 tablet by mouth daily.    Historical Provider, MD  methocarbamol (ROBAXIN) 500 MG tablet Take 2 tablets (1,000 mg total) by mouth 2 (two) times daily. 04/05/14   Loren Racer, MD  naproxen (NAPROSYN) 500 MG tablet Take 1 tablet (500 mg total) by mouth 2 (two) times daily as needed for mild pain, moderate pain or headache (TAKE WITH MEALS.). 03/26/14   Mercedes Strupp Camprubi-Soms, PA-C  oxyCODONE-acetaminophen (PERCOCET/ROXICET) 5-325 MG per tablet Take 1-2 tablets by mouth every 4 (four) hours as needed for moderate pain or severe pain. 05/10/14   Trixie Dredge, PA-C  predniSONE (DELTASONE) 10 MG tablet Take 2 tablets (20 mg total) by mouth daily. 03/03/14   Benny Lennert, MD   BP 137/93 mmHg  Pulse 79  Temp(Src) 98.2 F (  36.8 C) (Oral)  Resp 17  SpO2 99%   Physical Exam  Constitutional: He is oriented to person, place, and time. He appears well-developed and well-nourished. No distress.  HENT:  Head: Normocephalic and atraumatic.  Eyes: Conjunctivae and EOM are normal. No scleral icterus.  Neck: Normal range of motion.  Cardiovascular: Normal rate, regular rhythm and intact distal pulses.   Pulmonary/Chest: Effort normal. No respiratory distress.  Musculoskeletal: Normal range of motion.  Neurological: He is alert and oriented to person, place, and time. He exhibits normal muscle tone. Coordination normal.  Skin: Skin  is warm and dry. No rash noted. He is not diaphoretic. No erythema. No pallor.  Psychiatric: His speech is normal and behavior is normal. His mood appears anxious. He expresses suicidal ideation. He expresses no homicidal ideation. He expresses suicidal plans. He expresses no homicidal plans.  Nursing note and vitals reviewed.   ED Course  Procedures   DIAGNOSTIC STUDIES: Oxygen Saturation is 99% on RA, normal by my interpretation.    COORDINATION OF CARE: 11:26 PM Discussed treatment plan with pt at bedside and pt agreed to plan.   Labs Review Labs Reviewed  ACETAMINOPHEN LEVEL - Abnormal; Notable for the following:    Acetaminophen (Tylenol), Serum <10.0 (*)    All other components within normal limits  CBC - Abnormal; Notable for the following:    Hemoglobin 12.7 (*)    All other components within normal limits  COMPREHENSIVE METABOLIC PANEL - Abnormal; Notable for the following:    Alkaline Phosphatase 38 (*)    Anion gap 4 (*)    All other components within normal limits  ETHANOL  SALICYLATE LEVEL  URINE RAPID DRUG SCREEN (HOSP PERFORMED)    Imaging Review No results found.   EKG Interpretation None      MDM   Final diagnoses:  Encounter for medical clearance for patient hold  Major depressive disorder, recurrent, severe without psychotic features    44 year old male presents to the emergency department for further evaluation of suicidal ideations. He has no additional complaints. Patient has been medically cleared and evaluated by TTS. He meets criteria for inpatient treatment. [facility].  I personally performed the services described in this documentation, which was scribed in my presence. The recorded information has been reviewed and is accurate.   Filed Vitals:   06/08/14 2300 06/09/14 0142  BP: 128/74 137/93  Pulse: 90 79  Temp: 98.4 F (36.9 C) 98.2 F (36.8 C)  TempSrc: Oral Oral  Resp:  18 17  SpO2: 99% 99%       Antony MaduraKelly Randen Kauth, PA-C 06/09/14 0205  Hanley SeamenJohn L Molpus, MD 06/09/14 [phone]

## 2014-06-09 ENCOUNTER — Inpatient Hospital Stay (HOSPITAL_COMMUNITY)
Admission: AD | Admit: 2014-06-09 | Discharge: 2014-06-13 | DRG: 885 | Disposition: A | Discharge status: Home / Self Care | Payer: Federal, State, Local not specified - Other | Source: Intra-hospital | Attending: Psychiatry | Admitting: Psychiatry

## 2014-06-09 ENCOUNTER — Encounter (HOSPITAL_COMMUNITY): Payer: Self-pay | Admitting: *Deleted

## 2014-06-09 DIAGNOSIS — F17210 Nicotine dependence, cigarettes, uncomplicated: Secondary | ICD-10-CM | POA: Diagnosis present

## 2014-06-09 DIAGNOSIS — F1020 Alcohol dependence, uncomplicated: Secondary | ICD-10-CM | POA: Diagnosis present

## 2014-06-09 DIAGNOSIS — Z599 Problem related to housing and economic circumstances, unspecified: Secondary | ICD-10-CM | POA: Diagnosis not present

## 2014-06-09 DIAGNOSIS — F332 Major depressive disorder, recurrent severe without psychotic features: Secondary | ICD-10-CM | POA: Diagnosis present

## 2014-06-09 DIAGNOSIS — G4700 Insomnia, unspecified: Secondary | ICD-10-CM | POA: Diagnosis present

## 2014-06-09 DIAGNOSIS — F419 Anxiety disorder, unspecified: Secondary | ICD-10-CM | POA: Diagnosis present

## 2014-06-09 DIAGNOSIS — R45851 Suicidal ideations: Secondary | ICD-10-CM | POA: Diagnosis present

## 2014-06-09 DIAGNOSIS — Z886 Allergy status to analgesic agent status: Secondary | ICD-10-CM

## 2014-06-09 DIAGNOSIS — Z8249 Family history of ischemic heart disease and other diseases of the circulatory system: Secondary | ICD-10-CM | POA: Diagnosis not present

## 2014-06-09 LAB — SALICYLATE LEVEL: Salicylate Lvl: <4 mg/dL (ref 2.8–20.0)

## 2014-06-09 LAB — COMPREHENSIVE METABOLIC PANEL
ALK PHOS: 38 U/L — AB (ref 39–117)
ALT: 12 U/L (ref 0–53)
ANION GAP: 4 — AB (ref 5–15)
AST: 22 U/L (ref 0–37)
Albumin: 4.7 g/dL (ref 3.5–5.2)
BILIRUBIN TOTAL: 1 mg/dL (ref 0.3–1.2)
BUN: 14 mg/dL (ref 6–23)
CO2: 31 mmol/L (ref 19–32)
CREATININE: 0.93 mg/dL (ref 0.50–1.35)
Calcium: 9.6 mg/dL (ref 8.4–10.5)
Chloride: 105 mmol/L (ref 96–112)
GFR calc Af Amer: >90 mL/min (ref >90)
GFR calc non Af Amer: >90 mL/min (ref >90)
Glucose, Bld: 93 mg/dL (ref 70–99)
POTASSIUM: 3.5 mmol/L (ref 3.5–5.1)
Sodium: 140 mmol/L (ref 135–145)
Total Protein: 8.1 g/dL (ref 6.0–8.3)

## 2014-06-09 LAB — RAPID URINE DRUG SCREEN, HOSP PERFORMED
Amphetamines: NOT DETECTED
Barbiturates: NOT DETECTED
Benzodiazepines: NOT DETECTED
COCAINE: NOT DETECTED
OPIATES: NOT DETECTED
TETRAHYDROCANNABINOL: NOT DETECTED

## 2014-06-09 LAB — ACETAMINOPHEN LEVEL

## 2014-06-09 LAB — ETHANOL: Alcohol, Ethyl (B): <5 mg/dL (ref 0–9)

## 2014-06-09 MED ORDER — DOXYCYCLINE HYCLATE 100 MG PO TABS
100.0000 mg | ORAL_TABLET | Freq: Two times a day (BID) | ORAL | Status: DC
  Administered 2014-06-09 – 2014-06-13 (×9): 100 mg via ORAL
  Filled 2014-06-09 (×11): qty 1

## 2014-06-09 MED ORDER — ACETAMINOPHEN 325 MG PO TABS
650.0000 mg | ORAL_TABLET | Freq: Four times a day (QID) | ORAL | Status: DC | PRN
  Administered 2014-06-13: 650 mg via ORAL
  Filled 2014-06-09: qty 2

## 2014-06-09 MED ORDER — ALUM & MAG HYDROXIDE-SIMETH 200-200-20 MG/5ML PO SUSP: 30.0000 mL | ORAL | Status: DC | PRN

## 2014-06-09 MED ORDER — MAGNESIUM HYDROXIDE 400 MG/5ML PO SUSP: 30.0000 mL | Freq: Every day | ORAL | Status: DC | PRN

## 2014-06-09 MED ORDER — TRAZODONE HCL 50 MG PO TABS
50.0000 mg | ORAL_TABLET | Freq: Every evening | ORAL | Status: DC | PRN
Start: 2014-06-09 — End: 2014-06-14
  Administered 2014-06-12: 50 mg via ORAL
  Filled 2014-06-09 (×2): qty 1
  Filled 2014-06-09: qty 14

## 2014-06-09 NOTE — Progress Notes (Signed)
Psychoeducational Group Note  Date: 06/09/2014 Time:  1015  Group Topic/Focus:  Identifying Needs:   The focus of this group is to help patients identify their personal needs that have been historically problematic and identify healthy behaviors to address their needs.  Participation Level: Did not attend   \Marybelle Giraldo A 

## 2014-06-09 NOTE — Progress Notes (Deleted)
Patient ID: [name] FischerRashaan May, male   DOB: 09/11/1970, 44 y.o.   MRN: [id number] LCSW was asked to call patient's aunt, [name] at [phone]. Aunt expressed dismay as she has guardianship for patient and was unaware [facility]. Aunt expressed further dismay that patient was admitted to adult unit as she considers patient to be ill equipped for that environment and requests patient be moved to child and adolescent unit.  AC has arranged for patient to be moved to C&A unit and aunt has been informed and intends to visit patient tonight as soon as she gets off work. Staff aware that aunt will be arriving after visiting hours and intention is for aunt to have brief visit with patient.  Carney Bernatherine C Harrill, LCSW

## 2014-06-09 NOTE — Tx Team (Signed)
Initial Interdisciplinary Treatment Plan   PATIENT STRESSORS: Marital or family conflict   PATIENT STRENGTHS: Ability for insight Average or above average intelligence Supportive family/friends Work skills   PROBLEM LIST: Problem List/Patient Goals Date to be addressed Date deferred Reason deferred Estimated date of resolution  Depression 06/09/14     Suicidal Ideation 06/09/14     "get rid of intrusive bad thoughts"                                           DISCHARGE CRITERIA:  Adequate post-discharge living arrangements Improved stabilization in mood, thinking, and/or behavior Motivation to continue treatment in a less acute level of care Need for constant or close observation no longer present Verbal commitment to aftercare and medication compliance  PRELIMINARY DISCHARGE PLAN: Participate in family therapy Placement in alternative living arrangements  PATIENT/FAMIILY INVOLVEMENT: This treatment plan has been presented to and reviewed with the patient, Albin FischerRashaan Lira.  The patient and family have been given the opportunity to ask questions and make suggestions.  Juliann ParesBowman, Amadi Frady Elizabeth 06/09/2014, 4:04 AM
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
## 2014-06-09 NOTE — BH Assessment (Signed)
Assessment completed. Consulted Alberteen SamFran Hobson, NP who recommended inpatient treatment. [facility] 2 to the services of Dr. Dub MikesLugo.  Antony MaduraKelly Humes, PA-C has been informed of the recommendation.
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
## 2014-06-09 NOTE — BH Assessment (Addendum)
Tele Assessment Note   [name] is an 44 y.o. male presenting to [facility] ED reporting suicidal ideations. Pt stated "I have been having really bad thoughts". "No to many suicidal thoughts, more so thoughts to harm myself". "The thoughts come and go". "I probably would have did it if I didn't call for help". "The thoughts were getting stronger but I didn't have a major plan; but my first impulse would be to cut myself". "I feel like it's too much to bear". Pt is endorsing suicidal ideations but denies having an active plan. Pt stated "my first impulse would be to cut myself". Pt did not report any previous suicide attempts. Pt shared that he was hospitalized several times in the past. Pt is not receiving any current mental health treatment. Pt is endorsing multiple depressive symptoms and shared that he his dealing with multiple stressors. Pt shared that his oldest daughter has swelling on her brain and is unable to walk, talk or feed herself, his father being sick and needing a liver transplant, his fianc cheating on his and some financial stressors. Pt denies HI and AVH at this time. Pt did not report any alcohol or illicit substance use and stated "today marks 15 months that I have been clean". Pt denied having access to firearms but shared that he does have a knife. Pt did not report any physical, sexual or emotional abuse at this time. Pt is unable to reliably contract for safety at this time. Pt stated "I don't want you all to be reading about me tomorrow". Inpatient treatment has been recommended.   Axis I: Major Depression, Recurrent severe  Past Medical History:  Past Medical History  Diagnosis Date  . Alcohol abuse   . Anemia   . Severe major depression with psychotic features   . Colitis     History reviewed. No pertinent past surgical history.  Family History:  Family History  Problem Relation Age of Onset  . Hypertension Mother   . Hypertension Father   . Hypertension Other      Social History:  reports that he has been smoking Cigarettes.  He has been smoking about 0.25 packs per day. He does not have any smokeless tobacco history on file. He reports that he does not drink alcohol or use illicit drugs.  Additional Social History:  Alcohol / Drug Use History of alcohol / drug use?: No history of alcohol / drug abuse (Pt reported that he has been in recovery for15 mos. )  CIWA: CIWA-Ar BP: 128/74 mmHg Pulse Rate: 90 COWS:    PATIENT STRENGTHS: (choose at least two) Average or above average intelligence Motivation for treatment/growth  Allergies:  Allergies  Allergen Reactions  . Aspirin Hives and Nausea And Vomiting  . Ibuprofen Hives and Nausea And Vomiting    Vomiting  . Pork-Derived Products     Stomach pain   . Sertraline Other (See Comments)    Acute GI distress with diarrhea and pain.     Home Medications:  (Not in a hospital admission)  OB/GYN Status:  No LMP for male patient.  General Assessment Data Location of Assessment: [facility] ED Is this a Tele or Face-to-Face Assessment?: Face-to-Face Is this an Initial Assessment or a Re-assessment for this encounter?: Initial Assessment Living Arrangements: Spouse/significant other Can pt return to current living arrangement?: Yes Admission Status: Voluntary Is patient capable of signing voluntary admission?: Yes Transfer from: Home Referral Source: Self/Family/Friend     [facility] Crisis Care Plan Living Arrangements:  Spouse/significant other Name of Psychiatrist: No provider reported at at this time.  Name of Therapist: No provider reported at this time.   Education Status Is patient currently in school?: No  Risk to self with the past 6 months Suicidal Ideation: Yes-Currently Present Suicidal Intent: Yes-Currently Present Is patient at risk for suicide?: Yes Suicidal Plan?: Yes-Currently Present Specify Current Suicidal Plan: Pt reported that he had a knife on him.  Access to Means:  Yes Specify Access to Suicidal Means: Pt reported that he had a knife on his person. What has been your use of drugs/alcohol within the last 12 months?: No alcohol and drug use reported.  Previous Attempts/Gestures: No How many times?: 0 Other Self Harm Risks: No other self harm risk identified at this time.  Triggers for Past Attempts: None known Intentional Self Injurious Behavior: None Family Suicide History: No Recent stressful life event(s): Financial Problems, Other (Comment) (Medical problems; family medical issues) Persecutory voices/beliefs?: No Depression: Yes Depression Symptoms: Despondent, Tearfulness, Isolating, Fatigue, Guilt, Feeling worthless/self pity Substance abuse history and/or treatment for substance abuse?: No Suicide prevention information given to non-admitted patients: Not applicable  Risk to Others within the past 6 months Homicidal Ideation: No Thoughts of Harm to Others: No Current Homicidal Intent: No Current Homicidal Plan: No Access to Homicidal Means: No Identified Victim: NA History of harm to others?: No Assessment of Violence: On admission Violent Behavior Description: No violent behaviors observed. Pt is calm and cooperative at this time.  Does patient have access to weapons?: Yes (Comment) (Knives) Criminal Charges Pending?: No Does patient have a court date: No  Psychosis Hallucinations: None noted Delusions: None noted  Mental Status Report Appear/Hygiene: In scrubs Eye Contact: Good Motor Activity: Freedom of movement Speech: Logical/coherent Level of Consciousness: Alert Mood: Depressed Affect: Appropriate to circumstance Anxiety Level: None Thought Processes: Coherent, Relevant Judgement: Unimpaired Orientation: Person, Situation, Place, Time Obsessive Compulsive Thoughts/Behaviors: None  Cognitive Functioning Concentration: Normal Memory: Recent Intact, Remote Intact IQ: Average Insight: Fair Impulse Control:  Fair Appetite: Good Weight Loss: 0 Weight Gain: 0 Sleep: No Change Total Hours of Sleep: 8 Vegetative Symptoms: None  ADLScreening [facility]) Patient's cognitive ability adequate to safely complete daily activities?: Yes Patient able to express need for assistance with ADLs?: Yes Independently performs ADLs?: Yes (appropriate for developmental age)  Prior Inpatient Therapy Prior Inpatient Therapy: Yes Prior Therapy Dates: 2014 [facility] Reason for Treatment: Depression  Prior Outpatient Therapy Prior Outpatient Therapy: No  ADL Screening (condition at time of admission) Patient's cognitive ability adequate to safely complete daily activities?: Yes Patient able to express need for assistance with ADLs?: Yes Independently performs ADLs?: Yes (appropriate for developmental age)       Abuse/Neglect Assessment (Assessment to be complete while patient is alone) Physical Abuse: Denies Verbal Abuse: Denies Sexual Abuse: Denies Exploitation of patient/patient's resources: Denies Self-Neglect: Denies     Merchant navy officerAdvance Directives (For Healthcare) Does patient have an advance directive?: No Would patient like information on creating an advanced directive?: No - patient declined information    Additional Information 1:1 In Past 12 Months?: No CIRT Risk: No Elopement Risk: No Does patient have medical clearance?: Yes     Disposition:  Disposition Initial Assessment Completed for this Encounter: Yes Disposition of Patient: Inpatient treatment program  Adea Geisel S 06/09/2014 1:16 AM
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
## 2014-06-09 NOTE — Progress Notes (Signed)
Goals  Group Note  Date:  06/09/2014 Time:  1000  Group Topic/Focus:  Goals Group:   The focus of this group is to help patients establish daily goals to achieve during treatment and discuss how the patient can incorporate goal setting into their daily lives to aide in recovery.  Participation Level: Did Not Attend  Participation Quality:  Not Applicable  Affect:  Not Applicable  Cognitive:  Not Applicable  Insight:  Not Applicable  Engagement in Group: Not Applicable  Additional Comments:    Rich BraveDuke, Annalis Kaczmarczyk Lynn 06/09/2014, 10:41 AM

## 2014-06-09 NOTE — [facility] Group Notes (Signed)
[facility] Group Notes:  (Clinical Social Work)  06/09/2014     1:45-2:30PM  Summary of Progress/Problems:   The main focus of today's process group was to learn how to use a decisional balance exercise to move forward in the Stages of Change.  Motivational Interviewing was utilized to help patients explore in depth the perceived benefits and costs of a self-sabotaging behavior, as well as the  benefits and costs of replacing that with a healthy coping mechanism.   The patient expressed that his unhealthy coping skills are holding things in and being a people pleaser.  He contributed frequently throughout the group, and asked a lot of questions.  Type of Therapy:  Group Therapy - Process   Participation Level:  Active  Participation Quality:  Attentive and Sharing  Affect:  Blunted  Cognitive:  Alert  Insight:  Limited  Engagement in Therapy:  Developing/Improving  Modes of Intervention:  Education, Motivational Interviewing  Ambrose MantleMareida Grossman-Orr, LCSW 06/09/2014, 3:15 PM

## 2014-06-09 NOTE — Progress Notes (Signed)
Patient ID: [name], male   DOB: 08/15/1970, 89 y.o.   MRN: [id number] Attempted to complete PSA with patient at 9:40 am and 11:10 am and found patient asleep on both occassions due to late admit. Will make additional attempt to meet with patient later today.  Carney Bernatherine C Harrill, LCSW
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
## 2014-06-09 NOTE — Progress Notes (Addendum)
[name] has spent most of his day in the quiet room on the [address] hall ( which is also where he slept last night).  He is flat, quiet - spoken and  Stays  back , from the rest of the patients.  He says he could not relax...could not get any sleep last night, with his roommate snoring, so he slept in the quiet room. He is adamant that he be allowed to stay in the quiet room while he is here, at night.   A. He does complete his self inventory questions and on it he says he has had positive SI within the past 24 hrs ( but he contracts with this nurse for safety) and he denies having any depression, hopelessness and anxiety.   R Safety is in place and poc cont.

## 2014-06-09 NOTE — H&P (Signed)
Psychiatric Admission Assessment Adult  Patient Identification: [name] MRN:  [id number] Date of Evaluation:  06/09/2014 Chief Complaint:  Depression,MDD Principal Diagnosis: MDD (major depressive disorder), recurrent severe, without psychosis Diagnosis:   Patient Active Problem List   Diagnosis Date Noted  . MDD (major depressive disorder), recurrent severe, without psychosis [F33.2] 06/09/2014  . 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 Alcohol dependence [F10.20] 06/08/2011  . Polysubstance abuse [F19.10] 05/31/2011   History of Present Illness: [name] is a 37 yo patient that has a history of substance abuse and has been to [facility] in 2014.  He reports sobriety for 15 mos and indeed UDS is negative for substances and BAC <5.  He has recently discovered that his fiance was cheating on him and he has decided to leave her.  He was living with her.  This made him have suicidal thoughts, "I was just thinking hard, I am not hearing voices to hurt me or others."  "I wanted to stab myself."  Denies VH.  Furthermore, other stressors to include a 15 year old daughter who is ill and a father recently who needs a liver transplant.     He denies being depressed and has deferred being started on anti depressants for now.  He truly feels that his thoughts were brought on by the barrage of circumstances in his life that came all at once.  He asserts that he has made it though tough times and he has been able to stay clean.      Elements:  Location:  Depression, Suicidal thoughts. Quality:  Hopeless, anxiety. Severity:  Severe, enought to develop SI. Timing:  In the last 3 days. Duration:  History ETOH. Context:  "I just had so many things come out at me.  I felt like stabbing myself". Associated Signs/Symptoms: Depression Symptoms:  depressed mood, suicidal thoughts with specific plan, anxiety, insomnia, (Hypo) Manic Symptoms:   Labiality of Mood, Anxiety Symptoms:  Social Anxiety, Psychotic Symptoms:  NA PTSD Symptoms: NA Total Time spent with patient: 30 minutes  Past Medical History:  Past Medical History  Diagnosis Date  . Alcohol abuse   . Anemia   . Severe major depression with psychotic features   . Colitis    History reviewed. No pertinent past surgical history. Family History:  Family History  Problem Relation Age of Onset  . Hypertension Mother   . Hypertension Father   . Hypertension Other    Social History:  History  Alcohol Use No    Comment: former  last use1 year ago     History  Drug Use No    Comment: former    History   Social History  . Marital Status: Single    Spouse Name: N/A    Number of Children: N/A  . Years of Education: N/A   Social History Main Topics  . Smoking status: Current Every Day Smoker -- 0.25 packs/day    Types: Cigarettes  . Smokeless tobacco: None  . Alcohol Use: No     Comment: former  last use1 year ago  . Drug Use: No     Comment: former  . Sexual Activity: Yes    Birth Control/ Protection: Condom   Other Topics Concern  . None   Social History Narrative   Additional Social History: Hx of [facility] hospital admission.  Disability application pending.  Odd jobs.  Excited to start new job.  He is from     History  of alcohol / drug use?: Yes Longest period of sobriety (when/how long): Has not had a drink in 15 months  Musculoskeletal: Strength & Muscle Tone: within normal limits Gait & Station: normal Patient leans: N/A  Psychiatric Specialty Exam: Physical Exam  Vitals reviewed. Psychiatric: His mood appears anxious.    Review of Systems  Constitutional: Negative.   HENT: Negative.   Eyes: Negative.   Respiratory: Negative.   Cardiovascular: Negative.   Gastrointestinal: Negative.   Genitourinary: Negative.   Musculoskeletal: Negative.   Skin: Negative.   Neurological: Negative.   Endo/Heme/Allergies: Negative.    Psychiatric/Behavioral: The patient is nervous/anxious.     Blood pressure 114/77, pulse 83, temperature 98.6 F (37 C), temperature source Oral, resp. rate 16, height 5' 8"  (1.727 m), weight 63.504 kg (140 lb).Body mass index is 21.29 kg/(m^2).  General Appearance: Fairly groomed  Engineer, water::  Fair  Speech:  Clear and Coherent  Volume:  Normal  Mood:  Anxious and Worthless  Affect:  Appropriate  Thought Process:  Intact  Orientation:  Full (Time, Place, and Person)  Thought Content:  Rumination  Suicidal Thoughts:  No  Homicidal Thoughts:  No  Memory:  Immediate;   Good Recent;   Fair Remote;   Fair  Judgement:  Fair  Insight:  Good  Psychomotor Activity:  Normal  Concentration:  Good  Recall:  Good  Fund of Knowledge:Good  Language: Good  Akathisia:  Negative  Handed:  Right  AIMS (if indicated):     Assets:  Desire for Improvement Physical Health Resilience  ADL's:  Intact  Cognition: WNL  Sleep:  Number of Hours: 1.5   Risk to Self: Is patient at risk for suicide?: Yes Risk to Others:   Prior Inpatient Therapy:   Prior Outpatient Therapy:    Alcohol Screening: 1. How often do you have a drink containing alcohol?: Never 9. Have you or someone else been injured as a result of your drinking?: No 10. Has a relative or friend or a doctor or another health worker been concerned about your drinking or suggested you cut down?: No Alcohol Use Disorder Identification Test Final Score (AUDIT): 0 Brief Intervention: AUDIT score less than 7 or less-screening does not suggest unhealthy drinking-brief intervention not indicated  Allergies:   Allergies  Allergen Reactions  . Aspirin Hives and Nausea And Vomiting  . Ibuprofen Hives and Nausea And Vomiting    Vomiting  . Pork-Derived Products     Stomach pain   . Sertraline Other (See Comments)    Acute GI distress with diarrhea and pain.    Lab Results:  Results for orders placed or performed during the hospital  encounter of 06/08/14 (from the past 48 hour(s))  Acetaminophen level     Status: Abnormal   Collection Time: 06/08/14 11:27 PM  Result Value Ref Range   Acetaminophen (Tylenol), Serum <10.0 (L) 10 - 30 ug/mL    Comment:        THERAPEUTIC CONCENTRATIONS VARY SIGNIFICANTLY. A RANGE OF 10-30 ug/mL MAY BE AN EFFECTIVE CONCENTRATION FOR MANY PATIENTS. HOWEVER, SOME ARE BEST TREATED AT CONCENTRATIONS OUTSIDE THIS RANGE. ACETAMINOPHEN CONCENTRATIONS >150 ug/mL AT 4 HOURS AFTER INGESTION AND >50 ug/mL AT 12 HOURS AFTER INGESTION ARE OFTEN ASSOCIATED WITH TOXIC REACTIONS.   CBC     Status: Abnormal   Collection Time: 06/08/14 11:27 PM  Result Value Ref Range   WBC 6.7 4.0 - 10.5 K/uL   RBC 4.29 4.22 - 5.81 MIL/uL   Hemoglobin 12.7 (  L) 13.0 - 17.0 g/dL   HCT 39.5 39.0 - 52.0 %   MCV 92.1 78.0 - 100.0 fL   MCH 29.6 26.0 - 34.0 pg   MCHC 32.2 30.0 - 36.0 g/dL   RDW 13.4 11.5 - 15.5 %   Platelets 218 150 - 400 K/uL  Comprehensive metabolic panel     Status: Abnormal   Collection Time: 06/08/14 11:27 PM  Result Value Ref Range   Sodium 140 135 - 145 mmol/L   Potassium 3.5 3.5 - 5.1 mmol/L   Chloride 105 96 - 112 mmol/L   CO2 31 19 - 32 mmol/L   Glucose, Bld 93 70 - 99 mg/dL   BUN 14 6 - 23 mg/dL   Creatinine, Ser 0.93 0.50 - 1.35 mg/dL   Calcium 9.6 8.4 - 10.5 mg/dL   Total Protein 8.1 6.0 - 8.3 g/dL   Albumin 4.7 3.5 - 5.2 g/dL   AST 22 0 - 37 U/L   ALT 12 0 - 53 U/L   Alkaline Phosphatase 38 (L) 39 - 117 U/L   Total Bilirubin 1.0 0.3 - 1.2 mg/dL   GFR calc non Af Amer >90 >90 mL/min   GFR calc Af Amer >90 >90 mL/min    Comment: (NOTE) The eGFR has been calculated using the CKD EPI equation. This calculation has not been validated in all clinical situations. eGFR's persistently <90 mL/min signify possible Chronic Kidney Disease.    Anion gap 4 (L) 5 - 15  Ethanol (ETOH)     Status: None   Collection Time: 06/08/14 11:27 PM  Result Value Ref Range   Alcohol, Ethyl (B)  <5 0 - 9 mg/dL    Comment:        LOWEST DETECTABLE LIMIT FOR SERUM ALCOHOL IS 11 mg/dL FOR MEDICAL PURPOSES ONLY   Salicylate level     Status: None   Collection Time: 06/08/14 11:27 PM  Result Value Ref Range   Salicylate Lvl <6.2 2.8 - 20.0 mg/dL  Urine Drug Screen     Status: None   Collection Time: 06/09/14 12:16 AM  Result Value Ref Range   Opiates NONE DETECTED NONE DETECTED   Cocaine NONE DETECTED NONE DETECTED   Benzodiazepines NONE DETECTED NONE DETECTED   Amphetamines NONE DETECTED NONE DETECTED   Tetrahydrocannabinol NONE DETECTED NONE DETECTED   Barbiturates NONE DETECTED NONE DETECTED    Comment:        DRUG SCREEN FOR MEDICAL PURPOSES ONLY.  IF CONFIRMATION IS NEEDED FOR ANY PURPOSE, NOTIFY LAB WITHIN 5 DAYS.        LOWEST DETECTABLE LIMITS FOR URINE DRUG SCREEN Drug Class       Cutoff (ng/mL) Amphetamine      1000 Barbiturate      200 Benzodiazepine   229 Tricyclics       798 Opiates          300 Cocaine          300 THC              50    Current Medications: Current Facility-Administered Medications  Medication Dose Route Frequency Provider Last Rate Last Dose  . acetaminophen (TYLENOL) tablet 650 mg  650 mg Oral Q6H PRN Lurena Nida, NP      . alum & mag hydroxide-simeth (MAALOX/MYLANTA) 200-200-20 MG/5ML suspension 30 mL  30 mL Oral Q4H PRN Lurena Nida, NP      . doxycycline (VIBRA-TABS) tablet 100 mg  100 mg Oral  Q12H Lurena Nida, NP   100 mg at 06/09/14 1258  . magnesium hydroxide (MILK OF MAGNESIA) suspension 30 mL  30 mL Oral Daily PRN Lurena Nida, NP      . traZODone (DESYREL) tablet 50 mg  50 mg Oral QHS PRN Lurena Nida, NP       PTA Medications: Prescriptions prior to admission  Medication Sig Dispense Refill Last Dose  . cephALEXin (KEFLEX) 500 MG capsule Take 1 capsule (500 mg total) by mouth 4 (four) times daily. 40 capsule 0   . cyclobenzaprine (FLEXERIL) 10 MG tablet Take 1 tablet (10 mg total) by mouth 3 (three) times daily  as needed for muscle spasms. 15 tablet 0   . doxycycline (VIBRAMYCIN) 100 MG capsule Take 1 capsule (100 mg total) by mouth 2 (two) times daily. One po bid x 7 days 14 capsule 0   . HYDROcodone-acetaminophen (NORCO/VICODIN) 5-325 MG per tablet Take 1-2 tablets by mouth every 4 (four) hours as needed for moderate pain or severe pain. 10 tablet 0   . IRON PO Take 1 tablet by mouth daily.   03/01/2014 at Unknown time  . methocarbamol (ROBAXIN) 500 MG tablet Take 2 tablets (1,000 mg total) by mouth 2 (two) times daily. 30 tablet 0   . naproxen (NAPROSYN) 500 MG tablet Take 1 tablet (500 mg total) by mouth 2 (two) times daily as needed for mild pain, moderate pain or headache (TAKE WITH MEALS.). 20 tablet 0   . oxyCODONE-acetaminophen (PERCOCET/ROXICET) 5-325 MG per tablet Take 1-2 tablets by mouth every 4 (four) hours as needed for moderate pain or severe pain. 15 tablet 0   . predniSONE (DELTASONE) 10 MG tablet Take 2 tablets (20 mg total) by mouth daily. 15 tablet 0    Previous Psychotropic Medications: Yes   Substance Abuse History in the last 12 months:  No.  Consequences of Substance Abuse: NA  Results for orders placed or performed during the hospital encounter of 06/08/14 (from the past 72 hour(s))  Acetaminophen level     Status: Abnormal   Collection Time: 06/08/14 11:27 PM  Result Value Ref Range   Acetaminophen (Tylenol), Serum <10.0 (L) 10 - 30 ug/mL    Comment:        THERAPEUTIC CONCENTRATIONS VARY SIGNIFICANTLY. A RANGE OF 10-30 ug/mL MAY BE AN EFFECTIVE CONCENTRATION FOR MANY PATIENTS. HOWEVER, SOME ARE BEST TREATED AT CONCENTRATIONS OUTSIDE THIS RANGE. ACETAMINOPHEN CONCENTRATIONS >150 ug/mL AT 4 HOURS AFTER INGESTION AND >50 ug/mL AT 12 HOURS AFTER INGESTION ARE OFTEN ASSOCIATED WITH TOXIC REACTIONS.   CBC     Status: Abnormal   Collection Time: 06/08/14 11:27 PM  Result Value Ref Range   WBC 6.7 4.0 - 10.5 K/uL   RBC 4.29 4.22 - 5.81 MIL/uL   Hemoglobin 12.7  (L) 13.0 - 17.0 g/dL   HCT 39.5 39.0 - 52.0 %   MCV 92.1 78.0 - 100.0 fL   MCH 29.6 26.0 - 34.0 pg   MCHC 32.2 30.0 - 36.0 g/dL   RDW 13.4 11.5 - 15.5 %   Platelets 218 150 - 400 K/uL  Comprehensive metabolic panel     Status: Abnormal   Collection Time: 06/08/14 11:27 PM  Result Value Ref Range   Sodium 140 135 - 145 mmol/L   Potassium 3.5 3.5 - 5.1 mmol/L   Chloride 105 96 - 112 mmol/L   CO2 31 19 - 32 mmol/L   Glucose, Bld 93 70 - 99 mg/dL  BUN 14 6 - 23 mg/dL   Creatinine, Ser 0.93 0.50 - 1.35 mg/dL   Calcium 9.6 8.4 - 10.5 mg/dL   Total Protein 8.1 6.0 - 8.3 g/dL   Albumin 4.7 3.5 - 5.2 g/dL   AST 22 0 - 37 U/L   ALT 12 0 - 53 U/L   Alkaline Phosphatase 38 (L) 39 - 117 U/L   Total Bilirubin 1.0 0.3 - 1.2 mg/dL   GFR calc non Af Amer >90 >90 mL/min   GFR calc Af Amer >90 >90 mL/min    Comment: (NOTE) The eGFR has been calculated using the CKD EPI equation. This calculation has not been validated in all clinical situations. eGFR's persistently <90 mL/min signify possible Chronic Kidney Disease.    Anion gap 4 (L) 5 - 15  Ethanol (ETOH)     Status: None   Collection Time: 06/08/14 11:27 PM  Result Value Ref Range   Alcohol, Ethyl (B) <5 0 - 9 mg/dL    Comment:        LOWEST DETECTABLE LIMIT FOR SERUM ALCOHOL IS 11 mg/dL FOR MEDICAL PURPOSES ONLY   Salicylate level     Status: None   Collection Time: 06/08/14 11:27 PM  Result Value Ref Range   Salicylate Lvl <2.9 2.8 - 20.0 mg/dL  Urine Drug Screen     Status: None   Collection Time: 06/09/14 12:16 AM  Result Value Ref Range   Opiates NONE DETECTED NONE DETECTED   Cocaine NONE DETECTED NONE DETECTED   Benzodiazepines NONE DETECTED NONE DETECTED   Amphetamines NONE DETECTED NONE DETECTED   Tetrahydrocannabinol NONE DETECTED NONE DETECTED   Barbiturates NONE DETECTED NONE DETECTED    Comment:        DRUG SCREEN FOR MEDICAL PURPOSES ONLY.  IF CONFIRMATION IS NEEDED FOR ANY PURPOSE, NOTIFY LAB WITHIN 5 DAYS.         LOWEST DETECTABLE LIMITS FOR URINE DRUG SCREEN Drug Class       Cutoff (ng/mL) Amphetamine      1000 Barbiturate      200 Benzodiazepine   562 Tricyclics       130 Opiates          300 Cocaine          300 THC              50     Observation Level/Precautions:  15 minute checks  Laboratory:  per ED  Psychotherapy:  Group therapy  Medications:  As per medlist  Consultations:  As needed  Discharge Concerns:  Safety  Estimated LOS:  5-7 days  Other:     Psychological Evaluations: Yes   Treatment Plan Summary: Daily contact with patient to assess and evaluate symptoms and progress in treatment and Medication management  Encourage group therapy  Medical Decision Making:  Established Problem, Stable/Improving (1), Discuss test with performing physician (1), Review and summation of old records (2), Review of Medication Regimen & Side Effects (2) and Review of New Medication or Change in Dosage (2)  I certify that inpatient services furnished can reasonably be expected to improve the patient's condition.   Kerrie Buffalo MAY, AGNP-BC 2/6/20163:33 PM I have examined the patient and agreed with the findings of H&P and treatment plan. I have also done suicide assessment on this patient.

## 2014-06-09 NOTE — ED Notes (Signed)
[facility] transport requested. 

## 2014-06-09 NOTE — ED Notes (Signed)
Pt presents with complaint of depression, SI.  Pt reports security took away his knife.  Denies HI or AV hallucinations.  Pt reports he has not ingested alcohol in past 15 mos.  Denies drug use, denies feeling hopeless, stating he feels worthless.  Pt calm & cooperative at present, no distress noted,  Will monitor for safety.  [facility].

## 2014-06-09 NOTE — ED Notes (Signed)
Report called to RN Marisue IvanLiz, [facility], rm [id number].  Pending [facility] transport.

## 2014-06-10 DIAGNOSIS — F332 Major depressive disorder, recurrent severe without psychotic features: Principal | ICD-10-CM

## 2014-06-10 NOTE — Progress Notes (Signed)
[facility] MD Progress Note  06/10/2014 11:08 AM [name]  MRN:  [id number]  Subjective:  "My back is hurting today"  O:  Patient came in with thoughts of hurting self after breaking up with his GF.  He is denying SI/HI/AVH today.  His main c/o back pain after previous back injury in the past.  He states that he is allergic to Tylenol.  Offered Lidoderm patch but deferred.  Also discussed Cymbalta for anti depression effects and also pain relieving qualities.  Patient deferred as well.  States that he will try Tylenol.    Principal Problem: MDD (major depressive disorder), recurrent severe, without psychosis Diagnosis:   Patient Active Problem List   Diagnosis Date Noted  . MDD (major depressive disorder), recurrent severe, without psychosis [F33.2] 06/09/2014  . 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 Alcohol dependence [F10.20] 06/08/2011  . Polysubstance abuse [F19.10] 05/31/2011   Total Time spent with patient: 30 minutes  Past Medical History:  Past Medical History  Diagnosis Date  . Alcohol abuse   . Anemia   . Severe major depression with psychotic features   . Colitis    History reviewed. No pertinent past surgical history. Family History:  Family History  Problem Relation Age of Onset  . Hypertension Mother   . Hypertension Father   . Hypertension Other    Social History:  History  Alcohol Use No    Comment: former  last use1 year ago     History  Drug Use No    Comment: former    History   Social History  . Marital Status: Single    Spouse Name: N/A    Number of Children: N/A  . Years of Education: N/A   Social History Main Topics  . Smoking status: Current Every Day Smoker -- 0.25 packs/day    Types: Cigarettes  . Smokeless tobacco: None  . Alcohol Use: No     Comment: former  last use1 year ago  . Drug Use: No     Comment: former  . Sexual Activity: Yes    Birth Control/ Protection: Condom    Other Topics Concern  . None   Social History Narrative   Additional History:    Sleep: Fair  Appetite:  Fair   Assessment:  [name] is a 44 yo presented to the ED with suicidal ideation and severe major depression.  He denies being depressed.  Will monitor for safety and mood re-stabilization.  Musculoskeletal: Strength & Muscle Tone: within normal limits Gait & Station: normal Patient leans: N/A   Psychiatric Specialty Exam: Physical Exam  Vitals reviewed. Psychiatric: His mood appears anxious. He exhibits a depressed mood.    Review of Systems  Constitutional: Negative.   Eyes: Negative.   Respiratory: Negative.   Cardiovascular: Negative.   Gastrointestinal: Negative.   Genitourinary: Negative.   Musculoskeletal: Positive for neck pain.  Skin: Negative.   Neurological: Negative.   Endo/Heme/Allergies: Negative.   Psychiatric/Behavioral: Positive for depression. The patient is nervous/anxious.     Blood pressure 125/76, pulse 74, temperature 98.4 F (36.9 C), temperature source Oral, resp. rate 16, height $RemoveBe'5\' 8"'mmpDZsgQx$  (1.727 m), weight 63.504 kg (140 lb).Body mass index is 21.29 kg/(m^2).   General Appearance: Fairly groomed  Engineer, water:: Fair  Speech: Clear and Coherent  Volume: Normal  Mood: Anxious and Worthless  Affect: Appropriate  Thought Process: Intact  Orientation: Full (Time, Place, and Person)  Thought Content: Rumination  Suicidal  Thoughts: No  Homicidal Thoughts: No  Memory: Immediate; Good Recent; Fair Remote; Fair  Judgement: Fair  Insight: Good  Psychomotor Activity: Normal  Concentration: Good  Recall: Good  Fund of Knowledge:Good  Language: Good  Akathisia: Negative  Handed: Right  AIMS (if indicated):    Assets: Desire for Improvement Physical Health Resilience  ADL's: Intact  Cognition: WNL  Sleep: Number of Hours: 5   Current Medications: Current  Facility-Administered Medications  Medication Dose Route Frequency Provider Last Rate Last Dose  . acetaminophen (TYLENOL) tablet 650 mg  650 mg Oral Q6H PRN Lurena Nida, NP      . alum & mag hydroxide-simeth (MAALOX/MYLANTA) 200-200-20 MG/5ML suspension 30 mL  30 mL Oral Q4H PRN Lurena Nida, NP      . doxycycline (VIBRA-TABS) tablet 100 mg  100 mg Oral Q12H Lurena Nida, NP   100 mg at 06/10/14 0843  . magnesium hydroxide (MILK OF MAGNESIA) suspension 30 mL  30 mL Oral Daily PRN Lurena Nida, NP      . traZODone (DESYREL) tablet 50 mg  50 mg Oral QHS PRN Lurena Nida, NP        Lab Results:  Results for orders placed or performed during the hospital encounter of 06/08/14 (from the past 48 hour(s))  Acetaminophen level     Status: Abnormal   Collection Time: 06/08/14 11:27 PM  Result Value Ref Range   Acetaminophen (Tylenol), Serum <10.0 (L) 10 - 30 ug/mL    Comment:        THERAPEUTIC CONCENTRATIONS VARY SIGNIFICANTLY. A RANGE OF 10-30 ug/mL MAY BE AN EFFECTIVE CONCENTRATION FOR MANY PATIENTS. HOWEVER, SOME ARE BEST TREATED AT CONCENTRATIONS OUTSIDE THIS RANGE. ACETAMINOPHEN CONCENTRATIONS >150 ug/mL AT 4 HOURS AFTER INGESTION AND >50 ug/mL AT 12 HOURS AFTER INGESTION ARE OFTEN ASSOCIATED WITH TOXIC REACTIONS.   CBC     Status: Abnormal   Collection Time: 06/08/14 11:27 PM  Result Value Ref Range   WBC 6.7 4.0 - 10.5 K/uL   RBC 4.29 4.22 - 5.81 MIL/uL   Hemoglobin 12.7 (L) 13.0 - 17.0 g/dL   HCT 39.5 39.0 - 52.0 %   MCV 92.1 78.0 - 100.0 fL   MCH 29.6 26.0 - 34.0 pg   MCHC 32.2 30.0 - 36.0 g/dL   RDW 13.4 11.5 - 15.5 %   Platelets 218 150 - 400 K/uL  Comprehensive metabolic panel     Status: Abnormal   Collection Time: 06/08/14 11:27 PM  Result Value Ref Range   Sodium 140 135 - 145 mmol/L   Potassium 3.5 3.5 - 5.1 mmol/L   Chloride 105 96 - 112 mmol/L   CO2 31 19 - 32 mmol/L   Glucose, Bld 93 70 - 99 mg/dL   BUN 14 6 - 23 mg/dL   Creatinine, Ser 0.93 0.50 -  1.35 mg/dL   Calcium 9.6 8.4 - 10.5 mg/dL   Total Protein 8.1 6.0 - 8.3 g/dL   Albumin 4.7 3.5 - 5.2 g/dL   AST 22 0 - 37 U/L   ALT 12 0 - 53 U/L   Alkaline Phosphatase 38 (L) 39 - 117 U/L   Total Bilirubin 1.0 0.3 - 1.2 mg/dL   GFR calc non Af Amer >90 >90 mL/[name]   GFR calc Af Amer >90 >90 mL/[name]    Comment: (NOTE) The eGFR has been calculated using the CKD EPI equation. This calculation has not been validated in all clinical situations. eGFR's persistently <90  mL/[name] signify possible Chronic Kidney Disease.    Anion gap 4 (L) 5 - 15  Ethanol (ETOH)     Status: None   Collection Time: 06/08/14 11:27 PM  Result Value Ref Range   Alcohol, Ethyl (B) <5 0 - 9 mg/dL    Comment:        LOWEST DETECTABLE LIMIT FOR SERUM ALCOHOL IS 11 mg/dL FOR MEDICAL PURPOSES ONLY   Salicylate level     Status: None   Collection Time: 06/08/14 11:27 PM  Result Value Ref Range   Salicylate Lvl <4.6 2.8 - 20.0 mg/dL  Urine Drug Screen     Status: None   Collection Time: 06/09/14 12:16 AM  Result Value Ref Range   Opiates NONE DETECTED NONE DETECTED   Cocaine NONE DETECTED NONE DETECTED   Benzodiazepines NONE DETECTED NONE DETECTED   Amphetamines NONE DETECTED NONE DETECTED   Tetrahydrocannabinol NONE DETECTED NONE DETECTED   Barbiturates NONE DETECTED NONE DETECTED    Comment:        DRUG SCREEN FOR MEDICAL PURPOSES ONLY.  IF CONFIRMATION IS NEEDED FOR ANY PURPOSE, NOTIFY LAB WITHIN 5 DAYS.        LOWEST DETECTABLE LIMITS FOR URINE DRUG SCREEN Drug Class       Cutoff (ng/mL) Amphetamine      1000 Barbiturate      200 Benzodiazepine   047 Tricyclics       998 Opiates          300 Cocaine          300 THC              50     Physical Findings: AIMS: Facial and Oral Movements Muscles of Facial Expression: None, normal Lips and Perioral Area: None, normal Jaw: None, normal Tongue: None, normal,Extremity Movements Upper (arms, wrists, hands, fingers): None, normal Lower (legs,  knees, ankles, toes): None, normal, Trunk Movements Neck, shoulders, hips: None, normal, Overall Severity Severity of abnormal movements (highest score from questions above): None, normal Incapacitation due to abnormal movements: None, normal Patient's awareness of abnormal movements (rate only patient's report): No Awareness, Dental Status Current problems with teeth and/or dentures?: No Does patient usually wear dentures?: No  CIWA:    COWS:     Treatment Plan Summary: Daily contact with patient to assess and evaluate symptoms and progress in treatment and Medication management   Medical Decision Making:  Established Problem, Stable/Improving (1), Discuss test with performing physician (1), Review and summation of old records (2) and Review of Medication Regimen & Side Effects (2)  AGUSTIN, [place], AGNP-BC 06/10/2014, 11:08 AM I agreed with findings and treatment plan of this patient

## 2014-06-10 NOTE — Progress Notes (Signed)
D: Pt denies SI/HI/AVH. Although patient denies AVH he has been seen responding to internal stimuli. Pt is pleasant and cooperative. Pt rates depression at a 0, anxiety at a 2, and Helplessness/hopelessness at a5.  A: Pt was offered support and encouragement. Pt was given scheduled medications. Pt was encourage to attend groups. Q 15 minute checks were done for safety.  R:Pt attends groups and interacts well with peers and staff. Pt taking medication. Pt has no complaints.Pt receptive to treatment and safety maintained on unit.

## 2014-06-10 NOTE — Progress Notes (Signed)
Patient did attend the evening speaker AA meeting.  

## 2014-06-10 NOTE — Progress Notes (Signed)
Adult Psychoeducational Group Note  Date:  06/10/2014 Time:  4:13 PM  Group Topic/Focus: Therapeutic activity    Participation Level:  Did Not Attend  Participation Quality:    Affect:   Cognitive:    Insight:   Engagement in Group:    Modes of Intervention:    Additional Comments: Pt did not attend group this afternoon.  Danni Shima A 06/10/2014, 4:13 PM

## 2014-06-10 NOTE — [facility] Group Notes (Signed)
[facility] Group Notes:  (Clinical Social Work)  06/10/2014  10:00-11:00AM  Summary of Progress/Problems:   The main focus of today's process group was to   1)  discuss the importance of adding supports  2)  define health supports versus unhealthy supports  3)  identify the patient's current unhealthy supports and plan how to handle them  4)  Identify the patient's current healthy supports and plan what to add.  An emphasis was placed on using counselor, doctor, therapy groups, 12-step groups, and problem-specific support groups to expand supports.    The patient expressed full comprehension of the concepts presented, and agreed that there is a need to add more supports.  The patient stated little in group but was able to identify healthy versus unhealthy supports.  He participated in numerous side conversations with another patient, and when asked several times to stop did not do so.  CSW became firm about this, and he left the room soon thereafter.  Type of Therapy:  Process Group with Motivational Interviewing  Participation Level:  Active  Participation Quality:  Attentive and Inattentive  Affect:  Blunted and Depressed  Cognitive:  Alert and Appropriate  Insight:  Developing/Improving  Engagement in Therapy: Developing/Improving  Modes of Intervention:   Education, Support and Processing, Activity  Ambrose MantleMareida Grossman-Orr, LCSW 06/10/2014, 12:15pm

## 2014-06-10 NOTE — Progress Notes (Signed)
D.  Pt sitting in dayroom on approach, no complaints voiced.  Guarded about taking medications.  Has watched Super Bowl in dayroom this evening with peers.  Continues to endorse positive passive SI but contracts for safety on the unit. Denies HI/hallucinations at this time.  A.  Support and encouragement offered  R.  Pt remains safe on unit.

## 2014-06-11 DIAGNOSIS — F1421 Cocaine dependence, in remission: Secondary | ICD-10-CM

## 2014-06-11 DIAGNOSIS — F329 Major depressive disorder, single episode, unspecified: Secondary | ICD-10-CM

## 2014-06-11 DIAGNOSIS — F1021 Alcohol dependence, in remission: Secondary | ICD-10-CM

## 2014-06-11 NOTE — Clinical Social Work Note (Signed)
CSW spoke w patient 1:1 at patient request, patient states that he is uncomfortable discussing his needs/plans in group setting.  Patient states that he is unsure about his housing at discharge, cannot return to prior living arrangement. Has no income, says he starts job training Thursday, will be paid towards end of month.  Does not want [facility], [facility] [place].  CSW discussed possibility of [facility], encouraged patient to examine his natural supports (patient mentioned church attendance at strength for him) for help.  [facility], but explained to patient that this is recovery housing and there is a cost for housing.  Patient agreeable to speaking w rep [facility].  Did not want to discuss aftercare for meds mgmt or therapy, states that he does not need meds and declines therapy providers including [facility] and [facility].    Santa GeneraAnne Emberleigh Reily, LCSW Clinical Social Worker

## 2014-06-11 NOTE — Tx Team (Signed)
Interdisciplinary Treatment Plan Update (Adult)   Date: 06/11/2014   Time Reviewed: 10:53 AM  Progress in Treatment:  Attending groups: No Participating in groups:  No Taking medication as prescribed: Yes  Tolerating medication: Yes  Family/Significant othe contact made: Not yet. SPE required for this pt.   Patient understands diagnosis: Yes, AEB seeking treatment for MDD, SI, AH, and for med management. Pt reports being clean from ETOH and THC for past 15 months.  Discussing patient identified problems/goals with staff: Yes  Medical problems stabilized or resolved: Yes  Denies suicidal/homicidal ideation: Yes during self report this morning/with RN Patient has not harmed self or Others: Yes  New problem(s) identified:  Discharge Plan or Barriers: CSW assessing. Pt currently has no o/p providers and is [facility]. [place] homeless. [place] Additional comments: [name] is a 44 yo patient that has a history of substance abuse and [facility]. He reports sobriety for 15 mos and indeed UDS is negative for substances and BAC <5. He has recently discovered that his fiance was cheating on him and he has decided to leave her. He was living with her. This made him have suicidal thoughts, "I was just thinking hard, I am not hearing voices to hurt me or others." "I wanted to stab myself." Denies VH. Furthermore, other stressors to include a 45 year old daughter who is ill and a father recently who needs a liver transplant. He denies being depressed and has deferred being started on anti depressants for now. He truly feels that his thoughts were brought on by the barrage of circumstances in his life that came all at once. He asserts that he has made it though tough times and he has been able to stay clean.  Reason for Continuation of Hospitalization: Mood instability/depression Medication management  Estimated length of stay: 3-5 days  For review of  initial/current patient goals, please see plan of care.  Attendees:  Patient:    Family:    Physician: Nehemiah MassedFernando Cobos, MD 06/11/2014 10:56 AM   Nursing: Ervin KnackVivian, Ronecia RN 06/11/2014 10:56 AM   Clinical Social Worker Elmond Poehlman Smart, LCSWA  06/11/2014 10:56 AM   Other: Santa GeneraAnne Cunningham, LCSWA 06/11/2014 10:56 AM   Other: Darden DatesJennifer C. Nurse CM 06/11/2014 10:56 AM   Other: Liliane Badeolora Sutton, Community Care Coordinator  06/11/2014 10:56 AM   Other: Vikki PortsValerieVesta Mixer: [facility] 06/11/2014 10:56 AM   Scribe for Treatment Team:  Herbert SetaHeather Smart LCSWA 06/11/2014 10:56 AM

## 2014-06-11 NOTE — [facility] Group Notes (Signed)
[facility]   06/11/2014 10:11 AM  Participation Quality:  Invited- DID NOT ATTEND. In bed sleeping.   Smart, American FinancialHeather LCSWA

## 2014-06-11 NOTE — Progress Notes (Signed)
D: Patient presents with appropriate affect and pleasant mood. He reported on the self inventory sheet that his sleep is fair, good appetite and ability to concentrate and normal energy level. Patient rates depression "5" and feelings of hopelessness/anxiety "0". His goal today is to stay humble and pray. Patient is attending groups, visible in the milieu and compliant with medication.  A: Support and encouragement provided to patient. Scheduled medication given per MD order. Maintain Q15 minute checks for safety.  R: Patient receptive. Denies SI/HI and AVH. Patient remains safe on the hall.

## 2014-06-11 NOTE — Progress Notes (Signed)
Patient ID: [name], male   DOB: March 28, 1971, 44 y.o.   MRN: [id number] [facility] MD Progress Note  06/11/2014 3:26 PM [name]  MRN:  [id number]  Subjective:  Patient reports he has been facing significant stressors. He states that he has a young adult daughter ( in [place]) who is now neurologically impaired following a viral encephalitis. He also states that he has had a lot of stress with his girlfriend related to her being unfaithful. States " I have been sober for more than a year and I do not want to relapse over the relationship. I have neglected my kids because I was drinking , using drugs, and now I want to stay clean to be a part of their lives ". He expresses worry that returning to status quo/ girlfriend would likely result in relapse.   O:   I have discussed case with treatment team and have met with patient. As noted, patient describes substance/alcohol abuse history now in sustained remission , and concern that current stressors could result in relapse. Behavior on unit is in good control- he presents calm, polite. Describes some depression related to his stressors, but currently denies any suicidal ideations and is future oriented, focusing on becoming more involved in his children's lives. At this time he is not on any standing psychiatric medications/ antidepressant. I discussed this with patient and we discussed options. He states he is not currently interested in starting a psychiatric medication. States " I am not much of a medication guy and I have been doing OK". Patient is wanting to consider the [facility] as an option after discharge. States " I can't go back with her or I know I will end up using again. I can't go to [facility] because I owe them money. 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 MDD (major depressive disorder), recurrent severe, without psychosis [F33.2] 06/09/2014  . 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 Alcohol dependence [F10.20] 06/08/2011  . Polysubstance abuse [F19.10] 05/31/2011   Total Time spent with patient: 25 minutes  Past Medical History:  Past Medical History  Diagnosis Date  . Alcohol abuse   . Anemia   . Severe major depression with psychotic features   . Colitis    History reviewed. No pertinent past surgical history. Family History:  Family History  Problem Relation Age of Onset  . Hypertension Mother   . Hypertension Father   . Hypertension Other    Social History:  History  Alcohol Use No    Comment: former  last use1 year ago     History  Drug Use No    Comment: former    History   Social History  . Marital Status: Single    Spouse Name: N/A    Number of Children: N/A  . Years of Education: N/A   Social History Main Topics  . Smoking status: Current Every Day Smoker -- 0.25 packs/day    Types: Cigarettes  . Smokeless tobacco: None  . Alcohol Use: No     Comment: former  last use1 year ago  . Drug Use: No     Comment: former  . Sexual Activity: Yes    Birth Control/ Protection: Condom   Other Topics Concern  .  None   Social History Narrative   Additional History:    Sleep: improved   Appetite: improved      Musculoskeletal: Strength & Muscle Tone: within normal limits Gait & Station: normal Patient leans: N/A   Psychiatric Specialty Exam: Physical Exam  Vitals reviewed. Psychiatric: His mood appears anxious. He exhibits a depressed mood.    Review of Systems  Constitutional: Negative for fever and chills.  Respiratory: Negative for cough and shortness of breath.   Cardiovascular: Negative for chest pain.  Gastrointestinal: Negative for vomiting and diarrhea.  Musculoskeletal:  Negative.        History of back pain   Skin: Negative for rash.  Neurological: Negative for headaches.  Psychiatric/Behavioral: Positive for depression and substance abuse.    Blood pressure 125/83, pulse 70, temperature 98.5 F (36.9 C), temperature source Oral, resp. rate 16, height 5' 8"  (1.727 m), weight 140 lb (63.504 kg).Body mass index is 21.29 kg/(m^2).   General Appearance: improved grooming   Eye Contact:: good   Speech: Clear and Coherent  Volume: Normal  Mood: improved , less depressed   Affect: Appropriate  Thought Process:linear, not disorganized   Orientation: Full (Time, Place, and Person)  Thought Content: Rumination about stressors as above, no hallucinations, no delusions  Suicidal Thoughts: No- at this time denies any plan or intention of hurting self or anyone else   Homicidal Thoughts: No  Memory:recent and remote grossly intact   Judgement: Fair  Insight:present   Psychomotor Activity: Normal  Concentration: Good  Recall: Good  Fund of Knowledge:Good  Language: Good  Akathisia: Negative  Handed: Right  AIMS (if indicated):    Assets: Desire for Improvement Physical Health Resilience  ADL's: Intact  Cognition: WNL  Sleep: Number of Hours: 5   Current Medications: Current Facility-Administered Medications  Medication Dose Route Frequency Provider Last Rate Last Dose  . acetaminophen (TYLENOL) tablet 650 mg  650 mg Oral Q6H PRN Lurena Nida, NP      . alum & mag hydroxide-simeth (MAALOX/MYLANTA) 200-200-20 MG/5ML suspension 30 mL  30 mL Oral Q4H PRN Lurena Nida, NP      . doxycycline (VIBRA-TABS) tablet 100 mg  100 mg Oral Q12H Lurena Nida, NP   100 mg at 06/11/14 0808  . magnesium hydroxide (MILK OF MAGNESIA) suspension 30 mL  30 mL Oral Daily PRN Lurena Nida, NP      . traZODone (DESYREL) tablet 50 mg  50 mg Oral QHS PRN Lurena Nida, NP        Lab Results:  No results found for this or any  previous visit (from the past 48 hour(s)).  Physical Findings: AIMS: Facial and Oral Movements Muscles of Facial Expression: None, normal Lips and Perioral Area: None, normal Jaw: None, normal Tongue: None, normal,Extremity Movements Upper (arms, wrists, hands, fingers): None, normal Lower (legs, knees, ankles, toes): None, normal, Trunk Movements Neck, shoulders, hips: None, normal, Overall Severity Severity of abnormal movements (highest score from questions above): None, normal Incapacitation due to abnormal movements: None, normal Patient's awareness of abnormal movements (rate only patient's report): No Awareness, Dental Status Current problems with teeth and/or dentures?: No Does patient usually wear dentures?: No  CIWA:    COWS:      Assessment- patient is improved compared to admission. States that he felt he was at high risk of relapsing ( sober now x 15 months) due to relationship conflict. Focused on becoming more of a presence in his daughters' lives.  He is currently not psychotic, not suicidal, mood seems improved, and he is future oriented. He is not wanting to start any psychiatric medication at this time.  Treatment Plan Summary: Daily contact with patient to assess and evaluate symptoms and progress in treatment and Medication management  As above, no current standing psychiatric medication regimen. Treatment team to determine if patient's request to go to [facility] after discharge is possible, and on ongoing outpatient follow up options.   Medical Decision Making:  Established Problem, Stable/Improving (1), Discuss test with performing physician (1), Review and summation of old records (2) and Review of Medication Regimen & Side Effects (2)  COBOS, FERNANDO 06/11/2014, 3:26 PM

## 2014-06-11 NOTE — Progress Notes (Signed)
Pt attended spiritual care group on grief and loss facilitated by chaplain Burnis KingfisherMatthew Herby Amick. Group opened with brief discussion and psycho-social ed around grief and loss in relationships and in relation to self - identifying life patterns, circumstances, changes that cause losses. Established group norm of speaking from own life experience. Group goal of establishing open and affirming space for members to share loss and experience with grief, normalize grief experience and provide psycho social education and grief support.  Group drew on narrative and [place]ian therapeutic modalities.     [name] was present throughout group.  He was attentive, but did not contribute to group discussion.    [name], [name] MDiv

## 2014-06-12 NOTE — Progress Notes (Signed)
Morning Wellness Group 0930  The focus of this group is to educate the patient on the purpose and policies of crisis stabilization and provide a format to answer questions about their admission.  The group details unit policies and expectations of patients while admitted. 

## 2014-06-12 NOTE — Progress Notes (Signed)
D   Pt was guarded and quiet on the unit    He interacts appropriately with others and is compliant with treatment   Pt is a little guarded and almost refused to take his antibiotic but then finally agreed to do so A   Verbal support given   Medications administered and effectiveness monitored   Q 15 min checks R   Pt safe at present

## 2014-06-12 NOTE — Progress Notes (Signed)
D   Pt was guarded and quiet on the unit    He interacts appropriately with others and is compliant with treatment   Pt is a little guarded and almost refused to take his antibiotic but then finally agreed to do so   Pt expressed concern over his discharge plans  He said the plans fell through and he wasn't sure what he was going to do   He said he has a place to live and a new job to start and classes A   Verbal support given   Medications administered and effectiveness monitored   Q 15 min checks   Encouraged pt to speak with his case manager and doctor in the morning R   Pt safe at present and pt verbalized understanding

## 2014-06-12 NOTE — Progress Notes (Signed)
D: Patient is pleasant and cooperative this morning.  He denies any depressive symptoms today.  He denies SI/HI/AVH.  He is interacting well with staff and his peers.  Patient is attending groups and is vested in his treatment.  His goal today is to "stay focused and prayed up."   A: Continue to monitor medication management and MD orders.  Safety checks completed every 15 minutes per protocol. Meet 1:1 with patient to address needs and offer encouragement. R: Patient's behavior is appropriate to situation.

## 2014-06-12 NOTE — [facility] Group Notes (Signed)
[facility] LCSW Group Therapy  06/12/2014 1:21 PM  Type of Therapy:  Group Therapy  Participation Level:  Active  Participation Quality:  Attentive  Affect:  Appropriate  Cognitive:  Alert and Oriented  Insight:  Improving  Engagement in Therapy:  Engaged  Modes of Intervention:  Confrontation, Discussion, Education, Problem-solving, Rapport Building, Socialization and Support  Summary of Progress/Problems: [facility] Speaker came to talk about his personal journey with substance abuse and addiction. The pt processed ways by which to relate to the speaker. [facility] speaker provided handouts and educational information pertaining to groups and services [facility] by [facility].   Smart, [name] LCSWA 06/12/2014, 1:21 PM

## 2014-06-12 NOTE — [facility] Group Notes (Signed)
[facility] LCSW Group Therapy  06/12/2014 12:37 PM  Type of Therapy:  Group Therapy  Participation Level:  Minimal  Participation Quality:  Resistant  Affect:  Irritable  Cognitive:  Oriented  Insight:  Poor  Engagement in Therapy:  Limited  Modes of Intervention:  Confrontation, Discussion, Education, Exploration, Problem-solving, Rapport Building, Socialization and Support  Summary of Progress/Problems: Today's Topic: Overcoming Obstacles. Pt identified obstacles faced currently and processed barriers involved in overcoming these obstacles. Pt identified steps necessary for overcoming these obstacles and explored motivation (internal and external) for facing these difficulties head on. Pt further identified one area of concern in their lives and chose a skill of focus pulled from their "toolbox." [name] was attentive during today's processing group but presented with depressed mood/irritable affect. He angrily stated that he did not want to share his personal business in the group setting or participate in group. CSW asked that he stay and observe if he felt comfortable. [name] stayed for entirety of group but continued to be resistant to active participation. At this time, pt is making no progress in the group setting and demonstrates little insight.    Smart, [name] LCSWA  06/12/2014, 12:37 PM

## 2014-06-12 NOTE — Progress Notes (Signed)
Patient ID: [name], male   DOB: 1970-12-26, 44 y.o.   MRN: [id number] [facility] MD Progress Note  06/12/2014 5:29 PM [name]  MRN:  [id number]  Subjective:   Patient reports some improvement although still worries and ruminates about his psychosocial stressors. At this time worries mostly about housing issues. States he cannot return to his prior housing as his relationship with ex girlfriend who lives there is strained and this would likely lead to relapse. He is interested in going to an inpatient setting, and has specifically expressed interest in going to [employer].  At this time denies medication side effects.  Objective:   I have discussed case with treatment team and have met with patient. Patient is calm, pleasant, and participative in milieu. He is going to groups. He is tolerating medications well. He is not currently presenting with any symptoms of alcohol withdrawal. He is interested, as above, in going to a residential setting after discharge. Has met with SW about this and is willing to consider other options such as [facility], technical sales or [employer] type setting, although worries about lack of funds initially. At this time denies cravings. Mood and affect seem improved.     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 MDD (major depressive disorder), recurrent severe, without psychosis [F33.2] 06/09/2014  . 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 Alcohol dependence [F10.20] 06/08/2011  . Polysubstance abuse [F19.10] 05/31/2011   Total Time spent with patient: 25 minutes  Past Medical History:  Past Medical History  Diagnosis Date  . Alcohol abuse   . Anemia   . Severe major depression with psychotic features   . Colitis    History reviewed.  No pertinent past surgical history. Family History:  Family History  Problem Relation Age of Onset  . Hypertension Mother   . Hypertension Father   . Hypertension Other    Social History:  History  Alcohol Use No    Comment: former  last use1 year ago     History  Drug Use No    Comment: former    History   Social History  . Marital Status: Single    Spouse Name: N/A    Number of Children: N/A  . Years of Education: N/A   Social History Main Topics  . Smoking status: Current Every Day Smoker -- 0.25 packs/day    Types: Cigarettes  . Smokeless tobacco: None  . Alcohol Use: No     Comment: former  last use1 year ago  . Drug Use: No     Comment: former  . Sexual Activity: Yes    Birth Control/ Protection: Condom   Other Topics Concern  . None   Social History Narrative   Additional History:    Sleep: fair   Appetite: improved      Musculoskeletal: Strength & Muscle Tone: within normal limits- no tremors, no diaphoresis Gait & Station: normal Patient leans: N/A   Psychiatric Specialty Exam: Physical Exam  Vitals reviewed. Psychiatric: His mood appears anxious. He exhibits a depressed mood.    ROS  Blood pressure 117/81, pulse 72, temperature 98.4 F (36.9 C), temperature source Oral, resp. rate 17, height 5' 8"  (1.727 m), weight 140 lb (63.504 kg).Body mass index is 21.29 kg/(m^2).   General Appearance: improved grooming   Eye  Contact:: good   Speech: Clear and Coherent  Volume: Normal  Mood: improved ,  Today seems euthymic, although affect tends to be anxious   Affect: Anxious but reactive   Thought Process:linear, not disorganized   Orientation: Full (Time, Place, and Person)  Thought Content: Rumination about stressors as above, no hallucinations, no delusions  Suicidal Thoughts: No- at this time denies any plan or intention of hurting self or anyone else   Homicidal Thoughts: No  Memory:recent and remote grossly intact    Judgement: Fair  Insight:present   Psychomotor Activity: Normal  Concentration: Good  Recall: Good  Fund of Knowledge:Good  Language: Good  Akathisia: Negative  Handed: Right  AIMS (if indicated):    Assets: Desire for Improvement Physical Health Resilience  ADL's: Intact  Cognition: WNL  Sleep: Number of Hours: 5   Current Medications: Current Facility-Administered Medications  Medication Dose Route Frequency Provider Last Rate Last Dose  . acetaminophen (TYLENOL) tablet 650 mg  650 mg Oral Q6H PRN Lurena Nida, NP      . alum & mag hydroxide-simeth (MAALOX/MYLANTA) 200-200-20 MG/5ML suspension 30 mL  30 mL Oral Q4H PRN Lurena Nida, NP      . doxycycline (VIBRA-TABS) tablet 100 mg  100 mg Oral Q12H Lurena Nida, NP   100 mg at 06/12/14 0750  . magnesium hydroxide (MILK OF MAGNESIA) suspension 30 mL  30 mL Oral Daily PRN Lurena Nida, NP      . traZODone (DESYREL) tablet 50 mg  50 mg Oral QHS PRN Lurena Nida, NP        Lab Results:  No results found for this or any previous visit (from the past 48 hour(s)).  Physical Findings: AIMS: Facial and Oral Movements Muscles of Facial Expression: None, normal Lips and Perioral Area: None, normal Jaw: None, normal Tongue: None, normal,Extremity Movements Upper (arms, wrists, hands, fingers): None, normal Lower (legs, knees, ankles, toes): None, normal, Trunk Movements Neck, shoulders, hips: None, normal, Overall Severity Severity of abnormal movements (highest score from questions above): None, normal Incapacitation due to abnormal movements: None, normal Patient's awareness of abnormal movements (rate only patient's report): No Awareness, Dental Status Current problems with teeth and/or dentures?: No Does patient usually wear dentures?: No  CIWA:    COWS:      Assessment- patient continues to improve. At this time not presenting with any symptoms of withdrawal. His mood is improved. He is  motivated in Chemical engineer and is focusing on going to an inpatient setting such as a Rehab upon discharge to minimize risk of relapse. As noted, not on any current medications.  Treatment Plan Summary: Daily contact with patient to assess and evaluate symptoms and progress in treatment and Medication management  As above, no current standing psychiatric medication regimen. Treatment team working on disposition planning.    Medical Decision Making:  Established Problem, Stable/Improving (1), Discuss test with performing physician (1) and Review and summation of old records (2)  COBOS, FERNANDO 06/12/2014, 5:29 PM

## 2014-06-13 MED ORDER — TRAZODONE HCL 50 MG PO TABS: 50.0000 mg | ORAL_TABLET | Freq: Every evening | ORAL | Status: DC | PRN

## 2014-06-13 NOTE — [facility] Group Notes (Signed)
[facility] LCSW Group Therapy 06/13/2014  1:15 PM Type of Therapy: Group Therapy Participation Level: Active  Participation Quality: Attentive, Sharing and Supportive  Affect: Depressed and Flat  Cognitive: Alert and Oriented  Insight: Developing/Improving and Engaged  Engagement in Therapy: Developing/Improving and Engaged  Modes of Intervention: Clarification, Confrontation, Discussion, Education, Exploration, Limit-setting, Orientation, Problem-solving, Rapport Building, Dance movement psychotherapisteality Testing, Socialization and Support  Summary of Progress/Problems: The topic for group today was emotional regulation. This group focused on both positive and negative emotion identification and allowed group members to process ways to identify feelings, regulate negative emotions, and find healthy ways to manage internal/external emotions. Group members were asked to reflect on a time when their reaction to an emotion led to a negative outcome and explored how alternative responses using emotion regulation would have benefited them. Group members were also asked to discuss a time when emotion regulation was utilized when a negative emotion was experienced. Patient discussed wanting to "work on my attitude" and expressed that his anger is an issue. CSW and group members discussed effective communication skills and provided patient with emotional support and encouragement.  Samuella BruinKristin Marlenne Ridge, MSW, [employer] Clinical Social Worker [facility] [phone]

## 2014-06-13 NOTE — Progress Notes (Signed)
Patient discharged per physician order; patient denies SI/HI and A/V hallucinations; patient received prescriptions and copy of AVS after it was reviewed; patient refused his samples; patient also received bus passes; patient had no other questions or concerns at this time; patient verbalized and signed that all belongings were returned; patient left the unit ambulatory

## 2014-06-13 NOTE — Progress Notes (Signed)
  [facility] :  Will you be returning to the same living situation after discharge:  No. [facility] shelter At discharge, do you have transportation home?: Yes,  patient will be provided with bus pass Do you have the ability to pay for your medications: Yes,  patient will be provided with medication samples and prescriptions  Release of information consent forms completed and in the chart;  Patient's signature needed at discharge.  Patient to Follow up at: Follow-up Information    [facility]   Specialty:  Professional Counselor   Why:  Please present to walk-in clinic Monday-Friday between 8 am to 12 pm for assessment for therapy and medication management services.   Contact information:   [facility] [address] [place] [address] [phone]       Patient denies SI/HI: Yes,  denies    Safety Planning and Suicide Prevention discussed: Yes,  with patient  Have you used any form of tobacco in the last 30 days? (Cigarettes, Smokeless Tobacco, Cigars, and/or Pipes): Yes  Has patient been referred to the Quitline?: Patient refused referral  Retal Tonkinson, [place] CarboKristin L 06/13/2014, 12:18 PM

## 2014-06-13 NOTE — Clinical Social Work Note (Signed)
CSW and MD met with patient to discuss discharge plans. Patient is requesting to discharge today as he will begin his new job tomorrow. Patient is unsure of where he will stay at discharge. Patient does not want to leave [facility] due to new job. He reports that he may have a place to stay but that all his acquaintances drink or use drugs and that it would risk his sobriety. Patient does not want to stay at [facility] due to not being able to secure his belongings during the day. Per patient report, he has been declined at [facility] due to not money at this time. Patient also reports that he owes [employer] from several years ago and is unsure if he is able to return. Patient inquired about [facility]. Patient was encouraged to follow up with [facility] regarding eligibility to return and to discharge to shelter if unable to return to [facility].   CSW provided patient with listing of [facility] with availability and encouraged patient to call. As patient requested, CSW contacted [facility]- no emergency housing available.  Tilden Fossa, MSW, [facility] Worker [facility] [phone]

## 2014-06-13 NOTE — Discharge Summary (Signed)
Physician Discharge Summary Note  Patient:  [name] is an 44 y.o., male MRN:  [id number] DOB:  Sep 02, 1970 Patient phone:  [phone] (home)  Patient address:   [address] [place] [address],  Total Time spent with patient: 30 minutes  Date of Admission:  06/09/2014 Date of Discharge: 06/13/2014  Reason for Admission:  Substance abuse Principal Problem: MDD (major depressive disorder), recurrent severe, without psychosis Discharge Diagnoses: Patient Active Problem List   Diagnosis Date Noted  . MDD (major depressive disorder), recurrent severe, without psychosis [F33.2] 06/09/2014  . 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 Alcohol dependence [F10.20] 06/08/2011  . Polysubstance abuse [F19.10] 05/31/2011    Musculoskeletal: Strength & Muscle Tone: within normal limits Gait & Station: normal Patient leans: N/A  Psychiatric Specialty Exam: Physical Exam  Vitals reviewed. Psychiatric: He has a normal mood and affect.    Review of Systems  Constitutional: Negative.   HENT: Negative.   Eyes: Negative.   Respiratory: Negative.   Cardiovascular: Negative.   Gastrointestinal: Negative.   Genitourinary: Negative.   Musculoskeletal: Negative.   Skin: Negative.   Neurological: Negative.   Endo/Heme/Allergies: Negative.   Psychiatric/Behavioral: The patient is nervous/anxious.     Blood pressure 116/63, pulse 78, temperature 98.5 F (36.9 C), temperature source Oral, resp. rate 16, height  (1.727 m), weight 63.504 kg (140 lb).Body mass index is 21.29 kg/(m^2).   Past Medical History:  Past Medical History  Diagnosis Date  . Alcohol abuse   . Anemia   . Severe major depression with psychotic features   . Colitis    History reviewed. No pertinent past surgical history. Family History:  Family History  Problem Relation Age of Onset  . Hypertension Mother   . Hypertension Father   . Hypertension Other     Social History:  History  Alcohol Use No    Comment: former  last use1 year ago     History  Drug Use No    Comment: former    History   Social History  . Marital Status: Single    Spouse Name: N/A  . Number of Children: N/A  . Years of Education: N/A   Social History Main Topics  . Smoking status: Current Every Day Smoker -- 0.25 packs/day    Types: Cigarettes  . Smokeless tobacco: Not on file  . Alcohol Use: No     Comment: former  last use1 year ago  . Drug Use: No     Comment: former  . Sexual Activity: Yes    Birth Control/ Protection: Condom   Other Topics Concern  . None   Social History Narrative    Past Psychiatric History: Hospitalizations:  [facility], [facility]  Outpatient Care:  Substance Abuse Care:    Self-Mutilation:  Denies  Suicidal Attempts:  Denies  Violent Behaviors:  Denies   Risk to Self: Is patient at risk for suicide?: Yes What has been your use of drugs/alcohol within the last 12 months?: Pt reports he has been clean from alcohol and THC for 15 months as of today Risk to Others:   Prior Inpatient Therapy:   Prior Outpatient Therapy:    Level of Care:  OP  Hospital Course:  Ziah Leandro is a 44 yo patient that has a history of substance abuse and has been to [facility] in 2014. He reports sobriety for 15 mos and indeed UDS is negative for substances and BAC <5. He has recently discovered that  his fiance was cheating on him and he has decided to leave her. He was living with her. This made him have suicidal thoughts, "I was just thinking hard, I am not hearing voices to hurt me or others." "I wanted to stab myself." Denies VH. Furthermore, other stressors to include a 45 year old daughter who is ill and a father recently who needs a liver transplant.  He denied being depressed and has deferred being started on anti depressants for now. He truly feels that his thoughts were brought on by the barrage of circumstances in his life that came all at  once. He asserts that he has made it though tough times and he has been able to stay clean.   [facility] substance abuse and anxiety issues.  He managed to attend group therapy sessions.  During follow up, patient verbalized having plans to get his life straight.  Although he still worried and ruminated about his psychosocial stressors such as housing, he reported improvement in mood and outlook.  He is not currently presenting with any symptoms of alcohol withdrawal, denied cravings.  At time of discharge, he  rated both depression and anxiety levels to be manageable and minimal.  He was able to identify the triggers of his emotional crises and de-stabilizations.  He identified the positive things in life that would help him deal better with life's stressors.  He did well with the medications prescribed for him.  Denies physiological concerns/SI/HI/AVH at time of discharge.  He has satisfactory support network and home environment and will adhere to medication compliance and outpatient treatment.    Consults:  psychiatry  Significant Diagnostic Studies:  labs: per ED  Discharge Vitals:   Blood pressure 116/63, pulse 78, temperature 98.5 F (36.9 C), temperature source Oral, resp. rate 16, height 5\' 8"  (1.727 m), weight 63.504 kg (140 lb). Body mass index is 21.29 kg/(m^2). Lab Results:   No results found for this or any previous visit (from the past 72 hour(s)).  Physical Findings: AIMS: Facial and Oral Movements Muscles of Facial Expression: None, normal Lips and Perioral Area: None, normal Jaw: None, normal Tongue: None, normal,Extremity Movements Upper (arms, wrists, hands, fingers): None, normal Lower (legs, knees, ankles, toes): None, normal, Trunk Movements Neck, shoulders, hips: None, normal, Overall Severity Severity of abnormal movements (highest score from questions above): None, normal Incapacitation due to abnormal movements: None,  normal Patient's awareness of abnormal movements (rate only patient's report): No Awareness, Dental Status Current problems with teeth and/or dentures?: No Does patient usually wear dentures?: No  CIWA:    COWS:     See Psychiatric Specialty Exam and Suicide Risk Assessment completed by Attending Physician prior to discharge.  Discharge destination:  Home  Is patient on multiple antipsychotic therapies at discharge:  No   Has Patient had three or more failed trials of antipsychotic monotherapy by history:  No  Recommended Plan for Multiple Antipsychotic Therapies: NA     Medication List    STOP taking these medications        cephALEXin 500 MG capsule  Commonly known as:  KEFLEX     cyclobenzaprine 10 MG tablet  Commonly known as:  FLEXERIL     doxycycline 100 MG capsule  Commonly known as:  VIBRAMYCIN     HYDROcodone-acetaminophen 5-325 MG per tablet  Commonly known as:  NORCO/VICODIN     IRON PO     methocarbamol 500 MG tablet  Commonly known  as:  ROBAXIN     naproxen 500 MG tablet  Commonly known as:  NAPROSYN     oxyCODONE-acetaminophen 5-325 MG per tablet  Commonly known as:  PERCOCET/ROXICET     predniSONE 10 MG tablet  Commonly known as:  DELTASONE      TAKE these medications      Indication   traZODone 50 MG tablet  Commonly known as:  DESYREL  Take 1 tablet (50 mg total) by mouth at bedtime as needed for sleep.   Indication:  Trouble Sleeping           Follow-up Information    Follow up with [facility].   Specialty:  Professional Counselor   Why:  Please present to walk-in clinic Monday-Friday between 8 am to 12 pm for assessment for therapy and medication management services.   Contact information:   [facility] [address] [place] [address] [phone]       Follow-up recommendations:  Activity:  as tol, diet as tol  Comments:  1.  Take all your medications as prescribed.               2.  Report any adverse side effects to outpatient provider.                       3.  Patient instructed to not use alcohol or illegal drugs while on prescription medicines.            4.  In the event of worsening symptoms, instructed patient to call 911, the crisis hotline or go to nearest emergency room for evaluation of symptoms.  Total Discharge Time:  30 min  Signed: Adonis Brook MAY, AGNP-BC 06/13/2014, 2:00 PM   Patient seen, Suicide Assessment Completed.  Disposition Plan Reviewed

## 2014-06-13 NOTE — Clinical Social Work Note (Signed)
[facility] provided with [facility] of [facility] as well as local shelters. 2 bus passes provided to patient to assist with transportation.  Samuella BruinKristin Kona Lover, MSW, [employer]CS[employer] Clinical Social Worker [facility] [phone]

## 2014-06-13 NOTE — Clinical Social Work Note (Signed)
Late entry from 06/12/14:  Patient provided contact [facility] to [facility] and encouraged to contact administrator Winferd Humphreyee Gray regarding possible acceptance for Wednesday 2/10. Patient agreeable and thanked CSW for assistance.  Samuella BruinKristin Tae Vonada, MSW, [employer] Clinical Social Worker [facility] [phone]

## 2014-06-13 NOTE — [facility] Suicide Risk Assessment (Signed)
[facility]   Demographic Factors:  44 year old man, separated, currently homeless   Total Time spent with patient: 30 minutes  Musculoskeletal: Strength & Muscle Tone: within normal limits Gait & Station: normal Patient leans: N/A  Psychiatric Specialty Exam: Physical Exam  ROS  Blood pressure 116/63, pulse 78, temperature 98.5 F (36.9 C), temperature source Oral, resp. rate 16, height 5\' 8"  (1.727 m), weight 140 lb (63.504 kg).Body mass index is 21.29 kg/(m^2).  General Appearance: improved grooming  Eye Contact::  Good  Speech:  Normal Rate  Volume:  Normal  Mood:  improved, today euthymic   Affect:  Appropriate  Thought Process:  Goal Directed and Linear  Orientation:  Full (Time, Place, and Person)  Thought Content:  denies hallucinations, no delusions, not internally preoccupied   Suicidal Thoughts:  No at this time denies any thoughts of hurting self and  contracts for safety on unit   Homicidal Thoughts:  No  Memory: Recent and Remote grossly intact  Judgement:  Other:  improved   Insight:  Present  Psychomotor Activity:  Normal  Concentration:  Good  Recall:  Good  Fund of Knowledge:Good  Language: Good  Akathisia:  Negative  Handed:  Right  AIMS (if indicated):     Assets:  Desire for Improvement Resilience  Sleep:  Number of Hours: 6  Cognition: WNL  ADL's: improved    Have you used any form of tobacco in the last 30 days? (Cigarettes, Smokeless Tobacco, Cigars, and/or Pipes): Yes  Has this patient used any form of tobacco in the last 30 days? (Cigarettes, Smokeless Tobacco, Cigars, and/or Pipes) No  Mental Status Per Nursing Assessment::   On Admission:  Suicidal ideation indicated by patient, Suicide plan  Current Mental Status by Physician: At this time patient is fully alert, attentive, well related, calm, not presenting with any symptoms of withdrawal, and with stable vitals. He is euthymic, full range of affect, no  thought disorder, no SI or HI. He is future oriented .   Loss Factors: Limited contact with his adult children. One of her daughters has a history of neurological sequelae of encephalitis or similar. Homelessness , recent break up with girlfriend.  Historical Factors: History of depression. History of Alcohol Dependence, now sober x more than a year.  Risk Reduction Factors:   Sense of responsibility to family, Positive social support and Positive coping skills or problem solving skills- states he gets positive support from his church/pastor   Continued Clinical Symptoms:  At this time improved, mood and affect improved and today seems euthymic, no SI or HI, no psychotic symptoms, future oriented, focused on being discharged so he can complete some classes he needs to complete before starting a job he has been offered.  Cognitive Features That Contribute To Risk:  At this time alert, attentive, fully oriented   Suicide Risk:  Mild:  Suicidal ideation of limited frequency, intensity, duration, and specificity.  There are no identifiable plans, no associated intent, mild dysphoria and related symptoms, good self-control (both objective and subjective assessment), few other risk factors, and identifiable protective factors, including available and accessible social support.  Principal Problem: MDD (major depressive disorder), recurrent severe, without psychosis Discharge Diagnoses:  Patient Active Problem List   Diagnosis Date Noted  . MDD (major depressive disorder), recurrent severe, without psychosis [F33.2] 06/09/2014  . 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 Alcohol dependence [F10.20] 06/08/2011  . Polysubstance abuse [F19.10]  05/31/2011    Follow-up Information    Follow up with [facility].   Specialty:  Professional Counselor   Why:  Please present to walk-in clinic Monday-Friday between 8 am to 12 pm  for assessment for therapy and medication management services.   Contact information:   [facility] [place] [address] [place] [address] [phone]       Plan Of Care/Follow-up recommendations:  Activity:  Regular Diet:  Regular Tests:  NA Other:  See below  Is patient on multiple antipsychotic therapies at discharge:  No   Has Patient had three or more failed trials of antipsychotic monotherapy by history:  No  Recommended Plan for Multiple Antipsychotic Therapies: NA  Patient is leaving unit in good spirits. States he plans to go to an [employer], and if cannot secure a bed there, will go to live with friend for a period of time. Plans to follow up as above.    Maeci Kalbfleisch 06/13/2014, 12:54 PM

## 2014-06-13 NOTE — [facility] Group Notes (Signed)
   [facility]  06/13/2014  8:45 AM   Participation Quality: Alert, Appropriate and Oriented  Mood/Affect: Depressed and Flat  Depression Rating: Patient unable to rate but endorses depression related to discharge plans  Anxiety Rating: Patient unable to rate anxiety but endorses anxiety related to discharge plans  Thoughts of Suicide: Pt denies SI/HI  Will you contract for safety? Yes  Current AVH: Pt denies  Plan for Discharge/Comments: Pt attended discharge planning group and actively participated in group. CSW provided pt with today's workbook. Patient reports feeling "worried" about the uncertainty of his discharge plans. Patient reported that [facility] and is uncertain of where he will stay at discharge. He reports that he has a place to stay but that it is not ideal.    Transportation Means: Pt reports access to transportation  Supports: No supports mentioned at this time  Samuella BruinKristin Tobe Kervin, MSW, [employer]CSWA Clinical Social Worker [facility] [phone]

## 2014-06-13 NOTE — [facility] Suicide Risk Assessment (Signed)
[facility] INPATIENT:  Family/Significant Other Suicide Prevention Education  Suicide Prevention Education:  Patient Refusal for Family/Significant Other Suicide Prevention Education: The patient [name] has refused to provide written consent for family/significant other to be provided Family/Significant Other Suicide Prevention Education during admission and/or prior to discharge.  Physician notified. SPE reviewed with patient and brochure provided. Patient encouraged to return to hospital if having suicidal thoughts, patient verbalized his/her understanding and has no further questions at this time.   [name], West CarboKristin L 06/13/2014, 7:04 AM

## 2014-06-15 NOTE — Progress Notes (Signed)
Patient Discharge Instructions:  After Visit Summary (AVS):   Faxed to:  06/15/14 Discharge Summary Note:   Faxed to:  06/15/14 Psychiatric Admission Assessment Note:   Faxed to:  06/15/14 Suicide Risk Assessment - Discharge Assessment:   Faxed to:  06/15/14 Faxed/Sent to the Next Level Care provider:  06/15/14 [facility] @ [phone]  Jerelene ReddenSheena E Hymera, 06/15/2014, 3:36 PM

## 2014-07-13 ENCOUNTER — Emergency Department (HOSPITAL_COMMUNITY)
Admission: EM | Admit: 2014-07-13 | Discharge: 2014-07-13 | Disposition: A | Discharge status: Home / Self Care | Payer: Self-pay | Attending: Emergency Medicine | Admitting: Emergency Medicine

## 2014-07-13 ENCOUNTER — Encounter (HOSPITAL_COMMUNITY): Payer: Self-pay | Admitting: Cardiology

## 2014-07-13 DIAGNOSIS — R112 Nausea with vomiting, unspecified: Secondary | ICD-10-CM | POA: Insufficient documentation

## 2014-07-13 DIAGNOSIS — Z720 Tobacco use: Secondary | ICD-10-CM | POA: Insufficient documentation

## 2014-07-13 DIAGNOSIS — F1010 Alcohol abuse, uncomplicated: Secondary | ICD-10-CM | POA: Insufficient documentation

## 2014-07-13 DIAGNOSIS — F1910 Other psychoactive substance abuse, uncomplicated: Secondary | ICD-10-CM

## 2014-07-13 LAB — URINALYSIS, ROUTINE W REFLEX MICROSCOPIC
BILIRUBIN URINE: NEGATIVE
GLUCOSE, UA: NEGATIVE mg/dL
Hgb urine dipstick: NEGATIVE
KETONES UR: 15 mg/dL — AB
LEUKOCYTES UA: NEGATIVE
NITRITE: NEGATIVE
PROTEIN: NEGATIVE mg/dL
Specific Gravity, Urine: 1.016 (ref 1.005–1.030)
UROBILINOGEN UA: 0.2 mg/dL (ref 0.0–1.0)
pH: 5.5 (ref 5.0–8.0)

## 2014-07-13 LAB — COMPREHENSIVE METABOLIC PANEL
ALK PHOS: 34 U/L — AB (ref 39–117)
ALT: 9 U/L (ref 0–53)
AST: 16 U/L (ref 0–37)
Albumin: 4.4 g/dL (ref 3.5–5.2)
Anion gap: 9 (ref 5–15)
CALCIUM: 9.8 mg/dL (ref 8.4–10.5)
CO2: 26 mmol/L (ref 19–32)
CREATININE: 0.9 mg/dL (ref 0.50–1.35)
Chloride: 105 mmol/L (ref 96–112)
GFR calc non Af Amer: >90 mL/min (ref >90)
GLUCOSE: 105 mg/dL — AB (ref 70–99)
Potassium: 3.7 mmol/L (ref 3.5–5.1)
Sodium: 140 mmol/L (ref 135–145)
TOTAL PROTEIN: 7.2 g/dL (ref 6.0–8.3)
Total Bilirubin: 0.8 mg/dL (ref 0.3–1.2)

## 2014-07-13 LAB — CBC WITH DIFFERENTIAL/PLATELET
Basophils Absolute: 0 10*3/uL (ref 0.0–0.1)
Basophils Relative: 0 % (ref 0–1)
EOS PCT: 3 % (ref 0–5)
Eosinophils Absolute: 0.2 10*3/uL (ref 0.0–0.7)
HCT: 40.2 % (ref 39.0–52.0)
HEMOGLOBIN: 13 g/dL (ref 13.0–17.0)
LYMPHS ABS: 1.6 10*3/uL (ref 0.7–4.0)
Lymphocytes Relative: 33 % (ref 12–46)
MCH: 30 pg (ref 26.0–34.0)
MCHC: 32.3 g/dL (ref 30.0–36.0)
MCV: 92.6 fL (ref 78.0–100.0)
MONO ABS: 0.4 10*3/uL (ref 0.1–1.0)
Monocytes Relative: 8 % (ref 3–12)
Neutro Abs: 2.7 10*3/uL (ref 1.7–7.7)
Neutrophils Relative %: 56 % (ref 43–77)
Platelets: 245 10*3/uL (ref 150–400)
RBC: 4.34 MIL/uL (ref 4.22–5.81)
RDW: 13.3 % (ref 11.5–15.5)
WBC: 4.9 10*3/uL (ref 4.0–10.5)

## 2014-07-13 LAB — RAPID URINE DRUG SCREEN, HOSP PERFORMED
Amphetamines: NOT DETECTED
Barbiturates: NOT DETECTED
Benzodiazepines: NOT DETECTED
COCAINE: NOT DETECTED
Opiates: NOT DETECTED
Tetrahydrocannabinol: NOT DETECTED

## 2014-07-13 LAB — LIPASE, BLOOD: Lipase: 29 U/L (ref 11–59)

## 2014-07-13 MED ORDER — FENTANYL CITRATE 0.05 MG/ML IJ SOLN
100.0000 ug | Freq: Once | INTRAMUSCULAR | Status: AC
  Administered 2014-07-13: 100 ug via INTRAVENOUS
  Filled 2014-07-13: qty 2

## 2014-07-13 MED ORDER — SODIUM CHLORIDE 0.9 % IV BOLUS (SEPSIS)
1000.0000 mL | Freq: Once | INTRAVENOUS | Status: AC
  Administered 2014-07-13: 1000 mL via INTRAVENOUS

## 2014-07-13 MED ORDER — FENTANYL CITRATE 0.05 MG/ML IJ SOLN
50.0000 ug | Freq: Once | INTRAMUSCULAR | Status: AC
  Administered 2014-07-13: 50 ug via INTRAVENOUS
  Filled 2014-07-13: qty 2

## 2014-07-13 MED ORDER — ONDANSETRON HCL 4 MG/2ML IJ SOLN
4.0000 mg | Freq: Once | INTRAMUSCULAR | Status: AC
  Administered 2014-07-13: 4 mg via INTRAVENOUS
  Filled 2014-07-13: qty 2

## 2014-07-13 MED ORDER — SODIUM CHLORIDE 0.9 % IV BOLUS (SEPSIS)
500.0000 mL | Freq: Once | INTRAVENOUS | Status: AC
  Administered 2014-07-13: 500 mL via INTRAVENOUS

## 2014-07-13 MED ORDER — PROMETHAZINE HCL 25 MG PO TABS
25.0000 mg | ORAL_TABLET | Freq: Four times a day (QID) | ORAL | Status: DC | PRN
Start: 2014-07-13 — End: 2014-08-28

## 2014-07-13 MED ORDER — PROMETHAZINE HCL 25 MG/ML IJ SOLN
25.0000 mg | INTRAMUSCULAR | Status: DC | PRN
  Filled 2014-07-13: qty 1

## 2014-07-13 NOTE — Discharge Instructions (Signed)

## 2014-07-13 NOTE — ED Provider Notes (Signed)
CSN: [id number]     Arrival date & time 07/13/14  1417 History   First MD Initiated Contact with Patient 07/13/14 1705     Chief Complaint  Patient presents with  . Emesis      HPI  Expand All Collapse All   Pt reports n/v for the past couple of days. States he has also had generalized weakness. No sick contacts. Patient has chronic pain issues but says he's not had any pain medicine for the last 2 months.  Denies any recent diarrhea or fever.  Denies any hematemesis or hematochezia.          Past Medical History  Diagnosis Date  . Alcohol abuse   . Anemia   . Severe major depression with psychotic features   . Colitis    History reviewed. No pertinent past surgical history. Family History  Problem Relation Age of Onset  . Hypertension Mother   . Hypertension Father   . Hypertension Other    History  Substance Use Topics  . Smoking status: Current Every Day Smoker -- 0.25 packs/day    Types: Cigarettes  . Smokeless tobacco: Not on file  . Alcohol Use: No     Comment: former  last use1 year ago    Review of Systems  All other systems reviewed and are negative  Allergies  Aspirin; Ibuprofen; Pork-derived products; and Sertraline  Home Medications   Prior to Admission medications   Medication Sig Start Date End Date Taking? Authorizing Provider  promethazine (PHENERGAN) 25 MG tablet Take 1 tablet (25 mg total) by mouth every 6 (six) hours as needed for nausea or vomiting. 07/13/14   Nelva Nay, MD  traZODone (DESYREL) 50 MG tablet Take 1 tablet (50 mg total) by mouth at bedtime as needed for sleep. 06/13/14   Adonis Brook, NP   BP 135/84 mmHg  Pulse 66  Temp(Src) 98.7 F (37.1 C) (Oral)  Resp 16  SpO2 99% Physical Exam Physical Exam  Nursing note and vitals reviewed. Constitutional: He is oriented to person, place, and time. He appears well-developed and well-nourished. No distress.  HENT:  Head: Normocephalic and atraumatic.  Eyes: Pupils are equal,  round, and reactive to light.  Neck: Normal range of motion.  Cardiovascular: Normal rate and intact distal pulses.   Pulmonary/Chest: No respiratory distress.  Abdominal: Normal appearance. He exhibits no distension.  Musculoskeletal: Normal range of motion.  Neurological: He is alert and oriented to person, place, and time. No cranial nerve deficit.  Skin: Skin is warm and dry. No rash noted.  Psychiatric: He has a normal mood and affect. His behavior is normal.   ED Course  Procedures (including critical care time) Medications  promethazine (PHENERGAN) injection 25 mg (not administered)  fentaNYL (SUBLIMAZE) injection 100 mcg (100 mcg Intravenous Given 07/13/14 1737)  ondansetron (ZOFRAN) injection 4 mg (4 mg Intravenous Given 07/13/14 1737)  sodium chloride 0.9 % bolus 1,000 mL (0 mLs Intravenous Stopped 07/13/14 1840)  sodium chloride 0.9 % bolus 1,000 mL (1,000 mLs Intravenous New Bag/Given 07/13/14 1841)  sodium chloride 0.9 % bolus 500 mL (500 mLs Intravenous New Bag/Given 07/13/14 1841)  fentaNYL (SUBLIMAZE) injection 50 mcg (50 mcg Intravenous Given 07/13/14 1920)    Labs Review Labs Reviewed  COMPREHENSIVE METABOLIC PANEL - Abnormal; Notable for the following:    Glucose, Bld 105 (*)    BUN <5 (*)    Alkaline Phosphatase 34 (*)    All other components within normal limits  URINALYSIS, ROUTINE  W REFLEX MICROSCOPIC - Abnormal; Notable for the following:    Ketones, ur 15 (*)    All other components within normal limits  CBC WITH DIFFERENTIAL/PLATELET  LIPASE, BLOOD  URINE RAPID DRUG SCREEN (HOSP PERFORMED)    Imaging Review No results found.  After treatment in the ED the patient feels back to baseline and wants to go home.  MDM   Final diagnoses:  Non-intractable vomiting with nausea, vomiting of unspecified type  Polysubstance abuse        Nelva Nayobert Veva Grimley, MD 07/13/14 2012

## 2014-07-13 NOTE — ED Notes (Signed)
Pt reports n/v for the past couple of days. States he has also had generalized weakness. No sick contacts.

## 2014-08-26 ENCOUNTER — Inpatient Hospital Stay (HOSPITAL_COMMUNITY)
Admission: EM | Admit: 2014-08-26 | Discharge: 2014-08-28 | DRG: 603 | Disposition: A | Discharge status: Home / Self Care | Payer: Self-pay | Attending: Family Medicine | Admitting: Family Medicine

## 2014-08-26 ENCOUNTER — Encounter (HOSPITAL_COMMUNITY): Payer: Self-pay | Admitting: Nurse Practitioner

## 2014-08-26 ENCOUNTER — Other Ambulatory Visit (HOSPITAL_COMMUNITY): Payer: Self-pay

## 2014-08-26 ENCOUNTER — Emergency Department (HOSPITAL_COMMUNITY): Payer: Self-pay

## 2014-08-26 DIAGNOSIS — F1910 Other psychoactive substance abuse, uncomplicated: Secondary | ICD-10-CM

## 2014-08-26 DIAGNOSIS — J340 Abscess, furuncle and carbuncle of nose: Secondary | ICD-10-CM

## 2014-08-26 DIAGNOSIS — M549 Dorsalgia, unspecified: Secondary | ICD-10-CM

## 2014-08-26 DIAGNOSIS — L03211 Cellulitis of face: Principal | ICD-10-CM | POA: Diagnosis present

## 2014-08-26 DIAGNOSIS — F329 Major depressive disorder, single episode, unspecified: Secondary | ICD-10-CM | POA: Diagnosis present

## 2014-08-26 DIAGNOSIS — F32A Depression, unspecified: Secondary | ICD-10-CM | POA: Insufficient documentation

## 2014-08-26 DIAGNOSIS — Z79899 Other long term (current) drug therapy: Secondary | ICD-10-CM

## 2014-08-26 DIAGNOSIS — I10 Essential (primary) hypertension: Secondary | ICD-10-CM | POA: Diagnosis present

## 2014-08-26 DIAGNOSIS — R0781 Pleurodynia: Secondary | ICD-10-CM

## 2014-08-26 DIAGNOSIS — G8929 Other chronic pain: Secondary | ICD-10-CM

## 2014-08-26 DIAGNOSIS — L0390 Cellulitis, unspecified: Secondary | ICD-10-CM | POA: Diagnosis present

## 2014-08-26 DIAGNOSIS — F17210 Nicotine dependence, cigarettes, uncomplicated: Secondary | ICD-10-CM | POA: Diagnosis present

## 2014-08-26 LAB — CBC WITH DIFFERENTIAL/PLATELET
BASOS PCT: 0 % (ref 0–1)
Basophils Absolute: 0 10*3/uL (ref 0.0–0.1)
EOS PCT: 1 % (ref 0–5)
Eosinophils Absolute: 0.2 10*3/uL (ref 0.0–0.7)
HEMATOCRIT: 39.3 % (ref 39.0–52.0)
HEMOGLOBIN: 12.9 g/dL — AB (ref 13.0–17.0)
LYMPHS ABS: 2.3 10*3/uL (ref 0.7–4.0)
Lymphocytes Relative: 19 % (ref 12–46)
MCH: 30 pg (ref 26.0–34.0)
MCHC: 32.8 g/dL (ref 30.0–36.0)
MCV: 91.4 fL (ref 78.0–100.0)
MONO ABS: 1.1 10*3/uL — AB (ref 0.1–1.0)
MONOS PCT: 9 % (ref 3–12)
NEUTROS PCT: 71 % (ref 43–77)
Neutro Abs: 8.4 10*3/uL — ABNORMAL HIGH (ref 1.7–7.7)
PLATELETS: 261 10*3/uL (ref 150–400)
RBC: 4.3 MIL/uL (ref 4.22–5.81)
RDW: 13.1 % (ref 11.5–15.5)
WBC: 11.9 10*3/uL — ABNORMAL HIGH (ref 4.0–10.5)

## 2014-08-26 LAB — I-STAT CHEM 8, ED
BUN: 7 mg/dL (ref 6–23)
CREATININE: 0.9 mg/dL (ref 0.50–1.35)
Calcium, Ion: 1.25 mmol/L — ABNORMAL HIGH (ref 1.12–1.23)
Chloride: 102 mmol/L (ref 96–112)
GLUCOSE: 92 mg/dL (ref 70–99)
HCT: 41 % (ref 39.0–52.0)
HEMOGLOBIN: 13.9 g/dL (ref 13.0–17.0)
Potassium: 4.1 mmol/L (ref 3.5–5.1)
SODIUM: 143 mmol/L (ref 135–145)
TCO2: 30 mmol/L (ref 0–100)

## 2014-08-26 LAB — LACTIC ACID, PLASMA: LACTIC ACID, VENOUS: 0.8 mmol/L (ref 0.5–2.0)

## 2014-08-26 MED ORDER — ACETAMINOPHEN 325 MG PO TABS
650.0000 mg | ORAL_TABLET | Freq: Four times a day (QID) | ORAL | Status: DC | PRN
  Administered 2014-08-26: 650 mg via ORAL
  Filled 2014-08-26: qty 2

## 2014-08-26 MED ORDER — CLINDAMYCIN PHOSPHATE 600 MG/50ML IV SOLN
600.0000 mg | Freq: Once | INTRAVENOUS | Status: AC
  Administered 2014-08-26: 600 mg via INTRAVENOUS
  Filled 2014-08-26: qty 50

## 2014-08-26 MED ORDER — OXYCODONE-ACETAMINOPHEN 5-325 MG PO TABS
1.0000 | ORAL_TABLET | Freq: Once | ORAL | Status: AC
  Administered 2014-08-26: 1 via ORAL
  Filled 2014-08-26: qty 1

## 2014-08-26 MED ORDER — CLINDAMYCIN PHOSPHATE 600 MG/50ML IV SOLN
600.0000 mg | Freq: Three times a day (TID) | INTRAVENOUS | Status: AC
  Administered 2014-08-26 – 2014-08-27 (×3): 600 mg via INTRAVENOUS
  Filled 2014-08-26 (×4): qty 50

## 2014-08-26 MED ORDER — ACETAMINOPHEN 650 MG RE SUPP: 650.0000 mg | Freq: Four times a day (QID) | RECTAL | Status: DC | PRN

## 2014-08-26 MED ORDER — TRAMADOL HCL 50 MG PO TABS
50.0000 mg | ORAL_TABLET | Freq: Four times a day (QID) | ORAL | Status: DC | PRN
  Administered 2014-08-26 – 2014-08-28 (×4): 50 mg via ORAL
  Filled 2014-08-26 (×4): qty 1

## 2014-08-26 NOTE — ED Notes (Addendum)
Pt c/o nasal congestion and swelling and body aches x 1 week.

## 2014-08-26 NOTE — ED Provider Notes (Signed)
CSN: [id number]     Arrival date & time 08/26/14  1201 History   First MD Initiated Contact with Patient 08/26/14 1221     Chief Complaint  Patient presents with  . Nasal Congestion  . Dizziness  . Back Pain     (Consider location/radiation/quality/duration/timing/severity/associated sxs/prior Treatment) HPI [name] is a 44 y.o. male history of anemia, depression, alcohol abuse, presents to emergency department complaining of nasal congestion and pain, back pain, rib pain. Patient states his main complaint today is swelling and tenderness to the tip of his nose. He states swelling was mild and started several days ago and progressively has gotten worse. He states his nose is swollen, red, tender to the touch. He denies any injuries to the nose. He denies any sores or lesions. He states prior to this he did have some nasal congestion and improve his nose several times. He denies any intranasal drugs. He denies any injuries. He denies history of the same. Patient is also complaining of right ribs pain and tenderness. He denies any injuries. States painful to breathe and cough. Denies any fever or chills. States also having chronic back pain from an accident multiple years ago. No pain radiation down extremities. No numbness or weakness in extremities. No new injuries. No medications prior to coming in.  Past Medical History  Diagnosis Date  . Alcohol abuse   . Anemia   . Severe major depression with psychotic features   . Colitis    History reviewed. No pertinent past surgical history. Family History  Problem Relation Age of Onset  . Hypertension Mother   . Hypertension Father   . Hypertension Other    History  Substance Use Topics  . Smoking status: Current Every Day Smoker -- 0.25 packs/day    Types: Cigarettes  . Smokeless tobacco: Not on file  . Alcohol Use: No     Comment: former  last use1 year ago    Review of Systems  Constitutional: Negative for fever and  chills.  HENT: Positive for congestion, facial swelling and rhinorrhea. Negative for nosebleeds.   Respiratory: Negative for cough, chest tightness and shortness of breath.   Cardiovascular: Positive for chest pain. Negative for palpitations and leg swelling.  Gastrointestinal: Negative for nausea, vomiting, abdominal pain, diarrhea and abdominal distention.  Genitourinary: Negative for dysuria, urgency, frequency and hematuria.  Musculoskeletal: Positive for myalgias and back pain. Negative for neck pain and neck stiffness.  Skin: Negative for rash.  Allergic/Immunologic: Negative for immunocompromised state.  Neurological: Positive for headaches. Negative for dizziness, weakness, light-headedness and numbness.  All other systems reviewed and are negative.     Allergies  Aspirin; Ibuprofen; Pork-derived products; and Sertraline  Home Medications   Prior to Admission medications   Medication Sig Start Date End Date Taking? Authorizing Provider  promethazine (PHENERGAN) 25 MG tablet Take 1 tablet (25 mg total) by mouth every 6 (six) hours as needed for nausea or vomiting. 07/13/14   Nelva Nay, MD  traZODone (DESYREL) 50 MG tablet Take 1 tablet (50 mg total) by mouth at bedtime as needed for sleep. 06/13/14   Adonis Brook, NP   BP 143/89 mmHg  Pulse 69  Temp(Src) 98 F (36.7 C)  Resp 16  Ht  (1.702 m)  Wt 153 lb (69.4 kg)  BMI 23.96 kg/m2  SpO2 99% Physical Exam  Constitutional: He is oriented to person, place, and time. He appears well-developed and well-nourished. No distress.  HENT:  Head: Normocephalic and atraumatic.  Mouth/Throat: Oropharynx is clear and moist.  Erythema and swelling noted to the distal nose and distal nasal septum. Nose is diffusely tender. No wounds or lesions seen bilateral nares. Mild gingivitis to the gum, no tenderness to the upper gums otherwise. Mild frontal sinus tenderness.   Eyes: Conjunctivae are normal.  Neck: Normal range of motion.  Neck supple.  Cardiovascular: Normal rate, regular rhythm and normal heart sounds.   Pulmonary/Chest: Effort normal. No respiratory distress. He has no wheezes. He has no rales. He exhibits tenderness.  Right lower rib tenderness  Abdominal: Soft. Bowel sounds are normal. He exhibits no distension. There is no tenderness. There is no rebound and no guarding.  Musculoskeletal: He exhibits no edema.  Diffuse bilateral perivertebral lower back tenderness with no swelling, bruising, erythema, rashes.   Neurological: He is alert and oriented to person, place, and time.  Skin: Skin is warm and dry.  Nursing note and vitals reviewed.   ED Course  Procedures (including critical care time) Labs Review Labs Reviewed  CBC WITH DIFFERENTIAL/PLATELET - Abnormal; Notable for the following:    WBC 11.9 (*)    Hemoglobin 12.9 (*)    Neutro Abs 8.4 (*)    Monocytes Absolute 1.1 (*)    All other components within normal limits  I-STAT CHEM 8, ED - Abnormal; Notable for the following:    Calcium, Ion 1.25 (*)    All other components within normal limits  CULTURE, BLOOD (ROUTINE X 2)  CULTURE, BLOOD (ROUTINE X 2)    Imaging Review Dg Chest 2 View  08/26/2014   CLINICAL DATA:  Nasal congestion and dizziness.  Back pain  EXAM: CHEST  2 VIEW  COMPARISON:  04/07/2014  FINDINGS: The heart size and mediastinal contours are within normal limits. Both lungs are clear. The visualized skeletal structures are unremarkable.  IMPRESSION: No active cardiopulmonary disease.   Electronically Signed   By: Marlan Palauharles  Clark M.D.   On: 08/26/2014 14:53     EKG Interpretation None      MDM   Final diagnoses:  Cellulitis of nose  Chronic back pain  Rib pain on right side    Patient with cellulitis, possible abscess to the tip of the nose. Cultures obtained. Covered with clindamycin. Most likely MRSA. Discussed with Dr. Jearld FentonByers with ENT, recommended admission for IV antibiotics. Asked about imaging, at this time he  advised that it is up to us, however even if there is an abscess the treatment is IV antibiotics and see if patient responds to them. Patient is afebrile, nontoxic appearing. He does have right-sided chest tenderness as well, most likely musculoskeletal, chest x-ray is negative. Also complaining of chronic back pain. Percocet given for his pain.  Discussed with family practice they will admit patient.  Filed Vitals:   08/26/14 1206 08/26/14 1232 08/26/14 1426 08/26/14 1450  BP: 152/84 143/89 140/88 138/82  Pulse: 78 69 79 75  Temp: 98 F (36.7 C)  98.5 F (36.9 C)   TempSrc:   Oral   Resp: 16 16 14 16   Height: 5\' 7"  (1.702 m)     Weight: 153 lb (69.4 kg)     SpO2: 100% 99% 99% 98%       Jaynie Crumbleatyana Toran Murch, PA-C 08/26/14 1548  Arby BarretteMarcy Pfeiffer, MD 09/01/14 [id number]
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
## 2014-08-26 NOTE — ED Notes (Signed)
Patient transported to X-ray 

## 2014-08-26 NOTE — Progress Notes (Signed)
Patient requesting pain medicine. Tylenol PO given with no relief. Notified on call. New orders received for Tramaol 50mg  PO.

## 2014-08-26 NOTE — Progress Notes (Signed)
Called ED for report, spoke with Huntley DecSara.

## 2014-08-26 NOTE — ED Notes (Signed)
Pt reports dizziness x 3 days and mid right back pain.

## 2014-08-27 ENCOUNTER — Encounter (HOSPITAL_COMMUNITY): Payer: Self-pay | Admitting: Radiology

## 2014-08-27 ENCOUNTER — Inpatient Hospital Stay (HOSPITAL_COMMUNITY): Payer: Self-pay

## 2014-08-27 DIAGNOSIS — F32A Depression, unspecified: Secondary | ICD-10-CM | POA: Insufficient documentation

## 2014-08-27 DIAGNOSIS — F329 Major depressive disorder, single episode, unspecified: Secondary | ICD-10-CM | POA: Insufficient documentation

## 2014-08-27 DIAGNOSIS — G8929 Other chronic pain: Secondary | ICD-10-CM

## 2014-08-27 DIAGNOSIS — M549 Dorsalgia, unspecified: Secondary | ICD-10-CM

## 2014-08-27 DIAGNOSIS — J340 Abscess, furuncle and carbuncle of nose: Secondary | ICD-10-CM | POA: Insufficient documentation

## 2014-08-27 DIAGNOSIS — F1910 Other psychoactive substance abuse, uncomplicated: Secondary | ICD-10-CM | POA: Insufficient documentation

## 2014-08-27 LAB — CBC
HCT: 38.5 % — ABNORMAL LOW (ref 39.0–52.0)
Hemoglobin: 12.2 g/dL — ABNORMAL LOW (ref 13.0–17.0)
MCH: 29.4 pg (ref 26.0–34.0)
MCHC: 31.7 g/dL (ref 30.0–36.0)
MCV: 92.8 fL (ref 78.0–100.0)
Platelets: 290 10*3/uL (ref 150–400)
RBC: 4.15 MIL/uL — ABNORMAL LOW (ref 4.22–5.81)
RDW: 13 % (ref 11.5–15.5)
WBC: 13.2 10*3/uL — ABNORMAL HIGH (ref 4.0–10.5)

## 2014-08-27 LAB — BASIC METABOLIC PANEL
Anion gap: 9 (ref 5–15)
BUN: 6 mg/dL (ref 6–23)
CHLORIDE: 100 mmol/L (ref 96–112)
CO2: 29 mmol/L (ref 19–32)
Calcium: 8.9 mg/dL (ref 8.4–10.5)
Creatinine, Ser: 1.05 mg/dL (ref 0.50–1.35)
GFR, EST NON AFRICAN AMERICAN: 85 mL/min — AB (ref >90)
Glucose, Bld: 109 mg/dL — ABNORMAL HIGH (ref 70–99)
Potassium: 3.6 mmol/L (ref 3.5–5.1)
SODIUM: 138 mmol/L (ref 135–145)

## 2014-08-27 MED ORDER — AMLODIPINE BESYLATE 5 MG PO TABS
5.0000 mg | ORAL_TABLET | Freq: Every day | ORAL | Status: DC
  Administered 2014-08-27 – 2014-08-28 (×2): 5 mg via ORAL
  Filled 2014-08-27 (×2): qty 1

## 2014-08-27 MED ORDER — WHITE PETROLATUM GEL
Status: AC
  Administered 2014-08-27: 0.2
  Filled 2014-08-27: qty 1

## 2014-08-27 MED ORDER — CLINDAMYCIN HCL 300 MG PO CAPS
300.0000 mg | ORAL_CAPSULE | Freq: Three times a day (TID) | ORAL | Status: DC
  Administered 2014-08-27 – 2014-08-28 (×2): 300 mg via ORAL
  Filled 2014-08-27 (×5): qty 1

## 2014-08-27 MED ORDER — IOHEXOL 300 MG/ML  SOLN
80.0000 mL | Freq: Once | INTRAMUSCULAR | Status: AC | PRN
  Administered 2014-08-27: 80 mL via INTRAVENOUS

## 2014-08-27 NOTE — Progress Notes (Signed)
Family Medicine Teaching Service Daily Progress Note Intern Pager: [phone]  Patient name: [name] Medical record number: [id number] Date of birth: 01-28-1971 Age: 44 y.o. Gender: male  Primary Care Provider: No PCP Per Patient Consultants: Home Code Status: Full  Assessment and Plan: [name] is a 44 y.o. male presenting with cellulitis of the nose. PMH is significant for depression.  # Cellulitis, located on nose: ENT contacted in ED and recommended admission due to concerns for spreading. May discharge if improvement noted on antibiotics. If no improvement, obtain imaging and officially consult ENT. High likelihood of MRSA due to location.  - WBC 11.9 > 13.2 - Lactic Acid 0.8 - Follow up Blood Cultures--no growth to date - Maxillofacial CT without signs of abscess - IV Clindamycin (4/24>>)--Plan to transition to Bactrim tonight  - Transition to oral antibiotics once improvement noted - Tramadol  q6hr PRN - Consider ENT consult if not improvement - Social work consult for Primary Care  # Hypertension: BP 133-156 / 82-94 in ED. Not currently on medications - BP 134/87 this am - Continue to monitor - Initiate Amlodipine  today  # Tobacco abuse: Smokes 5-6 cigarettes per day. Refuses nicotine patch - Continue to counsel on smoking cessation  # Polysubstance Abuse/Depression: Patient denies. Currently not on any medications. Chart review shows psychiatric admission on 06/09/14 for suicidal ideation after discovering his fiance was cheating on him. UDS negative. Discharged on 2/10 following improvement. Last positive UDS in 02/2013 [facility])  FEN/GI: Regular Diet Prophylaxis: SCDs  Disposition: Admit to [facility] Medicine Teaching Service, McDiarmid attending  Subjective:  Feeling well today. Notes some improvement in pain, denying pain at this time.  Objective: Temp:  [97.3 F (36.3 C)-99.8 F (37.7 C)] 98.6 F (37 C) (04/25 0820) Pulse Rate:  [67-86] 80  (04/25 0820) Resp:  [14-18] 17 (04/25 0820) BP: (129-156)/(75-94) 134/87 mmHg (04/25 0820) SpO2:  [97 %-100 %] 100 % (04/25 0820) Weight:  [153 lb (69.4 kg)] 153 lb (69.4 kg) (04/24 1206) Physical Exam: General: 44yo male resting comfortably in no apparent distress HEENT: small improvement in edema and erythema of nose, improvement in tenderness of nose, swollen nasal turbinates without signs of drainage Cardiovascular: S1 and S2 noted, no murmurs/rubs/gallops, regular rate and rhythm Respiratory: Clear to auscultation bilaterally, no wheezes noted, no increased work of breathing Abdomen: Soft and nondistended, bowel sounds noted, no masses or tenderness to palpation Extremities: No edema noted  Laboratory:  Recent Labs Lab 08/26/14 1311 08/26/14 1348 08/27/14 0554  WBC 11.9*  --  13.2*  HGB 12.9* 13.9 12.2*  HCT 39.3 41.0 38.5*  PLT 261  --  290    Recent Labs Lab 08/26/14 1348 08/27/14 0554  NA 143 138  K 4.1 3.6  CL 102 100  CO2  --  29  BUN 7 6  CREATININE 0.90 1.05  CALCIUM  --  8.9  GLUCOSE 92 109*  - Lactic Acid 0.8  Imaging/Diagnostic Tests: Dg Chest 2 View  08/26/2014   CLINICAL DATA:  Nasal congestion and dizziness.  Back pain  EXAM: CHEST  2 VIEW  COMPARISON:  04/07/2014  FINDINGS: The heart size and mediastinal contours are within normal limits. Both lungs are clear. The visualized skeletal structures are unremarkable.  IMPRESSION: No active cardiopulmonary disease.   Electronically Signed   By: Marlan Palau M.D.   On: 08/26/2014 14:53   Ct Maxillofacial W/cm  08/27/2014   CLINICAL DATA:  Swelling at the tip of the nose. Cellulitis. No  other reported history.  EXAM: CT MAXILLOFACIAL WITH CONTRAST  TECHNIQUE: Multidetector CT imaging of the maxillofacial structures was performed with intravenous contrast. Multiplanar CT image reconstructions were also generated. A small metallic BB was placed on the right temple in order to reliably differentiate right from  left.  CONTRAST:  80mL OMNIPAQUE IOHEXOL 300 MG/ML  SOLN  COMPARISON:  None.  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 LEFT-to-RIGHT deviation.  Gentle bowing of the nasal septum RIGHT to LEFT. This measures 2-3 mm. BILATERAL middle turbinate concha bullosa. BILATERAL inferior turbinate mucosal thickening and hypertrophy. Discontinuity of the RIGHT orbital floor, possible old blowout injury.  Periodontal disease. Significant periapical lucencies surround a LEFT maxillary molar. No evidence for odontogenic sinusitis. Significant dental caries RIGHT maxillary and mandibular molars also with slight periapical lucency.  Paranasal sinuses are clear. No sinus or mastoid air fluid levels. Negative nasopharynx.  Mild cellulitic change of the soft tissues surrounding the knee air E ease. No abscess. No nasal cavity masses. No orbital inflammation.  IMPRESSION: Chronic changes as described. Cellulitic change of the soft tissues of the nose without visible abscess.   Electronically Signed   By: Davonna BellingJohn  Curnes M.D.   On: 08/27/2014 12:18   Araceli Bouchealeigh N Madissen Wyse, DO 08/27/2014, 8:39 AM PGY-1, [place] FPTS Intern pager: [phone], text pages welcome

## 2014-08-27 NOTE — Progress Notes (Signed)
Chaplain received consult for Mr. Millan.   Pt did not remember mentioning anything concerning AD; however wanted to talk, "I never turn down a chance to talk about the word"  Pt presented as contemplative and conversational. Pt is from [place] and has been here for 18 years. His mother has been dead for 26 years and his father is still alive in [place]. He has no family here but has friends in the area.   He is looking forward to returning home after his treatment and has positive support from his friends.   Gala RomneyBrown, Sumaya Riedesel J, Chaplain 08/27/2014

## 2014-08-28 LAB — CBC
HCT: 38.3 % — ABNORMAL LOW (ref 39.0–52.0)
Hemoglobin: 12.3 g/dL — ABNORMAL LOW (ref 13.0–17.0)
MCH: 29.1 pg (ref 26.0–34.0)
MCHC: 32.1 g/dL (ref 30.0–36.0)
MCV: 90.8 fL (ref 78.0–100.0)
Platelets: 283 10*3/uL (ref 150–400)
RBC: 4.22 MIL/uL (ref 4.22–5.81)
RDW: 12.7 % (ref 11.5–15.5)
WBC: 12.2 10*3/uL — ABNORMAL HIGH (ref 4.0–10.5)

## 2014-08-28 LAB — BASIC METABOLIC PANEL
Anion gap: 9 (ref 5–15)
BUN: 6 mg/dL (ref 6–23)
CO2: 28 mmol/L (ref 19–32)
Calcium: 9.2 mg/dL (ref 8.4–10.5)
Chloride: 103 mmol/L (ref 96–112)
Creatinine, Ser: 0.98 mg/dL (ref 0.50–1.35)
GFR calc Af Amer: >90 mL/min (ref >90)
Glucose, Bld: 116 mg/dL — ABNORMAL HIGH (ref 70–99)
Potassium: 3.5 mmol/L (ref 3.5–5.1)
Sodium: 140 mmol/L (ref 135–145)

## 2014-08-28 LAB — HEMOGLOBIN A1C
HEMOGLOBIN A1C: 5.2 % (ref 4.8–5.6)
Mean Plasma Glucose: 103 mg/dL

## 2014-08-28 LAB — HIV ANTIBODY (ROUTINE TESTING W REFLEX): HIV Screen 4th Generation wRfx: NONREACTIVE

## 2014-08-28 MED ORDER — AMLODIPINE BESYLATE 5 MG PO TABS: 5.0000 mg | ORAL_TABLET | Freq: Every day | ORAL | Status: DC

## 2014-08-28 MED ORDER — TRAMADOL HCL 50 MG PO TABS: 50.0000 mg | ORAL_TABLET | Freq: Four times a day (QID) | ORAL | Status: DC | PRN

## 2014-08-28 MED ORDER — CLINDAMYCIN HCL 300 MG PO CAPS: 300.0000 mg | ORAL_CAPSULE | Freq: Three times a day (TID) | ORAL | Status: AC

## 2014-08-28 NOTE — Discharge Summary (Signed)
Family Medicine Teaching [facility] Discharge Summary  Patient name: [name] Medical record number: [id number] Date of birth: [birth year] Age: 44 y.o. Gender: male Date of Admission: 08/26/2014  Date of Discharge: 08/28/14 Admitting Physician: Leighton Roach McDiarmid, MD  Primary Care Provider: No PCP Per Patient Consultants: None  Indication for Hospitalization: Facial Cellulitis  Discharge Diagnoses/Problem List:  Cellulitis Hypertension Depression History of Polysubstance Abuse Tobacco Abuse  Disposition: Discharge Home  Discharge Condition: Stable  Discharge Exam: Please see Progress Note from 08/28/14  Brief Hospital Course:  [name] is a 44yo male who presented to the Emergency Department on 08/26/14 with cellulitis of his nose. ED contacted ENT who recommended admission with IV antibiotics due to high risk location. WBC of 11.9 noted. Clindamycin IV initiated in ED. Admitted to [facility] Medicine Teaching Service due to cellulitis.  IV Clindamycin continued at admission. Very small improvement noted on morning of 4/25 so maxillofacial CT obtained and showed no signs of abscess. Improvement noted throughout day on 4/25, so IV antibiotics transitioned to oral Clindamycin that night. Significant improvement noted on 4/26. Tramadol  q6hr PRN used during hospitalization for pain control.  Mr. Georgiou was also noted to have elevated blood pressures to 150s/90s. Amlodipine  initiated on 4/25 with improvement of blood pressures.  Discharged on 4/26 following significant improvement on oral antibiotics. Blood cultures with no growth to date at discharge, but final pending at discharge.  Issues for Follow Up:  1. Follow up Cellulitis for continued improvement. Discharged on ten day course of Clindamycin. Work note given to return to work 09/03/14 due to boss being nervous about cellulitis in the work place. 2. Follow up Blood Pressures. Discharged on Amlodipine   3. History of depression. Not on any medications at admission or throughout hospitalization.   Significant Procedures: None  Significant Labs and Imaging:   Recent Labs Lab 08/26/14 1311 08/26/14 1348 08/27/14 0554 08/28/14 0705  WBC 11.9*  --  13.2* 12.2*  HGB 12.9* 13.9 12.2* 12.3*  HCT 39.3 41.0 38.5* 38.3*  PLT 261  --  290 283    Recent Labs Lab 08/26/14 1348 08/27/14 0554 08/28/14 0705  NA 143 138 140  K 4.1 3.6 3.5  CL 102 100 103  CO2  --  29 28  GLUCOSE 92 109* 116*  BUN CREATININE 0.90 1.05 0.98  CALCIUM  --  8.9 9.2  - Lactic Acid 0.8 - A1C 5.2 - HIV Non-reactive  Dg Chest 2 View  08/26/2014   CLINICAL DATA:  Nasal congestion and dizziness.  Back pain  EXAM: CHEST  2 VIEW  COMPARISON:  04/07/2014  FINDINGS: The heart size and mediastinal contours are within normal limits. Both lungs are clear. The visualized skeletal structures are unremarkable.  IMPRESSION: No active cardiopulmonary disease.   Electronically Signed   By: Marlan Palau M.D.   On: 08/26/2014 14:53   Ct Maxillofacial W/cm  08/27/2014   CLINICAL DATA:  Swelling at the tip of the nose. Cellulitis. No other reported history.  EXAM: CT MAXILLOFACIAL WITH CONTRAST  TECHNIQUE: Multidetector CT imaging of the maxillofacial structures was performed with intravenous contrast. Multiplanar CT image reconstructions were also generated. A small metallic BB was placed on the right temple in order to reliably differentiate right from left.  CONTRAST:  80mL OMNIPAQUE IOHEXOL 300 MG/ML  SOLN  COMPARISON:  None.  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 LEFT-to-RIGHT deviation.  Gentle bowing of the nasal septum RIGHT to  LEFT. This measures 2-3 mm. BILATERAL middle turbinate concha bullosa. BILATERAL inferior turbinate mucosal thickening and hypertrophy. Discontinuity of the RIGHT orbital floor, possible old blowout injury.  Periodontal disease. Significant periapical lucencies  surround a LEFT maxillary molar. No evidence for odontogenic sinusitis. Significant dental caries RIGHT maxillary and mandibular molars also with slight periapical lucency.  Paranasal sinuses are clear. No sinus or mastoid air fluid levels. Negative nasopharynx.  Mild cellulitic change of the soft tissues surrounding the knee air E ease. No abscess. No nasal cavity masses. No orbital inflammation.  IMPRESSION: Chronic changes as described. Cellulitic change of the soft tissues of the nose without visible abscess.   Electronically Signed   By: Davonna BellingJohn  Curnes M.D.   On: 08/27/2014 12:18   Results/Tests Pending at Time of Discharge: Blood Cultures  Discharge Medications:    Medication List    STOP taking these medications        promethazine 25 MG tablet  Commonly known as:  PHENERGAN     traZODone 50 MG tablet  Commonly known as:  DESYREL      TAKE these medications        amLODipine 5 MG tablet  Commonly known as:  NORVASC  Take 1 tablet (5 mg total) by mouth daily.     clindamycin 300 MG capsule  Commonly known as:  CLEOCIN  Take 1 capsule (300 mg total) by mouth 3 (three) times daily.     traMADol 50 MG tablet  Commonly known as:  ULTRAM  Take 1 tablet (50 mg total) by mouth every 6 (six) hours as needed for moderate pain.        Discharge Instructions: Please refer to Patient Instructions section of EMR for full details.  Patient was counseled important signs and symptoms that should prompt return to medical care, changes in medications, dietary instructions, activity restrictions, and follow up appointments.   Follow-Up Appointments:     Follow-up Information    Follow up with [facility].   Why:  [facility] follow up appointment , Monday, Sep 03, 2014 at 2pm.   Contact information:   [address] [place] [phone]      [place], [place] 08/28/2014, 10:13 PM PGY-1, [facility]

## 2014-08-28 NOTE — Progress Notes (Signed)
Family Medicine Teaching Service Daily Progress Note Intern Pager: [phone]  Patient name: [name] Medical record number: [id number] Date of birth: January 09, 1971 Age: 44 y.o. Gender: male  Primary Care Provider: No PCP Per Patient Consultants: Home Code Status: Full  Assessment and Plan: Virgal Catalfamo is a 44 y.o. male presenting with cellulitis of the nose. PMH is significant for depression.  # Cellulitis, located on nose: ENT contacted in ED and recommended admission due to concerns for spreading. May discharge if improvement noted on antibiotics. If no improvement, obtain imaging and officially consult ENT. High likelihood of MRSA due to location.  - WBC 11.9 > 13.2 > 12.2 - Lactic Acid 0.8 - HIV non-reactive - A1C 5.2 - Follow up Blood Cultures--no growth to date - Maxillofacial CT without signs of abscess - IV Clindamycin (4/24>>4/25), Oral Clindamycin (4/25>>) - Tramadol  q6hr PRN - Consider ENT consult if not improvement - Social work consult for Primary Care  # Hypertension: BP 133-156 / 82-94 in ED. Not currently on medications - BP 125/80 this am - Continue to monitor - Amlodipine   # Tobacco abuse: Smokes 5-6 cigarettes per day. Refuses nicotine patch - Continue to counsel on smoking cessation  # Polysubstance Abuse/Depression: Patient denies. Currently not on any medications. Chart review shows psychiatric admission on 06/09/14 for suicidal ideation after discovering his fiance was cheating on him. UDS negative. Discharged on 2/10 following improvement. Last positive UDS in 02/2013 [facility])  FEN/GI: Regular Diet Prophylaxis: SCDs  Disposition: Admit to [facility] Medicine Teaching Service, McDiarmid attending  Subjective:  Feeling well today. Notes significant improvement in swelling, redness, and pain. Ready for discharge today, but wants note for his work.  Objective: Temp:  [98.6 F (37 C)-99.8 F (37.7 C)] 98.9 F (37.2 C) (04/26 0540) Pulse Rate:   [77-90] 77 (04/26 0540) Resp:  [17-18] 17 (04/26 0540) BP: (125-143)/(78-87) 125/80 mmHg (04/26 0540) SpO2:  [97 %-100 %] 99 % (04/26 0540) Weight:  [154 lb 6 oz (70.024 kg)] 154 lb 6 oz (70.024 kg) (04/25 2108) Physical Exam: General: 44yo male resting comfortably in no apparent distress HEENT: small improvement in edema and erythema of nose, improvement in tenderness of nose, swollen nasal turbinates without signs of drainage Cardiovascular: S1 and S2 noted, no murmurs/rubs/gallops, regular rate and rhythm Respiratory: Clear to auscultation bilaterally, no wheezes noted, no increased work of breathing Abdomen: Soft and nondistended, bowel sounds noted, no masses or tenderness to palpation Extremities: No edema noted  Laboratory:  Recent Labs Lab 08/26/14 1311 08/26/14 1348 08/27/14 0554  WBC 11.9*  --  13.2*  HGB 12.9* 13.9 12.2*  HCT 39.3 41.0 38.5*  PLT 261  --  290    Recent Labs Lab 08/26/14 1348 08/27/14 0554  NA 143 138  K 4.1 3.6  CL 102 100  CO2  --  29  BUN 7 6  CREATININE 0.90 1.05  CALCIUM  --  8.9  GLUCOSE 92 109*  - Lactic Acid 0.8  Imaging/Diagnostic Tests: Dg Chest 2 View  08/26/2014   CLINICAL DATA:  Nasal congestion and dizziness.  Back pain  EXAM: CHEST  2 VIEW  COMPARISON:  04/07/2014  FINDINGS: The heart size and mediastinal contours are within normal limits. Both lungs are clear. The visualized skeletal structures are unremarkable.  IMPRESSION: No active cardiopulmonary disease.   Electronically Signed   By: Marlan Palau M.D.   On: 08/26/2014 14:53   Ct Maxillofacial W/cm  08/27/2014   CLINICAL DATA:  Swelling at  the tip of the nose. Cellulitis. No other reported history.  EXAM: CT MAXILLOFACIAL WITH CONTRAST  TECHNIQUE: Multidetector CT imaging of the maxillofacial structures was performed with intravenous contrast. Multiplanar CT image reconstructions were also generated. A small metallic BB was placed on the right temple in order to reliably  differentiate right from left.  CONTRAST:  80mL OMNIPAQUE IOHEXOL 300 MG/ML  SOLN  COMPARISON:  None.  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 LEFT-to-RIGHT deviation.  Gentle bowing of the nasal septum RIGHT to LEFT. This measures 2-3 mm. BILATERAL middle turbinate concha bullosa. BILATERAL inferior turbinate mucosal thickening and hypertrophy. Discontinuity of the RIGHT orbital floor, possible old blowout injury.  Periodontal disease. Significant periapical lucencies surround a LEFT maxillary molar. No evidence for odontogenic sinusitis. Significant dental caries RIGHT maxillary and mandibular molars also with slight periapical lucency.  Paranasal sinuses are clear. No sinus or mastoid air fluid levels. Negative nasopharynx.  Mild cellulitic change of the soft tissues surrounding the knee air E ease. No abscess. No nasal cavity masses. No orbital inflammation.  IMPRESSION: Chronic changes as described. Cellulitic change of the soft tissues of the nose without visible abscess.   Electronically Signed   By: Davonna BellingJohn  Curnes M.D.   On: 08/27/2014 12:18   Araceli Bouchealeigh N Enrika Aguado, DO 08/28/2014, 8:13 AM PGY-1, [facility] FPTS Intern pager: [phone], text pages welcome

## 2014-08-28 NOTE — Discharge Instructions (Signed)
Cellulitis °Cellulitis is an infection of the skin and the tissue beneath it. The infected area is usually red and tender. Cellulitis occurs most often in the arms and lower legs.  °CAUSES  °Cellulitis is caused by bacteria that enter the skin through cracks or cuts in the skin. The most common types of bacteria that cause cellulitis are staphylococci and streptococci. °SIGNS AND SYMPTOMS  °· Redness and warmth. °· Swelling. °· Tenderness or pain. °· Fever. °DIAGNOSIS  °Your health care provider can usually determine what is wrong based on a physical exam. Blood tests may also be done. °TREATMENT  °Treatment usually involves taking an antibiotic medicine. °HOME CARE INSTRUCTIONS  °· Take your antibiotic medicine as directed by your health care provider. Finish the antibiotic even if you start to feel better. °· Keep the infected arm or leg elevated to reduce swelling. °· Apply a warm cloth to the affected area up to 4 times per day to relieve pain. °· Take medicines only as directed by your health care provider. °· Keep all follow-up visits as directed by your health care provider. °SEEK MEDICAL CARE IF:  °· You notice red streaks coming from the infected area. °· Your red area gets larger or turns dark in color. °· Your bone or joint underneath the infected area becomes painful after the skin has healed. °· Your infection returns in the same area or another area. °· You notice a swollen bump in the infected area. °· You develop new symptoms. °· You have a fever. °SEEK IMMEDIATE MEDICAL CARE IF:  °· You feel very sleepy. °· You develop vomiting or diarrhea. °· You have a general ill feeling (malaise) with muscle aches and pains. °MAKE SURE YOU:  °· Understand these instructions. °· Will watch your condition. °· Will get help right away if you are not doing well or get worse. °Document Released: 01/28/2005 Document Revised: 09/04/2013 Document Reviewed: 07/06/2011 °ExitCare® Patient Information ©2015 [employer].  This information is not intended to replace advice given to you by your health care provider. Make sure you discuss any questions you have with your health care provider. ° °Hypertension °Hypertension, commonly called high blood pressure, is when the force of blood pumping through your arteries is too strong. Your arteries are the blood vessels that carry blood from your heart throughout your body. A blood pressure reading consists of a higher number over a lower number, such as 110/72. The higher number (systolic) is the pressure inside your arteries when your heart pumps. The lower number (diastolic) is the pressure inside your arteries when your heart relaxes. Ideally you want your blood pressure below 120/80. °Hypertension forces your heart to work harder to pump blood. Your arteries may become narrow or stiff. Having hypertension puts you at risk for heart disease, stroke, and other problems.  °RISK FACTORS °Some risk factors for high blood pressure are controllable. Others are not.  °Risk factors you cannot control include:  °· Race. You may be at higher risk if you are African American. °· Age. Risk increases with age. °· Gender. Men are at higher risk than women before age 45 years. After age 65, women are at higher risk than men. °Risk factors you can control include: °· Not getting enough exercise or physical activity. °· Being overweight. °· Getting too much fat, sugar, calories, or salt in your diet. °· Drinking too much alcohol. °SIGNS AND SYMPTOMS °Hypertension does not usually cause signs or symptoms. Extremely high blood pressure (hypertensive crisis) may   cause headache, anxiety, shortness of breath, and nosebleed. °DIAGNOSIS  °To check if you have hypertension, your health care provider will measure your blood pressure while you are seated, with your arm held at the level of your heart. It should be measured at least twice using the same arm. Certain conditions can cause a difference in blood  pressure between your right and left arms. A blood pressure reading that is higher than normal on one occasion does not mean that you need treatment. If one blood pressure reading is high, ask your health care provider about having it checked again. °TREATMENT  °Treating high blood pressure includes making lifestyle changes and possibly taking medicine. Living a healthy lifestyle can help lower high blood pressure. You may need to change some of your habits. °Lifestyle changes may include: °· Following the DASH diet. This diet is high in fruits, vegetables, and whole grains. It is low in salt, red meat, and added sugars. °· Getting at least 2½ hours of brisk physical activity every week. °· Losing weight if necessary. °· Not smoking. °· Limiting alcoholic beverages. °· Learning ways to reduce stress. ° If lifestyle changes are not enough to get your blood pressure under control, your health care provider may prescribe medicine. You may need to take more than one. Work closely with your health care provider to understand the risks and benefits. °HOME CARE INSTRUCTIONS °· Have your blood pressure rechecked as directed by your health care provider.   °· Take medicines only as directed by your health care provider. Follow the directions carefully. Blood pressure medicines must be taken as prescribed. The medicine does not work as well when you skip doses. Skipping doses also puts you at risk for problems.   °· Do not smoke.   °· Monitor your blood pressure at home as directed by your health care provider.  °SEEK MEDICAL CARE IF:  °· You think you are having a reaction to medicines taken. °· You have recurrent headaches or feel dizzy. °· You have swelling in your ankles. °· You have trouble with your vision. °SEEK IMMEDIATE MEDICAL CARE IF: °· You develop a severe headache or confusion. °· You have unusual weakness, numbness, or feel faint. °· You have severe chest or abdominal pain. °· You vomit repeatedly. °· You have  trouble breathing. °MAKE SURE YOU:  °· Understand these instructions. °· Will watch your condition. °· Will get help right away if you are not doing well or get worse. °Document Released: 04/20/2005 Document Revised: 09/04/2013 Document Reviewed: 02/10/2013 °ExitCare® Patient Information ©2015 [employer]. This information is not intended to replace advice given to you by your health care provider. Make sure you discuss any questions you have with your health care provider. ° °

## 2014-08-28 NOTE — Progress Notes (Signed)
Pt discharge instructions and prescriptions given, pt verbalized understanding. Pt waiting on transportation home.

## 2014-08-28 NOTE — Care Management Note (Signed)
CARE MANAGEMENT NOTE 08/28/2014  [name]   Account Number:  [id number]  Date Initiated:  08/28/2014  Documentation initiated by:  Birney Belshe  Subjective/Objective Assessment:   CM has followed this pt for progression and d/c planning.     Action/Plan:   [facility] Sep 03, 2014 at 2pm and [facility].   Anticipated DC Date:  08/28/2014   Anticipated DC Plan:  HOME/SELF CARE      DC Planning Services  MATCH Program  [facility]      Choice offered to / List presented to:             Status of service:  Completed, signed off Medicare Important Message given?  NO (If response is "NO", the following Medicare IM given date fields will be blank) Date Medicare IM given:   Medicare IM given by:   Date Additional Medicare IM given:   Additional Medicare IM given by:    Discharge Disposition:  HOME/SELF CARE  Per UR Regulation:    If discussed at Long Length of Stay Meetings, dates discussed:    Comments:

## 2014-09-01 LAB — CULTURE, BLOOD (ROUTINE X 2)
CULTURE: NO GROWTH
Culture: NO GROWTH

## 2014-09-03 ENCOUNTER — Ambulatory Visit: Payer: Self-pay | Attending: Family Medicine | Admitting: Family Medicine

## 2014-09-03 ENCOUNTER — Encounter: Payer: Self-pay | Admitting: Family Medicine

## 2014-09-03 VITALS — BP 123/77 | HR 92 | Temp 99.2°F | Resp 18 | Ht 69.0 in | Wt 143.0 lb

## 2014-09-03 DIAGNOSIS — J340 Abscess, furuncle and carbuncle of nose: Secondary | ICD-10-CM | POA: Insufficient documentation

## 2014-09-03 DIAGNOSIS — F17200 Nicotine dependence, unspecified, uncomplicated: Secondary | ICD-10-CM | POA: Insufficient documentation

## 2014-09-03 DIAGNOSIS — I10 Essential (primary) hypertension: Secondary | ICD-10-CM | POA: Insufficient documentation

## 2014-09-03 DIAGNOSIS — Z720 Tobacco use: Secondary | ICD-10-CM

## 2014-09-03 LAB — CBC WITH DIFFERENTIAL/PLATELET
BASOS ABS: 0.1 10*3/uL (ref 0.0–0.1)
Basophils Relative: 1 % (ref 0–1)
Eosinophils Absolute: 0.3 10*3/uL (ref 0.0–0.7)
Eosinophils Relative: 4 % (ref 0–5)
HCT: 35.2 % — ABNORMAL LOW (ref 39.0–52.0)
Hemoglobin: 11.6 g/dL — ABNORMAL LOW (ref 13.0–17.0)
Lymphocytes Relative: 33 % (ref 12–46)
Lymphs Abs: 2.2 10*3/uL (ref 0.7–4.0)
MCH: 29.7 pg (ref 26.0–34.0)
MCHC: 33 g/dL (ref 30.0–36.0)
MCV: 90 fL (ref 78.0–100.0)
MPV: 9.4 fL (ref 8.6–12.4)
Monocytes Absolute: 0.6 10*3/uL (ref 0.1–1.0)
Monocytes Relative: 9 % (ref 3–12)
NEUTROS ABS: 3.6 10*3/uL (ref 1.7–7.7)
Neutrophils Relative %: 53 % (ref 43–77)
PLATELETS: 331 10*3/uL (ref 150–400)
RBC: 3.91 MIL/uL — AB (ref 4.22–5.81)
RDW: 13 % (ref 11.5–15.5)
WBC: 6.7 10*3/uL (ref 4.0–10.5)

## 2014-09-03 MED ORDER — TRAMADOL HCL 50 MG PO TABS: 50.0000 mg | ORAL_TABLET | Freq: Four times a day (QID) | ORAL | Status: DC | PRN

## 2014-09-03 NOTE — Progress Notes (Signed)
Subjective:    Patient ID: [name], male    DOB: 02-25-1971, 44 y.o.   MRN: [id number]  HPI  Admit Date: 08/26/14 Discharge Date: 08/28/14  [name] was [facility] for cellulitis of the nose after he had presented with edema and redness of the tip of the nose which progressively worsened to involve both alar but not the columella. In the ER he was found to be afebrile, WBC was 11.9; he was admitted and placed on IV Clindamycin. He was also noted to have elevated BP of up to 150s systolic and was placed on Amlodipine.CT maxillofacial with contrast revealed cellulitic change of the soft tissues of the nose without visible abscess. His condition improved and he was transitioned to oral Clindamycin.   Blood culture was negative; WBC was 12.2 at discharge.  Interval History: He is still on Clindamycin and complains he still has some residual pain at the tip of his nose however his pain is much improved from the time of presentation. He would also like to come off his antihypertensive as he had no previous history of hypertension prior to presentation.  Review of Systems  Constitutional: Negative for activity change and appetite change.  HENT: Negative for sinus pressure and sore throat.        Tenderness at the tip of the nose  Eyes: Negative for visual disturbance.  Respiratory: Negative for chest tightness and shortness of breath.   Cardiovascular: Negative for chest pain and palpitations.  Gastrointestinal: Negative for abdominal pain and abdominal distention.  Endocrine: Negative for cold intolerance, heat intolerance and polyphagia.  Genitourinary: Negative for dysuria, frequency and difficulty urinating.  Musculoskeletal: Negative for back pain, joint swelling and arthralgias.  Skin: Negative for color change.  Neurological: Negative for dizziness, tremors and weakness.  Psychiatric/Behavioral: Negative for suicidal ideas and behavioral problems.         Objective: Filed Vitals:   09/03/14 1405  BP: 123/77  Pulse: 92  Temp: 99.2 F (37.3 C)  Resp: 18      Physical Exam  Constitutional: He is oriented to person, place, and time. He appears well-developed and well-nourished.  HENT:  Head: Normocephalic and atraumatic.  Right Ear: External ear normal.  Left Ear: External ear normal.  Nose: Sinus tenderness present. No mucosal edema or septal deviation. Right sinus exhibits no maxillary sinus tenderness and no frontal sinus tenderness. Left sinus exhibits no maxillary sinus tenderness and no frontal sinus tenderness.    Eyes: Conjunctivae and EOM are normal. Pupils are equal, round, and reactive to light.  Neck: Normal range of motion. Neck supple. No tracheal deviation present.  Cardiovascular: Normal rate, regular rhythm and normal heart sounds.   No murmur heard. Pulmonary/Chest: Effort normal and breath sounds normal. No respiratory distress. He has no wheezes. He exhibits no tenderness.  Abdominal: Soft. Bowel sounds are normal. He exhibits no mass. There is no tenderness.  Musculoskeletal: Normal range of motion. He exhibits no edema or tenderness.  Neurological: He is alert and oriented to person, place, and time.  Skin: Skin is warm and dry.  Psychiatric: He has a normal mood and affect.    Lab Results  Component Value Date   WBC 12.2* 08/28/2014   HGB 12.3* 08/28/2014   HCT 38.3* 08/28/2014   MCV 90.8 08/28/2014   PLT 283 08/28/2014     EXAM: CT MAXILLOFACIAL WITH CONTRAST  TECHNIQUE: Multidetector CT imaging of the maxillofacial structures was performed with intravenous contrast. Multiplanar CT  image reconstructions were also generated. A small metallic BB was placed on the right temple in order to reliably differentiate right from left.  CONTRAST: 80mL OMNIPAQUE IOHEXOL 300 MG/ML SOLN  COMPARISON: None.  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  LEFT-to-RIGHT deviation.  Gentle bowing of the nasal septum RIGHT to LEFT. This measures 2-3 mm. BILATERAL middle turbinate concha bullosa. BILATERAL inferior turbinate mucosal thickening and hypertrophy. Discontinuity of the RIGHT orbital floor, possible old blowout injury.  Periodontal disease. Significant periapical lucencies surround a LEFT maxillary molar. No evidence for odontogenic sinusitis. Significant dental caries RIGHT maxillary and mandibular molars also with slight periapical lucency.  Paranasal sinuses are clear. No sinus or mastoid air fluid levels. Negative nasopharynx.  Mild cellulitic change of the soft tissues surrounding the knee air E ease. No abscess. No nasal cavity masses. No orbital inflammation.  IMPRESSION: Chronic changes as described. Cellulitic change of the soft tissues of the nose without visible abscess.   Electronically Signed  By: Davonna BellingJohn Curnes M.D.  On: 08/27/2014 12:18       Assessment & Plan:  44 year old male recently hospitalized for cellulitis of the nose currently on oral Clindamycin and reports significant improvement in symptoms.  Cellulitis of the nose:  Improved significantly; advised to complete course of clindamycin. We'll repeat CBC today to follow-up on leukocytosis.  Hypertension:  Blood pressure is normal today despite the fact that he is not taking amlodipine He would like to try lifestyle modifications and so I have placed amlodipine on hold for now and his blood pressure will be assessed at his next office visit and if normal will discontinue Amlodipine.  Tobacco use disorder: Smoking cessation support: smoking cessation hotline: 1-800-QUIT-NOW.  Smoking cessation classes are available through [facility]. Call [phone] or visit our website at [email or web address].  Spent 4 minutes counseling on smoking cessation and patient is not ready to quit.    Disclaimer: This note was  dictated with voice recognition software. Similar sounding words can inadvertently be transcribed and this note may contain transcription errors which may not have been corrected upon publication of note. Low-sodium diet emphasized.

## 2014-09-03 NOTE — Patient Instructions (Signed)

## 2014-09-03 NOTE — Progress Notes (Signed)
Patient hospitalized, for 2-3 days last week, for cellulitis of nose. Patient reports nose feeling "maybe like a soreness, I can't really explain it", at level 8. Patient wants to discuss pain med options.

## 2014-09-04 ENCOUNTER — Telehealth: Payer: Self-pay

## 2014-09-04 NOTE — Telephone Encounter (Signed)
-----   Message from Enobong Amao, MD sent at 09/03/2014 11:34 PM EDT ----- Wbc have trended down to normal which is an evidence of resolution of infection 

## 2014-09-04 NOTE — Telephone Encounter (Signed)
Patient is calling nurse back about blood work results, please f/u

## 2014-09-04 NOTE — Telephone Encounter (Signed)
Nurse called patient, male answered. Nurse left message for patient to [facility] at [phone].

## 2014-09-04 NOTE — Telephone Encounter (Signed)
Patient returned call to nurse, patient verified date of birth. Patient aware of WBCs are back to normal, which is a sign of the infection being resolved. Patient voices understanding.

## 2014-09-04 NOTE — Telephone Encounter (Signed)
-----   Message from Jaclyn ShaggyEnobong Amao, MD sent at 09/03/2014 11:34 PM EDT ----- Wbc have trended down to normal which is an evidence of resolution of infection

## 2014-10-02 ENCOUNTER — Ambulatory Visit: Payer: Self-pay | Admitting: Family Medicine

## 2014-10-16 ENCOUNTER — Encounter (HOSPITAL_COMMUNITY): Payer: Self-pay | Admitting: *Deleted

## 2014-10-16 DIAGNOSIS — G8929 Other chronic pain: Secondary | ICD-10-CM | POA: Insufficient documentation

## 2014-10-16 DIAGNOSIS — Z720 Tobacco use: Secondary | ICD-10-CM | POA: Insufficient documentation

## 2014-10-16 DIAGNOSIS — Z862 Personal history of diseases of the blood and blood-forming organs and certain disorders involving the immune mechanism: Secondary | ICD-10-CM | POA: Insufficient documentation

## 2014-10-16 DIAGNOSIS — Z8719 Personal history of other diseases of the digestive system: Secondary | ICD-10-CM | POA: Insufficient documentation

## 2014-10-16 DIAGNOSIS — F329 Major depressive disorder, single episode, unspecified: Secondary | ICD-10-CM | POA: Insufficient documentation

## 2014-10-16 DIAGNOSIS — M545 Low back pain: Secondary | ICD-10-CM | POA: Insufficient documentation

## 2014-10-16 NOTE — ED Notes (Addendum)
Pt in c/o middle to lower back pain, states he has a history of this, denies other symptoms with this, no distress noted, reports pain radiates into right flank at times

## 2014-10-17 ENCOUNTER — Emergency Department (HOSPITAL_COMMUNITY)
Admission: EM | Admit: 2014-10-17 | Discharge: 2014-10-17 | Disposition: A | Discharge status: Home / Self Care | Payer: Self-pay | Attending: Emergency Medicine | Admitting: Emergency Medicine

## 2014-10-17 ENCOUNTER — Ambulatory Visit: Payer: Self-pay | Admitting: Family Medicine

## 2014-10-17 DIAGNOSIS — M549 Dorsalgia, unspecified: Secondary | ICD-10-CM

## 2014-10-17 DIAGNOSIS — G8929 Other chronic pain: Secondary | ICD-10-CM

## 2014-10-17 MED ORDER — TRAMADOL HCL 50 MG PO TABS: 50.0000 mg | ORAL_TABLET | Freq: Two times a day (BID) | ORAL | Status: DC | PRN

## 2014-10-17 MED ORDER — TRAMADOL HCL 50 MG PO TABS: 50.0000 mg | ORAL_TABLET | Freq: Once | ORAL | Status: DC

## 2014-10-17 MED ORDER — KETOROLAC TROMETHAMINE 60 MG/2ML IM SOLN
60.0000 mg | Freq: Once | INTRAMUSCULAR | Status: DC
  Administered 2014-10-17: 60 mg via INTRAMUSCULAR
  Filled 2014-10-17: qty 2

## 2014-10-17 NOTE — ED Notes (Signed)
MD at bedside. 

## 2014-10-17 NOTE — Discharge Instructions (Signed)
Chronic Back Pain [name], take tramadol as prescribed. See a primary care physician within 3 days for management of your chronic back pain. If any symptoms worsen come back to the emergency department immediately. Thank you.  When back pain lasts longer than 3 months, it is called chronic back pain.People with chronic back pain often go through certain periods that are more intense (flare-ups).  CAUSES Chronic back pain can be caused by wear and tear (degeneration) on different structures in your back. These structures include:  The bones of your spine (vertebrae) and the joints surrounding your spinal cord and nerve roots (facets).  The strong, fibrous tissues that connect your vertebrae (ligaments). Degeneration of these structures may result in pressure on your nerves. This can lead to constant pain. HOME CARE INSTRUCTIONS  Avoid bending, heavy lifting, prolonged sitting, and activities which make the problem worse.  Take brief periods of rest throughout the day to reduce your pain. Lying down or standing usually is better than sitting while you are resting.  Take over-the-counter or prescription medicines only as directed by your caregiver. SEEK IMMEDIATE MEDICAL CARE IF:   You have weakness or numbness in one of your legs or feet.  You have trouble controlling your bladder or bowels.  You have nausea, vomiting, abdominal pain, shortness of breath, or fainting. Document Released: 05/28/2004 Document Revised: 07/13/2011 Document Reviewed: 04/04/2011 [facility] Patient Information 2015 [place], [name]. This information is not intended to replace advice given to you by your health care provider. Make sure you discuss any questions you have with your health care provider.

## 2014-10-17 NOTE — ED Notes (Signed)
Pt very unhappy at time of discharge, states to put his discharge instructions and prescriptions in the shredder. Pt refuses to sign.
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
## 2015-03-16 ENCOUNTER — Encounter (HOSPITAL_COMMUNITY): Payer: Self-pay | Admitting: *Deleted

## 2015-03-16 ENCOUNTER — Emergency Department (HOSPITAL_COMMUNITY)
Admission: EM | Admit: 2015-03-16 | Discharge: 2015-03-16 | Disposition: A | Discharge status: Home / Self Care | Payer: Self-pay | Attending: Emergency Medicine | Admitting: Emergency Medicine

## 2015-03-16 DIAGNOSIS — Z720 Tobacco use: Secondary | ICD-10-CM | POA: Insufficient documentation

## 2015-03-16 DIAGNOSIS — Y9389 Activity, other specified: Secondary | ICD-10-CM | POA: Insufficient documentation

## 2015-03-16 DIAGNOSIS — Y999 Unspecified external cause status: Secondary | ICD-10-CM | POA: Insufficient documentation

## 2015-03-16 DIAGNOSIS — Z8659 Personal history of other mental and behavioral disorders: Secondary | ICD-10-CM | POA: Insufficient documentation

## 2015-03-16 DIAGNOSIS — W503XXA Accidental bite by another person, initial encounter: Secondary | ICD-10-CM

## 2015-03-16 DIAGNOSIS — Z862 Personal history of diseases of the blood and blood-forming organs and certain disorders involving the immune mechanism: Secondary | ICD-10-CM | POA: Insufficient documentation

## 2015-03-16 DIAGNOSIS — Y9289 Other specified places as the place of occurrence of the external cause: Secondary | ICD-10-CM | POA: Insufficient documentation

## 2015-03-16 DIAGNOSIS — S61452A Open bite of left hand, initial encounter: Secondary | ICD-10-CM | POA: Insufficient documentation

## 2015-03-16 DIAGNOSIS — S50811A Abrasion of right forearm, initial encounter: Secondary | ICD-10-CM | POA: Insufficient documentation

## 2015-03-16 MED ORDER — TRAMADOL HCL 50 MG PO TABS: 50.0000 mg | ORAL_TABLET | Freq: Two times a day (BID) | ORAL | Status: DC | PRN

## 2015-03-16 MED ORDER — IBUPROFEN 400 MG PO TABS
800.0000 mg | ORAL_TABLET | Freq: Once | ORAL | Status: DC
  Filled 2015-03-16: qty 2

## 2015-03-16 MED ORDER — TRAMADOL HCL 50 MG PO TABS
50.0000 mg | ORAL_TABLET | Freq: Once | ORAL | Status: AC
  Administered 2015-03-16: 50 mg via ORAL
  Filled 2015-03-16: qty 1

## 2015-03-16 MED ORDER — AMOXICILLIN-POT CLAVULANATE 875-125 MG PO TABS
1.0000 | ORAL_TABLET | Freq: Once | ORAL | Status: AC
  Administered 2015-03-16: 1 via ORAL
  Filled 2015-03-16: qty 1

## 2015-03-16 MED ORDER — AMOXICILLIN-POT CLAVULANATE 875-125 MG PO TABS: 1.0000 | ORAL_TABLET | Freq: Two times a day (BID) | ORAL | Status: DC

## 2015-03-16 NOTE — ED Notes (Signed)
No answer
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
## 2015-03-16 NOTE — ED Notes (Signed)
Patient left at this time with all belongings. 
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
## 2015-04-05 ENCOUNTER — Encounter (HOSPITAL_COMMUNITY): Payer: Self-pay | Admitting: Emergency Medicine

## 2015-04-05 DIAGNOSIS — Z862 Personal history of diseases of the blood and blood-forming organs and certain disorders involving the immune mechanism: Secondary | ICD-10-CM | POA: Insufficient documentation

## 2015-04-05 DIAGNOSIS — F329 Major depressive disorder, single episode, unspecified: Secondary | ICD-10-CM | POA: Insufficient documentation

## 2015-04-05 DIAGNOSIS — Y999 Unspecified external cause status: Secondary | ICD-10-CM | POA: Insufficient documentation

## 2015-04-05 DIAGNOSIS — F17210 Nicotine dependence, cigarettes, uncomplicated: Secondary | ICD-10-CM | POA: Insufficient documentation

## 2015-04-05 DIAGNOSIS — Y929 Unspecified place or not applicable: Secondary | ICD-10-CM | POA: Insufficient documentation

## 2015-04-05 DIAGNOSIS — X58XXXA Exposure to other specified factors, initial encounter: Secondary | ICD-10-CM | POA: Insufficient documentation

## 2015-04-05 DIAGNOSIS — Y9389 Activity, other specified: Secondary | ICD-10-CM | POA: Insufficient documentation

## 2015-04-05 DIAGNOSIS — G8929 Other chronic pain: Secondary | ICD-10-CM | POA: Insufficient documentation

## 2015-04-05 DIAGNOSIS — Z8719 Personal history of other diseases of the digestive system: Secondary | ICD-10-CM | POA: Insufficient documentation

## 2015-04-05 DIAGNOSIS — S39012A Strain of muscle, fascia and tendon of lower back, initial encounter: Secondary | ICD-10-CM | POA: Insufficient documentation

## 2015-04-05 NOTE — ED Notes (Signed)
Pt. reports low back pain onset 4 days ago , denies injury or fall , pt. stated " I almost fell today " , denies hematuria or urinary discomfort .

## 2015-04-06 ENCOUNTER — Emergency Department (HOSPITAL_COMMUNITY)
Admission: EM | Admit: 2015-04-06 | Discharge: 2015-04-06 | Disposition: A | Discharge status: Home / Self Care | Payer: Self-pay | Attending: Emergency Medicine | Admitting: Emergency Medicine

## 2015-04-06 DIAGNOSIS — S39012A Strain of muscle, fascia and tendon of lower back, initial encounter: Secondary | ICD-10-CM

## 2015-04-06 LAB — URINALYSIS, ROUTINE W REFLEX MICROSCOPIC
Bilirubin Urine: NEGATIVE
GLUCOSE, UA: NEGATIVE mg/dL
HGB URINE DIPSTICK: NEGATIVE
Ketones, ur: NEGATIVE mg/dL
LEUKOCYTES UA: NEGATIVE
Nitrite: NEGATIVE
PROTEIN: NEGATIVE mg/dL
SPECIFIC GRAVITY, URINE: 1.017 (ref 1.005–1.030)
pH: 6.5 (ref 5.0–8.0)

## 2015-04-06 LAB — COMPREHENSIVE METABOLIC PANEL
ALBUMIN: 4 g/dL (ref 3.5–5.0)
ALK PHOS: 37 U/L — AB (ref 38–126)
ALT: 11 U/L — ABNORMAL LOW (ref 17–63)
AST: 18 U/L (ref 15–41)
Anion gap: 7 (ref 5–15)
BUN: 8 mg/dL (ref 6–20)
CO2: 32 mmol/L (ref 22–32)
Calcium: 9.7 mg/dL (ref 8.9–10.3)
Chloride: 103 mmol/L (ref 101–111)
Creatinine, Ser: 1 mg/dL (ref 0.61–1.24)
GFR calc Af Amer: >60 mL/min (ref >60)
GFR calc non Af Amer: >60 mL/min (ref >60)
GLUCOSE: 96 mg/dL (ref 65–99)
Potassium: 4.3 mmol/L (ref 3.5–5.1)
Sodium: 142 mmol/L (ref 135–145)
TOTAL PROTEIN: 7 g/dL (ref 6.5–8.1)
Total Bilirubin: 0.6 mg/dL (ref 0.3–1.2)

## 2015-04-06 LAB — CBC
HCT: 39.3 % (ref 39.0–52.0)
Hemoglobin: 12.4 g/dL — ABNORMAL LOW (ref 13.0–17.0)
MCH: 29.7 pg (ref 26.0–34.0)
MCHC: 31.6 g/dL (ref 30.0–36.0)
MCV: 94 fL (ref 78.0–100.0)
Platelets: 239 10*3/uL (ref 150–400)
RBC: 4.18 MIL/uL — ABNORMAL LOW (ref 4.22–5.81)
RDW: 13.1 % (ref 11.5–15.5)
WBC: 7.8 10*3/uL (ref 4.0–10.5)

## 2015-04-06 MED ORDER — METHOCARBAMOL 500 MG PO TABS: 500.0000 mg | ORAL_TABLET | Freq: Two times a day (BID) | ORAL | Status: DC | PRN

## 2015-04-06 MED ORDER — TRAMADOL HCL 50 MG PO TABS: 50.0000 mg | ORAL_TABLET | Freq: Four times a day (QID) | ORAL | Status: DC | PRN

## 2015-04-06 MED ORDER — TRAMADOL HCL 50 MG PO TABS
50.0000 mg | ORAL_TABLET | Freq: Once | ORAL | Status: AC
  Administered 2015-04-06: 50 mg via ORAL
  Filled 2015-04-06: qty 1

## 2015-04-06 NOTE — ED Provider Notes (Signed)
CSN: [id number]     Arrival date & time 04/05/15  2342 History  By signing my name below, I, Doreatha MartinEva Mathews, attest that this documentation has been prepared under the direction and in the presence of Eber HongBrian Armelia Penton, MD. Electronically Signed: Doreatha MartinEva Mathews, ED Scribe. 04/06/2015. 12:54 AM.    Chief Complaint  Patient presents with  . Back Pain   The history is provided by the patient. No language interpreter was used.    HPI Comments: [name] is a 44 y.o. male with h/o chronic back pain who presents to the Emergency Department complaining of an exacerbation of chronic lower back pain onset 4 days ago. Pt states his current pains are more persistent, localized, long lasting and stabbing than usual. Pt states that pain is worsened with positional changes and movement. He also notes difficulty ambulating and moving secondary to pain. He notes no recent bending, leaning, heavy lifting, trauma, injuries or falls. No treatments for pain tried PTA. No h/o IV drug use. Pt is not on any daily medications. He states he is allergic to ASA and ibuprofen. He denies pain radiation to the legs, numbness or weakness in the lower extremities, dysuria, frequency, bowel or bladder incontinence, fever.  Past Medical History  Diagnosis Date  . Alcohol abuse   . Anemia   . Severe major depression with psychotic features (HCC)   . Colitis    History reviewed. No pertinent past surgical history. Family History  Problem Relation Age of Onset  . Hypertension Mother   . Hypertension Father   . Hypertension Other    Social History  Substance Use Topics  . Smoking status: Current Every Day Smoker -- 0.25 packs/day    Types: Cigarettes  . Smokeless tobacco: None  . Alcohol Use: No     Comment: former  last use1 year ago    Review of Systems  Constitutional: Negative for fever.  Genitourinary: Negative for dysuria and frequency.       No bowel or bladder incontinence  Musculoskeletal: Positive for back  pain.  Neurological: Negative for weakness and numbness.  All other systems reviewed and are negative.  Allergies  Aspirin; Ibuprofen; Pork-derived products; and Sertraline  Home Medications   Prior to Admission medications   Medication Sig Start Date End Date Taking? Authorizing Provider  amLODipine (NORVASC) 5 MG tablet Take 1 tablet (5 mg total) by mouth daily. Patient not taking: Reported on 10/17/2014 08/28/14   Lora Havensaleigh N Rumley, DO  methocarbamol (ROBAXIN) 500 MG tablet Take 1 tablet (500 mg total) by mouth 2 (two) times daily as needed for muscle spasms. 04/06/15   Eber HongBrian Sonnie Bias, MD  traMADol (ULTRAM) 50 MG tablet Take 1 tablet (50 mg total) by mouth every 6 (six) hours as needed. 04/06/15   Eber HongBrian Thijs Brunton, MD   BP 132/79 mmHg  Pulse 86  Temp(Src) 98.5 F (36.9 C) (Oral)  Resp 20  Ht 5\' 8"  (1.727 m)  Wt 150 lb (68.04 kg)  BMI 22.81 kg/m2  SpO2 99% Physical Exam  Constitutional: He appears well-developed and well-nourished. No distress.  HENT:  Head: Normocephalic and atraumatic.  Mouth/Throat: Oropharynx is clear and moist. No oropharyngeal exudate.  Eyes: Conjunctivae and EOM are normal. Pupils are equal, round, and reactive to light. Right eye exhibits no discharge. Left eye exhibits no discharge. No scleral icterus.  Neck: Normal range of motion. Neck supple. No JVD present. No thyromegaly present.  Cardiovascular: Normal rate, regular rhythm, normal heart sounds and intact distal pulses.  Exam reveals no gallop and no friction rub.   No murmur heard. Pulmonary/Chest: Effort normal and breath sounds normal. No respiratory distress. He has no wheezes. He has no rales.  Abdominal: Soft. Bowel sounds are normal. He exhibits no distension and no mass. There is no tenderness.  Musculoskeletal: Normal range of motion. He exhibits tenderness. He exhibits no edema.  Tenderness to bilateral paraspinal musculature of lumbar spine.  Lymphadenopathy:    He has no cervical adenopathy.   Neurological: He is alert. Coordination normal.  Normal strength in the B LE's, has normal sensation , normal reflexes at the knees, preserved ability to SLR bilaterally without weakness or difficulty.   Skin: Skin is warm and dry. No rash noted. No erythema.  Psychiatric: He has a normal mood and affect. His behavior is normal.  Nursing note and vitals reviewed.  ED Course  Procedures (including critical care time) DIAGNOSTIC STUDIES: Oxygen Saturation is 99% on RA, normal by my interpretation.    COORDINATION OF CARE: 12:52 AM Discussed treatment plan with pt at bedside and pt agreed to plan.   Labs Review Labs Reviewed  COMPREHENSIVE METABOLIC PANEL - Abnormal; Notable for the following:    ALT 11 (*)    Alkaline Phosphatase 37 (*)    All other components within normal limits  CBC - Abnormal; Notable for the following:    RBC 4.18 (*)    Hemoglobin 12.4 (*)    All other components within normal limits  URINALYSIS, ROUTINE W REFLEX MICROSCOPIC (NOT AT [facility])    I have personally reviewed and evaluated these lab results as part of my medical decision-making.  MDM   Final diagnoses:  Lumbar strain, initial encounter   Low risk LBP - likely strain , no red flags, no neuro findings - stable appearing, VS normal, meds given as below.  Labs and Urine reviewed - no acute findings.  I personally performed the services described in this documentation, which was scribed in my presence. The recorded information has been reviewed and is accurate.    Meds given in ED:  Medications  traMADol (ULTRAM) tablet 50 mg (not administered)    New Prescriptions   METHOCARBAMOL (ROBAXIN) 500 MG TABLET    Take 1 tablet (500 mg total) by mouth 2 (two) times daily as needed for muscle spasms.   TRAMADOL (ULTRAM) 50 MG TABLET    Take 1 tablet (50 mg total) by mouth every 6 (six) hours as needed.       Eber Hong, MD 04/06/15 [phone]

## 2015-04-06 NOTE — Discharge Instructions (Signed)

## 2015-05-15 ENCOUNTER — Emergency Department (HOSPITAL_COMMUNITY)
Admission: EM | Admit: 2015-05-15 | Discharge: 2015-05-15 | Disposition: A | Discharge status: Home / Self Care | Payer: Self-pay | Attending: Emergency Medicine | Admitting: Emergency Medicine

## 2015-05-15 ENCOUNTER — Encounter (HOSPITAL_COMMUNITY): Payer: Self-pay | Admitting: *Deleted

## 2015-05-15 ENCOUNTER — Emergency Department (HOSPITAL_COMMUNITY): Payer: Self-pay

## 2015-05-15 DIAGNOSIS — G8929 Other chronic pain: Secondary | ICD-10-CM | POA: Insufficient documentation

## 2015-05-15 DIAGNOSIS — Z720 Tobacco use: Secondary | ICD-10-CM

## 2015-05-15 DIAGNOSIS — M542 Cervicalgia: Secondary | ICD-10-CM | POA: Insufficient documentation

## 2015-05-15 DIAGNOSIS — Z862 Personal history of diseases of the blood and blood-forming organs and certain disorders involving the immune mechanism: Secondary | ICD-10-CM | POA: Insufficient documentation

## 2015-05-15 DIAGNOSIS — R42 Dizziness and giddiness: Secondary | ICD-10-CM | POA: Insufficient documentation

## 2015-05-15 DIAGNOSIS — Z8659 Personal history of other mental and behavioral disorders: Secondary | ICD-10-CM | POA: Insufficient documentation

## 2015-05-15 DIAGNOSIS — G44209 Tension-type headache, unspecified, not intractable: Secondary | ICD-10-CM | POA: Insufficient documentation

## 2015-05-15 DIAGNOSIS — H538 Other visual disturbances: Secondary | ICD-10-CM | POA: Insufficient documentation

## 2015-05-15 DIAGNOSIS — M549 Dorsalgia, unspecified: Secondary | ICD-10-CM | POA: Insufficient documentation

## 2015-05-15 DIAGNOSIS — R9431 Abnormal electrocardiogram [ECG] [EKG]: Secondary | ICD-10-CM | POA: Insufficient documentation

## 2015-05-15 DIAGNOSIS — Z8719 Personal history of other diseases of the digestive system: Secondary | ICD-10-CM | POA: Insufficient documentation

## 2015-05-15 DIAGNOSIS — F17210 Nicotine dependence, cigarettes, uncomplicated: Secondary | ICD-10-CM | POA: Insufficient documentation

## 2015-05-15 LAB — COMPREHENSIVE METABOLIC PANEL
ALT: 10 U/L — ABNORMAL LOW (ref 17–63)
AST: 18 U/L (ref 15–41)
Albumin: 4.4 g/dL (ref 3.5–5.0)
Alkaline Phosphatase: 33 U/L — ABNORMAL LOW (ref 38–126)
Anion gap: 11 (ref 5–15)
BUN: 6 mg/dL (ref 6–20)
CO2: 28 mmol/L (ref 22–32)
Calcium: 10 mg/dL (ref 8.9–10.3)
Chloride: 103 mmol/L (ref 101–111)
Creatinine, Ser: 0.98 mg/dL (ref 0.61–1.24)
GFR calc Af Amer: >60 mL/min (ref >60)
GFR calc non Af Amer: >60 mL/min (ref >60)
Glucose, Bld: 92 mg/dL (ref 65–99)
Potassium: 4.4 mmol/L (ref 3.5–5.1)
Sodium: 142 mmol/L (ref 135–145)
Total Bilirubin: 1 mg/dL (ref 0.3–1.2)
Total Protein: 7.2 g/dL (ref 6.5–8.1)

## 2015-05-15 LAB — DIFFERENTIAL
Basophils Absolute: 0 10*3/uL (ref 0.0–0.1)
Basophils Relative: 1 %
Eosinophils Absolute: 0.3 10*3/uL (ref 0.0–0.7)
Eosinophils Relative: 5 %
Lymphocytes Relative: 43 %
Lymphs Abs: 2.5 10*3/uL (ref 0.7–4.0)
Monocytes Absolute: 0.4 10*3/uL (ref 0.1–1.0)
Monocytes Relative: 8 %
Neutro Abs: 2.4 10*3/uL (ref 1.7–7.7)
Neutrophils Relative %: 43 %

## 2015-05-15 LAB — CBC
HCT: 41 % (ref 39.0–52.0)
Hemoglobin: 13.3 g/dL (ref 13.0–17.0)
MCH: 30.1 pg (ref 26.0–34.0)
MCHC: 32.4 g/dL (ref 30.0–36.0)
MCV: 92.8 fL (ref 78.0–100.0)
Platelets: 233 10*3/uL (ref 150–400)
RBC: 4.42 MIL/uL (ref 4.22–5.81)
RDW: 13.1 % (ref 11.5–15.5)
WBC: 5.7 10*3/uL (ref 4.0–10.5)

## 2015-05-15 LAB — PROTIME-INR
INR: 1.04 (ref 0.00–1.49)
Prothrombin Time: 13.8 seconds (ref 11.6–15.2)

## 2015-05-15 LAB — I-STAT TROPONIN, ED
Troponin i, poc: 0.01 ng/mL (ref 0.00–0.08)
Troponin i, poc: 0 ng/mL (ref 0.00–0.08)

## 2015-05-15 LAB — APTT: aPTT: 28 seconds (ref 24–37)

## 2015-05-15 MED ORDER — MECLIZINE HCL 25 MG PO TABS
25.0000 mg | ORAL_TABLET | Freq: Once | ORAL | Status: AC
  Administered 2015-05-15: 25 mg via ORAL
  Filled 2015-05-15: qty 1

## 2015-05-15 MED ORDER — TRAMADOL HCL 50 MG PO TABS
50.0000 mg | ORAL_TABLET | Freq: Once | ORAL | Status: AC
  Administered 2015-05-15: 50 mg via ORAL
  Filled 2015-05-15: qty 1

## 2015-05-15 MED ORDER — MECLIZINE HCL 25 MG PO TABS: 25.0000 mg | ORAL_TABLET | Freq: Two times a day (BID) | ORAL | Status: DC | PRN

## 2015-05-15 MED ORDER — DIPHENHYDRAMINE HCL 50 MG/ML IJ SOLN
25.0000 mg | Freq: Once | INTRAMUSCULAR | Status: AC
  Administered 2015-05-15: 25 mg via INTRAVENOUS
  Filled 2015-05-15: qty 1

## 2015-05-15 MED ORDER — METOCLOPRAMIDE HCL 5 MG/ML IJ SOLN
10.0000 mg | Freq: Once | INTRAMUSCULAR | Status: AC
  Administered 2015-05-15: 10 mg via INTRAVENOUS
  Filled 2015-05-15: qty 2

## 2015-05-15 NOTE — ED Provider Notes (Signed)
Complains of frontal headache and right-sided paralumbar lower back pain onset 4 days ago, gradual in onset. No neck pain no chest pain no abdominal pain no shortness of breath. Associated symptoms include mild lightheadedness. He is presently asymptomatic since treatment in the emergency department.. On exam alert, Glasgow coma score 15 lungs clear auscultation heart regular rate and rhythm no murmurs neurologic Glasgow Coma Score 15 gait normal Romberg normal pronator drift normal finger-nose normal DTR symmetric bilaterally at knee jerk ankle jerk and biceps. Patient serial EKG hypertrophy are normal sinus rhythm with LVH with strain pattern. Patient has had no chest pain has negative troponin and I don't think that this in any way resembles acute coronary syndrome. Counseled patient for 5 minutes on smoking cessation. He is advised to find a primary care physician Results for orders placed or performed during the hospital encounter of 05/15/15  Protime-INR  Result Value Ref Range   Prothrombin Time 13.8 11.6 - 15.2 seconds   INR 1.04 0.00 - 1.49  APTT  Result Value Ref Range   aPTT 28 24 - 37 seconds  CBC  Result Value Ref Range   WBC 5.7 4.0 - 10.5 K/uL   RBC 4.42 4.22 - 5.81 MIL/uL   Hemoglobin 13.3 13.0 - 17.0 g/dL   HCT 16.141.0 09.639.0 - 04.552.0 %   MCV 92.8 78.0 - 100.0 fL   MCH 30.1 26.0 - 34.0 pg   MCHC 32.4 30.0 - 36.0 g/dL   RDW 40.913.1 81.111.5 - 91.415.5 %   Platelets 233 150 - 400 K/uL  Differential  Result Value Ref Range   Neutrophils Relative % 43 %   Neutro Abs 2.4 1.7 - 7.7 K/uL   Lymphocytes Relative 43 %   Lymphs Abs 2.5 0.7 - 4.0 K/uL   Monocytes Relative 8 %   Monocytes Absolute 0.4 0.1 - 1.0 K/uL   Eosinophils Relative 5 %   Eosinophils Absolute 0.3 0.0 - 0.7 K/uL   Basophils Relative 1 %   Basophils Absolute 0.0 0.0 - 0.1 K/uL  Comprehensive metabolic panel  Result Value Ref Range   Sodium 142 135 - 145 mmol/L   Potassium 4.4 3.5 - 5.1 mmol/L   Chloride 103 101 - 111  mmol/L   CO2 28 22 - 32 mmol/L   Glucose, Bld 92 65 - 99 mg/dL   BUN 6 6 - 20 mg/dL   Creatinine, Ser 7.820.98 0.61 - 1.24 mg/dL   Calcium 95.610.0 8.9 - 21.310.3 mg/dL   Total Protein 7.2 6.5 - 8.1 g/dL   Albumin 4.4 3.5 - 5.0 g/dL   AST 18 15 - 41 U/L   ALT 10 (L) 17 - 63 U/L   Alkaline Phosphatase 33 (L) 38 - 126 U/L   Total Bilirubin 1.0 0.3 - 1.2 mg/dL   GFR calc non Af Amer >60 >60 mL/min   GFR calc Af Amer >60 >60 mL/min   Anion gap 11 5 - 15  I-stat troponin, ED (not [facility])  Result Value Ref Range   Troponin i, poc 0.01 0.00 - 0.08 ng/mL   Comment 3          I-stat troponin, ED  Result Value Ref Range   Troponin i, poc 0.00 0.00 - 0.08 ng/mL   Comment 3           Ct Head Wo Contrast  05/15/2015  CLINICAL DATA:  Pt having migraine headache all over with dizziness today. EXAM: CT HEAD WITHOUT CONTRAST TECHNIQUE: Contiguous  axial images were obtained from the base of the skull through the vertex without intravenous contrast. COMPARISON:  02/09/2013 FINDINGS: There is no intra or extra-axial fluid collection or mass lesion. The basilar cisterns and ventricles have a normal appearance. There is no CT evidence for acute infarction or hemorrhage. Chronic ischemic change is noted in the left external capsule, stable in appearance. Bone windows are unremarkable. IMPRESSION: 1.  No evidence for acute intracranial abnormality. 2. Stable appearance of chronic ischemic change in the left external capsule. Electronically Signed   By: Norva Pavlov M.D.   On: 05/15/2015 17:57     Doug Sou, MD 05/15/15 [id number]

## 2015-05-15 NOTE — ED Notes (Signed)
Pt sleepy says no differnece in his headache or dizziness
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
## 2015-05-15 NOTE — ED Notes (Signed)
Pt remains monitored by blood pressure and pulse ox.  

## 2015-05-15 NOTE — Discharge Instructions (Signed)
Tension Headache A tension headache is pain, pressure, or aching that is felt over the front and sides of your head. These headaches can last from 30 minutes to several days. HOME CARE Managing Pain  Take over-the-counter and prescription medicines only as told by your doctor.  Lie down in a dark, quiet room when you have a headache.  If directed, apply ice to your head and neck area:  Put ice in a plastic bag.  Place a towel between your skin and the bag.  Leave the ice on for 20 minutes, 2-3 times per day.  Use a heating pad or a hot shower to apply heat to your head and neck area as told by your doctor. Eating and Drinking  Eat meals on a regular schedule.  Do not drink a lot of alcohol.  Do not use a lot of caffeine, or stop using caffeine. General Instructions  Keep all follow-up visits as told by your doctor. This is important.  Keep a journal to find out if certain things bring on headaches. For example, write down:  What you eat and drink.  How much sleep you get.  Any change to your diet or medicines.  Try getting a massage, or doing other things that help you to relax.  Lessen stress.  Sit up straight. Do not tighten (tense) your muscles.  Do not use tobacco products. This includes cigarettes, chewing tobacco, or e-cigarettes. If you need help quitting, ask your doctor.  Exercise regularly as told by your doctor.  Get enough sleep. This may mean 7-9 hours of sleep. GET HELP IF:  Your symptoms are not helped by medicine.  You have a headache that feels different from your usual headache.  You feel sick to your stomach (nauseous) or you throw up (vomit).  You have a fever. GET HELP RIGHT AWAY IF:  Your headache becomes very bad.  You keep throwing up.  You have a stiff neck.  You have trouble seeing.  You have trouble speaking.  You have pain in your eye or ear.  Your muscles are weak or you lose muscle control.  You lose your balance  or you have trouble walking.  You feel like you will pass out (faint) or you pass out.  You have confusion.   This information is not intended to replace advice given to you by your health care provider. Make sure you discuss any questions you have with your health care provider.   Document Released: 07/15/2009 Document Revised: 01/09/2015 Document Reviewed: 08/13/2014 Elsevier Interactive Patient Education 2016 [employer]  

## 2015-05-15 NOTE — ED Notes (Signed)
Pt reports having a migraine x 4-5 days but denies hx of same. Reports headache is causing dizziness and blurred vision. Denies sensitivity to light. Denies n/v but is having pain to entire back.

## 2015-05-15 NOTE — ED Provider Notes (Signed)
4:35 PM Handoff from outgoing resident -- pt with several days of headache and acute on chronic back pain. HA is associated with dizziness described as spinning. He is able to ambulate. No syncope.   Pt treated with reglan/benadryl/tramadol.   On re-exam, patient alert. States that HA is not improved. Requesting something to eat. As patient reports he has not eaten in 24 hrs, this may help. Will also ambulate.   Patient has new repolarization abnormality on EKG in setting of previously known LVH, unclear etiology. Pt denies h/o HTN. Will repeat EKG/troponin to ensure no dynamic changes. Will reassess.   BP 102/69 mmHg  Pulse 65  Temp(Src) 99.1 F (37.3 C) (Oral)  Resp 18  SpO2 96%   Discussed case and reviewed EKG with Dr. Jodi MourningZavitz.   5:24 PM Pending repeat trop and EKG. Pt informed of EKG findings.   Given that HA is not improved, he is complaining of significant dizziness, and this HA is nothing like previous HA according to patient, CT head ordered.    ED ECG REPORT   Date: 05/15/2015  Rate: 66  Rhythm: normal sinus rhythm  QRS Axis: normal  Intervals: normal  ST/T Wave abnormalities: nonspecific T wave changes  Conduction Disutrbances:none  Narrative Interpretation: asymmetric lateral t-waves consistent with LVH with strain pattern have somewhat resolved  Old EKG Reviewed: changes noted  I have personally reviewed the EKG tracing and agree with the computerized printout as noted.   7:24 PM Given somewhat different EKG pattern, case discussed with Dr. Ethelda ChickJacubowitz who has seen patient. Patient counseled to see PCP for establishment of PCP, management of chronic problems, eval of abnormal EKG. Patient verbalizes understanding and agrees with plan.   Patient counseled to return if they have weakness in their arms or legs, slurred speech, trouble walking or talking, confusion, trouble with their balance, or if they have any other concerns. Patient verbalizes understanding and  agrees with plan.      Renne CriglerJoshua Charlott Calvario, PA-C 05/15/15 1928  Doug SouSam Jacubowitz, MD 05/16/15 [phone]

## 2015-05-15 NOTE — ED Notes (Signed)
Pt in xray

## 2015-07-01 ENCOUNTER — Emergency Department (INDEPENDENT_AMBULATORY_CARE_PROVIDER_SITE_OTHER)
Admission: EM | Admit: 2015-07-01 | Discharge: 2015-07-01 | Disposition: A | Discharge status: Home / Self Care | Payer: Self-pay | Source: Home / Self Care | Attending: Family Medicine | Admitting: Family Medicine

## 2015-07-01 ENCOUNTER — Encounter (HOSPITAL_COMMUNITY): Payer: Self-pay | Admitting: *Deleted

## 2015-07-01 DIAGNOSIS — R0789 Other chest pain: Secondary | ICD-10-CM

## 2015-07-01 MED ORDER — TRAMADOL HCL 50 MG PO TABS: 50.0000 mg | ORAL_TABLET | Freq: Four times a day (QID) | ORAL | Status: DC | PRN

## 2015-07-01 NOTE — ED Provider Notes (Addendum)
CSN: [id number]     Arrival date & time 07/01/15  1306 History   First MD Initiated Contact with Patient 07/01/15 1429     Chief Complaint  Patient presents with  . Chest Pain   (Consider location/radiation/quality/duration/timing/severity/associated sxs/prior Treatment) Patient is a 45 y.o. male presenting with chest pain. The history is provided by the patient.  Chest Pain Pain location:  L lateral chest Pain quality: sharp   Pain radiates to:  Does not radiate Pain radiates to the back: no   Pain severity:  Mild Onset quality:  Gradual Duration:  5 days Progression:  Unchanged Chronicity:  New Context: movement   Relieved by:  None tried Worsened by:  Nothing tried Ineffective treatments:  None tried Associated symptoms: no abdominal pain, no claudication, no cough, no lower extremity edema, no nausea, no palpitations, no shortness of breath and not vomiting     Past Medical History  Diagnosis Date  . Alcohol abuse   . Anemia   . Severe major depression with psychotic features (HCC)   . Colitis    History reviewed. No pertinent past surgical history. Family History  Problem Relation Age of Onset  . Hypertension Mother   . Hypertension Father   . Hypertension Other    Social History  Substance Use Topics  . Smoking status: Current Every Day Smoker -- 0.25 packs/day    Types: Cigarettes  . Smokeless tobacco: None  . Alcohol Use: No     Comment: former  last use1 year ago    Review of Systems  Constitutional: Negative.   Respiratory: Negative.  Negative for cough and shortness of breath.   Cardiovascular: Positive for chest pain. Negative for palpitations, claudication and leg swelling.  Gastrointestinal: Negative.  Negative for nausea, vomiting and abdominal pain.  All other systems reviewed and are negative.   Allergies  Aspirin; Ibuprofen; Pork-derived products; and Sertraline  Home Medications   Prior to Admission medications   Medication Sig Start  Date End Date Taking? Authorizing Provider  amLODipine (NORVASC) 5 MG tablet Take 1 tablet (5 mg total) by mouth daily. Patient not taking: Reported on 10/17/2014 08/28/14   Lora Havens Rumley, DO  meclizine (ANTIVERT) 25 MG tablet Take 1 tablet (25 mg total) by mouth 2 (two) times daily as needed for dizziness. 05/15/15   Erasmo Downer, MD  traMADol (ULTRAM) 50 MG tablet Take 1 tablet (50 mg total) by mouth every 6 (six) hours as needed. 07/01/15   Linna Hoff, MD   Meds Ordered and Administered this Visit  Medications - No data to display  BP 141/89 mmHg  Pulse 77  Temp(Src) 98.8 F (37.1 C) (Oral)  Resp 16  SpO2 98% No data found.   Physical Exam  Constitutional: He is oriented to person, place, and time. He appears well-developed and well-nourished. No distress.  HENT:  Head: Normocephalic.  Mouth/Throat: Oropharynx is clear and moist.  Neck: Normal range of motion. Neck supple.  Cardiovascular: Normal rate, regular rhythm, normal heart sounds and intact distal pulses.   Pulmonary/Chest: Effort normal and breath sounds normal. He exhibits tenderness.  Abdominal: Soft. Bowel sounds are normal.  Lymphadenopathy:    He has no cervical adenopathy.  Neurological: He is alert and oriented to person, place, and time.  Skin: Skin is warm and dry.  Nursing note and vitals reviewed.   ED Course  Procedures (including critical care time)  Labs Review Labs Reviewed - No data to display  Imaging Review No  results found.   Visual Acuity Review  Right Eye Distance:   Left Eye Distance:   Bilateral Distance:    Right Eye Near:   Left Eye Near:    Bilateral Near:      ecg--wnl, nsr, nad.    MDM   1. Left-sided chest wall pain    Meds ordered this encounter  Medications  . traMADol (ULTRAM) 50 MG tablet    Sig: Take 1 tablet (50 mg total) by mouth every 6 (six) hours as needed.    Dispense:  15 tablet    Refill:  0       Linna Hoff, MD 07/01/15  1437  Linna Hoff, MD 07/01/15 1438  Linna Hoff, MD 07/22/15 [phone]

## 2015-07-01 NOTE — ED Notes (Signed)
Pt  Reports  Chest  Pain    X  4-5  Days

## 2015-10-10 ENCOUNTER — Encounter (HOSPITAL_COMMUNITY): Payer: Self-pay | Admitting: Emergency Medicine

## 2015-10-10 ENCOUNTER — Emergency Department (HOSPITAL_COMMUNITY)
Admission: EM | Admit: 2015-10-10 | Discharge: 2015-10-10 | Disposition: A | Discharge status: Home / Self Care | Payer: Self-pay | Attending: Emergency Medicine | Admitting: Emergency Medicine

## 2015-10-10 ENCOUNTER — Emergency Department (HOSPITAL_COMMUNITY): Payer: Self-pay

## 2015-10-10 DIAGNOSIS — F323 Major depressive disorder, single episode, severe with psychotic features: Secondary | ICD-10-CM | POA: Insufficient documentation

## 2015-10-10 DIAGNOSIS — Z79899 Other long term (current) drug therapy: Secondary | ICD-10-CM | POA: Insufficient documentation

## 2015-10-10 DIAGNOSIS — F17210 Nicotine dependence, cigarettes, uncomplicated: Secondary | ICD-10-CM | POA: Insufficient documentation

## 2015-10-10 DIAGNOSIS — M546 Pain in thoracic spine: Secondary | ICD-10-CM | POA: Insufficient documentation

## 2015-10-10 MED ORDER — ACETAMINOPHEN 325 MG PO TABS
650.0000 mg | ORAL_TABLET | Freq: Once | ORAL | Status: AC
  Administered 2015-10-10: 650 mg via ORAL
  Filled 2015-10-10: qty 2

## 2015-10-10 MED ORDER — METHOCARBAMOL 500 MG PO TABS: 500.0000 mg | ORAL_TABLET | Freq: Two times a day (BID) | ORAL | Status: DC

## 2015-10-10 NOTE — ED Notes (Signed)
PT caled from front lobby with no answer.

## 2015-10-10 NOTE — Discharge Instructions (Signed)

## 2015-10-10 NOTE — ED Notes (Signed)
Declined W/C at D/C and was escorted to lobby by RN. 

## 2015-10-10 NOTE — ED Notes (Signed)
Pt started having back pain two or three days ago-- has hx of back pain-- states normally is able to get back pain to go away-- no relief with vicodin-- (left over from other back pain episode)

## 2015-10-10 NOTE — ED Notes (Signed)
NO ANSWER IN LOBBY OR IN SUBWAITING.
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
## 2016-01-03 ENCOUNTER — Encounter (HOSPITAL_COMMUNITY): Payer: Self-pay | Admitting: Emergency Medicine

## 2016-01-03 ENCOUNTER — Emergency Department (HOSPITAL_COMMUNITY)
Admission: EM | Admit: 2016-01-03 | Discharge: 2016-01-03 | Disposition: A | Discharge status: Home / Self Care | Payer: Self-pay | Attending: Emergency Medicine | Admitting: Emergency Medicine

## 2016-01-03 DIAGNOSIS — I10 Essential (primary) hypertension: Secondary | ICD-10-CM | POA: Insufficient documentation

## 2016-01-03 DIAGNOSIS — F17210 Nicotine dependence, cigarettes, uncomplicated: Secondary | ICD-10-CM | POA: Insufficient documentation

## 2016-01-03 DIAGNOSIS — Y939 Activity, unspecified: Secondary | ICD-10-CM | POA: Insufficient documentation

## 2016-01-03 DIAGNOSIS — S21151A Open bite of right front wall of thorax without penetration into thoracic cavity, initial encounter: Secondary | ICD-10-CM | POA: Insufficient documentation

## 2016-01-03 DIAGNOSIS — Y999 Unspecified external cause status: Secondary | ICD-10-CM | POA: Insufficient documentation

## 2016-01-03 DIAGNOSIS — Z79899 Other long term (current) drug therapy: Secondary | ICD-10-CM | POA: Insufficient documentation

## 2016-01-03 DIAGNOSIS — W503XXA Accidental bite by another person, initial encounter: Secondary | ICD-10-CM | POA: Insufficient documentation

## 2016-01-03 DIAGNOSIS — Y9289 Other specified places as the place of occurrence of the external cause: Secondary | ICD-10-CM | POA: Insufficient documentation

## 2016-01-03 MED ORDER — AMOXICILLIN-POT CLAVULANATE 875-125 MG PO TABS
1.0000 | ORAL_TABLET | Freq: Once | ORAL | Status: AC
  Administered 2016-01-03: 1 via ORAL
  Filled 2016-01-03: qty 1

## 2016-01-03 MED ORDER — HYDROCODONE-ACETAMINOPHEN 5-325 MG PO TABS
1.0000 | ORAL_TABLET | Freq: Once | ORAL | Status: AC
  Administered 2016-01-03: 1 via ORAL
  Filled 2016-01-03: qty 1

## 2016-01-03 MED ORDER — AMOXICILLIN-POT CLAVULANATE 875-125 MG PO TABS: 1.0000 | ORAL_TABLET | Freq: Two times a day (BID) | ORAL | 0 refills | Status: DC

## 2016-01-03 NOTE — Discharge Instructions (Signed)
It was my pleasure taking care of you today!  Keep area clean and dry. Please take all of your antibiotics until finished!  Return to ER for worsening redness around the area, pus draining from site, new or worsening symptoms, any additional concerns.
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
## 2016-03-01 ENCOUNTER — Emergency Department (HOSPITAL_COMMUNITY)
Admission: EM | Admit: 2016-03-01 | Discharge: 2016-03-01 | Disposition: A | Discharge status: Home / Self Care | Payer: Self-pay | Attending: Emergency Medicine | Admitting: Emergency Medicine

## 2016-03-01 ENCOUNTER — Encounter (HOSPITAL_COMMUNITY): Payer: Self-pay

## 2016-03-01 ENCOUNTER — Emergency Department (HOSPITAL_COMMUNITY): Payer: Self-pay

## 2016-03-01 DIAGNOSIS — I10 Essential (primary) hypertension: Secondary | ICD-10-CM | POA: Insufficient documentation

## 2016-03-01 DIAGNOSIS — Y939 Activity, unspecified: Secondary | ICD-10-CM | POA: Insufficient documentation

## 2016-03-01 DIAGNOSIS — Z79899 Other long term (current) drug therapy: Secondary | ICD-10-CM | POA: Insufficient documentation

## 2016-03-01 DIAGNOSIS — W503XXA Accidental bite by another person, initial encounter: Secondary | ICD-10-CM

## 2016-03-01 DIAGNOSIS — S61451A Open bite of right hand, initial encounter: Secondary | ICD-10-CM | POA: Insufficient documentation

## 2016-03-01 DIAGNOSIS — Y990 Civilian activity done for income or pay: Secondary | ICD-10-CM | POA: Insufficient documentation

## 2016-03-01 DIAGNOSIS — F17210 Nicotine dependence, cigarettes, uncomplicated: Secondary | ICD-10-CM | POA: Insufficient documentation

## 2016-03-01 DIAGNOSIS — Y9289 Other specified places as the place of occurrence of the external cause: Secondary | ICD-10-CM | POA: Insufficient documentation

## 2016-03-01 MED ORDER — HYDROCODONE-ACETAMINOPHEN 5-325 MG PO TABS
2.0000 | ORAL_TABLET | Freq: Once | ORAL | Status: AC
  Administered 2016-03-01: 2 via ORAL
  Filled 2016-03-01: qty 2

## 2016-03-01 MED ORDER — AMOXICILLIN-POT CLAVULANATE 875-125 MG PO TABS: 1.0000 | ORAL_TABLET | Freq: Two times a day (BID) | ORAL | 0 refills | Status: DC

## 2016-03-01 MED ORDER — HYDROCODONE-ACETAMINOPHEN 5-325 MG PO TABS: 1.0000 | ORAL_TABLET | Freq: Four times a day (QID) | ORAL | 0 refills | Status: DC | PRN
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
## 2016-03-01 NOTE — Discharge Instructions (Signed)
Return for worsening pain, swelling, fever, or discharge.

## 2016-03-01 NOTE — ED Notes (Signed)
Patient was alert, oriented and stable upon discharge. RN went over AVS and patient had no further questions.  
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
## 2016-04-26 ENCOUNTER — Emergency Department (HOSPITAL_COMMUNITY)
Admission: EM | Admit: 2016-04-26 | Discharge: 2016-04-26 | Disposition: A | Discharge status: Home / Self Care | Payer: Self-pay | Attending: Emergency Medicine | Admitting: Emergency Medicine

## 2016-04-26 ENCOUNTER — Encounter (HOSPITAL_COMMUNITY): Payer: Self-pay | Admitting: Emergency Medicine

## 2016-04-26 DIAGNOSIS — M62830 Muscle spasm of back: Secondary | ICD-10-CM | POA: Insufficient documentation

## 2016-04-26 DIAGNOSIS — M546 Pain in thoracic spine: Secondary | ICD-10-CM

## 2016-04-26 DIAGNOSIS — M79642 Pain in left hand: Secondary | ICD-10-CM | POA: Insufficient documentation

## 2016-04-26 DIAGNOSIS — F17210 Nicotine dependence, cigarettes, uncomplicated: Secondary | ICD-10-CM | POA: Insufficient documentation

## 2016-04-26 MED ORDER — CYCLOBENZAPRINE HCL 10 MG PO TABS: 10.0000 mg | ORAL_TABLET | Freq: Two times a day (BID) | ORAL | 0 refills | Status: DC | PRN

## 2016-04-26 MED ORDER — TRAMADOL HCL 50 MG PO TABS
50.0000 mg | ORAL_TABLET | Freq: Once | ORAL | Status: AC
  Administered 2016-04-26: 50 mg via ORAL
  Filled 2016-04-26: qty 1

## 2016-04-26 MED ORDER — CYCLOBENZAPRINE HCL 10 MG PO TABS
10.0000 mg | ORAL_TABLET | Freq: Once | ORAL | Status: AC
  Administered 2016-04-26: 10 mg via ORAL
  Filled 2016-04-26: qty 1
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
## 2016-04-26 NOTE — Discharge Instructions (Signed)
Do not drive while taking the medication as it will make you sleepy.
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
## 2016-05-20 ENCOUNTER — Emergency Department (HOSPITAL_COMMUNITY)
Admission: EM | Admit: 2016-05-20 | Discharge: 2016-05-20 | Disposition: A | Discharge status: Home / Self Care | Payer: Self-pay | Attending: Emergency Medicine | Admitting: Emergency Medicine

## 2016-05-20 ENCOUNTER — Encounter (HOSPITAL_COMMUNITY): Payer: Self-pay

## 2016-05-20 DIAGNOSIS — M546 Pain in thoracic spine: Secondary | ICD-10-CM | POA: Insufficient documentation

## 2016-05-20 DIAGNOSIS — G8929 Other chronic pain: Secondary | ICD-10-CM | POA: Insufficient documentation

## 2016-05-20 DIAGNOSIS — F17210 Nicotine dependence, cigarettes, uncomplicated: Secondary | ICD-10-CM | POA: Insufficient documentation

## 2016-05-20 DIAGNOSIS — I10 Essential (primary) hypertension: Secondary | ICD-10-CM | POA: Insufficient documentation

## 2016-05-20 NOTE — ED Notes (Signed)
Pt making phone calls in room to find ride home

## 2016-05-20 NOTE — ED Triage Notes (Signed)
[facility] since 2pm

## 2016-05-20 NOTE — ED Notes (Signed)
Pt stable, ambulatory, states understanding of discharge instructions 
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
## 2016-05-26 ENCOUNTER — Encounter (HOSPITAL_COMMUNITY): Payer: Self-pay | Admitting: Emergency Medicine

## 2016-05-26 ENCOUNTER — Emergency Department (HOSPITAL_COMMUNITY)
Admission: EM | Admit: 2016-05-26 | Discharge: 2016-05-26 | Disposition: A | Discharge status: Home / Self Care | Payer: Self-pay | Attending: Emergency Medicine | Admitting: Emergency Medicine

## 2016-05-26 DIAGNOSIS — Z5321 Procedure and treatment not carried out due to patient leaving prior to being seen by health care provider: Secondary | ICD-10-CM | POA: Insufficient documentation

## 2016-05-26 DIAGNOSIS — R05 Cough: Secondary | ICD-10-CM | POA: Insufficient documentation

## 2016-05-26 LAB — CBC
HEMATOCRIT: 40 % (ref 39.0–52.0)
HEMOGLOBIN: 13.1 g/dL (ref 13.0–17.0)
MCH: 30.4 pg (ref 26.0–34.0)
MCHC: 32.8 g/dL (ref 30.0–36.0)
MCV: 92.8 fL (ref 78.0–100.0)
Platelets: 213 10*3/uL (ref 150–400)
RBC: 4.31 MIL/uL (ref 4.22–5.81)
RDW: 13.5 % (ref 11.5–15.5)
WBC: 6.5 10*3/uL (ref 4.0–10.5)

## 2016-05-26 LAB — BASIC METABOLIC PANEL
ANION GAP: 9 (ref 5–15)
BUN: 15 mg/dL (ref 6–20)
CHLORIDE: 102 mmol/L (ref 101–111)
CO2: 27 mmol/L (ref 22–32)
Calcium: 10.1 mg/dL (ref 8.9–10.3)
Creatinine, Ser: 1.09 mg/dL (ref 0.61–1.24)
GFR calc Af Amer: >60 mL/min (ref >60)
GLUCOSE: 105 mg/dL — AB (ref 65–99)
POTASSIUM: 3.9 mmol/L (ref 3.5–5.1)
SODIUM: 138 mmol/L (ref 135–145)

## 2016-05-26 LAB — CBG MONITORING, ED: Glucose-Capillary: 100 mg/dL — ABNORMAL HIGH (ref 65–99)

## 2016-05-26 NOTE — ED Triage Notes (Signed)
Pt states he has had a cough as well and back pain.

## 2016-05-26 NOTE — ED Notes (Signed)
No answer

## 2016-05-26 NOTE — ED Triage Notes (Signed)
Pt has not been feeling well for two days. Pt felt sweaty, dizzy today at work. Symptoms self resolved. Pt denies any of these symptoms for EMS. Pt denies CP, SOB. Pt here for generalized weakness, pain in right flank and back. Per EMS. BP 120/80, HR 90, sp02 96,

## 2016-05-26 NOTE — ED Notes (Signed)
Called for pt for vitals. No response.

## 2016-05-27 ENCOUNTER — Encounter (HOSPITAL_COMMUNITY): Payer: Self-pay | Admitting: Emergency Medicine

## 2016-05-27 ENCOUNTER — Emergency Department (HOSPITAL_COMMUNITY)
Admission: EM | Admit: 2016-05-27 | Discharge: 2016-05-27 | Disposition: A | Discharge status: Home / Self Care | Payer: Self-pay | Attending: Emergency Medicine | Admitting: Emergency Medicine

## 2016-05-27 ENCOUNTER — Emergency Department (HOSPITAL_COMMUNITY): Payer: Self-pay

## 2016-05-27 DIAGNOSIS — Z79899 Other long term (current) drug therapy: Secondary | ICD-10-CM | POA: Insufficient documentation

## 2016-05-27 DIAGNOSIS — I10 Essential (primary) hypertension: Secondary | ICD-10-CM | POA: Insufficient documentation

## 2016-05-27 DIAGNOSIS — J069 Acute upper respiratory infection, unspecified: Secondary | ICD-10-CM | POA: Insufficient documentation

## 2016-05-27 DIAGNOSIS — F17210 Nicotine dependence, cigarettes, uncomplicated: Secondary | ICD-10-CM | POA: Insufficient documentation

## 2016-05-27 MED ORDER — MECLIZINE HCL 25 MG PO TABS: 25.0000 mg | ORAL_TABLET | Freq: Two times a day (BID) | ORAL | 0 refills | Status: DC | PRN

## 2016-05-27 MED ORDER — BENZONATATE 100 MG PO CAPS: 100.0000 mg | ORAL_CAPSULE | Freq: Three times a day (TID) | ORAL | 0 refills | Status: DC

## 2016-05-27 MED ORDER — PROMETHAZINE-DM 6.25-15 MG/5ML PO SYRP: 5.0000 mL | ORAL_SOLUTION | Freq: Four times a day (QID) | ORAL | 0 refills | Status: DC | PRN

## 2016-05-27 NOTE — ED Provider Notes (Signed)
[facility] Provider Note   CSN: [id number] Arrival date & time: 05/27/16  0219     History   Chief Complaint Chief Complaint  Patient presents with  . Cough    Ribcage pain     HPI [name] is a 46 y.o. male.  HPI   46 year old male with history of hypertension,  depression, polysubstance abuse, chronic back pain presenting with complaints of cough. Patient reports since yesterday he has developed chills, cough productive with yellow sputum, pain to the right side of his ribs when he cough, having generalized weakness, body aches, dizzy, occasional vomit, and decrease in appetite. He did not report nasal congestion, sore throat, ear pain or sneezing. He did try some TheraFlu prior to arrival. He had not had flu or pneumonia shot recently. His wrist pain is different from his usual chronic back pain. States pain is moderate in intensity. He is a smoker. Denies any history of PE or DVT, no recent surgery, prolonged bed rest, recent travel, or leg swelling. Denies hemoptysis or active cancer.  Past Medical History:  Diagnosis Date  . Alcohol abuse   . Anemia   . Colitis   . Severe major depression with psychotic features [facility])     Patient Active Problem List   Diagnosis Date Noted  . Essential hypertension 09/03/2014  . Tobacco use disorder 09/03/2014  . Substance abuse   . Depression   . Cellulitis of nasal tip   . Chronic back pain   . Cellulitis of nose 08/26/2014  . Cellulitis 08/26/2014  . MDD (major depressive disorder), recurrent severe, without psychosis (HCC) 06/09/2014  . 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 Alcohol dependence (HCC) 06/08/2011  . Polysubstance abuse 05/31/2011    History reviewed. No pertinent surgical history.     Home Medications    Prior to Admission medications   Medication Sig Start Date End Date Taking? Authorizing Provider  amLODipine (NORVASC) 5 MG tablet Take 1  tablet (5 mg total) by mouth daily. Patient not taking: Reported on 10/17/2014 08/28/14   Lora Havens Rumley, DO  amoxicillin-clavulanate (AUGMENTIN) 875-125 MG tablet Take 1 tablet by mouth every 12 (twelve) hours. 03/01/16   Roxy Horseman, PA-C  cyclobenzaprine (FLEXERIL) 10 MG tablet Take 1 tablet (10 mg total) by mouth 2 (two) times daily as needed for muscle spasms. 04/26/16   Hope Orlene Och, NP  HYDROcodone-acetaminophen (NORCO/VICODIN) 5-325 MG tablet Take 1-2 tablets by mouth every 6 (six) hours as needed. 03/01/16   Roxy Horseman, PA-C  meclizine (ANTIVERT) 25 MG tablet Take 1 tablet (25 mg total) by mouth 2 (two) times daily as needed for dizziness. 05/15/15   Erasmo Downer, MD  methocarbamol (ROBAXIN) 500 MG tablet Take 1 tablet (500 mg total) by mouth 2 (two) times daily. 10/10/15   Stevi Barrett, PA-C  traMADol (ULTRAM) 50 MG tablet Take 1 tablet (50 mg total) by mouth every 6 (six) hours as needed. 07/01/15   Linna Hoff, MD    Family History Family History  Problem Relation Age of Onset  . Hypertension Mother   . Hypertension Father   . Hypertension Other     Social History Social History  Substance Use Topics  . Smoking status: Current Every Day Smoker    Packs/day: 0.25    Types: Cigarettes  . Smokeless tobacco: Never Used  . Alcohol use No     Comment: former  last use1 year ago  Allergies   Aspirin; Ibuprofen; Pork-derived products; and Sertraline   Review of Systems Review of Systems  All other systems reviewed and are negative.    Physical Exam Updated Vital Signs BP 117/68 (BP Location: Left Arm)   Pulse 71   Temp 99 F (37.2 C) (Oral)   Resp 20   SpO2 100%   Physical Exam  Constitutional: He appears well-developed and well-nourished. No distress.  HENT:  Head: Atraumatic.  Right Ear: External ear normal.  Left Ear: External ear normal.  Nose: Nose normal.  Mouth/Throat: Oropharynx is clear and moist.  Eyes: Conjunctivae are normal.    Neck: Normal range of motion. Neck supple.  Cardiovascular: Normal rate, regular rhythm and intact distal pulses.   Pulmonary/Chest: Effort normal and breath sounds normal. No respiratory distress. He has no wheezes. He exhibits no tenderness.  Abdominal: Soft. There is no tenderness.  Musculoskeletal: He exhibits no edema.  Neurological: He is alert.  Skin: No rash noted.  Psychiatric: He has a normal mood and affect.  Nursing note and vitals reviewed.    ED Treatments / Results  Labs (all labs ordered are listed, but only abnormal results are displayed) Labs Reviewed - No data to display  EKG  EKG Interpretation None       Radiology Dg Ribs Unilateral W/chest Right  Result Date: 05/27/2016 CLINICAL DATA:  Right lower posterior rib pain without injury. Patient has been coughing. EXAM: RIGHT RIBS AND CHEST - 3+ VIEW COMPARISON:  None. FINDINGS: No fracture or other bone lesions are seen involving the ribs. There is no evidence of pneumothorax or pleural effusion. Both lungs are clear. Heart size and mediastinal contours are within normal limits. IMPRESSION: Negative. Electronically Signed   By: Tollie Ethavid  Kwon M.D.   On: 05/27/2016 03:11    Procedures Procedures (including critical care time)  Medications Ordered in ED Medications - No data to display   Initial Impression / Assessment and Plan / ED Course  I have reviewed the triage vital signs and the nursing notes.  Pertinent labs & imaging results that were available during my care of the patient were reviewed by me and considered in my medical decision making (see chart for details).     BP 117/68 (BP Location: Left Arm)   Pulse 71   Temp 99 F (37.2 C) (Oral)   Resp 20   SpO2 100%    Final Clinical Impressions(s) / ED Diagnoses   Final diagnoses:  Acute upper respiratory infection    New Prescriptions New Prescriptions   BENZONATATE (TESSALON) 100 MG CAPSULE    Take 1 capsule (100 mg total) by mouth  every 8 (eight) hours.   PROMETHAZINE-DEXTROMETHORPHAN (PROMETHAZINE-DM) 6.25-15 MG/5ML SYRUP    Take 5 mLs by mouth 4 (four) times daily as needed for cough.   7:07 AM Patient here with cough, chest pain, and other symptoms suggestive of a viral respiratory infection. X-rays of his chest and right ribs without any acute finding. He is PERC negative, doubt PE. Symptom is not suggestive of ACS. Will try symptomatic treatment and return precaution given.     Fayrene HelperBowie Lelania Bia, PA-C 05/27/16 [id number]    Gerhard Munchobert Lockwood, MD 05/27/16 1056

## 2016-05-27 NOTE — ED Triage Notes (Signed)
Patient reports persistent productive cough with right lateral ribcage pain onset this week , respirations unlabored. No fever or chills .

## 2016-08-22 ENCOUNTER — Encounter (HOSPITAL_COMMUNITY): Payer: Self-pay

## 2016-08-22 ENCOUNTER — Emergency Department (HOSPITAL_COMMUNITY)
Admission: EM | Admit: 2016-08-22 | Discharge: 2016-08-22 | Disposition: A | Discharge status: Home / Self Care | Payer: Self-pay | Attending: Emergency Medicine | Admitting: Emergency Medicine

## 2016-08-22 DIAGNOSIS — F17210 Nicotine dependence, cigarettes, uncomplicated: Secondary | ICD-10-CM | POA: Insufficient documentation

## 2016-08-22 DIAGNOSIS — X58XXXA Exposure to other specified factors, initial encounter: Secondary | ICD-10-CM | POA: Insufficient documentation

## 2016-08-22 DIAGNOSIS — T148XXA Other injury of unspecified body region, initial encounter: Secondary | ICD-10-CM

## 2016-08-22 DIAGNOSIS — I10 Essential (primary) hypertension: Secondary | ICD-10-CM | POA: Insufficient documentation

## 2016-08-22 DIAGNOSIS — S39012A Strain of muscle, fascia and tendon of lower back, initial encounter: Secondary | ICD-10-CM | POA: Insufficient documentation

## 2016-08-22 DIAGNOSIS — Y939 Activity, unspecified: Secondary | ICD-10-CM | POA: Insufficient documentation

## 2016-08-22 DIAGNOSIS — Y999 Unspecified external cause status: Secondary | ICD-10-CM | POA: Insufficient documentation

## 2016-08-22 DIAGNOSIS — Y929 Unspecified place or not applicable: Secondary | ICD-10-CM | POA: Insufficient documentation

## 2016-08-22 MED ORDER — CYCLOBENZAPRINE HCL 10 MG PO TABS: 5.0000 mg | ORAL_TABLET | Freq: Three times a day (TID) | ORAL | 0 refills | Status: AC

## 2016-08-22 MED ORDER — ACETAMINOPHEN 500 MG PO TABS
500.0000 mg | ORAL_TABLET | Freq: Once | ORAL | Status: AC
  Administered 2016-08-22: 500 mg via ORAL
  Filled 2016-08-22: qty 1

## 2016-08-22 MED ORDER — CYCLOBENZAPRINE HCL 10 MG PO TABS
5.0000 mg | ORAL_TABLET | Freq: Once | ORAL | Status: AC
  Administered 2016-08-22: 5 mg via ORAL
  Filled 2016-08-22: qty 1

## 2016-08-22 NOTE — ED Triage Notes (Signed)
Patient complains of side pain in thoracic area. Denies injury but complains of pain x 2 days with any movement, no cold or cough, NAD

## 2016-08-22 NOTE — ED Provider Notes (Signed)
[facility] Provider Note   CSN: [id number] Arrival date & time: 08/22/16  1525     History   Chief Complaint No chief complaint on file.   HPI [name] is a 46 y.o. male.  The history is provided by the patient and medical records.  Back Pain   This is a new problem. The current episode started more than 2 days ago. The problem occurs constantly. The problem has been gradually worsening. The pain is associated with no known injury. Pain location: right lumbar parapsinal muscles/right flank muscles. The pain does not radiate. The pain is severe. The symptoms are aggravated by bending and certain positions (walking, palpation). The pain is the same all the time. Pertinent negatives include no chest pain, no fever, no numbness, no headaches, no abdominal pain, no bowel incontinence, no bladder incontinence, no dysuria, no leg pain, no paresthesias and no weakness. He has tried nothing for the symptoms.    Past Medical History:  Diagnosis Date  . Alcohol abuse   . Anemia   . Colitis   . Severe major depression with psychotic features [facility])     Patient Active Problem List   Diagnosis Date Noted  . Essential hypertension 09/03/2014  . Tobacco use disorder 09/03/2014  . Substance abuse   . Depression   . Cellulitis of nasal tip   . Chronic back pain   . Cellulitis of nose 08/26/2014  . Cellulitis 08/26/2014  . MDD (major depressive disorder), recurrent severe, without psychosis (HCC) 06/09/2014  . 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 Alcohol dependence (HCC) 06/08/2011  . Polysubstance abuse 05/31/2011    History reviewed. No pertinent surgical history.     Home Medications    Prior to Admission medications   Medication Sig Start Date End Date Taking? Authorizing Provider  amLODipine (NORVASC) 5 MG tablet Take 1 tablet (5 mg total) by mouth daily. Patient not taking: Reported on 10/17/2014 08/28/14    Lora Havens Rumley, DO  amoxicillin-clavulanate (AUGMENTIN) 875-125 MG tablet Take 1 tablet by mouth every 12 (twelve) hours. 03/01/16   Roxy Horseman, PA-C  benzonatate (TESSALON) 100 MG capsule Take 1 capsule (100 mg total) by mouth every 8 (eight) hours. 05/27/16   Fayrene Helper, PA-C  cyclobenzaprine (FLEXERIL) 10 MG tablet Take 1 tablet (10 mg total) by mouth 2 (two) times daily as needed for muscle spasms. 04/26/16   Hope Orlene Och, NP  meclizine (ANTIVERT) 25 MG tablet Take 1 tablet (25 mg total) by mouth 2 (two) times daily as needed for dizziness. 05/27/16   Fayrene Helper, PA-C  promethazine-dextromethorphan (PROMETHAZINE-DM) 6.25-15 MG/5ML syrup Take 5 mLs by mouth 4 (four) times daily as needed for cough. 05/27/16   Fayrene Helper, PA-C    Family History Family History  Problem Relation Age of Onset  . Hypertension Mother   . Hypertension Father   . Hypertension Other     Social History Social History  Substance Use Topics  . Smoking status: Current Every Day Smoker    Packs/day: 0.25    Types: Cigarettes  . Smokeless tobacco: Never Used  . Alcohol use No     Comment: former  last use1 year ago     Allergies   Aspirin; Ibuprofen; Pork-derived products; and Sertraline   Review of Systems Review of Systems  Constitutional: Negative for chills and fever.  HENT: Negative for ear pain and sore throat.   Eyes: Negative for pain and visual disturbance.  Respiratory:  Negative for cough and shortness of breath.   Cardiovascular: Negative for chest pain and palpitations.  Gastrointestinal: Negative for abdominal pain, bowel incontinence and vomiting.  Genitourinary: Negative for bladder incontinence, dysuria and hematuria.  Musculoskeletal: Positive for back pain. Negative for arthralgias.  Skin: Negative for color change and rash.  Neurological: Negative for seizures, syncope, weakness, numbness, headaches and paresthesias.  All other systems reviewed and are negative.    Physical  Exam Updated Vital Signs BP 120/68   Pulse 73   Temp 98.9 F (37.2 C) (Oral)   Resp 18   SpO2 100%   Physical Exam  Constitutional: He is oriented to person, place, and time. He appears well-developed and well-nourished.  Thin  HENT:  Head: Normocephalic and atraumatic.  Eyes: Conjunctivae are normal.  Neck: Neck supple.  Cardiovascular: Normal rate and regular rhythm.   No murmur heard. Pulmonary/Chest: Effort normal and breath sounds normal. No respiratory distress.  Abdominal: Soft. There is no tenderness.  Musculoskeletal: He exhibits no edema.  TTP in specific area of the right lumbar paraspinal muscles/right flank musculature  Neurological: He is alert and oriented to person, place, and time.  5/5 strength in the b/l LE, no focal sensory deficits  Skin: Skin is warm and dry.  Psychiatric: He has a normal mood and affect.  Nursing note and vitals reviewed.  ED Treatments / Results  Labs (all labs ordered are listed, but only abnormal results are displayed) Labs Reviewed - No data to display  EKG  EKG Interpretation None       Radiology No results found.  Procedures Procedures (including critical care time)  Medications Ordered in ED Medications - No data to display   Initial Impression / Assessment and Plan / ED Course  I have reviewed the triage vital signs and the nursing notes.  Pertinent labs & imaging results that were available during my care of the patient were reviewed by me and considered in my medical decision making (see chart for details).    Pt with h/o chronic back pain presents with new back pain. Symptoms started 3days ago & have worsened; says it was so bad this morning that he had difficulty getting out of bed. Denies any trauma to the area, F/C, cough, CP, SOB, N/V/D, urinary symptoms, weight loss, numbness/tingling, weakness, recent illness, or heavy lifting.  VS & exam as above. POCUS renal scan w/o evidence of hydronephrosis. No  "red flag" symptoms associated with his pain. Pt likely suffering from a muscle strain. Motrin & Flexeril given in the ED.  Explained all results to the Pt. Will discharge the Pt home with prescription for Flexeril. Recommending follow-up with PCP. ED return precautions provided. Pt acknowledged understanding of, and concurrence with the plan. All questions answered to his satisfaction. In stable condition at the time of discharge.  Final Clinical Impressions(s) / ED Diagnoses   Final diagnoses:  Muscle strain    New Prescriptions Discharge Medication List as of 08/22/2016  6:19 PM       Forest Becker, MD 08/23/16 0050    Blane Ohara, MD 08/23/16 2358

## 2016-10-08 ENCOUNTER — Encounter (HOSPITAL_COMMUNITY): Payer: Self-pay | Admitting: Emergency Medicine

## 2016-10-08 ENCOUNTER — Emergency Department (HOSPITAL_COMMUNITY): Payer: Self-pay

## 2016-10-08 ENCOUNTER — Emergency Department (HOSPITAL_COMMUNITY)
Admission: EM | Admit: 2016-10-08 | Discharge: 2016-10-08 | Discharge status: Against Medical Advice | Payer: Self-pay | Attending: Emergency Medicine | Admitting: Emergency Medicine

## 2016-10-08 DIAGNOSIS — F17210 Nicotine dependence, cigarettes, uncomplicated: Secondary | ICD-10-CM | POA: Insufficient documentation

## 2016-10-08 DIAGNOSIS — I10 Essential (primary) hypertension: Secondary | ICD-10-CM | POA: Insufficient documentation

## 2016-10-08 DIAGNOSIS — Z5320 Procedure and treatment not carried out because of patient's decision for unspecified reasons: Secondary | ICD-10-CM

## 2016-10-08 DIAGNOSIS — Z5329 Procedure and treatment not carried out because of patient's decision for other reasons: Secondary | ICD-10-CM

## 2016-10-08 DIAGNOSIS — Z886 Allergy status to analgesic agent status: Secondary | ICD-10-CM | POA: Insufficient documentation

## 2016-10-08 DIAGNOSIS — R079 Chest pain, unspecified: Secondary | ICD-10-CM | POA: Insufficient documentation

## 2016-10-08 LAB — COMPREHENSIVE METABOLIC PANEL
ALK PHOS: 33 U/L — AB (ref 38–126)
ALT: 12 U/L — ABNORMAL LOW (ref 17–63)
AST: 21 U/L (ref 15–41)
Albumin: 4.2 g/dL (ref 3.5–5.0)
Anion gap: 5 (ref 5–15)
BILIRUBIN TOTAL: 0.9 mg/dL (ref 0.3–1.2)
BUN: 8 mg/dL (ref 6–20)
CALCIUM: 9.6 mg/dL (ref 8.9–10.3)
CO2: 30 mmol/L (ref 22–32)
Chloride: 105 mmol/L (ref 101–111)
Creatinine, Ser: 1 mg/dL (ref 0.61–1.24)
GFR calc Af Amer: >60 mL/min (ref >60)
Glucose, Bld: 70 mg/dL (ref 65–99)
POTASSIUM: 3.4 mmol/L — AB (ref 3.5–5.1)
Sodium: 140 mmol/L (ref 135–145)
TOTAL PROTEIN: 6.7 g/dL (ref 6.5–8.1)

## 2016-10-08 LAB — URINALYSIS, ROUTINE W REFLEX MICROSCOPIC
BILIRUBIN URINE: NEGATIVE
Glucose, UA: NEGATIVE mg/dL
HGB URINE DIPSTICK: NEGATIVE
KETONES UR: NEGATIVE mg/dL
Leukocytes, UA: NEGATIVE
Nitrite: NEGATIVE
PROTEIN: NEGATIVE mg/dL
SPECIFIC GRAVITY, URINE: 1.005 (ref 1.005–1.030)
pH: 5 (ref 5.0–8.0)

## 2016-10-08 LAB — CBC WITH DIFFERENTIAL/PLATELET
BASOS ABS: 0 10*3/uL (ref 0.0–0.1)
BASOS PCT: 1 %
EOS PCT: 3 %
Eosinophils Absolute: 0.2 10*3/uL (ref 0.0–0.7)
HCT: 36.9 % — ABNORMAL LOW (ref 39.0–52.0)
Hemoglobin: 12 g/dL — ABNORMAL LOW (ref 13.0–17.0)
LYMPHS PCT: 28 %
Lymphs Abs: 1.8 10*3/uL (ref 0.7–4.0)
MCH: 30.3 pg (ref 26.0–34.0)
MCHC: 32.5 g/dL (ref 30.0–36.0)
MCV: 93.2 fL (ref 78.0–100.0)
Monocytes Absolute: 0.5 10*3/uL (ref 0.1–1.0)
Monocytes Relative: 8 %
NEUTROS ABS: 4 10*3/uL (ref 1.7–7.7)
Neutrophils Relative %: 60 %
PLATELETS: 221 10*3/uL (ref 150–400)
RBC: 3.96 MIL/uL — ABNORMAL LOW (ref 4.22–5.81)
RDW: 13.6 % (ref 11.5–15.5)
WBC: 6.5 10*3/uL (ref 4.0–10.5)

## 2016-10-08 LAB — I-STAT TROPONIN, ED: Troponin i, poc: 0.01 ng/mL (ref 0.00–0.08)

## 2016-10-08 MED ORDER — HYDROCODONE-ACETAMINOPHEN 5-325 MG PO TABS
1.0000 | ORAL_TABLET | Freq: Once | ORAL | Status: AC
  Administered 2016-10-08: 1 via ORAL
  Filled 2016-10-08: qty 1

## 2016-10-08 NOTE — ED Provider Notes (Signed)
[facility]-EMERGENCY DEPT Provider Note   CSN: [id number] Arrival date & time: 10/08/16  1428     History   Chief Complaint Chief Complaint  Patient presents with  . Chest Pain    HPI [name] is a 46 y.o. male he reports that today he was walking home from [facility] when he began experiencing left-sided chest pain and right-sided flank pain. He says that this pain is currently 10 out of 10, feels sharp, does not radiate. His pain is not changed with respirations. He does not have a history of similar pains. He reports marijuana use, however denies recent alcohol cocaine, or amphetamine/stimulant use.Marland Kitchen  He denies nausea, vomiting, diarrhea, headache. He reports prior to this event he was well.  He denies shortness of breath, abdominal pain.  HPI  Past Medical History:  Diagnosis Date  . Alcohol abuse   . Anemia   . Colitis   . Severe major depression with psychotic features [facility])     Patient Active Problem List   Diagnosis Date Noted  . Essential hypertension 09/03/2014  . Tobacco use disorder 09/03/2014  . Substance abuse   . Depression   . Cellulitis of nasal tip   . Chronic back pain   . Cellulitis of nose 08/26/2014  . Cellulitis 08/26/2014  . MDD (major depressive disorder), recurrent severe, without psychosis (HCC) 06/09/2014  . 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 Alcohol dependence (HCC) 06/08/2011  . Polysubstance abuse 05/31/2011    History reviewed. No pertinent surgical history.     Home Medications    Prior to Admission medications   Medication Sig Start Date End Date Taking? Authorizing Provider  amLODipine (NORVASC) 5 MG tablet Take 1 tablet (5 mg total) by mouth daily. Patient not taking: Reported on 10/17/2014 08/28/14   Araceli Bouche, DO  amoxicillin-clavulanate (AUGMENTIN) 875-125 MG tablet Take 1 tablet by mouth every 12 (twelve) hours. Patient not taking: Reported on 10/08/2016 03/01/16    Roxy Horseman, PA-C  benzonatate (TESSALON) 100 MG capsule Take 1 capsule (100 mg total) by mouth every 8 (eight) hours. Patient not taking: Reported on 10/08/2016 05/27/16   Fayrene Helper, PA-C  meclizine (ANTIVERT) 25 MG tablet Take 1 tablet (25 mg total) by mouth 2 (two) times daily as needed for dizziness. Patient not taking: Reported on 10/08/2016 05/27/16   Fayrene Helper, PA-C  promethazine-dextromethorphan (PROMETHAZINE-DM) 6.25-15 MG/5ML syrup Take 5 mLs by mouth 4 (four) times daily as needed for cough. Patient not taking: Reported on 10/08/2016 05/27/16   Fayrene Helper, PA-C    Family History Family History  Problem Relation Age of Onset  . Hypertension Mother   . Hypertension Father   . Hypertension Other     Social History Social History  Substance Use Topics  . Smoking status: Current Every Day Smoker    Packs/day: 0.25    Types: Cigarettes  . Smokeless tobacco: Never Used  . Alcohol use No     Comment: former  last use1 year ago     Allergies   Acetaminophen; Aspirin; Ibuprofen; Pork-derived products; and Sertraline   Review of Systems Review of Systems  Constitutional: Negative for chills and fever.  HENT: Negative for ear pain and sore throat.   Eyes: Negative for pain and visual disturbance.  Respiratory: Negative for cough, chest tightness, shortness of breath and stridor.   Cardiovascular: Positive for chest pain. Negative for palpitations and leg swelling.  Gastrointestinal: Negative for abdominal pain and vomiting.  Genitourinary: Negative for difficulty urinating, dysuria, flank pain and hematuria.  Musculoskeletal: Positive for back pain. Negative for arthralgias, gait problem, neck pain and neck stiffness.  Skin: Negative for color change and rash.  Neurological: Negative for dizziness, seizures, syncope and headaches.  All other systems reviewed and are negative.    Physical Exam Updated Vital Signs BP 129/84   Pulse 63   Temp 98.2 F (36.8 C) (Oral)    Resp (!) 22   Wt 68 kg (150 lb)   SpO2 98%   BMI 23.49 kg/m   Physical Exam  Constitutional: He appears well-developed and well-nourished. No distress.  HENT:  Head: Normocephalic and atraumatic.  Nose: Nose normal.  Mouth/Throat: Oropharynx is clear and moist. No oropharyngeal exudate.  Eyes: Conjunctivae and EOM are normal. Pupils are equal, round, and reactive to light. Right eye exhibits no discharge. Left eye exhibits no discharge. No scleral icterus.  Neck: Normal range of motion. Neck supple. No tracheal deviation present.  Cardiovascular: Normal rate, regular rhythm, S1 normal, S2 normal, normal heart sounds, intact distal pulses and normal pulses.  Exam reveals no gallop and no friction rub.   No murmur heard. Pulmonary/Chest: Effort normal and breath sounds normal. No respiratory distress. He has no wheezes. He exhibits no tenderness.  Abdominal: Soft. Bowel sounds are normal. He exhibits no distension. There is no tenderness. There is no guarding.  Musculoskeletal: Normal range of motion. He exhibits no edema, tenderness or deformity.  Back, neck, and flanks are non tender to palpation.  No CVA tenderness.   Lymphadenopathy:    He has no cervical adenopathy.  Neurological: He is alert. He exhibits normal muscle tone.  Skin: Skin is warm and dry. He is not diaphoretic.  Psychiatric: He has a normal mood and affect. His behavior is normal.  Nursing note and vitals reviewed.    ED Treatments / Results  Labs (all labs ordered are listed, but only abnormal results are displayed) Labs Reviewed  COMPREHENSIVE METABOLIC PANEL - Abnormal; Notable for the following:       Result Value   Potassium 3.4 (*)    ALT 12 (*)    Alkaline Phosphatase 33 (*)    All other components within normal limits  CBC WITH DIFFERENTIAL/PLATELET - Abnormal; Notable for the following:    RBC 3.96 (*)    Hemoglobin 12.0 (*)    HCT 36.9 (*)    All other components within normal limits    URINALYSIS, ROUTINE W REFLEX MICROSCOPIC  I-STAT TROPOININ, ED    EKG  EKG Interpretation  Date/Time:  Thursday October 08 2016 14:36:17 EDT Ventricular Rate:  68 PR Interval:    QRS Duration: 82 QT Interval:  415 QTC Calculation: 442 R Axis:   79 Text Interpretation:  Sinus rhythm Left ventricular hypertrophy No significant change since last tracing Confirmed by Lincoln Brighamees, Liz [phone]([id number]) on 10/08/2016 2:44:27 PM       Radiology Dg Chest 2 View  Result Date: 10/08/2016 CLINICAL DATA:  Right-sided chest pain for 1 day. EXAM: CHEST  2 VIEW COMPARISON:  05/27/2016 FINDINGS: The heart size and mediastinal contours are within normal limits. Both lungs are clear. The visualized skeletal structures are unremarkable. IMPRESSION: Negative.  No active cardiopulmonary disease. Electronically Signed   By: Myles RosenthalJohn  Stahl M.D.   On: 10/08/2016 15:41    Procedures Procedures (including critical care time)  Medications Ordered in ED Medications  HYDROcodone-acetaminophen (NORCO/VICODIN) 5-325 MG per tablet 1 tablet (1 tablet Oral Given 10/08/16 1645)  Initial Impression / Assessment and Plan / ED Course  I have reviewed the triage vital signs and the nursing notes.  Pertinent labs & imaging results that were available during my care of the patient were reviewed by me and considered in my medical decision making (see chart for details).  Clinical Course as of Oct 09 1647  Thu Oct 08, 2016  1608 Contacted ED minilab.  Initial trop negative.   [EH]  1624 I spoke with patient.  He is requesting pain medication and wishes to leave AMA.  He Stated he understands that leaving at this point and his workup we are not able to complete her workup and that he is at risk for complications ranging from mild to severe disability and/or death. He stated his understanding.  He requested pain medications.  Due to allergies limited in choices.  He was offered norco, he reports acetaminophen gives him nausea/stomach upset.  He understands that this medication contains acetaminophen and he states he still wishes to have it.  [EH]    Clinical Course User Index [EH] Cristina Gong, PA-C   Patient with chest pain who left AMA prior to work up completion.  We discussed the nature and purpose, risks and benefits, as well as, the alternatives of treatment. Time was given to allow the opportunity to ask questions and consider their options, and after the discussion, the patient decided to refuse the offerred treatment. The patient was informed that refusal could lead to, but was not limited to, death, permanent disability, or severe pain.Prior to refusing, I determined that the patient had the capacity to make their decision and understood the consequences of that decision. After refusal, I made every reasonable opportunity to treat them to the best of my ability.  The patient was notified that they may return to the emergency department at any time for further treatment and evaluation.      Final Clinical Impressions(s) / ED Diagnoses   Final diagnoses:  Chest pain, unspecified type  Left against medical advice    New Prescriptions New Prescriptions   No medications on file     Norman Clay 10/08/16 2049    Tilden Fossa, MD 10/09/16 [phone]

## 2016-10-08 NOTE — ED Triage Notes (Signed)
Arrived via EMS patient walking to work and developed chest pain radiating to back. History of chronic back pain. Pain 10/10 achy. Alert answering and following commands appropriate.

## 2016-10-08 NOTE — ED Notes (Signed)
Pt verbalized understanding discharge instructions and denies any further needs or questions at this time. VS stable, ambulatory and steady gait.    Pt is leaving AMA, pt is A&Ox4 and of sound mind.  Pt has been made aware of the risks by PA and RN and still wishes to leave. Pt is requesting a work note and prescription but does not want to stay to finishing examination.

## 2016-10-08 NOTE — ED Notes (Signed)
Got patient undress on the monitor did ekg shown to Dr Pecola Leisureeese patient is resting

## 2016-10-08 NOTE — Discharge Instructions (Signed)
You have elected to leave AGAINST MEDICAL ADVICE. He stated understanding that doing so may result in complications including mild, moderate or severe disability or death.    Please return to the emergency department if your symptoms worsen or do not improve.    Today you received medications that may make you sleepy or impair your ability to make decisions.  For the next 24 hours please do not drive, operate heavy machinery, care for a small child with out another adult present, or perform any activities that may cause harm to you or someone else if you were to fall asleep or be impaired.

## 2017-03-08 ENCOUNTER — Encounter (HOSPITAL_COMMUNITY): Payer: Self-pay

## 2017-03-08 ENCOUNTER — Emergency Department (HOSPITAL_COMMUNITY): Payer: Self-pay

## 2017-03-08 ENCOUNTER — Emergency Department (HOSPITAL_COMMUNITY)
Admission: EM | Admit: 2017-03-08 | Discharge: 2017-03-09 | Disposition: A | Discharge status: Home / Self Care | Payer: Self-pay | Attending: Emergency Medicine | Admitting: Emergency Medicine

## 2017-03-08 DIAGNOSIS — R079 Chest pain, unspecified: Secondary | ICD-10-CM | POA: Insufficient documentation

## 2017-03-08 DIAGNOSIS — F17210 Nicotine dependence, cigarettes, uncomplicated: Secondary | ICD-10-CM | POA: Insufficient documentation

## 2017-03-08 DIAGNOSIS — I10 Essential (primary) hypertension: Secondary | ICD-10-CM | POA: Insufficient documentation

## 2017-03-08 LAB — CBC
HEMATOCRIT: 34 % — AB (ref 39.0–52.0)
Hemoglobin: 10.9 g/dL — ABNORMAL LOW (ref 13.0–17.0)
MCH: 30.2 pg (ref 26.0–34.0)
MCHC: 32.1 g/dL (ref 30.0–36.0)
MCV: 94.2 fL (ref 78.0–100.0)
Platelets: 199 10*3/uL (ref 150–400)
RBC: 3.61 MIL/uL — ABNORMAL LOW (ref 4.22–5.81)
RDW: 13.6 % (ref 11.5–15.5)
WBC: 7.8 10*3/uL (ref 4.0–10.5)

## 2017-03-08 LAB — BASIC METABOLIC PANEL
Anion gap: 7 (ref 5–15)
BUN: 11 mg/dL (ref 6–20)
CHLORIDE: 108 mmol/L (ref 101–111)
CO2: 25 mmol/L (ref 22–32)
Calcium: 8.9 mg/dL (ref 8.9–10.3)
Creatinine, Ser: 0.89 mg/dL (ref 0.61–1.24)
GFR calc Af Amer: >60 mL/min (ref >60)
GFR calc non Af Amer: >60 mL/min (ref >60)
GLUCOSE: 100 mg/dL — AB (ref 65–99)
POTASSIUM: 3.8 mmol/L (ref 3.5–5.1)
Sodium: 140 mmol/L (ref 135–145)

## 2017-03-08 LAB — I-STAT TROPONIN, ED: Troponin i, poc: 0 ng/mL (ref 0.00–0.08)

## 2017-03-08 MED ORDER — LIDOCAINE 5 % EX PTCH: 1.0000 | MEDICATED_PATCH | CUTANEOUS | 0 refills | Status: DC

## 2017-03-08 MED ORDER — LIDOCAINE 5 % EX PTCH
1.0000 | MEDICATED_PATCH | CUTANEOUS | Status: DC
  Administered 2017-03-08: 1 via TRANSDERMAL
  Filled 2017-03-08: qty 1

## 2017-03-08 NOTE — ED Provider Notes (Signed)
[facility] EMERGENCY DEPARTMENT Provider Note   CSN: [id number] Arrival date & time: 03/08/17  2120     History   Chief Complaint Chief Complaint  Patient presents with  . Chest Pain    HPI [name] is a 46 y.o. male.  46 year old male with a history of alcohol abuse, severe depression with psychotic features, and anemia presents to the emergency department for complaints of chest pain.  He reports intermittent chest pain over the past 2 days.  He states that pain is aggravated when he walks, though he has had no associated shortness of breath, diaphoresis, nausea, vomiting, syncope.  He denies any radiation of his pain or any trauma or injury.  He states that he works for a [employer] and is frequently performing heavy lifting.  He has not taken any medications for his symptoms.  Patient transported by EMS and given nitroglycerin in route with no improvement to his chest pain.  He is a daily smoker, but denies illicit drug use; specifically cocaine use.  No history of IV drug use, hypertension, diabetes, dyslipidemia. No known FHx of CAD or ACS.      Past Medical History:  Diagnosis Date  . Alcohol abuse   . Anemia   . Colitis   . Severe major depression with psychotic features [facility])     Patient Active Problem List   Diagnosis Date Noted  . Essential hypertension 09/03/2014  . Tobacco use disorder 09/03/2014  . Substance abuse (HCC)   . Depression   . Cellulitis of nasal tip   . Chronic back pain   . Cellulitis of nose 08/26/2014  . Cellulitis 08/26/2014  . MDD (major depressive disorder), recurrent severe, without psychosis (HCC) 06/09/2014  . 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 Alcohol dependence (HCC) 06/08/2011  . Polysubstance abuse (HCC) 05/31/2011    History reviewed. No pertinent surgical history.     Home Medications    Prior to Admission medications   Medication Sig  Start Date End Date Taking? Authorizing Provider  amLODipine (NORVASC) 5 MG tablet Take 1 tablet (5 mg total) by mouth daily. Patient not taking: Reported on 03/08/2017 08/28/14   Araceli Bouche, DO  amoxicillin-clavulanate (AUGMENTIN) 875-125 MG tablet Take 1 tablet by mouth every 12 (twelve) hours. Patient not taking: Reported on 03/08/2017 03/01/16   Roxy Horseman, PA-C  benzonatate (TESSALON) 100 MG capsule Take 1 capsule (100 mg total) by mouth every 8 (eight) hours. Patient not taking: Reported on 03/08/2017 05/27/16   Fayrene Helper, PA-C  lidocaine (LIDODERM) 5 % Place 1 patch daily onto the skin. Remove & Discard patch within 12 hours or as directed by MD 03/08/17   Antony Madura, PA-C  meclizine (ANTIVERT) 25 MG tablet Take 1 tablet (25 mg total) by mouth 2 (two) times daily as needed for dizziness. Patient not taking: Reported on 03/08/2017 05/27/16   Fayrene Helper, PA-C  promethazine-dextromethorphan (PROMETHAZINE-DM) 6.25-15 MG/5ML syrup Take 5 mLs by mouth 4 (four) times daily as needed for cough. Patient not taking: Reported on 03/08/2017 05/27/16   Fayrene Helper, PA-C    Family History Family History  Problem Relation Age of Onset  . Hypertension Mother   . Hypertension Father   . Hypertension Other     Social History Social History   Tobacco Use  . Smoking status: Current Every Day Smoker    Packs/day: 0.25    Types: Cigarettes  . Smokeless tobacco: Never Used  Substance Use Topics  . Alcohol use: No    Alcohol/week: 0.0 oz    Comment: former  last use1 year ago  . Drug use: Yes    Frequency: 1.0 times per week    Types: Marijuana    Comment: former     Allergies   Acetaminophen; Aspirin; Ibuprofen; Pork-derived products; and Sertraline   Review of Systems Review of Systems Ten systems reviewed and are negative for acute change, except as noted in the HPI.    Physical Exam Updated Vital Signs BP 111/71 (BP Location: Right Arm)   Pulse 62   Temp 98.6 F (37  C) (Oral)   Resp 20   SpO2 99%   Physical Exam  Constitutional: He is oriented to person, place, and time. He appears well-developed and well-nourished. No distress.  Nontoxic and in NAD  HENT:  Head: Normocephalic and atraumatic.  Eyes: Conjunctivae and EOM are normal. No scleral icterus.  Neck: Normal range of motion.  Cardiovascular: Normal rate, regular rhythm and intact distal pulses.  Pulmonary/Chest: Effort normal. No stridor. No respiratory distress. He has no wheezes.  Respirations even and unlabored. Lungs CTAB. No reproducible tenderness to palpation of the chest wall.  Musculoskeletal: Normal range of motion.  No BLE edema  Neurological: He is alert and oriented to person, place, and time. He exhibits normal muscle tone. Coordination normal.  Skin: Skin is warm and dry. No rash noted. He is not diaphoretic. No erythema. No pallor.  Psychiatric: He has a normal mood and affect. His behavior is normal.  Nursing note and vitals reviewed.    ED Treatments / Results  Labs (all labs ordered are listed, but only abnormal results are displayed) Labs Reviewed  BASIC METABOLIC PANEL - Abnormal; Notable for the following components:      Result Value   Glucose, Bld 100 (*)    All other components within normal limits  CBC - Abnormal; Notable for the following components:   RBC 3.61 (*)    Hemoglobin 10.9 (*)    HCT 34.0 (*)    All other components within normal limits  I-STAT TROPONIN, ED    EKG  EKG Interpretation  Date/Time:  Monday March 08 2017 21:43:56 EST Ventricular Rate:  61 PR Interval:    QRS Duration: 90 QT Interval:  443 QTC Calculation: 447 R Axis:   79 Text Interpretation:  Sinus rhythm ST elev, probable normal early repol pattern Baseline wander in lead(s) V4 No significant change since last tracing Confirmed by Gwyneth Sprout ([id number]) on 03/08/2017 10:29:31 PM       Radiology Dg Chest 2 View  Result Date: 03/08/2017 CLINICAL DATA:  Chest  pain EXAM: CHEST  2 VIEW COMPARISON:  Chest radiograph 10/08/2016 FINDINGS: Mild hyperinflation, unchanged. Normal cardiomediastinal contours. No focal airspace consolidation or pulmonary edema. No pleural effusion or pneumothorax. IMPRESSION: No active cardiopulmonary disease. Electronically Signed   By: Deatra Robinson M.D.   On: 03/08/2017 22:26    Procedures Procedures (including critical care time)  Medications Ordered in ED Medications  lidocaine (LIDODERM) 5 % 1 patch (1 patch Transdermal Patch Applied 03/08/17 2229)     Initial Impression / Assessment and Plan / ED Course  I have reviewed the triage vital signs and the nursing notes.  Pertinent labs & imaging results that were available during my care of the patient were reviewed by me and considered in my medical decision making (see chart for details).     46 year old male presents to  the emergency department for evaluation of chest pain.  He reports intermittent left-sided chest pain which began 2 days ago.  No associated shortness of breath, nausea, vomiting, diaphoresis.  No history of IV drug or cocaine use.  He states that he works for a [employer] company and frequently performs heavy lifting, though he denies any known trauma or injury inciting onset of his pain.  Cardiac workup today is reassuring.  Troponin negative and EKG without signs of acute ischemia.  Chest x-ray with no infiltrate, pneumothorax, pneumonia.  No mediastinal widening to suggest dissection. Patient also PERC negative. Doubt PE.  Low suspicion for emergent cause of chest pain today.  Patient was seen approximately 4 months ago for complaints of chest pain as well.  Will refer to cardiology for outpatient follow-up should symptoms persist.  Patient given Lidoderm patch for topical application and pain control.  He has allergies listed to acetaminophen and NSAIDs.  Return precautions discussed and provided.  Patient discharged in stable condition with no unaddressed  concerns.   Vitals:   03/08/17 2122 03/08/17 2144  BP:  111/71  Pulse:  62  Resp:  20  Temp:  98.6 F (37 C)  TempSrc:  Oral  SpO2: 98% 99%    Final Clinical Impressions(s) / ED Diagnoses   Final diagnoses:  Nonspecific chest pain    ED Discharge Orders        Ordered    lidocaine (LIDODERM) 5 %  Every 24 hours     03/08/17 2324       Antony MaduraHumes, Kayelyn Lemon, PA-C 03/08/17 2348    Gwyneth SproutPlunkett, Whitney, MD 03/09/17 2322

## 2017-03-08 NOTE — ED Triage Notes (Signed)
[facility] that had chest pain on and off for two days now. Pt states it is 8/10 sharp pain that goes to the left arm. Pt denise sob, n/v/d, dizziness. Pt was given nitro en route no pain relief. Pt was not given ASA due to allergies.

## 2017-03-08 NOTE — Discharge Instructions (Signed)
Your workup in the emergency department today was reassuring and did not show a concerning cause of your chest pain.  We advise that you continue follow-up with a cardiologist if pain persists.  You may use a topical Lidoderm patch as needed for persistent pain.  You may return for new or concerning symptoms.
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
## 2017-03-09 NOTE — ED Notes (Signed)
Gave pt a sprite, blanket and bus pass so that he would be comfortable sitting in the lobby until the morning.  Pt does not have any transportation home tonight.

## 2017-04-30 ENCOUNTER — Encounter (HOSPITAL_COMMUNITY): Payer: Self-pay | Admitting: Emergency Medicine

## 2017-04-30 ENCOUNTER — Emergency Department (HOSPITAL_COMMUNITY)
Admission: EM | Admit: 2017-04-30 | Discharge: 2017-04-30 | Disposition: A | Discharge status: Home / Self Care | Payer: Self-pay | Attending: Emergency Medicine | Admitting: Emergency Medicine

## 2017-04-30 ENCOUNTER — Emergency Department (HOSPITAL_COMMUNITY): Payer: Self-pay

## 2017-04-30 DIAGNOSIS — F1210 Cannabis abuse, uncomplicated: Secondary | ICD-10-CM | POA: Insufficient documentation

## 2017-04-30 DIAGNOSIS — R05 Cough: Secondary | ICD-10-CM | POA: Insufficient documentation

## 2017-04-30 DIAGNOSIS — I10 Essential (primary) hypertension: Secondary | ICD-10-CM | POA: Insufficient documentation

## 2017-04-30 DIAGNOSIS — Z79899 Other long term (current) drug therapy: Secondary | ICD-10-CM | POA: Insufficient documentation

## 2017-04-30 DIAGNOSIS — F17210 Nicotine dependence, cigarettes, uncomplicated: Secondary | ICD-10-CM | POA: Insufficient documentation

## 2017-04-30 DIAGNOSIS — R072 Precordial pain: Secondary | ICD-10-CM | POA: Insufficient documentation

## 2017-04-30 LAB — BASIC METABOLIC PANEL
ANION GAP: 9 (ref 5–15)
BUN: 10 mg/dL (ref 6–20)
CALCIUM: 9.4 mg/dL (ref 8.9–10.3)
CO2: 29 mmol/L (ref 22–32)
CREATININE: 0.99 mg/dL (ref 0.61–1.24)
Chloride: 103 mmol/L (ref 101–111)
GFR calc Af Amer: >60 mL/min (ref >60)
GLUCOSE: 149 mg/dL — AB (ref 65–99)
Potassium: 4.8 mmol/L (ref 3.5–5.1)
Sodium: 141 mmol/L (ref 135–145)

## 2017-04-30 LAB — CBC
HCT: 39 % (ref 39.0–52.0)
HEMOGLOBIN: 12.5 g/dL — AB (ref 13.0–17.0)
MCH: 30.9 pg (ref 26.0–34.0)
MCHC: 32.1 g/dL (ref 30.0–36.0)
MCV: 96.3 fL (ref 78.0–100.0)
PLATELETS: 246 10*3/uL (ref 150–400)
RBC: 4.05 MIL/uL — ABNORMAL LOW (ref 4.22–5.81)
RDW: 14.1 % (ref 11.5–15.5)
WBC: 7.4 10*3/uL (ref 4.0–10.5)

## 2017-04-30 LAB — I-STAT TROPONIN, ED: TROPONIN I, POC: 0 ng/mL (ref 0.00–0.08)

## 2017-04-30 MED ORDER — CYCLOBENZAPRINE HCL 10 MG PO TABS: 10.0000 mg | ORAL_TABLET | Freq: Two times a day (BID) | ORAL | 0 refills | Status: DC | PRN

## 2017-04-30 MED ORDER — OXYCODONE HCL 5 MG PO TABS
5.0000 mg | ORAL_TABLET | Freq: Once | ORAL | Status: AC
  Administered 2017-04-30: 5 mg via ORAL
  Filled 2017-04-30: qty 1

## 2017-04-30 MED ORDER — IOPAMIDOL (ISOVUE-300) INJECTION 61%
INTRAVENOUS | Status: AC
  Administered 2017-04-30: 75 mL via INTRAVENOUS
  Filled 2017-04-30: qty 75
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
## 2017-04-30 NOTE — ED Triage Notes (Addendum)
Pt arrives with c/o of substernal chest paint that radiates into left arm. Pt states he has been having cold chills since yesterday. Pt also concerned because he has a "knot on his chest" that has been there for "a long time".

## 2017-04-30 NOTE — Discharge Instructions (Signed)
Your workup today did not show evidence of pneumonia on CT imaging and we did not find any evidence of nodules.  We suspect your pain is more musculoskeletal in nature.  Please take the muscle relaxant to help with your symptoms.  Please follow-up with a primary care physician for reassessment and further management.  If any symptoms change or worsen, please return to the nearest emergency department.

## 2017-04-30 NOTE — ED Notes (Signed)
Got patient into to a gown on the monitor patient is resting with call bell in reach

## 2017-07-25 ENCOUNTER — Ambulatory Visit (HOSPITAL_COMMUNITY)
Admission: EM | Admit: 2017-07-25 | Discharge: 2017-07-25 | Disposition: A | Discharge status: Home / Self Care | Payer: Self-pay

## 2017-07-25 ENCOUNTER — Other Ambulatory Visit: Payer: Self-pay

## 2017-07-25 NOTE — ED Notes (Signed)
Spoke with patient about symptmos, pt decided to go to the ER instead.

## 2017-12-20 ENCOUNTER — Emergency Department (HOSPITAL_COMMUNITY)
Admission: EM | Admit: 2017-12-20 | Discharge: 2017-12-20 | Disposition: A | Discharge status: Home / Self Care | Payer: Self-pay | Attending: Emergency Medicine | Admitting: Emergency Medicine

## 2017-12-20 ENCOUNTER — Other Ambulatory Visit: Payer: Self-pay

## 2017-12-20 ENCOUNTER — Emergency Department (HOSPITAL_COMMUNITY): Payer: Self-pay

## 2017-12-20 ENCOUNTER — Encounter (HOSPITAL_COMMUNITY): Payer: Self-pay | Admitting: *Deleted

## 2017-12-20 DIAGNOSIS — F17210 Nicotine dependence, cigarettes, uncomplicated: Secondary | ICD-10-CM | POA: Insufficient documentation

## 2017-12-20 DIAGNOSIS — Z79899 Other long term (current) drug therapy: Secondary | ICD-10-CM | POA: Insufficient documentation

## 2017-12-20 DIAGNOSIS — I10 Essential (primary) hypertension: Secondary | ICD-10-CM | POA: Insufficient documentation

## 2017-12-20 DIAGNOSIS — G43909 Migraine, unspecified, not intractable, without status migrainosus: Secondary | ICD-10-CM | POA: Insufficient documentation

## 2017-12-20 MED ORDER — METOCLOPRAMIDE HCL 10 MG PO TABS
5.0000 mg | ORAL_TABLET | Freq: Once | ORAL | Status: AC
  Administered 2017-12-20: 5 mg via ORAL
  Filled 2017-12-20: qty 1

## 2017-12-20 MED ORDER — DIPHENHYDRAMINE HCL 25 MG PO CAPS
25.0000 mg | ORAL_CAPSULE | Freq: Once | ORAL | Status: AC
  Administered 2017-12-20: 25 mg via ORAL
  Filled 2017-12-20: qty 1

## 2017-12-20 MED ORDER — KETOROLAC TROMETHAMINE 30 MG/ML IJ SOLN
30.0000 mg | Freq: Once | INTRAMUSCULAR | Status: AC
  Administered 2017-12-20: 30 mg via INTRAMUSCULAR
  Filled 2017-12-20: qty 1

## 2017-12-20 NOTE — ED Notes (Signed)
Patient transported to CT 
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
## 2017-12-20 NOTE — ED Notes (Signed)
Pt given Mal[place]key sandwich and coke, ok by edp

## 2017-12-20 NOTE — ED Triage Notes (Signed)
Pt in c/o frontal headache for the last few days with photophobia and n/v, states he does not normally get headaches like this

## 2017-12-20 NOTE — Discharge Instructions (Addendum)
Please read attached information. If you experience any new or worsening signs or symptoms please return to the emergency room for evaluation. Please follow-up with your primary care provider or specialist as discussed.  °

## 2019-02-18 ENCOUNTER — Other Ambulatory Visit: Payer: Self-pay

## 2019-02-18 ENCOUNTER — Encounter (HOSPITAL_COMMUNITY): Payer: Self-pay

## 2019-02-18 ENCOUNTER — Emergency Department (HOSPITAL_COMMUNITY)
Admission: EM | Admit: 2019-02-18 | Discharge: 2019-02-19 | Disposition: A | Discharge status: Home / Self Care | Payer: Self-pay | Attending: Emergency Medicine | Admitting: Emergency Medicine

## 2019-02-18 DIAGNOSIS — S0083XA Contusion of other part of head, initial encounter: Secondary | ICD-10-CM | POA: Insufficient documentation

## 2019-02-18 DIAGNOSIS — R519 Headache, unspecified: Secondary | ICD-10-CM | POA: Insufficient documentation

## 2019-02-18 DIAGNOSIS — R42 Dizziness and giddiness: Secondary | ICD-10-CM | POA: Insufficient documentation

## 2019-02-18 DIAGNOSIS — H538 Other visual disturbances: Secondary | ICD-10-CM | POA: Insufficient documentation

## 2019-02-18 DIAGNOSIS — W208XXA Other cause of strike by thrown, projected or falling object, initial encounter: Secondary | ICD-10-CM | POA: Insufficient documentation

## 2019-02-18 DIAGNOSIS — Y999 Unspecified external cause status: Secondary | ICD-10-CM | POA: Insufficient documentation

## 2019-02-18 DIAGNOSIS — S01112A Laceration without foreign body of left eyelid and periocular area, initial encounter: Secondary | ICD-10-CM | POA: Insufficient documentation

## 2019-02-18 DIAGNOSIS — Z23 Encounter for immunization: Secondary | ICD-10-CM | POA: Insufficient documentation

## 2019-02-18 DIAGNOSIS — Y92511 Restaurant or cafe as the place of occurrence of the external cause: Secondary | ICD-10-CM | POA: Insufficient documentation

## 2019-02-18 DIAGNOSIS — F17210 Nicotine dependence, cigarettes, uncomplicated: Secondary | ICD-10-CM | POA: Insufficient documentation

## 2019-02-18 DIAGNOSIS — S0990XA Unspecified injury of head, initial encounter: Secondary | ICD-10-CM | POA: Insufficient documentation

## 2019-02-18 DIAGNOSIS — Y9301 Activity, walking, marching and hiking: Secondary | ICD-10-CM | POA: Insufficient documentation
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
## 2019-02-19 ENCOUNTER — Emergency Department (HOSPITAL_COMMUNITY): Payer: Self-pay

## 2019-02-19 MED ORDER — TETANUS-DIPHTH-ACELL PERTUSSIS 5-2.5-18.5 LF-MCG/0.5 IM SUSP
0.5000 mL | Freq: Once | INTRAMUSCULAR | Status: AC
  Administered 2019-02-19: 03:00:00 0.5 mL via INTRAMUSCULAR
  Filled 2019-02-19: qty 0.5

## 2019-02-19 NOTE — ED Notes (Signed)
Cleansed laceration to left eyebrow area

## 2019-02-19 NOTE — Discharge Instructions (Addendum)
You have allergies to Tylenol and ibuprofen. Recommend cool compresses to the sore areas. Get plenty of rest.
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
## 2019-02-19 NOTE — ED Notes (Signed)
ED Provider at bedside. 

## 2019-03-16 ENCOUNTER — Emergency Department (HOSPITAL_COMMUNITY)
Admission: EM | Admit: 2019-03-16 | Discharge: 2019-03-17 | Disposition: A | Discharge status: Other Acute Inpatient Hospital | Payer: Self-pay | Attending: Emergency Medicine | Admitting: Emergency Medicine

## 2019-03-16 ENCOUNTER — Other Ambulatory Visit: Payer: Self-pay

## 2019-03-16 ENCOUNTER — Encounter (HOSPITAL_COMMUNITY): Payer: Self-pay | Admitting: Emergency Medicine

## 2019-03-16 DIAGNOSIS — R519 Headache, unspecified: Secondary | ICD-10-CM | POA: Insufficient documentation

## 2019-03-16 DIAGNOSIS — F329 Major depressive disorder, single episode, unspecified: Secondary | ICD-10-CM | POA: Insufficient documentation

## 2019-03-16 DIAGNOSIS — R45851 Suicidal ideations: Secondary | ICD-10-CM | POA: Insufficient documentation

## 2019-03-16 DIAGNOSIS — Z20828 Contact with and (suspected) exposure to other viral communicable diseases: Secondary | ICD-10-CM | POA: Insufficient documentation

## 2019-03-16 DIAGNOSIS — F0781 Postconcussional syndrome: Secondary | ICD-10-CM | POA: Insufficient documentation
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
## 2019-03-17 ENCOUNTER — Inpatient Hospital Stay (HOSPITAL_COMMUNITY)
Admission: AD | Admit: 2019-03-17 | Discharge: 2019-03-20 | DRG: 885 | Disposition: A | Discharge status: Home / Self Care | Payer: No Typology Code available for payment source | Source: Intra-hospital | Attending: Psychiatry | Admitting: Psychiatry

## 2019-03-17 ENCOUNTER — Other Ambulatory Visit: Payer: Self-pay

## 2019-03-17 ENCOUNTER — Encounter (HOSPITAL_COMMUNITY): Payer: Self-pay | Admitting: *Deleted

## 2019-03-17 DIAGNOSIS — F332 Major depressive disorder, recurrent severe without psychotic features: Secondary | ICD-10-CM | POA: Diagnosis present

## 2019-03-17 DIAGNOSIS — F17210 Nicotine dependence, cigarettes, uncomplicated: Secondary | ICD-10-CM | POA: Diagnosis present

## 2019-03-17 DIAGNOSIS — Z20828 Contact with and (suspected) exposure to other viral communicable diseases: Secondary | ICD-10-CM | POA: Diagnosis present

## 2019-03-17 DIAGNOSIS — R45851 Suicidal ideations: Secondary | ICD-10-CM | POA: Diagnosis present

## 2019-03-17 DIAGNOSIS — F1210 Cannabis abuse, uncomplicated: Secondary | ICD-10-CM | POA: Diagnosis present

## 2019-03-17 DIAGNOSIS — G4700 Insomnia, unspecified: Secondary | ICD-10-CM | POA: Diagnosis present

## 2019-03-17 LAB — COMPREHENSIVE METABOLIC PANEL
ALT: 11 U/L (ref 0–44)
AST: 16 U/L (ref 15–41)
Albumin: 4.2 g/dL (ref 3.5–5.0)
Alkaline Phosphatase: 34 U/L — ABNORMAL LOW (ref 38–126)
Anion gap: 10 (ref 5–15)
BUN: 12 mg/dL (ref 6–20)
CO2: 29 mmol/L (ref 22–32)
Calcium: 9.6 mg/dL (ref 8.9–10.3)
Chloride: 102 mmol/L (ref 98–111)
Creatinine, Ser: 0.95 mg/dL (ref 0.61–1.24)
GFR calc Af Amer: >60 mL/min (ref >60)
GFR calc non Af Amer: >60 mL/min (ref >60)
Glucose, Bld: 76 mg/dL (ref 70–99)
Potassium: 4.2 mmol/L (ref 3.5–5.1)
Sodium: 141 mmol/L (ref 135–145)
Total Bilirubin: 0.8 mg/dL (ref 0.3–1.2)
Total Protein: 6.9 g/dL (ref 6.5–8.1)

## 2019-03-17 LAB — CBC
HCT: 38.5 % — ABNORMAL LOW (ref 39.0–52.0)
Hemoglobin: 12.4 g/dL — ABNORMAL LOW (ref 13.0–17.0)
MCH: 30.8 pg (ref 26.0–34.0)
MCHC: 32.2 g/dL (ref 30.0–36.0)
MCV: 95.8 fL (ref 80.0–100.0)
Platelets: 233 10*3/uL (ref 150–400)
RBC: 4.02 MIL/uL — ABNORMAL LOW (ref 4.22–5.81)
RDW: 13.1 % (ref 11.5–15.5)
WBC: 5.7 10*3/uL (ref 4.0–10.5)
nRBC: 0 % (ref 0.0–0.2)

## 2019-03-17 LAB — SALICYLATE LEVEL: Salicylate Lvl: <7 mg/dL (ref 2.8–30.0)

## 2019-03-17 LAB — ETHANOL: Alcohol, Ethyl (B): <10 mg/dL (ref <10)

## 2019-03-17 LAB — RAPID URINE DRUG SCREEN, HOSP PERFORMED
Amphetamines: NOT DETECTED
Barbiturates: NOT DETECTED
Benzodiazepines: NOT DETECTED
Cocaine: NOT DETECTED
Opiates: NOT DETECTED
Tetrahydrocannabinol: POSITIVE — AB

## 2019-03-17 LAB — ACETAMINOPHEN LEVEL: Acetaminophen (Tylenol), Serum: <10 ug/mL — ABNORMAL LOW (ref 10–30)

## 2019-03-17 LAB — TROPONIN I (HIGH SENSITIVITY): Troponin I (High Sensitivity): 4 ng/L (ref <18)

## 2019-03-17 LAB — SARS CORONAVIRUS 2 BY RT PCR (HOSPITAL ORDER, PERFORMED IN ~~LOC~~ HOSPITAL LAB): SARS Coronavirus 2: NEGATIVE

## 2019-03-17 MED ORDER — TRAZODONE HCL 50 MG PO TABS
50.0000 mg | ORAL_TABLET | Freq: Every evening | ORAL | Status: DC | PRN
  Filled 2019-03-17: qty 1

## 2019-03-17 MED ORDER — DIPHENHYDRAMINE HCL 50 MG/ML IJ SOLN
25.0000 mg | Freq: Once | INTRAMUSCULAR | Status: AC
  Administered 2019-03-17: 25 mg via INTRAVENOUS
  Filled 2019-03-17: qty 1

## 2019-03-17 MED ORDER — HYDROXYZINE HCL 25 MG PO TABS
25.0000 mg | ORAL_TABLET | Freq: Three times a day (TID) | ORAL | Status: DC | PRN
  Filled 2019-03-17: qty 1

## 2019-03-17 MED ORDER — SODIUM CHLORIDE 0.9 % IV BOLUS (SEPSIS)
1000.0000 mL | Freq: Once | INTRAVENOUS | Status: AC
  Administered 2019-03-17: 1000 mL via INTRAVENOUS

## 2019-03-17 MED ORDER — METOCLOPRAMIDE HCL 5 MG/ML IJ SOLN
10.0000 mg | Freq: Once | INTRAMUSCULAR | Status: AC
  Administered 2019-03-17: 10 mg via INTRAVENOUS
  Filled 2019-03-17: qty 2

## 2019-03-17 MED ORDER — MAGNESIUM HYDROXIDE 400 MG/5ML PO SUSP: 30.0000 mL | Freq: Every day | ORAL | Status: DC | PRN

## 2019-03-17 MED ORDER — ALUM & MAG HYDROXIDE-SIMETH 200-200-20 MG/5ML PO SUSP: 30.0000 mL | ORAL | Status: DC | PRN

## 2019-03-17 NOTE — ED Notes (Signed)
Breakfast Ordered 

## 2019-03-17 NOTE — ED Notes (Signed)
Pt informed of transfer to [facility], IV removed. All pt belongings secured with transport vendor. Report called to Jan at [facility]. Pt left with transport to [facility]

## 2019-03-17 NOTE — Tx Team (Signed)
Initial Treatment Plan 03/17/2019 4:48 PM [name] [id number]    PATIENT STRESSORS: Financial difficulties Loss of daughter passed away   PATIENT STRENGTHS: Ability for insight Capable of independent living General fund of knowledge Physical Health   PATIENT IDENTIFIED PROBLEMS: Grief and loss  Financial problems                   DISCHARGE CRITERIA:  Ability to meet basic life and health needs Adequate post-discharge living arrangements Improved stabilization in mood, thinking, and/or behavior Medical problems require only outpatient monitoring Motivation to continue treatment in a less acute level of care Need for constant or close observation no longer present Reduction of life-threatening or endangering symptoms to within safe limits Safe-care adequate arrangements made Verbal commitment to aftercare and medication compliance  PRELIMINARY DISCHARGE PLAN: Outpatient therapy Return to previous living arrangement  PATIENT/FAMILY INVOLVEMENT: This treatment plan has been presented to and reviewed with the patient, [name], and/or family member, .  The patient and family have been given the opportunity to ask questions and make suggestions.  Mosie Lukes, RN 03/17/2019, 4:48 PM

## 2019-03-17 NOTE — H&P (Signed)
Psychiatric Admission Assessment Adult  Patient Identification: [name] MRN:  [id number] Date of Evaluation:  03/17/2019 Chief Complaint:  " I need help, I need to be evaluated" Principal Diagnosis: MDD, no psychotic symptoms Diagnosis:  Active Problems:   MDD (major depressive disorder), recurrent episode, severe (HCC)  History of Present Illness: 48 year old male, presented to hospital voluntarily on 11/12 . Reported worsening  depression and recent suicidal ideations, which he currently describes as passive , without specific plan. States " I just felt tired, like giving up, like what's the use anymore". He describes multiple contributing stressors, including financial difficulties, having difficulty affording services and rent.States his power was turned off earlier this week. Also reports his young adult daughter died from COVID related complications in April. States " I feel like I have been screwed up since then, feel like I can't get ahead".  Endorses neuro-vegetative symptoms as below .   Associated Signs/Symptoms: Depression Symptoms:  depressed mood, anhedonia, insomnia, suicidal thoughts without plan, loss of energy/fatigue, decreased appetite, (Hypo) Manic Symptoms: none noted or endorsed  Anxiety Symptoms:  Reports increased anxiety in the context of stressors  Psychotic Symptoms:  Denies  PTSD Symptoms: Does not endorse  Total Time spent with patient: 45 minutes  Past Psychiatric History: history of prior psychiatric admissions , most recently in 2016 . At the time presented for depression, substance abuse .  (He reports that he has been sober/abstinent from alcohol for several years. Uses cannabis regularly). No history of suicide attempts .  Reports remote history of auditory hallucinations but not " in many years. I think it had to do with my drinking back then". Reports history of depression, which he attributes at least in part to significant psychosocial  stressors.   Is the patient at risk to self? Yes.    Has the patient been a risk to self in the past 6 months? Yes.    Has the patient been a risk to self within the distant past? No.  Is the patient a risk to others? No.  Has the patient been a risk to others in the past 6 months? No.  Has the patient been a risk to others within the distant past? No.   Prior Inpatient Therapy:  as above  Prior Outpatient Therapy:  no   Alcohol Screening: Patient refused Alcohol Screening Tool: Yes 1. How often do you have a drink containing alcohol?: Never 2. How many drinks containing alcohol do you have on a typical day when you are drinking?: 1 or 2 3. How often do you have six or more drinks on one occasion?: Never AUDIT-C Score: 0 4. How often during the last year have you found that you were not able to stop drinking once you had started?: Never 5. How often during the last year have you failed to do what was normally expected from you becasue of drinking?: Never 6. How often during the last year have you needed a first drink in the morning to get yourself going after a heavy drinking session?: Never 7. How often during the last year have you had a feeling of guilt of remorse after drinking?: Never 8. How often during the last year have you been unable to remember what happened the night before because you had been drinking?: Never 9. Have you or someone else been injured as a result of your drinking?: No 10. Has a relative or friend or a doctor or another health worker been concerned about your  drinking or suggested you cut down?: No Alcohol Use Disorder Identification Test Final Score (AUDIT): 0 Alcohol Brief Interventions/Follow-up: AUDIT Score <7 follow-up not indicated Substance Abuse History in the last 12 months:  History of alcohol use disorder now in sustained remission, states he has been sober x 6 years. Smokes cannabis regularly. Denies other drug abuse . Consequences of Substance  Abuse: Remote history of blackouts , financial difficulties related to alcohol abuse in the past, as noted , reports he has been sober x several years . Previous Psychotropic Medications: None/  was not taking any medications prior to admission.  Psychological Evaluations:  No  Past Medical History:  Denies medical illnesses, reports allergy to ASA, NSAID- cause hives, nausea.  Reports recent head injury last month related to accidental injury ( door closed on his head). At the time had a Head CT scan ( 10/18) which was negative. Past Medical History:  Diagnosis Date  . Alcohol abuse   . Anemia   . Colitis   . Severe major depression with psychotic features ([place])    History reviewed. No pertinent surgical history. Family History: father died 46 years ago, mother died in 4. Has two sisters and a half brother. Family History  Problem Relation Age of Onset  . Hypertension Mother   . Hypertension Father   . Hypertension Other    Family Psychiatric  History: reports both parents died from complications of alcohol use disorder . No suicides in family  Tobacco Screening: Have you used any form of tobacco in the last 30 days? (Cigarettes, Smokeless Tobacco, Cigars, and/or Pipes): Yes Tobacco use, Select all that apply: 5 or more cigarettes per day Are you interested in Tobacco Cessation Medications?: No, patient refused Counseled patient on smoking cessation including recognizing danger situations, developing coping skills and basic information about quitting provided: Refused/Declined practical counseling Social History: 60, lives with sister , three children, reports one of her children died from [place] complications earlier this year ( April/2020) . Social History   Substance and Sexual Activity  Alcohol Use No   Comment: former  last use1 year ago     Social History   Substance and Sexual Activity  Drug Use Yes  . Frequency: 1.0 times per week  . Types: Marijuana   Comment: former     Additional Social History:   Allergies:   Allergies  Allergen Reactions  . Acetaminophen Nausea Only  . Aspirin Hives and Nausea And Vomiting  . Ibuprofen Hives and Nausea And Vomiting  . Pork-Derived Products Other (See Comments)    Stomach pain   . Sertraline Diarrhea and Other (See Comments)    Acute GI distress with diarrhea and pain.    Lab Results:  Results for orders placed or performed during the hospital encounter of 03/16/19 (from the past 48 hour(s))  Rapid urine drug screen (hospital performed)     Status: Abnormal   Collection Time: 03/16/19 11:42 PM  Result Value Ref Range   Opiates NONE DETECTED NONE DETECTED   Cocaine NONE DETECTED NONE DETECTED   Benzodiazepines NONE DETECTED NONE DETECTED   Amphetamines NONE DETECTED NONE DETECTED   Tetrahydrocannabinol POSITIVE (A) NONE DETECTED   Barbiturates NONE DETECTED NONE DETECTED    Comment: (NOTE) DRUG SCREEN FOR MEDICAL PURPOSES ONLY.  IF CONFIRMATION IS NEEDED FOR ANY PURPOSE, NOTIFY LAB WITHIN 5 DAYS. LOWEST DETECTABLE LIMITS FOR URINE DRUG SCREEN Drug Class  Cutoff (ng/mL) Amphetamine and metabolites    1000 Barbiturate and metabolites    200 Benzodiazepine                 200 Tricyclics and metabolites     300 Opiates and metabolites        300 Cocaine and metabolites        300 THC                            50 Performed at [facility], [address] [place], [place] [address]   Comprehensive metabolic panel     Status: Abnormal   Collection Time: 03/16/19 11:49 PM  Result Value Ref Range   Sodium 141 135 - 145 mmol/L   Potassium 4.2 3.5 - 5.1 mmol/L   Chloride 102 98 - 111 mmol/L   CO2 29 22 - 32 mmol/L   Glucose, Bld 76 70 - 99 mg/dL   BUN 12 6 - 20 mg/dL   Creatinine, Ser 6.040.95 0.61 - 1.24 mg/dL   Calcium 9.6 8.9 - 54.010.3 mg/dL   Total Protein 6.9 6.5 - 8.1 g/dL   Albumin 4.2 3.5 - 5.0 g/dL   AST 16 15 - 41 U/L   ALT 11 0 - 44 U/L   Alkaline Phosphatase 34 (L)  38 - 126 U/L   Total Bilirubin 0.8 0.3 - 1.2 mg/dL   GFR calc non Af Amer >60 >60 mL/min   GFR calc Af Amer >60 >60 mL/min   Anion gap 10 5 - 15    Comment: Performed at [facility], [address] [place], [place] [address]  Ethanol     Status: None   Collection Time: 03/16/19 11:49 PM  Result Value Ref Range   Alcohol, Ethyl (B) <10 <10 mg/dL    Comment: (NOTE) Lowest detectable limit for serum alcohol is 10 mg/dL. For medical purposes only. Performed at [facility], [address] [place], [place] [address]   Salicylate level     Status: None   Collection Time: 03/16/19 11:49 PM  Result Value Ref Range   Salicylate Lvl <7.0 2.8 - 30.0 mg/dL    Comment: Performed at [facility], [address] [place], [place] [address]  Acetaminophen level     Status: Abnormal   Collection Time: 03/16/19 11:49 PM  Result Value Ref Range   Acetaminophen (Tylenol), Serum <10 (L) 10 - 30 ug/mL    Comment: (NOTE) Therapeutic concentrations vary significantly. A range of 10-30 ug/mL  may be an effective concentration for many patients. However, some  are best treated at concentrations outside of this range. Acetaminophen concentrations >150 ug/mL at 4 hours after ingestion  and >50 ug/mL at 12 hours after ingestion are often associated with  toxic reactions. Performed at [facility], [address] [place], [place] [address]   cbc     Status: Abnormal   Collection Time: 03/16/19 11:49 PM  Result Value Ref Range   WBC 5.7 4.0 - 10.5 K/uL   RBC 4.02 (L) 4.22 - 5.81 MIL/uL   Hemoglobin 12.4 (L) 13.0 - 17.0 g/dL   HCT 78.438.5 (L) 69.639.0 - 29.552.0 %   MCV 95.8 80.0 - 100.0 fL   MCH 30.8 26.0 - 34.0 pg   MCHC 32.2 30.0 - 36.0 g/dL   RDW 28.413.1 13.211.5 - 44.015.5 %   Platelets 233 150 - 400 K/uL  nRBC 0.0 0.0 - 0.2 %    Comment: Performed at [facility], [address], [place], [place] [address]  Troponin I (High Sensitivity)     Status: None   Collection Time:  03/16/19 11:49 PM  Result Value Ref Range   Troponin I (High Sensitivity) 4 <18 ng/L    Comment: (NOTE) Elevated high sensitivity troponin I (hsTnI) values and significant  changes across serial measurements may suggest ACS but many other  chronic and acute conditions are known to elevate hsTnI results.  Refer to the "Links" section for chest pain algorithms and additional  guidance. Performed at [facility], [address] [place], [place] [address]   SARS Coronavirus 2 by RT PCR (hospital order, performed in [facility] hospital lab) Nasopharyngeal Nasopharyngeal Swab     Status: None   Collection Time: 03/17/19  4:27 AM   Specimen: Nasopharyngeal Swab  Result Value Ref Range   SARS Coronavirus 2 NEGATIVE NEGATIVE    Comment: (NOTE) If result is NEGATIVE SARS-CoV-2 target nucleic acids are NOT DETECTED. The SARS-CoV-2 RNA is generally detectable in upper and lower  respiratory specimens during the acute phase of infection. The lowest  concentration of SARS-CoV-2 viral copies this assay can detect is 250  copies / mL. A negative result does not preclude SARS-CoV-2 infection  and should not be used as the sole basis for treatment or other  patient management decisions.  A negative result may occur with  improper specimen collection / handling, submission of specimen other  than nasopharyngeal swab, presence of viral mutation(s) within the  areas targeted by this assay, and inadequate number of viral copies  (<250 copies / mL). A negative result must be combined with clinical  observations, patient history, and epidemiological information. If result is POSITIVE SARS-CoV-2 target nucleic acids are DETECTED. The SARS-CoV-2 RNA is generally detectable in upper and lower  respiratory specimens dur ing the acute phase of infection.  Positive  results are indicative of active infection with SARS-CoV-2.  Clinical  correlation with patient history and other diagnostic information  is  necessary to determine patient infection status.  Positive results do  not rule out bacterial infection or co-infection with other viruses. If result is PRESUMPTIVE POSTIVE SARS-CoV-2 nucleic acids MAY BE PRESENT.   A presumptive positive result was obtained on the submitted specimen  and confirmed on repeat testing.  While 2019 novel coronavirus  (SARS-CoV-2) nucleic acids may be present in the submitted sample  additional confirmatory testing may be necessary for epidemiological  and / or clinical management purposes  to differentiate between  SARS-CoV-2 and other Sarbecovirus currently known to infect humans.  If clinically indicated additional testing with an alternate test  methodology [phone]) is advised. The SARS-CoV-2 RNA is generally  detectable in upper and lower respiratory sp ecimens during the acute  phase of infection. The expected result is Negative. Fact Sheet for Patients:  BoilerBrush.com.cy Fact Sheet for Healthcare Providers: https://pope.com/ This test is not yet approved or cleared by the [place] FDA and has been authorized for detection and/or diagnosis of SARS-CoV-2 by FDA under an Emergency Use Authorization (EUA).  This EUA will remain in effect (meaning this test can be used) for the duration of the COVID-19 declaration under Section 564(b)(1) of the Act, 21 U.S.C. section 360bbb-3(b)(1), unless the authorization is terminated or revoked sooner. Performed at [facility], [address], [place], [place] [address]     Blood Alcohol level:  Lab Results  Component Value Date   ETH <10 03/16/2019   ETH <5 06/08/2014    Metabolic Disorder Labs:  Lab Results  Component Value Date   HGBA1C 5.2 08/27/2014   MPG 103 08/27/2014   MPG 105 03/08/2013   No results found for: PROLACTIN No results found for: CHOL, TRIG, HDL, CHOLHDL, VLDL, LDLCALC  Current Medications: Current  Facility-Administered Medications  Medication Dose Route Frequency Provider Last Rate Last Dose  . alum & mag hydroxide-simeth (MAALOX/MYLANTA) 200-200-20 MG/5ML suspension 30 mL  30 mL Oral Q4H PRN Anike, Adaku C, NP      . hydrOXYzine (ATARAX/VISTARIL) tablet 25 mg  25 mg Oral TID PRN Anike, Adaku C, NP      . magnesium hydroxide (MILK OF MAGNESIA) suspension 30 mL  30 mL Oral Daily PRN Anike, Adaku C, NP      . traZODone (DESYREL) tablet 50 mg  50 mg Oral QHS PRN Anike, Adaku C, NP       PTA Medications: No medications prior to admission.    Musculoskeletal: Strength & Muscle Tone: within normal limits Gait & Station: normal Patient leans: N/A  Psychiatric Specialty Exam: Physical Exam  Review of Systems  Constitutional: Negative.  Negative for chills and fever.  HENT: Negative.   Eyes: Negative.   Respiratory: Negative for cough.   Cardiovascular: Negative for chest pain.  Gastrointestinal: Negative for diarrhea, nausea and vomiting.  Genitourinary: Negative.   Musculoskeletal: Negative.   Skin: Negative.  Negative for rash.  Neurological: Positive for headaches. Negative for seizures.  Endo/Heme/Allergies: Negative.   Psychiatric/Behavioral: Positive for depression, substance abuse and suicidal ideas.    Blood pressure 116/81, pulse 69, temperature 98.2 F (36.8 C), temperature source Oral, resp. rate 16, height  (1.702 m), weight 67.1 kg.Body mass index is 23.18 kg/m.  General Appearance: Fairly Groomed  Eye Contact:  Good  Speech:  Normal Rate  Volume:  Normal  Mood:  depressed, anxious  Affect:  congruent   Thought Process:  Linear and Descriptions of Associations: Intact  Orientation:  Other:  fully alert and attentive  Thought Content:  no hallucinations, no delusions, not internally preoccupied   Suicidal Thoughts:  No denies suicidal ideations at this time , contracts for safety at present   Homicidal Thoughts:  No  Memory:  recent and remote grossly  intac t  Judgement:  Fair  Insight:  Fair  Psychomotor Activity:  Normal- no psychomotor agitation or restlessness   Concentration:  Concentration: Good and Attention Span: Good  Recall:  Good  Fund of Knowledge:  Good  Language:  Good  Akathisia:  Negative  Handed:  Right  AIMS (if indicated):     Assets:  Communication Skills Desire for Improvement Resilience  ADL's:  Intact  Cognition:  WNL  Sleep:       Treatment Plan Summary: Daily contact with patient to assess and evaluate symptoms and progress in treatment, Medication management, Plan inpatient treatment  and medications as below   Observation Level/Precautions:  15 minute checks  Laboratory: as needed   Psychotherapy:  Milieu, group therapy   Medications:  We discussed treatment options and I encouraged him to consider an antidepressant trial. At this time he is reluctant to do so, states " what I really need is to have somebody to talk to andhelp with my situation" (referring to financial/housing stressors). Vistaril/trazodone as needed's for anxiety or insomnia as needed  Consultations: as needed    Discharge Concerns:  -  Estimated LOS: 3 to 4 days  Other:     Physician Treatment Plan for Primary Diagnosis: MDD versus adjustment disorder with depressed mood. Long Term Goal(s): Improvement in symptoms so as ready for discharge  Short Term Goals: Ability to identify changes in lifestyle to reduce recurrence of condition will improve, Ability to verbalize feelings will improve, Ability to disclose and discuss suicidal ideas, Ability to demonstrate self-control will improve, Ability to identify and develop effective coping behaviors will improve and Ability to maintain clinical measurements within normal limits will improve  Physician Treatment Plan for Secondary Diagnosis: Suicidal ideations Long Term Goal(s): Improvement in symptoms so as ready for discharge  Short Term Goals: Ability to identify changes in lifestyle  to reduce recurrence of condition will improve, Ability to verbalize feelings will improve, Ability to disclose and discuss suicidal ideas, Ability to demonstrate self-control will improve, Ability to identify and develop effective coping behaviors will improve and Ability to maintain clinical measurements within normal limits will improve  I certify that inpatient services furnished can reasonably be expected to improve the patient's condition.    Craige Cotta, MD 11/13/20204:46 PM

## 2019-03-17 NOTE — Progress Notes (Signed)
Pt admitted to [facility] voluntary from [facility] with si and no plan. Pt reports that his main stressor is financial stress. Pt was carrying his deceased daughters ashes on admission. She passed away recently with covid. Pt was vague and irritable on admission reluctant to answer questions. Pt lives with his 49 yr old daughter. Pt reports that he just wants to sleep and has not been able to get rest.

## 2019-03-17 NOTE — Progress Notes (Signed)
Per Talbot Grumbling, NP pt meets criteria for inpt tx. Pt accepted to [facility] [id number] after 8am pending negative Covid test. Call to report [phone]. Attending provider will be Dr. Mallie Darting, MD. Pt's nurse Gretta Cool, RN and EDP Ward, Delice Bison, DO has been advised.  Lind Covert, MSW, LCSW Therapeutic Triage Specialist  [phone]
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
## 2019-03-17 NOTE — BH Assessment (Signed)
Tele Assessment Note   Patient Name: [name] FischerRashaan May MRN: [id number] Referring Physician: Ward, Layla MawKristen N, DO Location of Patient: [facility] Location of Provider: Behavioral Health TTS Department  [name] is an 48 y.o. male who presents to the ED voluntarily. Pt is vague throughout the assessment. Pt reports he is suicidal without a plan. Pt states his daughter passed away due to Covid. Pt states he feels stressed and overwhelmed. Pt reports he has been trying to get caught up on bills but he is struggling financially. Pt states he has upcoming court dates due to child support. Pt reports depressive symptoms including hopelessness, sleep changes, low mood, no motivation, and thoughts of wanting to die. Pt denies AVH to this writer, however he has a hx of depression with psychotic features.   TTS asked the pt for permission to contact any collateral supports and he states he does not have any supports.  Per Renaye RakersAdaku Anike, NP pt meets criteria for inpt tx. [facility] pending negative Covid test. Call to report [phone]. Attending provider will be Dr. Jola Babinskilary, MD. Pt's nurse Elliot GurneyWoody, RN and EDP Ward, Layla MawKristen N, DO has been advised.  Diagnosis: MDD, recurrent, severe, w/o psychosis; Cannabis use d/o, severe  Past Medical History:  Past Medical History:  Diagnosis Date  . Alcohol abuse   . Anemia   . Colitis   . Severe major depression with psychotic features (HCC)     History reviewed. No pertinent surgical history.  Family History:  Family History  Problem Relation Age of Onset  . Hypertension Mother   . Hypertension Father   . Hypertension Other     Social History:  reports that he has been smoking cigarettes. He has been smoking about 0.25 packs per day. He has never used smokeless tobacco. He reports current drug use. Frequency: 1.00 time per week. Drug: Marijuana. He reports that he does not drink alcohol.  Additional Social History:  Alcohol / Drug  Use Pain Medications: See MAR Prescriptions: See MAR Over the Counter: See MAR History of alcohol / drug use?: Yes Substance #1 Name of Substance 1: Cannabis 1 - Age of First Use: teens 1 - Amount (size/oz): varies 1 - Frequency: weekly 1 - Duration: ongoing 1 - Last Use / Amount: unknown, labs positive on arrival to ED  CIWA: CIWA-Ar BP: 122/74 Pulse Rate: 66 COWS:    Allergies:  Allergies  Allergen Reactions  . Acetaminophen Nausea Only  . Aspirin Hives and Nausea And Vomiting  . Ibuprofen Hives and Nausea And Vomiting  . Pork-Derived Products Other (See Comments)    Stomach pain   . Sertraline Diarrhea and Other (See Comments)    Acute GI distress with diarrhea and pain.     Home Medications: (Not in a hospital admission)   OB/GYN Status:  No LMP for male patient.  General Assessment Data [facility] Assessment: In system Is this a Tele or Face-to-Face Assessment?: Tele Assessment Is this an Initial Assessment or a Re-assessment for this encounter?: Initial Assessment Patient Accompanied by:: N/A Language Other than English: No Living Arrangements: Other (Comment) What gender do you identify as?: Male Marital status: Single Pregnancy Status: No Living Arrangements: Children Can pt return to current living arrangement?: Yes Admission Status: Voluntary Is patient capable of signing voluntary admission?: Yes Referral Source: Self/Family/Friend Insurance type: none     Crisis Care Plan Living Arrangements: Children Name of Psychiatrist: none Name of Therapist: none  Education  Status Is patient currently in school?: No Is the patient employed, unemployed or receiving disability?: Unemployed  Risk to self with the past 6 months Suicidal Ideation: Yes-Currently Present Has patient been a risk to self within the past 6 months prior to admission? : No Suicidal Intent: No Has patient had any suicidal intent within the past 6 months  prior to admission? : No Is patient at risk for suicide?: Yes Suicidal Plan?: No Has patient had any suicidal plan within the past 6 months prior to admission? : No Access to Means: No What has been your use of drugs/alcohol within the last 12 months?: cannabis Previous Attempts/Gestures: Yes How many times?: 1 Other Self Harm Risks: hx of depression, loss  Triggers for Past Attempts: Other personal contacts Intentional Self Injurious Behavior: None Family Suicide History: No Recent stressful life event(s): Loss (Comment), Financial Problems(daughter passed away) Persecutory voices/beliefs?: No Depression: Yes Depression Symptoms: Despondent, Loss of interest in usual pleasures, Feeling worthless/self pity Substance abuse history and/or treatment for substance abuse?: Yes Suicide prevention information given to non-admitted patients: Not applicable  Risk to Others within the past 6 months Homicidal Ideation: No Does patient have any lifetime risk of violence toward others beyond the six months prior to admission? : No Thoughts of Harm to Others: No Current Homicidal Intent: No Current Homicidal Plan: No Access to Homicidal Means: No History of harm to others?: No Assessment of Violence: None Noted Does patient have access to weapons?: No Criminal Charges Pending?: Yes Describe Pending Criminal Charges: child support Does patient have a court date: Yes Court Date: (December 2020) Is patient on probation?: No  Psychosis Hallucinations: None noted Delusions: None noted  Mental Status Report Appearance/Hygiene: In hospital gown Eye Contact: Fair Motor Activity: Freedom of movement Speech: Slow Level of Consciousness: Quiet/awake Mood: Depressed, Despair, Sad Affect: Depressed, Blunted Anxiety Level: None Thought Processes: Coherent, Relevant Judgement: Impaired Orientation: Person, Situation, Time, Place, Appropriate for developmental age Obsessive Compulsive  Thoughts/Behaviors: None  Cognitive Functioning Concentration: Fair Memory: Remote Intact, Recent Intact Is patient IDD: No Insight: Poor Impulse Control: Poor Appetite: Good Have you had any weight changes? : No Change Sleep: Decreased Total Hours of Sleep: 5 Vegetative Symptoms: None  ADLScreening [facility] Assessment Services) Patient's cognitive ability adequate to safely complete daily activities?: Yes Patient able to express need for assistance with ADLs?: Yes Independently performs ADLs?: Yes (appropriate for developmental age)  Prior Inpatient Therapy Prior Inpatient Therapy: Yes Prior Therapy Dates: 2016 Prior Therapy Facilty/Provider([facility]): [facility] Reason for Treatment: ALCOHOL DEPENDENCE, DEPRESSION  Prior Outpatient Therapy Prior Outpatient Therapy: No Does patient have an ACCT team?: No Does patient have Intensive In-House Services?  : No Does patient have [facility] services? : No Does patient have P4CC services?: No  ADL Screening (condition at time of admission) Patient's cognitive ability adequate to safely complete daily activities?: Yes Is the patient deaf or have difficulty hearing?: No Does the patient have difficulty seeing, even when wearing glasses/contacts?: No Does the patient have difficulty concentrating, remembering, or making decisions?: No Patient able to express need for assistance with ADLs?: Yes Does the patient have difficulty dressing or bathing?: No Independently performs ADLs?: Yes (appropriate for developmental age) Does the patient have difficulty walking or climbing stairs?: No Weakness of Legs: None Weakness of Arms/Hands: None  Home Assistive Devices/Equipment Home Assistive Devices/Equipment: None    Abuse/Neglect Assessment (Assessment to be complete while patient is alone) Abuse/Neglect Assessment Can Be Completed: Yes Physical Abuse: Denies Verbal Abuse: Denies Sexual  Abuse: Denies Exploitation of patient/patient's resources:  Denies Self-Neglect: Denies     Merchant navy officer (For Healthcare) Does Patient Have a Medical Advance Directive?: No Would patient like information on creating a medical advance directive?: No - Patient declined          Disposition: Per Renaye Rakers, NP pt meets criteria for inpt tx. Pt accepted to [facility] after 8am pending negative Covid test. Call to report [phone]. Attending provider will be Dr. Jola Babinski, MD.  Disposition Initial Assessment Completed for this Encounter: Yes Disposition of Patient: Admit Type of inpatient treatment program: Adult Patient refused recommended treatment: No  This service was provided via telemedicine using a 2-way, interactive audio and video technology.  Names of all persons participating in this telemedicine service and their role in this encounter. Name: [name] Role: Patient  Name: Princess Bruins Role: TTS          [name] 03/17/2019 5:43 AM
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
## 2019-03-17 NOTE — ED Notes (Signed)
Pt belongings were inventory. No lockers in purple zone. Belongings place on floor in purple zone and nurse was notified. Inventory sheet is completed and is at nursing station.

## 2019-03-17 NOTE — [facility] Group Notes (Signed)
LCSW Group Therapy Note  03/17/2019 3:31 PM  Type of Therapy/Topic: Group Therapy: Feelings about Diagnosis  Participation Level: Did Not Attend   Description of Group:  This group will allow patients to explore their thoughts and feelings about diagnoses they have received. Patients will be guided to explore their level of understanding and acceptance of these diagnoses. Facilitator will encourage patients to process their thoughts and feelings about the reactions of others to their diagnosis and will guide patients in identifying ways to discuss their diagnosis with significant others in their lives. This group will be process-oriented, with patients participating in exploration of their own experiences, giving and receiving support, and processing challenge from other group members.  Therapeutic Goals: 1. Patient will demonstrate understanding of diagnosis as evidenced by identifying two or more symptoms of the disorder 2. Patient will be able to express two feelings regarding the diagnosis 3. Patient will demonstrate their ability to communicate their needs through discussion and/or role play  Summary of Patient Progress: n/a  Therapeutic Modalities:  Cognitive Behavioral Therapy Brief Therapy Feelings Identification   Stephanie Acre, MSW, [place] Social Worker

## 2019-03-17 NOTE — Progress Notes (Signed)
TTS spoke with Gretta Cool, RN who requested 5 mins to locate cart and place in pt's room.  Lind Covert, MSW, LCSW Therapeutic Triage Specialist  [phone]

## 2019-03-18 MED ORDER — LORAZEPAM 1 MG PO TABS: 1.0000 mg | ORAL_TABLET | ORAL | Status: DC | PRN

## 2019-03-18 MED ORDER — ZIPRASIDONE MESYLATE 20 MG IM SOLR: 20.0000 mg | INTRAMUSCULAR | Status: DC | PRN

## 2019-03-18 MED ORDER — OLANZAPINE 5 MG PO TBDP: 5.0000 mg | ORAL_TABLET | Freq: Three times a day (TID) | ORAL | Status: DC | PRN

## 2019-03-18 NOTE — Progress Notes (Signed)
[facility] MD Progress Note  03/18/2019 4:32 PM [name]  MRN:  [id number] Subjective: Patient describes feeling anxious and depressed regarding psychosocial stressors, mainly financial.  He denies suicidal ideations at this time but continues to endorse a subjective sense of hopelessness regarding his stressors.   Objective: I have reviewed chart notes and I met with patient. 48 year old male, presented to hospital voluntarily on 11/12 reporting depression, neurovegetative symptoms, passive SI.  Reports significant stressors, mainly financial.  States he has been having difficulty affording services and rent to where his power was recently turned off and he is concerned he might lose his home.  He also reports an adult daughter died from Covid related complications in 2022-09-15 . He reports a past history of alcohol use disorder but states he has been sober for several years and in general has been doing better until earlier this year.  He smokes cannabis regularly.  Was not taking any medications prior to admission.  Currently patient is alert, attentive, pleasant on approach.  Remains depressed and ruminative.  He reports significant psychosocial stressors.  States that he has had difficulty paying his bills due to which his electricity was recently disconnected after which his teenaged daughter who was living with him had to move out.  States he is feeling terrible about this and is also concerned that he is going to be addicted due to inability to pay rent.  He also was briefly tearful during session when he spoke about his other daughter's death (died in 2022-09-15) . As above, denies current suicidal ideations and contracts for safety but describes a sense of hopelessness . I have encouraged him to consider an antidepressant medication trial but he remains reluctant to start medication at this time.  States "what I really needed is help finding a way to resolve my situation" (referring to above stressors)  and expresses interest in speaking with CSW to see if there are any services available that may help him. No disruptive or agitated behaviors on unit Staff reports patient has been vaguely irritable/dysphoric with limited milieu participation. Principal Problem:  MDD versus Adjustment Disorder with Depressed Mood , Cannabis Use Disorder Diagnosis: Active Problems:   MDD (major depressive disorder), recurrent episode, severe ([place])  Total Time spent with patient: 15 minutes  Past Psychiatric History:   Past Medical History:  Past Medical History:  Diagnosis Date  . Alcohol abuse   . Anemia   . Colitis   . Severe major depression with psychotic features ([place])    History reviewed. No pertinent surgical history. Family History:  Family History  Problem Relation Age of Onset  . Hypertension Mother   . Hypertension Father   . Hypertension Other    Family Psychiatric  History:  Social History:  Social History   Substance and Sexual Activity  Alcohol Use No   Comment: former  last use1 year ago     Social History   Substance and Sexual Activity  Drug Use Yes  . Frequency: 1.0 times per week  . Types: Marijuana   Comment: former    Social History   Socioeconomic History  . Marital status: Single    Spouse name: Not on file  . Number of children: Not on file  . Years of education: Not on file  . Highest education level: Not on file  Occupational History  . Not on file  Social Needs  . Financial resource strain: Not on file  . Food insecurity    Worry: Not  on file    Inability: Not on file  . Transportation needs    Medical: Not on file    Non-medical: Not on file  Tobacco Use  . Smoking status: Current Every Day Smoker    Packs/day: 0.25    Types: Cigarettes  . Smokeless tobacco: Never Used  Substance and Sexual Activity  . Alcohol use: No    Comment: former  last use1 year ago  . Drug use: Yes    Frequency: 1.0 times per week    Types: Marijuana     Comment: former  . Sexual activity: Yes    Birth control/protection: Condom  Lifestyle  . Physical activity    Days per week: Not on file    Minutes per session: Not on file  . Stress: Not on file  Relationships  . Social Herbalist on phone: Not on file    Gets together: Not on file    Attends religious service: Not on file    Active member of club or organization: Not on file    Attends meetings of clubs or organizations: Not on file    Relationship status: Not on file  Other Topics Concern  . Not on file  Social History Narrative  . Not on file   Additional Social History:   Sleep: Fair  Appetite:  Fair  Current Medications: Current Facility-Administered Medications  Medication Dose Route Frequency Provider Last Rate Last Dose  . alum & mag hydroxide-simeth (MAALOX/MYLANTA) 200-200-20 MG/5ML suspension 30 mL  30 mL Oral Q4H PRN Anike, Adaku C, NP      . hydrOXYzine (ATARAX/VISTARIL) tablet 25 mg  25 mg Oral TID PRN Anike, Adaku C, NP      . magnesium hydroxide (MILK OF MAGNESIA) suspension 30 mL  30 mL Oral Daily PRN Anike, Adaku C, NP      . traZODone (DESYREL) tablet 50 mg  50 mg Oral QHS PRN Anike, Adaku C, NP        Lab Results:  Results for orders placed or performed during the hospital encounter of 03/16/19 (from the past 48 hour(s))  Rapid urine drug screen (hospital performed)     Status: Abnormal   Collection Time: 03/16/19 11:42 PM  Result Value Ref Range   Opiates NONE DETECTED NONE DETECTED   Cocaine NONE DETECTED NONE DETECTED   Benzodiazepines NONE DETECTED NONE DETECTED   Amphetamines NONE DETECTED NONE DETECTED   Tetrahydrocannabinol POSITIVE (A) NONE DETECTED   Barbiturates NONE DETECTED NONE DETECTED    Comment: (NOTE) DRUG SCREEN FOR MEDICAL PURPOSES ONLY.  IF CONFIRMATION IS NEEDED FOR ANY PURPOSE, NOTIFY LAB WITHIN 5 DAYS. LOWEST DETECTABLE LIMITS FOR URINE DRUG SCREEN Drug Class                     Cutoff (ng/mL) Amphetamine  and metabolites    1000 Barbiturate and metabolites    200 Benzodiazepine                 736 Tricyclics and metabolites     300 Opiates and metabolites        300 Cocaine and metabolites        300 THC                            50 Performed at [facility], [place] [address], [place], [place] [address]   Comprehensive metabolic panel  Status: Abnormal   Collection Time: 03/16/19 11:49 PM  Result Value Ref Range   Sodium 141 135 - 145 mmol/L   Potassium 4.2 3.5 - 5.1 mmol/L   Chloride 102 98 - 111 mmol/L   CO2 29 22 - 32 mmol/L   Glucose, Bld 76 70 - 99 mg/dL   BUN 12 6 - 20 mg/dL   Creatinine, Ser 0.95 0.61 - 1.24 mg/dL   Calcium 9.6 8.9 - 10.3 mg/dL   Total Protein 6.9 6.5 - 8.1 g/dL   Albumin 4.2 3.5 - 5.0 g/dL   AST 16 15 - 41 U/L   ALT 11 0 - 44 U/L   Alkaline Phosphatase 34 (L) 38 - 126 U/L   Total Bilirubin 0.8 0.3 - 1.2 mg/dL   GFR calc non Af Amer >60 >60 mL/min   GFR calc Af Amer >60 >60 mL/min   Anion gap 10 5 - 15    Comment: Performed at [facility], [address], [place], [place] [address]  Ethanol     Status: None   Collection Time: 03/16/19 11:49 PM  Result Value Ref Range   Alcohol, Ethyl (B) <10 <10 mg/dL    Comment: (NOTE) Lowest detectable limit for serum alcohol is 10 mg/dL. For medical purposes only. Performed at [facility], [place] [address] [place], [place] [address]   Salicylate level     Status: None   Collection Time: 03/16/19 11:49 PM  Result Value Ref Range   Salicylate Lvl <9.4 2.8 - 30.0 mg/dL    Comment: Performed at [place] [address], [place], [place] [address]  Acetaminophen level     Status: Abnormal   Collection Time: 03/16/19 11:49 PM  Result Value Ref Range   Acetaminophen (Tylenol), Serum <10 (L) 10 - 30 ug/mL    Comment: (NOTE) Therapeutic concentrations vary significantly. A range of 10-30 ug/mL  may be an effective concentration for many patients. However, some  are best treated at  concentrations outside of this range. Acetaminophen concentrations >150 ug/mL at 4 hours after ingestion  and >50 ug/mL at 12 hours after ingestion are often associated with  toxic reactions. Performed at [facility], [place] [address], [place], [place] [address]   cbc     Status: Abnormal   Collection Time: 03/16/19 11:49 PM  Result Value Ref Range   WBC 5.7 4.0 - 10.5 K/uL   RBC 4.02 (L) 4.22 - 5.81 MIL/uL   Hemoglobin 12.4 (L) 13.0 - 17.0 g/dL   HCT 38.5 (L) 39.0 - 52.0 %   MCV 95.8 80.0 - 100.0 fL   MCH 30.8 26.0 - 34.0 pg   MCHC 32.2 30.0 - 36.0 g/dL   RDW 13.1 11.5 - 15.5 %   Platelets 233 150 - 400 K/uL   nRBC 0.0 0.0 - 0.2 %    Comment: Performed at [facility], [place] [address], [place], [place] [address]  Troponin I (High Sensitivity)     Status: None   Collection Time: 03/16/19 11:49 PM  Result Value Ref Range   Troponin I (High Sensitivity) 4 <18 ng/L    Comment: (NOTE) Elevated high sensitivity troponin I (hsTnI) values and significant  changes across serial measurements may suggest ACS but many other  chronic and acute conditions are known to elevate hsTnI results.  Refer to the "Links" section for chest pain algorithms and additional  guidance. Performed at [facility], [place]  [address], [place], [place] [address]   SARS Coronavirus 2 by RT PCR (hospital order, performed in [facility] hospital lab) Nasopharyngeal Nasopharyngeal Swab     Status: None   Collection Time: 03/17/19  4:27 AM   Specimen: Nasopharyngeal Swab  Result Value Ref Range   SARS Coronavirus 2 NEGATIVE NEGATIVE    Comment: (NOTE) If result is NEGATIVE SARS-CoV-2 target nucleic acids are NOT DETECTED. The SARS-CoV-2 RNA is generally detectable in upper and lower  respiratory specimens during the acute phase of infection. The lowest  concentration of SARS-CoV-2 viral copies this assay can detect is 250  copies / mL. A negative result does not preclude SARS-CoV-2 infection   and should not be used as the sole basis for treatment or other  patient management decisions.  A negative result may occur with  improper specimen collection / handling, submission of specimen other  than nasopharyngeal swab, presence of viral mutation(s) within the  areas targeted by this assay, and inadequate number of viral copies  (<250 copies / mL). A negative result must be combined with clinical  observations, patient history, and epidemiological information. If result is POSITIVE SARS-CoV-2 target nucleic acids are DETECTED. The SARS-CoV-2 RNA is generally detectable in upper and lower  respiratory specimens dur ing the acute phase of infection.  Positive  results are indicative of active infection with SARS-CoV-2.  Clinical  correlation with patient history and other diagnostic information is  necessary to determine patient infection status.  Positive results do  not rule out bacterial infection or co-infection with other viruses. If result is PRESUMPTIVE POSTIVE SARS-CoV-2 nucleic acids MAY BE PRESENT.   A presumptive positive result was obtained on the submitted specimen  and confirmed on repeat testing.  While 2019 novel coronavirus  (SARS-CoV-2) nucleic acids may be present in the submitted sample  additional confirmatory testing may be necessary for epidemiological  and / or clinical management purposes  to differentiate between  SARS-CoV-2 and other Sarbecovirus currently known to infect humans.  If clinically indicated additional testing with an alternate test  methodology [id number]) is advised. The SARS-CoV-2 RNA is generally  detectable in upper and lower respiratory sp ecimens during the acute  phase of infection. The expected result is Negative. Fact Sheet for Patients:  StrictlyIdeas.no Fact Sheet for Healthcare Providers: BankingDealers.co.za This test is not yet approved or cleared by the [place] FDA  and has been authorized for detection and/or diagnosis of SARS-CoV-2 by FDA under an Emergency Use Authorization (EUA).  This EUA will remain in effect (meaning this test can be used) for the duration of the COVID-19 declaration under Section 564(b)(1) of the Act, 21 U.S.C. section 360bbb-3(b)(1), unless the authorization is terminated or revoked sooner. Performed at [facility], [place] [address] [place], [place] [address]     Blood Alcohol level:  Lab Results  Component Value Date   [facility] <10 03/16/2019   ETH <5 62/22/9798    Metabolic Disorder Labs: Lab Results  Component Value Date   HGBA1C 5.2 08/27/2014   MPG 103 08/27/2014   MPG 105 03/08/2013   No results found for: PROLACTIN No results found for: CHOL, TRIG, HDL, CHOLHDL, VLDL, LDLCALC  Physical Findings: AIMS: Facial and Oral Movements Muscles of Facial Expression: None, normal Lips and Perioral Area: None, normal Jaw: None, normal Tongue: None, normal,Extremity Movements Upper (arms, wrists, hands, fingers): None, normal Lower (legs, knees, ankles, toes): None, normal, Trunk Movements Neck, shoulders, hips: None, normal, Overall Severity Severity of  abnormal movements (highest score from questions above): None, normal Incapacitation due to abnormal movements: None, normal Patient's awareness of abnormal movements (rate only patient's report): No Awareness, Dental Status Current problems with teeth and/or dentures?: No Does patient usually wear dentures?: No  CIWA:    COWS:     Musculoskeletal: Strength & Muscle Tone: within normal limits Gait & Station: normal Patient leans: N/A  Psychiatric Specialty Exam: Physical Exam  ROS denies headache or chest pain or shortness of breath  Blood pressure 112/88, pulse 85, temperature 98.2 F (36.8 C), temperature source Oral, resp. rate 16, height 5' 7"  (1.702 m), weight 67.1 kg.Body mass index is 23.18 kg/m.  General Appearance: Fairly Groomed  Eye  Contact:  Good  Speech:  Normal Rate  Volume:  Normal  Mood:  Remains depressed and vaguely dysphoric  Affect:  Congruent  Thought Process:  Linear and Descriptions of Associations: Intact  Orientation:  Other:  Fully alert and attentive  Thought Content:  No hallucinations, no delusions, ruminative about stressors  Suicidal Thoughts:  No currently denies suicidal ideations/contracts for safety on unit  Homicidal Thoughts:  No  Memory:  Recent and remote grossly intact  Judgement:  Fair  Insight:  Fair  Psychomotor Activity:  Normal  Concentration:  Concentration: Good and Attention Span: Good  Recall:  Good  Fund of Knowledge:  Good  Language:  Good  Akathisia:  Negative  Handed:  Right  AIMS (if indicated):     Assets:  Desire for Improvement Resilience  ADL's:  Intact  Cognition:  WNL  Sleep:      Assessment:  48 year old male, presented to hospital voluntarily on 11/12 reporting depression, neurovegetative symptoms, passive SI.  Reports significant stressors, mainly financial.  States he has been having difficulty affording services and rent to where his power was recently turned off and he is concerned he might lose his home.  He also reports an adult daughter died from Covid related complications in April . He reports a past history of alcohol use disorder but states he has been sober for several years and in general has been doing better until earlier this year.  He smokes cannabis regularly.  Was not taking any medications prior to admission.  Patient remains depressed, vaguely irritable today, denying SI but ruminative about stressors and expressing a subjective sense of hopelessness regarding these (financial difficulties, unable to pay home services resulting in electricity being turned off, concerned he will lose 1/become homeless) Currently not interested in an antidepressant medication trial.  Interested in exploring possible sources of aid/assistance with  CSW  Treatment Plan Summary: Daily contact with patient to assess and evaluate symptoms and progress in treatment, Medication management, Plan Inpatient treatment and Medications as below Encourage group and milieu participation Encourage efforts to work on sobriety and relapse prevention Continue Vistaril 25 mg 3 times daily as needed for anxiety Continue trazodone 50 mg nightly as needed for insomnia Agitation protocol for acute agitation if needed  Jenne Campus, MD 03/18/2019, 4:32 PM

## 2019-03-18 NOTE — [facility] Group Notes (Signed)
[facility] Group Notes:  (Nursing/MHT/Case Management/Adjunct)  Date:  03/18/2019  Time:  1345  Type of Therapy:  Nurse Education  Participation Level:  Did Not Attend  Yaffa Seckman L 03/18/2019, 2:27 PM 

## 2019-03-18 NOTE — Progress Notes (Signed)
Adult Psychoeducational Group Note  Date:  03/18/2019 Time:  9:27 PM  Group Topic/Focus:  Wrap-Up Group:   The focus of this group is to help patients review their daily goal of treatment and discuss progress on daily workbooks.  Participation Level:  Active  Participation Quality:  Appropriate  Affect:  Appropriate  Cognitive:  Appropriate  Insight: Appropriate and Good  Engagement in Group:  Engaged  Modes of Intervention:  Discussion  Additional Comments:  Pt said his day was a 4. His goal was to receive helpful resources. He did not achieve his goal. The coping skills he found to be most helpful was praying. The concern he has to speak to someone than can assist. A counselor pastor or someone.  Lenice Llamas Long 03/18/2019, 9:27 PM

## 2019-03-18 NOTE — Progress Notes (Signed)
   03/18/19 0100  Psych Admission Type (Psych Patients Only)  Admission Status Voluntary  Psychosocial Assessment  Patient Complaints Anxiety  Eye Contact Fair  Facial Expression Animated  Affect Depressed;Irritable  Speech Logical/coherent  Interaction Assertive  Motor Activity Other (Comment) (WDL)  Appearance/Hygiene In hospital gown  Behavior Characteristics Anxious  Mood Depressed  Thought Process  Coherency WDL  Content WDL  Delusions None reported or observed  Perception WDL  Hallucination None reported or observed  Judgment Poor  Confusion None  Danger to Self  Current suicidal ideation? Denies  Danger to Others  Danger to Others None reported or observed   Pt visible in the dayroom some this evening. Pt had minimal interaction

## 2019-03-18 NOTE — Progress Notes (Signed)
Pt presented with a flat affect and anxious mood. Pt noted to be irritable and easily agitated on approach. Pt denied SI/HI. Pt expressed financial difficulty and possibly facing eviction. The pt expressed to writer that he doesn't need to take medications because he's not depressed. However, the pt rated depression on his self inventory sheet 7/0 and anxiety 0.  Orders reviewed. V/s assessed. Verbal support provided. 15 minute checks performed for safety.

## 2019-03-18 NOTE — [facility] Group Notes (Signed)
LCSW Group Therapy Note  Date/Time:  03/18/2019   10:00AM-11:00AM  Type of Therapy and Topic:  Group Therapy:  Fears and Unhealthy/Healthy Coping Skills  Participation Level:  Active   Description of Group:  The focus of this group was to discuss some of the prevalent fears that patients experience, and to identify the commonalities among group members.  A fun exercise was used to initiate the discussion, followed by writing on the white board a group-generated list of unhealthy coping and healthy coping techniques to deal with each fear.    Therapeutic Goals: 1. Patient will be able to distinguish between healthy and unhealthy coping skills 2. Patient will identify and describe 3 fears they experience 3. Patient will identify one positive coping strategy for each fear they experience 4. Patient will respond empathetically to peers' statements regarding fears they experience  Summary of Patient Progress:  The patient expressed himself freely throughout group, was one of the more vocal group members.  His faith and prayer is one positive coping strategies he deals with, including the recent death of his daughter from COVID-66.  Therapeutic Modalities Cognitive Behavioral Therapy Motivational Interviewing  Selmer Dominion, LCSW

## 2019-03-18 NOTE — Progress Notes (Signed)
   03/18/19 0833  Psych Admission Type (Psych Patients Only)  Admission Status Voluntary  Psychosocial Assessment  Patient Complaints Anxiety;Depression;Hopelessness  Eye Contact Fair  Facial Expression Sad;Anxious  Affect Appropriate to circumstance  Speech Logical/coherent  Interaction Assertive  Motor Activity Fidgety  Appearance/Hygiene Disheveled  Behavior Characteristics Anxious;Agitated  Mood Anxious;Depressed  Aggressive Behavior  Effect No apparent injury  Thought Process  Coherency WDL  Content Blaming others  Delusions None reported or observed  Perception WDL  Hallucination None reported or observed  Judgment Poor  Confusion None  Danger to Self  Current suicidal ideation? Denies  Danger to Others  Danger to Others None reported or observed

## 2019-03-19 ENCOUNTER — Encounter (HOSPITAL_COMMUNITY): Payer: Self-pay | Admitting: Clinical
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
## 2019-03-19 NOTE — [facility] Group Notes (Signed)
[facility] LCSW Group Therapy Note  03/19/2019  10:00-11:00AM  Type of Therapy and Topic:  Group Therapy:  A Hero Worthy of Support  Participation Level:  Active   Description of Group:  Patients in this group were introduced to the concept that additional supports including self-support are an essential part of recovery.  Matching needs with supports to help fulfill those needs was explained.  A song "I Got To Live" was played for the group and was followed by a discussion of what it meant to participants.   The consensus was that the message was to give themselves permission to see happiness in life.  A song entitled "My Own Hero" was played and a group discussion ensued in which patients stated it inspired them to help themselves in order to succeed, because other people cannot achieve their goals such as sobriety or stability for them.  A song was played called "I Am Enough" which led to a discussion about being willing to believe we are worth the effort of being a self-support.   Therapeutic Goals: 1)  demonstrate the importance of being a key part of one's own support system 2)  discuss various available supports 3)  encourage patient to use music as part of their self-support and focus on goals 4)  elicit ideas from patients about supports that need to be added   Summary of Patient Progress:  The patient expressed little during group but he was attentive and appeared to be fully engaged.  Therapeutic Modalities:   Motivational Interviewing Activity  [name]
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
## 2019-03-19 NOTE — Progress Notes (Signed)
   03/19/19 0846  Psych Admission Type (Psych Patients Only)  Admission Status Voluntary  Psychosocial Assessment  Patient Complaints Anxiety;Depression  Eye Contact Fair  Facial Expression Flat  Affect Appropriate to circumstance  Speech Logical/coherent  Interaction Assertive  Motor Activity Fidgety  Appearance/Hygiene Disheveled  Behavior Characteristics Irritable;Cooperative  Mood Labile  Aggressive Behavior  Effect No apparent injury  Thought Process  Coherency WDL  Content Blaming others  Delusions None reported or observed  Perception WDL  Hallucination None reported or observed  Judgment Poor  Confusion None  Danger to Self  Current suicidal ideation? Denies  Danger to Others  Danger to Others None reported or observed

## 2019-03-19 NOTE — BHH Suicide Risk Assessment (Signed)
[facility] INPATIENT:  Family/Significant Other Suicide Prevention Education  Suicide Prevention Education:  Patient Refusal for Family/Significant Other Suicide Prevention Education: The patient [name] has refused to provide written consent for family/significant other to be provided Family/Significant Other Suicide Prevention Education during admission and/or prior to discharge.  Physician notified.  Berlin Hun Grossman-Orr 03/19/2019, 9:24 AM

## 2019-03-20 NOTE — [facility] Suicide Risk Assessment (Signed)
[facility] Discharge Suicide Risk Assessment   Principal Problem: depression Discharge Diagnoses: Active Problems:   MDD (major depressive disorder), recurrent episode, severe ([place])   Total Time spent with patient: 30 minutes  Musculoskeletal: Strength & Muscle Tone: within normal limits Gait & Station: normal Patient leans: N/A  Psychiatric Specialty Exam: ROS  Reports headaches, no chest pain, no shortness of breath, no vomiting   Blood pressure 130/86, pulse 73, temperature 98.1 F (36.7 C), temperature source Oral, resp. rate 16, height 5\' 7"  (1.702 m), weight 67.1 kg, SpO2 100 %.Body mass index is 23.18 kg/m.  General Appearance: Well Groomed  Eye Contact::  Good  Speech:  Normal Rate409  Volume:  Normal  Mood:  reports mood is " all right"  Affect:  reactive  Thought Process:  Linear and Descriptions of Associations: Intact  Orientation:  Full (Time, Place, and Person)  Thought Content:  no hallucinations, no delusions   Suicidal Thoughts:  No denies suicidal or self injurious ideations, no homicidal or violent ideations  Homicidal Thoughts:  No  Memory:  recent and remote grossly intact   Judgement:  Other:  fair- improving   Insight:  fair- improving   Psychomotor Activity:  Normal  Concentration:  Good  Recall:  Good  Fund of Knowledge:Good  Language: Good  Akathisia:  Negative  Handed:  Right  AIMS (if indicated):     Assets:  Desire for Improvement Resilience  Sleep:  Number of Hours: 3.75  Cognition: WNL  ADL's:  Intact   Mental Status Per Nursing Assessment::   On Admission:  Suicidal ideation indicated by patient  Demographic Factors:  48,two surviving daughters, lives alone , currently unemployed   Loss Factors: Unemployment, financial difficulties, unable to pay utilities, electricity recently turned off, concerned about becoming homeless if unable to pay rent   Historical Factors: History of psychiatric admissions, most recently 2016. History of  depression, history of alcohol use disorder   Risk Reduction Factors:   Responsible for children under 60 years of age, Sense of responsibility to family and Positive coping skills or problem solving skills  Continued Clinical Symptoms:  Alert,attentive, calm, mood is reported as partially improved . Denies suicidal or self injurious ideations, presents future oriented, expressing plan of seeking resources in order to obtain financial help to pay utilities . Plans also to look for a job. No psychotic symptoms. Currently not on any standing psychiatric medications.  Cognitive Features That Contribute To Risk:  No gross cognitive deficits noted upon discharge. Is alert , attentive, and oriented x 3   Suicide Risk:  Mild:  Suicidal ideation of limited frequency, intensity, duration, and specificity.  There are no identifiable plans, no associated intent, mild dysphoria and related symptoms, good self-control (both objective and subjective assessment), few other risk factors, and identifiable protective factors, including available and accessible social support.  Follow-up Information    Patient declined. Follow up.   Contact information: Patient declined outpatient referrals.        [facility] Follow up.   Specialty: Professional Counselor Why: Please follow up with clinic for outpatient services during walk-in hours; Monday-Friday 8:30a.-12:00p and 1:00p-2:30p. Be sure to bring the following; photo ID, insurance card, SSN, current medications and discharge paperwork from this hospitalization. Contact information: [facility] [place] [address] [phone]           Plan Of Care/Follow-up recommendations:  Activity:  as tolerated  Diet:  regular  Tests:  NA Other:  See below  Patient is expressing readiness for discharge, plans to return home. Follow up as above. I also assisted patient in placing a phone  call to a governmental agency that provides relief for persons facing financial distress . Message was left requesting call back to his cell phone number.   Craige Cotta, MD 03/20/2019, 12:39 PM

## 2019-03-20 NOTE — Progress Notes (Signed)
[facility] Group Notes:  (Nursing/MHT/Case Management/Adjunct)  Date:  03/19/2019  Time:  2030  Type of Therapy:  wrap up group  Participation Level:  Active  Participation Quality:  Appropriate, Attentive, Sharing and Supportive  Affect:  Appropriate  Cognitive:  Appropriate  Insight:  Improving  Engagement in Group:  Engaged  Modes of Intervention:  Clarification, Education and Support  Summary of Progress/Problems:  Pt reported plans to try and quit smoking marijuana. Pt shared he was grateful for his two children and grieving over loss of eldest daughter last April.   Shellia Cleverly 03/20/2019, 12:36 AM

## 2019-03-20 NOTE — Tx Team (Signed)
Interdisciplinary Treatment and Diagnostic Plan Update  03/20/2019 Time of Session: 9:20am [name] MRN: [id number]  Principal Diagnosis: <principal problem not specified>  Secondary Diagnoses: Active Problems:   MDD (major depressive disorder), recurrent episode, severe (HCC)   Current Medications:  Current Facility-Administered Medications  Medication Dose Route Frequency Provider Last Rate Last Dose  . alum & mag hydroxide-simeth (MAALOX/MYLANTA) 200-200-20 MG/5ML suspension 30 mL  30 mL Oral Q4H PRN Anike, Adaku C, NP      . hydrOXYzine (ATARAX/VISTARIL) tablet 25 mg  25 mg Oral TID PRN Anike, Adaku C, NP      . OLANZapine zydis (ZYPREXA) disintegrating tablet 5 mg  5 mg Oral Q8H PRN Cobos, Rockey Situ, MD       And  . LORazepam (ATIVAN) tablet 1 mg  1 mg Oral PRN Cobos, Rockey Situ, MD       And  . ziprasidone (GEODON) injection 20 mg  20 mg Intramuscular PRN Cobos, Rockey Situ, MD      . magnesium hydroxide (MILK OF MAGNESIA) suspension 30 mL  30 mL Oral Daily PRN Anike, Adaku C, NP      . traZODone (DESYREL) tablet 50 mg  50 mg Oral QHS PRN Anike, Adaku C, NP       PTA Medications: No medications prior to admission.    Patient Stressors: Financial difficulties Loss of daughter passed away  Patient Strengths: Ability for insight Capable of independent living General fund of knowledge Physical Health  Treatment Modalities: Medication Management, Group therapy, Case management,  1 to 1 session with clinician, Psychoeducation, Recreational therapy.   Physician Treatment Plan for Primary Diagnosis: <principal problem not specified> Long Term Goal(s): Improvement in symptoms so as ready for discharge Improvement in symptoms so as ready for discharge   Short Term Goals: Ability to identify changes in lifestyle to reduce recurrence of condition will improve Ability to verbalize feelings will improve Ability to disclose and discuss suicidal ideas Ability to  demonstrate self-control will improve Ability to identify and develop effective coping behaviors will improve Ability to maintain clinical measurements within normal limits will improve Ability to identify changes in lifestyle to reduce recurrence of condition will improve Ability to verbalize feelings will improve Ability to disclose and discuss suicidal ideas Ability to demonstrate self-control will improve Ability to identify and develop effective coping behaviors will improve Ability to maintain clinical measurements within normal limits will improve  Medication Management: Evaluate patient's response, side effects, and tolerance of medication regimen.  Therapeutic Interventions: 1 to 1 sessions, Unit Group sessions and Medication administration.  Evaluation of Outcomes: Adequate for Discharge  Physician Treatment Plan for Secondary Diagnosis: Active Problems:   MDD (major depressive disorder), recurrent episode, severe (HCC)  Long Term Goal(s): Improvement in symptoms so as ready for discharge Improvement in symptoms so as ready for discharge   Short Term Goals: Ability to identify changes in lifestyle to reduce recurrence of condition will improve Ability to verbalize feelings will improve Ability to disclose and discuss suicidal ideas Ability to demonstrate self-control will improve Ability to identify and develop effective coping behaviors will improve Ability to maintain clinical measurements within normal limits will improve Ability to identify changes in lifestyle to reduce recurrence of condition will improve Ability to verbalize feelings will improve Ability to disclose and discuss suicidal ideas Ability to demonstrate self-control will improve Ability to identify and develop effective coping behaviors will improve Ability to maintain clinical measurements within normal limits will improve     Medication  Management: Evaluate patient's response, side effects, and  tolerance of medication regimen.  Therapeutic Interventions: 1 to 1 sessions, Unit Group sessions and Medication administration.  Evaluation of Outcomes: Adequate for Discharge   RN Treatment Plan for Primary Diagnosis: <principal problem not specified> Long Term Goal(s): Knowledge of disease and therapeutic regimen to maintain health will improve  Short Term Goals: Ability to participate in decision making will improve, Ability to verbalize feelings will improve, Ability to disclose and discuss suicidal ideas and Ability to identify and develop effective coping behaviors will improve  Medication Management: RN will administer medications as ordered by provider, will assess and evaluate patient's response and provide education to patient for prescribed medication. RN will report any adverse and/or side effects to prescribing provider.  Therapeutic Interventions: 1 on 1 counseling sessions, Psychoeducation, Medication administration, Evaluate responses to treatment, Monitor vital signs and CBGs as ordered, Perform/monitor CIWA, COWS, AIMS and Fall Risk screenings as ordered, Perform wound care treatments as ordered.  Evaluation of Outcomes: Adequate for Discharge   LCSW Treatment Plan for Primary Diagnosis: <principal problem not specified> Long Term Goal(s): Safe transition to appropriate next level of care at discharge, Engage patient in therapeutic group addressing interpersonal concerns.  Short Term Goals: Engage patient in aftercare planning with referrals and resources  Therapeutic Interventions: Assess for all discharge needs, 1 to 1 time with Social worker, Explore available resources and support systems, Assess for adequacy in community support network, Educate family and significant other(s) on suicide prevention, Complete Psychosocial Assessment, Interpersonal group therapy.  Evaluation of Outcomes: Adequate for Discharge   Progress in Treatment: Attending groups:  Yes. Participating in groups: Yes. Taking medication as prescribed: Yes. Toleration medication: Yes. Family/Significant other contact made: No, will contact:  patient declined collateral contacts Patient understands diagnosis: Yes. Discussing patient identified problems/goals with staff: Yes. Medical problems stabilized or resolved: Yes. Denies suicidal/homicidal ideation: Yes. Issues/concerns per patient self-inventory: No. Other:   New problem(s) identified: None   New Short Term/Long Term Goal(s):medication stabilization, elimination of SI thoughts, development of comprehensive mental wellness plan.    Patient Goals:  "Nothing"   Discharge Plan or Barriers: Patient plans to return home and will follow up with [facility] for outpatient medication management and therapy services.   Reason for Continuation of Hospitalization: Depression  Estimated Length of Stay: 3-5 days   Attendees: Patient: [name]  03/20/2019 10:31 AM  Physician: Dr. Neita Garnet, MD 03/20/2019 10:31 AM  Nursing: Elberta Fortis.A, RN 03/20/2019 10:31 AM  RN Care Manager: 03/20/2019 10:31 AM  Social Worker: Radonna Ricker, LCSW 03/20/2019 10:31 AM  Recreational Therapist:  03/20/2019 10:31 AM  Other:  03/20/2019 10:31 AM  Other:  03/20/2019 10:31 AM  Other: 03/20/2019 10:31 AM    Scribe for Treatment Team: Marylee Floras, [place] 03/20/2019 10:31 AM

## 2019-03-20 NOTE — Progress Notes (Signed)
  [facility] Adult Case Management Discharge Plan :  Will you be returning to the same living situation after discharge:  Yes,  home. At discharge, do you have transportation home?: Yes,  Lyft at 11:30am. Do you have the ability to pay for your medications: No. Declined referrals.  Release of information consent forms completed and in the chart. Letters on chart. Resources provided to patient.   Patient to Follow up at: Follow-up Information    Patient declined. Follow up.   Contact information: Patient declined outpatient referrals.        [facility] Follow up.   Specialty: Professional Counselor Why: Please follow up with clinic for outpatient services during walk-in hours; Monday-Friday 8:30a.-12:00p and 1:00p-2:30p. Be sure to bring the following; photo ID, insurance card, SSN, current medications and discharge paperwork from this hospitalization. Contact information: [facility] [place] [address] [phone]           Next level of care provider has access to [place] and Suicide Prevention discussed: Yes,  with patient.  Have you used any form of tobacco in the last 30 days? (Cigarettes, Smokeless Tobacco, Cigars, and/or Pipes): Yes  Has patient been referred to the Quitline?: Patient refused referral  Patient has been referred for addiction treatment: Pt. refused referral  Joellen Jersey, [place] 03/20/2019, 10:05 AM

## 2019-03-20 NOTE — Discharge Summary (Addendum)
Physician Discharge Summary Note  Patient:  [name] is an 48 y.o., male MRN:  [id number] DOB:  1971/01/19 Patient phone:  [phone] (home)  Patient address:   [place] [address],  Total Time spent with patient: 15 minutes  Date of Admission:  03/17/2019 Date of Discharge: 03/20/19  Reason for Admission:  suicidal ideation  Principal Problem: <principal problem not specified> Discharge Diagnoses: Active Problems:   MDD (major depressive disorder), recurrent episode, severe ([place])   Past Psychiatric History: history of prior psychiatric admissions , most recently in 2016 . At the time presented for depression, substance abuse .  (He reports that he has been sober/abstinent from alcohol for several years. Uses cannabis regularly). No history of suicide attempts .  Reports remote history of auditory hallucinations but not " in many years. I think it had to do with my drinking back then". Reports history of depression, which he attributes at least in part to significant psychosocial stressors.  Past Medical History:  Past Medical History:  Diagnosis Date  . Alcohol abuse   . Anemia   . Colitis   . Severe major depression with psychotic features ([place])    History reviewed. No pertinent surgical history. Family History:  Family History  Problem Relation Age of Onset  . Hypertension Mother   . Hypertension Father   . Hypertension Other    Family Psychiatric  History: reports both parents died from complications of alcohol use disorder . No suicides in family  Social History:  Social History   Substance and Sexual Activity  Alcohol Use No   Comment: former  last use1 year ago     Social History   Substance and Sexual Activity  Drug Use Yes  . Frequency: 1.0 times per week  . Types: Marijuana   Comment: former    Social History   Socioeconomic History  . Marital status: Single    Spouse name: Not on file  . Number of children: Not  on file  . Years of education: Not on file  . Highest education level: Not on file  Occupational History  . Not on file  Social Needs  . Financial resource strain: Very hard  . Food insecurity    Worry: Sometimes true    Inability: Sometimes true  . Transportation needs    Medical: No    Non-medical: No  Tobacco Use  . Smoking status: Current Every Day Smoker    Packs/day: 0.25    Types: Cigarettes  . Smokeless tobacco: Never Used  Substance and Sexual Activity  . Alcohol use: No    Comment: former  last use1 year ago  . Drug use: Yes    Frequency: 1.0 times per week    Types: Marijuana    Comment: former  . Sexual activity: Yes    Birth control/protection: Condom  Lifestyle  . Physical activity    Days per week: Not on file    Minutes per session: Not on file  . Stress: Not on file  Relationships  . Social Herbalist on phone: Not on file    Gets together: Not on file    Attends religious service: Not on file    Active member of club or organization: Not on file    Attends meetings of clubs or organizations: Not on file    Relationship status: Not on file  Other Topics Concern  . Not on file  Social History Narrative  .  Not on file    Hospital Course:  From admission H&P: [age] year old male, presented to hospital voluntarily on 11/12 . Reported worsening  depression and recent suicidal ideations, which he currently describes as passive , without specific plan. States " I just felt tired, like giving up, like what's the use anymore". He describes multiple contributing stressors, including financial difficulties, having difficulty affording services and rent.States his power was turned off earlier this week. Also reports his young adult daughter died from COVID related complications in April. States " I feel like I have been screwed up since then, feel like I can't get ahead".  Endorses neuro-vegetative symptoms as below.  Mr. [name] was admitted for  depression with suicidal ideation. He reported increased financial difficulties recently with job loss related to COVID. He recently had his power cut off and is struggling to make his rent. He was focused on finding financial assistance during hospitalization and identified financial problems as main trigger for depression. He declined psychotropic medications. He participated in group therapy on the unit. He has shown improved mood, affect, sleep, and interaction. He denies any SI/HI/AVH and contracts for safety. He is requesting discharge. [facility]see [facility] He is discharging home via Lyft.   Physical Findings: AIMS: Facial and Oral Movements Muscles of Facial Expression: None, normal Lips and Perioral Area: None, normal Jaw: None, normal Tongue: None, normal,Extremity Movements Upper (arms, wrists, hands, fingers): None, normal Lower (legs, knees, ankles, toes): None, normal, Trunk Movements Neck, shoulders, hips: None, normal, Overall Severity Severity of abnormal movements (highest score from questions above): None, normal Incapacitation due to abnormal movements: None, normal Patient's awareness of abnormal movements (rate only patient's report): No Awareness, Dental Status Current problems with teeth and/or dentures?: No Does patient usually wear dentures?: No  CIWA:    COWS:     Musculoskeletal: Strength & Muscle Tone: within normal limits Gait & Station: normal Patient leans: N/A  Psychiatric Specialty Exam: Physical Exam  Nursing note and vitals reviewed. Constitutional: He is oriented to person, place, and time. He appears well-developed and well-nourished.  Cardiovascular: Normal rate.  Respiratory: Effort normal.  Neurological: He is alert and oriented to person, place, and time.    Review of Systems  Constitutional: Negative.   Respiratory: Negative for cough and shortness of breath.   Cardiovascular: Negative for  chest pain.  Psychiatric/Behavioral: Positive for depression and substance abuse (THC). Negative for hallucinations and suicidal ideas. The patient is not nervous/anxious and does not have insomnia.     Blood pressure 130/86, pulse 73, temperature 98.1 F (36.7 C), temperature source Oral, resp. rate 16, height 5\' 7"  (1.702 m), weight 67.1 kg, SpO2 100 %.Body mass index is 23.18 kg/m.  See MD's discharge SRA   Have you used any form of tobacco in the last 30 days? (Cigarettes, Smokeless Tobacco, Cigars, and/or Pipes): Yes  Has this patient used any form of tobacco in the last 30 days? (Cigarettes, Smokeless Tobacco, Cigars, and/or Pipes)  No  Blood Alcohol level:  Lab Results  Component Value Date   ETH <10 03/16/2019   ETH <5 06/08/2014    Metabolic Disorder Labs:  Lab Results  Component Value Date   HGBA1C 5.2 08/27/2014   MPG 103 08/27/2014   MPG 105 03/08/2013   No results found for: PROLACTIN No results found for: CHOL, TRIG, HDL, CHOLHDL, VLDL, LDLCALC  See Psychiatric Specialty Exam and Suicide Risk Assessment completed by  Attending Physician prior to discharge.  Discharge destination:  Home  Is patient on multiple antipsychotic therapies at discharge:  No   Has Patient had three or more failed trials of antipsychotic monotherapy by history:  No  Recommended Plan for Multiple Antipsychotic Therapies: NA  Discharge Instructions    Discharge instructions   Complete by: As directed    Patient is instructed to take all prescribed medications as recommended. Report any side effects or adverse reactions to your outpatient psychiatrist. Patient is instructed to abstain from alcohol and illegal drugs while on prescription medications. In the event of worsening symptoms, patient is instructed to call the crisis hotline, 911, or go to the nearest emergency department for evaluation and treatment.     Allergies as of 03/20/2019      Reactions   Acetaminophen Nausea Only    Aspirin Hives, Nausea And Vomiting   Ibuprofen Hives, Nausea And Vomiting   Pork-derived Products Other (See Comments)   Stomach pain    Sertraline Diarrhea, Other (See Comments)   Acute GI distress with diarrhea and pain.       Medication List    You have not been prescribed any medications.    Follow-up Information    Patient declined. Follow up.   Contact information: Patient declined outpatient referrals.        [facility] Follow up.   Specialty: Professional Counselor Why: Please follow up with clinic for outpatient services during walk-in hours; Monday-Friday 8:30a.-12:00p and 1:00p-2:30p. Be sure to bring the following; photo ID, insurance card, SSN, current medications and discharge paperwork from this hospitalization. Contact information: [facility] [place] [address] [place] [address] [phone]           Follow-up recommendations: Activity as tolerated. Diet as recommended by primary care physician. Keep all scheduled follow-up appointments as recommended.   Comments:   Patient is instructed to take all prescribed medications as recommended. Report any side effects or adverse reactions to your outpatient psychiatrist. Patient is instructed to abstain from alcohol and illegal drugs while on prescription medications. In the event of worsening symptoms, patient is instructed to call the crisis hotline, 911, or go to the nearest emergency department for evaluation and treatment.  Signed: Aldean Baker, NP 03/20/2019, 3:47 PM   Patient seen, Suicide Assessment Completed.  Disposition Plan Reviewed

## 2019-03-20 NOTE — Progress Notes (Signed)
Patient ID: [name], male   DOB: [birth year], 48 y.o.   MRN: [id number] Patient discharged to home/self care in the presence of family.  Patient denies SI, HI and AVH upon discharge.  Patient acknowledged understanding of all discharge instructions.

## 2019-03-20 NOTE — Progress Notes (Signed)
[name] wished to speak with chaplain after group.    Requested prayers.   Spoke with chaplain about his financial challenges - stating prior to admission at [facility], he had contacted [employer] and they took information on his bills and spoke with him about [employer] program. [name] came home to find his power had been turned off and he is concerned about leaving [facility] without resources or a plan for getting power restored.   [name] has contact number for someone at [facility], and chaplain encouraged [name] to reach out to this person.     On follow up, [name] reports frustration, as the number he has sends him to a phone tree and he was not able to speak with anyone at [facility].

## 2019-03-20 NOTE — Progress Notes (Signed)
Recreation Therapy Notes  Date:  11.16.20 Time: 0930 Location: 300 Hall Dayroom  Group Topic: Stress Management  Goal Area(s) Addresses:  Patient will identify positive stress management techniques. Patient will identify benefits of using stress management post d/c.  Behavioral Response: Engaged  Intervention: Stress Management  Activity :  [facility].  LRT read a script that focused on taking a walk through a relaxing forest.  Patients were to listen and follow along as script was read to engage in activity.  Education:  Stress Management, Discharge Planning.   Education Outcome: Acknowledges Education  Clinical Observations/Feedback: Pt attended and participated in activity.    Victorino Sparrow, LRT/CTRS         Ria Comment, Leata Dominy A 03/20/2019 11:10 AM

## 2019-03-20 NOTE — Progress Notes (Signed)
D.  Pt pleasant on approach, no complaints voiced.  Pt can be very labile at times and due to this and him keeping his room at 90 degrees, patient received a no roommate order on day shift.  Pt did attend evening wrap up group, and has been observed in dayroom interacting appropriately with peers on the unit.  Pt denies SI/HI/AVH at this time and is hopeful to speak to social worker tomorrow about services available to him at discharge.  A.  Support and encouragement offered, medication given as ordered  R.  Pt remains safe on the unit, will continue to monitor.

## 2020-02-06 ENCOUNTER — Emergency Department (HOSPITAL_COMMUNITY): Payer: BLUE CROSS/BLUE SHIELD

## 2020-02-06 ENCOUNTER — Other Ambulatory Visit: Payer: Self-pay

## 2020-02-06 ENCOUNTER — Encounter (HOSPITAL_COMMUNITY): Payer: Self-pay

## 2020-02-06 ENCOUNTER — Emergency Department (HOSPITAL_COMMUNITY)
Admission: EM | Admit: 2020-02-06 | Discharge: 2020-02-06 | Disposition: A | Discharge status: Home / Self Care | Payer: BLUE CROSS/BLUE SHIELD | Attending: Emergency Medicine | Admitting: Emergency Medicine

## 2020-02-06 DIAGNOSIS — R0602 Shortness of breath: Secondary | ICD-10-CM | POA: Diagnosis present

## 2020-02-06 DIAGNOSIS — I10 Essential (primary) hypertension: Secondary | ICD-10-CM | POA: Diagnosis not present

## 2020-02-06 DIAGNOSIS — R0789 Other chest pain: Secondary | ICD-10-CM

## 2020-02-06 DIAGNOSIS — F17210 Nicotine dependence, cigarettes, uncomplicated: Secondary | ICD-10-CM | POA: Diagnosis not present

## 2020-02-06 DIAGNOSIS — M549 Dorsalgia, unspecified: Secondary | ICD-10-CM | POA: Insufficient documentation

## 2020-02-06 DIAGNOSIS — R519 Headache, unspecified: Secondary | ICD-10-CM | POA: Diagnosis not present

## 2020-02-06 DIAGNOSIS — J40 Bronchitis, not specified as acute or chronic: Secondary | ICD-10-CM | POA: Diagnosis not present

## 2020-02-06 LAB — CBC
HCT: 37.6 % — ABNORMAL LOW (ref 39.0–52.0)
Hemoglobin: 11.8 g/dL — ABNORMAL LOW (ref 13.0–17.0)
MCH: 30.4 pg (ref 26.0–34.0)
MCHC: 31.4 g/dL (ref 30.0–36.0)
MCV: 96.9 fL (ref 80.0–100.0)
Platelets: 202 10*3/uL (ref 150–400)
RBC: 3.88 MIL/uL — ABNORMAL LOW (ref 4.22–5.81)
RDW: 12.8 % (ref 11.5–15.5)
WBC: 5 10*3/uL (ref 4.0–10.5)
nRBC: 0 % (ref 0.0–0.2)

## 2020-02-06 LAB — BASIC METABOLIC PANEL
Anion gap: 6 (ref 5–15)
BUN: 10 mg/dL (ref 6–20)
CO2: 29 mmol/L (ref 22–32)
Calcium: 9.4 mg/dL (ref 8.9–10.3)
Chloride: 104 mmol/L (ref 98–111)
Creatinine, Ser: 0.94 mg/dL (ref 0.61–1.24)
GFR calc non Af Amer: >60 mL/min (ref >60)
Glucose, Bld: 52 mg/dL — ABNORMAL LOW (ref 70–99)
Potassium: 3.7 mmol/L (ref 3.5–5.1)
Sodium: 139 mmol/L (ref 135–145)

## 2020-02-06 LAB — TROPONIN I (HIGH SENSITIVITY)
Troponin I (High Sensitivity): 5 ng/L (ref <18)
Troponin I (High Sensitivity): 5 ng/L (ref <18)

## 2020-02-06 MED ORDER — DEXAMETHASONE 4 MG PO TABS
6.0000 mg | ORAL_TABLET | Freq: Once | ORAL | Status: AC
  Administered 2020-02-06: 6 mg via ORAL
  Filled 2020-02-06: qty 2

## 2020-02-06 MED ORDER — ALBUTEROL SULFATE HFA 108 (90 BASE) MCG/ACT IN AERS
2.0000 | INHALATION_SPRAY | Freq: Once | RESPIRATORY_TRACT | Status: AC
  Administered 2020-02-06: 2 via RESPIRATORY_TRACT
  Filled 2020-02-06: qty 6.7

## 2020-02-06 NOTE — ED Provider Notes (Signed)
[facility] EMERGENCY DEPARTMENT Provider Note   CSN: [id number] Arrival date & time: 02/06/20  1547     History No chief complaint on file.   [name] is a 49 y.o. male.  49 year old male presents with multiple complaints.  Primary complaint is a knot on his upper sternum which he states has been present for at least a year however he feels like it is getting larger in size.  Also reports diffuse chest pain with shortness of breath, back pain and a headache.  Patient states his symptoms started several days ago after he started a new job.  States that he does not particularly enjoy this job and has not been at work for the past several days and will not need a work note.  Patient has not taken anything for his symptoms, states he is allergic to Motrin and Tylenol.  Patient is a daily smoker, denies fever, chills, productive cough or new cough.  Denies lower extremity swelling, denies chest pain with exertion or orthopnea.  No other complaints or concerns today.        Past Medical History:  Diagnosis Date  . Alcohol abuse   . Anemia   . Colitis   . Severe major depression with psychotic features [facility])     Patient Active Problem List   Diagnosis Date Noted  . MDD (major depressive disorder), recurrent episode, severe (HCC) 03/17/2019  . Essential hypertension 09/03/2014  . Tobacco use disorder 09/03/2014  . Substance abuse (HCC)   . Depression   . Cellulitis of nasal tip   . Chronic back pain   . Cellulitis of nose 08/26/2014  . Cellulitis 08/26/2014  . MDD (major depressive disorder), recurrent severe, without psychosis (HCC) 06/09/2014  . 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 Alcohol dependence (HCC) 06/08/2011  . Polysubstance abuse (HCC) 05/31/2011    History reviewed. No pertinent surgical history.     Family History  Problem Relation Age of Onset  . Hypertension Mother   .  Hypertension Father   . Hypertension Other     Social History   Tobacco Use  . Smoking status: Current Every Day Smoker    Packs/day: 0.25    Types: Cigarettes  . Smokeless tobacco: Never Used  Substance Use Topics  . Alcohol use: No    Comment: former  last use1 year ago  . Drug use: Yes    Frequency: 1.0 times per week    Types: Marijuana    Comment: former    Home Medications Prior to Admission medications   Not on File    Allergies    Acetaminophen, Aspirin, Ibuprofen, Pork-derived products, and Sertraline  Review of Systems   Review of Systems  Constitutional: Negative for chills and fever.  HENT: Negative for congestion and sneezing.   Eyes: Negative for visual disturbance.  Respiratory: Positive for shortness of breath. Negative for cough and chest tightness.   Cardiovascular: Positive for chest pain. Negative for palpitations and leg swelling.  Gastrointestinal: Negative for abdominal pain, nausea and vomiting.  Musculoskeletal: Negative for arthralgias and myalgias.  Skin: Negative for rash and wound.  Allergic/Immunologic: Negative for immunocompromised state.  Neurological: Positive for headaches.  Psychiatric/Behavioral: Negative for confusion.  All other systems reviewed and are negative.   Physical Exam Updated Vital Signs BP 117/68 (BP Location: Left Arm)   Pulse 81   Temp 97.9 F (36.6 C) (Oral)   Resp 18   Ht 5\' 7"  (  1.702 m)   Wt 63.5 kg   SpO2 98%   BMI 21.93 kg/m   Physical Exam Vitals and nursing note reviewed.  Constitutional:      General: He is not in acute distress.    Appearance: He is well-developed. He is not diaphoretic.  HENT:     Head: Normocephalic and atraumatic.  Eyes:     Extraocular Movements: Extraocular movements intact.     Pupils: Pupils are equal, round, and reactive to light.  Cardiovascular:     Rate and Rhythm: Normal rate and regular rhythm.     Pulses: Normal pulses.     Heart sounds: Normal heart  sounds.  Pulmonary:     Effort: Pulmonary effort is normal.     Breath sounds: Normal breath sounds.  Chest:     Chest wall: Tenderness present.    Abdominal:     Palpations: Abdomen is soft.     Tenderness: There is no abdominal tenderness.  Musculoskeletal:     Right lower leg: No edema.     Left lower leg: No edema.  Skin:    General: Skin is warm and dry.     Findings: No erythema or rash.  Neurological:     Mental Status: He is alert and oriented to person, place, and time.  Psychiatric:        Behavior: Behavior normal.     ED Results / Procedures / Treatments   Labs (all labs ordered are listed, but only abnormal results are displayed) Labs Reviewed  BASIC METABOLIC PANEL - Abnormal; Notable for the following components:      Result Value   Glucose, Bld 52 (*)    All other components within normal limits  CBC - Abnormal; Notable for the following components:   RBC 3.88 (*)    Hemoglobin 11.8 (*)    HCT 37.6 (*)    All other components within normal limits  TROPONIN I (HIGH SENSITIVITY)  TROPONIN I (HIGH SENSITIVITY)    EKG EKG Interpretation  Date/Time:  Tuesday February 06 2020 16:26:25 EDT Ventricular Rate:  70 PR Interval:  146 QRS Duration: 92 QT Interval:  392 QTC Calculation: 423 R Axis:   80 Text Interpretation: Normal sinus rhythm Minimal voltage criteria for LVH, may be normal variant ( Sokolow-Lyon ) Borderline ECG When compared with ECG of 03/16/2019, No significant change was found Confirmed by Dione Booze ([id number]) on 02/06/2020 4:57:35 PM   Radiology DG Chest 2 View  Result Date: 02/06/2020 CLINICAL DATA:  Chest pain. EXAM: CHEST - 2 VIEW COMPARISON:  04/30/2017 radiograph and CT FINDINGS: Mild bronchial thickening with borderline hyperinflation.The cardiomediastinal contours are normal. EKG leads overlie the chest. Pulmonary vasculature is normal. No consolidation, pleural effusion, or pneumothorax. No acute osseous abnormalities are seen.  IMPRESSION: Mild bronchial thickening with borderline hyperinflation, may be smoking related, bronchitis or asthma. Electronically Signed   By: Narda Rutherford M.D.   On: 02/06/2020 17:14    Procedures Procedures (including critical care time)  Medications Ordered in ED Medications  albuterol (VENTOLIN HFA) 108 (90 Base) MCG/ACT inhaler 2 puff (2 puffs Inhalation Given 02/06/20 2024)  dexamethasone (DECADRON) tablet 6 mg (6 mg Oral Given 02/06/20 2024)    ED Course  I have reviewed the triage vital signs and the nursing notes.  Pertinent labs & imaging results that were available during my care of the patient were reviewed by me and considered in my medical decision making (see chart for details).  Clinical Course  as of Feb 05 2200  Tue Feb 06, 2020  2155 49 yo male with multiple complaints as above. On exam, well appearing, does have a palpable bony irregularity at the upper sternum without significant tenderness or concern for overlying infection. Neuro exam normal. Lungs clear.  CXR with bronchitic changes, no URI symptoms, discussed smoking cessation, will give albuterol inhaler. Given dose of decadron in the ER for headache (allergy to tylenol and NSAIDs).   Labs reassuring including CBC, BMP, troponin 5 (unchanged from prior and doubt ACS with presentation today). EKG without ischemic changes,  Referred to PCP for follow up. Given work note as requested.    [LM]    Clinical Course User Index [LM] Alden Hipp   MDM Rules/Calculators/A&P                          Final Clinical Impression(s) / ED Diagnoses Final diagnoses:  Bronchitis  Acute nonintractable headache, unspecified headache type  Chest wall pain    Rx / DC Orders ED Discharge Orders    None       Jeannie Fend, PA-C 02/06/20 2201    Wynetta Fines, MD 02/09/20 [phone]

## 2020-02-06 NOTE — Discharge Instructions (Signed)
Follow-up with a primary care provider.  Referral given to [facility], call to schedule an appointment.  Decadron given today in the emergency room as well as albuterol, the Decadron may help with your shortness of breath and your headache, the albuterol is for the shortness of breath.  As discussed, you have bronchitis, likely from smoking.  Recommend you stop smoking.

## 2020-02-06 NOTE — ED Triage Notes (Signed)
Patient complains of chest pain for the past few days with back pain. Also complains of migraine headache for same. Patient states he came a year ago and had knot to chest checked. NAD

## 2020-02-23 ENCOUNTER — Other Ambulatory Visit: Payer: Self-pay

## 2020-02-23 ENCOUNTER — Emergency Department (HOSPITAL_COMMUNITY)
Admission: EM | Admit: 2020-02-23 | Discharge: 2020-02-23 | Disposition: A | Discharge status: Home / Self Care | Payer: BLUE CROSS/BLUE SHIELD | Attending: Emergency Medicine | Admitting: Emergency Medicine

## 2020-02-23 DIAGNOSIS — F17210 Nicotine dependence, cigarettes, uncomplicated: Secondary | ICD-10-CM | POA: Diagnosis not present

## 2020-02-23 DIAGNOSIS — I10 Essential (primary) hypertension: Secondary | ICD-10-CM | POA: Insufficient documentation

## 2020-02-23 DIAGNOSIS — M545 Low back pain, unspecified: Secondary | ICD-10-CM | POA: Diagnosis not present

## 2020-02-23 MED ORDER — CYCLOBENZAPRINE HCL 10 MG PO TABS: 10.0000 mg | ORAL_TABLET | Freq: Every day | ORAL | 0 refills | Status: DC

## 2020-02-23 MED ORDER — TRAMADOL HCL 50 MG PO TABS
50.0000 mg | ORAL_TABLET | Freq: Once | ORAL | Status: AC
  Administered 2020-02-23: 50 mg via ORAL
  Filled 2020-02-23: qty 1

## 2020-02-23 NOTE — Discharge Instructions (Signed)
Please read instructions below.  Apply ice to your back for 20 minutes at a time.  You can also apply heat if this provides more relief.   You can take Flexeril/cyclobenzaprine every 12 hours as needed for muscle spasm.  Be aware this medication can make you drowsy; do not take while driving or drinking alcohol.   Follow-up with your primary care provider symptoms persist.   Return to ER if new numbness or tingling in your arms or legs, inability to urinate, inability to hold your bowels, or weakness in your extremities.

## 2020-02-23 NOTE — ED Triage Notes (Signed)
Pt arrived via EMS for chest wall pain and lower back pain. Once pt arrived to ED he states no chest wall pain on lower back pain. Pt is A/Ox4.

## 2020-02-23 NOTE — ED Provider Notes (Signed)
[facility] EMERGENCY DEPARTMENT Provider Note   CSN: [id number] Arrival date & time: 02/23/20  2052     History Chief Complaint  Patient presents with  . Back Pain    [name] is a 49 y.o. male presenting to the emergency department with complaint of low back pain that began this afternoon.  He states initially pain was mild though is gradually worsened throughout the day.  It is bilateral lumbar region, worse with movement and ambulation.  He feels better at rest when he is sitting still.  He denies deviation of pain down his legs, new numbness or weakness in extremities, bowel or bladder incontinence, saddle paresthesias.  He has no abdominal or urinary symptoms.  Denies recent injury or heavy lifting.  He has not treated his symptoms with any medications due to allergy to Tylenol and ibuprofen.  The history is provided by the patient.       Past Medical History:  Diagnosis Date  . Alcohol abuse   . Anemia   . Colitis   . Severe major depression with psychotic features [facility])     Patient Active Problem List   Diagnosis Date Noted  . MDD (major depressive disorder), recurrent episode, severe (HCC) 03/17/2019  . Essential hypertension 09/03/2014  . Tobacco use disorder 09/03/2014  . Substance abuse (HCC)   . Depression   . Cellulitis of nasal tip   . Chronic back pain   . Cellulitis of nose 08/26/2014  . Cellulitis 08/26/2014  . MDD (major depressive disorder), recurrent severe, without psychosis (HCC) 06/09/2014  . 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 Alcohol dependence (HCC) 06/08/2011  . Polysubstance abuse (HCC) 05/31/2011    No past surgical history on file.     Family History  Problem Relation Age of Onset  . Hypertension Mother   . Hypertension Father   . Hypertension Other     Social History   Tobacco Use  . Smoking status: Current Every Day Smoker    Packs/day: 0.25     Types: Cigarettes  . Smokeless tobacco: Never Used  Substance Use Topics  . Alcohol use: No    Comment: former  last use1 year ago  . Drug use: Yes    Frequency: 1.0 times per week    Types: Marijuana    Comment: former    Home Medications Prior to Admission medications   Medication Sig Start Date End Date Taking? Authorizing Provider  cyclobenzaprine (FLEXERIL) 10 MG tablet Take 1 tablet (10 mg total) by mouth at bedtime. 02/23/20   Surafel Hilleary, [place] N, PA-C    Allergies    Acetaminophen, Aspirin, Ibuprofen, Pork-derived products, and Sertraline  Review of Systems   Review of Systems  Musculoskeletal: Positive for back pain.  Neurological: Negative for weakness and numbness.    Physical Exam Updated Vital Signs BP (!) 145/89 (BP Location: Right Arm)   Pulse 92   Temp 98.1 F (36.7 C) (Oral)   Resp 16   Ht 5\' 7"  (1.702 m)   Wt 63.5 kg   SpO2 99%   BMI 21.93 kg/m   Physical Exam Vitals and nursing note reviewed.  Constitutional:      General: He is not in acute distress.    Appearance: He is well-developed.  HENT:     Head: Normocephalic and atraumatic.  Eyes:     Conjunctiva/sclera: Conjunctivae normal.  Cardiovascular:     Rate and Rhythm: Normal rate and regular rhythm.  Pulmonary:     Effort: Pulmonary effort is normal. No respiratory distress.     Breath sounds: Normal breath sounds.  Musculoskeletal:     Comments: Generalized TTP to lumbar musculature. No midline TTP, no gross deformities. Positive straight leg raise bilaterally.  Neurological:     Mental Status: He is alert.     Comments: Normal tone.  5/5 strength in BLE including strong and equal dorsiflexion/plantar flexion Sensory:  light touch normal in BLE extremities.   Gait: antalgic gait and nl balance CV: distal pulses palpable throughout    Psychiatric:        Mood and Affect: Mood normal.        Behavior: Behavior normal.     ED Results / Procedures / Treatments   Labs (all labs  ordered are listed, but only abnormal results are displayed) Labs Reviewed - No data to display  EKG None  Radiology No results found.  Procedures Procedures (including critical care time)  Medications Ordered in ED Medications  traMADol (ULTRAM) tablet 50 mg (50 mg Oral Given 02/23/20 2155)    ED Course  I have reviewed the triage vital signs and the nursing notes.  Pertinent labs & imaging results that were available during my care of the patient were reviewed by me and considered in my medical decision making (see chart for details).    MDM Rules/Calculators/A&P                          Patient with back pain.  No neurological deficits and normal neuro exam.  Patient can walk but states is painful.  No loss of bowel or bladder control.  No concern for cauda equina.  RICE protocol and pain medicine indicated and discussed with patient.   Final Clinical Impression(s) / ED Diagnoses Final diagnoses:  Acute bilateral low back pain without sciatica    Rx / DC Orders ED Discharge Orders         Ordered    cyclobenzaprine (FLEXERIL) 10 MG tablet  Daily at bedtime        02/23/20 2155           Indria Bishara, [place] N, PA-C 02/23/20 2345    Gwyneth Sprout, MD 02/26/20 [phone]

## 2020-03-10 ENCOUNTER — Other Ambulatory Visit: Payer: Self-pay

## 2020-03-10 ENCOUNTER — Emergency Department (HOSPITAL_COMMUNITY): Payer: BLUE CROSS/BLUE SHIELD

## 2020-03-10 ENCOUNTER — Encounter (HOSPITAL_COMMUNITY): Payer: Self-pay | Admitting: Emergency Medicine

## 2020-03-10 ENCOUNTER — Emergency Department (HOSPITAL_COMMUNITY)
Admission: EM | Admit: 2020-03-10 | Discharge: 2020-03-10 | Disposition: A | Discharge status: Home / Self Care | Payer: BLUE CROSS/BLUE SHIELD | Attending: Emergency Medicine | Admitting: Emergency Medicine

## 2020-03-10 DIAGNOSIS — Z79899 Other long term (current) drug therapy: Secondary | ICD-10-CM | POA: Insufficient documentation

## 2020-03-10 DIAGNOSIS — F17210 Nicotine dependence, cigarettes, uncomplicated: Secondary | ICD-10-CM | POA: Diagnosis not present

## 2020-03-10 DIAGNOSIS — I10 Essential (primary) hypertension: Secondary | ICD-10-CM | POA: Diagnosis not present

## 2020-03-10 DIAGNOSIS — M545 Low back pain, unspecified: Secondary | ICD-10-CM | POA: Insufficient documentation

## 2020-03-10 MED ORDER — METHYLPREDNISOLONE 4 MG PO TBPK: ORAL_TABLET | ORAL | 0 refills | Status: DC

## 2020-03-10 MED ORDER — PREDNISONE 20 MG PO TABS
60.0000 mg | ORAL_TABLET | Freq: Once | ORAL | Status: AC
  Administered 2020-03-10: 60 mg via ORAL
  Filled 2020-03-10: qty 3

## 2020-03-10 MED ORDER — TRAMADOL HCL 50 MG PO TABS
100.0000 mg | ORAL_TABLET | Freq: Once | ORAL | Status: AC
  Administered 2020-03-10: 100 mg via ORAL
  Filled 2020-03-10: qty 2

## 2020-03-10 NOTE — Discharge Instructions (Addendum)
You were seen in the ED for back pain  X-ray shows arthritis but nothing acute or worrisome  Your pain may be from a muscle strain, spasm or nerve irritation  I have prescribed you methylprednisone Dosepak, take this as prescribed  Continue taking Flexeril.  You can take this every 8 hours throughout the day but note that it will make you a little sleepy  Call [place] [facility] to make an appointment and get a primary care doctor who can continue management and treatment.  You may need a referral or evaluation with neurosurgeons  Return to the ED for worsening or new symptoms

## 2020-03-10 NOTE — ED Triage Notes (Signed)
  Patient BIB EMS for R lower back pain that has been going on all today.  Patient states it is sharp above his R buttock and stops just below his R buttock.  Sharp pain 10/10.  Patient states he is unable to walk due to pain.  Patient in [facility] at this time.
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
## 2020-03-10 NOTE — ED Notes (Signed)
Called pt twice for vitals recheck and no answer
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
## 2020-05-08 ENCOUNTER — Ambulatory Visit: Payer: BLUE CROSS/BLUE SHIELD | Attending: Physician Assistant | Admitting: Physician Assistant

## 2020-05-08 ENCOUNTER — Other Ambulatory Visit: Payer: Self-pay

## 2020-05-08 DIAGNOSIS — Z09 Encounter for follow-up examination after completed treatment for conditions other than malignant neoplasm: Secondary | ICD-10-CM

## 2020-05-08 DIAGNOSIS — M5136 Other intervertebral disc degeneration, lumbar region: Secondary | ICD-10-CM

## 2020-05-08 MED ORDER — METHYLPREDNISOLONE 4 MG PO TBPK: ORAL_TABLET | ORAL | 0 refills | Status: DC

## 2020-05-08 MED ORDER — CYCLOBENZAPRINE HCL 10 MG PO TABS: 10.0000 mg | ORAL_TABLET | Freq: Every day | ORAL | 0 refills | Status: DC

## 2021-01-20 DIAGNOSIS — M545 Low back pain, unspecified: Secondary | ICD-10-CM | POA: Insufficient documentation

## 2021-01-20 DIAGNOSIS — R45851 Suicidal ideations: Secondary | ICD-10-CM | POA: Insufficient documentation

## 2021-01-20 DIAGNOSIS — Z5321 Procedure and treatment not carried out due to patient leaving prior to being seen by health care provider: Secondary | ICD-10-CM | POA: Insufficient documentation

## 2021-01-20 DIAGNOSIS — F32A Depression, unspecified: Secondary | ICD-10-CM | POA: Insufficient documentation

## 2021-01-20 DIAGNOSIS — Z20822 Contact with and (suspected) exposure to covid-19: Secondary | ICD-10-CM | POA: Insufficient documentation

## 2021-01-21 ENCOUNTER — Encounter (HOSPITAL_COMMUNITY): Payer: Self-pay

## 2021-01-21 ENCOUNTER — Other Ambulatory Visit: Payer: Self-pay

## 2021-01-21 ENCOUNTER — Emergency Department (HOSPITAL_COMMUNITY)
Admission: EM | Admit: 2021-01-21 | Discharge: 2021-01-21 | Disposition: A | Discharge status: Home / Self Care | Payer: BLUE CROSS/BLUE SHIELD | Attending: Emergency Medicine | Admitting: Emergency Medicine

## 2021-01-21 LAB — CBC WITH DIFFERENTIAL/PLATELET
Abs Immature Granulocytes: 0.02 10*3/uL (ref 0.00–0.07)
Basophils Absolute: 0.1 10*3/uL (ref 0.0–0.1)
Basophils Relative: 1 %
Eosinophils Absolute: 0.4 10*3/uL (ref 0.0–0.5)
Eosinophils Relative: 4 %
HCT: 38.1 % — ABNORMAL LOW (ref 39.0–52.0)
Hemoglobin: 12.2 g/dL — ABNORMAL LOW (ref 13.0–17.0)
Immature Granulocytes: 0 %
Lymphocytes Relative: 33 %
Lymphs Abs: 3 10*3/uL (ref 0.7–4.0)
MCH: 31.3 pg (ref 26.0–34.0)
MCHC: 32 g/dL (ref 30.0–36.0)
MCV: 97.7 fL (ref 80.0–100.0)
Monocytes Absolute: 0.7 10*3/uL (ref 0.1–1.0)
Monocytes Relative: 8 %
Neutro Abs: 4.9 10*3/uL (ref 1.7–7.7)
Neutrophils Relative %: 54 %
Platelets: 226 10*3/uL (ref 150–400)
RBC: 3.9 MIL/uL — ABNORMAL LOW (ref 4.22–5.81)
RDW: 13.2 % (ref 11.5–15.5)
WBC: 9.1 10*3/uL (ref 4.0–10.5)
nRBC: 0 % (ref 0.0–0.2)

## 2021-01-21 LAB — RAPID URINE DRUG SCREEN, HOSP PERFORMED
Amphetamines: NOT DETECTED
Barbiturates: NOT DETECTED
Benzodiazepines: NOT DETECTED
Cocaine: NOT DETECTED
Opiates: NOT DETECTED
Tetrahydrocannabinol: POSITIVE — AB

## 2021-01-21 LAB — COMPREHENSIVE METABOLIC PANEL WITH GFR
ALT: 10 U/L (ref 0–44)
AST: 15 U/L (ref 15–41)
Albumin: 4.2 g/dL (ref 3.5–5.0)
Alkaline Phosphatase: 34 U/L — ABNORMAL LOW (ref 38–126)
Anion gap: 9 (ref 5–15)
BUN: 10 mg/dL (ref 6–20)
CO2: 27 mmol/L (ref 22–32)
Calcium: 9.3 mg/dL (ref 8.9–10.3)
Chloride: 101 mmol/L (ref 98–111)
Creatinine, Ser: 0.91 mg/dL (ref 0.61–1.24)
GFR, Estimated: >60 mL/min
Glucose, Bld: 95 mg/dL (ref 70–99)
Potassium: 4.1 mmol/L (ref 3.5–5.1)
Sodium: 137 mmol/L (ref 135–145)
Total Bilirubin: 0.4 mg/dL (ref 0.3–1.2)
Total Protein: 6.7 g/dL (ref 6.5–8.1)

## 2021-01-21 LAB — ETHANOL: Alcohol, Ethyl (B): <10 mg/dL

## 2021-01-21 LAB — RESP PANEL BY RT-PCR (FLU A&B, COVID) ARPGX2
Influenza A by PCR: NEGATIVE
Influenza B by PCR: NEGATIVE
SARS Coronavirus 2 by RT PCR: NEGATIVE

## 2021-01-21 NOTE — ED Provider Notes (Signed)
Patient was here voluntarily and stated he was leaving. No IVC ordered   Saddie Benders, PA-C 01/21/21 5697    Terald Sleeper, MD 01/21/21 1100

## 2021-01-21 NOTE — BH Assessment (Signed)
TTS informed by Georgeanna Lea, PA-C patient eloped left AMA

## 2021-01-21 NOTE — ED Provider Notes (Signed)
Emergency Medicine Provider Triage Evaluation Note  [name] , a 50 y.o. male  was evaluated in triage.  Pt complains of back pain and depression.  States he lost his oldest daughter to COVID in 2020.  States he feels suicidal.  Also reports acute on chronic back pain, which he says is from arthritis.  Review of Systems  Positive: SI, back pain Negative: Fever, cough  Physical Exam  BP 119/78 (BP Location: Left Arm)   Pulse 62   Temp 98.3 F (36.8 C) (Oral)   Resp 18   SpO2 99%  Gen:   Awake, no distress   Resp:  Normal effort  MSK:   Moves extremities without difficulty  Other:    Medical Decision Making  Medically screening exam initiated at 12:29 AM.  Appropriate orders placed.  [name] was informed that the remainder of the evaluation will be completed by another provider, this initial triage assessment does not replace that evaluation, and the importance of remaining in the ED until their evaluation is complete.  SI, back pain   Roxy Horseman, PA-C 01/21/21 0030    Gilda Crease, MD 01/21/21 [phone]

## 2021-01-21 NOTE — ED Triage Notes (Signed)
Pt c/o back pain and depression.  Pt states he has been dealing "with a lot". Pt endorses SI.  Pt denies HI/AVH.

## 2021-04-02 ENCOUNTER — Other Ambulatory Visit: Payer: Self-pay

## 2021-04-02 ENCOUNTER — Emergency Department (HOSPITAL_COMMUNITY)
Admission: EM | Admit: 2021-04-02 | Discharge: 2021-04-02 | Disposition: A | Discharge status: Home / Self Care | Payer: Self-pay | Attending: Emergency Medicine | Admitting: Emergency Medicine

## 2021-04-02 DIAGNOSIS — I10 Essential (primary) hypertension: Secondary | ICD-10-CM | POA: Insufficient documentation

## 2021-04-02 DIAGNOSIS — Z79899 Other long term (current) drug therapy: Secondary | ICD-10-CM | POA: Insufficient documentation

## 2021-04-02 DIAGNOSIS — F17210 Nicotine dependence, cigarettes, uncomplicated: Secondary | ICD-10-CM | POA: Insufficient documentation

## 2021-04-02 DIAGNOSIS — L602 Onychogryphosis: Secondary | ICD-10-CM

## 2021-04-02 DIAGNOSIS — L600 Ingrowing nail: Secondary | ICD-10-CM | POA: Insufficient documentation

## 2021-04-02 NOTE — ED Provider Notes (Signed)
Emergency Medicine Provider Triage Evaluation Note  Hartwell Vandiver , a 50 y.o. male  was evaluated in triage.  Pt complains of foot pain ongoing for months. Concerned for fungal infection. Denies fever or injury.  Review of Systems  Positive: Foot pain Negative: fever  Physical Exam  BP 132/84 (BP Location: Left Arm)   Pulse 70   Temp 98.2 F (36.8 C) (Oral)   Resp 16   SpO2 98%  Gen:   Awake, no distress   Resp:  Normal effort  MSK:   Moves extremities without difficulty  Other:  Appears to have onychomycosis to the right foot  Medical Decision Making  Medically screening exam initiated at 7:37 PM.  Appropriate orders placed.  Mamadou Breon was informed that the remainder of the evaluation will be completed by another provider, this initial triage assessment does not replace that evaluation, and the importance of remaining in the ED until their evaluation is complete.     Rayne Du 04/02/21 1939    Lorre Nick, MD 04/03/21 1546

## 2021-04-02 NOTE — ED Provider Notes (Signed)
[facility] EMERGENCY DEPARTMENT Provider Note   CSN: [id number] Arrival date & time: 04/02/21  1809     History Chief Complaint  Patient presents with   Foot Pain    [name] is a 50 y.o. male.  50 year old male presents with chronic overgrown toenails on his right foot since being incarcerated.  Denies any fever or chills.  No drainage noted.  Has attempted to use toenail clippers without relief.  No red streak going up his leg      Past Medical History:  Diagnosis Date   Alcohol abuse    Anemia    Colitis    Severe major depression with psychotic features [facility])     Patient Active Problem List   Diagnosis Date Noted   MDD (major depressive disorder), recurrent episode, severe (HCC) 03/17/2019   Essential hypertension 09/03/2014   Tobacco use disorder 09/03/2014   Substance abuse (HCC)    Depression    Cellulitis of nasal tip    Chronic back pain    Cellulitis of nose 08/26/2014   Cellulitis 08/26/2014   MDD (major depressive disorder), recurrent severe, without psychosis (HCC) 06/09/2014   Major depressive disorder, recurrent, severe with psychotic features (HCC) 06/08/2011   Alcohol dependence (HCC) 06/08/2011   Polysubstance abuse (HCC) 05/31/2011    No past surgical history on file.     Family History  Problem Relation Age of Onset   Hypertension Mother    Hypertension Father    Hypertension Other     Social History   Tobacco Use   Smoking status: Every Day    Packs/day: 0.25    Types: Cigarettes   Smokeless tobacco: Never  Vaping Use   Vaping Use: Never used  Substance Use Topics   Alcohol use: No    Comment: former  last use1 year ago   Drug use: Yes    Frequency: 1.0 times per week    Types: Marijuana    Comment: former    Home Medications Prior to Admission medications   Medication Sig Start Date End Date Taking? Authorizing Provider  cyclobenzaprine (FLEXERIL) 10 MG tablet Take 1 tablet (10 mg total) by  mouth at bedtime. Patient taking differently: Take 10 mg by mouth at bedtime as needed for muscle spasms. 05/08/20   Anders Simmonds, PA-C  methylPREDNISolone (MEDROL DOSEPAK) 4 MG TBPK tablet Take medrol pack for back pain until completed Patient not taking: Reported on 01/21/2021 05/08/20   Anders Simmonds, PA-C    Allergies    Acetaminophen, Aspirin, Ibuprofen, Pork-derived products, and Sertraline  Review of Systems   Review of Systems  All other systems reviewed and are negative.  Physical Exam Updated Vital Signs BP 132/84 (BP Location: Left Arm)   Pulse 70   Temp 98.2 F (36.8 C) (Oral)   Resp 16   SpO2 98%   Physical Exam Vitals and nursing note reviewed.  Constitutional:      Appearance: He is well-developed. He is not toxic-appearing.  HENT:     Head: Normocephalic and atraumatic.  Eyes:     Conjunctiva/sclera: Conjunctivae normal.     Pupils: Pupils are equal, round, and reactive to light.  Cardiovascular:     Rate and Rhythm: Normal rate.  Pulmonary:     Effort: Pulmonary effort is normal.  Musculoskeletal:     Cervical back: Normal range of motion.  Feet:     Comments: Severe overgrown toenails on the right foot.  No obvious evidence of  infection Skin:    General: Skin is warm and dry.  Neurological:     Mental Status: He is alert and oriented to person, place, and time.    ED Results / Procedures / Treatments   Labs (all labs ordered are listed, but only abnormal results are displayed) Labs Reviewed - No data to display  EKG None  Radiology No results found.  Procedures Procedures   Medications Ordered in ED Medications - No data to display  ED Course  I have reviewed the triage vital signs and the nursing notes.  Pertinent labs & imaging results that were available during my care of the patient were reviewed by me and considered in my medical decision making (see chart for details).    MDM Rules/Calculators/A&P                            Patient with overgrown toenails and has been given referral to podiatry Final Clinical Impression(s) / ED Diagnoses Final diagnoses:  None    Rx / DC Orders ED Discharge Orders     None        Lacretia Leigh, MD 04/02/21 2140

## 2021-04-02 NOTE — ED Triage Notes (Signed)
Pt here for R foot pain, pt reports he has had chronic foot issues but today his foot was swollen and "blue and purple", pt reports 10/10 pain.

## 2021-04-02 NOTE — Discharge Instructions (Signed)
Call the podiatrist at [facility] to schedule an appointment to be seen  [phone]

## 2021-04-07 ENCOUNTER — Ambulatory Visit: Payer: Self-pay | Admitting: Podiatry

## 2021-04-08 ENCOUNTER — Ambulatory Visit (INDEPENDENT_AMBULATORY_CARE_PROVIDER_SITE_OTHER): Payer: Self-pay | Admitting: Podiatry

## 2021-04-08 ENCOUNTER — Other Ambulatory Visit: Payer: Self-pay

## 2021-04-08 DIAGNOSIS — B351 Tinea unguium: Secondary | ICD-10-CM

## 2021-04-08 DIAGNOSIS — M79609 Pain in unspecified limb: Secondary | ICD-10-CM

## 2021-04-08 MED ORDER — FLUCONAZOLE 150 MG PO TABS: 150.0000 mg | ORAL_TABLET | ORAL | 2 refills | Status: DC

## 2021-04-08 NOTE — Progress Notes (Signed)
  Subjective:  Patient ID: [name], male    DOB: 1970/10/06,  MRN: [id number]  Chief Complaint  Patient presents with   Nail Problem    Patient reports elongated toenails, thick, difficult to trim, discolored and painful   50 y.o. male presents with the above complaint. History confirmed with patient. States he has tried several treatments but unsure what they are. The nails are dark and very thick. Much worse on the right foot  PCP: Patient, No Pcp Per (Inactive); last seen unsure  Objective:  Physical Exam: warm, good capillary refill, nail exam severely long and dystrophic nails right foot, black discoloration significant subungual debris, malodor, black discoloration. Left foot nails more normal in appearance., no trophic changes or ulcerative lesions. DP pulses palpable, PT pulses palpable, and protective sensation intact  Hgb A1c MFr Bld  Date Value Ref Range Status  08/27/2014 5.2 4.8 - 5.6 % Final    Comment:    (NOTE)         Pre-diabetes: 5.7 - 6.4         Diabetes: >6.4         Glycemic control for adults with diabetes: <7.0     No images are attached to the encounter.  Assessment:   1. Pain due to onychomycosis of nail     Plan:  Patient was evaluated and treated and all questions answered.  Onychomycosis -Nails palliatively debrided secondary to pain -Discussed treatment with patient. Rx fluconazole weekly. Educated on use. Discussed taking one tablet per week the same day of every week.  Procedure: Nail Debridement Type of Debridement: manual, sharp debridement. Instrumentation: Nail nipper, rotary burr. Number of Nails: 10  No follow-ups on file.

## 2021-06-06 ENCOUNTER — Emergency Department (HOSPITAL_COMMUNITY): Payer: Self-pay

## 2021-06-06 ENCOUNTER — Encounter (HOSPITAL_COMMUNITY): Payer: Self-pay | Admitting: Emergency Medicine

## 2021-06-06 ENCOUNTER — Emergency Department (HOSPITAL_COMMUNITY)
Admission: EM | Admit: 2021-06-06 | Discharge: 2021-06-06 | Disposition: A | Discharge status: Home / Self Care | Payer: Self-pay | Attending: Emergency Medicine | Admitting: Emergency Medicine

## 2021-06-06 ENCOUNTER — Other Ambulatory Visit: Payer: Self-pay

## 2021-06-06 DIAGNOSIS — R079 Chest pain, unspecified: Secondary | ICD-10-CM | POA: Insufficient documentation

## 2021-06-06 DIAGNOSIS — M546 Pain in thoracic spine: Secondary | ICD-10-CM | POA: Insufficient documentation

## 2021-06-06 DIAGNOSIS — M545 Low back pain, unspecified: Secondary | ICD-10-CM | POA: Insufficient documentation

## 2021-06-06 DIAGNOSIS — G8929 Other chronic pain: Secondary | ICD-10-CM | POA: Insufficient documentation

## 2021-06-06 LAB — BASIC METABOLIC PANEL
Anion gap: 9 (ref 5–15)
BUN: 8 mg/dL (ref 6–20)
CO2: 26 mmol/L (ref 22–32)
Calcium: 9.2 mg/dL (ref 8.9–10.3)
Chloride: 103 mmol/L (ref 98–111)
Creatinine, Ser: 0.94 mg/dL (ref 0.61–1.24)
GFR, Estimated: >60 mL/min (ref >60)
Glucose, Bld: 112 mg/dL — ABNORMAL HIGH (ref 70–99)
Potassium: 3.3 mmol/L — ABNORMAL LOW (ref 3.5–5.1)
Sodium: 138 mmol/L (ref 135–145)

## 2021-06-06 LAB — CBC
HCT: 36.7 % — ABNORMAL LOW (ref 39.0–52.0)
Hemoglobin: 11.7 g/dL — ABNORMAL LOW (ref 13.0–17.0)
MCH: 30.6 pg (ref 26.0–34.0)
MCHC: 31.9 g/dL (ref 30.0–36.0)
MCV: 96.1 fL (ref 80.0–100.0)
Platelets: 229 10*3/uL (ref 150–400)
RBC: 3.82 MIL/uL — ABNORMAL LOW (ref 4.22–5.81)
RDW: 13.2 % (ref 11.5–15.5)
WBC: 7.1 10*3/uL (ref 4.0–10.5)
nRBC: 0 % (ref 0.0–0.2)

## 2021-06-06 LAB — TROPONIN I (HIGH SENSITIVITY)
Troponin I (High Sensitivity): 6 ng/L (ref <18)
Troponin I (High Sensitivity): 7 ng/L (ref <18)

## 2021-06-06 MED ORDER — BACLOFEN 5 MG HALF TABLET
5.0000 mg | ORAL_TABLET | Freq: Once | ORAL | Status: AC
  Administered 2021-06-06: 5 mg via ORAL
  Filled 2021-06-06: qty 1

## 2021-06-06 MED ORDER — OXYCODONE HCL 5 MG PO TABS
5.0000 mg | ORAL_TABLET | Freq: Once | ORAL | Status: AC
  Administered 2021-06-06: 5 mg via ORAL
  Filled 2021-06-06: qty 1

## 2021-06-06 MED ORDER — ONDANSETRON 4 MG PO TBDP
8.0000 mg | ORAL_TABLET | Freq: Once | ORAL | Status: AC
  Administered 2021-06-06: 8 mg via ORAL
  Filled 2021-06-06: qty 2

## 2021-06-06 MED ORDER — BACLOFEN 5 MG HALF TABLET: 5.0000 mg | ORAL_TABLET | Freq: Three times a day (TID) | ORAL | Status: DC

## 2021-06-06 NOTE — ED Provider Triage Note (Signed)
Emergency Medicine Provider Triage Evaluation Note  [name] , a 51 y.o. male  was evaluated in triage.  Pt complains of back pain for years. Pain feels worse after walking for a long period of time recently. He also c/o intermittent chest pain.  Review of Systems  Positive: Chest pain, back pain Negative: Numbness/weakness, incontinence  Physical Exam  BP 136/82 (BP Location: Right Arm)    Pulse 62    Temp 98.2 F (36.8 C) (Oral)    Resp 16    SpO2 95%  Gen:   Awake, no distress   Resp:  Normal effort  MSK:   Moves extremities without difficulty  Other:  Heart rrr, lungs ctab  Medical Decision Making  Medically screening exam initiated at 12:46 AM.  Appropriate orders placed.  [name] was informed that the remainder of the evaluation will be completed by another provider, this initial triage assessment does not replace that evaluation, and the importance of remaining in the ED until their evaluation is complete.     Karrie Meres, [place] 06/06/21 [phone]

## 2021-06-06 NOTE — ED Triage Notes (Signed)
Patient reports chronic pain at entire back for several years worse today , diagnosed with arthritis, denies fall or injury.

## 2021-06-06 NOTE — ED Provider Notes (Signed)
[facility] EMERGENCY DEPARTMENT Provider Note   CSN: [id number] Arrival date & time: 06/06/21  0021     History  Chief Complaint  Patient presents with   Back Pain    [name] is a 51 y.o. male.   Back Pain Associated symptoms: chest pain   Associated symptoms: no fever and no weakness   Patient with history of chronic back pain presents with back pain.  He reports it hurts all over his back.  No recent trauma.  He reports the pain is in all directions in his back.  No new weakness. No fevers are reported. He reports he can ambulate but it feels painful. He briefly reported left-sided chest pain but did not give any further details.   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  Cardiovascular:  Positive for chest pain.  Musculoskeletal:  Positive for back pain.  Neurological:  Negative for weakness.   Physical Exam Updated Vital Signs BP 136/82 (BP Location: Right Arm)    Pulse 62    Temp 98.2 F (36.8 C) (Oral)    Resp 16    SpO2 95%  Physical Exam CONSTITUTIONAL: Disheveled, resting comfortably HEAD: Normocephalic/atraumatic EYES: EOMI/PERRL ENMT: Mucous membranes moist NECK: supple no meningeal signs SPINE/BACK: Diffuse thoracic and lumbar tenderness, no bruising/crepitance/stepoffs noted to spine Thoracic and lumbar paraspinal tenderness CV: S1/S2 noted, no murmurs/rubs/gallops noted LUNGS: Lungs are clear to auscultation bilaterally, no apparent distress Chest-no bruising or crepitus.  No rashes noted ABDOMEN: soft, nontender NEURO: Awake/alert, equal motor 5/5 strength noted with the following: hip  flexion/knee flexion/extension, foot dorsi/plantar flexion no sensory deficit in any dermatome.   Pt is able to ambulate with antalgic gait. EXTREMITIES: pulses normal, full ROM, no deformities SKIN: warm, color normal PSYCH: no abnormalities of mood noted, alert and oriented to situation   ED Results / Procedures / Treatments   Labs (all labs ordered are listed, but only abnormal results are displayed) Labs Reviewed  BASIC METABOLIC PANEL - Abnormal; Notable for the following components:      Result Value   Potassium 3.3 (*)    Glucose, Bld 112 (*)    All other components within normal limits  CBC - Abnormal; Notable for the following components:   RBC 3.82 (*)    Hemoglobin 11.7 (*)    HCT 36.7 (*)    All other components within normal limits  TROPONIN I (HIGH SENSITIVITY)  TROPONIN I (HIGH SENSITIVITY)    EKG EKG Interpretation  Date/Time:  Friday June 06 2021 00:55:44 EST Ventricular Rate:  67 PR Interval:  142 QRS Duration: 90 QT Interval:  412 QTC Calculation: 435 R Axis:   76 Text Interpretation: Normal sinus rhythm Minimal voltage criteria for LVH, may be normal variant ( Sokolow-Lyon ) Borderline ECG Confirmed by Ripley Fraise [phone]) on 06/06/2021 3:47:18 AM  Radiology DG Chest Port 1 View  Result Date: 06/06/2021 CLINICAL DATA:  Chest pain EXAM: PORTABLE CHEST 1 VIEW COMPARISON:  None. FINDINGS: The heart size and mediastinal contours are within normal limits. Both lungs are clear. The visualized skeletal structures  are unremarkable. IMPRESSION: No active disease. Electronically Signed   By: Fidela Salisbury M.D.   On: 06/06/2021 01:04    Procedures Procedures    Medications Ordered in ED Medications  ondansetron (ZOFRAN-ODT) disintegrating tablet 8 mg (has no administration in time range)  oxyCODONE (Oxy IR/ROXICODONE) immediate release tablet 5 mg (has no administration in time range)  baclofen (LIORESAL) tablet 5 mg (5 mg Oral Given 06/06/21 0126)     ED Course/ Medical Decision Making/ A&P                           Medical Decision Making Risk Prescription drug management.   Patient w/history of chronic back pain presents with worsening back pain.  Denies any recent trauma.  No focal weakness.  He had previously denied incontinence.  There is no signs of any traumatic injury.  When I went to evaluate the patient, he was sound asleep.  He then woke up and reported he was in excruciating pain and requesting pain meds He has no focal neurodeficits and can ambulate with an antalgic gait.  He had briefly mentioned episodes of chest pain but did not really provide any further details and reports his main issue is his back pain  I personally reviewed chest x-ray and labs and there is no acute findings at this time.  EKG reveals LVH pattern Patient be given meds here, he is requesting food and will be discharged home       Final Clinical Impression(s) / ED Diagnoses Final diagnoses:  Chronic midline thoracic back pain  Chronic midline low back pain without sciatica    Rx / DC Orders ED Discharge Orders     None         Ripley Fraise, MD 06/06/21 [phone]

## 2021-06-06 NOTE — ED Notes (Signed)
Pt discharged and ambulated out of the ED without difficulty. 

## 2021-06-06 NOTE — Discharge Instructions (Signed)

## 2021-06-07 ENCOUNTER — Telehealth (HOSPITAL_COMMUNITY): Payer: Self-pay

## 2021-09-28 ENCOUNTER — Other Ambulatory Visit: Payer: Self-pay

## 2021-09-28 ENCOUNTER — Encounter (HOSPITAL_COMMUNITY): Payer: Self-pay | Admitting: Emergency Medicine

## 2021-09-28 ENCOUNTER — Emergency Department (HOSPITAL_COMMUNITY)
Admission: EM | Admit: 2021-09-28 | Discharge: 2021-09-29 | Disposition: A | Discharge status: Other Acute Inpatient Hospital | Payer: Self-pay | Attending: Emergency Medicine | Admitting: Emergency Medicine

## 2021-09-28 DIAGNOSIS — R45851 Suicidal ideations: Secondary | ICD-10-CM | POA: Insufficient documentation

## 2021-09-28 DIAGNOSIS — F1910 Other psychoactive substance abuse, uncomplicated: Secondary | ICD-10-CM | POA: Diagnosis present

## 2021-09-28 DIAGNOSIS — Z20822 Contact with and (suspected) exposure to covid-19: Secondary | ICD-10-CM | POA: Insufficient documentation

## 2021-09-28 DIAGNOSIS — F332 Major depressive disorder, recurrent severe without psychotic features: Secondary | ICD-10-CM | POA: Diagnosis present

## 2021-09-28 DIAGNOSIS — Z720 Tobacco use: Secondary | ICD-10-CM | POA: Insufficient documentation

## 2021-09-28 DIAGNOSIS — Z79899 Other long term (current) drug therapy: Secondary | ICD-10-CM | POA: Insufficient documentation

## 2021-09-28 DIAGNOSIS — F17200 Nicotine dependence, unspecified, uncomplicated: Secondary | ICD-10-CM | POA: Diagnosis present

## 2021-09-28 LAB — RAPID URINE DRUG SCREEN, HOSP PERFORMED
Amphetamines: NOT DETECTED
Barbiturates: NOT DETECTED
Benzodiazepines: NOT DETECTED
Cocaine: NOT DETECTED
Opiates: NOT DETECTED
Tetrahydrocannabinol: POSITIVE — AB

## 2021-09-28 LAB — ACETAMINOPHEN LEVEL: Acetaminophen (Tylenol), Serum: <10 ug/mL — ABNORMAL LOW (ref 10–30)

## 2021-09-28 LAB — COMPREHENSIVE METABOLIC PANEL
ALT: 11 U/L (ref 0–44)
AST: 19 U/L (ref 15–41)
Albumin: 4.1 g/dL (ref 3.5–5.0)
Alkaline Phosphatase: 36 U/L — ABNORMAL LOW (ref 38–126)
Anion gap: 6 (ref 5–15)
BUN: 9 mg/dL (ref 6–20)
CO2: 30 mmol/L (ref 22–32)
Calcium: 9.7 mg/dL (ref 8.9–10.3)
Chloride: 105 mmol/L (ref 98–111)
Creatinine, Ser: 1.08 mg/dL (ref 0.61–1.24)
GFR, Estimated: >60 mL/min (ref >60)
Glucose, Bld: 115 mg/dL — ABNORMAL HIGH (ref 70–99)
Potassium: 3.7 mmol/L (ref 3.5–5.1)
Sodium: 141 mmol/L (ref 135–145)
Total Bilirubin: 0.5 mg/dL (ref 0.3–1.2)
Total Protein: 6.9 g/dL (ref 6.5–8.1)

## 2021-09-28 LAB — CBC
HCT: 39.7 % (ref 39.0–52.0)
Hemoglobin: 12.4 g/dL — ABNORMAL LOW (ref 13.0–17.0)
MCH: 30.3 pg (ref 26.0–34.0)
MCHC: 31.2 g/dL (ref 30.0–36.0)
MCV: 97.1 fL (ref 80.0–100.0)
Platelets: 277 10*3/uL (ref 150–400)
RBC: 4.09 MIL/uL — ABNORMAL LOW (ref 4.22–5.81)
RDW: 12.9 % (ref 11.5–15.5)
WBC: 8.7 10*3/uL (ref 4.0–10.5)
nRBC: 0 % (ref 0.0–0.2)

## 2021-09-28 LAB — SALICYLATE LEVEL: Salicylate Lvl: <7 mg/dL — ABNORMAL LOW (ref 7.0–30.0)

## 2021-09-28 LAB — ETHANOL: Alcohol, Ethyl (B): <10 mg/dL (ref <10)

## 2021-09-28 NOTE — ED Triage Notes (Signed)
Pt reported to ED with c/o chronic lower back pain and suicidal ideation. Pt states "I've been having a lot of thoughts lately about harming myself and I do not like the way it feels". Pt states he has no specific plan at this time.

## 2021-09-29 ENCOUNTER — Inpatient Hospital Stay (HOSPITAL_COMMUNITY)
Admission: AD | Admit: 2021-09-29 | Discharge: 2021-10-02 | DRG: 885 | Disposition: A | Discharge status: Home / Self Care | Payer: Federal, State, Local not specified - Other | Source: Intra-hospital | Attending: Emergency Medicine | Admitting: Emergency Medicine

## 2021-09-29 ENCOUNTER — Encounter (HOSPITAL_COMMUNITY): Payer: Self-pay | Admitting: Adult Health

## 2021-09-29 DIAGNOSIS — S8991XA Unspecified injury of right lower leg, initial encounter: Secondary | ICD-10-CM | POA: Diagnosis present

## 2021-09-29 DIAGNOSIS — Z87820 Personal history of traumatic brain injury: Secondary | ICD-10-CM | POA: Diagnosis not present

## 2021-09-29 DIAGNOSIS — Z8249 Family history of ischemic heart disease and other diseases of the circulatory system: Secondary | ICD-10-CM | POA: Diagnosis not present

## 2021-09-29 DIAGNOSIS — G8929 Other chronic pain: Secondary | ICD-10-CM | POA: Diagnosis present

## 2021-09-29 DIAGNOSIS — F17210 Nicotine dependence, cigarettes, uncomplicated: Secondary | ICD-10-CM | POA: Diagnosis present

## 2021-09-29 DIAGNOSIS — Y9389 Activity, other specified: Secondary | ICD-10-CM | POA: Diagnosis not present

## 2021-09-29 DIAGNOSIS — F1220 Cannabis dependence, uncomplicated: Secondary | ICD-10-CM

## 2021-09-29 DIAGNOSIS — F419 Anxiety disorder, unspecified: Secondary | ICD-10-CM | POA: Diagnosis present

## 2021-09-29 DIAGNOSIS — R45851 Suicidal ideations: Secondary | ICD-10-CM | POA: Diagnosis present

## 2021-09-29 DIAGNOSIS — M545 Low back pain, unspecified: Secondary | ICD-10-CM | POA: Diagnosis present

## 2021-09-29 DIAGNOSIS — F1290 Cannabis use, unspecified, uncomplicated: Secondary | ICD-10-CM

## 2021-09-29 DIAGNOSIS — X500XXA Overexertion from strenuous movement or load, initial encounter: Secondary | ICD-10-CM

## 2021-09-29 DIAGNOSIS — Y990 Civilian activity done for income or pay: Secondary | ICD-10-CM

## 2021-09-29 DIAGNOSIS — M79604 Pain in right leg: Secondary | ICD-10-CM | POA: Diagnosis present

## 2021-09-29 DIAGNOSIS — F332 Major depressive disorder, recurrent severe without psychotic features: Secondary | ICD-10-CM | POA: Diagnosis present

## 2021-09-29 DIAGNOSIS — G4700 Insomnia, unspecified: Secondary | ICD-10-CM | POA: Diagnosis present

## 2021-09-29 DIAGNOSIS — Z634 Disappearance and death of family member: Secondary | ICD-10-CM

## 2021-09-29 LAB — SARS CORONAVIRUS 2 BY RT PCR: SARS Coronavirus 2 by RT PCR: NEGATIVE

## 2021-09-29 MED ORDER — NICOTINE 14 MG/24HR TD PT24
14.0000 mg | MEDICATED_PATCH | Freq: Every day | TRANSDERMAL | Status: DC
  Administered 2021-09-30 – 2021-10-02 (×3): 14 mg via TRANSDERMAL
  Filled 2021-09-29 (×4): qty 1

## 2021-09-29 MED ORDER — NICOTINE 14 MG/24HR TD PT24
14.0000 mg | MEDICATED_PATCH | Freq: Every day | TRANSDERMAL | Status: DC
Start: 2021-09-29 — End: 2021-09-29
  Administered 2021-09-29: 14 mg via TRANSDERMAL
  Filled 2021-09-29: qty 1

## 2021-09-29 MED ORDER — ENSURE ENLIVE PO LIQD
237.0000 mL | Freq: Two times a day (BID) | ORAL | Status: DC
  Administered 2021-09-30 – 2021-10-01 (×3): 237 mL via ORAL
  Filled 2021-09-29 (×7): qty 237

## 2021-09-29 MED ORDER — TRAMADOL HCL 50 MG PO TABS
50.0000 mg | ORAL_TABLET | Freq: Once | ORAL | Status: AC
  Administered 2021-09-29: 50 mg via ORAL
  Filled 2021-09-29: qty 1

## 2021-09-29 MED ORDER — MIRTAZAPINE 15 MG PO TBDP
15.0000 mg | ORAL_TABLET | Freq: Every day | ORAL | Status: DC
  Administered 2021-09-29 – 2021-10-01 (×3): 15 mg via ORAL
  Filled 2021-09-29 (×5): qty 1

## 2021-09-29 MED ORDER — GABAPENTIN 100 MG PO CAPS
100.0000 mg | ORAL_CAPSULE | Freq: Three times a day (TID) | ORAL | Status: DC
  Administered 2021-09-29 – 2021-10-01 (×6): 100 mg via ORAL
  Filled 2021-09-29 (×9): qty 1

## 2021-09-29 MED ORDER — HYDROXYZINE HCL 25 MG PO TABS
25.0000 mg | ORAL_TABLET | Freq: Three times a day (TID) | ORAL | Status: DC | PRN
  Administered 2021-09-30 – 2021-10-01 (×2): 25 mg via ORAL
  Filled 2021-09-29 (×3): qty 1

## 2021-09-29 MED ORDER — METHOCARBAMOL 500 MG PO TABS
500.0000 mg | ORAL_TABLET | Freq: Three times a day (TID) | ORAL | Status: DC | PRN
  Administered 2021-09-29 – 2021-10-01 (×5): 500 mg via ORAL
  Filled 2021-09-29 (×6): qty 1

## 2021-09-29 MED ORDER — MIRTAZAPINE 15 MG PO TBDP: 15.0000 mg | ORAL_TABLET | Freq: Every day | ORAL | Status: DC

## 2021-09-29 MED ORDER — HYDROXYZINE HCL 25 MG PO TABS
25.0000 mg | ORAL_TABLET | Freq: Three times a day (TID) | ORAL | Status: DC | PRN
  Administered 2021-09-29: 25 mg via ORAL
  Filled 2021-09-29: qty 1

## 2021-09-29 MED ORDER — GABAPENTIN 100 MG PO CAPS
100.0000 mg | ORAL_CAPSULE | Freq: Three times a day (TID) | ORAL | Status: DC
  Administered 2021-09-29: 100 mg via ORAL
  Filled 2021-09-29: qty 1

## 2021-09-29 NOTE — Progress Notes (Signed)
Pt was accepted to [facility] 09/29/21; Bed Assignment 307-1  Dx: MDD.  Pt meets inpatient criteria per Ophelia Shoulder NP  Attending Physician will be Mason Jim, MD  Report can be called to: Adult unit: [phone]   Fax Vol consent signed to [phone]  Care Team notified: Ruffin Frederick, RN, Ophelia Shoulder, NP, [facility] Rona Ravens, RN, and [place].    Kelton Pillar, LCSWA 09/29/2021 @ 11:42 AM

## 2021-09-29 NOTE — ED Notes (Signed)
Pt ttsd calm interactive  co-operative

## 2021-09-29 NOTE — Progress Notes (Signed)
Pt meets inpatient criteria per Ophelia Shoulder, NP. Per [facility] Rona Ravens, RN pt can be accepted to [facility] PENDING pt's ambulation status and the ability for pt to complete their own ADL's.  Care Team notified: Ruffin Frederick, RN, Ophelia Shoulder, NP, and [facility] Rona Ravens, RN.   Maryjean Ka, MSW, [facility] 09/29/2021 11:36 AM

## 2021-09-29 NOTE — Progress Notes (Signed)
Admission note: Patient is a 51 year old male, admitted from [place] ED for suicidal ideation with no plan. Patient arrived to the unit via safe transport, alert and oriented to place, person and situation. Patient denies SI/HI/AVH on admission, he states he does not have any plan to harm himself at this moment. Patient reports he lost 11 LB due to poor appetite. He states he is still grieving the loss of his 26 years old first daughter, who passed in 2020, during covid. Per patient, "I live in a 2 bedroom apartment, I do not have any family support." Patient further states his family lives out of state. Patient states she would like to work on depression and his grieve while on the unit. During admission, patient briefly mentioned that he hears voices, but refused to elaborate on it at the time of admission. He states he would prefer talking to the Doctor about his auditory hallucination.  Pt oriented to unit, room and routine. Information packet given to patient and safety information was also discussed with patient.  Admission INP armband ID verified with patient, and in place. Fall risk assessment completed with Patient and  he verbalized understanding of risks associated with falls. No contraband found during skin assessment, Skin, clean-dry- intact without evidence of bruising, or skin tears and tracks marks. Q 15 minutes safety observation initiated and in place. Staff will continue to provide support to patient.

## 2021-09-29 NOTE — ED Provider Notes (Addendum)
[facility] EMERGENCY DEPARTMENT Provider Note   CSN: [id number] Arrival date & time: 09/28/21  2126     History  Chief Complaint  Patient presents with   Suicidal    [name] is a 51 y.o. male with medical history of severe major depression with psychotic features, alcohol use, anemia, colitis, chronic back pain.    Presents to the emergency department with a chief complaint of suicidal ideation.  Patient reports that he is having suicidal thoughts over the last few days.  Patient told previous provider "I have been having a lot of thoughts lately about harming myself and I do not like the way it feels."  Patient denies any plan of suicide. Patient reports "I did not want to be alone tonight."  When asked if patient has intent to go through suicidal thoughts patient states "I am scared if I keep thinking about them or come up with a plan."  Patient reports living in an apartment here.  Patient is originally from [place] and does not have any friends or family nearby.  Patient denies any previous episodes of suicidal ideation.  Patient denies any specific stressor of the last few days to initiate these thoughts of suicide however states that he did lose his daughter to COVID-19 in 2020 and is under financial struggles at this time.  When asked patient does endorse auditory hallucinations.  When asked to clarify patient states ""I hear quite a bit, thoughts that make me want to do stuff."  Patient denies any homicidal ideations, or visual hallucinations.  Patient endorses marijuana use.  Last used marijuana yesterday.  Patient denies any alcohol use.  Ports that he is not on any medication for his depression.  Patient does not see any counseling or psychiatrist in the outpatient setting.  HPI     Home Medications Prior to Admission medications   Not on File      Allergies    Acetaminophen, Aspirin, Ibuprofen, Pork-derived products, and Sertraline     Review of Systems   Review of Systems  Constitutional:  Negative for chills and fever.  Eyes:  Negative for visual disturbance.  Respiratory:  Negative for shortness of breath.   Cardiovascular:  Negative for chest pain.  Gastrointestinal:  Negative for abdominal pain, nausea and vomiting.  Genitourinary:  Negative for difficulty urinating and dysuria.  Musculoskeletal:  Positive for back pain (chronic). Negative for neck pain.  Skin:  Negative for color change and rash.  Neurological:  Negative for dizziness, syncope, light-headedness and headaches.  Psychiatric/Behavioral:  Positive for hallucinations and suicidal ideas. Negative for confusion and self-injury.    Physical Exam Updated Vital Signs BP 127/75 (BP Location: Right Arm)   Pulse 70   Temp 98.5 F (36.9 C) (Oral)   Resp 16   SpO2 98%  Physical Exam Vitals and nursing note reviewed.  Constitutional:      General: He is not in acute distress.    Appearance: He is not ill-appearing, toxic-appearing or diaphoretic.  HENT:     Head: Normocephalic.  Eyes:     General: No scleral icterus.       Right eye: No discharge.        Left eye: No discharge.  Cardiovascular:     Rate and Rhythm: Normal rate.  Pulmonary:     Effort: Pulmonary effort is normal.  Skin:    General: Skin is warm and dry.  Neurological:     General: No focal deficit present.  Mental Status: He is alert and oriented to person, place, and time.     GCS: GCS eye subscore is 4. GCS verbal subscore is 5. GCS motor subscore is 6.  Psychiatric:        Attention and Perception: He is attentive. He perceives auditory hallucinations. He does not perceive visual hallucinations.        Mood and Affect: Mood is depressed.        Speech: Speech normal.        Behavior: Behavior is cooperative.        Thought Content: Thought content is not paranoid or delusional. Thought content includes suicidal ideation. Thought content does not include homicidal  ideation. Thought content does not include homicidal or suicidal plan.    ED Results / Procedures / Treatments   Labs (all labs ordered are listed, but only abnormal results are displayed) Labs Reviewed  COMPREHENSIVE METABOLIC PANEL - Abnormal; Notable for the following components:      Result Value   Glucose, Bld 115 (*)    Alkaline Phosphatase 36 (*)    All other components within normal limits  SALICYLATE LEVEL - Abnormal; Notable for the following components:   Salicylate Lvl <7.0 (*)    All other components within normal limits  ACETAMINOPHEN LEVEL - Abnormal; Notable for the following components:   Acetaminophen (Tylenol), Serum <10 (*)    All other components within normal limits  CBC - Abnormal; Notable for the following components:   RBC 4.09 (*)    Hemoglobin 12.4 (*)    All other components within normal limits  RAPID URINE DRUG SCREEN, HOSP PERFORMED - Abnormal; Notable for the following components:   Tetrahydrocannabinol POSITIVE (*)    All other components within normal limits  ETHANOL    EKG None  Radiology No results found.  Procedures Procedures    Medications Ordered in ED Medications - No data to display  ED Course/ Medical Decision Making/ A&P                           Medical Decision Making Amount and/or Complexity of Data Reviewed Labs: ordered.   Alert 51 year old male in no acute distress, nontoxic-appearing.  Presents to the emergency department with a chief complaint of suicidal ideations.  Information obtained from patient.  Past medical records were reviewed including previous provider notes, labs, and imaging.  Patient has medical history as outlined in HPI which complicates his care.  Medical clearance labs were ordered while patient was in triage.  I personally viewed and interpreted patient's lab results.  Pertinent findings include: -CMP, acetaminophen, salicylate, and ethanol levels unremarkable -Anemia with hemoglobin at  12.4, appears baseline for patient. -UDS positive for marijuana  While patient has no active plan I am concerned he may develop 1 due to the statements he has made to me.  We will consult TTS at this time.  Patient is medically cleared.  No home medications to reorder.  Roselyn BeringShalon Bobbitt, NP, reviewed pt's chart and information and determined pt should receive continuous assessment and be re-assessed by psychiatry in the morning. [facility] after a negative PCR test results.   Duard BradySamantha Kaufman LMFT will need to be contacted once patient's COVID results are resulted to discuss further transfer [facility].   COVID testing was ordered for patient.  Upon receiving message from  Duard BradySamantha Kaufman LMFT at [phone]    Final Clinical Impression(s) /  ED Diagnoses Final diagnoses:  None    Rx / DC Orders ED Discharge Orders     None         Haskel Schroeder, PA-C 09/29/21 0127    Haskel Schroeder, PA-C 09/29/21 [id number]    Sabas Sous, MD 09/29/21 [phone]

## 2021-09-29 NOTE — ED Notes (Signed)
The pt is not communicating  covered his head with the blanket

## 2021-09-29 NOTE — BH Assessment (Signed)
Comprehensive Clinical Assessment (CCA) Note  09/29/2021 [name] [id number]  Discharge Disposition: [name] Bering, NP, reviewed pt's chart and information and determined pt should receive continuous assessment and be re-assessed by psychiatry in the morning. Pt will be reviewed for transfer to the [facility] after a negative PCR test results. This information was relayed to pt's team at 0344.  The patient demonstrates the following risk factors for suicide: Chronic risk factors for suicide include: psychiatric disorder of MDD, Recurrent, Moderate . Acute risk factors for suicide include: family or marital conflict, unemployment, social withdrawal/isolation, and loss (financial, interpersonal, professional). Protective factors for this patient include: responsibility to others (children, family) and hope for the future. Considering these factors, the overall suicide risk at this point appears to be moderate. Patient is not appropriate for outpatient follow up.  Therefore, a 1:2 sitter is recommended for suicide precautions.  Flowsheet Row ED from 09/28/2021 in [facility] EMERGENCY DEPARTMENT ED from 06/06/2021 in [facility] EMERGENCY DEPARTMENT ED from 04/02/2021 in [facility] EMERGENCY DEPARTMENT  C-SSRS RISK CATEGORY Moderate Risk No Risk No Risk     Chief Complaint:  Chief Complaint  Patient presents with   Suicidal   Visit Diagnosis: MDD, Recurrent, Moderate   CCA Screening, Triage and Referral (STR) [name] is a 51 year old patient who came to the [facility] due to chronic lower back pain and SI. Pt states, "I'm in the Emergency Department tonight because I got tired of hearing voices. I got tired of having negative thoughts in my mind pertaining to harming myself or killing myself. Most of the time I can't want to live. It got a lot worse when I lost my daughter in 08/19/18- she died of the Coronavirus. I've been stressing wtih  that and sometimes I just want to swallow a bunch of pills."   Pt endorses SI and a hx of SI. He denies he's ever attempted to kill himself. Pt states he has been hospitalized in the past, most recently at [facility] (03/17/2019 - 03/20/2019 and 06/09/2018 - 06/13/2018). Pt denies he has a plan to kill himself, though then states, "If I had some pills...."   Pt denies HI, VH, NSSIB, access to guns (he acknowledges he has knives and "blades"), engagement with the legal system (with the exception of stress re: paying his child support), and the use of substances (with the exception of daily marijuana use). Of note, pt's UDA was negative for all substances except THC.  Pt is oriented x5. His recent/remote memory is intact. Pt was cooperative throughout the assessment process. Pt's insight, judgement, and impulse control is impaired at this time.  Patient Reported Information How did you hear about [place]? Self  What Is the Reason for Your Visit/Call Today? Pt states, "I'm in the Emergency Department tonight because I got tired of hearing voices. I got tired of having negative thoughts in my mind pertaining to harming myself or killing myself. Most of the time I can't want to live. It got a lot worse when I lost my daughter in 08-19-2018- she died of the Coronavirus. I've been stressing wtih that and sometimes I just want to swallow a bunch of pills." Pt endorses SI and a hx of SI. He denies he's ever attempted to kill himself. Pt states he has been hospitalized in the past, most recently at [facility] (03/17/2019 - 03/20/2019 and 06/09/2018 - 06/13/2018). Pt denies he has a plan to kill himself, though then  states, "If I had some pills...." Pt denies HI, VH, NSSIB, access to guns (he acknowledges he has knives and "blades"), engagement with the legal system (with the exception of stress re: paying his child support), and the use of substances (with the exception of daily marijuana use). Of note, pt's UDA was negative for all  substances except THC.  How Long Has This Been Causing You Problems? > than 6 months  What Do You Feel Would Help You the Most Today? Treatment for Depression or other mood problem; Medication(s)   Have You Recently Had Any Thoughts About Hurting Yourself? Yes  Are You Planning to Commit Suicide/Harm Yourself At This time? -- (Pt denies, but then states, "If I had some pills.Marland Kitchen..")   Have you Recently Had Thoughts About Hurting Someone [name]? No  Are You Planning to Harm Someone at This Time? No  Explanation: No data recorded  Have You Used Any Alcohol or Drugs in the Past 24 Hours? Yes  How Long Ago Did You Use Drugs or Alcohol? No data recorded What Did You Use and How Much? Daily marijuana use; amount varies   Do You Currently Have a Therapist/Psychiatrist? No  Name of Therapist/Psychiatrist: No data recorded  Have You Been Recently Discharged From Any Office Practice or Programs? No  Explanation of Discharge From Practice/Program: No data recorded    CCA Screening Triage Referral Assessment Type of Contact: Tele-Assessment  Telemedicine Service Delivery: Telemedicine service delivery: This service was provided via telemedicine using a 2-way, interactive audio and video technology  Is this Initial or Reassessment? Initial Assessment  Date Telepsych consult ordered in [facility]:  09/28/21  Time Telepsych consult ordered in [facility]:  0125  Location of Assessment: [facility] ED  Provider Location: [facility] Assessment Services   Collateral Involvement: Pt declined   Does Patient Have a Automotive engineer Guardian? No data recorded Name and Contact of Legal Guardian: No data recorded If Minor and Not Living with Parent(s), Who has Custody? N/A  Is CPS involved or ever been involved? Never  Is APS involved or ever been involved? Never   Patient Determined To Be At Risk for Harm To Self or Others Based on Review of Patient Reported Information or Presenting Complaint? Yes, for  Self-Harm  Method: No data recorded Availability of Means: No data recorded Intent: No data recorded Notification Required: No data recorded Additional Information for Danger to Others Potential: No data recorded Additional Comments for Danger to Others Potential: No data recorded Are There Guns or Other Weapons in Your Home? No data recorded Types of Guns/Weapons: No data recorded Are These Weapons Safely Secured?                            No data recorded Who Could Verify You Are Able To Have These Secured: No data recorded Do You Have any Outstanding Charges, Pending Court Dates, Parole/Probation? No data recorded Contacted To Inform of Risk of Harm To Self or Others: Unable to Contact: (Pt says he has no one for clinician to contact)    Does Patient Present under Involuntary Commitment? No  IVC Papers Initial File Date:  (N/A)   County of Residence: [place]   Patient Currently Receiving the Following Services: Not Receiving Services   Determination of Need: Urgent (48 hours)   Options For Referral: Medication Management; Outpatient Therapy; BH Urgent Care     CCA Biopsychosocial Patient Reported Schizophrenia/Schizoaffective Diagnosis in Past: No  Strengths: Pt is seeking assistance for his mental health concerns. He answered the questions posed. Pt is able to identify his thoughts, feelings, and concerns.   Mental Health Symptoms Depression:   Difficulty Concentrating; Hopelessness; Worthlessness; Increase/decrease in appetite; Sleep (too much or little)   Duration of Depressive symptoms:  Duration of Depressive Symptoms: Greater than two weeks   Mania:   None   Anxiety:    Worrying; Tension; Sleep   Psychosis:   None   Duration of Psychotic symptoms:    Trauma:   Emotional numbing; Detachment from others   Obsessions:   None   Compulsions:   None   Inattention:   None   Hyperactivity/Impulsivity:   None   Oppositional/Defiant  Behaviors:   None   Emotional Irregularity:   Potentially harmful impulsivity; Mood lability; Recurrent suicidal behaviors/gestures/threats   Other Mood/Personality Symptoms:   None noted    Mental Status Exam Appearance and self-care  Stature:   Average   Weight:   Thin   Clothing:   -- (Hospital scrubs)   Grooming:   Normal   Cosmetic use:   None   Posture/gait:   Normal   Motor activity:   Not Remarkable   Sensorium  Attention:   Normal   Concentration:   Normal   Orientation:   X5   Recall/memory:   Normal   Affect and Mood  Affect:   Anxious; Depressed   Mood:   Anxious; Depressed   Relating  Eye contact:   Normal   Facial expression:   Responsive   Attitude toward examiner:   Argumentative   Thought and Language  Speech flow:  Clear and Coherent   Thought content:   Appropriate to Mood and Circumstances   Preoccupation:   None   Hallucinations:   Auditory   Organization:  No data recorded  Affiliated Computer ServicesExecutive Functions  Fund of Knowledge:   Average   Intelligence:   Average   Abstraction:   Functional   Judgement:   Impaired   Reality Testing:   Adequate   Insight:   Lacking   Decision Making:   Impulsive; Only simple   Social Functioning  Social Maturity:   Isolates   Social Judgement:   Heedless; Naive   Stress  Stressors:   Family conflict; Grief/losses; Housing; Surveyor, quantityinancial; Work   Coping Ability:   Deficient supports; Overwhelmed   Skill Deficits:   Decision making; Responsibility   Supports:   Support needed     Religion: Religion/Spirituality Are You A Religious Person?: Yes What is Your Religious Affiliation?: Christian How Might This Affect Treatment?: Not assessed  Leisure/Recreation: Leisure / Recreation Do You Have Hobbies?:  (Not assessed)  Exercise/Diet: Exercise/Diet Do You Exercise?: Yes What Type of Exercise Do You Do?: Run/Walk How Many Times a Week Do You Exercise?: 6-7  times a week Have You Gained or Lost A Significant Amount of Weight in the Past Six Months?: No Do You Follow a Special Diet?: No Do You Have Any Trouble Sleeping?: Yes Explanation of Sleeping Difficulties: Pt states he "cat naps" and that he has difficulties falling asleep   CCA Employment/Education Employment/Work Situation: Employment / Work Situation Employment Situation: Unemployed Patient's Job has Been Impacted by Current Illness: No Has Patient ever Been in Equities traderthe Military?: No  Education: Education Is Patient Currently Attending School?: No Last Grade Completed:  (GED) Did You Attend College?: No Did You Have An Individualized Education Program (IIEP):  (Not assessed) Did You Have Any Difficulty  At [facility]?:  (Not assessed) Patient's Education Has Been Impacted by Current Illness: No   CCA Family/Childhood History Family and Relationship History: Family history Marital status: Single Does patient have children?: Yes How many children?: 4 How is patient's relationship with their children?: Per chart: deceased son, deceased daughter (died in May 08, 2020from COVID); younger daughter (wonderful relationship) - older daughter (not a good relationship because of his alcoholism)  Childhood History:  Childhood History By whom was/is the patient raised?: Both parents Did patient suffer any verbal/emotional/physical/sexual abuse as a child?: No Did patient suffer from severe childhood neglect?: No Has patient ever been sexually abused/assaulted/raped as an adolescent or adult?: No Was the patient ever a victim of a crime or a disaster?: No Witnessed domestic violence?: No Has patient been affected by domestic violence as an adult?: No  Child/Adolescent Assessment:     CCA Substance Use Alcohol/Drug Use: Alcohol / Drug Use Pain Medications: See MAR Prescriptions: See MAR Over the Counter: See MAR History of alcohol / drug use?: Yes Longest period of sobriety (when/how  long): Per chart pt has a hx of alcoholism but denies he currently drinks. Pt smokes marijuana on a daily basis. Negative Consequences of Use: Financial, Personal relationships (Per chart) Withdrawal Symptoms: Sweats, Tremors (Per chart) Substance #1 Name of Substance 1: Marijuana 1 - Age of First Use: Unknown 1 - Amount (size/oz): Varies 1 - Frequency: Daily 1 - Duration: Ongoing 1 - Last Use / Amount: 09/28/2021 1 - Method of Aquiring: Unknown 1- Route of Use: Smoke                       ASAM's:  Six Dimensions of Multidimensional Assessment  Dimension 1:  Acute Intoxication and/or Withdrawal Potential:      Dimension 2:  Biomedical Conditions and Complications:      Dimension 3:  Emotional, Behavioral, or Cognitive Conditions and Complications:     Dimension 4:  Readiness to Change:     Dimension 5:  Relapse, Continued use, or Continued Problem Potential:     Dimension 6:  Recovery/Living Environment:     ASAM Severity Score:    ASAM Recommended Level of Treatment: ASAM Recommended Level of Treatment:  (N/A)   Substance use Disorder (SUD) Substance Use Disorder (SUD)  Checklist Symptoms of Substance Use:  (N/A)  Recommendations for Services/Supports/Treatments: Recommendations for Services/Supports/Treatments Recommendations For Services/Supports/Treatments: Individual Therapy, Medication Management, Other (Comment) (Continuous Assessment at the [facility] (if COVID test results negative))  Discharge Disposition: [name] Bering, NP, reviewed pt's chart and information and determined pt should receive continuous assessment and be re-assessed by psychiatry in the morning. Pt will be reviewed for transfer to the [facility] after a negative PCR test results. This information was relayed to pt's team at 0344.  DSM5 Diagnoses: Patient Active Problem List   Diagnosis Date Noted   MDD (major depressive disorder), recurrent episode, severe (HCC) 03/17/2019   Essential hypertension  09/03/2014   Tobacco use disorder 09/03/2014   Substance abuse (HCC)    Depression    Cellulitis of nasal tip    Chronic back pain    Cellulitis of nose 08/26/2014   Cellulitis 08/26/2014   MDD (major depressive disorder), recurrent severe, without psychosis (HCC) 06/09/2014   Major depressive disorder, recurrent, severe with psychotic features (HCC) 06/08/2011   Alcohol dependence (HCC) 06/08/2011   Polysubstance abuse (HCC) 05/31/2011     Referrals to Alternative Service(s): Referred to Alternative Service(s):   Place:  Date:   Time:    Referred to Alternative Service(s):   Place:   Date:   Time:    Referred to Alternative Service(s):   Place:   Date:   Time:    Referred to Alternative Service(s):   Place:   Date:   Time:     Ralph Dowdy, LMFT

## 2021-09-29 NOTE — ED Notes (Signed)
Breakfast order placed ?

## 2021-09-29 NOTE — ED Notes (Signed)
Pt placed back to the hallway pt went back to sleep head covered

## 2021-09-29 NOTE — ED Notes (Signed)
Belongins placed in locker 10

## 2021-09-29 NOTE — Progress Notes (Signed)
The patient attended the evening A.A.meeting and was appropriate.  

## 2021-09-29 NOTE — Plan of Care (Signed)
?  Problem: Activity: ?Goal: Interest or engagement in leisure activities will improve ?Outcome: Progressing ?Goal: Imbalance in normal sleep/wake cycle will improve ?Outcome: Progressing ?  ?Problem: Coping: ?Goal: Coping ability will improve ?Outcome: Progressing ?Goal: Will verbalize feelings ?Outcome: Progressing ?  ?

## 2021-09-29 NOTE — Tx Team (Signed)
Initial Treatment Plan 09/29/2021 6:37 PM [name] JOI:[id number]    PATIENT STRESSORS: Financial difficulties   Health problems   Loss of daughter    Occupational concerns     PATIENT STRENGTHS: Average or above average intelligence  Communication skills    PATIENT IDENTIFIED PROBLEMS: Depression   Decreased appetite  Loneliness  Nervousness  Auditory Hallucination             DISCHARGE CRITERIA:  Verbal commitment to aftercare and medication compliance  PRELIMINARY DISCHARGE PLAN: Outpatient therapy Placement in alternative living arrangements Return to previous living arrangement Return to previous work or school arrangements  PATIENT/FAMILY INVOLVEMENT: This treatment plan has been presented to and reviewed with the patient, [name], The patient has been given the opportunity to ask questions and make suggestions.  Melvenia Needles, RN 09/29/2021, 6:37 PM

## 2021-09-29 NOTE — Consult Note (Signed)
Telepsych Consultation   Reason for Consult:  Psychiatric Reassessment Referring Physician:  Haskel Schroeder, PA-C Location of Patient:    [facility] Location of Provider: Other: virtual home office  Patient Identification: [name] MRN:  [id number] Principal Diagnosis: MDD (major depressive disorder), recurrent severe, without psychosis (HCC) Diagnosis:  Principal Problem:   MDD (major depressive disorder), recurrent severe, without psychosis (HCC) Active Problems:   Polysubstance abuse (HCC)   Tobacco use disorder   Total Time spent with patient: 30 minutes  Subjective:   [name] is a 51 y.o. male patient who presemted to the ED for evaluation of back pain but was recommended for 24 hour observations and AM psychiatry reassessment for voicing suicidal ideations and a plan for self harm.  HPI:   Patient seen via telepsych by this provider; chart reviewed and consulted with Dr. Lucianne May on 09/29/21.  On evaluation [name] reports he came to the ED to have his back pain evaluated and now talking with psychiatry.  States he would like to be evaluated for the reason he came.  Regarding mental health, pt endorses suicidal ideations with plan to overdose on pills, triggered by personal loss of his daughter approximately 2 years prior and many psychosocial stressors.  Most of his family/friends live in [place], he has limited protective factors.  He presents sad, endorses feelings of hopelessness, worthlessness and despair.  He cannot contract for safety and is not appropriate for outpatient care at this time.  Sleep is fair but appetite is good. He reports a history of MDD but currently not taking prescribed mental health medications.  States he was never prescribed medications on outpatient basis.  Has chronic back problems, now with acute on chronic pain. States he can ambulate independently but does report discomfort that is positional.    Per EDP Admission  Assessment 09/28/2021: Chief Complaint  Patient presents with   Suicidal      [name] is a 51 y.o. male with medical history of severe major depression with psychotic features, alcohol use, anemia, colitis, chronic back pain.     Presents to the emergency department with a chief complaint of suicidal ideation.  Patient reports that he is having suicidal thoughts over the last few days.  Patient told previous provider "I have been having a lot of thoughts lately about harming myself and I do not like the way it feels."  Patient denies any plan of suicide. Patient reports "I did not want to be alone tonight."  When asked if patient has intent to go through suicidal thoughts patient states "I am scared if I keep thinking about them or come up with a plan."   Patient reports living in an apartment here.  Patient is originally from [place] and does not have any friends or family nearby.  Patient denies any previous episodes of suicidal ideation.  Patient denies any specific stressor of the last few days to initiate these thoughts of suicide however states that he did lose his daughter to COVID-19 in 2020 and is under financial struggles at this time.   When asked patient does endorse auditory hallucinations.  When asked to clarify patient states ""I hear quite a bit, thoughts that make me want to do stuff."   Patient denies any homicidal ideations, or visual hallucinations.  Patient endorses marijuana use.  Last used marijuana yesterday.  Patient denies any alcohol use.  Ports that he is not on any medication for his depression.  Patient  does not see any counseling or psychiatrist in the outpatient setting.   Past Psychiatric History:  Per TTS admission assessment Pt states he has been hospitalized in the past, most recently at [facility] (03/17/2019 - 03/20/2019 and 06/09/2018 - 06/13/2018) for depression/mental health concerns  Risk to Self:  yes Risk to Others:  no Prior Inpatient Therapy:  yes   Prior Outpatient Therapy:  unknown  Past Medical History:  Past Medical History:  Diagnosis Date   Alcohol abuse    Anemia    Colitis    Severe major depression with psychotic features (HCC)    History reviewed. No pertinent surgical history. Family History:  Family History  Problem Relation Age of Onset   Hypertension Mother    Hypertension Father    Hypertension Other    Family Psychiatric  History: unknown Social History:  Social History   Substance and Sexual Activity  Alcohol Use No   Comment: former  last use1 year ago     Social History   Substance and Sexual Activity  Drug Use Yes   Frequency: 1.0 times per week   Types: Marijuana   Comment: former    Social History   Socioeconomic History   Marital status: Single    Spouse name: Not on file   Number of children: Not on file   Years of education: Not on file   Highest education level: Not on file  Occupational History   Not on file  Tobacco Use   Smoking status: Every Day    Packs/day: 0.25    Types: Cigarettes   Smokeless tobacco: Never  Vaping Use   Vaping Use: Never used  Substance and Sexual Activity   Alcohol use: No    Comment: former  last use1 year ago   Drug use: Yes    Frequency: 1.0 times per week    Types: Marijuana    Comment: former   Sexual activity: Yes    Birth control/protection: Condom  Other Topics Concern   Not on file  Social History Narrative   Not on file   Social Determinants of Health   Financial Resource Strain: Not on file  Food Insecurity: Not on file  Transportation Needs: Not on file  Physical Activity: Not on file  Stress: Not on file  Social Connections: Not on file   Additional Social History:    Allergies:   Allergies  Allergen Reactions   Acetaminophen Nausea Only   Aspirin Hives and Nausea And Vomiting   Ibuprofen Hives and Nausea And Vomiting   Pork-Derived Products Other (See Comments)    Stomach pain    Sertraline Diarrhea and Other  (See Comments)    Acute GI distress with diarrhea and pain.     Labs:  Results for orders placed or performed during the hospital encounter of 09/28/21 (from the past 48 hour(s))  Comprehensive metabolic panel     Status: Abnormal   Collection Time: 09/28/21  9:44 PM  Result Value Ref Range   Sodium 141 135 - 145 mmol/L   Potassium 3.7 3.5 - 5.1 mmol/L   Chloride 105 98 - 111 mmol/L   CO2 30 22 - 32 mmol/L   Glucose, Bld 115 (H) 70 - 99 mg/dL    Comment: Glucose reference range applies only to samples taken after fasting for at least 8 hours.   BUN 9 6 - 20 mg/dL   Creatinine, Ser 1.61 0.61 - 1.24 mg/dL   Calcium 9.7 8.9 - 09.6 mg/dL  Total Protein 6.9 6.5 - 8.1 g/dL   Albumin 4.1 3.5 - 5.0 g/dL   AST 19 15 - 41 U/L   ALT 11 0 - 44 U/L   Alkaline Phosphatase 36 (L) 38 - 126 U/L   Total Bilirubin 0.5 0.3 - 1.2 mg/dL   GFR, Estimated >16 >10 mL/min    Comment: (NOTE) Calculated using the CKD-EPI Creatinine Equation (2021)    Anion gap 6 5 - 15    Comment: Performed at [facility], [address] [place], [place] [address]  Ethanol     Status: None   Collection Time: 09/28/21  9:44 PM  Result Value Ref Range   Alcohol, Ethyl (B) <10 <10 mg/dL    Comment: (NOTE) Lowest detectable limit for serum alcohol is 10 mg/dL.  For medical purposes only. Performed at [facility], [address] [place], [place] [address]   Salicylate level     Status: Abnormal   Collection Time: 09/28/21  9:44 PM  Result Value Ref Range   Salicylate Lvl <7.0 (L) 7.0 - 30.0 mg/dL    Comment: Performed at [facility], [address] [place], [place] [address]  Acetaminophen level     Status: Abnormal   Collection Time: 09/28/21  9:44 PM  Result Value Ref Range   Acetaminophen (Tylenol), Serum <10 (L) 10 - 30 ug/mL    Comment: Performed at [facility], [address], [place], [place] [address]  cbc     Status: Abnormal   Collection Time: 09/28/21  9:44 PM  Result  Value Ref Range   WBC 8.7 4.0 - 10.5 K/uL   RBC 4.09 (L) 4.22 - 5.81 MIL/uL   Hemoglobin 12.4 (L) 13.0 - 17.0 g/dL   HCT 21.3 08.6 - 57.8 %   MCV 97.1 80.0 - 100.0 fL   MCH 30.3 26.0 - 34.0 pg   MCHC 31.2 30.0 - 36.0 g/dL   RDW 46.9 62.9 - 52.8 %   Platelets 277 150 - 400 K/uL   nRBC 0.0 0.0 - 0.2 %    Comment: Performed at [facility], [address] [place], [place] [address]  Rapid urine drug screen (hospital performed)     Status: Abnormal   Collection Time: 09/28/21  9:44 PM  Result Value Ref Range   Opiates NONE DETECTED NONE DETECTED   Cocaine NONE DETECTED NONE DETECTED   Benzodiazepines NONE DETECTED NONE DETECTED   Amphetamines NONE DETECTED NONE DETECTED   Tetrahydrocannabinol POSITIVE (A) NONE DETECTED   Barbiturates NONE DETECTED NONE DETECTED    Comment: (NOTE) DRUG SCREEN FOR MEDICAL PURPOSES ONLY.  IF CONFIRMATION IS NEEDED FOR ANY PURPOSE, NOTIFY LAB WITHIN 5 DAYS.  LOWEST DETECTABLE LIMITS FOR URINE DRUG SCREEN Drug Class                     Cutoff (ng/mL) Amphetamine and metabolites    1000 Barbiturate and metabolites    200 Benzodiazepine                 200 Tricyclics and metabolites     300 Opiates and metabolites        300 Cocaine and metabolites        300 THC                            50 Performed at [facility], [address] [place], [place] [address]   SARS Coronavirus 2 by RT PCR (hospital order, performed in [facility] hospital lab) *cepheid single result test* Anterior Nasal Swab     Status: None   Collection Time: 09/29/21  3:46 AM   Specimen: Anterior Nasal Swab  Result Value Ref Range   SARS Coronavirus 2 by RT PCR NEGATIVE NEGATIVE    Comment: (NOTE) SARS-CoV-2 target nucleic acids are NOT DETECTED.  The SARS-CoV-2 RNA is generally detectable in upper and lower respiratory specimens during the acute phase of infection. The lowest concentration of SARS-CoV-2 viral copies this assay can detect is 250 copies / mL.  A negative result does not preclude SARS-CoV-2 infection and should not be used as the sole basis for treatment or other patient management decisions.  A negative result may occur with improper specimen collection / handling, submission of specimen other than nasopharyngeal swab, presence of viral mutation(s) within the areas targeted by this assay, and inadequate number of viral copies (<250 copies / mL). A negative result must be combined with clinical observations, patient history, and epidemiological information.  Fact Sheet for Patients:   RoadLapTop.co.za  Fact Sheet for Healthcare Providers: http://kim-miller.com/  This test is not yet approved or  cleared by the [place] FDA and has been authorized for detection and/or diagnosis of SARS-CoV-2 by FDA under an Emergency Use Authorization (EUA).  This EUA will remain in effect (meaning this test can be used) for the duration of the COVID-19 declaration under Section 564(b)(1) of the Act, 21 U.S.C. section 360bbb-3(b)(1), unless the authorization is terminated or revoked sooner.  Performed at [facility], [address], [place], [place] [address]     Medications:  Current Facility-Administered Medications  Medication Dose Route Frequency Provider Last Rate Last Admin   gabapentin (NEURONTIN) capsule 100 mg  100 mg Oral TID Chales Abrahams, NP       hydrOXYzine (ATARAX) tablet 25 mg  25 mg Oral TID PRN Chales Abrahams, NP       mirtazapine (REMERON SOL-TAB) disintegrating tablet 15 mg  15 mg Oral QHS Chales Abrahams, NP       No current outpatient medications on file.    Musculoskeletal: Pt moves all extremities; Strength & Muscle Tone: within normal limits Gait & Station: normal Patient leans: N/A  Psychiatric Specialty Exam:  Presentation  General Appearance: Appropriate for Environment; Casual; Neat  Eye Contact:Good  Speech:Clear and Coherent  Speech  Volume:Normal  Handedness:Right   Mood and Affect  Mood:Anxious; Depressed  Affect:Appropriate; Congruent   Thought Process  Thought Processes:Coherent; Goal Directed  Descriptions of Associations:Circumstantial  Orientation:Full (Time, Place and Person)  Thought Content:Logical  History of Schizophrenia/Schizoaffective disorder:No  Duration of Psychotic Symptoms:No data recorded Hallucinations:Hallucinations: None (endorses during admission assessment but denies at the time of interview)  Ideas of Reference:No data recorded Suicidal Thoughts:Suicidal Thoughts: Yes, Active SI Active Intent and/or Plan: With Intent; With Plan  Homicidal Thoughts:Homicidal Thoughts: No   Sensorium  Memory:Immediate Good; Recent Good; Remote Good  Judgment:Poor  Insight:Fair   Executive Functions  Concentration:Fair  Attention Span:Fair  Recall:No data recorded Fund of Knowledge:Good  Language:Good   Psychomotor Activity  Psychomotor Activity:Psychomotor Activity: Normal   Assets  Assets:Communication Skills; Housing   Sleep  Sleep:Sleep: Fair Number of Hours of Sleep: 6    Physical Exam: Physical Exam Constitutional:      Appearance: Normal appearance.  Cardiovascular:     Rate and Rhythm: Normal rate.  Pulses: Normal pulses.  Pulmonary:     Effort: Pulmonary effort is normal.  Musculoskeletal:     Cervical back: Normal range of motion.  Neurological:     General: No focal deficit present.     Mental Status: He is alert and oriented to person, place, and time.  Psychiatric:        Attention and Perception: Attention and perception normal.        Mood and Affect: Mood is depressed.        Speech: Speech normal.        Behavior: Behavior is cooperative.        Thought Content: Thought content includes suicidal ideation. Thought content includes suicidal plan.        Cognition and Memory: Cognition and memory normal.        Judgment: Judgment is  impulsive.   Review of Systems  Constitutional: Negative.   HENT: Negative.    Eyes: Negative.   Respiratory: Negative.    Cardiovascular: Negative.   Gastrointestinal: Negative.   Genitourinary: Negative.   Musculoskeletal:  Positive for back pain (presenting reason).  Skin: Negative.   Endo/Heme/Allergies: Negative.   Psychiatric/Behavioral:  Positive for depression and suicidal ideas. The patient has insomnia.   Blood pressure 120/67, pulse 64, temperature 97.8 F (36.6 C), temperature source Oral, resp. rate 18, SpO2 95 %. There is no height or weight on file to calculate BMI.  Treatment Plan Summary: Patient with suicidal ideations and plan to overdose on pills, triggered by personal loss and many psychosocial stressors.  He has a hx of depression, and is not taking antidepressant medications. He has limited protective factors and no social support, which makes him an acutely high risk for self harm. Discussed the plan for inpatient admissions and starting medications for mood stability.  He is reluctant to take medications but does agree to this.  Spent significant time with patient explaining rationale for medications and reviewing goal and potential side effects.  Labs were reviewed, no medical contribution to stated symptoms seen.  He's already medically cleared.  UDS is positive for THC; CMP-AST is marginally low but within normal limits to start psychiatric medication. WBC without leucocytosis; H&H is RBC 4.09/Hemoglobin 12.4 but this low appears chronic since 01/2021 and has improved since then.  He will need an updated EKG prior to starting medications.   Due to his hesitancy to start/continue psychiatric medications, he would be a good candidate for prozac but his is deferred in lieu of his allergy to sertraline. Thus, will offer mirtazpine 15mg  mg po qhs which which can help wit sleep and mood.   Daily contact with patient to assess and evaluate symptoms and progress in treatment  and Medication management  Start Mirtazapine 15mg  po qhs for mood/sleep Hydroxyzine 25mg  po TID prn anxiety Gabapentin 100mg  po TID for mood/pain and withdrawal symptoms Nicotine patch for tobacco abuse  Disposition: Recommend psychiatric Inpatient admission when medically cleared.  This service was provided via telemedicine using a 2-way, interactive audio and video technology.  Names of all persons participating in this telemedicine service and their role in this encounter. Name: Felicie MornRashann Erekson Role: Paitent   Name: Ophelia ShoulderShnese Jeevan Kalla Role: PMHNP    Chales AbrahamsShnese E Janari Yamada, NP 09/29/2021 11:48 AM

## 2021-09-29 NOTE — Group Note (Signed)
[facility] LCSW Group Therapy Note   Group Date: 09/29/2021 Start Time: 1300 End Time: 1330   Type of Therapy/Topic:  Group Therapy:  Emotion Regulation  Participation Level:  Did Not Attend   Mood:  Description of Group:    The purpose of this group is to assist patients in learning to regulate negative emotions and experience positive emotions. Patients will be guided to discuss ways in which they have been vulnerable to their negative emotions. These vulnerabilities will be juxtaposed with experiences of positive emotions or situations, and patients challenged to use positive emotions to combat negative ones. Special emphasis will be placed on coping with negative emotions in conflict situations, and patients will process healthy conflict resolution skills.  Therapeutic Goals: Patient will identify two positive emotions or experiences to reflect on in order to balance out negative emotions:  Patient will label two or more emotions that they find the most difficult to experience:  Patient will be able to demonstrate positive conflict resolution skills through discussion or role plays:   Summary of Patient Progress:   Patient did not attend group despite encouraged participation.     Therapeutic Modalities:   Cognitive Behavioral Therapy Feelings Identification Dialectical Behavioral Therapy   Unique Searfoss W Janasha Barkalow, LCSWA 

## 2021-09-30 DIAGNOSIS — F1220 Cannabis dependence, uncomplicated: Secondary | ICD-10-CM

## 2021-09-30 DIAGNOSIS — F1290 Cannabis use, unspecified, uncomplicated: Secondary | ICD-10-CM

## 2021-09-30 MED ORDER — TRAZODONE HCL 50 MG PO TABS: 50.0000 mg | ORAL_TABLET | Freq: Every evening | ORAL | Status: DC | PRN

## 2021-09-30 NOTE — Progress Notes (Signed)
   09/30/21 0000  Psych Admission Type (Psych Patients Only)  Admission Status Voluntary  Psychosocial Assessment  Patient Complaints Depression;Hopelessness  Eye Contact Intense  Facial Expression Anxious  Affect Anxious  Speech Logical/coherent  Interaction Guarded  Motor Activity Shuffling  Appearance/Hygiene Body odor  Behavior Characteristics Cooperative;Appropriate to situation  Mood Depressed  Thought Process  Coherency WDL  Content WDL  Delusions WDL  Perception WDL  Hallucination None reported or observed  Judgment WDL  Confusion WDL  Danger to Self  Current suicidal ideation? Denies  Danger to Others  Danger to Others None reported or observed

## 2021-09-30 NOTE — BHH Counselor (Signed)
Adult Comprehensive Assessment  Patient ID: [name], male   DOB: 02-Feb-1971, 51 y.o.   MRN: [id number]  Information Source: Information source: Patient  Current Stressors:  Patient states their primary concerns and needs for treatment are:: States he "did not want to live anymore, let things build up and overthinking" has thoughts to ovredose on muscle relaxer but he only had three remaining. Patient states their goals for this hospitilization and ongoing recovery are:: Statesbeing on the unit has helped him slow down. Educational / Learning stressors: none reported Employment / Job issues: non reported Family Relationships: none reported Museum/gallery curator / Lack of resources (include bankruptcy): states he is unable to pay all of his bill with his current income. Housing / Lack of housing: none reported Physical health (include injuries & life threatening diseases): endorses chronic pain Social relationships: states all of his friend ask him for favors. Substance abuse: endorses marajuana use Bereavement / Loss: death of daughter to [place] in Sep 07, 2018, fiancee and father died int he past decade.  Living/Environment/Situation:  Living Arrangements: Alone Living conditions (as described by patient or guardian): States living conditions are WNL currently, though he is unsure if he will be able to keep his utilities on. Who else lives in the home?: Patient lives alone How long has patient lived in current situation?: 5 years What is atmosphere in current home: Comfortable  Family History:  Marital status: Single Are you sexually active?: Yes What is your sexual orientation?: Heterosexual Has your sexual activity been affected by drugs, alcohol, medication, or emotional stress?: none reported Does patient have children?: Yes How many children?: 4 How is patient's relationship with their children?: Per chart: deceased son, deceased daughter (died in 09/07/2018 from [place]); younger daughter  (wonderful relationship) - older daughter (not a good relationship because of his alcoholism)  Childhood History:  By whom was/is the patient raised?: Both parents Additional childhood history information: "I had a great childhood" Description of patient's relationship with caregiver when they were a child: States he "worshiped the ground my father walked on but I disappointed him." Desribes his relationship with his mother as "good" Patient's description of current relationship with people who raised him/her: both parents are deceased. How were you disciplined when you got in trouble as a child/adolescent?: WNL Does patient have siblings?: Yes Number of Siblings: 3 Description of patient's current relationship with siblings: 2 sisters, one of whom died recently.  Baby sister - great relationship, lives in [place], does not speak to her regularly.  34 brother Did patient suffer any verbal/emotional/physical/sexual abuse as a child?: No Did patient suffer from severe childhood neglect?: No Has patient ever been sexually abused/assaulted/raped as an adolescent or adult?: No Was the patient ever a victim of a crime or a disaster?: No Witnessed domestic violence?: No Has patient been affected by domestic violence as an adult?: No  Education:  Highest grade of school patient has completed: 10th grade Currently a student?: No Learning disability?: No  Employment/Work Situation:   Employment Situation: Unemployed (inconsistent contract work) What is the [employer] Time Patient has Held a Job?: periodic 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, cooking, cleaning Has Patient ever Been in the [employer]?: No  Financial Resources:   Financial resources: No income Does patient have a Programmer, applications or guardian?: No  Alcohol/Substance Abuse:   What has been your use of drugs/alcohol within the last 12 months?: daily cannabis use If attempted suicide, did  drugs/alcohol play a  role in this?: No Alcohol/Substance Abuse Treatment Hx: Attends AA/NA  Social Support System:   Patient's Community Support System: Poor Describe Community Support System: Lists his sister as supportive his wellbeing, though patient appears hesitant to mention anyone Type of faith/religion: Darrick Meigs How does patient's faith help to cope with current illness?: n/a  Leisure/Recreation:   Do You Have Hobbies?: No  Strengths/Needs:   Patient states these barriers may affect/interfere with their treatment: None reported Patient states these barriers may affect their return to the community: None reported Other important information patient would like considered in planning for their treatment: None reported  Discharge Plan:   Currently receiving community mental health services: No (Signed consent for CSW to make appropriate referrals) Does patient have access to transportation?: Yes Does patient have financial barriers related to discharge medications?: Yes (No insurance listed) Will patient be returning to same living situation after discharge?: Yes  Summary/Recommendations:   Summary and Recommendations (to be completed by the evaluator): 51 y/o male w/ dx of MDD recurrent severe from [employer]. w/ no listed insurance admitted due to suicidal ideaiton. During assessment patient states he "did not want to live anymore, let things build up and overthinking" has thoughts to ovredose on muscle relaxer but he only had three remaining. Patient shared his symptoms have been progressively worsening over a period of 2-3 months. Endorses daily cannabis use; been sober 8 years from alcohol. Not currently associated with any outpatient mental health services, though patient has signed consent for CSW to make appropriate referrals. Therapeutic recommendations include further crisis stabilization, medicaiton mangement, case management, and group therapy.  Durenda Hurt.  09/30/2021

## 2021-09-30 NOTE — Group Note (Signed)
Recreation Therapy Group Note   Group Topic:Animal Assisted Therapy   Group Date: 09/30/2021 Start Time: 1430 End Time: 1515 Facilitators: Caroll Rancher, LRT,CTRS Location: [address]   AAA/T Program Assumption of Risk Form signed by Patient/ or Parent Legal Guardian Yes  Patient is free of allergies or severe asthma Yes  Patient reports no fear of animals Yes  Patient reports no history of cruelty to animals Yes  Patient understands his/her participation is voluntary Yes  Patient washes hands before animal contact Yes  Patient washes hands after animal contact Yes   Affect/Mood: Appropriate   Participation Level: Engaged    Clinical Observations/Individualized Feedback:   Patient attended session and interacted appropriately with therapy dog and peers. Patient asked appropriate questions about therapy dog and his training. Patient shared stories about their pets at home with group.    Plan: Continue to engage patient in RT group sessions 2-3x/week.   Caroll Rancher, Antonietta Jewel 09/30/2021 3:35 PM

## 2021-09-30 NOTE — Progress Notes (Signed)
NUTRITION ASSESSMENT  Pt identified as at risk on the Malnutrition Screen Tool  INTERVENTION: 1. Supplements: Ensure Plus High Protein po BID, each supplement provides 350 kcal and 20 grams of protein.   NUTRITION DIAGNOSIS: Unintentional weight loss related to sub-optimal intake as evidenced by pt report.   Goal: Pt to meet >/= 90% of their estimated nutrition needs.  Monitor:  PO intake  Assessment:  Pt admitted for suicidal ideation. Pt reports good appetite. Per weight records, pt has lost 29 lbs since February 2022. Ensure supplements have been ordered.  Height: Ht Readings from Last 1 Encounters:  09/29/21 5\' 7"  (1.702 m)    Weight: Wt Readings from Last 1 Encounters:  09/29/21 55.3 kg    Weight Hx: Wt Readings from Last 10 Encounters:  09/29/21 55.3 kg  09/29/21 61.2 kg  03/10/20 63.5 kg  02/23/20 63.5 kg  02/06/20 63.5 kg  03/17/19 67.1 kg  02/18/19 65.8 kg  04/30/17 72.6 kg  10/08/16 68 kg  04/26/16 67.1 kg    BMI:  Body mass index is 19.11 kg/m. Pt meets criteria for normal based on current BMI.  Estimated Nutritional Needs: Kcal: 25-30 kcal/kg Protein: > 1 gram protein/kg Fluid: 1 ml/kcal  Diet Order:  Diet Order             Diet regular Room service appropriate? Yes; Fluid consistency: Thin  Diet effective now                  Pt is also offered choice of unit snacks mid-morning and mid-afternoon.  Pt is eating as desired.   Lab results and medications reviewed.   Clayton Bibles, MS, RD, LDN Inpatient Clinical Dietitian Contact information available via Amion

## 2021-09-30 NOTE — BHH Suicide Risk Assessment (Signed)
[facility] Admission Suicide Risk Assessment   Nursing information obtained from:  Patient Demographic factors:  Male, Unemployed, Living alone Current Mental Status:  NA Loss Factors:  NA Historical Factors:  NA Risk Reduction Factors:  Positive therapeutic relationship  Total Time spent with patient: 45 minutes Principal Problem: MDD (major depressive disorder), recurrent episode, severe ([place]) Diagnosis:  Principal Problem:   MDD (major depressive disorder), recurrent episode, severe ([place]) Active Problems:   Moderate cannabis use disorder (HCC)  Subjective Data: [name] is a 51 year old male with a past medical history of MDD and substance use disorder presenting for suicidal ideation without a plan from the ED following a work-related leg injury.    He reports bringing himself in for "many bad thoughts" when thinking about his daughter in addition to multiple social stressors including financial stressors and multiple recent deaths. He found that he no longer wanted to live. He stated that these thoughts were increasing in intensity, but when asked about a plan responded with "I Don't know." He later said if he would have owned a firearm or had extra medication for his chronic pain that he wouldn't be here, but he didn't and knew he needed help instead. He endorsed significant feelings of depression and anxiety with a lot of overthinking.   His daughter passed away from [place] in Aug 28, 2018 after living in a nursing home following a brain injury. He finds that he will "crack" or break down and start crying if he talks about her, and he became tearful while talking about her on interview and needed to lie down. Additionally, he was aggravated and frustrated on interview and continually got up and down out of bed saying that it was due to his pain. He was wincing and uncomfortable on interview - stating that he did not want to talk to anyone at the beginning of the conversation. He mentioned an  x-ray for his leg, however, upon chart review, he has since been medically cleared by the ED. He also continually apologized to me for venting and that he was not angry at me but "at the rest of the staff." I reiterated to him that I knew and he could share his concerns while trying to also confirm why he was being given the medications he was in regards to his SI.   He reports that his sleep has been disrupted due to tossing and turning at night. His main interest he could identify was cooking that he got from his mother, and he has not lost any interest in this activity. He feels that his energy and concentration have been good, but that his appetite has been less than normal. He denies having any social support, and he is originally from [place]. He lives alone, but states he wishes he could find a partner because he believes that "cuddling" could help him like cooking, and that he could use the extra support. He does endorse feelings of guilt that he did not spend more time in [place] visiting his daughter prior to her passing.    He has had at least 3 prior hospitalizations at [facility] 04/26/201304/26/14 and 08-28-18 per chart review) for MDD, substance use disorder, and AH. He had been seen in the ED for SI multiple times in the past. On interview, he reports that he can't remember any past med trials. In 28-Aug-2010 he was seen at [facility] where he was reporting a prior history of resolved psychosis. When asked about  AH, he states he is not experiencing AH now, but that he does frequently. The voices are always negative and encourage him to give up, although not clear if they are commanding in nature.   Originally, he repeatedly stated that he did not want to talk because he reported being in "excruciating pain" that made him unable to sit or lay comfortably. Once he started talking, he primarily talked about frustrations with being in the hospital. He continually mentioned not being able to wear whatever he wanted  and that he could not get anyone to wash his clothes for him. Upon seeing him later in the afternoon, he was much less outwardly agitated and in a new set of clothes. Patient reported being self conscious and that he did not like to feel like he "looked like a bum." He repeatedly mentions that he feels he cannot be clean. He mentioned that a staff member stated they cleaned his shower, but he was adamant they did not.    He initially said that he is rejecting medications as his questions about them were not being answered to his comfort level. He is sure that he is receiving meds with ibuprofen which he endorses being allergic to. 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 He reports being distrustful of the medication and some of the staff. He believes they will want him to leave soon as he "returns attitude when they give him attitude." He stated multiple times that he has lost friends to medications that had things they didn't need, and that he is not crazy and would not allow [place] to make him unable to function when he left here. He believes that he is being treated for depression off of an assumption and that he does not meet the criteria for depression. When his suicidal ideation was mentioned, he did not consider this to make him depressed. He stated that if he wanted to hurt or kill himself, he would've done it at home instead of bringing himself in "in the pouring rain." he was agreeable to trying medication and seeing an outside psychiatrist for f/u.   Patient denies all SI/HI/AVH at his point in time, but upon chart review, he endorsed auditory hallucinations in the ED and on admission. When asked about SI, he mentions nothing now because he is "talking to a beautiful woman" in regards to the medical student. The interview was ended and continued with the attending physician present.    Continued Clinical Symptoms:  Alcohol Use Disorder Identification Test Final Score (AUDIT): 0 The "Alcohol Use Disorders Identification Test", Guidelines for Use in Primary Care, Second Edition.  World Pharmacologist [facility]). Score between 0-7:  no or low risk or alcohol related problems. Score between 8-15:  moderate risk of alcohol related problems. Score between 16-19:  high risk of alcohol related problems. Score 20 or above:  warrants further diagnostic evaluation for alcohol dependence and treatment.   CLINICAL FACTORS:   Depression:   Anhedonia Impulsivity Alcohol/Substance Abuse/Dependencies   Musculoskeletal: Strength & Muscle Tone: within normal limits Gait & Station: normal Patient leans: N/A  Psychiatric Specialty Exam:  Presentation  General Appearance: Appropriate for Environment  Eye Contact:Good  Speech:Clear and Coherent; Normal Rate  Speech Volume:Normal  Handedness:Right   Mood and Affect  Mood:Angry; Irritable; Anxious; Depressed  Affect:Congruent; Depressed   Thought Process  Thought Processes:Goal  Directed  Descriptions of Associations:Intact  Orientation:Full (Time, Place and Person)  Thought Content:Paranoid Ideation  History of Schizophrenia/Schizoaffective disorder:No  Duration of Psychotic Symptoms:No data recorded Hallucinations:Hallucinations: None (endorses during admission assessment but denies at the time of interview)  Ideas of Reference:None  Suicidal Thoughts:Suicidal Thoughts: No SI Active Intent and/or Plan: With Intent; With Plan  Homicidal Thoughts:Homicidal Thoughts: No   Sensorium  Memory:Immediate Good; Recent Good; Remote Good  Judgment:Fair  Insight:Poor   Executive Functions  Concentration:Good  Attention Span:Good  [place] of Knowledge:Good  Language:Good   Psychomotor Activity  Psychomotor Activity:Psychomotor Activity: Normal   Assets  Assets:Communication Skills;  Desire for Improvement; Housing; Resilience   Sleep  Sleep:Sleep: Fair (slept "pretty good" last night) Number of Hours of Sleep: 6    Physical Exam: Physical Exam ROS Blood pressure (!) 124/97, pulse 86, temperature 98.3 F (36.8 C), temperature source Oral, resp. rate 18, height 5\' 7"  (1.702 m), weight 55.3 kg, SpO2 100 %. Body mass index is 19.11 kg/m.   COGNITIVE FEATURES THAT CONTRIBUTE TO RISK:  None    SUICIDE RISK:   Severe:  Frequent, intense, and enduring suicidal ideation, specific plan, no subjective intent, but some objective markers of intent (i.e., choice of lethal method), the method is accessible, some limited preparatory behavior, evidence of impaired self-control, severe dysphoria/symptomatology, multiple risk factors present, and few if any protective factors, particularly a lack of social support.  PLAN OF CARE: Safety and Monitoring:             --  Voluntary admission to inpatient psychiatric unit for safety, stabilization and treatment             -- Daily contact with patient to assess and evaluate symptoms and progress in treatment             -- Patient's case to be discussed in multi-disciplinary team meeting             -- Observation Level : q15 minute checks             -- Vital signs:  q12 hours             -- Precautions: suicide, elopement, and assault   2. Psychiatric Diagnoses and Treatment:  - continue gabapentin 100mg  TID - continue atarax 25mg  TID PRN - continue remeron 15mg  QHS for appetite, sleep, and depression - start trazodone 50mg  QHS PRN for sleep   --  The risks/benefits/side-effects/alternatives to this medication were discussed in detail with the patient and time was given for questions. The patient consents to medication trial.              -- Encouraged patient to participate in unit milieu and in scheduled group therapies              -- Short Term Goals: Ability to identify changes in lifestyle to reduce recurrence of  condition will improve, Ability to verbalize feelings will improve, Ability to disclose and discuss suicidal ideas, Ability to demonstrate self-control will improve, Ability to identify and develop effective coping behaviors will improve, Ability to maintain clinical measurements within normal limits will improve, Compliance with prescribed medications will improve, and Ability to identify triggers associated with substance abuse/mental health issues will improve             -- Long Term Goals: Improvement in symptoms so as ready for discharge                3.  Medical Issues Being Addressed:              Tobacco Use Disorder             -- Nicotine patch 14mg /24 hours ordered             -- Smoking cessation encouraged               Pain  Right Leg Injury             -- Robaxin 500mg  Q8H PRN   4. Discharge Planning:              -- Social work and case management to assist with discharge planning and identification of hospital follow-up needs prior to discharge             -- Estimated LOS: 5-7 days              -- Discharge Concerns: Need to establish a safety plan; Medication compliance and effectiveness             -- Discharge Goals: Return home with outpatient referrals for mental health follow-up including medication management/psychotherapy   I certify that inpatient services furnished can reasonably be expected to improve the patient's condition.   Druscilla Petsch Winfred Leeds, MD 09/30/2021, 5:38 PM

## 2021-09-30 NOTE — BHH Group Notes (Signed)
Adult Psychoeducational Group Note  Date:  09/30/2021 Time:  10:12 PM  Group Topic/Focus:  Wrap-Up Group:   The focus of this group is to help patients review their daily goal of treatment and discuss progress on daily workbooks.  Participation Level:  Active  Participation Quality:  Attentive  Affect:  Appropriate  Cognitive:  Alert  Insight: Appropriate  Engagement in Group:  Engaged  Modes of Intervention:  Discussion  Additional Comments:  Patient attended and participated in the Wrap-up group.  Jearl Klinefelter 09/30/2021, 10:12 PM

## 2021-09-30 NOTE — BHH Suicide Risk Assessment (Signed)
[facility] INPATIENT:  Family/Significant Other Suicide Prevention Education  Suicide Prevention Education:  Patient Refusal for Family/Significant Other Suicide Prevention Education: The patient [name] has refused to provide written consent for family/significant other to be provided Family/Significant Other Suicide Prevention Education during admission and/or prior to discharge.  Physician notified.  CSW completed SPE with patient. Discussed potential triggers leading to suicidal ideation in addition to coping skills one might use in order to delay and distract self from self harming behaviors. CSW encouraged patient to utilize emergency services if they felt unable to maintain their safety. SPE flyer provided to patient at this time.   Corky Crafts 09/30/2021, 3:52 PM

## 2021-09-30 NOTE — Progress Notes (Signed)
   09/30/21 0500  Sleep  Number of Hours 7

## 2021-09-30 NOTE — Plan of Care (Signed)
Nurse discussed coping skills with patient.  

## 2021-09-30 NOTE — H&P (Signed)
Psychiatric Admission Assessment Adult  Patient Identification: [name] MRN:  [id number] Date of Evaluation:  09/30/2021 Chief Complaint:  MDD (major depressive disorder), recurrent episode, severe (HCC) [F33.2] Principal Diagnosis: MDD (major depressive disorder), recurrent episode, severe (HCC) Diagnosis:  Principal Problem:   MDD (major depressive disorder), recurrent episode, severe (HCC)   History of Present Illness:   [name] is a 51 year old male with a past medical history of MDD and substance use disorder presenting for suicidal ideation without a plan from the ED following a work-related leg injury.   He reports bringing himself in for "many bad thoughts" when thinking about his daughter in addition to multiple social stressors including financial stressors and multiple recent deaths. He found that he no longer wanted to live. He stated that these thoughts were increasing in intensity, but when asked about a plan responded with "I Don't know." He later said if he would have owned a firearm or had extra medication for his chronic pain that he wouldn't be here, but he didn't and knew he needed help instead. He endorsed significant feelings of depression and anxiety with a lot of overthinking.  His daughter passed away from COVID in Sep 26, 2018 after living in a nursing home following a brain injury. He finds that he will "crack" or break down and start crying if he talks about her, and he became tearful while talking about her on interview and needed to lie down. Additionally, he was aggravated and frustrated on interview and continually got up and down out of bed saying that it was due to his pain. He was wincing and uncomfortable on interview - stating that he did not want to talk to anyone at the beginning of the conversation. He mentioned an x-ray for his leg, however, upon chart review, he has since been medically cleared by the ED. He also continually apologized to me for  venting and that he was not angry at me but "at the rest of the staff." I reiterated to him that I knew and he could share his concerns while trying to also confirm why he was being given the medications he was in regards to his SI.  He reports that his sleep has been disrupted due to tossing and turning at night. His main interest he could identify was cooking that he got from his mother, and he has not lost any interest in this activity. He feels that his energy and concentration have been good, but that his appetite has been less than normal. He denies having any social support, and he is originally from [place]. He lives alone, but states he wishes he could find a partner because he believes that "cuddling" could help him like cooking, and that he could use the extra support. He does endorse feelings of guilt that he did not spend more time in [place] visiting his daughter prior to her passing.   He has had at least 3 prior hospitalizations at [facility] 05-25-2013May 25, 2014 and 2018-09-26 per chart review) for MDD, substance use disorder, and AH. He had been seen in the ED for SI multiple times in the past. On interview, he reports that he can't remember any past med trials. In 09/26/10 he was seen at [facility] where he was reporting a prior history of resolved psychosis. When asked about AH, he states he is not experiencing AH now, but that he does frequently. The voices are always negative and encourage him to give up, although not  clear if they are commanding in nature.  Originally, he repeatedly stated that he did not want to talk because he reported being in "excruciating pain" that made him unable to sit or lay comfortably. Once he started talking, he primarily talked about frustrations with being in the hospital. He continually mentioned not being able to wear whatever he wanted and that he could not get anyone to wash his clothes for him. Upon seeing him later in the afternoon, he was much less outwardly agitated  and in a new set of clothes. Patient reported being May conscious and that he did not like to feel like he "looked like a bum." He repeatedly mentions that he feels he cannot be clean. He mentioned that a staff member stated they cleaned his shower, but he was adamant they did not.   He initially said that he is rejecting medications as his questions about them were not being answered to his comfort level. He is sure that he is receiving meds with ibuprofen which he endorses being allergic to. 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 He reports being distrustful of the medication and some of the staff. He believes they will want him to leave soon as he "returns attitude when they give him attitude." He stated multiple times that he has lost friends to medications that had things they didn't need, and that he is not crazy and would not allow [place] to make him unable to function when he left here. He believes that he is being treated for depression off of an assumption and that he does not meet the criteria for depression. When his suicidal ideation was mentioned, he did not consider this to make him depressed. He stated that if he wanted to hurt or kill himself, he would've done it at home instead of bringing himself in "in the pouring rain." Upon recommendation by the attending physician, Dr. Abbott Pao, he was agreeable to trying medication and seeing an outside psychiatrist for f/u.  Patient denies all SI/HI/AVH at his point in time, but upon chart review, he endorsed auditory hallucinations in the ED and on admission. When asked about SI, he mentions nothing now because he is "talking to a beautiful woman" in regards to the medical student. The interview was ended and continued with the attending physician present.   Associated Signs/Symptoms: Depression Symptoms:  depressed mood, insomnia, feelings  of worthlessness/guilt, hopelessness, suicidal thoughts without plan, anxiety, disturbed sleep, decreased appetite, Duration of Depression Symptoms: Greater than two weeks  (Hypo) Manic Symptoms:  Irritable Mood, Anxiety Symptoms:  Excessive Worry, Psychotic Symptoms:  Potential paranoia regarding medications PTSD Symptoms: Negative patient denied any trauma history Total Time spent with patient: 45 minutes  Past Psychiatric Hx: Previous Psych Diagnoses: MDD, Substance Use Disorder, Note from 2010 [facility] endorses MDD w/ psychotic features w/ discharged medications from emergency room including risperdol and ambien Prior inpatient treatment: [facility] 3 times (MDD x2, EtOH abuse x1), and multiple other ED visits dating back earlier than 2010  Current/prior outpatient treatment: None. Prior rehab hx: [facility] for EtOH abuse (8-8.5 years sober) Psychotherapy hx: None. History of suicide: None. History of homicide: None. Psychiatric medication history: Zyprexa, Ativan, Geodon, Atarax, Trazodone, Librium, Risperdal, Effexor, Ambien Psychiatric medication compliance history: Reports never taking psychiatric medication outside of the hospital. Current Psychiatrist: None. Current therapist: None.  Substance Abuse Hx:  Alcohol: Sober for 8-8.5 years Tobacco: 1/2 pack a day Illicit drugs: Marijuana (Confirmed on UDS) Rx drug abuse:None Reported Rehab hx: [facility] for EtOH  Past Medical History: Medical Diagnoses: Tobacco use disorder, substance use Home Rx: None reported. Prior Hosp: Multiple ED and Hospitalizations for MDD, SI, Injuries, Pain, AH, and substance use Prior Surgeries/Trauma: None Head trauma, LOC, concussions, seizures: Minor head injury in 2020 without LOC or nausea but + for headache, blurry vision, and mild dizziness. Allergies: Ibuprofen, Acetaminophen, Asprin, Pork-derived Products PCP: unknown  Family History: Medical: None reported. Psych: None reported. Psych  Rx: None reported. SA/HA: None reported. Substance use family hx: Not asked.   Social History: Childhood: unknown Abuse: None reported. Marital Status: Single Sexual orientation:  Children: 1 living, 1 deceased Employment: Previously worked at group home, Currently home improvement in [facility] Education: unknown Peer Group: Reports having "no support" Housing: Lives alone Finances: Worried due to losing phone with work contacts and child support Legal: No charges, only child Charity fundraiser: None  Is the patient at risk to May? No.  Has the patient been a risk to May in the past 6 months? No.  Has the patient been a risk to May within the distant past? No.  Is the patient a risk to others? No.  Has the patient been a risk to others in the past 6 months? No.  Has the patient been a risk to others within the distant past? No.    Alcohol Screening:  Patient refused Alcohol Screening Tool: Yes 1. How often do you have a drink containing alcohol?: Never 2. How many drinks containing alcohol do you have on a typical day when you are drinking?: 1 or 2 3. How often do you have six or more drinks on one occasion?: Never AUDIT-C Score: 0 4. How often during the last year have you found that you were not able to stop drinking once you had started?: Never 5. How often during the last year have you failed to do what was normally expected from you because of drinking?: Never 6. How often during the last year have you needed a first drink in the morning to get yourself going after a heavy drinking session?: Never 7. How often during the last year have you had a feeling of guilt of remorse after drinking?: Never 8. How often during the last year have you been unable to remember what happened the night before because you had been drinking?: Never 9. Have you or someone else been injured as a result of your drinking?: No 10. Has a relative or friend or a doctor or another health worker  been concerned about your drinking or suggested you cut down?: No Alcohol Use Disorder Identification Test Final Score (AUDIT): 0 Substance Abuse History in the last 12 months:  Yes.   Consequences of Substance Abuse: Negative Previous Psychotropic Medications: Yes  Psychological Evaluations: Yes  Past Medical History:  Past Medical History:  Diagnosis Date   Alcohol abuse    Anemia    Colitis    Severe major depression with psychotic features (HCC)    History reviewed. No pertinent surgical history. Family History:  Family History  Problem Relation Age of Onset   Hypertension Mother    Hypertension Father    Hypertension Other    Family Psychiatric  History: None reported Tobacco Screening:  Reports smoking 1/2 pack a day but wanting to stop. Social History:  Social History   Substance and Sexual Activity  Alcohol Use No   Comment: former  last use1 year ago     Social History   Substance and Sexual Activity  Drug Use Yes   Frequency: 1.0 times per week   Types: Marijuana   Comment: former    Additional Social History:                           Allergies:   Allergies  Allergen Reactions   Acetaminophen Nausea Only   Aspirin Hives and Nausea And Vomiting   Ibuprofen Hives and Nausea And Vomiting   Pork-Derived Products Other (See Comments)    Stomach pain    Sertraline Diarrhea and Other (See Comments)    Acute GI distress with diarrhea and pain.    Lab Results:  Results for orders placed or performed during the hospital encounter of 09/28/21 (from the past 48 hour(s))  Comprehensive metabolic panel     Status: Abnormal   Collection Time: 09/28/21  9:44 PM  Result Value Ref Range   Sodium 141 135 - 145 mmol/L   Potassium 3.7 3.5 - 5.1 mmol/L   Chloride 105 98 - 111 mmol/L   CO2 30 22 - 32 mmol/L   Glucose, Bld 115 (H) 70 - 99 mg/dL    Comment: Glucose reference range applies only to samples taken after fasting for at least 8 hours.   BUN 9 6  - 20 mg/dL   Creatinine, Ser 1.61 0.61 - 1.24 mg/dL   Calcium 9.7 8.9 - 09.6 mg/dL   Total Protein 6.9 6.5 - 8.1 g/dL   Albumin 4.1 3.5 - 5.0 g/dL   AST 19 15 - 41 U/L   ALT 11 0 - 44 U/L   Alkaline Phosphatase 36 (L) 38 - 126 U/L   Total Bilirubin 0.5 0.3 - 1.2 mg/dL   GFR, Estimated >04 >54 mL/min    Comment: (NOTE) Calculated using the CKD-EPI Creatinine Equation (2021)    Anion gap 6 5 - 15    Comment: Performed at [facility], [address], [place], [place] [address]  Ethanol     Status: None   Collection Time: 09/28/21  9:44 PM  Result Value Ref Range   Alcohol, Ethyl (B) <10 <10 mg/dL    Comment: (NOTE) Lowest detectable limit for serum alcohol is 10 mg/dL.  For medical purposes only. Performed at [facility], [address], [place], [place] [address]   Salicylate level     Status: Abnormal   Collection Time: 09/28/21  9:44 PM  Result Value Ref Range   Salicylate Lvl <7.0 (L) 7.0 - 30.0 mg/dL    Comment: Performed at [facility], [address] [place], [place] [address]  Acetaminophen level     Status: Abnormal   Collection Time: 09/28/21  9:44 PM  Result Value Ref Range   Acetaminophen (Tylenol), Serum <10 (L) 10 - 30 ug/mL    Comment: Performed at [facility], [address] [place], [place] [address]  cbc     Status: Abnormal   Collection Time: 09/28/21  9:44 PM  Result Value Ref Range   WBC 8.7 4.0 - 10.5 K/uL   RBC 4.09 (L) 4.22 - 5.81 MIL/uL   Hemoglobin 12.4 (L) 13.0 - 17.0 g/dL   HCT 57.8 46.9 - 62.9 %   MCV 97.1 80.0 - 100.0 fL   MCH 30.3 26.0 - 34.0 pg   MCHC 31.2  30.0 - 36.0 g/dL   RDW 12.9 62.111.5 - 30.815.5 %   Platelets 277 150 - 400 K/uL   nRBC 0.0 0.0 - 0.2 %    Comment: Performed at [facility], [address] [place], [place] [address]  Rapid urine drug screen (hospital performed)     Status: Abnormal   Collection Time: 09/28/21  9:44 PM  Result Value Ref Range   Opiates NO29.5E DETECTED NONE DETECTED    Cocaine NONE DETECTED NONE DETECTED   Benzodiazepines NONE DETECTED NONE DETECTED   Amphetamines NONE DETECTED NONE DETECTED   Tetrahydrocannabinol POSITIVE (A) NONE DETECTED   Barbiturates NONE DETECTED NONE DETECTED    Comment: (NOTE) DRUG SCREEN FOR MEDICAL PURPOSES ONLY.  IF CONFIRMATION IS NEEDED FOR ANY PURPOSE, NOTIFY LAB WITHIN 5 DAYS.  LOWEST DETECTABLE LIMITS FOR URINE DRUG SCREEN Drug Class                     Cutoff (ng/mL) Amphetamine and metabolites    1000 Barbiturate and metabolites    200 Benzodiazepine                 200 Tricyclics and metabolites     300 Opiates and metabolites        300 Cocaine and metabolites        300 THC                            50 Performed at [facility], [address] [place], [place] [address]   SARS Coronavirus 2 by RT PCR (hospital order, performed in [facility] hospital lab) *cepheid single result test* Anterior Nasal Swab     Status: None   Collection Time: 09/29/21  3:46 AM   Specimen: Anterior Nasal Swab  Result Value Ref Range   SARS Coronavirus 2 by RT PCR NEGATIVE NEGATIVE    Comment: (NOTE) SARS-CoV-2 target nucleic acids are NOT DETECTED.  The SARS-CoV-2 RNA is generally detectable in upper and lower respiratory specimens during the acute phase of infection. The lowest concentration of SARS-CoV-2 viral copies this assay can detect is 250 copies / mL. A negative result does not preclude SARS-CoV-2 infection and should not be used as the sole basis for treatment or other patient management decisions.  A negative result may occur with improper specimen collection / handling, submission of specimen other than nasopharyngeal swab, presence of viral mutation(s) within the areas targeted by this assay, and inadequate number of viral copies (<250 copies / mL). A negative result must be combined with clinical observations, patient history, and epidemiological information.  Fact Sheet for Patients:    RoadLapTop.co.zahttps://www.fda.gov/media/158405/download  Fact Sheet for Healthcare Providers: http://kim-miller.com/https://www.fda.gov/media/158404/download  This test is not yet approved or  cleared by the [place] FDA and has been authorized for detection and/or diagnosis of SARS-CoV-2 by FDA under an Emergency Use Authorization (EUA).  This EUA will remain in effect (meaning this test can be used) for the duration of the COVID-19 declaration under Section 564(b)(1) of the Act, 21 U.S.C. section 360bbb-3(b)(1), unless the authorization is terminated or revoked sooner.  Performed at [facility], [address] [place], [place] [address]     Blood Alcohol level:  Lab Results  Component Value Date   [facility] 09/28/2021   ETH <10 01/21/2021    Metabolic Disorder Labs:  Lab Results  Component Value Date   HGBA1C 5.2 08/27/2014  MPG 103 08/27/2014   MPG 105 03/08/2013   No results found for: PROLACTIN No results found for: CHOL, TRIG, HDL, CHOLHDL, VLDL, LDLCALC  Current Medications: Current Facility-Administered Medications  Medication Dose Route Frequency Provider Last Rate Last Admin   feeding supplement (ENSURE ENLIVE / ENSURE PLUS) liquid 237 mL  237 mL Oral BID BM Massengill, Nathan, MD   237 mL at 09/30/21 0914   gabapentin (NEURONTIN) capsule 100 mg  100 mg Oral TID Ophelia Shoulder E, NP   100 mg at 09/30/21 1140   hydrOXYzine (ATARAX) tablet 25 mg  25 mg Oral TID PRN Ophelia Shoulder E, NP   25 mg at 09/30/21 0923   methocarbamol (ROBAXIN) tablet 500 mg  500 mg Oral Q8H PRN Bartholomew Crews E, MD   500 mg at 09/30/21 1610   mirtazapine (REMERON SOL-TAB) disintegrating tablet 15 mg  15 mg Oral QHS Ophelia Shoulder E, NP   15 mg at 09/29/21 2154   nicotine (NICODERM CQ - dosed in mg/24 hours) patch 14 mg  14 mg Transdermal Daily Ophelia Shoulder E, NP   14 mg at 09/30/21 0900   PTA Medications: No medications prior to admission.    Musculoskeletal: Strength & Muscle Tone: within normal  limits Gait & Station:  limping due to pain Patient leans: N/A    Psychiatric Specialty Exam:  Presentation  General Appearance: Appropriate for Environment   Eye Contact:Good   Speech:Clear and Coherent; Normal Rate   Speech Volume:Normal   Handedness:Right   Mood and Affect  Mood:Angry; Irritable; Anxious; Depressed   Affect:Congruent; Depressed    Thought Process  Thought Processes:Goal Directed   Duration of Psychotic Symptoms: No data recorded Past Diagnosis of Schizophrenia or Psychoactive disorder: No  Descriptions of Associations:Intact   Orientation:Full (Time, Place and Person)   Thought Content:Paranoid Ideation   Hallucinations:Hallucinations: None (endorses during admission assessment but denies at the time of interview)   Ideas of Reference:None   Suicidal Thoughts:Suicidal Thoughts: No SI Active Intent and/or Plan: With Intent; With Plan   Homicidal Thoughts:Homicidal Thoughts: No    Sensorium  Memory:Immediate Good; Recent Good; Remote Good   Judgment:Fair   Insight:Poor    Executive Functions  Concentration:Good   Attention Span:Good   Recall:Good  Fund of Knowledge:Good   Language:Good    Psychomotor Activity  Psychomotor Activity:Psychomotor Activity: Normal    Assets  Assets:Communication Skills; Desire for Improvement; Housing; Resilience    Sleep  Sleep:Sleep: Fair (slept "pretty good" last night) Number of Hours of Sleep: 6     Physical Exam: Physical Exam Vitals and nursing note reviewed.  Constitutional:      Appearance: Normal appearance. He is normal weight.  Pulmonary:     Effort: Pulmonary effort is normal.  Neurological:     Mental Status: He is alert and oriented to person, place, and time.   Review of Systems  Constitutional:  Negative for malaise/fatigue.  Musculoskeletal:  Positive for myalgias.  Psychiatric/Behavioral:  Positive for depression and substance abuse.  Negative for hallucinations, memory loss and suicidal ideas. The patient is nervous/anxious.   Blood pressure 135/77, pulse 62, temperature 98.3 F (36.8 C), temperature source Oral, resp. rate 18, height  (1.702 m), weight 55.3 kg, SpO2 98 %. Body mass index is 19.11 kg/m.   ASSESSMENT: Principal Problem:   MDD (major depressive disorder), recurrent episode, severe (HCC)   [facility] day 1.   Treatment Plan Summary:  Daily contact with patient to assess and evaluate symptoms and progress  in treatment  ASSESSMENT:  Diagnoses / Active Problems: - MDD, recurrent episode, severe  PLAN: Safety d treatment  -- Daily contact with patient to assess and evaluate symptoms and progress in treatment  -- Patient's case to be discussed in multi-disciplinary team meeting  -- Observation Level : q15 minute checks  -- Vital signs:  q12 hours  -- Precautions: suicide, elopement, and assault  2. Psychiatric Diagnoses and Treatment:  - continue gabapentin 100mg  TID - continue atarax  TID PRN - continue remeron  QHS for appetite, sleep, and depression - start trazodone  QHS PRN for sleep  --  The risks/benefits/side-effects/alternatives to this medication were discussed in detail with the patient and time was given for questions. The patient consents to medication trial.   -- Encouraged patient to participate in unit milieu and in scheduled group therapies   -- Short Term Goals: Ability to identify changes in lifestyle to reduce recurrence of condition will improve, Ability to verbalize feelings will improve, Ability to disclose and discuss suicidal ideas, Ability to demonstrate May-control will improve, Ability to identify and develop effective coping behaviors will improve, Ability to maintain clinical measurements within normal limits will improve, Compliance with prescribed medications  will improve, and Ability to identify triggers associated with substance abuse/mental health issues will improve  -- Long Term Goals: Improvement in symptoms so as ready for discharge    3. Medical Issues Being Addressed:   Tobacco Use Disorder  -- Nicotine patch /24 hours ordered  -- Smoking cessation encouraged   Pain  Right Leg Injury  -- Robaxin  Q8H PRN  4. Discharge Planning:   -- Social work and case management to assist with discharge planning and identification of hospital follow-up needs prior to discharge  -- Estimated LOS: 5-7 days   -- Discharge Concerns: Need to establish a safety plan; Medication compliance and effectiveness  -- Discharge Goals: Return home with outpatient referrals for mental health follow-up including medication management/psychotherapy   Observation Level/Precautions:  15 minute checks  Laboratory:   TSH  Psychotherapy:    Medications:    Consultations:    Discharge Concerns:    Estimated LOS: 5-7 days  Other:    I certify that inpatient services furnished can reasonably be expected to improve the patient's condition.    Louanne Skye, Medical Student 5/30/20233:47 PM   Attestation for Student Documentation:   I personally was present and performed or re-performed the history, physical exam and medical decision-making activities of this service and have verified that the service and findings are accurately documented in the student's note.  I certify that I saw and interviewed the patient together with the medical student and was present for the duration of the interview.  I reviewed the medical record.  I performed or reperformed the mental status examination of the patient as indicated.  I formulated the assessment and plan of treatment as documented: patient reports worsening depression sec to multiple losses past few years, also reports AH worsening with voices telling him " negative things" denies commanding hallucinations to  harm May or others, admits to SI prior to this admission, vague plan " if I had more pain pills I would take it", denies si in the hospital, able to contract for safety, patient was noted by staff to be irritable and agitated at times but redirectable, at time of evaluation with this provider he was pleasant, calm, cooperative  and appropriately interactive.   Rillie Riffel Abbott Pao, MD

## 2021-09-30 NOTE — Progress Notes (Signed)
D:  Patient's self inventory sheet, patient has fair sleep, no sleep medicine.  Fair appetite, normal energy level, good concentration.  Rated depression 5, denied hopeless and anxiety.  Denied withdrawals.  Denied SI.  Pain, worst pain #10.  Goal is get well, Self preservation is number one.  Stay prayed up and keep focus on me.  No discharge plans. A:  Medications administered per MD orders.  Emotional support and encouragement given patient. R:  Denied SI and HI, contracts for safety.  Denied A/V hallucinations.  Safety maintained with 15 minute checks.

## 2021-10-01 ENCOUNTER — Encounter (HOSPITAL_COMMUNITY): Payer: Self-pay

## 2021-10-01 ENCOUNTER — Inpatient Hospital Stay (HOSPITAL_COMMUNITY)
Admit: 2021-10-01 | Discharge: 2021-10-01 | Disposition: A | Discharge status: Home / Self Care | Payer: Federal, State, Local not specified - Other | Attending: Emergency Medicine | Admitting: Emergency Medicine

## 2021-10-01 LAB — TSH: TSH: 1.715 u[IU]/mL (ref 0.350–4.500)

## 2021-10-01 LAB — HIV ANTIBODY (ROUTINE TESTING W REFLEX): HIV Screen 4th Generation wRfx: NONREACTIVE

## 2021-10-01 MED ORDER — GABAPENTIN 100 MG PO CAPS
200.0000 mg | ORAL_CAPSULE | Freq: Three times a day (TID) | ORAL | Status: DC
  Administered 2021-10-01 – 2021-10-02 (×2): 200 mg via ORAL
  Filled 2021-10-01 (×5): qty 2

## 2021-10-01 MED ORDER — GABAPENTIN 100 MG PO CAPS
100.0000 mg | ORAL_CAPSULE | Freq: Once | ORAL | Status: AC
  Administered 2021-10-01: 100 mg via ORAL
  Filled 2021-10-01: qty 1

## 2021-10-01 NOTE — [facility] Group Notes (Signed)
Pt did not attend PsychoEducational Group. Pt was off unit.

## 2021-10-01 NOTE — Progress Notes (Signed)
     10/01/21 2115  Psych Admission Type (Psych Patients Only)  Admission Status Voluntary  Psychosocial Assessment  Patient Complaints Anxiety;Depression  Eye Contact Intense  Facial Expression Anxious  Affect Anxious  Speech Logical/coherent  Interaction Assertive  Motor Activity Slow  Appearance/Hygiene Improved  Behavior Characteristics Cooperative  Mood Pleasant;Anxious  Thought Process  Coherency WDL  Content WDL  Delusions None reported or observed  Perception WDL  Hallucination None reported or observed  Judgment WDL  Confusion WDL  Danger to Self  Current suicidal ideation? Denies  Danger to Others  Danger to Others None reported or observed

## 2021-10-01 NOTE — Group Note (Signed)
[facility] LCSW Group Therapy Note  Date/Time: 10/04/14 at 3:00pm  Type of Therapy and Topic:  Group Therapy:  Strengths and Qualities  Participation Level:  Active  Description of Group:    In this group patients will be asked to explore and define the terms strength ans qualities.  Patients will be guided to discuss their thoughts, feelings, and behaviors as to where strengths and qualities originate. Participants will then list some of their strengths and qualities related to each subject topic. This group will be process-oriented, with patients participating in exploration of their own experiences as well as giving and receiving support and challenge from other group members.  Therapeutic Goals: Patient will identify specific strengths related to their personal life. Patient will identify feelings, thoughts, and beliefs about strengths and qualities. Patient will identify ways their strengths have been used. . Patient will identify situations where they have helped others or made someone else happy. .  Summary of Patient Progress Patient was present for the entirety of the group session. Patient was an active listener and participated in the topic of discussion, provided helpful advice to others, and added nuance to topic of conversation. Patient states his strengths are "prayer and spiritually" Further states the person he looks up to is Joe Peschi from [employer].      Therapeutic Modalities:   Cognitive Behavioral Therapy Solution Focused Therapy Motivational Interviewing Brief Therapy

## 2021-10-01 NOTE — Progress Notes (Addendum)
[facility] MD Progress Note  10/01/2021 3:12 PM [name]  MRN:  [id number] Subjective:   [name] is a 51 yr old male who presented on 5/28 to [facility] due to chronic low back pain and SI, he was admitted to [facility] on 5/30.  PPHx is significant for MDD, Recurrent, Severe, w/Psychosis and Substance Use Disorder and 3 prior hospitalizations (latest [facility] 03/2019) and has been to Rehab.   Case was discussed in the multidisciplinary team. MAR was reviewed and patient was compliant with medications.  He required PRN Robaxin.   Psychiatric Team made the following recommendations yesterday: -Continue Remeron 15 mg QHS -Start Gabapentin 100 mg TID    On interview today patient reports he slept ok last night, had good sleep but did toss and turn due to leg pain.  He reports no SI, HI, or AVH.  He reports no [place], Ideas of Reference, or other First Rank symptoms.  He reports no issues with his medications.  He reports that his main concern is his leg pain.  He reports that he has been working at [employer] and this happened after helping to lift a heavy glass sliding door.  He reports no injury at the time but the next morning feeling the pain.  Discussed increasing his Gabapentin which he was agreeable to.  Also discussed ordering an X-ray.    He reports that his psychiatric symptoms are better.  He reports he was hearing voices prior to being admitted to the hospital but that since being admitted he has none of these symptoms.  He reports his mood is improving.  He reports he is concerned about his weight loss.  He reports he usually is 135-140 lbs but on admission was around 120 lbs.  He reports he does have some concerns about  HIV and asks to be tested.  He reports no other concerns at present.   Principal Problem: MDD (major depressive disorder), recurrent episode, severe ([place]) Diagnosis: Principal Problem:   MDD (major depressive disorder), recurrent episode, severe ([place]) Active Problems:    Moderate cannabis use disorder (HCC)  Total Time spent with patient:  I personally spent 30 minutes on the unit in direct patient care. The direct patient care time included face-to-face time with the patient, reviewing the patient's chart, communicating with other professionals, and coordinating care. Greater than 50% of this time was spent in counseling or coordinating care with the patient regarding goals of hospitalization, psycho-education, and discharge planning needs.   Past Psychiatric History: MDD, Recurrent, Severe, w/Psychosis and Substance Use Disorder and 3 prior hospitalizations (latest [facility] 03/2019) and has been to Rehab.  Past Medical History:  Past Medical History:  Diagnosis Date   Alcohol abuse    Anemia    Colitis    Severe major depression with psychotic features ([place])    History reviewed. No pertinent surgical history. Family History:  Family History  Problem Relation Age of Onset   Hypertension Mother    Hypertension Father    Hypertension Other    Family Psychiatric  History: No Known Diagnosis', Substance Use, or Suicides.  Social History:  Social History   Substance and Sexual Activity  Alcohol Use No   Comment: former  last use1 year ago     Social History   Substance and Sexual Activity  Drug Use Yes   Frequency: 1.0 times per week   Types: Marijuana   Comment: former    Social History   Socioeconomic History   Marital status:  Single    Spouse name: Not on file   Number of children: Not on file   Years of education: Not on file   Highest education level: Not on file  Occupational History   Not on file  Tobacco Use   Smoking status: Every Day    Packs/day: 0.25    Types: Cigarettes   Smokeless tobacco: Never  Vaping Use   Vaping Use: Never used  Substance and Sexual Activity   Alcohol use: No    Comment: former  last use1 year ago   Drug use: Yes    Frequency: 1.0 times per week    Types: Marijuana    Comment: former   Sexual  activity: Yes    Birth control/protection: Condom  Other Topics Concern   Not on file  Social History Narrative   Not on file   Social Determinants of Health   Financial Resource Strain: Not on file  Food Insecurity: Not on file  Transportation Needs: Not on file  Physical Activity: Not on file  Stress: Not on file  Social Connections: Not on file   Additional Social History:                         Sleep: Good  Appetite:  Good  Current Medications: Current Facility-Administered Medications  Medication Dose Route Frequency Provider Last Rate Last Admin   feeding supplement (ENSURE ENLIVE / ENSURE PLUS) liquid 237 mL  237 mL Oral BID BM Massengill, Nathan, MD   237 mL at 10/01/21 1141   gabapentin (NEURONTIN) capsule 200 mg  200 mg Oral TID Briant Cedar, MD       hydrOXYzine (ATARAX) tablet 25 mg  25 mg Oral TID PRN Merlyn Lot E, NP   25 mg at 09/30/21 0923   methocarbamol (ROBAXIN) tablet 500 mg  500 mg Oral Q8H PRN Viann Fish E, MD   500 mg at 10/01/21 0743   mirtazapine (REMERON SOL-TAB) disintegrating tablet 15 mg  15 mg Oral QHS Merlyn Lot E, NP   15 mg at 09/30/21 2129   nicotine (NICODERM CQ - dosed in mg/24 hours) patch 14 mg  14 mg Transdermal Daily Merlyn Lot E, NP   14 mg at 10/01/21 0740   traZODone (DESYREL) tablet 50 mg  50 mg Oral QHS PRN Dian Situ, MD        Lab Results:  Results for orders placed or performed during the hospital encounter of 09/29/21 (from the past 48 hour(s))  TSH     Status: None   Collection Time: 10/01/21  6:40 AM  Result Value Ref Range   TSH 1.715 0.350 - 4.500 uIU/mL    Comment: Performed by a 3rd Generation assay with a functional sensitivity of <=0.01 uIU/mL. Performed at [facility], [place] [address], [place], [place] [address]     Blood Alcohol level:  Lab Results  Component Value Date   ETH <10 09/28/2021   ETH <10 64/40/3474    Metabolic Disorder Labs: Lab Results   Component Value Date   HGBA1C 5.2 08/27/2014   MPG 103 08/27/2014   MPG 105 03/08/2013   No results found for: PROLACTIN No results found for: CHOL, TRIG, HDL, CHOLHDL, VLDL, LDLCALC  Physical Findings: AIMS:  , ,  ,  ,    CIWA:  CIWA-Ar Total: 0 COWS:     Musculoskeletal: Strength & Muscle Tone: within normal limits Gait & Station: normal Patient leans:  N/A  Psychiatric Specialty Exam:  Presentation  General Appearance: Appropriate for Environment  Eye Contact:Good  Speech:Clear and Coherent; Normal Rate  Speech Volume:Normal  Handedness:Right   Mood and Affect  Mood:Dysphoric  Affect:Congruent   Thought Process  Thought Processes:Goal Directed  Descriptions of Associations:Intact  Orientation:Full (Time, Place and Person)  Thought Content:Rumination (about leg pain) No SI, HI, or AVH. No Paranoia, Ideas of Reference, or First Rank symptoms.  History of Schizophrenia/Schizoaffective disorder:No  Duration of Psychotic Symptoms:No data recorded Hallucinations:Hallucinations: None  Ideas of Reference:None  Suicidal Thoughts:Suicidal Thoughts: No  Homicidal Thoughts:Homicidal Thoughts: No   Sensorium  Memory:Immediate Good; Recent Good  Judgment:Fair  Insight:Poor   Executive Functions  Concentration:Good  Attention Span:Good  [place] of Knowledge:Good  Language:Good   Psychomotor Activity  Psychomotor Activity:Psychomotor Activity: Normal   Assets  Assets:Communication Skills; Desire for Improvement; Housing; Resilience   Sleep  Sleep:Sleep: Good Number of Hours of Sleep: 6.75    Physical Exam: Physical Exam Vitals and nursing note reviewed.  Constitutional:      General: He is not in acute distress.    Appearance: Normal appearance. He is normal weight. He is not ill-appearing or toxic-appearing.  HENT:     Head: Normocephalic and atraumatic.  Pulmonary:     Effort: Pulmonary effort is normal.   Musculoskeletal:        General: Normal range of motion.  Neurological:     General: No focal deficit present.     Mental Status: He is alert.   Review of Systems  Respiratory:  Negative for cough and shortness of breath.   Cardiovascular:  Negative for chest pain.  Gastrointestinal:  Negative for abdominal pain, constipation, diarrhea, nausea and vomiting.  Neurological:  Negative for dizziness, weakness and headaches.  Psychiatric/Behavioral:  Negative for depression, hallucinations and suicidal ideas. The patient is not nervous/anxious.   Blood pressure 114/87, pulse (!) 113, temperature 98 F (36.7 C), temperature source Oral, resp. rate 18, height _0  (1.702 m), weight 55.3 kg, SpO2 100 %. Body mass index is 19.11 kg/m.   Treatment Plan Summary: Daily contact with patient to assess and evaluate symptoms and progress in treatment and Medication management  Savion Henshaw is a 51 yr old male who presented on 5/28 to [facility] due to chronic low back pain and SI, he was admitted to [facility] on 5/30.  PPHx is significant for MDD, Recurrent, Severe, w/Psychosis and Substance Use Disorder and 3 prior hospitalizations (latest [facility] 03/2019) and has been to Rehab.   Arrick has tolerated starting Remeron and Gabapentin without issues.  As his leg is still giving him significant pain we will increase his Gabapentin.  He did have an X-ray which showed no acute findings.  An HIV has been ordered for this evening.  Though he reported hallucinations prior to admission there have been no reports of AVH while on the unit.  EKG has been ordered.   MDD, Recurrent, Severe: -Continue Remeron 15 mg QHS for depression, sleep, and appetite   R Leg Pain: -Increase Gabapentin to 200 mg TID -Continue Robaxin 500 mg q8 PRN   Nicotine Dependence: -Continue Nicotine Patch 14 mg daily   -Continue PRN's: Tylenol, Maalox, Atarax, Milk of Magnesia, Trazodone   Labs on Admission- CMP: WNL except Alk Phos:  36,  CBC: WNL except RBC: 4.09,  Hem: 12.4,  UDS: THC Positive,  EtOH/Acetaminophen/ Salicylate: WNL,  Resp Panel: Neg 5/31- TSH: 1.715,  R Leg X-ray: No acute osseous abnormality. Mild  degenerative changes at the right hip.   Briant Cedar, MD 10/01/2021, 3:12 PM

## 2021-10-01 NOTE — BH IP Treatment Plan (Signed)
Interdisciplinary Treatment and Diagnostic Plan Update  10/01/2021 Time of Session: 9:15am  Brogan Martis MRN: [id number]  Principal Diagnosis: MDD (major depressive disorder), recurrent episode, severe ([place])  Secondary Diagnoses: Principal Problem:   MDD (major depressive disorder), recurrent episode, severe ([place]) Active Problems:   Moderate cannabis use disorder (HCC)   Current Medications:  Current Facility-Administered Medications  Medication Dose Route Frequency Provider Last Rate Last Admin   feeding supplement (ENSURE ENLIVE / ENSURE PLUS) liquid 237 mL  237 mL Oral BID BM Massengill, Nathan, MD   237 mL at 09/30/21 1600   gabapentin (NEURONTIN) capsule 100 mg  100 mg Oral TID Merlyn Lot E, NP   100 mg at 10/01/21 0740   hydrOXYzine (ATARAX) tablet 25 mg  25 mg Oral TID PRN Merlyn Lot E, NP   25 mg at 09/30/21 0923   methocarbamol (ROBAXIN) tablet 500 mg  500 mg Oral Q8H PRN Viann Fish E, MD   500 mg at 10/01/21 0743   mirtazapine (REMERON SOL-TAB) disintegrating tablet 15 mg  15 mg Oral QHS Merlyn Lot E, NP   15 mg at 09/30/21 2129   nicotine (NICODERM CQ - dosed in mg/24 hours) patch 14 mg  14 mg Transdermal Daily Merlyn Lot E, NP   14 mg at 10/01/21 0740   traZODone (DESYREL) tablet 50 mg  50 mg Oral QHS PRN Dian Situ, MD       PTA Medications: No medications prior to admission.    Patient Stressors: Financial difficulties   Health problems   Loss of daughter    Occupational concerns    Patient Strengths: Average or above average intelligence  Communication skills   Treatment Modalities: Medication Management, Group therapy, Case management,  1 to 1 session with clinician, Psychoeducation, Recreational therapy.   Physician Treatment Plan for Primary Diagnosis: MDD (major depressive disorder), recurrent episode, severe ([place]) Long Term Goal(s): Improvement in symptoms so as ready for discharge   Short Term Goals: Ability to identify changes in  lifestyle to reduce recurrence of condition will improve Ability to verbalize feelings will improve Ability to disclose and discuss suicidal ideas Ability to demonstrate self-control will improve Ability to identify and develop effective coping behaviors will improve Ability to maintain clinical measurements within normal limits will improve Compliance with prescribed medications will improve Ability to identify triggers associated with substance abuse/mental health issues will improve  Medication Management: Evaluate patient's response, side effects, and tolerance of medication regimen.  Therapeutic Interventions: 1 to 1 sessions, Unit Group sessions and Medication administration.  Evaluation of Outcomes: Not Met  Physician Treatment Plan for Secondary Diagnosis: Principal Problem:   MDD (major depressive disorder), recurrent episode, severe ([place]) Active Problems:   Moderate cannabis use disorder (HCC)  Long Term Goal(s): Improvement in symptoms so as ready for discharge   Short Term Goals: Ability to identify changes in lifestyle to reduce recurrence of condition will improve Ability to verbalize feelings will improve Ability to disclose and discuss suicidal ideas Ability to demonstrate self-control will improve Ability to identify and develop effective coping behaviors will improve Ability to maintain clinical measurements within normal limits will improve Compliance with prescribed medications will improve Ability to identify triggers associated with substance abuse/mental health issues will improve     Medication Management: Evaluate patient's response, side effects, and tolerance of medication regimen.  Therapeutic Interventions: 1 to 1 sessions, Unit Group sessions and Medication administration.  Evaluation of Outcomes: Not Met   RN Treatment Plan for Primary Diagnosis: MDD (  major depressive disorder), recurrent episode, severe ([place]) Long Term Goal(s): Knowledge of  disease and therapeutic regimen to maintain health will improve  Short Term Goals: Ability to remain free from injury will improve, Ability to participate in decision making will improve, Ability to verbalize feelings will improve, Ability to disclose and discuss suicidal ideas, and Ability to identify and develop effective coping behaviors will improve  Medication Management: RN will administer medications as ordered by provider, will assess and evaluate patient's response and provide education to patient for prescribed medication. RN will report any adverse and/or side effects to prescribing provider.  Therapeutic Interventions: 1 on 1 counseling sessions, Psychoeducation, Medication administration, Evaluate responses to treatment, Monitor vital signs and CBGs as ordered, Perform/monitor CIWA, COWS, AIMS and Fall Risk screenings as ordered, Perform wound care treatments as ordered.  Evaluation of Outcomes: Not Met   LCSW Treatment Plan for Primary Diagnosis: MDD (major depressive disorder), recurrent episode, severe ([place]) Long Term Goal(s): Safe transition to appropriate next level of care at discharge, Engage patient in therapeutic group addressing interpersonal concerns.  Short Term Goals: Engage patient in aftercare planning with referrals and resources, Increase social support, Increase emotional regulation, Facilitate acceptance of mental health diagnosis and concerns, Identify triggers associated with mental health/substance abuse issues, and Increase skills for wellness and recovery  Therapeutic Interventions: Assess for all discharge needs, 1 to 1 time with Social worker, Explore available resources and support systems, Assess for adequacy in community support network, Educate family and significant other(s) on suicide prevention, Complete Psychosocial Assessment, Interpersonal group therapy.  Evaluation of Outcomes: Not Met   Progress in Treatment: Attending groups:  Yes. Participating in groups: Yes. Taking medication as prescribed: Yes. Toleration medication: Yes. Family/Significant other contact made: No, will contact:  [place]  Patient understands diagnosis: Yes. Discussing patient identified problems/goals with staff: Yes. Medical problems stabilized or resolved: Yes. Denies suicidal/homicidal ideation: Yes. Issues/concerns per patient self-inventory: No.   New problem(s) identified: No, Describe:  None   New Short Term/Long Term Goal(s): medication stabilization, elimination of SI thoughts, development of comprehensive mental wellness plan.   Patient Goals: "To get better"   Discharge Plan or Barriers: Patient recently admitted. CSW will continue to follow and assess for appropriate referrals and possible discharge planning.   Reason for Continuation of Hospitalization: Anxiety Depression Medication stabilization Suicidal ideation  Estimated Length of Stay: 3 to 7 days   Last [place] Suicide Severity Risk Score: [place] Admission (Current) from 09/29/2021 in [place] [facility] ED from 09/28/2021 in [place] ED from 06/06/2021 in [place] Moderate Risk Moderate Risk No Risk       Last PHQ 2/9 Scores:    09/29/2021    4:03 AM 09/03/2014    2:22 PM  Depression screen PHQ 2/9  Decreased Interest 2 0  Down, Depressed, Hopeless 3 0  PHQ - 2 Score 5 0  Altered sleeping 2   Tired, decreased energy 2   Change in appetite 2   Feeling bad or failure about yourself  3   Trouble concentrating 2   Moving slowly or fidgety/restless 1   Suicidal thoughts 2   PHQ-9 Score 19   Difficult doing work/chores Very difficult     Scribe for Treatment Team: Darleen Crocker, Latanya Presser 10/01/2021 10:58 AM

## 2021-10-01 NOTE — Plan of Care (Signed)
?  Problem: Education: ?Goal: Knowledge of the prescribed therapeutic regimen will improve ?Outcome: Progressing ?  ?Problem: Coping: ?Goal: Coping ability will improve ?Outcome: Progressing ?Goal: Will verbalize feelings ?Outcome: Progressing ?  ?

## 2021-10-01 NOTE — Progress Notes (Signed)
   10/01/21 0500  Psych Admission Type (Psych Patients Only)  Admission Status Voluntary  Psychosocial Assessment  Patient Complaints Depression  Eye Contact Intense  Facial Expression Anxious  Affect Anxious  Speech Logical/coherent  Interaction Guarded  Motor Activity Slow  Appearance/Hygiene Disheveled  Behavior Characteristics Cooperative;Appropriate to situation  Mood Depressed  Thought Process  Coherency WDL  Content WDL  Delusions WDL  Perception WDL  Hallucination None reported or observed  Judgment WDL  Confusion WDL  Danger to Self  Current suicidal ideation? Denies  Danger to Others  Danger to Others None reported or observed

## 2021-10-01 NOTE — Progress Notes (Signed)
Pt presents with pleasant mood, affect congruent. [place] states he feels frustrated regarding his physical health and leg pain , and feels that '' i'm not getting any help, if this is all they are going to do for me , i'd like to talk about signing the 72 hour for discharge because this is not working. '' Patient has been visible on the unit, pleasant and cooperative with peers. He has been compliant with medications, and although somewhat irritable regarding pain, is pleasant with Clinical research associate stating '' I don't mean it's your fault, you've been nice to me but I just have so much pain and I want to feel listened to .. ''' Informed treatment team of above and pt given robaxin with little improvement. Pt is observed in the dayroom, and does ambulate with a limp. He completed self inventory and rates his depression, hopelessness and anxiety all at a 0/10 , 10 being worst and 0 being none. Pt states his goal for the day is to '' go home and start my out patient '' Pt is safe, will con't to monitor.

## 2021-10-01 NOTE — Group Note (Signed)
Recreation Therapy Group Note   Group Topic:Team Building  Group Date: 10/01/2021 Start Time: 0930 End Time: 0956 Facilitators: Caroll Rancher, LRT,CTRS Location: [address] Day[address]   Goal Area(s) Addresses:  Patient will effectively work with peer towards shared goal.  Patient will identify skills used to make activity successful.  Patient will identify how skills used during activity can be applied to reach post d/c goals.   Group Description: [employer]. In teams of 3-4, patients were given 25 small craft pipe cleaners. Using the materials provided, patients were instructed to compete again the opposing team(s) to build the tallest free-standing structure from floor level. The activity was timed; difficulty increased by Clinical research associate as Production designer, theatre/television/film continued.  Systematically resources were removed with additional directions for example, placing one arm behind their back, working in silence, and shape stipulations. LRT facilitated post-activity discussion reviewing team processes and necessary communication skills involved in completion. Patients were encouraged to reflect how the skills utilized, or not utilized, in this activity can be incorporated to positively impact support systems post discharge.   Affect/Mood: N/A   Participation Level: Did not attend    Clinical Observations/Individualized Feedback:     Plan: Continue to engage patient in RT group sessions 2-3x/week.   Caroll Rancher, LRT,CTRS  10/01/2021 12:08 PM

## 2021-10-02 ENCOUNTER — Encounter (HOSPITAL_COMMUNITY): Payer: Self-pay | Admitting: Adult Health

## 2021-10-02 DIAGNOSIS — F332 Major depressive disorder, recurrent severe without psychotic features: Principal | ICD-10-CM

## 2021-10-02 MED ORDER — MIRTAZAPINE 15 MG PO TABS: 15.0000 mg | ORAL_TABLET | Freq: Every day | ORAL | 0 refills | Status: DC

## 2021-10-02 MED ORDER — GABAPENTIN 100 MG PO CAPS: 200.0000 mg | ORAL_CAPSULE | Freq: Three times a day (TID) | ORAL | 0 refills | Status: DC

## 2021-10-02 MED ORDER — NICOTINE 14 MG/24HR TD PT24: 14.0000 mg | MEDICATED_PATCH | Freq: Every day | TRANSDERMAL | 0 refills | Status: DC

## 2021-10-02 MED ORDER — MIRTAZAPINE 15 MG PO TABS
15.0000 mg | ORAL_TABLET | Freq: Every day | ORAL | Status: DC
  Filled 2021-10-02: qty 7

## 2021-10-02 NOTE — Discharge Summary (Addendum)
Physician Discharge Summary Note  Patient:  [name] is an 51 y.o., male MRN:  [id number] DOB:  [birth year] Patient phone:  [phone] (home)  Patient address:   [address] [place] [address],  Total Time spent with patient: 30 minutes  Date of Admission:  09/29/2021 Date of Discharge: 10/02/2021  Reason for Admission:    [name] is a 51 year old male with a past medical history of MDD and substance use disorder presenting for suicidal ideation without a plan from the ED following a work-related leg injury.    He reports bringing himself in for "many bad thoughts" when thinking about his daughter in addition to multiple social stressors including financial stressors and multiple recent deaths. He found that he no longer wanted to live. He stated that these thoughts were increasing in intensity, but when asked about a plan responded with "I Don't know." He later said if he would have owned a firearm or had extra medication for his chronic pain that he wouldn't be here, but he didn't and knew he needed help instead. He endorsed significant feelings of depression and anxiety with a lot of overthinking.   His daughter passed away from COVID in 02-Oct-2018 after living in a nursing home following a brain injury. He finds that he will "crack" or break down and start crying if he talks about her, and he became tearful while talking about her on interview and needed to lie down. Additionally, he was aggravated and frustrated on interview and continually got up and down out of bed saying that it was due to his pain. He was wincing and uncomfortable on interview - stating that he did not want to talk to anyone at the beginning of the conversation. He mentioned an x-ray for his leg, however, upon chart review, he has since been medically cleared by the ED. He also continually apologized to me for venting and that he was not angry at me but "at the rest of the staff." I reiterated to him  that I knew and he could share his concerns while trying to also confirm why he was being given the medications he was in regards to his SI.   He reports that his sleep has been disrupted due to tossing and turning at night. His main interest he could identify was cooking that he got from his mother, and he has not lost any interest in this activity. He feels that his energy and concentration have been good, but that his appetite has been less than normal. He denies having any social support, and he is originally from [place]. He lives alone, but states he wishes he could find a partner because he believes that "cuddling" could help him like cooking, and that he could use the extra support. He does endorse feelings of guilt that he did not spend more time in [place] visiting his daughter prior to her passing.   Principal Problem: MDD (major depressive disorder), recurrent episode, severe (HCC) Discharge Diagnoses: Principal Problem:   MDD (major depressive disorder), recurrent episode, severe (HCC) Active Problems:   Moderate cannabis use disorder [facility])   Past Psychiatric History: MDD, Recurrent, Severe, w/Psychosis and Substance Use Disorder and 3 prior hospitalizations (latest [facility] 03/2019) and has been to Rehab.  Past Medical History:  Past Medical History:  Diagnosis Date   Alcohol abuse    Anemia    Colitis    Severe major depression with psychotic features (HCC)  History reviewed. No pertinent surgical history. Family History:  Family History  Problem Relation Age of Onset   Hypertension Mother    Hypertension Father    Hypertension Other    Family Psychiatric  History:  No Known Diagnosis', Substance Use, or Suicides. Social History:  Social History   Substance and Sexual Activity  Alcohol Use No   Comment: former  last use1 year ago     Social History   Substance and Sexual Activity  Drug Use Yes   Frequency: 1.0 times per week   Types: Marijuana   Comment:  former    Social History   Socioeconomic History   Marital status: Single    Spouse name: Not on file   Number of children: Not on file   Years of education: Not on file   Highest education level: Not on file  Occupational History   Not on file  Tobacco Use   Smoking status: Every Day    Packs/day: 0.25    Types: Cigarettes   Smokeless tobacco: Never  Vaping Use   Vaping Use: Never used  Substance and Sexual Activity   Alcohol use: No    Comment: former  last use1 year ago   Drug use: Yes    Frequency: 1.0 times per week    Types: Marijuana    Comment: former   Sexual activity: Yes    Birth control/protection: Condom  Other Topics Concern   Not on file  Social History Narrative   Not on file   Social Determinants of Health   Financial Resource Strain: Not on file  Food Insecurity: Not on file  Transportation Needs: Not on file  Physical Activity: Not on file  Stress: Not on file  Social Connections: Not on file    Hospital Course:    During the patient's hospitalization, patient had extensive initial psychiatric evaluation, and follow-up psychiatric evaluations every day.  Psychiatric diagnoses provided upon initial assessment: MDD, Recurrent, Severe, w/out Psychosis  Patient's psychiatric medications were adjusted on admission: Started on Remeron and Gabapentin  During the hospitalization, other adjustments were made to the patient's psychiatric medication regimen: Gabapentin was titrated and he responded well to it.  Patient's care was discussed during the interdisciplinary team meeting every day during the hospitalization.  The patient is not having side effects to prescribed psychiatric medication.  Gradually, patient started adjusting to milieu. The patient was evaluated each day by a clinical provider to ascertain response to treatment. Improvement was noted by the patient's report of decreasing symptoms, improved sleep and appetite, affect, medication  tolerance, behavior, and participation in unit programming.  Patient was asked each day to complete a self inventory noting mood, mental status, pain, new symptoms, anxiety and concerns.   Symptoms were reported as significantly decreased or resolved completely by discharge.  The patient reports that their mood is stable.  The patient denied having suicidal thoughts for more than 48 hours prior to discharge.  Patient denies having homicidal thoughts.  Patient denies having auditory hallucinations.  Patient denies any visual hallucinations or other symptoms of psychosis.  The patient was motivated to continue taking medication with a goal of continued improvement in mental health.   The patient reports their target psychiatric symptoms of depression and SI responded well to the psychiatric medications, and the patient reports overall benefit other psychiatric hospitalization. Supportive psychotherapy was provided to the patient. The patient also participated in regular group therapy while hospitalized. Coping skills, problem solving as well as  relaxation therapies were also part of the unit programming.  Labs were reviewed with the patient, and abnormal results were discussed with the patient.  The patient is able to verbalize their individual safety plan to this provider.  # It is recommended to the patient to continue psychiatric medications as prescribed, after discharge from the hospital.    # It is recommended to the patient to follow up with your outpatient psychiatric provider and PCP.  # It was discussed with the patient, the impact of alcohol, drugs, tobacco have been there overall psychiatric and medical wellbeing, and total abstinence from substance use was recommended the patient.ed.  # Prescriptions provided or sent directly to preferred pharmacy at discharge. Patient agreeable to plan. Given opportunity to ask questions. Appears to feel comfortable with discharge.    # In the event  of worsening symptoms, the patient is instructed to call the crisis hotline, 911 and or go to the nearest ED for appropriate evaluation and treatment of symptoms. To follow-up with primary care provider for other medical issues, concerns and or health care needs  # Patient was discharged home with a plan to follow up as noted below.    On day of discharge he reports that he is feeling much better.  He reports he slept ok last night.  He reports that his appetite is improving.  He reports no SI, HI, or AVH.  He reports no [place], Ideas of Reference, or other First Rank symptoms.  He reports no issues with his medications.  He reports that the Gabapentin has been helpful with his leg pain.  He reports that yesterday he was able to walk around without pain but still has significant pain with changing positions.  Discussed with him that the X-ray of his leg showed no fractures.  Discussed with him that he will need to follow up with his PCP for further work up of his leg.  Discussed with him that his HIV test was negative and he was relieved to hear it.  Discussed with him what to do in the event of a future crisis.  Discussed that he can return to [facility], go to the [facility], go to the nearest ED, or call 911 or 988.   He reported understanding and had no concerns.    Physical Findings:  Musculoskeletal: Strength & Muscle Tone: within normal limits Gait & Station:  limp Patient leans: N/A   Psychiatric Specialty Exam:  Presentation  General Appearance: Appropriate for Environment; Casual  Eye Contact:Good  Speech:Clear and Coherent; Normal Rate  Speech Volume:Normal  Handedness:Right   Mood and Affect  Mood:Euthymic  Affect:Appropriate; Congruent   Thought Process  Thought Processes:Coherent; Goal Directed  Descriptions of Associations:Intact  Orientation:Full (Time, Place and Person)  Thought Content:Logical No SI, HI, or AVH. No Paranoia, Ideas of Reference, or First Rank  symptoms.  History of Schizophrenia/Schizoaffective disorder:No  Duration of Psychotic Symptoms:N/A  Hallucinations:Hallucinations: None  Ideas of Reference:None  Suicidal Thoughts:Suicidal Thoughts: No  Homicidal Thoughts:Homicidal Thoughts: No   Sensorium  Memory:Immediate Good; Recent Good  Judgment:Fair  Insight:Fair   Executive Functions  Concentration:Good  Attention Span:Good  Recall:Good  Fund of Knowledge:Good  Language:Good   Psychomotor Activity  Psychomotor Activity:Psychomotor Activity: Normal   Assets  Assets:Communication Skills; Desire for Improvement; Housing; Resilience   Sleep  Sleep:Sleep: Good Number of Hours of Sleep: 6    Physical Exam: Physical Exam Vitals and nursing note reviewed.  Constitutional:      General: He is not  in acute distress.    Appearance: Normal appearance. He is normal weight. He is not ill-appearing or toxic-appearing.  HENT:     Head: Normocephalic and atraumatic.  Pulmonary:     Effort: Pulmonary effort is normal.  Musculoskeletal:     Comments: Right leg pain and limp  Neurological:     General: No focal deficit present.     Mental Status: He is alert.   Review of Systems  Respiratory:  Negative for cough and shortness of breath.   Cardiovascular:  Negative for chest pain.  Gastrointestinal:  Negative for abdominal pain, constipation, diarrhea, nausea and vomiting.  Neurological:  Negative for dizziness, weakness and headaches.  Psychiatric/Behavioral:  Negative for depression, hallucinations and suicidal ideas. The patient is not nervous/anxious.   Blood pressure 96/75, pulse 99, temperature 98.5 F (36.9 C), temperature source Oral, resp. rate 16, height 5\' 7"  (1.702 m), weight 55.3 kg, SpO2 100 %. Body mass index is 19.11 kg/m.   Social History   Tobacco Use  Smoking Status Every Day   Packs/day: 0.25   Types: Cigarettes  Smokeless Tobacco Never   Tobacco Cessation:  A prescription  for an FDA-approved tobacco cessation medication provided at discharge   Blood Alcohol level:  Lab Results  Component Value Date   ETH <10 09/28/2021   ETH <10 01/21/2021    Metabolic Disorder Labs:  Lab Results  Component Value Date   HGBA1C 5.2 08/27/2014   MPG 103 08/27/2014   MPG 105 03/08/2013   No results found for: PROLACTIN No results found for: CHOL, TRIG, HDL, CHOLHDL, VLDL, LDLCALC  See Psychiatric Specialty Exam and Suicide Risk Assessment completed by Attending Physician prior to discharge.  Discharge destination:  Home  Is patient on multiple antipsychotic therapies at discharge:  No   Has Patient had three or more failed trials of antipsychotic monotherapy by history:  No  Recommended Plan for Multiple Antipsychotic Therapies: NA  Discharge Instructions     Diet - low sodium heart healthy   Complete by: As directed    Increase activity slowly   Complete by: As directed       Allergies as of 10/02/2021       Reactions   Acetaminophen Nausea Only   Aspirin Hives, Nausea And Vomiting   Ibuprofen Hives, Nausea And Vomiting   Pork-derived Products Other (See Comments)   Stomach pain    Sertraline Diarrhea, Other (See Comments)   Acute GI distress with diarrhea and pain.         Medication List     TAKE these medications      Indication  gabapentin 100 MG capsule Commonly known as: NEURONTIN Take 2 capsules (200 mg total) by mouth 3 (three) times daily.  Indication: Neuropathic Pain   mirtazapine 15 MG tablet Commonly known as: REMERON Take 1 tablet (15 mg total) by mouth at bedtime.  Indication: Major Depressive Disorder   nicotine 14 mg/24hr patch Commonly known as: NICODERM CQ - dosed in mg/24 hours Place 1 patch (14 mg total) onto the skin daily. Start taking on: October 03, 2021  Indication: Nicotine Addiction        Follow-up Information     [facility]. Go on 10/06/2021.   Specialty: Behavioral  Health Why: You have an appointment on  10/06/21  at 7:30 am to obtain therapy and medication management services.  Services are provided on a first come, first served basis.  This appointment will be held in  person. Contact information: [address] [place] [address] [phone]                Follow-up recommendations/Comments:    Activity: as tolerated   Diet: heart healthy   Other: -Follow-up with your outpatient psychiatric provider -instructions on appointment date, time, and address (location) are provided to you in discharge paperwork.   -Take your psychiatric medications as prescribed at discharge - instructions are provided to you in the discharge paperwork   -Follow-up with outpatient primary care doctor and other specialists -for management of chronic medical disease, including: Right Leg pain   -Testing: Follow-up with outpatient provider for abnormal lab results: None   -Recommend abstinence from alcohol, tobacco, and other illicit drug use at discharge.    -If your psychiatric symptoms recur, worsen, or if you have side effects to your psychiatric medications, call your outpatient psychiatric provider, 911, [phone] or go to the nearest emergency department.   -If suicidal thoughts recur, call your outpatient psychiatric provider, 911, 988 or go to the nearest emergency department.  Signed: Lauro Franklin, MD 10/02/2021, 8:58 AM

## 2021-10-02 NOTE — Progress Notes (Addendum)
  [facility] Adult Case Management Discharge Plan :  Will you be returning to the same living situation after discharge:  Yes,  Patient to return to place of residence, lives alone.  At discharge, do you have transportation home?: Yes,  CSW to provide cab voucher for transportation from the hospital.  Do you have the ability to pay for your medications: No. Patient has no listed insurance, to be provided with 7 day supply of medication at discharge. CSW has provided patient with clinic information for free/reduced medications.   Release of information consent forms completed and in the chart;  Patient's signature needed at discharge.  Patient to Follow up at:  [facility]. Go on 10/06/2021.   Specialty: Behavioral Health Why: You have an appointment on  10/06/21  at 7:30 am to obtain therapy and medication management services.  Services are provided on a first come, first served basis.  This appointment will be held in person. Contact information: [place] [phone]                Next level of care provider has access to [facility] and Suicide Prevention discussed: Yes,  SPE completed with patient, declined consent for CSW to reach family/friend.  CSW has mad multiple attempts to obtain consent to reach collateral in order to complete Suicide Education Prevention. Patient has continued to decline during entire admission. CSW completed SPE with patient. Discussed potential triggers leading to suicidal ideation in addition to coping skills one might use in order to delay and distract self from self harming behaviors. CSW encouraged patient to utilize emergency services if they felt unable to maintain their safety. SPE flyer provided to patient at this time.    Has patient been referred to the Quitline?: Yes, faxed on 10-02-2021 Tobacco Use: High Risk   Smoking Tobacco Use: Every  Day   Smokeless Tobacco Use: Never   Passive Exposure: Not on file   Patient has been referred for addiction treatment: Yes Endorses active substance use, nothing follows. Patient referred to [facility], see details above.  Social History   Substance and Sexual Activity  Drug Use Yes   Frequency: 1.0 times per week   Types: Marijuana   Comment: former   Social History   Substance and Sexual Activity  Alcohol Use No   Comment: former  last use 5+ year ago   Durenda Hurt, Latanya Presser 10/02/2021, 9:35 AM

## 2021-10-02 NOTE — Progress Notes (Addendum)
Discharge Note:  Patient discharged with cab voucher to home.  Suicide prevention information given and discussed with patient who said he understood and had no questions.  Patient denied SI and HI.  Denied A/V hallucinations.  Patient stated he received his belongings, clothing, toiletries, misc items, etc.  All required discharge information given patient.   Has been in hurry all morning to discharge.  Patient had talked to staff ugly.  Another nurse walked patient to admission room to get his belongings.  Security guard was with nurse during this discharge because of patient's behavior.  Patient walked out of [facility] building, got in taxi,  Then patient got out of taxi and walked back into building.   Then patient left again.

## 2021-10-02 NOTE — Progress Notes (Addendum)
D:  Patient denied SI and HI, contracts for safety.  Denied A/V hallucinations. A:  Scheduled medications given patient.  Emotional support and encouragement given patient. R:  Safety maintained with 15 minute checks.  Patient has been uncooperative and treated nursing ugly.  Patient limped back to bed this morning, stated he was having pain in his leg, back.  Nurse got her PRN's for patient's pain and anxiety, took medications to patient and he refused medications prns.  Stated nurse could not force him to take any medications.  Patient was ugly to MHT when he walked to washer/dryer room.  Patient was not limping or holding himself at that time.  Several staff members tried to get patient calm down while he was talking ugly about staff.

## 2021-10-02 NOTE — Plan of Care (Signed)
Nurse discussed coping skills with patient.  

## 2021-10-02 NOTE — [facility] Suicide Risk Assessment (Addendum)
Suicide Risk Assessment  Discharge Assessment    [facility] Discharge Suicide Risk Assessment   Principal Problem: MDD (major depressive disorder), recurrent episode, severe (HCC) Discharge Diagnoses: Principal Problem:   MDD (major depressive disorder), recurrent episode, severe (HCC) Active Problems:   Moderate cannabis use disorder (HCC)  During the patient's hospitalization, patient had extensive initial psychiatric evaluation, and follow-up psychiatric evaluations every day.  Psychiatric diagnoses provided upon initial assessment: MDD, Recurrent, Severe, w/out Psychosis  Patient's psychiatric medications were adjusted on admission: Started on Remeron and Gabapentin.  During the hospitalization, other adjustments were made to the patient's psychiatric medication regimen: Gabapentin was titrated and he responded well to it.  Gradually, patient started adjusting to milieu.   Patient's care was discussed during the interdisciplinary team meeting every day during the hospitalization.  The patient is not having side effects to prescribed psychiatric medication.  The patient reports their target psychiatric symptoms of depression and SI responded well to the psychiatric medications, and the patient reports overall benefit other psychiatric hospitalization. Supportive psychotherapy was provided to the patient. The patient also participated in regular group therapy while admitted.   Labs were reviewed with the patient, and abnormal results were discussed with the patient.  The patient denied having suicidal thoughts more than 48 hours prior to discharge.  Patient denies having homicidal thoughts.  Patient denies having auditory hallucinations.  Patient denies any visual hallucinations.  Patient denies having paranoid thoughts.  The patient is able to verbalize their individual safety plan to this provider.  It is recommended to the patient to continue psychiatric medications as prescribed,  after discharge from the hospital.    It is recommended to the patient to follow up with your outpatient psychiatric provider and PCP.  Discussed with the patient, the impact of alcohol, drugs, tobacco have been there overall psychiatric and medical wellbeing, and total abstinence from substance use was recommended the patient.  Total Time spent with patient: 30 minutes  Musculoskeletal: Strength & Muscle Tone: within normal limits Gait & Station:  limp Patient leans: N/A  Psychiatric Specialty Exam  Presentation  General Appearance: Appropriate for Environment; Casual  Eye Contact:Good  Speech:Clear and Coherent; Normal Rate  Speech Volume:Normal  Handedness:Right   Mood and Affect  Mood:Euthymic  Duration of Depression Symptoms: Greater than two weeks  Affect:Appropriate; Congruent   Thought Process  Thought Processes:Coherent; Goal Directed  Descriptions of Associations:Intact  Orientation:Full (Time, Place and Person)  Thought Content:Logical No SI, HI, or AVH. No Paranoia, Ideas of Reference, or First Rank symptoms.  History of Schizophrenia/Schizoaffective disorder:No  Duration of Psychotic Symptoms:N/A  Hallucinations:Hallucinations: None  Ideas of Reference:None  Suicidal Thoughts:Suicidal Thoughts: No  Homicidal Thoughts:Homicidal Thoughts: No   Sensorium  Memory:Immediate Good; Recent Good  Judgment:Fair  Insight:Fair   Executive Functions  Concentration:Good  Attention Span:Good  Recall:Good  Fund of Knowledge:Good  Language:Good   Psychomotor Activity  Psychomotor Activity:Psychomotor Activity: Normal   Assets  Assets:Communication Skills; Desire for Improvement; Housing; Resilience   Sleep  Sleep:Sleep: Good Number of Hours of Sleep: 6   Physical Exam: Physical Exam Vitals and nursing note reviewed.  Constitutional:      General: He is not in acute distress.    Appearance: Normal appearance. He is normal  weight. He is not ill-appearing or toxic-appearing.  HENT:     Head: Normocephalic and atraumatic.  Pulmonary:     Effort: Pulmonary effort is normal.  Musculoskeletal:     Comments: Right leg pain and limp  Neurological:  General: No focal deficit present.     Mental Status: He is alert.   Review of Systems  Respiratory:  Negative for cough and shortness of breath.   Cardiovascular:  Negative for chest pain.  Gastrointestinal:  Negative for abdominal pain, constipation, diarrhea, nausea and vomiting.  Neurological:  Negative for dizziness, weakness and headaches.  Psychiatric/Behavioral:  Negative for depression, hallucinations and suicidal ideas. The patient is not nervous/anxious.   Blood pressure 96/75, pulse 99, temperature 98.5 F (36.9 C), temperature source Oral, resp. rate 16, height 5\' 7"  (1.702 m), weight 55.3 kg, SpO2 100 %. Body mass index is 19.11 kg/m.  Mental Status Per Nursing Assessment::   On Admission:  NA  Demographic Factors:  Male, Living alone, and Unemployed  Loss Factors: NA  Historical Factors: NA  Risk Reduction Factors:   Positive therapeutic relationship  Continued Clinical Symptoms:  Chronic Pain Medical Diagnoses and Treatments/Surgeries  Cognitive Features That Contribute To Risk:  None    Suicide Risk:  Minimal: No identifiable suicidal ideation.  Patients presenting with no risk factors but with morbid ruminations; may be classified as minimal risk based on the severity of the depressive symptoms   Follow-up Information     [facility]. Go on 10/06/2021.   Specialty: Behavioral Health Why: You have an appointment on  10/06/21  at 7:30 am to obtain therapy and medication management services.  Services are provided on a first come, first served basis.  This appointment will be held in person. Contact information: [phone] [place] [phone]                Plan Of  Care/Follow-up recommendations:   Activity: as tolerated  Diet: heart healthy  Other: -Follow-up with your outpatient psychiatric provider -instructions on appointment date, time, and address (location) are provided to you in discharge paperwork.  -Take your psychiatric medications as prescribed at discharge - instructions are provided to you in the discharge paperwork  -Follow-up with outpatient primary care doctor and other specialists -for management of chronic medical disease, including: Right Leg pain  -Testing: Follow-up with outpatient provider for abnormal lab results: None  -Recommend abstinence from alcohol, tobacco, and other illicit drug use at discharge.   -If your psychiatric symptoms recur, worsen, or if you have side effects to your psychiatric medications, call your outpatient psychiatric provider, [phone] or go to the nearest emergency department.  -If suicidal thoughts recur, call your outpatient psychiatric provider, 911, [phone] or go to the nearest emergency department.   [phone], MD 10/02/2021, 8:41 AM

## 2021-10-14 ENCOUNTER — Encounter (HOSPITAL_COMMUNITY): Payer: Self-pay | Admitting: Psychiatry

## 2021-10-14 ENCOUNTER — Other Ambulatory Visit (HOSPITAL_BASED_OUTPATIENT_CLINIC_OR_DEPARTMENT_OTHER): Payer: Self-pay

## 2021-10-14 ENCOUNTER — Ambulatory Visit (INDEPENDENT_AMBULATORY_CARE_PROVIDER_SITE_OTHER): Payer: Federal, State, Local not specified - Other | Admitting: Psychiatry

## 2021-10-14 DIAGNOSIS — F17200 Nicotine dependence, unspecified, uncomplicated: Secondary | ICD-10-CM

## 2021-10-14 DIAGNOSIS — F32A Depression, unspecified: Secondary | ICD-10-CM

## 2021-10-14 DIAGNOSIS — G8929 Other chronic pain: Secondary | ICD-10-CM

## 2021-10-14 DIAGNOSIS — M25559 Pain in unspecified hip: Secondary | ICD-10-CM | POA: Insufficient documentation

## 2021-10-14 DIAGNOSIS — M546 Pain in thoracic spine: Secondary | ICD-10-CM

## 2021-10-14 MED ORDER — GABAPENTIN 300 MG PO CAPS
300.0000 mg | ORAL_CAPSULE | Freq: Three times a day (TID) | ORAL | 3 refills | Status: DC
  Filled 2021-10-14: qty 90, 30d supply, fill #0

## 2021-10-14 MED ORDER — NICOTINE 14 MG/24HR TD PT24
14.0000 mg | MEDICATED_PATCH | Freq: Every day | TRANSDERMAL | 3 refills | Status: DC
  Filled 2021-10-14: qty 28, 28d supply, fill #0

## 2021-10-14 NOTE — Progress Notes (Signed)
Psychiatric Initial Adult Assessment  Virtual Visit via Video Note  I connected with [name] on 10/14/21 at  8:00 AM EDT by a video enabled telemedicine application and verified that I am speaking with the correct person using two identifiers.  Location: Patient: Home Provider: Clinic   I discussed the limitations of evaluation and management by telemedicine and the availability of in person appointments. The patient expressed understanding and agreed to proceed.  I provided 30 minutes of non-face-to-face time during this encounter.   Patient Identification: [name] MRN:  [id number] Date of Evaluation:  10/14/2021 Referral Source: [facility] Chief Complaint:  "I feel a little better" Visit Diagnosis:    ICD-10-CM   1. Hip pain, unspecified laterality  M25.559 gabapentin (NEURONTIN) 300 MG capsule    Ambulatory referral to Internal Medicine    2. Chronic bilateral thoracic back pain  M54.6 gabapentin (NEURONTIN) 300 MG capsule   G89.29     3. Mild depression  F32.A gabapentin (NEURONTIN) 300 MG capsule    Ambulatory referral to Social Work    4. Tobacco dependence  F17.200 nicotine (NICODERM CQ - DOSED IN MG/24 HOURS) 14 mg/24hr patch      History of Present Illness: 51 year old male seen today for initial psychiatric evaluation.  He was referred to outpatient psychiatry by [facility] where he was seen on 09/29/2020 to 10/02/2021 for depression and suicidal ideation.  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  He is currently managed on gabapentin 200 mg three times daily, mirtazapine 15 mg nightly, and NicoDerm 14 mg patch.  He informed Clinical research associate that he never started mirtazapine and reports that his other medications are somewhat effective in managing his psychiatric condition.  Today he is well-groomed, pleasant, cooperative, engaged in conversation, and maintained eye contact.  He informed  Clinical research associate that since his hospitalization he feels a little better.  He notes that his depression and anxiety has somewhat improved.  Patient notes that he worries about her children who he has not spoken to in a while.  He also notes that he worries about his health.  Provider conducted a GAD-7 and patient scored a 9.  Provider also conducted PHQ-9 and patient scored a 14.  He notes that he did not start mirtazapine as his sleep is adequate.  Provider informed patient that mirtazapine is an antidepressant that also helps manage his anxiety and depression.  He endorsed understanding however notes that he does not want it.  Today he endorses passive SI but denies wanting to harm himself.  He denies SI/HI/VH.  Patient does note that at times she has auditory hallucinations but informed writer that he does not want to be medicated with that he is able to cope with them.  Patient informed writer that he has chronic back and hip pain.  He notes that gabapentin has not been effective in managing his pain.  Provider referred patient to community health and wellness for further evaluation.  Patient informed writer that the death of his daughter in 09/05/18 was traumatic.  He informed Clinical research associate that she died from COVID-19.  Patient notes that he has been sober from alcohol for 7 to 8 years.  He informed Clinical research associate that he feels like he wasted half of his life.  Today he is agreeable to increasing gabapentin 200 mg 3 times daily to 300 mg 3 times daily to help manage anxiety and mood.  At this time patient wishes to discontinue  mirtazapine.  He will continue the NicoDerm patch as prescribed.  Patient notes that he does not want to be medicated for his hallucinations.  Provider referred patient to community health and wellness for physical examination.  Provider also referred patient to counseling.  No other concerns noted at time.  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  Consequences of Substance Abuse: NA  Past Medical History:  Past Medical History:  Diagnosis Date   Alcohol abuse    Anemia    Colitis    Severe major depression with psychotic features (HCC)    History reviewed. No pertinent surgical history.  Family Psychiatric History: Unknown  Family History:  Family History  Problem Relation Age of Onset   Hypertension Mother    Hypertension Father    Hypertension Other     Social History:   Social History   Socioeconomic History   Marital status: Single    Spouse name: Not on file   Number of children: Not on file   Years of education: Not on file   Highest education level: Not on file  Occupational History   Not on file  Tobacco Use   Smoking status: Every Day    Packs/day: 0.25    Types: Cigarettes   Smokeless tobacco: Never  Vaping Use   Vaping Use: Never used  Substance and Sexual Activity   Alcohol use: No    Comment: former  last use 5+ year ago   Drug use: Yes    Frequency: 1.0 times per week    Types: Marijuana    Comment: former   Sexual activity: Yes    Birth control/protection: Condom  Other Topics Concern   Not on file  Social History Narrative   Not on file   Social Determinants of Health   Financial Resource Strain: High Risk (03/19/2019)   Overall Financial  Resource Strain (CARDIA)    Difficulty of Paying Living Expenses: Very hard  Food Insecurity: Food Insecurity Present (03/19/2019)   Hunger Vital Sign    Worried About Running Out of Food in the Last Year: Sometimes true    Ran Out of Food in the Last Year: Sometimes true  Transportation Needs: Not on file  Physical Activity: Not on file  Stress: Not on file  Social Connections: Not on file    Additional Social History: Patient resides in [place].  He is single and has 4 children (3 daughters 1 who is deceased and 1 son).  He endorses tobacco use.  He informed Clinical research associate that he has been sober from alcohol for over 7 years.  Allergies:   Allergies  Allergen Reactions   Acetaminophen Nausea Only   Aspirin Hives and Nausea And Vomiting   Ibuprofen Hives and Nausea And Vomiting   Pork-Derived Products Other (See Comments)    Stomach pain    Sertraline Diarrhea and Other (See Comments)    Acute GI distress with diarrhea and pain.     Metabolic Disorder Labs: Lab Results  Component Value  Date   HGBA1C 5.2 08/27/2014   MPG 103 08/27/2014   MPG 105 03/08/2013   No results found for: "PROLACTIN" No results found for: "CHOL", "TRIG", "HDL", "CHOLHDL", "VLDL", "LDLCALC" Lab Results  Component Value Date   TSH 1.715 10/01/2021    Therapeutic Level Labs: No results found for: "LITHIUM" No results found for: "CBMZ" No results found for: "VALPROATE"  Current Medications: Current Outpatient Medications  Medication Sig Dispense Refill   gabapentin (NEURONTIN) 300 MG capsule Take 1 capsule (300 mg total) by mouth 3 (three) times daily. 90 capsule 3   mirtazapine (REMERON) 15 MG tablet Take 1 tablet (15 mg total) by mouth at bedtime. 30 tablet 0   nicotine (NICODERM CQ - DOSED IN MG/24 HOURS) 14 mg/24hr patch Place 1 patch (14 mg total) onto the skin daily. 30 patch 3   No current facility-administered medications for this visit.    Musculoskeletal: Strength & Muscle Tone: within  normal limits and telehealth visit Gait & Station: normal Patient leans: N/A  Psychiatric Specialty Exam: Review of Systems  There were no vitals taken for this visit.There is no height or weight on file to calculate BMI.  General Appearance: Well Groomed  Eye Contact:  Good  Speech:  Clear and Coherent and Normal Rate  Volume:  Normal  Mood:  Anxious and Depressed  Affect:  Appropriate and Congruent  Thought Process:  Coherent, Goal Directed, and Linear  Orientation:  Full (Time, Place, and Person)  Thought Content:  Logical and Hallucinations: Auditory  Suicidal Thoughts:  Yes.  without intent/plan  Homicidal Thoughts:  No  Memory:  Immediate;   Good Recent;   Good Remote;   Good  Judgement:  Good  Insight:  Good  Psychomotor Activity:  Normal  Concentration:  Concentration: Good and Attention Span: Good  Recall:  Good  Fund of Knowledge:Good  Language: Good  Akathisia:  No  Handed:  Right  AIMS (if indicated):  not done  Assets:  Communication Skills Desire for Improvement Financial Resources/Insurance Housing Leisure Time Physical Health  ADL's:  Intact  Cognition: WNL  Sleep:  Good   Screenings: AIMS    Flowsheet Row [facility]  AIMS Total Score 0      AUDIT    Flowsheet Row [facility]) [facility] ([facility] Ad[facility] (Dis[facility] Ad[facility] ([facility]  Alcohol Use Disorder Identification Test Final Score (AUDIT) 0 0 0 34      GAD-7    Flowsheet Row Office Visit from 10/14/2021 in [facility]  Total GAD-7 Score 9      PHQ2-9    Flowsheet Row Office Visit from 10/14/2021 in [facility] ED from 09/28/2021 in [facility] EMERGENCY DEPARTMENT Office Visit from 09/03/2014 in [facility]  PHQ-2 Total Score 4 5 0  PHQ-9 Total Score 14 19 --      Flowsheet Row Office Visit from 10/14/2021 in [facility] Admission (Discharged) [facility] from [facility]/2021 [facility]  C-SSRS RISK CATEGORY Error: Q7 should not be populated when Q6 is No Moderate Risk Moderate Risk  Assessment and Plan: Patient endorses symptoms of anxiety, depression, and hallucinations. Today he is agreeable to increasing gabapentin 200 mg 3 times daily to 300 mg 3 times daily to help manage anxiety and mood.  At this time patient wishes to discontinue mirtazapine.  He will continue the NicoDerm patch as prescribed.  Patient notes that he does not want to be medicated for his hallucinations.   1. Hip pain, unspecified laterality  Increased-- gabapentin (NEURONTIN) 300 MG capsule; Take 1 capsule (300 mg total) by mouth 3 (three) times daily.  Dispense: 90 capsule; Refill: 3 - Ambulatory referral to Internal Medicine  2. Chronic bilateral thoracic back pain  Increased- gabapentin (NEURONTIN) 300 MG capsule; Take 1 capsule (300 mg total) by mouth 3 (three) times daily.  Dispense: 90 capsule; Refill: 3  3. Mild depression  Increased-- gabapentin (NEURONTIN) 300 MG capsule; Take 1 capsule (300 mg total) by mouth 3 (three) times daily.  Dispense: 90 capsule; Refill: 3 - Ambulatory referral to Social Work  4. Tobacco dependence  Continue- nicotine (NICODERM CQ - DOSED IN MG/24 HOURS) 14 mg/24hr patch; Place 1 patch (14 mg total) onto the skin daily.  Dispense: 28 patch; Refill: 3   Collaboration of Care: Other provider involved in patient's care AEB counselor  Patient/Guardian was advised Release of Information  must be obtained prior to any record release in order to collaborate their care with an outside provider. Patient/Guardian was advised if they have not already done so to contact the registration department to sign all necessary forms in order for us to release information regarding their care.   Consent: Patient/Guardian gives verbal consent for treatment and assignment of benefits for services provided during this visit. Patient/Guardian expressed understanding and agreed to proceed.   Shanna CiscoBrittney E Maynor Mwangi, NP 6/13/20238:57 AM

## 2021-10-28 ENCOUNTER — Other Ambulatory Visit (HOSPITAL_BASED_OUTPATIENT_CLINIC_OR_DEPARTMENT_OTHER): Payer: Self-pay

## 2022-01-14 ENCOUNTER — Encounter (HOSPITAL_COMMUNITY): Payer: Federal, State, Local not specified - Other | Admitting: Psychiatry

## 2022-03-05 ENCOUNTER — Ambulatory Visit (INDEPENDENT_AMBULATORY_CARE_PROVIDER_SITE_OTHER): Payer: Commercial Managed Care - HMO | Admitting: Podiatry

## 2022-03-05 DIAGNOSIS — B351 Tinea unguium: Secondary | ICD-10-CM | POA: Diagnosis not present

## 2022-03-05 DIAGNOSIS — M79609 Pain in unspecified limb: Secondary | ICD-10-CM

## 2022-03-05 NOTE — Progress Notes (Signed)
  Subjective:  Patient ID: [name], male    DOB: 25-Apr-1971,  MRN: [id number]  Chief Complaint  Patient presents with   Nail Problem    possible nail removal due to toenail fungus    51 y.o. male presents with the above complaint. History confirmed with patient. Patient presenting with pain related to dystrophic thickened elongated nails. Patient is unable to trim own nails related to nail dystrophy and/or mobility issues. Patient does not have a history of T2DM.  He has previously been seen for nail trim in the past.  He was previously treated for severe nail fungal infection with fluconazole by Dr. March Rummage.  Clearly this was not fully effective.  The patient does not know when he will have last had his nails trimmed but is clearly been a while based on the severe length and resultant incurvation of the toenails.  Objective:  Physical Exam: warm, good capillary refill nail exam severe fungal thickening and dystrophy of all nails present on both feet worse at the hallux nails.  These nails are painful with palpation.  The nails are extremely long and growing dystrophic with a to the point where they are curving down into the distal tip of the toe with subungual debris present.  Nails are brown to black in coloration. DP pulses palpable, PT pulses palpable, and protective sensation intact Left Foot:  Pain with palpation of nails due to elongation and dystrophic growth.  Right Foot: Pain with palpation of nails due to elongation and dystrophic growth.   Assessment:   1. Pain due to onychomycosis of nail   2. Onychomycosis      Plan:  Patient was evaluated and treated and all questions answered.  #Onychomycosis with pain  -Nails palliatively debrided as below. -Educated on self-care -I discussed with the patient that if the hallux nails continue to be painful we can consider removal of the hallux nails in the future however I would likely recommend placing a chemical including  phenol to prevent the regrowth as I do believe that they will grow back a fungal infection based on the severe fungal infection patient has normal nails.  Procedure: Nail Debridement Rationale: Pain Type of Debridement: manual, sharp debridement. Instrumentation: Nail nipper, rotary burr. Number of Nails: 10  Return in about 3 months (around 06/05/2022) for RFC.         Everitt Amber, DPM [facility]

## 2022-06-11 ENCOUNTER — Ambulatory Visit (INDEPENDENT_AMBULATORY_CARE_PROVIDER_SITE_OTHER): Payer: Commercial Managed Care - HMO | Admitting: Podiatry

## 2022-06-11 DIAGNOSIS — Z91199 Patient's noncompliance with other medical treatment and regimen due to unspecified reason: Secondary | ICD-10-CM

## 2022-06-11 NOTE — Progress Notes (Signed)
Pt was a no show for apt, charge generated 

## 2022-07-09 ENCOUNTER — Ambulatory Visit (INDEPENDENT_AMBULATORY_CARE_PROVIDER_SITE_OTHER): Payer: Commercial Managed Care - HMO | Admitting: Podiatry

## 2022-07-09 DIAGNOSIS — Z91199 Patient's noncompliance with other medical treatment and regimen due to unspecified reason: Secondary | ICD-10-CM

## 2022-07-09 NOTE — Progress Notes (Signed)
Pt was a no show for apt, no charge 

## 2022-07-18 ENCOUNTER — Encounter (HOSPITAL_COMMUNITY): Payer: Self-pay

## 2022-07-18 ENCOUNTER — Other Ambulatory Visit (HOSPITAL_BASED_OUTPATIENT_CLINIC_OR_DEPARTMENT_OTHER): Payer: Self-pay

## 2022-07-18 ENCOUNTER — Telehealth (HOSPITAL_COMMUNITY): Payer: Self-pay

## 2022-07-18 ENCOUNTER — Ambulatory Visit (HOSPITAL_COMMUNITY)
Admission: EM | Admit: 2022-07-18 | Discharge: 2022-07-18 | Disposition: A | Discharge status: Home / Self Care | Payer: Medicaid Other | Attending: Internal Medicine | Admitting: Internal Medicine

## 2022-07-18 DIAGNOSIS — M778 Other enthesopathies, not elsewhere classified: Secondary | ICD-10-CM | POA: Diagnosis not present

## 2022-07-18 MED ORDER — PREDNISONE 20 MG PO TABS
40.0000 mg | ORAL_TABLET | Freq: Every day | ORAL | 0 refills | Status: DC
  Filled 2022-07-18: qty 10, 5d supply, fill #0

## 2022-07-18 MED ORDER — PREDNISONE 20 MG PO TABS: 40.0000 mg | ORAL_TABLET | Freq: Every day | ORAL | 0 refills | Status: AC

## 2022-07-18 NOTE — ED Provider Notes (Signed)
[name]    CSN: [id number] Arrival date & time: 07/18/22  1228      History   Chief Complaint Chief Complaint  Patient presents with   Elbow Pain    HPI [name] is a 52 y.o. male.   Patient presents to urgent care for evaluation of right-sided elbow pain and inflammation that started a few days ago while he was in prison.  Patient was then present for 5 days recently where he was "kept in his cell for 23 hours out of the day" and he performed multiple push-ups and tricep dips to be able to pass the time.  He is now complaining of pain with movement to the right elbow.  No recent falls, trauma, or injuries to the right elbow to his knowledge.  History of polysubstance abuse and alcohol use disorder.  Denies use of over-the-counter medications to help with pain before coming to urgent care.  No numbness or tingling distal to injury.  Denies chest pain, shortness of breath, fever/chills, redness and swelling to the right elbow, and laceration/abrasion.  No bruising.  No recent antibiotic or steroid use reported.     Past Medical History:  Diagnosis Date   Alcohol abuse    Anemia    Colitis    Severe major depression with psychotic features [facility])     Patient Active Problem List   Diagnosis Date Noted   Hip pain 10/14/2021   Moderate cannabis use disorder ([place]) 09/30/2021   MDD (major depressive disorder), recurrent episode, severe ([place]) 03/17/2019   Essential hypertension 09/03/2014   Tobacco use disorder 09/03/2014   Substance abuse ([place])    Mild depression    Cellulitis of nasal tip    Chronic back pain    Cellulitis of nose 08/26/2014   Cellulitis 08/26/2014   MDD (major depressive disorder), recurrent severe, without psychosis ([place]) 06/09/2014   Major depressive disorder, recurrent, 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 No pertinent  surgical history.     Home Medications    Prior to Admission medications   Medication Sig Start Date End Date Taking? Authorizing Provider  predniSONE (DELTASONE) 20 MG tablet Take 2 tablets (40 mg total) by mouth daily for 5 days. 07/18/22 07/23/22 Yes StanhopeStasia Cavalier, FNP  gabapentin (NEURONTIN) 300 MG capsule Take 1 capsule (300 mg total) by mouth 3 (three) times daily. 10/14/21   Salley Slaughter, NP  mirtazapine (REMERON) 15 MG tablet Take 1 tablet (15 mg total) by mouth at bedtime. 10/02/21 11/01/21  Briant Cedar, MD  nicotine (NICODERM CQ - DOSED IN MG/24 HOURS) 14 mg/24hr patch Place 1 patch (14 mg total) onto the skin daily. 10/14/21   Salley Slaughter, NP    Family History Family History  Problem Relation Age of Onset   Hypertension Mother    Hypertension Father    Hypertension Other     Social History Social History   Tobacco Use   Smoking status: Every Day    Packs/day: .25    Types: Cigarettes   Smokeless tobacco: Never  Vaping Use   Vaping Use: Never used  Substance Use Topics   Alcohol use: No    Comment: former  last use 5+ year ago   Drug use: Yes    Frequency: 1.0 times per week    Types: Marijuana    Comment: former     Allergies  Acetaminophen, Aspirin, Ibuprofen, Pork-derived products, and Sertraline   Review of Systems Review of Systems Per HPI  Physical Exam Triage Vital Signs ED Triage Vitals  Enc Vitals Group     BP 07/18/22 1256 118/79     Pulse Rate 07/18/22 1256 72     Resp 07/18/22 1256 16     Temp 07/18/22 1256 98.6 F (37 C)     Temp Source 07/18/22 1256 Oral     SpO2 07/18/22 1256 95 %     Weight 07/18/22 1255 130 lb (59 kg)     Height 07/18/22 1255 5\' 6"  (1.676 m)     Head Circumference --      Peak Flow --      Pain Score 07/18/22 1255 10     Pain Loc --      Pain Edu? --      Excl. in [place]? --    No data found.  Updated Vital Signs BP 118/79 (BP Location: Left Arm)   Pulse 72   Temp 98.6 F (37  C) (Oral)   Resp 16   Ht 5\' 6"  (1.676 m)   Wt 130 lb (59 kg)   SpO2 95%   BMI 20.98 kg/m   Visual Acuity Right Eye Distance:   Left Eye Distance:   Bilateral Distance:    Right Eye Near:   Left Eye Near:    Bilateral Near:     Physical Exam Vitals and nursing note reviewed.  Constitutional:      Appearance: He is not ill-appearing or toxic-appearing.  HENT:     Head: Normocephalic and atraumatic.     Right Ear: Hearing and external ear normal.     Left Ear: Hearing and external ear normal.     Nose: Nose normal.     Mouth/Throat:     Lips: Pink.  Eyes:     General: Lids are normal. Vision grossly intact. Gaze aligned appropriately.     Extraocular Movements: Extraocular movements intact.     Conjunctiva/sclera: Conjunctivae normal.  Pulmonary:     Effort: Pulmonary effort is normal.  Musculoskeletal:     Right elbow: Swelling present. No deformity, effusion or lacerations. Decreased range of motion. Tenderness present in medial epicondyle and lateral epicondyle.     Left elbow: Normal.     Cervical back: Neck supple.     Comments: Generalized tenderness to palpation of the right elbow with decreased range of motion with extension.  Normal range of motion with flexion.  Mild swelling to the generalized right elbow without deformity, laceration, abrasion, or warmth/ecchymosis.  No redness.  5/5 grip strength bilateral upper extremities with right radial pulse +2.  Less than 3 capillary refill.  Range of motion of the right shoulder.  Skin:    General: Skin is warm and dry.     Capillary Refill: Capillary refill takes less than 2 seconds.     Findings: No rash.  Neurological:     General: No focal deficit present.     Mental Status: He is alert and oriented to person, place, and time. Mental status is at baseline.     Cranial Nerves: No dysarthria or facial asymmetry.  Psychiatric:        Mood and Affect: Mood normal.        Speech: Speech normal.        Behavior:  Behavior normal.        Thought Content: Thought content normal.  Judgment: Judgment normal.      UC Treatments / Results  Labs (all labs ordered are listed, but only abnormal results are displayed) Labs Reviewed - No data to display  EKG   Radiology No results found.  Procedures Procedures (including critical care time)  Medications Ordered in UC Medications - No data to display  Initial Impression / Assessment and Plan / UC Course  I have reviewed the triage vital signs and the nursing notes.  Pertinent labs & imaging results that were available during my care of the patient were reviewed by me and considered in my medical decision making (see chart for details).   1.  Right elbow tendinitis Presentation is consistent with acute tendinitis of the right elbow related to overuse injury.  Prednisone 40 mg once daily for the next 5 days with food is sent to pharmacy.  He has had steroids in the past without side effects or adverse allergic reaction.  He is allergic to NSAIDs and Tylenol.  RICE advised.  Walking referral to [facility] orthopedics provided for follow-up as needed. Neurovascularly intact distally. No indication for imaging today based on atraumatic mechanism of injury and stable MSK findings.  Discussed physical exam and available lab work findings in clinic with patient.  Counseled patient regarding appropriate use of medications and potential side effects for all medications recommended or prescribed today. Discussed red flag signs and symptoms of worsening condition,when to call the PCP office, return to urgent care, and when to seek higher level of care in the emergency department. Patient verbalizes understanding and agreement with plan. All questions answered. Patient discharged in stable condition.    Final Clinical Impressions(s) / UC Diagnoses   Final diagnoses:  Right elbow tendinitis     Discharge Instructions      Prednisone 40mg  once  daily for the next 5 days with food. Rest, elevate, and ice your elbow.  If you develop any new or worsening symptoms or do not improve in the next 2 to 3 days, please return.  If your symptoms are severe, please go to the emergency room.  Follow-up with your primary care provider for further evaluation and management of your symptoms as well as ongoing wellness visits.  I hope you feel better!   ED Prescriptions     Medication Sig Dispense Auth. Provider   predniSONE (DELTASONE) 20 MG tablet Take 2 tablets (40 mg total) by mouth daily for 5 days. 10 tablet Talbot Grumbling, FNP      PDMP not reviewed this encounter.   Talbot Grumbling, [place] 07/18/22 1358

## 2022-07-18 NOTE — ED Triage Notes (Signed)
Patient here today for right elbow pain X 1 week. He was in prison for about 5 days. While he was in there, he was in the cell for 23 hours out of the day for those 5 days. While he was in there, he did a lot of pushups and arm dips against the bed. Pain is a 10/10.

## 2022-07-18 NOTE — Discharge Instructions (Addendum)
Prednisone 40mg  once daily for the next 5 days with food. Rest, elevate, and ice your elbow.  If you develop any new or worsening symptoms or do not improve in the next 2 to 3 days, please return.  If your symptoms are severe, please go to the emergency room.  Follow-up with your primary care provider for further evaluation and management of your symptoms as well as ongoing wellness visits.  I hope you feel better!

## 2022-07-20 ENCOUNTER — Other Ambulatory Visit (HOSPITAL_BASED_OUTPATIENT_CLINIC_OR_DEPARTMENT_OTHER): Payer: Self-pay

## 2022-07-24 ENCOUNTER — Other Ambulatory Visit: Payer: Self-pay

## 2022-09-10 ENCOUNTER — Ambulatory Visit: Payer: Commercial Managed Care - HMO | Admitting: Podiatry

## 2022-09-10 ENCOUNTER — Ambulatory Visit (INDEPENDENT_AMBULATORY_CARE_PROVIDER_SITE_OTHER): Payer: Medicaid Other | Admitting: Podiatry

## 2022-09-10 DIAGNOSIS — B351 Tinea unguium: Secondary | ICD-10-CM | POA: Diagnosis not present

## 2022-09-10 DIAGNOSIS — M79609 Pain in unspecified limb: Secondary | ICD-10-CM

## 2022-09-10 NOTE — Progress Notes (Signed)
  Subjective:  Patient ID: [name], male    DOB: 1971-04-29,  MRN: [id number]  Chief Complaint  Patient presents with   Nail Problem    RFC    52 y.o. male presents with the above complaint. History confirmed with patient. Patient presenting with pain related to dystrophic thickened elongated nails. Patient is unable to trim own nails related to nail dystrophy and/or mobility issues. Patient does not have a history of T2DM  Objective:  Physical Exam: warm, good capillary refill nail exam severe fungal thickening and dystrophy of all nails present on both feet worse at the hallux nails.  These nails are painful with palpation.  The nails are extremely long and growing dystrophic with a to the point where they are curving down into the distal tip of the toe with subungual debris present.  Nails are brown to black in coloration. DP pulses palpable, PT pulses palpable, and protective sensation intact Left Foot:  Pain with palpation of nails due to elongation and dystrophic growth.  Right Foot: Pain with palpation of nails due to elongation and dystrophic growth.   Assessment:   1. Pain due to onychomycosis of nail   2. Onychomycosis       Plan:  Patient was evaluated and treated and all questions answered.  #Onychomycosis with pain  -Nails palliatively debrided as below. -Educated on self-care -I discussed with the patient that if the hallux nails continue to be painful we can consider removal of the hallux nails in the future however he wishes to defer at this time  Procedure: Nail Debridement Rationale: Pain Type of Debridement: manual, sharp debridement. Instrumentation: Nail nipper, rotary burr. Number of Nails: 10  Return if symptoms worsen or fail to improve.         Corinna Gab, DPM [facility] / [facility]

## 2023-01-08 ENCOUNTER — Ambulatory Visit (INDEPENDENT_AMBULATORY_CARE_PROVIDER_SITE_OTHER): Payer: Commercial Managed Care - HMO | Admitting: Podiatry

## 2023-01-08 DIAGNOSIS — M79609 Pain in unspecified limb: Secondary | ICD-10-CM | POA: Diagnosis not present

## 2023-01-08 DIAGNOSIS — B351 Tinea unguium: Secondary | ICD-10-CM

## 2023-01-08 NOTE — Progress Notes (Signed)
  Subjective:  Patient ID: [name], male    DOB: 14-Oct-1970,  MRN: [id number]  Chief Complaint  Patient presents with   Nail Problem    Routine Foot Care-nail trim     52 y.o. male presents with the above complaint. History confirmed with patient. Patient presenting with pain related to dystrophic thickened elongated nails. Patient is unable to trim own nails related to nail dystrophy and/or mobility issues. Patient does not have a history of T2DM  Objective:  Physical Exam: warm, good capillary refill nail exam severe fungal thickening and dystrophy of all nails present on both feet worse at the hallux nails.  These nails are painful with palpation.  The nails are extremely long and growing dystrophic with a to the point where they are curving down into the distal tip of the toe with subungual debris present.  Nails are brown to black in coloration. DP pulses palpable, PT pulses palpable, and protective sensation intact Left Foot:  Pain with palpation of nails due to elongation and dystrophic growth.  Right Foot: Pain with palpation of nails due to elongation and dystrophic growth.   Assessment:   1. Pain due to onychomycosis of nail   2. Onychomycosis     Plan:  Patient was evaluated and treated and all questions answered.  #Onychomycosis with pain  -Nails palliatively debrided as below. -Educated on self-care -I discussed with the patient that if the hallux nails continue to be painful we can consider removal of the hallux nails in the future however he wishes to defer at this time  Procedure: Nail Debridement Rationale: Pain Type of Debridement: manual, sharp debridement. Instrumentation: Nail nipper, rotary burr. Number of Nails: 10  Return in about 3 months (around 04/09/2023) for RFC.         Corinna Gab, DPM [facility] / [facility]

## 2023-01-21 ENCOUNTER — Encounter (HOSPITAL_COMMUNITY): Payer: Self-pay | Admitting: *Deleted

## 2023-01-21 ENCOUNTER — Ambulatory Visit (HOSPITAL_COMMUNITY)
Admission: EM | Admit: 2023-01-21 | Discharge: 2023-01-21 | Disposition: A | Discharge status: Home / Self Care | Payer: Commercial Managed Care - HMO | Attending: Internal Medicine | Admitting: Internal Medicine

## 2023-01-21 DIAGNOSIS — M545 Low back pain, unspecified: Secondary | ICD-10-CM

## 2023-01-21 MED ORDER — METHOCARBAMOL 500 MG PO TABS: 500.0000 mg | ORAL_TABLET | Freq: Every evening | ORAL | 0 refills | Status: DC | PRN

## 2023-01-21 MED ORDER — TRAMADOL HCL 50 MG PO TABS: 50.0000 mg | ORAL_TABLET | Freq: Two times a day (BID) | ORAL | 0 refills | Status: DC | PRN

## 2023-01-21 NOTE — ED Triage Notes (Signed)
Pt states he has chronic back pain but he was doing some work and had to crawl under a house the other day. He states he is having generalized back pain not specific to one location. He states he doesn't have anything to take for the pain.

## 2023-01-21 NOTE — Discharge Instructions (Addendum)
Rest the affected painful area.  Apply warm compresses intermittently as needed.  Please apply warm compress on the 20-minute on-20 minutes off cycle. As the pain recedes, begin normal activities as tolerated Please do not drive or operate heavy machinery after taking muscle relaxants because they make you drowsy. Please return to urgent care if you have worsening symptoms.

## 2023-01-22 NOTE — ED Provider Notes (Signed)
[facility]    CSN: [id number] Arrival date & time: 01/21/23  1444      History   Chief Complaint Chief Complaint  Patient presents with  . Back Pain    HPI [name] is a 52 y.o. male was to the urgent care with 1 day history of worsening lower back pain.  Patient describes the pain as sharp, constant with no known relieving factors.  Patient has not tried any over-the-counter medications.  Pain does not radiate into lower extremities.  No weakness or numbness in the lower extremities.  No urinary or bowel problems. HPI  Past Medical History:  Diagnosis Date  . Alcohol abuse   . Anemia   . Colitis   . Severe major depression with psychotic features [facility])     Patient Active Problem List   Diagnosis Date Noted  . Hip pain 10/14/2021  . Moderate cannabis use disorder ([facility]) 09/30/2021  . MDD (major depressive disorder), recurrent episode, severe ([facility]) 03/17/2019  . Essential hypertension 09/03/2014  . Tobacco use disorder 09/03/2014  . Substance abuse ([facility])   . Mild depression   . Cellulitis of nasal tip   . Chronic back pain   . Cellulitis of nose 08/26/2014  . Cellulitis 08/26/2014  . MDD (major depressive disorder), recurrent severe, without psychosis ([facility]) 06/09/2014  . 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 Alcohol dependence ([facility]) 06/08/2011  . Polysubstance abuse ([facility]) 05/31/2011    History reviewed. No pertinent surgical history.     Home Medications    Prior to Admission medications   Medication Sig Start Date End Date Taking? Authorizing Provider  methocarbamol (ROBAXIN) 500 MG tablet Take 1 tablet (500 mg total) by mouth at bedtime as needed for muscle spasms. 01/21/23  Yes [place] Mullett, Britta Mccreedy, MD  traMADol (ULTRAM) 50 MG tablet Take 1 tablet (50 mg total) by mouth every 12 (twelve) hours as needed. 01/21/23  Yes Arman Loy, Britta Mccreedy, MD  gabapentin (NEURONTIN) 300 MG capsule Take 1 capsule  (300 mg total) by mouth 3 (three) times daily. 10/14/21   Toy Cookey E, NP  nicotine (NICODERM CQ - DOSED IN MG/24 HOURS) 14 mg/24hr patch Place 1 patch (14 mg total) onto the skin daily. 10/14/21   Shanna Cisco, NP    Family History Family History  Problem Relation Age of Onset  . Hypertension Mother   . Hypertension Father   . Hypertension Other     Social History Social History   Tobacco Use  . Smoking status: Every Day    Current packs/day: 0.25    Types: Cigarettes  . Smokeless tobacco: Never  Vaping Use  . Vaping status: Never Used  Substance Use Topics  . Alcohol use: No    Comment: former  last use 5+ year ago  . Drug use: Yes    Frequency: 1.0 times per week    Types: Marijuana    Comment: 10 years 11/03     Allergies   Acetaminophen, Aspirin, Ibuprofen, Pork-derived products, and Sertraline   Review of Systems Review of Systems   Physical Exam Triage Vital Signs ED Triage Vitals  Encounter Vitals Group     BP 01/21/23 1600 125/78     Systolic BP Percentile --      Diastolic BP Percentile --      Pulse Rate 01/21/23 1600 60     Resp 01/21/23 1600 16     Temp 01/21/23 1600 98.3 F (36.8 C)  Temp Source 01/21/23 1600 Oral     SpO2 01/21/23 1600 97 %     Weight --      Height --      Head Circumference --      Peak Flow --      Pain Score 01/21/23 1558 0     Pain Loc --      Pain Education --      Exclude from Growth Chart --    No data found.  Updated Vital Signs BP 125/78 (BP Location: Left Arm)   Pulse 60   Temp 98.3 F (36.8 C) (Oral)   Resp 16   SpO2 97%   Visual Acuity Right Eye Distance:   Left Eye Distance:   Bilateral Distance:    Right Eye Near:   Left Eye Near:    Bilateral Near:     Physical Exam   UC Treatments / Results  Labs (all labs ordered are listed, but only abnormal results are displayed) Labs Reviewed - No data to display  EKG   Radiology No results found.  Procedures Procedures  (including critical care time)  Medications Ordered in UC Medications - No data to display  Initial Impression / Assessment and Plan / UC Course  I have reviewed the triage vital signs and the nursing notes.  Pertinent labs & imaging results that were available during my care of the patient were reviewed by me and considered in my medical decision making (see chart for details).     *** Final Clinical Impressions(s) / UC Diagnoses   Final diagnoses:  Acute bilateral low back pain without sciatica     Discharge Instructions      Rest the affected painful area.  Apply warm compresses intermittently as needed.  Please apply warm compress on the 20-minute on-20 minutes off cycle. As the pain recedes, begin normal activities as tolerated Please do not drive or operate heavy machinery after taking muscle relaxants because they make you drowsy. Please return to urgent care if you have worsening symptoms.    ED Prescriptions     Medication Sig Dispense Auth. Provider   methocarbamol (ROBAXIN) 500 MG tablet Take 1 tablet (500 mg total) by mouth at bedtime as needed for muscle spasms. 15 tablet Umair Rosiles, Britta Mccreedy, MD   traMADol (ULTRAM) 50 MG tablet Take 1 tablet (50 mg total) by mouth every 12 (twelve) hours as needed. 10 tablet Darchelle Nunes, Britta Mccreedy, MD      I have reviewed the PDMP during this encounter.

## 2023-02-17 ENCOUNTER — Emergency Department (HOSPITAL_COMMUNITY)
Admission: EM | Admit: 2023-02-17 | Discharge: 2023-02-18 | Disposition: A | Discharge status: Other Acute Inpatient Hospital | Payer: Commercial Managed Care - HMO | Attending: Emergency Medicine | Admitting: Emergency Medicine

## 2023-02-17 ENCOUNTER — Other Ambulatory Visit: Payer: Self-pay

## 2023-02-17 DIAGNOSIS — F1290 Cannabis use, unspecified, uncomplicated: Secondary | ICD-10-CM | POA: Diagnosis not present

## 2023-02-17 DIAGNOSIS — R45851 Suicidal ideations: Secondary | ICD-10-CM | POA: Insufficient documentation

## 2023-02-17 DIAGNOSIS — F1220 Cannabis dependence, uncomplicated: Secondary | ICD-10-CM | POA: Diagnosis not present

## 2023-02-17 DIAGNOSIS — I10 Essential (primary) hypertension: Secondary | ICD-10-CM | POA: Diagnosis not present

## 2023-02-17 DIAGNOSIS — F17210 Nicotine dependence, cigarettes, uncomplicated: Secondary | ICD-10-CM | POA: Insufficient documentation

## 2023-02-17 DIAGNOSIS — F32A Depression, unspecified: Secondary | ICD-10-CM | POA: Diagnosis present

## 2023-02-17 DIAGNOSIS — F332 Major depressive disorder, recurrent severe without psychotic features: Secondary | ICD-10-CM | POA: Diagnosis not present

## 2023-02-17 LAB — COMPREHENSIVE METABOLIC PANEL WITH GFR
ALT: 10 U/L (ref 0–44)
AST: 16 U/L (ref 15–41)
Albumin: 4.1 g/dL (ref 3.5–5.0)
Alkaline Phosphatase: 35 U/L — ABNORMAL LOW (ref 38–126)
Anion gap: 9 (ref 5–15)
BUN: 8 mg/dL (ref 6–20)
CO2: 29 mmol/L (ref 22–32)
Calcium: 9.6 mg/dL (ref 8.9–10.3)
Chloride: 101 mmol/L (ref 98–111)
Creatinine, Ser: 0.96 mg/dL (ref 0.61–1.24)
GFR, Estimated: >60 mL/min
Glucose, Bld: 96 mg/dL (ref 70–99)
Potassium: 4.6 mmol/L (ref 3.5–5.1)
Sodium: 139 mmol/L (ref 135–145)
Total Bilirubin: 0.6 mg/dL (ref 0.3–1.2)
Total Protein: 7 g/dL (ref 6.5–8.1)

## 2023-02-17 LAB — CBC
HCT: 38 % — ABNORMAL LOW (ref 39.0–52.0)
Hemoglobin: 12.1 g/dL — ABNORMAL LOW (ref 13.0–17.0)
MCH: 30.5 pg (ref 26.0–34.0)
MCHC: 31.8 g/dL (ref 30.0–36.0)
MCV: 95.7 fL (ref 80.0–100.0)
Platelets: 224 K/uL (ref 150–400)
RBC: 3.97 MIL/uL — ABNORMAL LOW (ref 4.22–5.81)
RDW: 13 % (ref 11.5–15.5)
WBC: 6.7 K/uL (ref 4.0–10.5)
nRBC: 0 % (ref 0.0–0.2)

## 2023-02-17 LAB — ACETAMINOPHEN LEVEL: Acetaminophen (Tylenol), Serum: <10 ug/mL — ABNORMAL LOW (ref 10–30)

## 2023-02-17 LAB — ETHANOL: Alcohol, Ethyl (B): <10 mg/dL (ref <10)

## 2023-02-17 LAB — SALICYLATE LEVEL: Salicylate Lvl: <7 mg/dL — ABNORMAL LOW (ref 7.0–30.0)

## 2023-02-17 NOTE — ED Triage Notes (Signed)
Patient reports feeling depressed with suicidal thoughts and auditory hallucinations this week , he did not disclose his plan at triage .

## 2023-02-17 NOTE — ED Provider Notes (Signed)
[place] EMERGENCY DEPARTMENT AT [facility] Provider Note  CSN: [id number] Arrival date & time: 02/17/23 2042  Chief Complaint(s) Suicidal  HPI [name] is a 52 y.o. male history of depression, presenting with feeling depressed.  Patient reports he is also having thoughts about wanting to hurt himself.  Not clear if he is having any specific plan but reports "feeling unsafe by himself".  Denies any drugs or alcohol use currently.  No chest pain, abdominal pain, shortness of breath, fevers or chills.  He reported auditory hallucinations to triage however to me he denies this.  Denies any homicidal ideation.  Symptoms have been present for around a week.   Past Medical History Past Medical History:  Diagnosis Date   Alcohol abuse    Anemia    Colitis    Severe major depression with psychotic features [facility])    Patient Active Problem List   Diagnosis Date Noted   Hip pain 10/14/2021   Moderate cannabis use disorder (HCC) 09/30/2021   MDD (major depressive disorder), recurrent episode, severe (HCC) 03/17/2019   Essential hypertension 09/03/2014   Tobacco use disorder 09/03/2014   Substance abuse (HCC)    Mild depression    Cellulitis of nasal tip    Chronic back pain    Cellulitis of nose 08/26/2014   Cellulitis 08/26/2014   MDD (major depressive disorder), recurrent severe, without psychosis (HCC) 06/09/2014   Major depressive disorder, recurrent, severe with psychotic features (HCC) 06/08/2011   Alcohol dependence (HCC) 06/08/2011   Polysubstance abuse (HCC) 05/31/2011   Home Medication(s) Prior to Admission medications   Medication Sig Start Date End Date Taking? Authorizing Provider  gabapentin (NEURONTIN) 300 MG capsule Take 1 capsule (300 mg total) by mouth 3 (three) times daily. 10/14/21   Shanna Cisco, NP  methocarbamol (ROBAXIN) 500 MG tablet Take 1 tablet (500 mg total) by mouth at bedtime as needed for muscle spasms. 01/21/23   Lamptey,  Britta Mccreedy, MD  nicotine (NICODERM CQ - DOSED IN MG/24 HOURS) 14 mg/24hr patch Place 1 patch (14 mg total) onto the skin daily. 10/14/21   Shanna Cisco, NP  traMADol (ULTRAM) 50 MG tablet Take 1 tablet (50 mg total) by mouth every 12 (twelve) hours as needed. 01/21/23   Lamptey, Britta Mccreedy, MD                                                                                                                                    Past Surgical History No past surgical history on file. Family History Family History  Problem Relation Age of Onset   Hypertension Mother    Hypertension Father    Hypertension Other     Social History Social History   Tobacco Use   Smoking status: Every Day    Current packs/day: 0.25    Types: Cigarettes   Smokeless tobacco: Never  Vaping Use   Vaping status: Never  Used  Substance Use Topics   Alcohol use: No    Comment: former  last use 5+ year ago   Drug use: Yes    Frequency: 1.0 times per week    Types: Marijuana    Comment: 10 years 11/03   Allergies Acetaminophen, Aspirin, Ibuprofen, Pork-derived products, and Sertraline  Review of Systems Review of Systems  All other systems reviewed and are negative.   Physical Exam Vital Signs  I have reviewed the triage vital signs BP 134/73 (BP Location: Right Arm)   Pulse 64   Temp 98.1 F (36.7 C)   Resp 18   SpO2 100%  Physical Exam Vitals and nursing note reviewed.  Constitutional:      General: He is not in acute distress.    Appearance: Normal appearance.  HENT:     Mouth/Throat:     Mouth: Mucous membranes are moist.  Eyes:     Conjunctiva/sclera: Conjunctivae normal.  Cardiovascular:     Rate and Rhythm: Normal rate and regular rhythm.  Pulmonary:     Effort: Pulmonary effort is normal. No respiratory distress.     Breath sounds: Normal breath sounds.  Abdominal:     General: Abdomen is flat.     Palpations: Abdomen is soft.     Tenderness: There is no abdominal tenderness.   Musculoskeletal:     Right lower leg: No edema.     Left lower leg: No edema.  Skin:    General: Skin is warm and dry.     Capillary Refill: Capillary refill takes less than 2 seconds.  Neurological:     Mental Status: He is alert and oriented to person, place, and time. Mental status is at baseline.  Psychiatric:     Comments: Endorses suicidal ideation" feeling unsafe"     ED Results and Treatments Labs (all labs ordered are listed, but only abnormal results are displayed) Labs Reviewed  COMPREHENSIVE METABOLIC PANEL - Abnormal; Notable for the following components:      Result Value   Alkaline Phosphatase 35 (*)    All other components within normal limits  SALICYLATE LEVEL - Abnormal; Notable for the following components:   Salicylate Lvl <7.0 (*)    All other components within normal limits  ACETAMINOPHEN LEVEL - Abnormal; Notable for the following components:   Acetaminophen (Tylenol), Serum <10 (*)    All other components within normal limits  CBC - Abnormal; Notable for the following components:   RBC 3.97 (*)    Hemoglobin 12.1 (*)    HCT 38.0 (*)    All other components within normal limits  ETHANOL  RAPID URINE DRUG SCREEN, HOSP PERFORMED                                                                                                                          Radiology No results found.  Pertinent labs & imaging results that were available during my care of the patient were reviewed  by me and considered in my medical decision making (see MDM for details).  Medications Ordered in ED Medications - No data to display                                                                                                                                   Procedures Procedures  (including critical care time)  Medical Decision Making / ED Course   MDM:  52 year old presenting to the emergency department with suicidal ideation and depression.  Patient overall  well-appearing, vital signs unremarkable.  He denies any specific medical complaints and his physical examination is unremarkable.  He endorses suicidal ideation but is vague about the details. Denies any ingestion or attempt at self harm. He is help seeking.  He is here voluntarily.  Will consult TTS for psychiatric evaluation.  Patient is medically cleared.       Lab Tests: -I ordered, reviewed, and interpreted labs.   The pertinent results include:   Labs Reviewed  COMPREHENSIVE METABOLIC PANEL - Abnormal; Notable for the following components:      Result Value   Alkaline Phosphatase 35 (*)    All other components within normal limits  SALICYLATE LEVEL - Abnormal; Notable for the following components:   Salicylate Lvl <7.0 (*)    All other components within normal limits  ACETAMINOPHEN LEVEL - Abnormal; Notable for the following components:   Acetaminophen (Tylenol), Serum <10 (*)    All other components within normal limits  CBC - Abnormal; Notable for the following components:   RBC 3.97 (*)    Hemoglobin 12.1 (*)    HCT 38.0 (*)    All other components within normal limits  ETHANOL  RAPID URINE DRUG SCREEN, HOSP PERFORMED      EKG   EKG Interpretation Date/Time:  Wednesday February 17 2023 21:21:55 EDT Ventricular Rate:  64 PR Interval:  140 QRS Duration:  88 QT Interval:  398 QTC Calculation: 410 R Axis:   82  Text Interpretation: Normal sinus rhythm Minimal voltage criteria for LVH, may be normal variant ( Sokolow-Lyon ) Borderline ECG When compared with ECG of 01-Oct-2021 15:10, No significant change since last tracing Confirmed by Alvino Blood ([id number]) on 02/17/2023 10:34:36 PM         Medicines ordered and prescription drug management: No orders of the defined types were placed in this encounter.   -I have reviewed the patients home medicines and have made adjustments as needed  Social Determinants of Health:  Diagnosis or treatment significantly  limited by social determinants of health: polysubstance abuse  Co morbidities that complicate the patient evaluation  Past Medical History:  Diagnosis Date   Alcohol abuse    Anemia    Colitis    Severe major depression with psychotic features (HCC)       Dispostion: Disposition decision including need for hospitalization was considered, and patient boarding for psychiatric evaluation  Final Clinical Impression(s) / ED Diagnoses Final diagnoses:  Suicidal thoughts     This chart was dictated using voice recognition software.  Despite best efforts to proofread,  errors can occur which can change the documentation meaning.    Lonell Grandchild, MD 02/17/23 2235

## 2023-02-18 ENCOUNTER — Inpatient Hospital Stay
Admission: RE | Admit: 2023-02-18 | Discharge: 2023-02-22 | DRG: 885 | Disposition: A | Discharge status: Home / Self Care | Payer: 59 | Source: Intra-hospital | Attending: Psychiatry | Admitting: Psychiatry

## 2023-02-18 ENCOUNTER — Encounter (HOSPITAL_COMMUNITY): Payer: Self-pay | Admitting: Psychiatry

## 2023-02-18 ENCOUNTER — Encounter: Payer: Self-pay | Admitting: Psychiatry

## 2023-02-18 ENCOUNTER — Other Ambulatory Visit: Payer: Self-pay

## 2023-02-18 DIAGNOSIS — Z8249 Family history of ischemic heart disease and other diseases of the circulatory system: Secondary | ICD-10-CM

## 2023-02-18 DIAGNOSIS — F332 Major depressive disorder, recurrent severe without psychotic features: Secondary | ICD-10-CM

## 2023-02-18 DIAGNOSIS — Z91014 Allergy to mammalian meats: Secondary | ICD-10-CM | POA: Diagnosis not present

## 2023-02-18 DIAGNOSIS — M25559 Pain in unspecified hip: Secondary | ICD-10-CM

## 2023-02-18 DIAGNOSIS — M549 Dorsalgia, unspecified: Secondary | ICD-10-CM | POA: Diagnosis present

## 2023-02-18 DIAGNOSIS — R45851 Suicidal ideations: Secondary | ICD-10-CM | POA: Diagnosis present

## 2023-02-18 DIAGNOSIS — Z811 Family history of alcohol abuse and dependence: Secondary | ICD-10-CM

## 2023-02-18 DIAGNOSIS — F1220 Cannabis dependence, uncomplicated: Secondary | ICD-10-CM | POA: Diagnosis not present

## 2023-02-18 DIAGNOSIS — G4700 Insomnia, unspecified: Secondary | ICD-10-CM | POA: Diagnosis present

## 2023-02-18 DIAGNOSIS — F17210 Nicotine dependence, cigarettes, uncomplicated: Secondary | ICD-10-CM | POA: Diagnosis present

## 2023-02-18 DIAGNOSIS — F32A Depression, unspecified: Secondary | ICD-10-CM

## 2023-02-18 DIAGNOSIS — Z886 Allergy status to analgesic agent status: Secondary | ICD-10-CM | POA: Diagnosis not present

## 2023-02-18 DIAGNOSIS — Z5941 Food insecurity: Secondary | ICD-10-CM

## 2023-02-18 DIAGNOSIS — Z888 Allergy status to other drugs, medicaments and biological substances status: Secondary | ICD-10-CM

## 2023-02-18 DIAGNOSIS — G8929 Other chronic pain: Secondary | ICD-10-CM | POA: Diagnosis present

## 2023-02-18 DIAGNOSIS — Z716 Tobacco abuse counseling: Secondary | ICD-10-CM

## 2023-02-18 DIAGNOSIS — Z79899 Other long term (current) drug therapy: Secondary | ICD-10-CM | POA: Diagnosis not present

## 2023-02-18 DIAGNOSIS — F1210 Cannabis abuse, uncomplicated: Secondary | ICD-10-CM | POA: Diagnosis present

## 2023-02-18 DIAGNOSIS — Z23 Encounter for immunization: Secondary | ICD-10-CM | POA: Diagnosis not present

## 2023-02-18 DIAGNOSIS — Z5986 Financial insecurity: Secondary | ICD-10-CM

## 2023-02-18 DIAGNOSIS — F1011 Alcohol abuse, in remission: Secondary | ICD-10-CM | POA: Diagnosis present

## 2023-02-18 LAB — RAPID URINE DRUG SCREEN, HOSP PERFORMED
Amphetamines: NOT DETECTED
Barbiturates: NOT DETECTED
Benzodiazepines: NOT DETECTED
Cocaine: NOT DETECTED
Opiates: NOT DETECTED
Tetrahydrocannabinol: POSITIVE — AB

## 2023-02-18 MED ORDER — LIDOCAINE 5 % EX PTCH
1.0000 | MEDICATED_PATCH | CUTANEOUS | Status: DC
  Administered 2023-02-18: 1 via TRANSDERMAL
  Filled 2023-02-18: qty 1

## 2023-02-18 MED ORDER — ACETAMINOPHEN 325 MG PO TABS
650.0000 mg | ORAL_TABLET | Freq: Four times a day (QID) | ORAL | Status: DC | PRN
  Filled 2023-02-18: qty 2

## 2023-02-18 MED ORDER — GABAPENTIN 300 MG PO CAPS
300.0000 mg | ORAL_CAPSULE | Freq: Three times a day (TID) | ORAL | Status: DC
  Administered 2023-02-18: 300 mg via ORAL
  Filled 2023-02-18: qty 1

## 2023-02-18 MED ORDER — LORAZEPAM 2 MG/ML IJ SOLN: 2.0000 mg | Freq: Three times a day (TID) | INTRAMUSCULAR | Status: DC | PRN

## 2023-02-18 MED ORDER — HALOPERIDOL 5 MG PO TABS: 5.0000 mg | ORAL_TABLET | Freq: Three times a day (TID) | ORAL | Status: DC | PRN

## 2023-02-18 MED ORDER — INFLUENZA VIRUS VACC SPLIT PF (FLUZONE) 0.5 ML IM SUSY
0.5000 mL | PREFILLED_SYRINGE | INTRAMUSCULAR | Status: AC
  Administered 2023-02-19: 0.5 mL via INTRAMUSCULAR
  Filled 2023-02-18: qty 0.5

## 2023-02-18 MED ORDER — HALOPERIDOL LACTATE 5 MG/ML IJ SOLN: 5.0000 mg | Freq: Three times a day (TID) | INTRAMUSCULAR | Status: DC | PRN

## 2023-02-18 MED ORDER — MIRTAZAPINE 15 MG PO TABS
15.0000 mg | ORAL_TABLET | Freq: Every day | ORAL | Status: DC
  Administered 2023-02-19 – 2023-02-21 (×3): 15 mg via ORAL
  Filled 2023-02-18 (×5): qty 1

## 2023-02-18 MED ORDER — MAGNESIUM HYDROXIDE 400 MG/5ML PO SUSP: 30.0000 mL | Freq: Every day | ORAL | Status: DC | PRN

## 2023-02-18 MED ORDER — TRAZODONE HCL 50 MG PO TABS
50.0000 mg | ORAL_TABLET | Freq: Every evening | ORAL | Status: DC | PRN
  Administered 2023-02-19 – 2023-02-21 (×2): 50 mg via ORAL
  Filled 2023-02-18 (×4): qty 1

## 2023-02-18 MED ORDER — ALUM & MAG HYDROXIDE-SIMETH 200-200-20 MG/5ML PO SUSP: 30.0000 mL | ORAL | Status: DC | PRN

## 2023-02-18 MED ORDER — DIPHENHYDRAMINE HCL 50 MG/ML IJ SOLN: 50.0000 mg | Freq: Three times a day (TID) | INTRAMUSCULAR | Status: DC | PRN

## 2023-02-18 MED ORDER — METHOCARBAMOL 500 MG PO TABS
500.0000 mg | ORAL_TABLET | Freq: Once | ORAL | Status: AC
  Administered 2023-02-18: 500 mg via ORAL
  Filled 2023-02-18: qty 1

## 2023-02-18 MED ORDER — MIRTAZAPINE 15 MG PO TABS: 15.0000 mg | ORAL_TABLET | Freq: Every day | ORAL | Status: DC

## 2023-02-18 MED ORDER — LORAZEPAM 2 MG PO TABS: 2.0000 mg | ORAL_TABLET | Freq: Three times a day (TID) | ORAL | Status: DC | PRN

## 2023-02-18 MED ORDER — LIDOCAINE 5 % EX PTCH
1.0000 | MEDICATED_PATCH | CUTANEOUS | Status: DC
  Administered 2023-02-19 – 2023-02-22 (×4): 1 via TRANSDERMAL
  Filled 2023-02-18 (×4): qty 1

## 2023-02-18 MED ORDER — GABAPENTIN 300 MG PO CAPS
300.0000 mg | ORAL_CAPSULE | Freq: Three times a day (TID) | ORAL | Status: DC
  Administered 2023-02-19 – 2023-02-22 (×9): 300 mg via ORAL
  Filled 2023-02-18 (×10): qty 1

## 2023-02-18 MED ORDER — NICOTINE 21 MG/24HR TD PT24: 21.0000 mg | MEDICATED_PATCH | Freq: Every day | TRANSDERMAL | Status: DC

## 2023-02-18 MED ORDER — DIPHENHYDRAMINE HCL 25 MG PO CAPS: 50.0000 mg | ORAL_CAPSULE | Freq: Three times a day (TID) | ORAL | Status: DC | PRN

## 2023-02-18 NOTE — Progress Notes (Signed)
   02/18/23 1803  Provider Notification  Provider Name/Title Earlene Plater, NP  Date Provider Notified 02/18/23  Time Provider Notified 1903  Method of Notification Page (via secure chat)  Notification Reason Red med refusal

## 2023-02-18 NOTE — Consult Note (Addendum)
BH ED ASSESSMENT   Reason for Consult: Psych Consult,  Referring Physician:   Lonell Grandchild, MD   Patient Identification: [name] MRN:  [id number] ED Chief Complaint: MDD (major depressive disorder), recurrent severe, without psychosis (HCC)  Diagnosis:  Principal Problem:   MDD (major depressive disorder), recurrent severe, without psychosis (HCC) Active Problems:   Moderate cannabis use disorder [facility])   ED Assessment Time Calculation: Start Time: 0800 Stop Time: 0900 Total Time in Minutes (Assessment Completion): 60   Subjective:    [name] is a 52 y.o. AA male with a past psychiatric history of MDD with and without psychotic features, cannabis use disorder, and EtOH use disorder, with pertinent medical comorbidities that include chronic back pain, who presented this encounter by way of a friend due to worsening depressive mood with suicidal thoughts and auditory hallucinations x 1 week.  Patient is currently voluntary and medically cleared per EDP team.  HPI:   Patient seen today at the [facility] emergency department for face-to-face psychiatric evaluation.  Upon evaluation, patient tells me that for a while now he has been having worsening of his mental health in the form of increasing depression due to psychosocial stressors, but particularly over this last 1 to 2 weeks he has been really starting to struggle, states that he has been feeling overwhelmed due to inconsistencies with work available for him to make money to provide for himself, has been getting behind on rent payments and other things that need to be paid for his day-to-day life because of this, has been smoking more cannabis lately than usual (states smoking about almost daily now), has been having increased difficulties with child support legal battles (reports being threatened with prison time due to inability to pay child support), has been having difficulty managing his chronic medical  comorbidities i.e. chronic back pain, and has been struggling with having limited support in the area due to family living out of state largely, outside of his daughter who lives here in [place] but does not talk to him.  Patient endorses that he does not have active suicidal thoughts with a plan, states that, "I never told anyone that I have been having thoughts of killing myself, but I have said that I have been really feeling unsafe lately and wanting to hurt myself, and I really want inpatient treatment, I feel like I need it right now, and so does my friend who dropped me off yesterday".  Patient endorses outside of the safe and secure environment of the hospital he worries about himself for example he states performing self injurious behavior by way of cutting and not stopping, states, "like I feel like if I just keep going down this path and getting more depressed, I am just going to like cut myself, and I worry that I won't stop and I will end up actually dying".  Patient endorses no past history of suicide attempts and/or self injurious behavior, but states that he had thoughts about suicide in the past, states that he has considered things such as overdosing on pain medications he takes for his chronic back pain.  Patient endorses he has not been sleeping very well due to increases in his chronic back pain and depressive mood, also states that he has been eating pretty variably because of the aforementioned.  Patient endorses he is not having auditory hallucinations, states that during triage the nurse was mistaken, states that he told her he has heard voices in the past about  a year ago when depressed, but states that he has not heard voices since then, confirms during our conversation he is not currently hearing auditory hallucinations and/or experiencing visual hallucinations.  Objectively, the patient does not appear to be responding to internal stimuli, experiencing paranoid ideations, and/or  expressing delusional themes.  Patient orientation is intact upon assessment.  Patient endorses outside of cannabis use, smokes about half pack to a pack of cigarettes a day depending on his financial situation and work, denies any other drug use, and denies any relapses on EtOH, confirms he has been sober from alcohol for many years now (consistent with chart review).  Patient endorses no outpatient mental services for medication management and/or therapy past or present.  Patient endorses a history of services at [facility] for rehabilitation from EtOH abuse.  Patient endorses no psychiatric medications currently, but endorses in the past he has been on medications.  Patient reports multiple hospitalizations in the past for decompensation of his mental health.  Patient endorses desire for stabilization of his mental health with medications and therapy.  Patient denies homicidal ideations.  Past Psychiatric History: As above and below  Per Dr. Abbott Pao 09/30/2021  Previous Psych Diagnoses: MDD, Substance Use Disorder, Note from 2010 [facility] endorses MDD w/ psychotic features w/ discharged medications from emergency room including risperdol and ambien Prior inpatient treatment: [facility] 3 times (MDD x2, EtOH abuse x1), and multiple other ED visits dating back earlier than 2010  Current/prior outpatient treatment: None. Prior rehab hx: [facility] for EtOH abuse (8-8.5 years sober) Psychotherapy hx: None. History of suicide: None. History of homicide: None. Psychiatric medication history: Zyprexa, Ativan, Geodon, Atarax, Trazodone, Librium, Risperdal, Effexor, Ambien Psychiatric medication compliance history: Reports never taking psychiatric medication outside of the hospital. Current Psychiatrist: None. Current therapist: None.  Risk to Self or Others: Is the patient at risk to self? Yes Has the patient been a risk to self in the past 6 months? No Has the patient been a risk to self within the distant  past? Yes Is the patient a risk to others? No Has the patient been a risk to others in the past 6 months? No Has the patient been a risk to others within the distant past? No  [place] Scale:  Flowsheet Row ED from 02/17/2023 in [facility] Emergency Department at [facility] ED from 01/21/2023 in [facility] at [facility] ED from 07/18/2022 in [facility] at [facility] RISK CATEGORY High Risk No Risk No Risk      Substance Abuse:  Alcohol / Drug Use Pain Medications: See [facility] Prescriptions: See MAR Over the Counter: See MAR History of alcohol / drug use?: Yes Longest period of sobriety (when/how long): Per chart pt has a hx of alcoholism but denies he currently drinks. Pt smokes marijuana on a regular basis. Negative Consequences of Use: Financial, Personal relationships Withdrawal Symptoms: None  Per Dr. Abbott Pao 09/30/2021  Alcohol: Sober for 8-8.5 years Tobacco: 1/2 pack a day Illicit drugs: Marijuana (Confirmed on UDS) Rx drug abuse:None Reported Rehab hx: [facility] for EtOH  Past Medical History:  Past Medical History:  Diagnosis Date   Alcohol abuse    Anemia    Colitis    Severe major depression with psychotic features (HCC)    History reviewed. No pertinent surgical history. Family History:  Family History  Problem Relation Age of Onset   Hypertension Mother    Hypertension Father    Hypertension Other  Family Psychiatric  History: None endorsed Social History:  Social History   Substance and Sexual Activity  Alcohol Use No   Comment: former  last use 5+ year ago     Social History   Substance and Sexual Activity  Drug Use Yes   Frequency: 5.0 times per week   Types: Marijuana   Comment: 10 years 11/03    Social History   Socioeconomic History   Marital status: Single    Spouse name: Not on file   Number of children: Not on file   Years of education: Not on file   Highest education level: Not on file   Occupational History   Not on file  Tobacco Use   Smoking status: Every Day    Current packs/day: 0.25    Types: Cigarettes   Smokeless tobacco: Never  Vaping Use   Vaping status: Never Used  Substance and Sexual Activity   Alcohol use: No    Comment: former  last use 5+ year ago   Drug use: Yes    Frequency: 5.0 times per week    Types: Marijuana    Comment: 10 years 11/03   Sexual activity: Yes    Birth control/protection: Condom  Other Topics Concern   Not on file  Social History Narrative   Not on file   Social Determinants of Health   Financial Resource Strain: High Risk (03/19/2019)   Overall Financial Resource Strain (CARDIA)    Difficulty of Paying Living Expenses: Very hard  Food Insecurity: Food Insecurity Present (03/19/2019)   Hunger Vital Sign    Worried About Running Out of Food in the Last Year: Sometimes true    Ran Out of Food in the Last Year: Sometimes true  Transportation Needs: No Transportation Needs (03/19/2019)   PRAPARE - Administrator, Civil Service (Medical): No    Lack of Transportation (Non-Medical): No  Physical Activity: Not on file  Stress: Not on file  Social Connections: Not on file   Additional Social History:    Allergies:   Allergies  Allergen Reactions   Acetaminophen Nausea Only   Aspirin Hives and Nausea And Vomiting   Ibuprofen Hives and Nausea And Vomiting   Pork-Derived Products Other (See Comments)    Stomach pain. Pt does not eat pork   Sertraline Diarrhea and Other (See Comments)    Acute GI distress with diarrhea and pain.     Labs:  Results for orders placed or performed during the hospital encounter of 02/17/23 (from the past 48 hour(s))  Comprehensive metabolic panel     Status: Abnormal   Collection Time: 02/17/23  9:12 PM  Result Value Ref Range   Sodium 139 135 - 145 mmol/L   Potassium 4.6 3.5 - 5.1 mmol/L   Chloride 101 98 - 111 mmol/L   CO2 29 22 - 32 mmol/L   Glucose, Bld 96 70 - 99  mg/dL    Comment: Glucose reference range applies only to samples taken after fasting for at least 8 hours.   BUN 8 6 - 20 mg/dL   Creatinine, Ser 5.64 0.61 - 1.24 mg/dL   Calcium 9.6 8.9 - 33.2 mg/dL   Total Protein 7.0 6.5 - 8.1 g/dL   Albumin 4.1 3.5 - 5.0 g/dL   AST 16 15 - 41 U/L   ALT 10 0 - 44 U/L   Alkaline Phosphatase 35 (L) 38 - 126 U/L   Total Bilirubin 0.6 0.3 - 1.2 mg/dL  GFR, Estimated >60 >60 mL/min    Comment: (NOTE) Calculated using the CKD-EPI Creatinine Equation (2021)    Anion gap 9 5 - 15    Comment: Performed at [facility], [address] [place], [place] [address]  Ethanol     Status: None   Collection Time: 02/17/23  9:12 PM  Result Value Ref Range   Alcohol, Ethyl (B) <10 <10 mg/dL    Comment: (NOTE) Lowest detectable limit for serum alcohol is 10 mg/dL.  For medical purposes only. Performed at [facility], [address], [place], [place] [address]   Salicylate level     Status: Abnormal   Collection Time: 02/17/23  9:12 PM  Result Value Ref Range   Salicylate Lvl <7.0 (L) 7.0 - 30.0 mg/dL    Comment: Performed at [facility], [address], [place], [place] [address]  Acetaminophen level     Status: Abnormal   Collection Time: 02/17/23  9:12 PM  Result Value Ref Range   Acetaminophen (Tylenol), Serum <10 (L) 10 - 30 ug/mL    Comment: (NOTE) Therapeutic concentrations vary significantly. A range of 10-30 ug/mL  may be an effective concentration for many patients. However, some  are best treated at concentrations outside of this range. Acetaminophen concentrations >150 ug/mL at 4 hours after ingestion  and >50 ug/mL at 12 hours after ingestion are often associated with  toxic reactions.  Performed at [facility], [address], [place], [place] [address]   CBC     Status: Abnormal   Collection Time: 02/17/23  9:12 PM  Result Value Ref Range   WBC 6.7 4.0 - 10.5 K/uL   RBC 3.97 (L) 4.22 - 5.81 MIL/uL    Hemoglobin 12.1 (L) 13.0 - 17.0 g/dL   HCT 51.7 (L) 61.6 - 07.3 %   MCV 95.7 80.0 - 100.0 fL   MCH 30.5 26.0 - 34.0 pg   MCHC 31.8 30.0 - 36.0 g/dL   RDW 71.0 62.6 - 94.8 %   Platelets 224 150 - 400 K/uL   nRBC 0.0 0.0 - 0.2 %    Comment: Performed at [facility], [address], [place], [place] [address]    Current Facility-Administered Medications  Medication Dose Route Frequency Provider Last Rate Last Admin   gabapentin (NEURONTIN) capsule 300 mg  300 mg Oral TID Dione Booze, MD       lidocaine (LIDODERM) 5 % 1 patch  1 patch Transdermal Q24H Gwyneth Sprout, MD       methocarbamol (ROBAXIN) tablet 500 mg  500 mg Oral Once Gwyneth Sprout, MD       Current Outpatient Medications  Medication Sig Dispense Refill   gabapentin (NEURONTIN) 300 MG capsule Take 1 capsule (300 mg total) by mouth 3 (three) times daily. (Patient not taking: Reported on 02/17/2023) 90 capsule 3   methocarbamol (ROBAXIN) 500 MG tablet Take 1 tablet (500 mg total) by mouth at bedtime as needed for muscle spasms. (Patient not taking: Reported on 02/17/2023) 15 tablet 0   nicotine (NICODERM CQ - DOSED IN MG/24 HOURS) 14 mg/24hr patch Place 1 patch (14 mg total) onto the skin daily. (Patient not taking: Reported on 02/17/2023) 28 patch 3   traMADol (ULTRAM) 50 MG tablet Take 1 tablet (50 mg total) by mouth every 12 (twelve) hours as needed. (Patient not taking: Reported on 02/17/2023) 10 tablet 0    Musculoskeletal: Strength & Muscle Tone: within normal limits  Gait & Station: normal Patient leans: N/A   Psychiatric Specialty Exam: Presentation  General Appearance:  Appropriate for Environment  Eye Contact: Fair  Speech: Clear and Coherent; Normal Rate  Speech Volume: Normal  Handedness: Right   Mood and Affect  Mood: Depressed  Affect: Other (comment) (neutral to depressed)   Thought Process  Thought Processes: Other (comment) (Superficially linear and goal directed to  frequently vague)  Descriptions of Associations:Intact  Orientation:Full (Time, Place and Person)  Thought Content:Logical  History of Schizophrenia/Schizoaffective disorder:No  Duration of Psychotic Symptoms:No data recorded Hallucinations:Hallucinations: None  Ideas of Reference:None  Suicidal Thoughts:Suicidal Thoughts: Yes, Passive SI Passive Intent and/or Plan: Without Intent; Without Plan; With Access to Means; With Means to Carry Out  Homicidal Thoughts:Homicidal Thoughts: No   Sensorium  Memory: Immediate Fair; Recent Fair; Remote Fair  Judgment: Intact  Insight: Present   Executive Functions  Concentration: Fair  Attention Span: Fair  Recall: [employer] of Knowledge: Fair  Language: Fair   Psychomotor Activity  Psychomotor Activity: Psychomotor Activity: Normal   Assets  Assets: Communication Skills; Desire for Improvement; Housing; Leisure Time; Resilience; Social Support; Talents/Skills; Vocational/Educational    Sleep  Sleep: Sleep: Poor   Physical Exam: Physical Exam Vitals and nursing note reviewed.  Constitutional:      General: He is not in acute distress.    Appearance: Normal appearance. He is normal weight. He is not ill-appearing, toxic-appearing or diaphoretic.  Pulmonary:     Effort: Pulmonary effort is normal.  Skin:    General: Skin is warm and dry.  Neurological:     Mental Status: He is alert and oriented to person, place, and time.  Psychiatric:        Attention and Perception: Attention and perception normal. He does not perceive auditory or visual hallucinations.        Mood and Affect: Mood is depressed. Affect is flat.        Speech: Speech normal.        Behavior: Behavior normal. Behavior is not agitated, slowed, aggressive, withdrawn, hyperactive or combative. Behavior is cooperative.        Thought Content: Thought content is not paranoid or delusional. Thought content includes suicidal (Passive  thoughts of self harm) ideation. Thought content does not include homicidal ideation.        Cognition and Memory: Cognition and memory normal.        Judgment: Judgment normal.    Review of Systems  Constitutional:  Positive for malaise/fatigue.  Musculoskeletal:  Positive for back pain.  Psychiatric/Behavioral:  Positive for depression, substance abuse (Cannabis) and suicidal ideas (Passive and vague). Negative for hallucinations. The patient has insomnia. The patient is not nervous/anxious.   All other systems reviewed and are negative.  Blood pressure (!) 103/57, pulse 75, temperature 97.6 F (36.4 C), temperature source Oral, resp. rate 18, SpO2 99%. There is no height or weight on file to calculate BMI.  Medical Decision Making:  Patient presented this encounter by way of a friend due to worsening depressive mood with suicidal thoughts and auditory hallucinations x 1 week.  Upon evaluation, patient presents with symptomology most consistent with the patient's current and historical diagnosis of unipolar major depression without psychotic features and moderate cannabis use disorder (refutes reports from triage of experiencing AH).  Patient reports he is amenable to inpatient mental health hospitalization and medication management, however, if patient should choose to attempt to not move forward with inpatient hospitalization for his mental health,  patient does meet involuntary commitment criteria, and should be placed under involuntary commitment and/or reevaluated before being discharged from the safe and secure environment hospital.  CSW team will have the patient fax out, as well as [facility] will review, and psychiatry will continue to follow the patient until disposition is obtained.  Recommendations  #MDD (major depressive disorder), recurrent severe, without psychosis (HCC) #Moderate cannabis use disorder (HCC)  -Recommend inpatient mental health hospitalization -Recommend restart  previous medication of mirtazapine 15 mg p.o. nightly -Recommend safety precautions -Recommend involuntary commitment, if patient should choose to attempt to leave before inpatient hospitalization and/or reevaluation by provider team  Disposition: Recommend psychiatric Inpatient admission when medically cleared.  Lenox Ponds, NP 02/18/2023 9:15 AM

## 2023-02-18 NOTE — ED Notes (Signed)
Pt reports thoughts of self harm, but not of killing himself.

## 2023-02-18 NOTE — BH Assessment (Addendum)
Comprehensive Clinical Assessment (CCA) Note  02/18/2023 [name] [id number] Disposition; Clinician discussed patient care with NP Sindy Guadeloupe.  He recommended overnight observation in the ED and for patient to be seen by psychiatry on 10/17.  Clinician informed RN Gabriel Rung of disposition via secure messaging.  Pt has normal eye contact but is oriented x3.  He is not oriented to day/date.  Patient is not responding to internal stimuli.  He does not evidence any delusional thought patterns.  He appears tired and complains of back pain.  Pt has no current outpatient care.     Chief Complaint:  Chief Complaint  Patient presents with   Suicidal   Visit Diagnosis: MDD recurrent, severe    CCA Screening, Triage and Referral (STR)  Patient Reported Information How did you hear about [place]? Family/Friend  What Is the Reason for Your Visit/Call Today? Pt had someone to drop him off at [facility].  Pt has had these SI for the last week.  No precendents other than some people who passed away.  Oldest daughter died in Sep 11, 2018.  He has no plan at this time and no previous attempts.  Pt denies any HI or A/V hallucinations.  Pt uses marijuana about once a week.  Denies any use of ETOH or other substances.  Patient denies any access to guns. Has no outpatient care.  Does not feel safe at this time.  How Long Has This Been Causing You Problems? <Week  What Do You Feel Would Help You the Most Today? Treatment for Depression or other mood problem   Have You Recently Had Any Thoughts About Hurting Yourself? Yes  Are You Planning to Commit Suicide/Harm Yourself At This time? No   Flowsheet Row ED from 02/17/2023 in [facility] Emergency Department at [facility] ED from 01/21/2023 in [facility] Ur[facility] Care at [facility] ED from 07/18/2022 in [facility] at [facility] RISK CATEGORY High Risk No Risk No Risk       Have you Recently Had Thoughts About Hurting Someone  [name]? No  Are You Planning to Harm Someone at This Time? No  Explanation: Pt has SI but no plan currently.  No HI.   Have You Used Any Alcohol or Drugs in the Past 24 Hours? No  What Did You Use and How Much? Pt denies   Do You Currently Have a Therapist/Psychiatrist? No  Name of Therapist/Psychiatrist: Name of Therapist/Psychiatrist: "No, but I need one."   Have You Been Recently Discharged From Any Office Practice or Programs? No  Explanation of Discharge From Practice/Program: No recent discharges     CCA Screening Triage Referral Assessment Type of Contact: Tele-Assessment  Telemedicine Service Delivery:   Is this Initial or Reassessment? Is this Initial or Reassessment?: Initial Assessment  Date Telepsych consult ordered in [facility]:  Date Telepsych consult ordered in [facility]: 02/17/23  Time Telepsych consult ordered in [facility]:  Time Telepsych consult ordered in [facility]: 2236  Location of Assessment: [facility] ED  Provider Location: [facility] Assessment Services   Collateral Involvement: Pt declined   Does Patient Have a Automotive engineer Guardian? No  Legal Guardian Contact Information: Patient has no legal guardian.  Copy of Legal Guardianship Form: -- (Patient has no legal guardian.)  Legal Guardian Notified of Arrival: -- (Patient has no legal guardian.)  Legal Guardian Notified of Pending Discharge: -- (Patient has no legal guardian.)  If Minor and Not Living with Parent(s), Who has Custody? Pt is  an adult  Is CPS involved or ever been involved? Never  Is APS involved or ever been involved? Never   Patient Determined To Be At Risk for Harm To Self or Others Based on Review of Patient Reported Information or Presenting Complaint? Yes, for Self-Harm  Method: No Plan  Availability of Means: No access or NA  Intent: Vague intent or NA  Notification Required: No need or identified person  Additional Information for Danger to Others Potential: -- (No  HI.)  Additional Comments for Danger to Others Potential: No danger to others potential.  Are There Guns or Other Weapons in Your Home? No  Types of Guns/Weapons: Patient denies any accesss to guns.  Are These Weapons Safely Secured?                            No  Who Could Verify You Are Able To Have These Secured: No guns to safely secure.  Do You Have any Outstanding Charges, Pending Court Dates, Parole/Probation? Pt denies any charges.  Contacted To Inform of Risk of Harm To Self or Others: Other: Comment (No HI.)    Does Patient Present under Involuntary Commitment? No    [place] of Residence: [place]   Patient Currently Receiving the Following Services: Not Receiving Services   Determination of Need: Urgent (48 hours)   Options For Referral: Other: Comment (Overnight observation in ED.)     CCA Biopsychosocial Patient Reported Schizophrenia/Schizoaffective Diagnosis in Past: No   Strengths: Pt is seeking assistance for his mental health concerns. He answered the questions posed. Pt is able to identify his thoughts, feelings, and concerns.   Mental Health Symptoms Depression:   Difficulty Concentrating; Fatigue; Hopelessness; Sleep (too much or little)   Duration of Depressive symptoms:  Duration of Depressive Symptoms: Less than two weeks   Mania:   None   Anxiety:    Worrying; Tension; Sleep   Psychosis:   None   Duration of Psychotic symptoms:    Trauma:   Emotional numbing; Detachment from others   Obsessions:   None   Compulsions:   None   Inattention:   None   Hyperactivity/Impulsivity:   None   Oppositional/Defiant Behaviors:   None   Emotional Irregularity:   Potentially harmful impulsivity; Mood lability; Recurrent suicidal behaviors/gestures/threats   Other Mood/Personality Symptoms:   None noted    Mental Status Exam Appearance and self-care  Stature:   Tall   Weight:   Thin   Clothing:   -- (Pt in scrubs)    Grooming:   Normal   Cosmetic use:   None   Posture/gait:   Normal   Motor activity:   Not Remarkable   Sensorium  Attention:   Normal   Concentration:   Normal   Orientation:   Situation; Place; Person; Object   Recall/memory:   Normal   Affect and Mood  Affect:   Anxious; Depressed   Mood:   Anxious; Depressed   Relating  Eye contact:   Normal   Facial expression:   Depressed   Attitude toward examiner:   Cooperative   Thought and Language  Speech flow:  Clear and Coherent   Thought content:   Appropriate to Mood and Circumstances   Preoccupation:   None   Hallucinations:   None   Organization:   Logical; Coherent   [employer] of Knowledge:   Average   Intelligence:   Average  Abstraction:   Normal   Judgement:   Fair   Reality Testing:   Adequate   Insight:   Poor   Decision Making:   Impulsive   Social Functioning  Social Maturity:   Isolates   Social Judgement:   Heedless   Stress  Stressors:   Family conflict; Grief/losses; Housing; Surveyor, quantity; Work   Coping Ability:   Deficient supports; Overwhelmed   Skill Deficits:   Decision making; Responsibility   Supports:   Support needed     Religion: Religion/Spirituality Are You A Religious Person?: Yes What is Your Religious Affiliation?: Christian How Might This Affect Treatment?: No affect on treatment  Leisure/Recreation: Leisure / Recreation Do You Have Hobbies?: No  Exercise/Diet: Exercise/Diet Do You Exercise?: Yes What Type of Exercise Do You Do?: Run/Walk How Many Times a Week Do You Exercise?: 6-7 times a week Have You Gained or Lost A Significant Amount of Weight in the Past Six Months?: No Do You Follow a Special Diet?: No Do You Have Any Trouble Sleeping?: Yes Explanation of Sleeping Difficulties: Pt has trouble with getting to sleep due to back pain.   CCA Employment/Education Employment/Work  Situation: Employment / Work Situation Employment Situation: Unemployed Patient's Job has Been Impacted by Current Illness: No Has Patient ever Been in Equities trader?: No  Education: Education Is Patient Currently Attending School?: No Last Grade Completed:  (GED) Did You Attend College?: No Did You Have An Individualized Education Program (IIEP): No Did You Have Any Difficulty At School?: No Patient's Education Has Been Impacted by Current Illness: No   CCA Family/Childhood History Family and Relationship History: Family history Marital status: Single Does patient have children?: Yes How many children?: 4 How is patient's relationship with their children?: Per chart: deceased son, deceased daughter (died in 04/21/20from COVID); younger daughter (wonderful relationship) - older daughter (not a good relationship because of his alcoholism)  Childhood History:  Childhood History By whom was/is the patient raised?: Both parents Did patient suffer any verbal/emotional/physical/sexual abuse as a child?: No Did patient suffer from severe childhood neglect?: No Has patient ever been sexually abused/assaulted/raped as an adolescent or adult?: No Was the patient ever a victim of a crime or a disaster?: No Witnessed domestic violence?: Yes Has patient been affected by domestic violence as an adult?: No Description of domestic violence: Pt does not divulge.       CCA Substance Use Alcohol/Drug Use: Alcohol / Drug Use Pain Medications: See MAR Prescriptions: See MAR Over the Counter: See MAR History of alcohol / drug use?: Yes Longest period of sobriety (when/how long): Per chart pt has a hx of alcoholism but denies he currently drinks. Pt smokes marijuana on a regular basis. Negative Consequences of Use: Financial, Personal relationships Withdrawal Symptoms: None Substance #1 Name of Substance 1: Marijuana 1 - Age of First Use: unknown 1 - Amount (size/oz): varies 1 -  Frequency: "about once a week" 1 - Duration: ongoing 1 - Last Use / Amount: Pt can't recall 1 - Method of Aquiring: illegal purchase 1- Route of Use: smoking                       ASAM's:  Six Dimensions of Multidimensional Assessment  Dimension 1:  Acute Intoxication and/or Withdrawal Potential:      Dimension 2:  Biomedical Conditions and Complications:      Dimension 3:  Emotional, Behavioral, or Cognitive Conditions and Complications:     Dimension 4:  Readiness to Change:     Dimension 5:  Relapse, Continued use, or Continued Problem Potential:     Dimension 6:  Recovery/Living Environment:     ASAM Severity Score:    ASAM Recommended Level of Treatment:     Substance use Disorder (SUD)    Recommendations for Services/Supports/Treatments:    Discharge Disposition:    DSM5 Diagnoses: Patient Active Problem List   Diagnosis Date Noted   Hip pain 10/14/2021   Moderate cannabis use disorder (HCC) 09/30/2021   MDD (major depressive disorder), recurrent episode, severe (HCC) 03/17/2019   Essential hypertension 09/03/2014   Tobacco use disorder 09/03/2014   Substance abuse (HCC)    Mild depression    Cellulitis of nasal tip    Chronic back pain    Cellulitis of nose 08/26/2014   Cellulitis 08/26/2014   MDD (major depressive disorder), recurrent severe, without psychosis (HCC) 06/09/2014   Major depressive disorder, recurrent, severe with psychotic features (HCC) 06/08/2011   Alcohol dependence (HCC) 06/08/2011   Polysubstance abuse (HCC) 05/31/2011     Referrals to Alternative Service(s): Referred to Alternative Service(s):   Place:   Date:   Time:    Referred to Alternative Service(s):   Place:   Date:   Time:    Referred to Alternative Service(s):   Place:   Date:   Time:    Referred to Alternative Service(s):   Place:   Date:   Time:     Wandra Mannan

## 2023-02-18 NOTE — Tx Team (Signed)
Initial Treatment Plan 02/18/2023 6:31 PM [name] [id number]    PATIENT STRESSORS: Financial difficulties   Loss of oldest daughter in 2020   Substance abuse     PATIENT STRENGTHS: Ability for insight  Forensic psychologist fund of knowledge  Motivation for treatment/growth    PATIENT IDENTIFIED PROBLEMS: Depression  Self-harm thoughts  Substance use  Financial difficulties               DISCHARGE CRITERIA:  Ability to meet basic life and health needs Improved stabilization in mood, thinking, and/or behavior Medical problems require only outpatient monitoring Need for constant or close observation no longer present Reduction of life-threatening or endangering symptoms to within safe limits  PRELIMINARY DISCHARGE PLAN: Outpatient therapy Return to previous living arrangement Return to previous work or school arrangements  PATIENT/FAMILY INVOLVEMENT: This treatment plan has been presented to and reviewed with the patient, [name]. The patient has been given the opportunity to ask questions and make suggestions.  Brittnei Jagiello, RN 02/18/2023, 6:31 PM

## 2023-02-18 NOTE — Progress Notes (Signed)
Admission Note:   Report was received from [place], [place] on a 52 year old male who presents Voluntary in no acute distress for the treatment of SI and Depression. Patient appears flat and depressed. Patient was calm and cooperative with admission process. Patient presents with self-harm thoughts, "I was scared to go home", which is why he rated his depression a "10/10". Patient stated that he has some financial issues going on and his bills are pilling up and "my living situation, they keep going up on my rent, and sometimes I be ready to give up". Patient also expressed that he lost his oldest daughter back in 2020. Patient endorsed chronic back pain, rating his pain a "10/10", but stated that nothing helps. Patient denied HI/AVH and anxiety to this Clinical research associate. Patient has a past medical history of Anemia. Skin was assessed with Minerva Areola, MHT and found to be clear of any abnormal marks. Patient searched and no contraband found and unit policies explained and understanding verbalized. Consents obtained. Food and fluids offered, and both accepted. Patient had no additional questions or concerns to voice to this Clinical research associate. Patient remains safe on the unit.

## 2023-02-18 NOTE — Progress Notes (Signed)
Pt has been accepted to [facility] 02/18/2023, pending VOL consent and fax that to [phone].   Bed assignment: 310  Pt meets inpatient criteria per Arsenio Loader, NP  Attending Physician will be Dr Marlou Porch  Report can called to: -Adult unit: [phone]  Pt can arrive after 1300-1330 as this bed is not open until discharges occur.  Care Team Notified: [facility]  Malva Limes RN, Arsenio Loader NP,  and Denton Ar RN    [place], [facility]

## 2023-02-18 NOTE — ED Notes (Signed)
Pt ambulated out to safe transport w/ steady gait. Pt calm, cooperative, VSS at time of departure. All paperwork and belongings given to safe transport.

## 2023-02-18 NOTE — Group Note (Signed)
Date:  02/18/2023 Time:  9:00 PM  Group Topic/Focus:  Goals Group:   The focus of this group is to help patients establish daily goals to achieve during treatment and discuss how the patient can incorporate goal setting into their daily lives to aide in recovery.    Participation Level:  Active  Participation Quality:  Appropriate  Affect:  Appropriate  Cognitive:  Confused  Insight: Lacking  Engagement in Group:  Engaged  Modes of Intervention:  Discussion  Lenore Cordia 02/18/2023, 9:00 PM

## 2023-02-18 NOTE — ED Notes (Signed)
TTS in process

## 2023-02-18 NOTE — Progress Notes (Signed)
Patient refused scheduled Gabapentin, stating that it makes him hurt worse.

## 2023-02-18 NOTE — Plan of Care (Signed)
New admission.  Problem: Education: Goal: Knowledge of General Education information will improve Description: Including pain rating scale, medication(s)/side effects and non-pharmacologic comfort measures Outcome: Not Progressing   Problem: Health Behavior/Discharge Planning: Goal: Ability to manage health-related needs will improve Outcome: Not Progressing   Problem: Clinical Measurements: Goal: Ability to maintain clinical measurements within normal limits will improve Outcome: Not Progressing Goal: Will remain free from infection Outcome: Not Progressing Goal: Diagnostic test results will improve Outcome: Not Progressing Goal: Respiratory complications will improve Outcome: Not Progressing Goal: Cardiovascular complication will be avoided Outcome: Not Progressing   Problem: Activity: Goal: Risk for activity intolerance will decrease Outcome: Not Progressing   Problem: Nutrition: Goal: Adequate nutrition will be maintained Outcome: Not Progressing   Problem: Coping: Goal: Level of anxiety will decrease Outcome: Not Progressing   Problem: Elimination: Goal: Will not experience complications related to bowel motility Outcome: Not Progressing Goal: Will not experience complications related to urinary retention Outcome: Not Progressing   Problem: Pain Managment: Goal: General experience of comfort will improve Outcome: Not Progressing   Problem: Safety: Goal: Ability to remain free from injury will improve Outcome: Not Progressing   Problem: Skin Integrity: Goal: Risk for impaired skin integrity will decrease Outcome: Not Progressing   Problem: Education: Goal: Understanding of post-operative needs will improve Outcome: Not Progressing Goal: Individualized Educational Video(s) Outcome: Not Progressing   Problem: Clinical Measurements: Goal: Postoperative complications will be avoided or minimized Outcome: Not Progressing   Problem: Respiratory: Goal:  Will regain and/or maintain adequate ventilation Outcome: Not Progressing   Problem: Education: Goal: Knowledge of [place] [facility] Education information/materials will improve Outcome: Not Progressing Goal: Emotional status will improve Outcome: Not Progressing Goal: Mental status will improve Outcome: Not Progressing Goal: Verbalization of understanding the information provided will improve Outcome: Not Progressing   Problem: Activity: Goal: Interest or engagement in activities will improve Outcome: Not Progressing Goal: Sleeping patterns will improve Outcome: Not Progressing   Problem: Coping: Goal: Ability to verbalize frustrations and anger appropriately will improve Outcome: Not Progressing Goal: Ability to demonstrate self-control will improve Outcome: Not Progressing   Problem: Health Behavior/Discharge Planning: Goal: Identification of resources available to assist in meeting health care needs will improve Outcome: Not Progressing Goal: Compliance with treatment plan for underlying cause of condition will improve Outcome: Not Progressing   Problem: Physical Regulation: Goal: Ability to maintain clinical measurements within normal limits will improve Outcome: Not Progressing   Problem: Safety: Goal: Periods of time without injury will increase Outcome: Not Progressing   Problem: Coping: Goal: Coping ability will improve Outcome: Not Progressing Goal: Will verbalize feelings Outcome: Not Progressing   Problem: Self-Concept: Goal: Will verbalize positive feelings about self Outcome: Not Progressing Goal: Level of anxiety will decrease Outcome: Not Progressing   Problem: Self-Concept: Goal: Ability to disclose and discuss suicidal ideas will improve Outcome: Not Progressing

## 2023-02-19 MED ORDER — PROSIGHT PO TABS
1.0000 | ORAL_TABLET | Freq: Every day | ORAL | Status: DC
  Administered 2023-02-19 – 2023-02-22 (×4): 1 via ORAL
  Filled 2023-02-19 (×4): qty 1

## 2023-02-19 MED ORDER — ENSURE ENLIVE PO LIQD
1.0000 | Freq: Two times a day (BID) | ORAL | Status: DC
  Administered 2023-02-19 – 2023-02-22 (×6): 237 mL via ORAL

## 2023-02-19 MED ORDER — ENSURE ENLIVE PO LIQD: 237.0000 mL | Freq: Two times a day (BID) | ORAL | Status: DC

## 2023-02-19 MED ORDER — NICOTINE 21 MG/24HR TD PT24
21.0000 mg | MEDICATED_PATCH | Freq: Every day | TRANSDERMAL | Status: DC
  Administered 2023-02-19 – 2023-02-22 (×4): 21 mg via TRANSDERMAL
  Filled 2023-02-19 (×4): qty 1
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
## 2023-02-19 NOTE — Plan of Care (Signed)
[facility] Tonsillectomy/Adenoidectomy, Postoperative PEDS care plan entered in error.

## 2023-02-19 NOTE — H&P (Signed)
Psychiatric Admission Assessment Adult  Patient Identification: [name] MRN:  [id number] Date of Evaluation:  02/19/2023 Chief Complaint:  MDD (major depressive disorder), recurrent severe, without psychosis (HCC) [F33.2] Principal Diagnosis: MDD (major depressive disorder), recurrent severe, without psychosis (HCC) Diagnosis:  Principal Problem:   MDD (major depressive disorder), recurrent severe, without psychosis (HCC)  History of Present Illness:  Subjective Data:  52 year old African American male, presents with a history of major depressive disorder (MDD), both with and without psychotic features, cannabis use disorder, and a long-standing history of alcohol use disorder (currently in remission). He reports worsening depressive symptoms over the past 1-2 weeks due to various psychosocial stressors, including inconsistent work, financial struggles, difficulties with child support legal issues, and managing chronic back pain.  He presented with worsening depressive symptoms, significant grief related to the deaths of his daughter and father in the past three years, and increased stress from chronic back pain and financial difficulties.The patient has been using cannabis more frequently (almost daily) and expresses concern about self-injurious behavior, stating, "If I keep going down this path, I worry I will cut myself and won't stop." He denies active suicidal ideation with a plan but feels unsafe and seeks inpatient treatment for stabilization. The patient denies current auditory or visual hallucinations, though he has a history of hearing voices during depressive episodes about a year ago. He has not been involved in outpatient mental health services and is currently off psychiatric medications. He is motivated for treatment and expressed a desire to stop using cannabis, though he initially declined Gabapentin due to concerns about sedation but later agreed after an explanation of its  benefits.The patient reported that using marijuana helped him cope with his emotional pain. He initially expressed reluctance to start a mood stabilizer, stating, "I don't know what is in the medication."  Associated Signs/Symptoms: Depression Symptoms:  depressed mood, insomnia, difficulty concentrating, hopelessness, anxiety, loss of energy/fatigue, disturbed sleep, weight loss, decreased appetite, (Hypo) Manic Symptoms:  Impulsivity, Irritable Mood, Labiality of Mood, Anxiety Symptoms:  Excessive Worry, Psychotic Symptoms:   none noted PTSD Symptoms: Negative Total Time spent with patient: 3 hours  Past Psychiatric History: Polysubstance Abuse Depression  Is the patient at risk to self? No.  Has the patient been a risk to self in the past 6 months? No.  Has the patient been 52 a risk to self within the distant past? No.  Is the patient a risk to others? No.  Has the patient been a risk to others in the past 6 months? No.  Has the patient been 52 a risk to others within the distant past? No.   [place] Scale:  Flowsheet Row Admission (Current) from 02/18/2023 in [facility] INPATIENT BEHAVIORAL MEDICINE ED from 02/17/2023 in [facility] Emergency Department at [facility] ED from 01/21/2023 in [facility] Health Urgent Care at [facility] RISK CATEGORY Low Risk High Risk No Risk        Prior Inpatient Therapy: Yes.   If yes, describe In patient rehab  Prior Outpatient Therapy: No. If yes, describe [facility]    Substance Abuse History in the last 12 months:  Yes.   Consequences of Substance Abuse: Withdrawal Symptoms:   Headaches Previous Psychotropic Medications: Yes  Psychological Evaluations: No  Past Medical History:  Past Medical History:  Diagnosis Date   Alcohol abuse    Anemia    Colitis    Severe major depression with psychotic features (HCC)    History reviewed. No pertinent surgical history. Family  History:  Family History  Problem Relation Age of Onset    Hypertension Mother    Hypertension Father    Hypertension Other    Family Psychiatric  History: none noted Tobacco Screening:  Social History   Tobacco Use  Smoking Status Every Day   Current packs/day: 0.25   Types: Cigarettes  Smokeless Tobacco Never    BH Tobacco Counseling     Are you interested in Tobacco Cessation Medications?  Yes, implement Nicotene Replacement Protocol Counseled patient on smoking cessation:  Yes Reason Tobacco Screening Not Completed: No value filed.       Social History:  Social History   Substance and Sexual Activity  Alcohol Use No   Comment: former  last use 5+ year ago     Social History   Substance and Sexual Activity  Drug Use Yes   Frequency: 5.0 times per week   Types: Marijuana   Comment: 10 years 11/03    Additional Social History: Marital status: Single Does patient have children?: Yes How many children?: 4 How is patient's relationship with their children?: Pt reports that he has 2 living children, 2 deceased.  "awesome, my oldest is deceased and the relationship was beautiful, a lot of resentment at not being able to attend the service"                         Allergies:   Allergies  Allergen Reactions   Acetaminophen Nausea Only   Aspirin Hives and Nausea And Vomiting   Ibuprofen Hives and Nausea And Vomiting   Pork-Derived Products Other (See Comments)    Stomach pain. Pt does not eat pork   Sertraline Diarrhea and Other (See Comments)    Acute GI distress with diarrhea and pain.    Lab Results:  Results for orders placed or performed during the hospital encounter of 02/17/23 (from the past 48 hour(s))  Comprehensive metabolic panel     Status: Abnormal   Collection Time: 02/17/23  9:12 PM  Result Value Ref Range   Sodium 139 135 - 145 mmol/L   Potassium 4.6 3.5 - 5.1 mmol/L   Chloride 101 98 - 111 mmol/L   CO2 29 22 - 32 mmol/L   Glucose, Bld 96 70 - 99 mg/dL    Comment: Glucose reference range  applies only to samples taken after fasting for at least 8 hours.   BUN 8 6 - 20 mg/dL   Creatinine, Ser 2.84 0.61 - 1.24 mg/dL   Calcium 9.6 8.9 - 13.2 mg/dL   Total Protein 7.0 6.5 - 8.1 g/dL   Albumin 4.1 3.5 - 5.0 g/dL   AST 16 15 - 41 U/L   ALT 10 0 - 44 U/L   Alkaline Phosphatase 35 (L) 38 - 126 U/L   Total Bilirubin 0.6 0.3 - 1.2 mg/dL   GFR, Estimated >44 >01 mL/min    Comment: (NOTE) Calculated using the CKD-EPI Creatinine Equation (2021)    Anion gap 9 5 - 15    Comment: Performed at [facility] Lab, [address] [place], [place] [address]  Ethanol     Status: None   Collection Time: 02/17/23  9:12 PM  Result Value Ref Range   Alcohol, Ethyl (B) <10 <10 mg/dL    Comment: (NOTE) Lowest detectable limit for serum alcohol is 10 mg/dL.  For medical purposes only. Performed at [facility], [address] [place], [place] [address]  Salicylate level     Status: Abnormal   Collection Time: 02/17/23  9:12 PM  Result Value Ref Range   Salicylate Lvl <7.0 (L) 7.0 - 30.0 mg/dL    Comment: Performed at [facility], [address], [place], [place] [address]  Acetaminophen level     Status: Abnormal   Collection Time: 02/17/23  9:12 PM  Result Value Ref Range   Acetaminophen (Tylenol), Serum <10 (L) 10 - 30 ug/mL    Comment: (NOTE) Therapeutic concentrations vary significantly. A range of 10-30 ug/mL  may be an effective concentration for many patients. However, some  are best treated at concentrations outside of this range. Acetaminophen concentrations >150 ug/mL at 4 hours after ingestion  and >50 ug/mL at 12 hours after ingestion are often associated with  toxic reactions.  Performed at [facility], [address] [place], [place] [address]   CBC     Status: Abnormal   Collection Time: 02/17/23  9:12 PM  Result Value Ref Range   WBC 6.7 4.0 - 10.5 K/uL   RBC 3.97 (L) 4.22 - 5.81 MIL/uL   Hemoglobin 12.1 (L) 13.0 - 17.0 g/dL   HCT 35.5  (L) 73.2 - 52.0 %   MCV 95.7 80.0 - 100.0 fL   MCH 30.5 26.0 - 34.0 pg   MCHC 31.8 30.0 - 36.0 g/dL   RDW 20.2 54.2 - 70.6 %   Platelets 224 150 - 400 K/uL   nRBC 0.0 0.0 - 0.2 %    Comment: Performed at [facility], [address], [place], [place] [address]  Rapid urine drug screen (hospital performed)     Status: Abnormal   Collection Time: 02/18/23 12:56 PM  Result Value Ref Range   Opiates NONE DETECTED NONE DETECTED   Cocaine NONE DETECTED NONE DETECTED   Benzodiazepines NONE DETECTED NONE DETECTED   Amphetamines NONE DETECTED NONE DETECTED   Tetrahydrocannabinol POSITIVE (A) NONE DETECTED   Barbiturates NONE DETECTED NONE DETECTED    Comment: (NOTE) DRUG SCREEN FOR MEDICAL PURPOSES ONLY.  IF CONFIRMATION IS NEEDED FOR ANY PURPOSE, NOTIFY LAB WITHIN 5 DAYS.  LOWEST DETECTABLE LIMITS FOR URINE DRUG SCREEN Drug Class                     Cutoff (ng/mL) Amphetamine and metabolites    1000 Barbiturate and metabolites    200 Benzodiazepine                 200 Opiates and metabolites        300 Cocaine and metabolites        300 THC                            50 Performed at [facility] Lab, [address], [place], [place] [address]     Blood Alcohol level:  Lab Results  Component Value Date   [facility] <10 02/17/2023   ETH <10 09/28/2021    Metabolic Disorder Labs:  Lab Results  Component Value Date   HGBA1C 5.2 08/27/2014   MPG 103 08/27/2014   MPG 105 03/08/2013   No results found for: "PROLACTIN" No results found for: "CHOL", "TRIG", "HDL", "CHOLHDL", "VLDL", "LDLCALC"  Current Medications: Current Facility-Administered Medications  Medication Dose Route Frequency Provider Last Rate Last Admin   acetaminophen (TYLENOL) tablet 650 mg  650 mg Oral Q6H PRN Lenox Ponds, NP  alum & mag hydroxide-simeth (MAALOX/MYLANTA) 200-200-20 MG/5ML suspension 30 mL  30 mL Oral Q4H PRN Lenox Ponds, NP       diphenhydrAMINE (BENADRYL) capsule 50 mg  50  mg Oral TID PRN Lenox Ponds, NP       Or   diphenhydrAMINE (BENADRYL) injection 50 mg  50 mg Intramuscular TID PRN Lenox Ponds, NP       feeding supplement (ENSURE ENLIVE / ENSURE PLUS) liquid 237 mL  1 Bottle Oral BID BM Myriam Forehand, NP   237 mL at 02/19/23 1322   gabapentin (NEURONTIN) capsule 300 mg  300 mg Oral TID Lenox Ponds, NP   300 mg at 02/19/23 1323   haloperidol (HALDOL) tablet 5 mg  5 mg Oral TID PRN Lenox Ponds, NP       Or   haloperidol lactate (HALDOL) injection 5 mg  5 mg Intramuscular TID PRN Lenox Ponds, NP       lidocaine (LIDODERM) 5 % 1 patch  1 patch Transdermal Q24H Lenox Ponds, NP   1 patch at 02/19/23 6295   LORazepam (ATIVAN) tablet 2 mg  2 mg Oral TID PRN Lenox Ponds, NP       Or   LORazepam (ATIVAN) injection 2 mg  2 mg Intramuscular TID PRN Lenox Ponds, NP       magnesium hydroxide (MILK OF MAGNESIA) suspension 30 mL  30 mL Oral Daily PRN Lenox Ponds, NP       mirtazapine (REMERON) tablet 15 mg  15 mg Oral QHS Lenox Ponds, NP       multivitamin (PROSIGHT) tablet 1 tablet  1 tablet Oral Daily Myriam Forehand, NP   1 tablet at 02/19/23 1324   nicotine (NICODERM CQ - dosed in mg/24 hours) patch 21 mg  21 mg Transdermal Q0600 Sarina Ill, DO   21 mg at 02/19/23 2841   traZODone (DESYREL) tablet 50 mg  50 mg Oral QHS PRN Lenox Ponds, NP       PTA Medications: Medications Prior to Admission  Medication Sig Dispense Refill Last Dose   gabapentin (NEURONTIN) 300 MG capsule Take 1 capsule (300 mg total) by mouth 3 (three) times daily. (Patient not taking: Reported on 02/17/2023) 90 capsule 3    methocarbamol (ROBAXIN) 500 MG tablet Take 1 tablet (500 mg total) by mouth at bedtime as needed for muscle spasms. (Patient not taking: Reported on 02/17/2023) 15 tablet 0    nicotine (NICODERM CQ - DOSED IN MG/24 HOURS) 14 mg/24hr patch Place 1 patch (14 mg total) onto the skin daily. (Patient not taking: Reported  on 02/17/2023) 28 patch 3    traMADol (ULTRAM) 50 MG tablet Take 1 tablet (50 mg total) by mouth every 12 (twelve) hours as needed. (Patient not taking: Reported on 02/17/2023) 10 tablet 0     Musculoskeletal: Strength & Muscle Tone: within normal limits Gait & Station: normal Patient leans: N/A            Psychiatric Specialty Exam:  Presentation  General Appearance:  Disheveled  Eye Contact: Good  Speech: Clear and Coherent  Speech Volume: Normal  Handedness: Right   Mood and Affect  Mood: Anxious; Irritable  Affect: Flat   Thought Process  Thought Processes: Coherent  Duration of Psychotic Symptoms:N/A Past Diagnosis of Schizophrenia or Psychoactive disorder: No  Descriptions of Associations:Intact  Orientation:Full (Time, Place and Person) (and situation)  Thought Content:WDL  Hallucinations:Hallucinations:  None  Ideas of Reference:None  Suicidal Thoughts:Suicidal Thoughts: Yes, Passive SI Passive Intent and/or Plan: Without Intent; Without Plan (denies)  Homicidal Thoughts:Homicidal Thoughts: No   Sensorium  Memory: Immediate Good; Remote Fair  Judgment: Fair  Insight: Good   Executive Functions  Concentration: Good  Attention Span: Fair  Recall: Good  Fund of Knowledge: Good  Language: Good   Psychomotor Activity  Psychomotor Activity: Psychomotor Activity: Normal   Assets  Assets: Communication Skills; Housing; Desire for Improvement; Resilience; Social Support   Sleep  Sleep: Sleep: Good Number of Hours of Sleep: 7    Physical Exam: Physical Exam Vitals and nursing note reviewed.  Constitutional:      Appearance: Normal appearance.  HENT:     Head: Normocephalic and atraumatic.     Nose: Nose normal.  Pulmonary:     Effort: Pulmonary effort is normal.  Musculoskeletal:        General: Normal range of motion.     Cervical back: Normal range of motion.  Neurological:     General: No  focal deficit present.     Mental Status: He is alert and oriented to person, place, and time.  Psychiatric:        Attention and Perception: Attention and perception normal.        Mood and Affect: Mood is anxious. Affect is flat.        Speech: Speech normal.        Behavior: Behavior normal. Behavior is cooperative.        Thought Content: Thought content normal.        Cognition and Memory: Cognition and memory normal.        Judgment: Judgment normal.    ROS Blood pressure 114/71, pulse 76, temperature 98.2 F (36.8 C), resp. rate 17, height 5\' 6"  (1.676 m), weight 53.5 kg, SpO2 99%. Body mass index is 19.05 kg/m.  Treatment Plan Summary: Daily contact with patient to assess and evaluate symptoms and progress in treatment and Medication management 1.Start Gabapentin 300mg   to assist with cravings related to cannabis use and provide some relief for chronic back pain. The patient has agreed to this plan after an explanation of its benefits. 2.Consider starting Sertraline (Zoloft) or another SSRI to address depressive symptoms and anxiety. Monitor for any side effects or changes in suicidal ideation. 3. Start Trazodone 50 mg nightly for insomnia, given its efficacy in patients with depressive symptoms as Needed 4.Remeron 15mg  (Mirtazapine) can be considered for the treatment of major depressive disorder (MDD) in this patient, especially given his history of significant depressive symptoms, grief, poor sleep, and appetite changes. Mirtazapine may also help alleviate insomnia and appetite loss, which are common in depression 5.Nicotine replacement therapy: Offer nicotine patches or gum to help reduce cigarette use. 6.Close monitoring for suicidal ideation and any changes in mood or behavior. Regularly assess the patient's safety while inpatient. 7.Referral to [facility] services for additional substance use support if needed. Observation Level/Precautions:  Continuous Observation 1 to 1   Laboratory:   none at this time  Psychotherapy:    Medications:  Gabapentin Remeron  Consultations:    Discharge Concerns:    Estimated LOS:  Other:     Physician Treatment Plan for Primary Diagnosis: MDD (major depressive disorder), recurrent severe, without psychosis (HCC) Long Term Goal(s): Improvement in symptoms so as ready for discharge  Short Term Goals: Ability to identify changes in lifestyle to reduce recurrence of condition will improve, Ability to verbalize feelings will  improve, Ability to disclose and discuss suicidal ideas, Ability to demonstrate self-control will improve, Ability to identify and develop effective coping behaviors will improve, Ability to maintain clinical measurements within normal limits will improve, Compliance with prescribed medications will improve, and Ability to identify triggers associated with substance abuse/mental health issues will improve  Physician Treatment Plan for Secondary Diagnosis: Principal Problem:   MDD (major depressive disorder), recurrent severe, without psychosis (HCC)  Long Term Goal(s): Improvement in symptoms so as ready for discharge  Short Term Goals: Ability to identify changes in lifestyle to reduce recurrence of condition will improve, Ability to verbalize feelings will improve, Ability to disclose and discuss suicidal ideas, Ability to demonstrate self-control will improve, Ability to identify and develop effective coping behaviors will improve, Ability to maintain clinical measurements within normal limits will improve, Compliance with prescribed medications will improve, and Ability to identify triggers associated with substance abuse/mental health issues will improve  I certify that inpatient services furnished can reasonably be expected to improve the patient's condition.    Myriam Forehand, NP 10/18/20244:03 PM

## 2023-02-19 NOTE — Progress Notes (Signed)
   02/19/23 1141  Psych Admission Type (Psych Patients Only)  Admission Status Voluntary  Psychosocial Assessment  Patient Complaints None  Eye Contact Fair  Facial Expression Animated  Affect Blunted;Irritable  Speech Logical/coherent  Interaction Assertive;Minimal  Motor Activity Slow  Appearance/Hygiene Layered clothes  Behavior Characteristics Cooperative;Guarded;Irritable  Mood Irritable  Thought Process  Coherency WDL  Content WDL  Delusions None reported or observed  Perception WDL  Hallucination None reported or observed  Judgment WDL  Confusion None  Danger to Self  Current suicidal ideation? Denies  Danger to Others  Danger to Others None reported or observed

## 2023-02-19 NOTE — [facility] Suicide Risk Assessment (Signed)
[facility] INPATIENT:  Family/Significant Other Suicide Prevention Education  Suicide Prevention Education:  Patient Refusal for Family/Significant Other Suicide Prevention Education: The patient [name] has refused to provide written consent for family/significant other to be provided Family/Significant Other Suicide Prevention Education during admission and/or prior to discharge.  Physician notified.   SPE completed with pt, as pt refused to consent to family contact. SPI pamphlet provided to pt and pt was encouraged to share information with support network, ask questions, and talk about any concerns relating to SPE. Pt denies access to guns/firearms and verbalized understanding of information provided. Mobile Crisis information also provided to pt.   Harden Mo 02/19/2023, 4:10 PM

## 2023-02-19 NOTE — Progress Notes (Signed)
Patient isolative to self and room. Irritable, refused to take mirtazipine. Requested no meds for sleep. Denies SI, HI, AVH. Forwards minimal. Encouragement and support provided. Safety checks maintained. Pt remains safe on unit with q 15 min rounds.

## 2023-02-19 NOTE — Group Note (Signed)
Date:  02/19/2023 Time:  10:31 AM  Group Topic/Focus:  Goals Group:   The focus of this group is to help patients establish daily goals to achieve during treatment and discuss how the patient can incorporate goal setting into their daily lives to aide in recovery.    Participation Level:  Active  Participation Quality:  Appropriate  Affect:  Appropriate  Cognitive:  Alert  Insight: Appropriate  Engagement in Group:  Engaged  Modes of Intervention:  Education  Additional Comments:    Ardelle Anton 02/19/2023, 10:31 AM

## 2023-02-19 NOTE — Group Note (Signed)
Date:  02/19/2023 Time:  8:26 PM  Group Topic/Focus:  Goals Group:   The focus of this group is to help patients establish daily goals to achieve during treatment and discuss how the patient can incorporate goal setting into their daily lives to aide in recovery.    Participation Level:  Active  Participation Quality:  Appropriate and Attentive  Affect:  Appropriate  Cognitive:  Alert and Appropriate  Insight: Appropriate and Good  Engagement in Group:  Developing/Improving and Engaged  Modes of Intervention:  Activity, Discussion, Education, Problem-solving, and Support  Additional Comments:     Rody Keadle 02/19/2023, 8:26 PM

## 2023-02-19 NOTE — [facility] IP Treatment Plan (Signed)
Interdisciplinary Treatment and Diagnostic Plan Update  02/19/2023 Time of Session: 9:30AM Keelan Torrez MRN: [id number]  Principal Diagnosis: MDD (major depressive disorder), recurrent severe, without psychosis (HCC)  Secondary Diagnoses: Principal Problem:   MDD (major depressive disorder), recurrent severe, without psychosis (HCC)   Current Medications:  Current Facility-Administered Medications  Medication Dose Route Frequency Provider Last Rate Last Admin   acetaminophen (TYLENOL) tablet 650 mg  650 mg Oral Q6H PRN Lenox Ponds, NP       alum & mag hydroxide-simeth (MAALOX/MYLANTA) 200-200-20 MG/5ML suspension 30 mL  30 mL Oral Q4H PRN Lenox Ponds, NP       diphenhydrAMINE (BENADRYL) capsule 50 mg  50 mg Oral TID PRN Lenox Ponds, NP       Or   diphenhydrAMINE (BENADRYL) injection 50 mg  50 mg Intramuscular TID PRN Lenox Ponds, NP       feeding supplement (ENSURE ENLIVE / ENSURE PLUS) liquid 237 mL  1 Bottle Oral BID BM Myriam Forehand, NP   237 mL at 02/19/23 1322   gabapentin (NEURONTIN) capsule 300 mg  300 mg Oral TID Lenox Ponds, NP   300 mg at 02/19/23 1323   haloperidol (HALDOL) tablet 5 mg  5 mg Oral TID PRN Lenox Ponds, NP       Or   haloperidol lactate (HALDOL) injection 5 mg  5 mg Intramuscular TID PRN Lenox Ponds, NP       lidocaine (LIDODERM) 5 % 1 patch  1 patch Transdermal Q24H Lenox Ponds, NP   1 patch at 02/19/23 8657   LORazepam (ATIVAN) tablet 2 mg  2 mg Oral TID PRN Lenox Ponds, NP       Or   LORazepam (ATIVAN) injection 2 mg  2 mg Intramuscular TID PRN Lenox Ponds, NP       magnesium hydroxide (MILK OF MAGNESIA) suspension 30 mL  30 mL Oral Daily PRN Lenox Ponds, NP       mirtazapine (REMERON) tablet 15 mg  15 mg Oral QHS Lenox Ponds, NP       multivitamin (PROSIGHT) tablet 1 tablet  1 tablet Oral Daily Myriam Forehand, NP   1 tablet at 02/19/23 1324   nicotine (NICODERM CQ - dosed in mg/24 hours) patch  21 mg  21 mg Transdermal Q0600 Sarina Ill, DO   21 mg at 02/19/23 8469   traZODone (DESYREL) tablet 50 mg  50 mg Oral QHS PRN Lenox Ponds, NP       PTA Medications: Medications Prior to Admission  Medication Sig Dispense Refill Last Dose   gabapentin (NEURONTIN) 300 MG capsule Take 1 capsule (300 mg total) by mouth 3 (three) times daily. (Patient not taking: Reported on 02/17/2023) 90 capsule 3    methocarbamol (ROBAXIN) 500 MG tablet Take 1 tablet (500 mg total) by mouth at bedtime as needed for muscle spasms. (Patient not taking: Reported on 02/17/2023) 15 tablet 0    nicotine (NICODERM CQ - DOSED IN MG/24 HOURS) 14 mg/24hr patch Place 1 patch (14 mg total) onto the skin daily. (Patient not taking: Reported on 02/17/2023) 28 patch 3    traMADol (ULTRAM) 50 MG tablet Take 1 tablet (50 mg total) by mouth every 12 (twelve) hours as needed. (Patient not taking: Reported on 02/17/2023) 10 tablet 0     Patient Stressors: Financial difficulties   Loss of oldest daughter in 2020   Substance abuse  Patient Strengths: Ability for insight  Forensic psychologist fund of knowledge  Motivation for treatment/growth   Treatment Modalities: Medication Management, Group therapy, Case management,  1 to 1 session with clinician, Psychoeducation, Recreational therapy.   Physician Treatment Plan for Primary Diagnosis: MDD (major depressive disorder), recurrent severe, without psychosis (HCC) Long Term Goal(s): Improvement in symptoms so as ready for discharge   Short Term Goals: Ability to identify changes in lifestyle to reduce recurrence of condition will improve Ability to verbalize feelings will improve Ability to disclose and discuss suicidal ideas Ability to demonstrate self-control will improve Ability to identify and develop effective coping behaviors will improve Ability to maintain clinical measurements within normal limits will improve Compliance with prescribed  medications will improve Ability to identify triggers associated with substance abuse/mental health issues will improve  Medication Management: Evaluate patient's response, side effects, and tolerance of medication regimen.  Therapeutic Interventions: 1 to 1 sessions, Unit Group sessions and Medication administration.  Evaluation of Outcomes: Not Met  Physician Treatment Plan for Secondary Diagnosis: Principal Problem:   MDD (major depressive disorder), recurrent severe, without psychosis (HCC)  Long Term Goal(s): Improvement in symptoms so as ready for discharge   Short Term Goals: Ability to identify changes in lifestyle to reduce recurrence of condition will improve Ability to verbalize feelings will improve Ability to disclose and discuss suicidal ideas Ability to demonstrate self-control will improve Ability to identify and develop effective coping behaviors will improve Ability to maintain clinical measurements within normal limits will improve Compliance with prescribed medications will improve Ability to identify triggers associated with substance abuse/mental health issues will improve     Medication Management: Evaluate patient's response, side effects, and tolerance of medication regimen.  Therapeutic Interventions: 1 to 1 sessions, Unit Group sessions and Medication administration.  Evaluation of Outcomes: Not Met   RN Treatment Plan for Primary Diagnosis: MDD (major depressive disorder), recurrent severe, without psychosis (HCC) Long Term Goal(s): Knowledge of disease and therapeutic regimen to maintain health will improve  Short Term Goals: Ability to demonstrate self-control, Ability to participate in decision making will improve, Ability to verbalize feelings will improve, Ability to disclose and discuss suicidal ideas, and Ability to identify and develop effective coping behaviors will improve  Medication Management: RN will administer medications as ordered by  provider, will assess and evaluate patient's response and provide education to patient for prescribed medication. RN will report any adverse and/or side effects to prescribing provider.  Therapeutic Interventions: 1 on 1 counseling sessions, Psychoeducation, Medication administration, Evaluate responses to treatment, Monitor vital signs and CBGs as ordered, Perform/monitor CIWA, COWS, AIMS and Fall Risk screenings as ordered, Perform wound care treatments as ordered.  Evaluation of Outcomes: Not Met   LCSW Treatment Plan for Primary Diagnosis: MDD (major depressive disorder), recurrent severe, without psychosis (HCC) Long Term Goal(s): Safe transition to appropriate next level of care at discharge, Engage patient in therapeutic group addressing interpersonal concerns.  Short Term Goals: Engage patient in aftercare planning with referrals and resources, Increase social support, Increase ability to appropriately verbalize feelings, Increase emotional regulation, Facilitate acceptance of mental health diagnosis and concerns, Facilitate patient progression through stages of change regarding substance use diagnoses and concerns, Identify triggers associated with mental health/substance abuse issues, and Increase skills for wellness and recovery  Therapeutic Interventions: Assess for all discharge needs, 1 to 1 time with Social worker, Explore available resources and support systems, Assess for adequacy in community support network, Educate family and significant other(s) on  suicide prevention, Complete Psychosocial Assessment, Interpersonal group therapy.  Evaluation of Outcomes: Not Met   Progress in Treatment: Attending groups: Yes. Participating in groups: Yes. Taking medication as prescribed: Yes. Toleration medication: Yes. Family/Significant other contact made: No, will contact:  once permission has been given. Patient understands diagnosis: Yes. Discussing patient identified problems/goals  with staff: Yes. Medical problems stabilized or resolved: Yes. Denies suicidal/homicidal ideation: Yes. Issues/concerns per patient self-inventory: No. Other: none  New problem(s) identified: No, Describe:  none  New Short Term/Long Term Goal(s): detox, elimination of symptoms of psychosis, medication management for mood stabilization; elimination of SI thoughts; development of comprehensive mental wellness/sobriety plan.   Patient Goals:  "do what I got to, participate in groups and gain as much as I can while I am here"  Discharge Plan or Barriers: CSW to assist in the development of appropriate discharge plans.   Reason for Continuation of Hospitalization: Anxiety Depression Medication stabilization Suicidal ideation  Estimated Length of Stay: 1-7 days  Last 3 [place] Suicide Severity Risk Score: Flowsheet Row Admission (Current) from 02/18/2023 in [facility] ED from 02/17/2023 in [facility] Emergency Department at [facility] ED from 01/21/2023 in [facility] at [facility] RISK CATEGORY Low Risk High Risk No Risk       Last PHQ 2/9 Scores:    10/14/2021    8:22 AM 09/29/2021    4:03 AM 09/03/2014    2:22 PM  Depression screen PHQ 2/9  Decreased Interest 1 2 0  Down, Depressed, Hopeless 3 3 0  PHQ - 2 Score 4 5 0  Altered sleeping 2 2   Tired, decreased energy 2 2   Change in appetite 1 2   Feeling bad or failure about yourself  3 3   Trouble concentrating 1 2   Moving slowly or fidgety/restless 0 1   Suicidal thoughts 1 2   PHQ-9 Score 14 19   Difficult doing work/chores Somewhat difficult Very difficult     Scribe for Treatment Team: Harden Mo, LCSW 02/19/2023 4:20 PM

## 2023-02-19 NOTE — Group Note (Signed)
Date:  02/19/2023 Time:  5:18 PM  Group Topic/Focus:  Making Healthy Choices:   The focus of this group is to help patients identify negative/unhealthy choices they were using prior to admission and identify positive/healthier coping strategies to replace them upon discharge. Outdoor Recreation/Activity    Participation Level:  Did Not Attend   Ardelle Anton 02/19/2023, 5:18 PM

## 2023-02-19 NOTE — Plan of Care (Signed)
  Problem: Education: Goal: Knowledge of General Education information will improve Description: Including pain rating scale, medication(s)/side effects and non-pharmacologic comfort measures Outcome: Progressing   Problem: Safety: Goal: Periods of time without injury will increase Outcome: Progressing

## 2023-02-19 NOTE — [facility] Suicide Risk Assessment (Signed)
[facility] Admission Suicide Risk Assessment   Nursing information obtained from:  Patient Demographic factors:  Male, Living alone, Low socioeconomic status Current Mental Status:  Self-harm thoughts Loss Factors:  Loss of significant relationship, Financial problems / change in socioeconomic status Historical Factors:  NA Risk Reduction Factors:  Sense of responsibility to family  Total Time spent with patient: 2 hours Principal Problem: MDD (major depressive disorder), recurrent severe, without psychosis (HCC) Diagnosis:  Principal Problem:   MDD (major depressive disorder), recurrent severe, without psychosis (HCC)  Subjective Data:  52 year old [place] American male, presents with a history of major depressive disorder (MDD), both with and without psychotic features, cannabis use disorder, and a long-standing history of alcohol use disorder (currently in remission). He reports worsening depressive symptoms over the past 1-2 weeks due to various psychosocial stressors, including inconsistent work, financial struggles, difficulties with child support legal issues, and managing chronic back pain.  He presented with worsening depressive symptoms, significant grief related to the deaths of his daughter and father in the past three years, and increased stress from chronic back pain and financial difficulties.The patient has been using cannabis more frequently (almost daily) and expresses concern about self-injurious behavior, stating, "If I keep going down this path, I worry I will cut myself and won't stop." He denies active suicidal ideation with a plan but feels unsafe and seeks inpatient treatment for stabilization. The patient denies current auditory or visual hallucinations, though he has a history of hearing voices during depressive episodes about a year ago. He has not been involved in outpatient mental health services and is currently off psychiatric medications. He is motivated for treatment and  expressed a desire to stop using cannabis, though he initially declined Gabapentin due to concerns about sedation but later agreed after an explanation of its benefits.The patient reported that using marijuana helped him cope with his emotional pain. He initially expressed reluctance to start a mood stabilizer, stating, "I don't know what is in the medication."   CLINICAL FACTORS:   Depression:   Comorbid alcohol abuse/dependence Impulsivity Alcohol/Substance Abuse/Dependencies Previous Psychiatric Diagnoses and Treatments   Musculoskeletal: Strength & Muscle Tone: within normal limits Gait & Station: normal Patient leans: N/A  Psychiatric Specialty Exam:  Presentation  General Appearance:  Disheveled  Eye Contact: Good  Speech: Clear and Coherent  Speech Volume: Normal  Handedness: Right   Mood and Affect  Mood: Anxious; Irritable  Affect: Flat   Thought Process  Thought Processes: Coherent  Descriptions of Associations:Intact  Orientation:Full (Time, Place and Person) (and situation)  Thought Content:WDL  History of Schizophrenia/Schizoaffective disorder:No  Duration of Psychotic Symptoms:No data recorded Hallucinations:Hallucinations: None  Ideas of Reference:None  Suicidal Thoughts:Suicidal Thoughts: Yes, Passive SI Passive Intent and/or Plan: Without Intent; Without Plan (denies)  Homicidal Thoughts:Homicidal Thoughts: No   Sensorium  Memory: Immediate Good; Remote Fair  Judgment: Fair  Insight: Good   Executive Functions  Concentration: Good  Attention Span: Fair  Recall: Good  Fund of Knowledge: Good  Language: Good   Psychomotor Activity  Psychomotor Activity: Psychomotor Activity: Normal   Assets  Assets: Communication Skills; Housing; Desire for Improvement; Resilience; Social Support   Sleep  Sleep: Sleep: Good Number of Hours of Sleep: 7    Physical Exam: Physical Exam Constitutional:       Appearance: Normal appearance.  HENT:     Head: Normocephalic and atraumatic.     Nose: Nose normal.  Pulmonary:     Effort: Pulmonary effort is normal.  Musculoskeletal:  General: Normal range of motion.     Cervical back: Normal range of motion.  Neurological:     General: No focal deficit present.     Mental Status: He is oriented to person, place, and time. Mental status is at baseline.  Psychiatric:        Attention and Perception: Attention and perception normal.        Mood and Affect: Mood is anxious. Affect is flat.        Speech: Speech normal.        Behavior: Behavior normal. Behavior is cooperative.        Thought Content: Thought content normal.        Cognition and Memory: Cognition and memory normal.        Judgment: Judgment normal.    ROS Blood pressure 114/71, pulse 76, temperature 98.2 F (36.8 C), resp. rate 17, height 5\' 6"  (1.676 m), weight 53.5 kg, SpO2 99%. Body mass index is 19.05 kg/m.   COGNITIVE FEATURES THAT CONTRIBUTE TO RISK:  None    SUICIDE RISK:   Mild:  Suicidal ideation of limited frequency, intensity, duration, and specificity.  There are no identifiable plans, no associated intent, mild dysphoria and related symptoms, good self-control (both objective and subjective assessment), few other risk factors, and identifiable protective factors, including available and accessible social support.  PLAN OF CARE:  1.Start Gabapentin 300mg   to assist with cravings related to cannabis use and provide some relief for chronic back pain. The patient has agreed to this plan after an explanation of its benefits. 2.Consider starting Sertraline (Zoloft) or another SSRI to address depressive symptoms and anxiety. Monitor for any side effects or changes in suicidal ideation. 3. Start Trazodone 50 mg nightly for insomnia, given its efficacy in patients with depressive symptoms as Needed 4.Remeron 15mg  (Mirtazapine) can be considered for the treatment of  major depressive disorder (MDD) in this patient, especially given his history of significant depressive symptoms, grief, poor sleep, and appetite changes. Mirtazapine may also help alleviate insomnia and appetite loss, which are common in depression 5.Nicotine replacement therapy: Offer nicotine patches or gum to help reduce cigarette use. 6.Close monitoring for suicidal ideation and any changes in mood or behavior. Regularly assess the patient's safety while inpatient. 7.Referral to [facility] services for additional substance use support if needed.   I certify that inpatient services furnished can reasonably be expected to improve the patient's condition.   Myriam Forehand, NP 02/19/2023, 12:25 PM

## 2023-02-20 NOTE — Progress Notes (Signed)
[facility] MD Progress Note  02/20/2023 5:41 PM [name]  MRN:  [id number] Subjective:  *** Principal Problem: MDD (major depressive disorder), recurrent severe, without psychosis (HCC) Diagnosis: Principal Problem:   MDD (major depressive disorder), recurrent severe, without psychosis (HCC)  Total Time spent with patient: {Time; 15 min - 8 hours:17441}  Past Psychiatric History: ***  Past Medical History:  Past Medical History:  Diagnosis Date   Alcohol abuse    Anemia    Colitis    Severe major depression with psychotic features (HCC)    History reviewed. No pertinent surgical history. Family History:  Family History  Problem Relation Age of Onset   Hypertension Mother    Hypertension Father    Hypertension Other    Family Psychiatric  History: *** Social History:  Social History   Substance and Sexual Activity  Alcohol Use No   Comment: former  last use 5+ year ago     Social History   Substance and Sexual Activity  Drug Use Yes   Frequency: 5.0 times per week   Types: Marijuana   Comment: 10 years 11/03    Social History   Socioeconomic History   Marital status: Single    Spouse name: Not on file   Number of children: Not on file   Years of education: Not on file   Highest education level: Not on file  Occupational History   Not on file  Tobacco Use   Smoking status: Every Day    Current packs/day: 0.25    Types: Cigarettes   Smokeless tobacco: Never  Vaping Use   Vaping status: Never Used  Substance and Sexual Activity   Alcohol use: No    Comment: former  last use 5+ year ago   Drug use: Yes    Frequency: 5.0 times per week    Types: Marijuana    Comment: 10 years 11/03   Sexual activity: Yes    Birth control/protection: Condom  Other Topics Concern   Not on file  Social History Narrative   Not on file   Social Determinants of Health   Financial Resource Strain: High Risk (03/19/2019)   Overall Financial Resource Strain (CARDIA)     Difficulty of Paying Living Expenses: Very hard  Food Insecurity: Food Insecurity Present (02/18/2023)   Hunger Vital Sign    Worried About Running Out of Food in the Last Year: Sometimes true    Ran Out of Food in the Last Year: Sometimes true  Transportation Needs: No Transportation Needs (02/18/2023)   PRAPARE - Administrator, Civil Service (Medical): No    Lack of Transportation (Non-Medical): No  Physical Activity: Not on file  Stress: Not on file  Social Connections: Not on file   Additional Social History:                         Sleep: {[facility] GOOD/FAIR/POOR:22877}  Appetite:  {[facility] GOOD/FAIR/POOR:22877}  Current Medications: Current Facility-Administered Medications  Medication Dose Route Frequency Provider Last Rate Last Admin   acetaminophen (TYLENOL) tablet 650 mg  650 mg Oral Q6H PRN Lenox Ponds, NP       alum & mag hydroxide-simeth (MAALOX/MYLANTA) 200-200-20 MG/5ML suspension 30 mL  30 mL Oral Q4H PRN Lenox Ponds, NP       diphenhydrAMINE (BENADRYL) capsule 50 mg  50 mg Oral TID PRN Lenox Ponds, NP       Or   diphenhydrAMINE (BENADRYL)  injection 50 mg  50 mg Intramuscular TID PRN Lenox Ponds, NP       feeding supplement (ENSURE ENLIVE / ENSURE PLUS) liquid 237 mL  1 Bottle Oral BID BM Myriam Forehand, NP   237 mL at 02/20/23 1250   gabapentin (NEURONTIN) capsule 300 mg  300 mg Oral TID Lenox Ponds, NP   300 mg at 02/20/23 1727   haloperidol (HALDOL) tablet 5 mg  5 mg Oral TID PRN Lenox Ponds, NP       Or   haloperidol lactate (HALDOL) injection 5 mg  5 mg Intramuscular TID PRN Lenox Ponds, NP       lidocaine (LIDODERM) 5 % 1 patch  1 patch Transdermal Q24H Lenox Ponds, NP   1 patch at 02/20/23 0910   LORazepam (ATIVAN) tablet 2 mg  2 mg Oral TID PRN Lenox Ponds, NP       Or   LORazepam (ATIVAN) injection 2 mg  2 mg Intramuscular TID PRN Lenox Ponds, NP       magnesium hydroxide (MILK OF MAGNESIA)  suspension 30 mL  30 mL Oral Daily PRN Lenox Ponds, NP       mirtazapine (REMERON) tablet 15 mg  15 mg Oral QHS Lenox Ponds, NP   15 mg at 02/19/23 2133   multivitamin (PROSIGHT) tablet 1 tablet  1 tablet Oral Daily Myriam Forehand, NP   1 tablet at 02/20/23 2355   nicotine (NICODERM CQ - dosed in mg/24 hours) patch 21 mg  21 mg Transdermal Q0600 Sarina Ill, DO   21 mg at 02/20/23 7322   traZODone (DESYREL) tablet 50 mg  50 mg Oral QHS PRN Lenox Ponds, NP   50 mg at 02/19/23 2302    Lab Results: No results found for this or any previous visit (from the past 48 hour(s)).  Blood Alcohol level:  Lab Results  Component Value Date   ETH <10 02/17/2023   ETH <10 09/28/2021    Metabolic Disorder Labs: Lab Results  Component Value Date   HGBA1C 5.2 08/27/2014   MPG 103 08/27/2014   MPG 105 03/08/2013   No results found for: "PROLACTIN" No results found for: "CHOL", "TRIG", "HDL", "CHOLHDL", "VLDL", "LDLCALC"  Physical Findings: AIMS:  , ,  ,  ,    CIWA:    COWS:     Musculoskeletal: Strength & Muscle Tone: {desc; muscle tone:32375} Gait & Station: {PE GAIT ED GURK:27062} Patient leans: {Patient Leans:21022755}  Psychiatric Specialty Exam:  Presentation  General Appearance:  Disheveled  Eye Contact: Good  Speech: Clear and Coherent  Speech Volume: Normal  Handedness: Right   Mood and Affect  Mood: Anxious; Irritable  Affect: Flat   Thought Process  Thought Processes: Coherent  Descriptions of Associations:Intact  Orientation:Full (Time, Place and Person) (and situation)  Thought Content:WDL  History of Schizophrenia/Schizoaffective disorder:No  Duration of Psychotic Symptoms:No data recorded Hallucinations:Hallucinations: None  Ideas of Reference:None  Suicidal Thoughts:Suicidal Thoughts: Yes, Passive SI Passive Intent and/or Plan: Without Intent; Without Plan (denies)  Homicidal Thoughts:Homicidal Thoughts:  No   Sensorium  Memory: Immediate Good; Remote Fair  Judgment: Fair  Insight: Good   Executive Functions  Concentration: Good  Attention Span: Fair  Recall: Good  Fund of Knowledge: Good  Language: Good   Psychomotor Activity  Psychomotor Activity: Psychomotor Activity: Normal   Assets  Assets: Communication Skills; Housing; Desire for Improvement; Resilience; Social Support   Sleep  Sleep: Sleep: Good Number of Hours of Sleep: 7    Physical Exam: Physical Exam Vitals and nursing note reviewed.  Psychiatric:        Attention and Perception: Attention and perception normal.        Mood and Affect: Mood is anxious. Affect is blunt and flat.        Speech: Speech normal.        Behavior: Behavior is uncooperative.        Thought Content: Thought content normal.        Cognition and Memory: Cognition and memory normal.        Judgment: Judgment normal.    ROS Blood pressure 129/88, pulse 75, temperature (!) 97.2 F (36.2 C), resp. rate 18, height 5\' 6"  (1.676 m), weight 53.5 kg, SpO2 100%. Body mass index is 19.05 kg/m.   Treatment Plan Summary: {CHL [facility] MD [place]. NUUV:[id number]}  Myriam Forehand, NP 02/20/2023, 5:41 PM

## 2023-02-20 NOTE — Progress Notes (Signed)
D- Patient alert and oriented x 4. Affect animated/mood guarded. Denies SI/ HI/ AVH. Patient complains of back pain 10/10 but request no PRN medications.  Lidocaine patch ordered and applied. Patient endorses depression and anxiety. His goal is to "stay humble and remembering me that I can do all thing through [facility] who strengthens me".  A- Scheduled medications administered to patient, per MD orders. Support and encouragement provided.  Routine safety checks conducted every 15 minutes without incident.  Patient informed to notify staff with problems or concerns and verbalizes understanding. R- No adverse drug reactions noted.  Patient compliant with medications and treatment plan. Patient is receptive, calm and cooperative.  Patient contracts for safety and  remains safe on the unit at this time.

## 2023-02-20 NOTE — Plan of Care (Signed)
  Problem: Education: Goal: Knowledge of General Education information will improve Description: Including pain rating scale, medication(s)/side effects and non-pharmacologic comfort measures Outcome: Progressing   Problem: Health Behavior/Discharge Planning: Goal: Ability to manage health-related needs will improve Outcome: Progressing   Problem: Clinical Measurements: Goal: Ability to maintain clinical measurements within normal limits will improve Outcome: Progressing Goal: Will remain free from infection Outcome: Progressing Goal: Diagnostic test results will improve Outcome: Progressing Goal: Respiratory complications will improve Outcome: Progressing Goal: Cardiovascular complication will be avoided Outcome: Progressing   Problem: Activity: Goal: Risk for activity intolerance will decrease Outcome: Progressing   Problem: Nutrition: Goal: Adequate nutrition will be maintained Outcome: Progressing   Problem: Coping: Goal: Level of anxiety will decrease Outcome: Progressing   Problem: Elimination: Goal: Will not experience complications related to bowel motility Outcome: Progressing Goal: Will not experience complications related to urinary retention Outcome: Progressing   Problem: Pain Managment: Goal: General experience of comfort will improve Outcome: Progressing   Problem: Safety: Goal: Ability to remain free from injury will improve Outcome: Progressing   Problem: Skin Integrity: Goal: Risk for impaired skin integrity will decrease Outcome: Progressing   Problem: Education: Goal: Knowledge of [place] [facility] Education information/materials will improve Outcome: Progressing Goal: Emotional status will improve Outcome: Progressing Goal: Mental status will improve Outcome: Progressing Goal: Verbalization of understanding the information provided will improve Outcome: Progressing   Problem: Activity: Goal: Interest or engagement in activities will  improve Outcome: Progressing Goal: Sleeping patterns will improve Outcome: Progressing   Problem: Coping: Goal: Ability to verbalize frustrations and anger appropriately will improve Outcome: Progressing Goal: Ability to demonstrate self-control will improve Outcome: Progressing   Problem: Health Behavior/Discharge Planning: Goal: Identification of resources available to assist in meeting health care needs will improve Outcome: Progressing Goal: Compliance with treatment plan for underlying cause of condition will improve Outcome: Progressing   Problem: Physical Regulation: Goal: Ability to maintain clinical measurements within normal limits will improve Outcome: Progressing   Problem: Safety: Goal: Periods of time without injury will increase Outcome: Progressing   Problem: Coping: Goal: Coping ability will improve Outcome: Progressing Goal: Will verbalize feelings Outcome: Progressing   Problem: Self-Concept: Goal: Will verbalize positive feelings about self Outcome: Progressing Goal: Level of anxiety will decrease Outcome: Progressing   Problem: Self-Concept: Goal: Ability to disclose and discuss suicidal ideas will improve Outcome: Progressing Goal: Will verbalize positive feelings about self Outcome: Progressing

## 2023-02-20 NOTE — [facility] Group Notes (Signed)
[facility] Group Notes:  (Nursing/MHT/Case Management/Adjunct) dult Psychoeducational Group Note  Date:  02/20/2023 Time:  11:30 AM  Group Topic/Focus:  Emotional Education:   The focus of this group is to discuss what feelings/emotions are, and how they are experienced. Goals Group:   The focus of this group is to help patients establish daily goals to achieve during treatment and discuss how the patient can incorporate goal setting into their daily lives to aide in recovery.  Participation Level:  Active  Participation Quality:  Appropriate  Affect:  Appropriate  Cognitive:  Appropriate  Insight: Good  Engagement in Group:  Engaged  Modes of Intervention:  Discussion and Education  Additional Comments:    [name] 02/20/2023, 11:30 AM

## 2023-02-20 NOTE — Progress Notes (Signed)
   Pt is alert and oriented. Pt attended  groups, she pleasant and  sociable with staff and peers.  She did not report anxiety, depression, SI/HI or AVH at this time. Pt reported a chronic back pain which she rated at 7/10.    Pt received her medication per provider order.  she was calm and cooperative. [id number] Checks are being done by MHT. Appropriate support being provided.    Will continue to monitor pt per orders

## 2023-02-20 NOTE — Group Note (Signed)
Date:  02/20/2023 Time:  5:10 PM  Group Topic/Focus:  Healthy Communication:   The focus of this group is to discuss communication, barriers to communication, as well as healthy ways to communicate with others. Making Healthy Choices:   The focus of this group is to help patients identify negative/unhealthy choices they were using prior to admission and identify positive/healthier coping strategies to replace them upon discharge.    Participation Level:  Active  Participation Quality:  Appropriate  Affect:  Appropriate  Cognitive:  Appropriate  Insight: Appropriate  Engagement in Group:  Engaged  Modes of Intervention:  Activity  Additional Comments:    Lynelle Smoke Merced Hanners 02/20/2023, 5:10 PM

## 2023-02-20 NOTE — Group Note (Signed)
Date:  02/20/2023 Time:  8:35 PM  Group Topic/Focus:  Rediscovering Joy:   The focus of this group is to explore various ways to relieve stress in a positive manner.    Participation Level:  Active  Participation Quality:  Appropriate and Attentive  Affect:  Appropriate  Cognitive:  Alert and Appropriate  Insight: Appropriate, Good, and Improving  Engagement in Group:  Developing/Improving  Modes of Intervention:  Activity, Exploration, and Rapport Building  Additional Comments:     Shaquandra Galano 02/20/2023, 8:35 PM

## 2023-02-20 NOTE — Plan of Care (Signed)
  Problem: Education: Goal: Knowledge of General Education information will improve Description: Including pain rating scale, medication(s)/side effects and non-pharmacologic comfort measures Outcome: Not Progressing   Problem: Health Behavior/Discharge Planning: Goal: Ability to manage health-related needs will improve Outcome: Not Progressing   Problem: Clinical Measurements: Goal: Ability to maintain clinical measurements within normal limits will improve Outcome: Not Progressing Goal: Will remain free from infection Outcome: Not Progressing Goal: Diagnostic test results will improve Outcome: Not Progressing Goal: Respiratory complications will improve Outcome: Not Progressing Goal: Cardiovascular complication will be avoided Outcome: Not Progressing   Problem: Activity: Goal: Risk for activity intolerance will decrease Outcome: Not Progressing   Problem: Nutrition: Goal: Adequate nutrition will be maintained Outcome: Not Progressing   Problem: Coping: Goal: Level of anxiety will decrease Outcome: Not Progressing   Problem: Elimination: Goal: Will not experience complications related to bowel motility Outcome: Not Progressing Goal: Will not experience complications related to urinary retention Outcome: Not Progressing   Problem: Pain Managment: Goal: General experience of comfort will improve Outcome: Not Progressing   Problem: Safety: Goal: Ability to remain free from injury will improve Outcome: Not Progressing   Problem: Skin Integrity: Goal: Risk for impaired skin integrity will decrease Outcome: Not Progressing   Problem: Education: Goal: Knowledge of [place] General Education information/materials will improve Outcome: Not Progressing Goal: Emotional status will improve Outcome: Not Progressing Goal: Mental status will improve Outcome: Not Progressing Goal: Verbalization of understanding the information provided will improve Outcome: Not  Progressing   Problem: Activity: Goal: Interest or engagement in activities will improve Outcome: Not Progressing Goal: Sleeping patterns will improve Outcome: Not Progressing   Problem: Coping: Goal: Ability to verbalize frustrations and anger appropriately will improve Outcome: Not Progressing Goal: Ability to demonstrate self-control will improve Outcome: Not Progressing   Problem: Health Behavior/Discharge Planning: Goal: Identification of resources available to assist in meeting health care needs will improve Outcome: Not Progressing Goal: Compliance with treatment plan for underlying cause of condition will improve Outcome: Not Progressing   Problem: Physical Regulation: Goal: Ability to maintain clinical measurements within normal limits will improve Outcome: Not Progressing   Problem: Safety: Goal: Periods of time without injury will increase Outcome: Not Progressing   Problem: Coping: Goal: Coping ability will improve Outcome: Not Progressing Goal: Will verbalize feelings Outcome: Not Progressing   Problem: Self-Concept: Goal: Will verbalize positive feelings about self Outcome: Not Progressing Goal: Level of anxiety will decrease Outcome: Not Progressing   Problem: Self-Concept: Goal: Ability to disclose and discuss suicidal ideas will improve Outcome: Not Progressing Goal: Will verbalize positive feelings about self Outcome: Not Progressing Note:

## 2023-02-21 NOTE — Group Note (Signed)
Date:  02/21/2023 Time:  10:43 PM  Group Topic/Focus:  Wrap-Up Group:   The focus of this group is to help patients review their daily goal of treatment and discuss progress on daily workbooks.    Participation Level:  Active  Participation Quality:  Appropriate, Attentive, Sharing, and Supportive  Affect:  Appropriate  Cognitive:  Appropriate  Insight: Appropriate and Good  Engagement in Group:  Engaged and Supportive  Modes of Intervention:  Discussion and Support  Additional Comments:     Belva Crome 02/21/2023, 10:43 PM

## 2023-02-21 NOTE — Progress Notes (Signed)
Pt is alert and oriented at this time.He has denies anxiety, depression, SI/HI/AVH. Pt denies pain at time.   Pt received scheduled medications as per MD orders. Support and encouragement provided frequent verbal contracts made. Routine safety checks conducted q15 mins.   No adverse drug reactions noted. Pt verbally contracts for safety at  this time. Pt is compliant with medications and treatment plan . Pt interacts with others on the unit. Pt remains safe at this time, plan of care ongoing.

## 2023-02-21 NOTE — Progress Notes (Signed)
[name] MD Progress Note  02/21/2023 10:02 AM [name]  MRN:  [id number] Subjective:  a 52 year old African American male, reports feeling better and has agreed to stay in the hospital until Monday. He is actively participating in group activities and is compliant with his medication regimen. The patient denies any suicidal ideation (SI) or homicidal ideation (HI).teracting appropriately with staff and other patientsObserved watching a game in the dayroom, demonstrating social engagementMood appears improved, with affect appropriate to the situationSpeech is normal in rate and tone; thought process is linear and goal-directed.Denies SI, HI, or experiencing auditory/visual hallucinations Principal Problem: MDD (major depressive disorder), recurrent severe, without psychosis (HCC) Diagnosis: Principal Problem:   MDD (major depressive disorder), recurrent severe, without psychosis (HCC)  Total Time spent with patient: 45 minutes  Past Psychiatric History: Alcohol Abuse Cannabis Abuse, Depression  Past Medical History:  Past Medical History:  Diagnosis Date   Alcohol abuse    Anemia    Colitis    Severe major depression with psychotic features (HCC)    History reviewed. No pertinent surgical history. Family History:  Family History  Problem Relation Age of Onset   Hypertension Mother    Hypertension Father    Hypertension Other    Family Psychiatric  History: see above Social History:  Social History   Substance and Sexual Activity  Alcohol Use No   Comment: former  last use 5+ year ago     Social History   Substance and Sexual Activity  Drug Use Yes   Frequency: 5.0 times per week   Types: Marijuana   Comment: 10 years 11/03    Social History   Socioeconomic History   Marital status: Single    Spouse name: Not on file   Number of children: Not on file   Years of education: Not on file   Highest education level: Not on file  Occupational History   Not on file   Tobacco Use   Smoking status: Every Day    Current packs/day: 0.25    Types: Cigarettes   Smokeless tobacco: Never  Vaping Use   Vaping status: Never Used  Substance and Sexual Activity   Alcohol use: No    Comment: former  last use 5+ year ago   Drug use: Yes    Frequency: 5.0 times per week    Types: Marijuana    Comment: 10 years 11/03   Sexual activity: Yes    Birth control/protection: Condom  Other Topics Concern   Not on file  Social History Narrative   Not on file   Social Determinants of Health   Financial Resource Strain: High Risk (03/19/2019)   Overall Financial Resource Strain (CARDIA)    Difficulty of Paying Living Expenses: Very hard  Food Insecurity: Food Insecurity Present (02/18/2023)   Hunger Vital Sign    Worried About Running Out of Food in the Last Year: Sometimes true    Ran Out of Food in the Last Year: Sometimes true  Transportation Needs: No Transportation Needs (02/18/2023)   PRAPARE - Administrator, Civil Service (Medical): No    Lack of Transportation (Non-Medical): No  Physical Activity: Not on file  Stress: Not on file  Social Connections: Not on file   Additional Social History:                         Sleep: Negative  Appetite:  Negative  Current Medications: Current Facility-Administered Medications  Medication Dose  Route Frequency Provider Last Rate Last Admin   acetaminophen (TYLENOL) tablet 650 mg  650 mg Oral Q6H PRN Lenox Ponds, NP       alum & mag hydroxide-simeth (MAALOX/MYLANTA) 200-200-20 MG/5ML suspension 30 mL  30 mL Oral Q4H PRN Lenox Ponds, NP       diphenhydrAMINE (BENADRYL) capsule 50 mg  50 mg Oral TID PRN Lenox Ponds, NP       Or   diphenhydrAMINE (BENADRYL) injection 50 mg  50 mg Intramuscular TID PRN Lenox Ponds, NP       feeding supplement (ENSURE ENLIVE / ENSURE PLUS) liquid 237 mL  1 Bottle Oral BID BM Myriam Forehand, NP   237 mL at 02/21/23 1500   gabapentin  (NEURONTIN) capsule 300 mg  300 mg Oral TID Lenox Ponds, NP   300 mg at 02/21/23 1742   haloperidol (HALDOL) tablet 5 mg  5 mg Oral TID PRN Lenox Ponds, NP       Or   haloperidol lactate (HALDOL) injection 5 mg  5 mg Intramuscular TID PRN Lenox Ponds, NP       lidocaine (LIDODERM) 5 % 1 patch  1 patch Transdermal Q24H Lenox Ponds, NP   1 patch at 02/21/23 0846   LORazepam (ATIVAN) tablet 2 mg  2 mg Oral TID PRN Lenox Ponds, NP       Or   LORazepam (ATIVAN) injection 2 mg  2 mg Intramuscular TID PRN Lenox Ponds, NP       magnesium hydroxide (MILK OF MAGNESIA) suspension 30 mL  30 mL Oral Daily PRN Lenox Ponds, NP       mirtazapine (REMERON) tablet 15 mg  15 mg Oral QHS Lenox Ponds, NP   15 mg at 02/20/23 2131   multivitamin (PROSIGHT) tablet 1 tablet  1 tablet Oral Daily Myriam Forehand, NP   1 tablet at 02/21/23 0846   nicotine (NICODERM CQ - dosed in mg/24 hours) patch 21 mg  21 mg Transdermal Q0600 Sarina Ill, DO   21 mg at 02/21/23 0846   traZODone (DESYREL) tablet 50 mg  50 mg Oral QHS PRN Lenox Ponds, NP   50 mg at 02/19/23 2302    Lab Results: No results found for this or any previous visit (from the past 48 hour(s)).  Blood Alcohol level:  Lab Results  Component Value Date   ETH <10 02/17/2023   ETH <10 09/28/2021    Metabolic Disorder Labs: Lab Results  Component Value Date   HGBA1C 5.2 08/27/2014   MPG 103 08/27/2014   MPG 105 03/08/2013   No results found for: "PROLACTIN" No results found for: "CHOL", "TRIG", "HDL", "CHOLHDL", "VLDL", "LDLCALC"  Physical Findings: AIMS:  , ,  ,  ,    CIWA:    COWS:     Musculoskeletal: Strength & Muscle Tone: within normal limits Gait & Station: normal Patient leans: N/A  Psychiatric Specialty Exam:  Presentation  General Appearance:  Appropriate for Environment; Neat  Eye Contact: Minimal  Speech: Clear and Coherent; Normal Rate  Speech  Volume: Increased  Handedness: Right   Mood and Affect  Mood: Anxious; Irritable  Affect: Appropriate   Thought Process  Thought Processes: Coherent  Descriptions of Associations:Tangential  Orientation:Full (Time, Place and Person) (and situation)  Thought Content:WDL  History of Schizophrenia/Schizoaffective disorder:No  Duration of Psychotic Symptoms:No data recorded Hallucinations:Hallucinations: None  Ideas of Reference:None  Suicidal  Thoughts:Suicidal Thoughts: No SI Passive Intent and/or Plan: -- (stated " I was just depressed I wasnt sucidal")  Homicidal Thoughts:Homicidal Thoughts: No   Sensorium  Memory: Immediate Good; Remote Good  Judgment: Fair  Insight: Poor   Executive Functions  Concentration: Fair  Attention Span: Good  Recall: Good  Fund of Knowledge: Good  Language: Good   Psychomotor Activity  Psychomotor Activity: Psychomotor Activity: Normal   Assets  Assets: Communication Skills; Financial Resources/Insurance; Housing   Sleep  Sleep: Sleep: Good Number of Hours of Sleep: 8    Physical Exam: Physical Exam Vitals and nursing note reviewed.  HENT:     Head: Normocephalic and atraumatic.     Nose: Nose normal.  Pulmonary:     Effort: Pulmonary effort is normal.  Musculoskeletal:        General: Normal range of motion.     Cervical back: Normal range of motion and neck supple.  Neurological:     General: No focal deficit present.     Mental Status: He is alert and oriented to person, place, and time.  Psychiatric:        Attention and Perception: Attention and perception normal.        Mood and Affect: Mood is anxious. Affect is flat.        Speech: Speech normal.        Behavior: Behavior normal. Behavior is cooperative.        Thought Content: Thought content normal.        Cognition and Memory: Cognition and memory normal.        Judgment: Judgment normal.    ROS Blood pressure 131/79,  pulse 78, temperature (!) 97.2 F (36.2 C), resp. rate 18, height 5\' 6"  (1.676 m), weight 53.5 kg, SpO2 100%. Body mass index is 19.05 kg/m.   Treatment Plan Summary: Daily contact with patient to assess and evaluate symptoms and progress in treatment and Medication management 1.Gabapentin 300mg   to assist with cravings related to cannabis use and provide some relief for chronic back pain. The patient has agreed to this plan after an explanation of its benefits. 2.Consider starting Sertraline (Zoloft) or another SSRI to address depressive symptoms and anxiety. Monitor for any side effects or changes in suicidal ideation. 3. Start Trazodone 50 mg nightly for insomnia, given its efficacy in patients with depressive symptoms as Needed 4.Remeron 15mg  (Mirtazapine) can be considered for the treatment of major depressive disorder (MDD) in this patient, especially given his history of significant depressive symptoms, grief, poor sleep, and appetite changes. Mirtazapine may also help alleviate insomnia and appetite loss, which are common in depression 5.Nicotine replacement therapy: Offer nicotine patches or gum to help reduce cigarette use. 6.Close monitoring for suicidal ideation and any changes in mood or behavior. Regularly assess the patient's safety while inpatient. 7.Referral to [facility] services for additional substance use support if needed Myriam Forehand, NP 02/21/2023, 6:56 PM

## 2023-02-21 NOTE — Group Note (Signed)
Date:  02/21/2023 Time:  6:58 PM  Group Topic/Focus:  Outdoor recreation structured activity.    Participation Level:  Active  Participation Quality:  Appropriate  Affect:  Appropriate  Cognitive:  Appropriate  Insight: Appropriate  Engagement in Group:  Developing/Improving and Engaged  Modes of Intervention:  Activity, Discussion, and Socialization  Additional Comments:    Rosaura Carpenter 02/21/2023, 6:58 PM

## 2023-02-21 NOTE — Plan of Care (Signed)
[facility] Tonsillectomy/Adenoidectomy, Postoperative PEDS care plan entered in error.

## 2023-02-21 NOTE — Plan of Care (Signed)
  Problem: Education: Goal: Knowledge of General Education information will improve Description: Including pain rating scale, medication(s)/side effects and non-pharmacologic comfort measures Outcome: Progressing   Problem: Health Behavior/Discharge Planning: Goal: Ability to manage health-related needs will improve Outcome: Progressing   Problem: Clinical Measurements: Goal: Ability to maintain clinical measurements within normal limits will improve Outcome: Progressing Goal: Will remain free from infection Outcome: Progressing Goal: Diagnostic test results will improve Outcome: Progressing Goal: Respiratory complications will improve Outcome: Progressing Goal: Cardiovascular complication will be avoided Outcome: Progressing   Problem: Activity: Goal: Risk for activity intolerance will decrease Outcome: Progressing   Problem: Nutrition: Goal: Adequate nutrition will be maintained Outcome: Progressing   Problem: Coping: Goal: Level of anxiety will decrease Outcome: Progressing   Problem: Elimination: Goal: Will not experience complications related to bowel motility Outcome: Progressing Goal: Will not experience complications related to urinary retention Outcome: Progressing   Problem: Pain Managment: Goal: General experience of comfort will improve Outcome: Progressing   Problem: Safety: Goal: Ability to remain free from injury will improve Outcome: Progressing   Problem: Skin Integrity: Goal: Risk for impaired skin integrity will decrease Outcome: Progressing   Problem: Education: Goal: Knowledge of [place] [facility] Education information/materials will improve Outcome: Progressing Goal: Emotional status will improve Outcome: Progressing Goal: Mental status will improve Outcome: Progressing Goal: Verbalization of understanding the information provided will improve Outcome: Progressing   Problem: Activity: Goal: Interest or engagement in activities will  improve Outcome: Progressing Goal: Sleeping patterns will improve Outcome: Progressing   Problem: Coping: Goal: Ability to verbalize frustrations and anger appropriately will improve Outcome: Progressing Goal: Ability to demonstrate self-control will improve Outcome: Progressing   Problem: Health Behavior/Discharge Planning: Goal: Identification of resources available to assist in meeting health care needs will improve Outcome: Progressing Goal: Compliance with treatment plan for underlying cause of condition will improve Outcome: Progressing   Problem: Physical Regulation: Goal: Ability to maintain clinical measurements within normal limits will improve Outcome: Progressing   Problem: Safety: Goal: Periods of time without injury will increase Outcome: Progressing   Problem: Coping: Goal: Coping ability will improve Outcome: Progressing Goal: Will verbalize feelings Outcome: Progressing   Problem: Self-Concept: Goal: Will verbalize positive feelings about self Outcome: Progressing Goal: Level of anxiety will decrease Outcome: Progressing   Problem: Self-Concept: Goal: Ability to disclose and discuss suicidal ideas will improve Outcome: Progressing Goal: Will verbalize positive feelings about self Outcome: Progressing

## 2023-02-21 NOTE — Group Note (Unsigned)
Date:  02/21/2023 Time:  9:55 PM  Group Topic/Focus:  Wrap-Up Group:   The focus of this group is to help patients review their daily goal of treatment and discuss progress on daily workbooks.     Participation Level:  {BHH PARTICIPATION RUEAV:40981}  Participation Quality:  {BHH PARTICIPATION QUALITY:22265}  Affect:  {BHH AFFECT:22266}  Cognitive:  {BHH COGNITIVE:22267}  Insight: {BHH Insight2:20797}  Engagement in Group:  {BHH ENGAGEMENT IN XBJYN:82956}  Modes of Intervention:  {BHH MODES OF INTERVENTION:22269}  Additional Comments:  ***  Belva Crome 02/21/2023, 9:55 PM

## 2023-02-21 NOTE — Plan of Care (Signed)
D- Patient alert and oriented. Patient presented in a pleasant mood on assessment reporting that he slept "like a baby" last night and had no complaints to voice to this Clinical research associate. Patient denied SI, HI, AVH, and pain at this time. Patient also denied any signs/symptoms of depression and anxiety, stating that he is "ready to go, I have taken all that I can take. I have to work too or I won't have a place to stay when I leave". Patient's goal for today, per his self-inventory is "biting my tongue in certain situations and just realizing that every situation doesn't need a response", in which he will "stay humble and prayed up", in order to achieve his goal.  A- Scheduled medications administered to patient, per MD orders. Support and encouragement provided. Routine safety checks conducted every 15 minutes. Patient informed to notify staff with problems or concerns.  R- No adverse drug reactions noted. Patient contracts for safety at this time. Patient compliant with medications and treatment plan. Patient receptive, calm, and cooperative. Patient interacts well with others on the unit. Patient remains safe at this time.  Problem: Education: Goal: Knowledge of General Education information will improve Description: Including pain rating scale, medication(s)/side effects and non-pharmacologic comfort measures Outcome: Progressing   Problem: Health Behavior/Discharge Planning: Goal: Ability to manage health-related needs will improve Outcome: Progressing   Problem: Clinical Measurements: Goal: Ability to maintain clinical measurements within normal limits will improve Outcome: Progressing Goal: Will remain free from infection Outcome: Progressing Goal: Diagnostic test results will improve Outcome: Progressing Goal: Respiratory complications will improve Outcome: Progressing Goal: Cardiovascular complication will be avoided Outcome: Progressing   Problem: Activity: Goal: Risk for activity  intolerance will decrease Outcome: Progressing   Problem: Nutrition: Goal: Adequate nutrition will be maintained Outcome: Progressing   Problem: Coping: Goal: Level of anxiety will decrease Outcome: Progressing   Problem: Elimination: Goal: Will not experience complications related to bowel motility Outcome: Progressing Goal: Will not experience complications related to urinary retention Outcome: Progressing   Problem: Pain Managment: Goal: General experience of comfort will improve Outcome: Progressing   Problem: Safety: Goal: Ability to remain free from injury will improve Outcome: Progressing   Problem: Skin Integrity: Goal: Risk for impaired skin integrity will decrease Outcome: Progressing   Problem: Education: Goal: Knowledge of [place] [facility] Education information/materials will improve Outcome: Progressing Goal: Emotional status will improve Outcome: Progressing Goal: Mental status will improve Outcome: Progressing Goal: Verbalization of understanding the information provided will improve Outcome: Progressing   Problem: Activity: Goal: Interest or engagement in activities will improve Outcome: Progressing Goal: Sleeping patterns will improve Outcome: Progressing   Problem: Coping: Goal: Ability to verbalize frustrations and anger appropriately will improve Outcome: Progressing Goal: Ability to demonstrate self-control will improve Outcome: Progressing   Problem: Health Behavior/Discharge Planning: Goal: Identification of resources available to assist in meeting health care needs will improve Outcome: Progressing Goal: Compliance with treatment plan for underlying cause of condition will improve Outcome: Progressing   Problem: Physical Regulation: Goal: Ability to maintain clinical measurements within normal limits will improve Outcome: Progressing   Problem: Safety: Goal: Periods of time without injury will increase Outcome: Progressing    Problem: Coping: Goal: Coping ability will improve Outcome: Progressing Goal: Will verbalize feelings Outcome: Progressing   Problem: Self-Concept: Goal: Will verbalize positive feelings about self Outcome: Progressing Goal: Level of anxiety will decrease Outcome: Progressing   Problem: Self-Concept: Goal: Ability to disclose and discuss suicidal ideas will improve Outcome: Progressing

## 2023-02-21 NOTE — Group Note (Signed)
Date:  02/21/2023 Time:  1:45 PM  Group Topic/Focus:   is a form of psychotherapy that uses art to help people express themselves and communicate their feelings    Participation Level:  Active  Participation Quality:  Appropriate  Affect:  Appropriate  Cognitive:  Alert  Insight: Appropriate  Engagement in Group:  Developing/Improving and Engaged  Modes of Intervention:  Activity  Additional Comments:    Kavita Bartl 02/21/2023, 1:45 PM

## 2023-02-22 DIAGNOSIS — F332 Major depressive disorder, recurrent severe without psychotic features: Secondary | ICD-10-CM | POA: Diagnosis not present

## 2023-02-22 MED ORDER — LIDOCAINE 5 % EX PTCH: 1.0000 | MEDICATED_PATCH | CUTANEOUS | 0 refills | Status: DC

## 2023-02-22 MED ORDER — GABAPENTIN 300 MG PO CAPS: 300.0000 mg | ORAL_CAPSULE | Freq: Three times a day (TID) | ORAL | 0 refills | Status: DC

## 2023-02-22 MED ORDER — PROSIGHT PO TABS: 1.0000 | ORAL_TABLET | Freq: Every day | ORAL | 0 refills | Status: DC

## 2023-02-22 MED ORDER — ENSURE ENLIVE PO LIQD: 1.0000 | Freq: Two times a day (BID) | ORAL | 0 refills | Status: DC

## 2023-02-22 MED ORDER — MIRTAZAPINE 15 MG PO TABS: 15.0000 mg | ORAL_TABLET | Freq: Every day | ORAL | 0 refills | Status: DC

## 2023-02-22 MED ORDER — NICOTINE 21 MG/24HR TD PT24: 21.0000 mg | MEDICATED_PATCH | Freq: Every day | TRANSDERMAL | 0 refills | Status: DC

## 2023-02-22 NOTE — Group Note (Signed)
Recreation Therapy Group Note   Group Topic:Healthy Support Systems  Group Date: 02/22/2023 Start Time: 1000 End Time: 1100 Facilitators: Rosina Lowenstein, LRT, CTRS Location: Courtyard   Group Description: Emotional Check in. Patient sat and talked with LRT about how they are doing and whatever else is on their mind. LRT provided active listening, reassurance and encouragement. Pts were given the opportunity to listen to music or play cornhole while getting fresh air and sunlight in the courtyard.    Goal Area(s) Addressed: Patient will engage in conversation with LRT. Patient will communicate their wants, needs, or questions.  Patient will practice a new coping skill of "talking to someone".  Affect/Mood: N/A   Participation Level: Did not attend    Clinical Observations/Individualized Feedback: [name] did not attend group.  Plan: Continue to engage patient in RT group sessions 2-3x/week.   Rosina Lowenstein, LRT, CTRS 02/22/2023 11:20 AM

## 2023-02-22 NOTE — Progress Notes (Signed)
Patient ID: [name], male   DOB: 1971-02-16, 52 y.o.   MRN: [id number]  Discharge Note:  Patient denies SI/HI/AVH at this time. Discharge instructions, AVS, prescriptions, and transition record gone over with patient. Patient given a copy of his suicidal safety plan. Patient.agrees to comply with medication management, follow-up visit, and outpatient therapy. Patient belongings returned to patient. Patient questions and concerns addressed and answered. Patient ambulatory off unit. Patient discharged to home via [facility].

## 2023-02-22 NOTE — Discharge Summary (Signed)
Physician Discharge Summary Note  Patient:  [name] is an 52 y.o., male MRN:  [id number] DOB:  [birth year] Patient phone:  [phone] (home)  Patient address:   [address] [place] [address],  Total Time spent with patient: 45 minutes  Date of Admission:  02/18/2023 Date of Discharge: 02/22/2023  Reason for Admission: 52 y/o male admitted to inpatient psychiatry for worsening depression, increased cannabis use, and concerns about self injurious behavior.  Principal Problem: MDD (major depressive disorder), recurrent severe, without psychosis (HCC) Discharge Diagnoses: Principal Problem:   MDD (major depressive disorder), recurrent severe, without psychosis (HCC)  Subjective: Pt chart reviewed, discussed with interdisciplinary team, and seen on rounds. Denies suicidal, homicidal ideations. Denies auditory visual hallucinations or paranoia. Endorses readiness for discharge.   Past Psychiatric History: Polysubstance abuse, mdd   Past Medical History:  Past Medical History:  Diagnosis Date   Alcohol abuse    Anemia    Colitis    Severe major depression with psychotic features (HCC)    History reviewed. No pertinent surgical history. Family History:  Family History  Problem Relation Age of Onset   Hypertension Mother    Hypertension Father    Hypertension Other    Family Psychiatric  History: Mother alcohol abuse Social History:  Social History   Substance and Sexual Activity  Alcohol Use No   Comment: former  last use 5+ year ago     Social History   Substance and Sexual Activity  Drug Use Yes   Frequency: 5.0 times per week   Types: Marijuana   Comment: 10 years 11/03    Social History   Socioeconomic History   Marital status: Single    Spouse name: Not on file   Number of children: Not on file   Years of education: Not on file   Highest education level: Not on file  Occupational History   Not on file  Tobacco Use   Smoking status:  Every Day    Current packs/day: 0.25    Types: Cigarettes   Smokeless tobacco: Never  Vaping Use   Vaping status: Never Used  Substance and Sexual Activity   Alcohol use: No    Comment: former  last use 5+ year ago   Drug use: Yes    Frequency: 5.0 times per week    Types: Marijuana    Comment: 10 years 11/03   Sexual activity: Yes    Birth control/protection: Condom  Other Topics Concern   Not on file  Social History Narrative   Not on file   Social Determinants of Health   Financial Resource Strain: High Risk (03/19/2019)   Overall Financial Resource Strain (CARDIA)    Difficulty of Paying Living Expenses: Very hard  Food Insecurity: Food Insecurity Present (02/18/2023)   Hunger Vital Sign    Worried About Running Out of Food in the Last Year: Sometimes true    Ran Out of Food in the Last Year: Sometimes true  Transportation Needs: No Transportation Needs (02/18/2023)   PRAPARE - Administrator, Civil Service (Medical): No    Lack of Transportation (Non-Medical): No  Physical Activity: Not on file  Stress: Not on file  Social Connections: Not on file    Hospital Course:  [name] was admitted for MDD (major depressive disorder), recurrent severe, without psychosis (HCC) and crisis management.  [name] was discharged with current medication and was instructed on how to take medications as prescribed; (details listed  below under Medication List).  Medical problems were identified and treated as needed.  Home medications were restarted as appropriate.  Improvement was monitored by observation and [name] daily report of symptom reduction.  Emotional and mental status was monitored by daily self-inventory reports completed by [name] and clinical staff.         [name] was evaluated by the treatment team for stability and plans for continued recovery upon discharge.  [name] motivation was an integral factor for  scheduling further treatment.  Employment, transportation, bed availability, health status, family support, and any pending legal issues were also considered during his hospital stay.  He was offered further treatment options upon discharge including but not limited to Residential, Intensive Outpatient, and Outpatient treatment.  [name] will follow up with the services as listed below under Follow Up Information.     Upon completion of this admission the [name] was both mentally and medically stable for discharge denying suicidal/homicidal ideation, auditory/visual/tactile hallucinations, delusional thoughts and paranoia.       Physical Findings: AIMS:  , ,  ,  ,    CIWA:    COWS:     Musculoskeletal: Strength & Muscle Tone: within normal limits Gait & Station: normal Patient leans: N/A   Psychiatric Specialty Exam:  Presentation  General Appearance:  Appropriate for Environment  Eye Contact: Fair  Speech: Clear and Coherent; Normal Rate  Speech Volume: Normal  Handedness: Right   Mood and Affect  Mood: -- ("fine")  Affect: Blunt   Thought Process  Thought Processes: Coherent; Goal Directed; Linear  Descriptions of Associations:Intact  Orientation:Full (Time, Place and Person)  Thought Content:Logical  History of Schizophrenia/Schizoaffective disorder:No  Duration of Psychotic Symptoms:Not applicable Hallucinations:Hallucinations: None  Ideas of Reference:None  Suicidal Thoughts:Suicidal Thoughts: No  Homicidal Thoughts:Homicidal Thoughts: No   Sensorium  Memory: Immediate Good; Remote Good; Recent Good  Judgment: Good  Insight: Good   Executive Functions  Concentration: Good  Attention Span: Good  Recall: Good  Fund of Knowledge: Good  Language: Good   Psychomotor Activity  Psychomotor Activity: Psychomotor Activity: Normal   Assets  Assets: Communication Skills; Desire for Improvement; Financial  Resources/Insurance; Housing; Leisure Time; Physical Health; Resilience; Vocational/Educational   Sleep  Sleep: Sleep: Good    Physical Exam: Physical Exam Constitutional:      General: He is not in acute distress.    Appearance: He is not ill-appearing, toxic-appearing or diaphoretic.  Eyes:     General: No scleral icterus. Cardiovascular:     Rate and Rhythm: Normal rate.  Pulmonary:     Effort: Pulmonary effort is normal. No respiratory distress.  Neurological:     Mental Status: He is alert and oriented to person, place, and time.  Psychiatric:        Attention and Perception: Attention and perception normal.        Mood and Affect: Mood normal. Affect is blunt.        Speech: Speech normal.        Behavior: Behavior normal. Behavior is cooperative.        Thought Content: Thought content normal.        Cognition and Memory: Cognition and memory normal.        Judgment: Judgment normal.    Review of Systems  Constitutional:  Negative for chills and fever.  Respiratory:  Negative for shortness of breath.   Cardiovascular:  Negative for chest pain and palpitations.  Gastrointestinal:  Negative for  abdominal pain.  Neurological:  Negative for headaches.  Psychiatric/Behavioral: Negative.     Blood pressure 117/80, pulse 76, temperature 98.1 F (36.7 C), resp. rate 17, height 5\' 6"  (1.676 m), weight 53.5 kg, SpO2 99%. Body mass index is 19.05 kg/m.   Social History   Tobacco Use  Smoking Status Every Day   Current packs/day: 0.25   Types: Cigarettes  Smokeless Tobacco Never   Tobacco Cessation:  A prescription for an FDA-approved tobacco cessation medication provided at discharge   Blood Alcohol level:  Lab Results  Component Value Date   [facility] <10 02/17/2023   ETH <10 09/28/2021    Metabolic Disorder Labs:  Lab Results  Component Value Date   HGBA1C 5.2 08/27/2014   MPG 103 08/27/2014   MPG 105 03/08/2013   No results found for: "PROLACTIN" No  results found for: "CHOL", "TRIG", "HDL", "CHOLHDL", "VLDL", "LDLCALC"  See Psychiatric Specialty Exam and Suicide Risk Assessment completed by Attending Physician prior to discharge.  Discharge destination:  Home  Is patient on multiple antipsychotic therapies at discharge:  No   Has Patient had three or more failed trials of antipsychotic monotherapy by history:  No  Recommended Plan for Multiple Antipsychotic Therapies: NA   Allergies as of 02/22/2023       Reactions   Acetaminophen Nausea Only   Aspirin Hives, Nausea And Vomiting   Ibuprofen Hives, Nausea And Vomiting   Pork-derived Products Other (See Comments)   Stomach pain. Pt does not eat pork   Sertraline Diarrhea, Other (See Comments)   Acute GI distress with diarrhea and pain.         Medication List     STOP taking these medications    methocarbamol 500 MG tablet Commonly known as: ROBAXIN   nicotine 14 mg/24hr patch Commonly known as: NICODERM CQ - dosed in mg/24 hours Replaced by: nicotine 21 mg/24hr patch   traMADol 50 MG tablet Commonly known as: ULTRAM       TAKE these medications      Indication  feeding supplement Liqd Take 237 mLs by mouth 2 (two) times daily between meals.  Indication: feeding supplement   gabapentin 300 MG capsule Commonly known as: NEURONTIN Take 1 capsule (300 mg total) by mouth 3 (three) times daily.  Indication: Neuropathic Pain   lidocaine 5 % Commonly known as: LIDODERM Place 1 patch onto the skin daily. Remove & Discard patch within 12 hours or as directed by MD Start taking on: February 23, 2023  Indication: pain   mirtazapine 15 MG tablet Commonly known as: REMERON Take 1 tablet (15 mg total) by mouth at bedtime.  Indication: Major Depressive Disorder   multivitamin Tabs tablet Take 1 tablet by mouth daily. Start taking on: February 23, 2023  Indication: nutritional supplement   nicotine 21 mg/24hr patch Commonly known as: NICODERM CQ - dosed in  mg/24 hours Place 1 patch (21 mg total) onto the skin daily at 6 (six) AM. Start taking on: February 23, 2023 Replaces: nicotine 14 mg/24hr patch  Indication: Nicotine Addiction        Follow-up Information     [facility] Follow up.   Why: Walk in hours are Monday, Wednesday and Friday. Contact information: [address] [place], [place] [address]  [phone]               Follow-up recommendations:   Follow up with outpatient psychiatry and counseling  Signed: Lauree Chandler, NP 02/22/2023, 10:33 AM

## 2023-02-22 NOTE — Progress Notes (Addendum)
  [facility] Adult Case Management Discharge Plan :  Will you be returning to the same living situation after discharge:  Yes,  pt reports that he is returning home. At discharge, do you have transportation home?: Yes,  pt reports that he has transportation.  Do you have the ability to pay for your medications: Yes,   [place] MEDICAID PREPAID HEALTH PLAN / [place] MEDICAID HEALTHY BLUE  Release of information consent forms completed and in the chart;  Patient's signature needed at discharge.  Patient to Follow up at:  Follow-up Information     [facility] Follow up.   Why: Walk in hours are Monday, Wednesday and Friday. Contact information: [address] [place], [place] [address]  [phone]                 Next level of care provider has access to [facility] Link:no  Safety Planning and Suicide Prevention discussed: Yes,  SPE completed with the patient, patient declined collateral contact.      Has patient been referred to the Quitline?: Patient refused referral for treatment  Patient has been referred for addiction treatment: Patient refused referral for treatment.  Harden Mo, LCSW 02/22/2023, 10:05 AM

## 2023-02-22 NOTE — Progress Notes (Signed)
Patient was pleasant on approach, he denies SI, H & AVH. He seemed to sleep well through out the night. He reports that he is looking forward to discharge today.

## 2023-02-22 NOTE — Progress Notes (Signed)
   02/22/23 1213  Provider Notification  Provider Name/Title Nedra Hai, NP  Date Provider Notified 02/22/23  Time Provider Notified 1215  Method of Notification Page (secure chat)  Notification Reason Red med refusal

## 2023-02-22 NOTE — Plan of Care (Signed)
  Problem: Education: Goal: Knowledge of General Education information will improve Description: Including pain rating scale, medication(s)/side effects and non-pharmacologic comfort measures Outcome: Adequate for Discharge   Problem: Health Behavior/Discharge Planning: Goal: Ability to manage health-related needs will improve Outcome: Adequate for Discharge   Problem: Clinical Measurements: Goal: Ability to maintain clinical measurements within normal limits will improve Outcome: Adequate for Discharge Goal: Will remain free from infection Outcome: Adequate for Discharge Goal: Diagnostic test results will improve Outcome: Adequate for Discharge Goal: Respiratory complications will improve Outcome: Adequate for Discharge Goal: Cardiovascular complication will be avoided Outcome: Adequate for Discharge   Problem: Activity: Goal: Risk for activity intolerance will decrease Outcome: Adequate for Discharge   Problem: Nutrition: Goal: Adequate nutrition will be maintained Outcome: Adequate for Discharge   Problem: Coping: Goal: Level of anxiety will decrease Outcome: Adequate for Discharge   Problem: Elimination: Goal: Will not experience complications related to bowel motility Outcome: Adequate for Discharge Goal: Will not experience complications related to urinary retention Outcome: Adequate for Discharge   Problem: Pain Managment: Goal: General experience of comfort will improve Outcome: Adequate for Discharge   Problem: Safety: Goal: Ability to remain free from injury will improve Outcome: Adequate for Discharge   Problem: Skin Integrity: Goal: Risk for impaired skin integrity will decrease Outcome: Adequate for Discharge   Problem: Education: Goal: Knowledge of [place] General Education information/materials will improve Outcome: Adequate for Discharge Goal: Emotional status will improve Outcome: Adequate for Discharge Goal: Mental status will  improve Outcome: Adequate for Discharge Goal: Verbalization of understanding the information provided will improve Outcome: Adequate for Discharge   Problem: Activity: Goal: Interest or engagement in activities will improve Outcome: Adequate for Discharge Goal: Sleeping patterns will improve Outcome: Adequate for Discharge   Problem: Coping: Goal: Ability to verbalize frustrations and anger appropriately will improve Outcome: Adequate for Discharge Goal: Ability to demonstrate self-control will improve Outcome: Adequate for Discharge   Problem: Health Behavior/Discharge Planning: Goal: Identification of resources available to assist in meeting health care needs will improve Outcome: Adequate for Discharge Goal: Compliance with treatment plan for underlying cause of condition will improve Outcome: Adequate for Discharge   Problem: Physical Regulation: Goal: Ability to maintain clinical measurements within normal limits will improve Outcome: Adequate for Discharge   Problem: Safety: Goal: Periods of time without injury will increase Outcome: Adequate for Discharge   Problem: Coping: Goal: Coping ability will improve Outcome: Adequate for Discharge Goal: Will verbalize feelings Outcome: Adequate for Discharge   Problem: Self-Concept: Goal: Will verbalize positive feelings about self Outcome: Adequate for Discharge Goal: Level of anxiety will decrease Outcome: Adequate for Discharge   Problem: Self-Concept: Goal: Ability to disclose and discuss suicidal ideas will improve Outcome: Adequate for Discharge Goal: Will verbalize positive feelings about self Outcome: Adequate for Discharge

## 2023-02-22 NOTE — Progress Notes (Signed)
Suicide Risk Assessment  Discharge Assessment    [facility] Discharge Suicide Risk Assessment   Principal Problem: MDD (major depressive disorder), recurrent severe, without psychosis (HCC) Discharge Diagnoses: Principal Problem:   MDD (major depressive disorder), recurrent severe, without psychosis (HCC)   Total Time spent with patient: 45 minutes  Musculoskeletal: Strength & Muscle Tone: within normal limits Gait & Station: normal Patient leans: N/A  Psychiatric Specialty Exam  Presentation  General Appearance:  Appropriate for Environment  Eye Contact: Fair  Speech: Clear and Coherent; Normal Rate  Speech Volume: Normal  Handedness: Right   Mood and Affect  Mood: -- ("fine")  Affect: Blunt   Thought Process  Thought Processes: Coherent; Goal Directed; Linear  Descriptions of Associations:Intact  Orientation:Full (Time, Place and Person)  Thought Content:Logical  History of Schizophrenia/Schizoaffective disorder:No  Duration of Psychotic Symptoms:No Hallucinations:Hallucinations: None  Ideas of Reference:None  Suicidal Thoughts:Suicidal Thoughts: No  Homicidal Thoughts:Homicidal Thoughts: No   Sensorium  Memory: Immediate Good; Remote Good; Recent Good  Judgment: Good  Insight: Good   Executive Functions  Concentration: Good  Attention Span: Good  Recall: Good  Fund of Knowledge: Good  Language: Good   Psychomotor Activity  Psychomotor Activity:Psychomotor Activity: Normal   Assets  Assets: Communication Skills; Desire for Improvement; Financial Resources/Insurance; Housing; Leisure Time; Physical Health; Resilience; Vocational/Educational   Sleep  Sleep:Sleep: Good   Physical Exam: Physical Exam see discharge ROS see discharge Blood pressure 117/80, pulse 76, temperature 98.1 F (36.7 C), resp. rate 17, height 5\' 6"  (1.676 m), weight 53.5 kg, SpO2 99%. Body mass index is 19.05 kg/m.  Mental Status Per  Nursing Assessment::   On Admission:  Self-harm thoughts  Demographic Factors:  Male and Living alone  Loss Factors: NA  Historical Factors: Family history of mental illness or substance abuse  Risk Reduction Factors:   Employed, Positive social support, and Positive coping skills or problem solving skills  Continued Clinical Symptoms:  Previous Psychiatric Diagnoses and Treatments  Cognitive Features That Contribute To Risk:  None    Suicide Risk:  Minimal: No identifiable suicidal ideation.  Patients presenting with no risk factors but with morbid ruminations; may be classified as minimal risk based on the severity of the depressive symptoms   Follow-up Information     [facility] Follow up.   Why: Walk in hours are Monday, Wednesday and Friday. Contact information: [address] [place], [place] [address]  [phone]               Plan Of Care/Follow-up recommendations:  Follow up with outpatient psychiatry and counseling  Lauree Chandler, NP 02/22/2023, 10:33 AM

## 2023-02-22 NOTE — Plan of Care (Signed)
  Problem: Education: Goal: Knowledge of General Education information will improve Description: Including pain rating scale, medication(s)/side effects and non-pharmacologic comfort measures Outcome: Progressing   Problem: Health Behavior/Discharge Planning: Goal: Ability to manage health-related needs will improve Outcome: Progressing   Problem: Clinical Measurements: Goal: Ability to maintain clinical measurements within normal limits will improve Outcome: Progressing Goal: Will remain free from infection Outcome: Progressing Goal: Diagnostic test results will improve Outcome: Progressing Goal: Respiratory complications will improve Outcome: Progressing Goal: Cardiovascular complication will be avoided Outcome: Progressing   Problem: Activity: Goal: Risk for activity intolerance will decrease Outcome: Progressing   Problem: Nutrition: Goal: Adequate nutrition will be maintained Outcome: Progressing   Problem: Coping: Goal: Level of anxiety will decrease Outcome: Progressing   Problem: Elimination: Goal: Will not experience complications related to bowel motility Outcome: Progressing Goal: Will not experience complications related to urinary retention Outcome: Progressing   Problem: Pain Managment: Goal: General experience of comfort will improve Outcome: Progressing   Problem: Safety: Goal: Ability to remain free from injury will improve Outcome: Progressing   Problem: Skin Integrity: Goal: Risk for impaired skin integrity will decrease Outcome: Progressing   Problem: Education: Goal: Knowledge of [place] [facility] Education information/materials will improve Outcome: Progressing Goal: Emotional status will improve Outcome: Progressing Goal: Mental status will improve Outcome: Progressing Goal: Verbalization of understanding the information provided will improve Outcome: Progressing   Problem: Activity: Goal: Interest or engagement in activities will  improve Outcome: Progressing Goal: Sleeping patterns will improve Outcome: Progressing   Problem: Coping: Goal: Ability to verbalize frustrations and anger appropriately will improve Outcome: Progressing Goal: Ability to demonstrate self-control will improve Outcome: Progressing   Problem: Health Behavior/Discharge Planning: Goal: Identification of resources available to assist in meeting health care needs will improve Outcome: Progressing Goal: Compliance with treatment plan for underlying cause of condition will improve Outcome: Progressing   Problem: Physical Regulation: Goal: Ability to maintain clinical measurements within normal limits will improve Outcome: Progressing   Problem: Safety: Goal: Periods of time without injury will increase Outcome: Progressing   Problem: Coping: Goal: Coping ability will improve Outcome: Progressing Goal: Will verbalize feelings Outcome: Progressing   Problem: Self-Concept: Goal: Will verbalize positive feelings about self Outcome: Progressing Goal: Level of anxiety will decrease Outcome: Progressing   Problem: Self-Concept: Goal: Ability to disclose and discuss suicidal ideas will improve Outcome: Progressing Goal: Will verbalize positive feelings about self Outcome: Progressing Note: Patient is on track. Patient will maintain adherence

## 2023-02-22 NOTE — Progress Notes (Signed)
D- Patient alert and oriented. Patient presents in a preoccupied, but pleasant mood on assessment more focused on discharging today. Patient denies SI, HI, AVH, and pain at this time. Patient also denies any signs/symptoms of depression and anxiety. Patient's goal for today, per his self-inventory. Patient's goal for today, per his self-inventory is "staying humble and prayed up".  A- Scheduled medications administered to patient, per MD orders. Support and encouragement provided. Routine safety checks conducted every 15 minutes. Patient informed to notify staff with problems or concerns.  R- No adverse drug reactions noted. Patient contracts for safety at this time. Patient compliant with medications and treatment plan. Patient receptive, calm, and cooperative. Patient interacts well with others on the unit. Patient remains safe at this time.                             Patient presents in a preoccupied, but pleasant mood on assessn

## 2023-02-22 NOTE — Progress Notes (Signed)
Patient refused his scheduled Gabapentin, stating that it makes his leg hurt more. NP will be notified.

## 2023-03-22 ENCOUNTER — Emergency Department (HOSPITAL_COMMUNITY)
Admission: EM | Admit: 2023-03-22 | Discharge: 2023-03-23 | Disposition: A | Discharge status: Other Acute Inpatient Hospital | Payer: Medicaid Other | Attending: Emergency Medicine | Admitting: Emergency Medicine

## 2023-03-22 ENCOUNTER — Other Ambulatory Visit: Payer: Self-pay

## 2023-03-22 ENCOUNTER — Encounter (HOSPITAL_COMMUNITY): Payer: Self-pay

## 2023-03-22 DIAGNOSIS — R45851 Suicidal ideations: Secondary | ICD-10-CM | POA: Insufficient documentation

## 2023-03-22 DIAGNOSIS — F332 Major depressive disorder, recurrent severe without psychotic features: Secondary | ICD-10-CM | POA: Diagnosis present

## 2023-03-22 LAB — CBC
HCT: 38.7 % — ABNORMAL LOW (ref 39.0–52.0)
Hemoglobin: 12.5 g/dL — ABNORMAL LOW (ref 13.0–17.0)
MCH: 30.9 pg (ref 26.0–34.0)
MCHC: 32.3 g/dL (ref 30.0–36.0)
MCV: 95.8 fL (ref 80.0–100.0)
Platelets: 231 10*3/uL (ref 150–400)
RBC: 4.04 MIL/uL — ABNORMAL LOW (ref 4.22–5.81)
RDW: 13.2 % (ref 11.5–15.5)
WBC: 7 10*3/uL (ref 4.0–10.5)
nRBC: 0 % (ref 0.0–0.2)

## 2023-03-22 LAB — COMPREHENSIVE METABOLIC PANEL
ALT: 9 U/L (ref 0–44)
AST: 16 U/L (ref 15–41)
Albumin: 4.2 g/dL (ref 3.5–5.0)
Alkaline Phosphatase: 34 U/L — ABNORMAL LOW (ref 38–126)
Anion gap: 7 (ref 5–15)
BUN: 11 mg/dL (ref 6–20)
CO2: 28 mmol/L (ref 22–32)
Calcium: 9.7 mg/dL (ref 8.9–10.3)
Chloride: 105 mmol/L (ref 98–111)
Creatinine, Ser: 0.99 mg/dL (ref 0.61–1.24)
GFR, Estimated: >60 mL/min (ref >60)
Glucose, Bld: 89 mg/dL (ref 70–99)
Potassium: 3.8 mmol/L (ref 3.5–5.1)
Sodium: 140 mmol/L (ref 135–145)
Total Bilirubin: 0.7 mg/dL (ref <1.2)
Total Protein: 6.9 g/dL (ref 6.5–8.1)

## 2023-03-22 LAB — SALICYLATE LEVEL: Salicylate Lvl: <7 mg/dL — ABNORMAL LOW (ref 7.0–30.0)

## 2023-03-22 LAB — ETHANOL: Alcohol, Ethyl (B): <10 mg/dL (ref <10)

## 2023-03-22 LAB — ACETAMINOPHEN LEVEL: Acetaminophen (Tylenol), Serum: <10 ug/mL — ABNORMAL LOW (ref 10–30)

## 2023-03-22 NOTE — ED Triage Notes (Signed)
Pt states that he has been having "crazy thoughts from time to time". Pt states that he has had a lot of stressors lately and he now is having suicidal thoughts. Pt states that he does not have a plan, but says that he does not feel safe with himself at home.

## 2023-03-23 ENCOUNTER — Encounter (HOSPITAL_COMMUNITY): Payer: Self-pay | Admitting: Nurse Practitioner

## 2023-03-23 ENCOUNTER — Inpatient Hospital Stay (HOSPITAL_COMMUNITY)
Admission: AD | Admit: 2023-03-23 | Discharge: 2023-03-27 | DRG: 885 | Disposition: A | Discharge status: Home / Self Care | Payer: Commercial Managed Care - HMO | Source: Intra-hospital | Attending: Psychiatry | Admitting: Psychiatry

## 2023-03-23 DIAGNOSIS — F333 Major depressive disorder, recurrent, severe with psychotic symptoms: Principal | ICD-10-CM | POA: Diagnosis present

## 2023-03-23 DIAGNOSIS — F1210 Cannabis abuse, uncomplicated: Secondary | ICD-10-CM | POA: Diagnosis present

## 2023-03-23 DIAGNOSIS — Z634 Disappearance and death of family member: Secondary | ICD-10-CM | POA: Diagnosis not present

## 2023-03-23 DIAGNOSIS — Z5941 Food insecurity: Secondary | ICD-10-CM | POA: Diagnosis not present

## 2023-03-23 DIAGNOSIS — G8929 Other chronic pain: Secondary | ICD-10-CM | POA: Diagnosis present

## 2023-03-23 DIAGNOSIS — Z888 Allergy status to other drugs, medicaments and biological substances status: Secondary | ICD-10-CM | POA: Diagnosis not present

## 2023-03-23 DIAGNOSIS — F332 Major depressive disorder, recurrent severe without psychotic features: Secondary | ICD-10-CM | POA: Diagnosis not present

## 2023-03-23 DIAGNOSIS — M25559 Pain in unspecified hip: Principal | ICD-10-CM

## 2023-03-23 DIAGNOSIS — R45851 Suicidal ideations: Secondary | ICD-10-CM | POA: Diagnosis present

## 2023-03-23 DIAGNOSIS — F17210 Nicotine dependence, cigarettes, uncomplicated: Secondary | ICD-10-CM | POA: Diagnosis present

## 2023-03-23 DIAGNOSIS — D649 Anemia, unspecified: Secondary | ICD-10-CM | POA: Diagnosis present

## 2023-03-23 DIAGNOSIS — F329 Major depressive disorder, single episode, unspecified: Principal | ICD-10-CM | POA: Diagnosis present

## 2023-03-23 DIAGNOSIS — Z91014 Allergy to mammalian meats: Secondary | ICD-10-CM | POA: Diagnosis not present

## 2023-03-23 DIAGNOSIS — Z886 Allergy status to analgesic agent status: Secondary | ICD-10-CM | POA: Diagnosis not present

## 2023-03-23 DIAGNOSIS — Z5986 Financial insecurity: Secondary | ICD-10-CM | POA: Diagnosis not present

## 2023-03-23 DIAGNOSIS — Z630 Problems in relationship with spouse or partner: Secondary | ICD-10-CM | POA: Diagnosis not present

## 2023-03-23 DIAGNOSIS — Z602 Problems related to living alone: Secondary | ICD-10-CM | POA: Diagnosis present

## 2023-03-23 DIAGNOSIS — F1011 Alcohol abuse, in remission: Secondary | ICD-10-CM | POA: Diagnosis present

## 2023-03-23 LAB — RAPID URINE DRUG SCREEN, HOSP PERFORMED
Amphetamines: NOT DETECTED
Barbiturates: NOT DETECTED
Benzodiazepines: NOT DETECTED
Cocaine: NOT DETECTED
Opiates: NOT DETECTED
Tetrahydrocannabinol: POSITIVE — AB

## 2023-03-23 MED ORDER — MIRTAZAPINE 15 MG PO TABS
15.0000 mg | ORAL_TABLET | Freq: Every day | ORAL | Status: DC
  Administered 2023-03-23: 15 mg via ORAL
  Filled 2023-03-23 (×3): qty 1

## 2023-03-23 MED ORDER — HALOPERIDOL LACTATE 5 MG/ML IJ SOLN: 5.0000 mg | Freq: Three times a day (TID) | INTRAMUSCULAR | Status: DC | PRN

## 2023-03-23 MED ORDER — MAGNESIUM HYDROXIDE 400 MG/5ML PO SUSP: 30.0000 mL | Freq: Every day | ORAL | Status: DC | PRN

## 2023-03-23 MED ORDER — NICOTINE 21 MG/24HR TD PT24
21.0000 mg | MEDICATED_PATCH | Freq: Every day | TRANSDERMAL | Status: DC
  Administered 2023-03-26 – 2023-03-27 (×2): 21 mg via TRANSDERMAL
  Filled 2023-03-23 (×5): qty 1

## 2023-03-23 MED ORDER — NICOTINE 21 MG/24HR TD PT24
21.0000 mg | MEDICATED_PATCH | Freq: Every day | TRANSDERMAL | Status: DC
Start: 2023-03-23 — End: 2023-03-23
  Administered 2023-03-23: 21 mg via TRANSDERMAL
  Filled 2023-03-23: qty 1

## 2023-03-23 MED ORDER — GABAPENTIN 300 MG PO CAPS
300.0000 mg | ORAL_CAPSULE | Freq: Three times a day (TID) | ORAL | Status: DC
  Administered 2023-03-23: 300 mg via ORAL
  Filled 2023-03-23: qty 1

## 2023-03-23 MED ORDER — DIPHENHYDRAMINE HCL 25 MG PO CAPS: 50.0000 mg | ORAL_CAPSULE | Freq: Three times a day (TID) | ORAL | Status: DC | PRN

## 2023-03-23 MED ORDER — LORAZEPAM 2 MG/ML IJ SOLN: 2.0000 mg | Freq: Three times a day (TID) | INTRAMUSCULAR | Status: DC | PRN

## 2023-03-23 MED ORDER — HYDROXYZINE HCL 25 MG PO TABS
25.0000 mg | ORAL_TABLET | Freq: Three times a day (TID) | ORAL | Status: DC | PRN
  Administered 2023-03-23 – 2023-03-26 (×3): 25 mg via ORAL
  Filled 2023-03-23 (×2): qty 1

## 2023-03-23 MED ORDER — TRAZODONE HCL 50 MG PO TABS
50.0000 mg | ORAL_TABLET | Freq: Every evening | ORAL | Status: DC | PRN
  Filled 2023-03-23: qty 1

## 2023-03-23 MED ORDER — DIPHENHYDRAMINE HCL 50 MG/ML IJ SOLN: 50.0000 mg | Freq: Three times a day (TID) | INTRAMUSCULAR | Status: DC | PRN

## 2023-03-23 MED ORDER — GABAPENTIN 300 MG PO CAPS
300.0000 mg | ORAL_CAPSULE | Freq: Three times a day (TID) | ORAL | Status: DC
  Administered 2023-03-23: 300 mg via ORAL
  Filled 2023-03-23 (×9): qty 1

## 2023-03-23 MED ORDER — LORAZEPAM 1 MG PO TABS: 0.0000 mg | ORAL_TABLET | Freq: Four times a day (QID) | ORAL | Status: DC

## 2023-03-23 MED ORDER — MIRTAZAPINE 15 MG PO TABS: 15.0000 mg | ORAL_TABLET | Freq: Every day | ORAL | Status: DC

## 2023-03-23 MED ORDER — LORAZEPAM 1 MG PO TABS: 0.0000 mg | ORAL_TABLET | Freq: Two times a day (BID) | ORAL | Status: DC

## 2023-03-23 MED ORDER — ALUM & MAG HYDROXIDE-SIMETH 200-200-20 MG/5ML PO SUSP: 30.0000 mL | ORAL | Status: DC | PRN

## 2023-03-23 MED ORDER — NICOTINE POLACRILEX 2 MG MT GUM
2.0000 mg | CHEWING_GUM | Freq: Once | OROMUCOSAL | Status: AC
  Administered 2023-03-23: 2 mg via ORAL

## 2023-03-23 MED ORDER — THIAMINE MONONITRATE 100 MG PO TABS
100.0000 mg | ORAL_TABLET | Freq: Every day | ORAL | Status: DC
Start: 2023-03-23 — End: 2023-03-23
  Administered 2023-03-23: 100 mg via ORAL
  Filled 2023-03-23: qty 1

## 2023-03-23 MED ORDER — THIAMINE HCL 100 MG/ML IJ SOLN
100.0000 mg | Freq: Every day | INTRAMUSCULAR | Status: DC
Start: 2023-03-23 — End: 2023-03-23

## 2023-03-23 MED ORDER — HALOPERIDOL 5 MG PO TABS: 5.0000 mg | ORAL_TABLET | Freq: Three times a day (TID) | ORAL | Status: DC | PRN

## 2023-03-23 NOTE — ED Provider Notes (Signed)
[place] EMERGENCY DEPARTMENT AT [facility] Provider Note   CSN: [id number] Arrival date & time: 03/22/23  2148     History  Chief Complaint  Patient presents with   Mental Health Problem    [name] is a 52 y.o. male.  Patient presents to the emergency department stating that he is suicidal.  He was hospitalized a month ago for similar problems.  He reports that he has been taking his gabapentin as prescribed and has been doing well until today.  He feels unsafe currently.       Home Medications Prior to Admission medications   Medication Sig Start Date End Date Taking? Authorizing Provider  feeding supplement (ENSURE ENLIVE / ENSURE PLUS) LIQD Take 237 mLs by mouth 2 (two) times daily between meals. 02/22/23 03/24/23  Lauree Chandler, NP  gabapentin (NEURONTIN) 300 MG capsule Take 1 capsule (300 mg total) by mouth 3 (three) times daily. 02/22/23 03/24/23  Lauree Chandler, NP  lidocaine (LIDODERM) 5 % Place 1 patch onto the skin daily. Remove & Discard patch within 12 hours or as directed by MD 02/23/23 03/25/23  Lauree Chandler, NP  mirtazapine (REMERON) 15 MG tablet Take 1 tablet (15 mg total) by mouth at bedtime. 02/22/23 03/24/23  Lauree Chandler, NP  multivitamin (PROSIGHT) TABS tablet Take 1 tablet by mouth daily. 02/23/23 03/25/23  Lauree Chandler, NP  nicotine (NICODERM CQ - DOSED IN MG/24 HOURS) 21 mg/24hr patch Place 1 patch (21 mg total) onto the skin daily at 6 (six) AM. 02/23/23 03/25/23  Lauree Chandler, NP      Allergies    Acetaminophen, Aspirin, Ibuprofen, Pork-derived products, and Sertraline    Review of Systems   Review of Systems  Physical Exam Updated Vital Signs BP 113/74 (BP Location: Right Arm)   Pulse 73   Temp 98.3 F (36.8 C)   Resp 18   Ht 5\' 6"  (1.676 m)   Wt 53.5 kg   SpO2 96%   BMI 19.05 kg/m  Physical Exam Vitals and nursing note reviewed.  Constitutional:      General: He is not in  acute distress.    Appearance: He is well-developed.  HENT:     Head: Normocephalic and atraumatic.     Mouth/Throat:     Mouth: Mucous membranes are moist.  Eyes:     General: Vision grossly intact. Gaze aligned appropriately.     Extraocular Movements: Extraocular movements intact.     Conjunctiva/sclera: Conjunctivae normal.  Cardiovascular:     Rate and Rhythm: Normal rate and regular rhythm.     Pulses: Normal pulses.     Heart sounds: Normal heart sounds, S1 normal and S2 normal. No murmur heard.    No friction rub. No gallop.  Pulmonary:     Effort: Pulmonary effort is normal. No respiratory distress.     Breath sounds: Normal breath sounds.  Abdominal:     Palpations: Abdomen is soft.     Tenderness: There is no abdominal tenderness. There is no guarding or rebound.     Hernia: No hernia is present.  Musculoskeletal:        General: No swelling.     Cervical back: Full passive range of motion without pain, normal range of motion and neck supple. No pain with movement, spinous process tenderness or muscular tenderness. Normal range of motion.     Right lower leg: No edema.     Left lower leg: No edema.  Skin:    General: Skin is warm and dry.     Capillary Refill: Capillary refill takes less than 2 seconds.     Findings: No ecchymosis, erythema, lesion or wound.  Neurological:     Mental Status: He is alert and oriented to person, place, and time.     GCS: GCS eye subscore is 4. GCS verbal subscore is 5. GCS motor subscore is 6.     Cranial Nerves: Cranial nerves 2-12 are intact.     Sensory: Sensation is intact.     Motor: Motor function is intact. No weakness or abnormal muscle tone.     Coordination: Coordination is intact.  Psychiatric:        Mood and Affect: Mood normal.        Speech: Speech normal.        Behavior: Behavior normal.        Thought Content: Thought content includes suicidal ideation.     ED Results / Procedures / Treatments   Labs (all  labs ordered are listed, but only abnormal results are displayed) Labs Reviewed  COMPREHENSIVE METABOLIC PANEL - Abnormal; Notable for the following components:      Result Value   Alkaline Phosphatase 34 (*)    All other components within normal limits  SALICYLATE LEVEL - Abnormal; Notable for the following components:   Salicylate Lvl <7.0 (*)    All other components within normal limits  ACETAMINOPHEN LEVEL - Abnormal; Notable for the following components:   Acetaminophen (Tylenol), Serum <10 (*)    All other components within normal limits  CBC - Abnormal; Notable for the following components:   RBC 4.04 (*)    Hemoglobin 12.5 (*)    HCT 38.7 (*)    All other components within normal limits  RAPID URINE DRUG SCREEN, HOSP PERFORMED - Abnormal; Notable for the following components:   Tetrahydrocannabinol POSITIVE (*)    All other components within normal limits  ETHANOL    EKG None  Radiology No results found.  Procedures Procedures    Medications Ordered in ED Medications  LORazepam (ATIVAN) tablet 0-4 mg (has no administration in time range)  LORazepam (ATIVAN) tablet 0-4 mg (has no administration in time range)  thiamine (VITAMIN B1) tablet 100 mg (has no administration in time range)    Or  thiamine (VITAMIN B1) injection 100 mg (has no administration in time range)  nicotine (NICODERM CQ - dosed in mg/24 hours) patch 21 mg (has no administration in time range)    ED Course/ Medical Decision Making/ A&P                                 Medical Decision Making Amount and/or Complexity of Data Reviewed Labs: ordered.  Risk OTC drugs. Prescription drug management.   Presents to the emergency department stating that he is feeling suicidal.  He does have a history of polysubstance drug abuse and depression.  He was recently hospitalized for depression.  Patient medically clear, will require psychiatric evaluation to determine if he requires repeat  hospitalization.        Final Clinical Impression(s) / ED Diagnoses Final diagnoses:  Suicidal ideation    Rx / DC Orders ED Discharge Orders     None         Josimar Corning, Canary Brim, MD 03/23/23 [phone]

## 2023-03-23 NOTE — ED Notes (Signed)
Belongings obtained from previous RN. 2 bags of belongings and a red rolling suitcase [facility]. Pt still has his hat on and a shirt under his scrubs.

## 2023-03-23 NOTE — Consult Note (Signed)
[facility] ED ASSESSMENT   Reason for Consult:  SI Referring Physician:  Pollina Patient Identification: [name] MRN:  [id number] ED Chief Complaint: MDD (major depressive disorder), recurrent severe, without psychosis (HCC)  Diagnosis:  Principal Problem:   MDD (major depressive disorder), recurrent severe, without psychosis (HCC)   ED Assessment Time Calculation: Start Time: 1000 Stop Time: 1045 Total Time in Minutes (Assessment Completion): 45   HPI:   [name] is a 52 y.o. male patient presenting voluntary to [facility] due to SI with no plan. Patient denies HI, psychosis and alcohol/drug usage. Patient reports onset of SI was today, "thinking about everything, I am scared to be alone. Patient reported "I am losing everyone". Patient reports grief/loss, daughter died in 03-30-15, fiance died in 2016/03/29 and father died in 03-30-2019. Patient reports he has been 10 years sober from alcohol. Patient reports ongoing back pain therefore unable to fall asleep. Patient reported normal appetite.   Subjective:   Patient seen at [facility], ED for face-to-face psychiatric evaluation.  Patient is pleasant and cooperative with assessment.  Patient reiterated information above, he has been feeling more depressed and recently has had suicidal thoughts.  Patient mentions having a lot of grief that he has not worked through yet.  His daughter passed away in 2015-03-30, fianc died in Mar 29, 2016, and father died in 03/30/2019 whom he was really close with.  Patient denies any illicit substance use or alcohol use.  He lives alone, states he has been feeling very lonely which has contributed to his worsening depression and now suicidal thoughts.  He does currently work.  He mentions his only support system being his daughter who lives in [place].  They are able to talk over the phone occasionally.  Patient stated since he discharged from [place] around 1 month ago he has been trying to set up outpatient psychiatry and  therapy.  He has been taking his medications as prescribed.  He reports having an outpatient appointment in 2 weeks but was feeling suicidal and came to the hospital.  Continues to endorse suicidal ideations, denies any specific plan or intent.  Denies HI.  Denies any auditory or visual hallucinations.  He is unable to contract for safety states he would be afraid to go home and that he might try and hurt himself.  Patient is agreeable with recommendation for inpatient treatment at this time.  Past Psychiatric History:  MDD Complicated grief  Risk to Self or Others: Is the patient at risk to self? Yes Has the patient been a risk to self in the past 6 months? Yes Has the patient been a risk to self within the distant past? No Is the patient a risk to others? No Has the patient been a risk to others in the past 6 months? No Has the patient been a risk to others within the distant past? No  [place] Scale:  Flowsheet Row ED from 03/22/2023 in [facility] Emergency Department at [facility] Admission (Discharged) from 02/18/2023 in [facility] INPATIENT BEHAVIORAL MEDICINE ED from 02/17/2023 in [facility] Emergency Department at [facility]  C-SSRS RISK CATEGORY Low Risk Low Risk High Risk       AIMS:  , , ,  ,   ASAM: ASAM Multidimensional Assessment Summary Dimension 1:  Description of individual's past and current experiences of substance use and withdrawal: n/a Dimension 2:  Description of patient's biomedical conditions and  complications: n/a Dimension 3:  Description of emotional, behavioral, or cognitive  conditions and complications: n/a Dimension 4:  Description of Readiness to Change criteria: n/a Dimension 5:  Relapse, continued use, or continued problem potential critiera description: n/a Dimension 6:  Recovery/Iiving environment criteria description: n/a ASAM Recommended Level of Treatment:  (N/A)  Substance Abuse:  Alcohol / Drug Use Pain Medications: See  MAR Prescriptions: See MAR Over the Counter: See MAR History of alcohol / drug use?: Yes Longest period of sobriety (when/how long): Per chart pt has a hx of alcoholism but denies he currently drinks. Negative Consequences of Use: Financial, Personal relationships Withdrawal Symptoms: None  Past Medical History:  Past Medical History:  Diagnosis Date   Alcohol abuse    Anemia    Colitis    Severe major depression with psychotic features (HCC)    History reviewed. No pertinent surgical history. Family History:  Family History  Problem Relation Age of Onset   Hypertension Mother    Hypertension Father    Hypertension Other    Family Psychiatric  History: unknown Social History:  Social History   Substance and Sexual Activity  Alcohol Use No   Comment: former  last use 5+ year ago     Social History   Substance and Sexual Activity  Drug Use Yes   Frequency: 5.0 times per week   Types: Marijuana   Comment: 10 years 11/03    Social History   Socioeconomic History   Marital status: Single    Spouse name: Not on file   Number of children: Not on file   Years of education: Not on file   Highest education level: Not on file  Occupational History   Not on file  Tobacco Use   Smoking status: Every Day    Current packs/day: 0.25    Types: Cigarettes   Smokeless tobacco: Never  Vaping Use   Vaping status: Never Used  Substance and Sexual Activity   Alcohol use: No    Comment: former  last use 5+ year ago   Drug use: Yes    Frequency: 5.0 times per week    Types: Marijuana    Comment: 10 years 11/03   Sexual activity: Yes    Birth control/protection: Condom  Other Topics Concern   Not on file  Social History Narrative   Not on file   Social Determinants of Health   Financial Resource Strain: High Risk (03/19/2019)   Overall Financial Resource Strain (CARDIA)    Difficulty of Paying Living Expenses: Very hard  Food Insecurity: Food Insecurity Present  (02/18/2023)   Hunger Vital Sign    Worried About Running Out of Food in the Last Year: Sometimes true    Ran Out of Food in the Last Year: Sometimes true  Transportation Needs: No Transportation Needs (02/18/2023)   PRAPARE - Administrator, Civil Service (Medical): No    Lack of Transportation (Non-Medical): No  Physical Activity: Not on file  Stress: Not on file  Social Connections: Not on file   Additional Social History:    Allergies:   Allergies  Allergen Reactions   Acetaminophen Nausea Only   Aspirin Hives and Nausea And Vomiting   Ibuprofen Hives and Nausea And Vomiting   Pork-Derived Products Other (See Comments)    Stomach pain. Pt does not eat pork   Sertraline Diarrhea and Other (See Comments)    Acute GI distress with diarrhea and pain.     Labs:  Results for orders placed or performed during the hospital encounter of  03/22/23 (from the past 48 hour(s))  Comprehensive metabolic panel     Status: Abnormal   Collection Time: 03/22/23 11:06 PM  Result Value Ref Range   Sodium 140 135 - 145 mmol/L   Potassium 3.8 3.5 - 5.1 mmol/L   Chloride 105 98 - 111 mmol/L   CO2 28 22 - 32 mmol/L   Glucose, Bld 89 70 - 99 mg/dL    Comment: Glucose reference range applies only to samples taken after fasting for at least 8 hours.   BUN 11 6 - 20 mg/dL   Creatinine, Ser 5.28 0.61 - 1.24 mg/dL   Calcium 9.7 8.9 - 41.3 mg/dL   Total Protein 6.9 6.5 - 8.1 g/dL   Albumin 4.2 3.5 - 5.0 g/dL   AST 16 15 - 41 U/L   ALT 9 0 - 44 U/L   Alkaline Phosphatase 34 (L) 38 - 126 U/L   Total Bilirubin 0.7 <1.2 mg/dL   GFR, Estimated >24 >40 mL/min    Comment: (NOTE) Calculated using the CKD-EPI Creatinine Equation (2021)    Anion gap 7 5 - 15    Comment: Performed at [facility], [address], [place], [place] [address]  Ethanol     Status: None   Collection Time: 03/22/23 11:06 PM  Result Value Ref Range   Alcohol, Ethyl (B) <10 <10 mg/dL    Comment:  (NOTE) Lowest detectable limit for serum alcohol is 10 mg/dL.  For medical purposes only. Performed at [facility], [address] [place], [place] [address]   Salicylate level     Status: Abnormal   Collection Time: 03/22/23 11:06 PM  Result Value Ref Range   Salicylate Lvl <7.0 (L) 7.0 - 30.0 mg/dL    Comment: Performed at [facility] Lab, [address], [place], [place] [address]  Acetaminophen level     Status: Abnormal   Collection Time: 03/22/23 11:06 PM  Result Value Ref Range   Acetaminophen (Tylenol), Serum <10 (L) 10 - 30 ug/mL    Comment: (NOTE) Therapeutic concentrations vary significantly. A range of 10-30 ug/mL  may be an effective concentration for many patients. However, some  are best treated at concentrations outside of this range. Acetaminophen concentrations >150 ug/mL at 4 hours after ingestion  and >50 ug/mL at 12 hours after ingestion are often associated with  toxic reactions.  Performed at [facility], [address] [place], [place] [address]   cbc     Status: Abnormal   Collection Time: 03/22/23 11:06 PM  Result Value Ref Range   WBC 7.0 4.0 - 10.5 K/uL   RBC 4.04 (L) 4.22 - 5.81 MIL/uL   Hemoglobin 12.5 (L) 13.0 - 17.0 g/dL   HCT 63.8 (L) 75.6 - 43.3 %   MCV 95.8 80.0 - 100.0 fL   MCH 30.9 26.0 - 34.0 pg   MCHC 32.3 30.0 - 36.0 g/dL   RDW 29.5 18.8 - 41.6 %   Platelets 231 150 - 400 K/uL   nRBC 0.0 0.0 - 0.2 %    Comment: Performed at [facility], [address] [place], [place] [address]  Rapid urine drug screen (hospital performed)     Status: Abnormal   Collection Time: 03/22/23 11:37 PM  Result Value Ref Range   Opiates NONE DETECTED NONE DETECTED   Cocaine NONE DETECTED NONE DETECTED   Benzodiazepines NONE DETECTED NONE DETECTED   Amphetamines NONE DETECTED NONE DETECTED  Tetrahydrocannabinol POSITIVE (A) NONE DETECTED   Barbiturates NONE DETECTED NONE DETECTED    Comment: (NOTE) DRUG SCREEN FOR MEDICAL  PURPOSES ONLY.  IF CONFIRMATION IS NEEDED FOR ANY PURPOSE, NOTIFY LAB WITHIN 5 DAYS.  LOWEST DETECTABLE LIMITS FOR URINE DRUG SCREEN Drug Class                     Cutoff (ng/mL) Amphetamine and metabolites    1000 Barbiturate and metabolites    200 Benzodiazepine                 200 Opiates and metabolites        300 Cocaine and metabolites        300 THC                            50 Performed at [facility], [address] [place], [place] [address]     Current Facility-Administered Medications  Medication Dose Route Frequency Provider Last Rate Last Admin   gabapentin (NEURONTIN) capsule 300 mg  300 mg Oral TID Eligha Bridegroom, NP       LORazepam (ATIVAN) tablet 0-4 mg  0-4 mg Oral Q6H Pollina, Canary Brim, MD       [START ON 03/25/2023] LORazepam (ATIVAN) tablet 0-4 mg  0-4 mg Oral Q12H Pollina, Canary Brim, MD       mirtazapine (REMERON) tablet 15 mg  15 mg Oral QHS Eligha Bridegroom, NP       nicotine (NICODERM CQ - dosed in mg/24 hours) patch 21 mg  21 mg Transdermal Daily Gilda Crease, MD   21 mg at 03/23/23 1914   thiamine (VITAMIN B1) tablet 100 mg  100 mg Oral Daily Gilda Crease, MD   100 mg at 03/23/23 7829   Or   thiamine (VITAMIN B1) injection 100 mg  100 mg Intravenous Daily Pollina, Canary Brim, MD       Current Outpatient Medications  Medication Sig Dispense Refill   feeding supplement (ENSURE ENLIVE / ENSURE PLUS) LIQD Take 237 mLs by mouth 2 (two) times daily between meals. 14220 mL 0   gabapentin (NEURONTIN) 300 MG capsule Take 1 capsule (300 mg total) by mouth 3 (three) times daily. 90 capsule 0   lidocaine (LIDODERM) 5 % Place 1 patch onto the skin daily. Remove & Discard patch within 12 hours or as directed by MD 30 patch 0   mirtazapine (REMERON) 15 MG tablet Take 1 tablet (15 mg total) by mouth at bedtime. 30 tablet 0   multivitamin (PROSIGHT) TABS tablet Take 1 tablet by mouth daily. 30 tablet 0   nicotine (NICODERM CQ  - DOSED IN MG/24 HOURS) 21 mg/24hr patch Place 1 patch (21 mg total) onto the skin daily at 6 (six) AM. 30 patch 0    Musculoskeletal: Strength & Muscle Tone: within normal limits Gait & Station: normal Patient leans: N/A   Psychiatric Specialty Exam: Presentation  General Appearance:  Appropriate for Environment  Eye Contact: Good  Speech: Clear and Coherent  Speech Volume: Normal  Handedness: Right   Mood and Affect  Mood: Depressed; Hopeless  Affect: Congruent   Thought Process  Thought Processes: Goal Directed  Descriptions of Associations:Intact  Orientation:Full (Time, Place and Person)  Thought Content:WDL  History of Schizophrenia/Schizoaffective disorder:No  Duration of Psychotic Symptoms:No data recorded Hallucinations:Hallucinations: None  Ideas of Reference:None  Suicidal Thoughts:Suicidal Thoughts: Yes, Passive SI Passive Intent and/or Plan:  Without Intent; Without Plan  Homicidal Thoughts:Homicidal Thoughts: No   Sensorium  Memory: Immediate Good; Recent Good  Judgment: Fair  Insight: Good   Executive Functions  Concentration: Good  Attention Span: Good  Recall: Good  Fund of Knowledge: Good  Language: Good   Psychomotor Activity  Psychomotor Activity: Psychomotor Activity: Normal   Assets  Assets: Desire for Improvement; Physical Health; Resilience; Communication Skills    Sleep  Sleep: Sleep: Fair   Physical Exam: Physical Exam Neurological:     Mental Status: He is alert and oriented to person, place, and time.  Psychiatric:        Attention and Perception: Attention normal.        Mood and Affect: Mood is depressed. Affect is flat.        Speech: Speech normal.        Behavior: Behavior is cooperative.        Thought Content: Thought content includes suicidal ideation.    Review of Systems  Psychiatric/Behavioral:  Positive for depression and suicidal ideas.   All other systems  reviewed and are negative.  Blood pressure (!) 107/56, pulse 66, temperature 98.1 F (36.7 C), resp. rate 16, height 5\' 6"  (1.676 m), weight 53.5 kg, SpO2 95%. Body mass index is 19.05 kg/m.  Medical Decision Making: Pt case reviewed and discussed with Dr. Lucianne Muss. Will recommend inpatient psychiatric treatment at this time.   - continue Remeron 15 mg at bedtime - Continue Gabapentin 300 mg TID  Disposition:  recommend IP treatment  Eligha Bridegroom, NP 03/23/2023 10:48 AM

## 2023-03-23 NOTE — Plan of Care (Signed)
  Problem: Education: Goal: Emotional status will improve Outcome: Progressing Goal: Mental status will improve Outcome: Progressing   Problem: Activity: Goal: Interest or engagement in activities will improve Outcome: Progressing Goal: Sleeping patterns will improve Outcome: Progressing

## 2023-03-23 NOTE — Plan of Care (Signed)

## 2023-03-23 NOTE — Progress Notes (Signed)
Pt stated he has chronic back pain 8/10 pt given emotional support and hot pack . NP notified of pt pain . Pt informed to talk to the doctor tomorrow about different pain medications due to pt allergies. Pt stated that the Neurontin does not help with his pain .     03/23/23 2030  Psych Admission Type (Psych Patients Only)  Admission Status Voluntary  Psychosocial Assessment  Patient Complaints Anxiety  Eye Contact Fair  Facial Expression Anxious  Affect Preoccupied  Speech Logical/coherent  Interaction Assertive  Motor Activity Slow  Appearance/Hygiene Unremarkable  Behavior Characteristics Cooperative;Anxious  Mood Suspicious;Preoccupied  Aggressive Behavior  Effect No apparent injury  Thought Process  Coherency WDL  Content WDL  Delusions WDL  Perception WDL  Hallucination None reported or observed  Judgment WDL  Confusion WDL  Danger to Self  Current suicidal ideation? Passive  Self-Injurious Behavior Some self-injurious ideation observed or expressed.  No lethal plan expressed   Agreement Not to Harm Self Yes  Description of Agreement verbal contract for safety  Danger to Others  Danger to Others None reported or observed

## 2023-03-23 NOTE — Progress Notes (Addendum)
Pt has been accepted to [facility] on 03/23/2023 Bed assignment: 301-1  Pt meets inpatient criteria per: Eligha Bridegroom NP   Attending Physician will be: Phineas Inches, MD   Report can be called to: Adult unit: [phone]  Pt can arrive after (pending items are received Care Team   Notified: [facility] AC:  Marko Stai RN, Eligha Bridegroom NP, Florentina Jenny RN   [place] Versie Soave LCSW-A   03/23/2023 10:59 AM

## 2023-03-23 NOTE — BH Assessment (Signed)
Comprehensive Clinical Assessment (CCA) Note  03/23/2023 [name] [id number]  Disposition: Rockney Ghee, NP, recommends observation for safety with psych reassessment in the AM. Alycia Rossetti, RN, informed of disposition.   The patient demonstrates the following risk factors for suicide: Chronic risk factors for suicide include: psychiatric disorder of depression and chronic pain. Acute risk factors for suicide include: social withdrawal/isolation, loss (financial, interpersonal, professional), and recent discharge from inpatient psychiatry. Protective factors for this patient include: responsibility to others (children, family) and hope for the future. Considering these factors, the overall suicide risk at this point appears to be moderate. Patient is not appropriate for outpatient follow up.  [name] is a 52 year old male presenting voluntary to [facility] due to SI with no plan. Patient denies HI, psychosis and alcohol/drug usage. Patient reports onset of SI was today, "thinking about everything, I am scared to be alone. Patient reported "I am losing everyone". Patient reports grief/loss, daughter died in 04-13-2015, fiance died in 2016/04/12 and father died in 04/13/19. Patient reports he has been 10 years sober from alcohol. Patient reports ongoing back pain therefore unable to fall asleep. Patient reported normal appetite.   Patient reports having a [facility] Outpatient appointment in 2 weeks. Patient is not receiving any therapy. Patient was inpatient at [facility] 1 month ago. Patient denied prior suicide attempts and self-harming behaviors.   Patient resides alone. Patient has 4 children, 2 are deceased. Patient reports poor support system and the only family member he has in [place] is his daughter. Patient reports he is from [place] and has been in [place] for 26 years. 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 Patient denied access to guns. Patient was calm and cooperative during assessment.   Chief  Complaint:  Chief Complaint  Patient presents with   Suicidal   Visit Diagnosis:  Major depressive disorder   CCA Screening, Triage and Referral (STR)  Patient Reported Information How did you hear about [place]? Self  What Is the Reason for Your Visit/Call Today? SI with no plan.  How Long Has This Been Causing You Problems? <Week  What Do You Feel Would Help You the Most Today? Treatment for Depression or other mood problem   Have You Recently Had Any Thoughts About Hurting Yourself? Yes  Are You Planning to Commit Suicide/Harm Yourself At This time? No   Flowsheet Row ED from 03/22/2023 in [facility] at [facility] Admission (Discharged) from 02/18/2023 in [facility]PATIENT BE[facility]ICINE ED from 02/17/2023 in [facility] Emergency Department at [facility]  C-SSRS RISK CATEGORY Low Risk Low Risk High Risk       Have you Recently Had Thoughts About Hurting Someone [name]? No  Are You Planning to Harm Someone at This Time? No  Explanation: n/a   Have You Used Any Alcohol or Drugs in the Past 24 Hours? No  What Did You Use and How Much? n/a   Do You Currently Have a Therapist/Psychiatrist? No  Name of Therapist/Psychiatrist: Name of Therapist/Psychiatrist: n/a   Have You Been Recently Discharged From Any Office Practice or Programs? Yes  Explanation of Discharge From Practice/Program: 1 month ago discharged from [facility]     CCA Screening Triage Referral Assessment Type of Contact: Tele-Assessment  Telemedicine Service Delivery: Telemedicine service delivery: This service was provided via telemedicine using a 2-way, interactive audio and video technology  Is this Initial or Reassessment? Is this Initial or Reassessment?: Initial Assessment  Date Telepsych consult ordered  in [facility]:  Date Telepsych consult ordered in [facility]: 03/23/23  Time Telepsych consult ordered in [facility]:  Time Telepsych consult ordered in [facility]:  0032  Location of Assessment: [facility] ED  Provider Location: [facility] Assessment Services   Collateral Involvement: Pt declined   Does Patient Have a Automotive engineer Guardian? No  Legal Guardian Contact Information: n/a  Copy of Legal Guardianship Form: -- (n/a)  Legal Guardian Notified of Arrival: -- (n/a)  Legal Guardian Notified of Pending Discharge: -- (n/a)  If Minor and Not Living with Parent(s), Who has Custody? n/a  Is CPS involved or ever been involved? Never  Is APS involved or ever been involved? Never   Patient Determined To Be At Risk for Harm To Self or Others Based on Review of Patient Reported Information or Presenting Complaint? Yes, for Self-Harm  Method: No Plan  Availability of Means: No access or NA  Intent: Vague intent or NA  Notification Required: No need or identified person  Additional Information for Danger to Others Potential: -- (No HI.)  Additional Comments for Danger to Others Potential: No danger to others potential.  Are There Guns or Other Weapons in Your Home? No  Types of Guns/Weapons: Patient denies any accesss to guns.  Are These Weapons Safely Secured?                            No  Who Could Verify You Are Able To Have These Secured: No guns to safely secure.  Do You Have any Outstanding Charges, Pending Court Dates, Parole/Probation? none reported  Contacted To Inform of Risk of Harm To Self or Others: Family/Significant Other:    Does Patient Present under Involuntary Commitment? No    [place] of Residence: [place]   Patient Currently Receiving the Following Services: Not Receiving Services   Determination of Need: Urgent (48 hours)   Options For Referral: Outpatient Therapy; Medication Management; BH Urgent Care     CCA Biopsychosocial Patient Reported Schizophrenia/Schizoaffective Diagnosis in Past: No   Strengths: Pt is seeking assistance for his mental health concerns. He answered the questions  posed. Pt is able to identify his thoughts, feelings, and concerns.   Mental Health Symptoms Depression:   Difficulty Concentrating; Fatigue; Hopelessness; Sleep (too much or little)   Duration of Depressive symptoms:  Duration of Depressive Symptoms: Less than two weeks   Mania:   None   Anxiety:    Worrying; Tension; Sleep   Psychosis:   None   Duration of Psychotic symptoms:    Trauma:   Emotional numbing; Detachment from others   Obsessions:   None   Compulsions:   None   Inattention:   None   Hyperactivity/Impulsivity:   None   Oppositional/Defiant Behaviors:   None   Emotional Irregularity:   Potentially harmful impulsivity; Mood lability; Recurrent suicidal behaviors/gestures/threats   Other Mood/Personality Symptoms:   None noted    Mental Status Exam Appearance and self-care  Stature:   Tall   Weight:   Thin   Clothing:   -- (Pt in scrubs)   Grooming:   Normal   Cosmetic use:   None   Posture/gait:   Normal   Motor activity:   Not Remarkable   Sensorium  Attention:   Normal   Concentration:   Normal   Orientation:   Situation; Place; Person; Object; Time   Recall/memory:   Normal   Affect and Mood  Affect:  Anxious; Depressed   Mood:   Anxious; Depressed   Relating  Eye contact:   Normal   Facial expression:   Depressed   Attitude toward examiner:   Cooperative   Thought and Language  Speech flow:  Clear and Coherent   Thought content:   Appropriate to Mood and Circumstances   Preoccupation:   None   Hallucinations:   None   Organization:   Coherent; Development worker, international aid of Knowledge:   Average   Intelligence:   Average   Abstraction:   Normal   Judgement:   Fair   Dance movement psychotherapist:   Adequate   Insight:   Poor   Decision Making:   Impulsive   Social Functioning  Social Maturity:   Isolates   Social Judgement:   Heedless   Stress  Stressors:   Family  conflict; Grief/losses; Housing; Surveyor, quantity; Work   Coping Ability:   Deficient supports; Overwhelmed   Skill Deficits:   Decision making; Responsibility   Supports:   Support needed     Religion: Religion/Spirituality Are You A Religious Person?: Yes What is Your Religious Affiliation?: Christian How Might This Affect Treatment?: No affect on treatment  Leisure/Recreation: Leisure / Recreation Do You Have Hobbies?: Yes Leisure and Hobbies: reading  Exercise/Diet: Exercise/Diet Do You Exercise?: Yes What Type of Exercise Do You Do?: Run/Walk How Many Times a Week Do You Exercise?: 6-7 times a week Have You Gained or Lost A Significant Amount of Weight in the Past Six Months?: No Do You Follow a Special Diet?: No Do You Have Any Trouble Sleeping?: Yes Explanation of Sleeping Difficulties: Pt has trouble with getting to sleep due to back pain.   CCA Employment/Education Employment/Work Situation: Employment / Work Situation Employment Situation: Employed Work Stressors: "side jobs" Patient's Job has Been Impacted by Current Illness: No Has Patient ever Been in Equities trader?: No  Education: Education Is Patient Currently Attending School?: No Last Grade Completed: 12 (GED) Did You Product manager?: No Did You Have An Individualized Education Program (IIEP): No Did You Have Any Difficulty At [employer]?: No Patient's Education Has Been Impacted by Current Illness: No   CCA Family/Childhood History Family and Relationship History: Family history Marital status: Single Does patient have children?: Yes How many children?: 4 How is patient's relationship with their children?: Pt reports that he has 2 living children, 2 deceased.  "awesome, my oldest is deceased and the relationship was beautiful, a lot of resentment at not being able to attend the service"  Childhood History:  Childhood History By whom was/is the patient raised?: Both parents Did patient suffer any  verbal/emotional/physical/sexual abuse as a child?: No Did patient suffer from severe childhood neglect?: No Has patient ever been sexually abused/assaulted/raped as an adolescent or adult?: No Was the patient ever a victim of a crime or a disaster?: No Witnessed domestic violence?: Yes Has patient been affected by domestic violence as an adult?: No Description of domestic violence: "I grew up in [place]"       CCA Substance Use Alcohol/Drug Use: Alcohol / Drug Use Pain Medications: See MAR Prescriptions: See MAR Over the Counter: See MAR History of alcohol / drug use?: Yes Longest period of sobriety (when/how long): Per chart pt has a hx of alcoholism but denies he currently drinks. Negative Consequences of Use: Financial, Personal relationships Withdrawal Symptoms: None Substance #1 Name of Substance 1: n/a 1 - Age of First Use: n/a 1 - Amount (size/oz): n/a  1 - Frequency: n/a 1 - Duration: n/a 1 - Last Use / Amount: n/a 1 - Method of Aquiring: n/a 1- Route of Use: n/a                       ASAM's:  Six Dimensions of Multidimensional Assessment  Dimension 1:  Acute Intoxication and/or Withdrawal Potential:   Dimension 1:  Description of individual's past and current experiences of substance use and withdrawal: n/a  Dimension 2:  Biomedical Conditions and Complications:   Dimension 2:  Description of patient's biomedical conditions and  complications: n/a  Dimension 3:  Emotional, Behavioral, or Cognitive Conditions and Complications:  Dimension 3:  Description of emotional, behavioral, or cognitive conditions and complications: n/a  Dimension 4:  Readiness to Change:  Dimension 4:  Description of Readiness to Change criteria: n/a  Dimension 5:  Relapse, Continued use, or Continued Problem Potential:  Dimension 5:  Relapse, continued use, or continued problem potential critiera description: n/a  Dimension 6:  Recovery/Living Environment:  Dimension 6:   Recovery/Iiving environment criteria description: n/a  ASAM Severity Score:    ASAM Recommended Level of Treatment: ASAM Recommended Level of Treatment:  (N/A)   Substance use Disorder (SUD) Substance Use Disorder (SUD)  Checklist Symptoms of Substance Use:  (N/A)  Recommendations for Services/Supports/Treatments: Recommendations for Services/Supports/Treatments Recommendations For Services/Supports/Treatments: Individual Therapy, Medication Management (observation)  Discharge Disposition: Discharge Disposition Medical Exam completed: Yes Disposition of Patient: Admit  DSM5 Diagnoses: Patient Active Problem List   Diagnosis Date Noted   Hip pain 10/14/2021   Moderate cannabis use disorder (HCC) 09/30/2021   MDD (major depressive disorder), recurrent episode, severe (HCC) 03/17/2019   Essential hypertension 09/03/2014   Tobacco use disorder 09/03/2014   Substance abuse (HCC)    Mild depression    Cellulitis of nasal tip    Chronic back pain    Cellulitis of nose 08/26/2014   Cellulitis 08/26/2014   MDD (major depressive disorder), recurrent severe, without psychosis (HCC) 06/09/2014   Major depressive disorder, recurrent, severe with psychotic features (HCC) 06/08/2011   Alcohol dependence (HCC) 06/08/2011   Polysubstance abuse (HCC) 05/31/2011     Referrals to Alternative Service(s): Referred to Alternative Service(s):   Place:   Date:   Time:    Referred to Alternative Service(s):   Place:   Date:   Time:    Referred to Alternative Service(s):   Place:   Date:   Time:    Referred to Alternative Service(s):   Place:   Date:   Time:     Burnetta Sabin, [facility]

## 2023-03-23 NOTE — Tx Team (Signed)
Initial Treatment Plan 03/23/2023 3:19 PM [name] [id number]    PATIENT STRESSORS: Financial difficulties   Occupational concerns     PATIENT STRENGTHS: Ability for insight  Active sense of humor  Average or above average intelligence  Capable of independent living  Education administrator  Physical Health    PATIENT IDENTIFIED PROBLEMS:   Depression Financial strain                   DISCHARGE CRITERIA:  Ability to meet basic life and health needs Adequate post-discharge living arrangements Medical problems require only outpatient monitoring Motivation to continue treatment in a less acute level of care Reduction of life-threatening or endangering symptoms to within safe limits  PRELIMINARY DISCHARGE PLAN: Attend PHP/IOP Attend 12-step recovery group Outpatient therapy  PATIENT/FAMILY INVOLVEMENT: This treatment plan has been presented to and reviewed with the patient, [name], and/or family member, .  The patient and family have been given the opportunity to ask questions and make suggestions.  Malva Limes, RN 03/23/2023, 3:19 PM

## 2023-03-23 NOTE — ED Provider Notes (Signed)
Emergency Medicine Observation Re-evaluation Note  [name] is a 52 y.o. male, seen on rounds today.  Pt initially presented to the ED for complaints of Suicidal Currently, the patient is resting comfortably.  Physical Exam  BP (!) 107/56   Pulse 66   Temp 98.1 F (36.7 C)   Resp 16   Ht 5\' 6"  (1.676 m)   Wt 53.5 kg   SpO2 95%   BMI 19.05 kg/m  Physical Exam General: Resting comfortably in stretcher Lungs: Normal work of breathing Psych: Calm  ED Course / MDM  EKG:EKG Interpretation Date/Time:  Tuesday March 23 2023 01:06:41 EST Ventricular Rate:  70 PR Interval:  152 QRS Duration:  90 QT Interval:  418 QTC Calculation: 451 R Axis:   80  Text Interpretation: Normal sinus rhythm Normal ECG When compared with ECG of 22-Mar-2023 22:21, PREVIOUS ECG IS PRESENT Confirmed by Gilda Crease [phone]) on 03/23/2023 1:11:40 AM  I have reviewed the labs performed to date as well as medications administered while in observation.  Recent changes in the last 24 hours include accepted to [facility] by Dr. Sherron Flemings.  Plan  Current plan is for [facility].    Rondel Baton, MD 03/23/23 [phone]

## 2023-03-23 NOTE — ED Notes (Signed)
Pt ambulated to safe transport vehicle w/ steady gait, VSS, calm and cooperative. All paperwork and belongings given to safe transport.

## 2023-03-23 NOTE — [facility] Group Notes (Signed)
[facility] Group Notes:  (Nursing/MHT/Case Management/Adjunct)  Date:  03/23/2023  Time:  8:50 PM  Type of Therapy:  Psychoeducational Skills  Participation Level:  None  Participation Quality:  Resistant  Affect:  Flat and Irritable  Cognitive:  Lacking  Insight:  None  Engagement in Group:  Resistant  Modes of Intervention:  Education  Summary of Progress/Problems: Patient politely refused to share in group, but he did attend.   Hazle Coca S 03/23/2023, 8:50 PM

## 2023-03-23 NOTE — Group Note (Signed)
[facility] LCSW Group Therapy Note   Group Date: 03/23/2023 Start Time: 1100 End Time: 1200   Type of Therapy and Topic: Group Therapy: Avoiding Self-Sabotaging and Enabling Behaviors  Participation Level: Did Not Attend  Mood:  Description of Group:  In this group, patients will learn how to identify obstacles, self-sabotaging and enabling behaviors, as well as: what are they, why do we do them and what needs these behaviors meet. Discuss unhealthy relationships and how to have positive healthy boundaries with those that sabotage and enable. Explore aspects of self-sabotage and enabling in yourself and how to limit these self-destructive behaviors in everyday life.   Therapeutic Goals: 1. Patient will identify one obstacle that relates to self-sabotage and enabling behaviors 2. Patient will identify one personal self-sabotaging or enabling behavior they did prior to admission 3. Patient will state a plan to change the above identified behavior 4. Patient will demonstrate ability to communicate their needs through discussion and/or role play.    Summary of Patient Progress:   Did not attend   Therapeutic Modalities:  Cognitive Behavioral Therapy Person-Centered Therapy Motivational Interviewing    Marinda Elk, [place]

## 2023-03-23 NOTE — Progress Notes (Signed)
Patient ID: [name], male   DOB: April 16, 1971, 52 y.o.   MRN: [id number] Patient admitted to [facility] voluntarily from [facility] . Patient reports worsening depression , stating '' it's been tough because I have lost a lot of my family, this is the anniversary of those loss and then I lost my job because we sort of fell out the guy I was working with and he's been talking in the community bad about me so I can't get work Engineer, petroleum (pt works in Microbiologist) and shares that this has caused him to have financial strains and worry about keeping his lights on and food in the house. Pt reports he was having thoughts of suicide.  He states he has been able to maintain his sobriety and has been sober from drinking 10 years.  Pt also reports poor appetite and weight loss.  Pt oriented to unit.  Pt currently denies any SI . Emotional support and encouragement given. Pt is safe.

## 2023-03-23 NOTE — ED Notes (Signed)
Hat and shirt obtained and added to belongings

## 2023-03-24 ENCOUNTER — Encounter (HOSPITAL_COMMUNITY): Payer: Self-pay

## 2023-03-24 MED ORDER — RISPERIDONE 1 MG PO TBDP
1.0000 mg | ORAL_TABLET | Freq: Every day | ORAL | Status: DC
  Filled 2023-03-24 (×2): qty 1

## 2023-03-24 MED ORDER — ENSURE ENLIVE PO LIQD
237.0000 mL | Freq: Three times a day (TID) | ORAL | Status: DC
  Administered 2023-03-24 – 2023-03-27 (×6): 237 mL via ORAL
  Filled 2023-03-24 (×12): qty 237

## 2023-03-24 MED ORDER — FLUOXETINE HCL 10 MG PO CAPS
10.0000 mg | ORAL_CAPSULE | Freq: Every day | ORAL | Status: DC
  Filled 2023-03-24 (×3): qty 1

## 2023-03-24 NOTE — [facility] Group Notes (Signed)
[facility] Group Notes:  (Nursing/MHT/Case Management/Adjunct)  Date:  03/24/2023  Time:  9:37 PM  Type of Therapy:   NA group  Participation Level:  Active  Participation Quality:  Appropriate  Affect:  Appropriate  Cognitive:  Appropriate  Insight:  Appropriate  Engagement in Group:  Engaged  Modes of Intervention:  Education  Summary of Progress/Problems: Pt attended NA meeting.  [name] 03/24/2023, 9:37 PM

## 2023-03-24 NOTE — Progress Notes (Signed)
   03/24/23 2300  Psych Admission Type (Psych Patients Only)  Admission Status Voluntary  Psychosocial Assessment  Patient Complaints Anxiety;Depression  Eye Contact Fair  Facial Expression Anxious  Affect Preoccupied  Speech Logical/coherent  Interaction Assertive  Motor Activity Slow  Appearance/Hygiene Unremarkable  Behavior Characteristics Cooperative  Mood Depressed;Sad  Aggressive Behavior  Effect No apparent injury  Thought Process  Coherency WDL  Content WDL  Delusions WDL  Perception WDL  Hallucination None reported or observed  Judgment WDL  Confusion WDL  Danger to Self  Current suicidal ideation? Passive  Self-Injurious Behavior Some self-injurious ideation observed or expressed.  No lethal plan expressed   Agreement Not to Harm Self Yes  Description of Agreement verbal contract for safety  Danger to Others  Danger to Others None reported or observed

## 2023-03-24 NOTE — BH IP Treatment Plan (Signed)
Interdisciplinary Treatment and Diagnostic Plan Update  03/24/2023 Time of Session: 11:00 [name] MRN: [id number]  Principal Diagnosis: MDD (major depressive disorder)  Secondary Diagnoses: Principal Problem:   MDD (major depressive disorder)   Current Medications:  Current Facility-Administered Medications  Medication Dose Route Frequency Provider Last Rate Last Admin   alum & mag hydroxide-simeth (MAALOX/MYLANTA) 200-200-20 MG/5ML suspension 30 mL  30 mL Oral Q4H PRN Eligha Bridegroom, NP       haloperidol (HALDOL) tablet 5 mg  5 mg Oral TID PRN Eligha Bridegroom, NP       And   diphenhydrAMINE (BENADRYL) capsule 50 mg  50 mg Oral TID PRN Eligha Bridegroom, NP       haloperidol lactate (HALDOL) injection 5 mg  5 mg Intramuscular TID PRN Eligha Bridegroom, NP       And   diphenhydrAMINE (BENADRYL) injection 50 mg  50 mg Intramuscular TID PRN Eligha Bridegroom, NP       And   LORazepam (ATIVAN) injection 2 mg  2 mg Intramuscular TID PRN Eligha Bridegroom, NP       feeding supplement (ENSURE ENLIVE / ENSURE PLUS) liquid 237 mL  237 mL Oral TID BM Carrion-Carrero, Margely, MD       FLUoxetine (PROZAC) capsule 10 mg  10 mg Oral Daily Carrion-Carrero, Margely, MD       hydrOXYzine (ATARAX) tablet 25 mg  25 mg Oral TID PRN Eligha Bridegroom, NP   25 mg at 03/23/23 2108   magnesium hydroxide (MILK OF MAGNESIA) suspension 30 mL  30 mL Oral Daily PRN Eligha Bridegroom, NP       nicotine (NICODERM CQ - dosed in mg/24 hours) patch 21 mg  21 mg Transdermal Daily Eligha Bridegroom, NP       risperiDONE (RISPERDAL M-TABS) disintegrating tablet 1 mg  1 mg Oral QHS Carrion-Carrero, Margely, MD       traZODone (DESYREL) tablet 50 mg  50 mg Oral QHS PRN Eligha Bridegroom, NP       PTA Medications: Medications Prior to Admission  Medication Sig Dispense Refill Last Dose   feeding supplement (ENSURE ENLIVE / ENSURE PLUS) LIQD Take 237 mLs by mouth 2 (two) times daily between meals. (Patient not  taking: Reported on 03/23/2023) 14220 mL 0    gabapentin (NEURONTIN) 300 MG capsule Take 1 capsule (300 mg total) by mouth 3 (three) times daily. 90 capsule 0    lidocaine (LIDODERM) 5 % Place 1 patch onto the skin daily. Remove & Discard patch within 12 hours or as directed by MD (Patient not taking: Reported on 03/23/2023) 30 patch 0    multivitamin (PROSIGHT) TABS tablet Take 1 tablet by mouth daily. (Patient not taking: Reported on 03/23/2023) 30 tablet 0    nicotine (NICODERM CQ - DOSED IN MG/24 HOURS) 21 mg/24hr patch Place 1 patch (21 mg total) onto the skin daily at 6 (six) AM. 30 patch 0     Patient Stressors: Financial difficulties   Occupational concerns    Patient Strengths: Ability for insight  Active sense of humor  Average or above average intelligence  Capable of independent living  Education administrator  Physical Health   Treatment Modalities: Medication Management, Group therapy, Case management,  1 to 1 session with clinician, Psychoeducation, Recreational therapy.   Physician Treatment Plan for Primary Diagnosis: MDD (major depressive disorder) Long Term Goal(s): Improvement in symptoms so as ready for discharge   Short Term Goals: Ability to identify changes in lifestyle to  reduce recurrence of condition will improve Ability to verbalize feelings will improve  Medication Management: Evaluate patient's response, side effects, and tolerance of medication regimen.  Therapeutic Interventions: 1 to 1 sessions, Unit Group sessions and Medication administration.  Evaluation of Outcomes: Not Progressing  Physician Treatment Plan for Secondary Diagnosis: Principal Problem:   MDD (major depressive disorder)  Long Term Goal(s): Improvement in symptoms so as ready for discharge   Short Term Goals: Ability to identify changes in lifestyle to reduce recurrence of condition will improve Ability to verbalize feelings will improve     Medication  Management: Evaluate patient's response, side effects, and tolerance of medication regimen.  Therapeutic Interventions: 1 to 1 sessions, Unit Group sessions and Medication administration.  Evaluation of Outcomes: Not Progressing   RN Treatment Plan for Primary Diagnosis: MDD (major depressive disorder) Long Term Goal(s): Knowledge of disease and therapeutic regimen to maintain health will improve  Short Term Goals: Ability to remain free from injury will improve, Ability to verbalize frustration and anger appropriately will improve, Ability to demonstrate self-control, Ability to participate in decision making will improve, Ability to verbalize feelings will improve, Ability to disclose and discuss suicidal ideas, Ability to identify and develop effective coping behaviors will improve, and Compliance with prescribed medications will improve  Medication Management: RN will administer medications as ordered by provider, will assess and evaluate patient's response and provide education to patient for prescribed medication. RN will report any adverse and/or side effects to prescribing provider.  Therapeutic Interventions: 1 on 1 counseling sessions, Psychoeducation, Medication administration, Evaluate responses to treatment, Monitor vital signs and CBGs as ordered, Perform/monitor CIWA, COWS, AIMS and Fall Risk screenings as ordered, Perform wound care treatments as ordered.  Evaluation of Outcomes: Not Progressing   LCSW Treatment Plan for Primary Diagnosis: MDD (major depressive disorder) Long Term Goal(s): Safe transition to appropriate next level of care at discharge, Engage patient in therapeutic group addressing interpersonal concerns.  Short Term Goals: Engage patient in aftercare planning with referrals and resources, Increase social support, Increase ability to appropriately verbalize feelings, Increase emotional regulation, Facilitate acceptance of mental health diagnosis and concerns,  Facilitate patient progression through stages of change regarding substance use diagnoses and concerns, Identify triggers associated with mental health/substance abuse issues, and Increase skills for wellness and recovery  Therapeutic Interventions: Assess for all discharge needs, 1 to 1 time with Social worker, Explore available resources and support systems, Assess for adequacy in community support network, Educate family and significant other(s) on suicide prevention, Complete Psychosocial Assessment, Interpersonal group therapy.  Evaluation of Outcomes: Not Progressing   Progress in Treatment: Attending groups: Yes. Participating in groups: Yes. Taking medication as prescribed: No. Toleration medication: No. Family/Significant other contact made: No, will contact:  pending consent Patient understands diagnosis: Yes. Discussing patient identified problems/goals with staff: Yes. Medical problems stabilized or resolved: Yes. Denies suicidal/homicidal ideation: Yes. Issues/concerns per patient self-inventory: No.   New problem(s) identified: No, Describe:  none reported  New Short Term/Long Term Goal(s):   medication stabilization, elimination of SI thoughts, development of comprehensive mental wellness plan.    Patient Goals:  "to get my mind right."  Discharge Plan or Barriers: Patient recently admitted. CSW will continue to follow and assess for appropriate referrals and possible discharge planning.    Reason for Continuation of Hospitalization: Anxiety Depression  Estimated Length of Stay: 5-7 days  Last 3 [place] Suicide Severity Risk Score: Flowsheet Row [facility]  ED from 03/22/2023 in [facility] Admission (Discharged) from 02/18/2023 in [facility] INPAT[facility]INE  C-SSRS RISK CATEGORY Low Risk Low Risk Low Risk       Last PHQ 2/9  Scores:    10/14/2021    8:22 AM 09/29/2021    4:03 AM 09/03/2014    2:22 PM  Depression screen PHQ 2/9  Decreased Interest 1 2 0  Down, Depressed, Hopeless 3 3 0  PHQ - 2 Score 4 5 0  Altered sleeping 2 2   Tired, decreased energy 2 2   Change in appetite 1 2   Feeling bad or failure about yourself  3 3   Trouble concentrating 1 2   Moving slowly or fidgety/restless 0 1   Suicidal thoughts 1 2   PHQ-9 Score 14 19   Difficult doing work/chores Somewhat difficult Very difficult     Scribe for Treatment Team: Marinda Elk, LCSW 03/24/2023 3:01 PM

## 2023-03-24 NOTE — [facility] Suicide Risk Assessment (Signed)
Suicide Risk Assessment  Admission Assessment    [facility] Admission Suicide Risk Assessment   Nursing information obtained from:  Patient, Review of record Demographic factors:  Male, Low socioeconomic status, Living alone Current Mental Status:  Suicidal ideation indicated by patient Loss Factors:  Decrease in vocational status, Financial problems / change in socioeconomic status Historical Factors:  Impulsivity, Anniversary of important loss Risk Reduction Factors:  Positive therapeutic relationship, Positive social support  Total Time spent with patient: 1.5 hours Principal Problem: MDD (major depressive disorder) Diagnosis:  Principal Problem:   MDD (major depressive disorder)   Subjective Data:   CC: "worsening depression with HI"   HPI:    [name] is a 52 y.o. 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  Patient has 6 prior psychiatric hospitalizations identified on chart review. Patient initially arrived to [facility] on 03/22/23 for SI and worsening depression, and admitted to [facility] voluntarily on 03/23/23 for acute safety concerns, acute suicidal or self-harming behaviors, and homicidal behaviors.    Patient reports unspecified weeks of mounting worsening MDD due to financial insecurities, loss of multiple family members (most recently daughter in 2020), and unfaithful partner. Recently he discovered his partner was being unfaithful with his home Software engineer. Although did not elaborate on how he discovered partner and colleague relations. He describes this as his "breaking point" with thoughts of harming his partner and colleague but did not detail any plans. He denies any suicidal ideation during this time and ultimately "packed his bags" to seek help before "acting on his bad thought."   However, on separate  evaluation he endorsed passive SI and paranoid ideations. He has had at least 6 prior admissions at [facility] (most recently 02/18/23) for MDD, SUD, and AH. However, today he denies every experiencing hallucinations despite a 2012 [facility] visit where he endorsed frequently hearing negative voices. He does not like taking medications and usually stops mood stabilizing medications after discharge. Was unsure about starting any medications, but prefers Remeron since he has tried it before with improvement in mood and appetite.    From chart review, he has a well documented hx of lability which was observed today. He fluctuated from crying about loss of loved ones to irritability when felt interrupted. He expressed frustration during a prior [facility] visit where the provider "questioned his marijuana addiction." He denies changes in his sleep and appetite but does desire to gain weight.    Collateral Information: patient did not provide contacts for collateral, says he doesn't have reliable support system. Does not want communication with current partner.     Psychiatric ROS Mood Symptoms Anhedonia, hopelessness, passive SI, anxiety, worthlessness, guilt    Manic Symptoms Lability, irritability documented.  Any history of expansive energy consistent with manic/episode.   Anxiety Symptoms Denies any history of excessive worry or concern.  Denies any history of panic attacks   Trauma Symptoms Denies any history of prior trauma.   Psychosis Symptoms Admits to history of paranoia, suspicious and distrustful of others.     Past Psychiatric Hx: Current Psychiatrist: Current Therapist: Previous Psychiatric Diagnoses:  Current psychiatric medications: Psychiatric medication history/compliance: Psychiatric Hospitalization hx: Psychotherapy hx: Neuromodulation history:  History of suicide (obtained from HPI): History of homicide or aggression (obtained in HPI):   Substance Abuse Hx: Alcohol: sober for ~10  years Tobacco: 0.5 ppd Cannabis: marijuana daily (positive on UDS) Other  Illicit drugs: denies other illicit drug use Rx drug abuse: None reported  Rehab hx: [facility] for AUD, has been to numerous others most recently and successfully at [facility]    Past Medical History: PCP: unknown  Medical Dx: Tobacco use disorder, substance use (marijuana)  Medications: none reported Allergies: Ibuprofen, Acetaminophen, Asprin, Pork-derived Products, sertraline. Unsure of reaction to each medication but endorses N/V, Hives and possible anaphylaxis? Surgeries: None Trauma: Head injury  Seizures: none identified on chart review     Family Medical History: Father passed away from cirrhosis.   Family Psychiatric History: Medical: None reported. Psych: None reported. Psych Rx: None reported. SA/HA: None reported. Substance use family hx: Not asked.     Social History: Childhood: unknown Abuse: None reported. Marital Status: Single Sexual orientation:  Children: 1 living, 1 deceased Employment: Previously worked at group home, Currently home improvement in [facility] Education: unknown Peer Group: Reports having "no support" Housing: Lives alone Finances: Worried due to losing phone with work contacts and child support Legal: No charges, only child Charity fundraiser: None   Access to firearms: Denies  Continued Clinical Symptoms:  Alcohol Use Disorder Identification Test Final Score (AUDIT): 0 The "Alcohol Use Disorders Identification Test", Guidelines for Use in Primary Care, Second Edition.  World Science writer [facility]). Score between 0-7:  no or low risk or alcohol related problems. Score between 8-15:  moderate risk of alcohol related problems. Score between 16-19:  high risk of alcohol related problems. Score 20 or above:  warrants further diagnostic evaluation for alcohol dependence and treatment.   CLINICAL FACTORS:   Unstable or Poor Therapeutic Relationship Previous  Psychiatric Diagnoses and Treatments   Musculoskeletal: Strength & Muscle Tone: within normal limits Gait & Station: normal Patient leans: N/A   Psychiatric Specialty Exam:   Presentation  General Appearance:  Appropriate for Environment   Eye Contact: Good   Speech: Clear and Coherent   Speech Volume: Normal   Handedness: Right     Mood and Affect  Mood: Depressed; Hopeless   Affect: Congruent     Thought Process  Thought Processes: Goal Directed   Descriptions of Associations: Intact   Orientation: Full (Time, Place and Person)   Thought Content: WDL   History of Schizophrenia/Schizoaffective disorder: No   Duration of Psychotic Symptoms:N/A Hallucinations: Hallucinations: None   Ideas of Reference: None   Suicidal Thoughts: Suicidal Thoughts: Yes, Passive SI Passive Intent and/or Plan: Without Intent; Without Plan   Homicidal Thoughts: Homicidal Thoughts: No     Sensorium  Memory: Immediate Good; Recent Good   Judgment: Fair   Insight: Good     Executive Functions  Concentration: Good   Attention Span: Good   Recall: Good   Fund of Knowledge: Good   Language: Good     Psychomotor Activity  Psychomotor Activity: Psychomotor Activity: Normal     Assets  Assets: Desire for Improvement; Physical Health; Resilience; Communication Skills     Sleep  Sleep: Sleep: Fair       Physical Exam: Physical Exam Vitals and nursing note reviewed.  Constitutional:      Appearance: Normal appearance.  HENT:     Head: Normocephalic and atraumatic.  Pulmonary:     Effort: Pulmonary effort is normal. No respiratory distress.  Skin:    General: Skin is warm and dry.      Review of Systems  All other systems reviewed and are negative.   Blood pressure 135/87, pulse (!) 58, temperature 98.4 F (36.9 C),  temperature source Oral, resp. rate 18, height 5\' 7"  (1.702 m), weight 53 kg, SpO2 100%. Body mass index is 18.3  kg/m.   COGNITIVE FEATURES THAT CONTRIBUTE TO RISK:  None    SUICIDE RISK:   Moderate:  Frequent suicidal ideation with limited intensity, and duration, some specificity in terms of plans, no associated intent, good self-control, limited dysphoria/symptomatology, some risk factors present, and identifiable protective factors, including available and accessible social support.  PLAN OF CARE: See H&P for assessment and plan.   I certify that inpatient services furnished can reasonably be expected to improve the patient's condition.   Lorri Frederick, MD 03/24/2023, 7:54 AM

## 2023-03-24 NOTE — H&P (Cosign Needed)
Psychiatric Admission Assessment Adult  Patient Identification: [name] MRN:  [id number] Date of Evaluation:  03/24/2023 Chief Complaint:  MDD (major depressive disorder) [F32.9] Principal Diagnosis: MDD (major depressive disorder) Diagnosis:  Principal Problem:   MDD (major depressive disorder)   CC: "worsening depression with HI"  HPI:   [name] is a 52 y.o. 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  Patient has 6 prior psychiatric hospitalizations identified on chart review. Patient initially arrived to [facility] on 03/22/23 for SI and worsening depression, and admitted to [facility] voluntarily on 03/23/23 for acute safety concerns, acute suicidal or self-harming behaviors, and homicidal behaviors.   Patient reports unspecified weeks of mounting worsening MDD due to financial insecurities, loss of multiple family members (most recently daughter in 2020), and unfaithful partner. Recently he discovered his partner was being unfaithful with his home Software engineer. Although did not elaborate on how he discovered partner and colleague relations. He describes this as his "breaking point" with thoughts of harming his partner and colleague but did not detail any plans. He denies any suicidal ideation during this time and ultimately "packed his bags" to seek help before "acting on his bad thought."  However, on separate evaluation he endorsed passive SI and paranoid ideations. He has had at least 6 prior admissions at [name] (most recently 02/18/23) for MDD, SUD, and AH. However, today he denies every experiencing hallucinations despite a 2012 [facility] visit where he endorsed frequently hearing negative voices. He does not like taking medications and usually stops mood stabilizing medications after discharge. Was unsure about starting any  medications, but prefers Remeron since he has tried it before with improvement in mood and appetite.   From chart review, he has a well documented hx of lability which was observed today. He fluctuated from crying about loss of loved ones to irritability when felt interrupted. He expressed frustration during a prior [facility] visit where the provider "questioned his marijuana addiction." He denies changes in his sleep and appetite but does desire to gain weight.   Collateral Information: patient did not provide contacts for collateral, says he doesn't have reliable support system. Does not want communication with current partner.   Psychiatric ROS Mood Symptoms Anhedonia, hopelessness, passive SI, anxiety, worthlessness, guilt   Manic Symptoms Lability, irritability documented.  Any history of expansive energy consistent with manic/episode.  Anxiety Symptoms Denies any history of excessive worry or concern.  Denies any history of panic attacks  Trauma Symptoms Denies any history of prior trauma.  Psychosis Symptoms Admits to history of paranoia, suspicious and distrustful of others.   Past Psychiatric Hx: Current Psychiatrist: Current Therapist: Previous Psychiatric Diagnoses:  Current psychiatric medications: Psychiatric medication history/compliance: Psychiatric Hospitalization hx: Psychotherapy hx: Neuromodulation history:  History of suicide (obtained from HPI): History of homicide or aggression (obtained in HPI):  Substance Abuse Hx: Alcohol: sober for ~10 years Tobacco: 0.5 ppd Cannabis: marijuana daily (positive on UDS) Other Illicit drugs: denies other illicit drug use Rx drug abuse: None reported  Rehab hx: [facility] for AUD, has been to numerous others most recently and successfully at [facility]   Past Medical History: PCP: unknown  Medical Dx: Tobacco use disorder, substance use (marijuana)  Medications: none reported Allergies: Ibuprofen, Acetaminophen, Asprin,  Pork-derived Products, sertraline. Unsure of reaction to each medication but endorses N/V, Hives and possible anaphylaxis? Surgeries: None Trauma: Head injury  Seizures: none  identified on chart review   Family Medical History: Father passed away from cirrhosis.  Family Psychiatric History: Medical: None reported. Psych: None reported. Psych Rx: None reported. SA/HA: None reported. Substance use family hx: Not asked.    Social History: Childhood: unknown Abuse: None reported. Marital Status: Single Sexual orientation:  Children: 1 living, 1 deceased Employment: Previously worked at group home, Currently home improvement in [facility] Education: unknown Peer Group: Reports having "no support" Housing: Lives alone Finances: Worried due to losing phone with work contacts and child support Legal: No charges, only child Charity fundraiser: None  Access to firearms: Denies   Total Time spent with patient: 1.5 hours   [place] Scale:  Flowsheet Row [facility] ([facility]2023 [facility] at [facility] Admission (Discharged) from 02/18/2023 in [facility] INPATIENT BEH[facility]AL MEDICINE  C-SSRS RISK CATEGORY Low Risk Low Risk Low Risk        Tobacco Screening:  Social History   Tobacco Use  Smoking Status Every Day   Current packs/day: 0.25   Types: Cigarettes  Smokeless Tobacco Never    BH Tobacco Counseling     Are you interested in Tobacco Cessation Medications?  No, patient refused Counseled patient on smoking cessation:  Refused/Declined practical counseling Reason Tobacco Screening Not Completed: Patient Refused Screening       Social History:  Social History   Substance and Sexual Activity  Alcohol Use No   Comment: former  last use 5+ year ago     Social History   Substance and Sexual Activity  Drug Use Yes   Frequency: 5.0 times per week    Types: Marijuana   Comment: 10 years 11/03    Additional Social History:                           Allergies:   Allergies  Allergen Reactions   Acetaminophen Nausea Only   Aspirin Hives and Nausea And Vomiting   Ibuprofen Hives and Nausea And Vomiting   Pork-Derived Products Other (See Comments)    Stomach pain. Pt does not eat pork   Sertraline Diarrhea and Other (See Comments)    Acute GI distress with diarrhea and pain.    Lab Results:  Results for orders placed or performed during the hospital encounter of 03/22/23 (from the past 48 hour(s))  Comprehensive metabolic panel     Status: Abnormal   Collection Time: 03/22/23 11:06 PM  Result Value Ref Range   Sodium 140 135 - 145 mmol/L   Potassium 3.8 3.5 - 5.1 mmol/L   Chloride 105 98 - 111 mmol/L   CO2 28 22 - 32 mmol/L   Glucose, Bld 89 70 - 99 mg/dL    Comment: Glucose reference range applies only to samples taken after fasting for at least 8 hours.   BUN 11 6 - 20 mg/dL   Creatinine, Ser 1.61 0.61 - 1.24 mg/dL   Calcium 9.7 8.9 - 09.6 mg/dL   Total Protein 6.9 6.5 - 8.1 g/dL   Albumin 4.2 3.5 - 5.0 g/dL   AST 16 15 - 41 U/L   ALT 9 0 - 44 U/L   Alkaline Phosphatase 34 (L) 38 - 126 U/L   Total Bilirubin 0.7 <1.2 mg/dL   GFR, Estimated >04 >54 mL/min    Comment: (NOTE) Calculated using the CKD-EPI Creatinine Equation (2021)    Anion gap  7 5 - 15    Comment: Performed at [facility], [address], [place], [place] [address]  Ethanol     Status: None   Collection Time: 03/22/23 11:06 PM  Result Value Ref Range   Alcohol, Ethyl (B) <10 <10 mg/dL    Comment: (NOTE) Lowest detectable limit for serum alcohol is 10 mg/dL.  For medical purposes only. Performed at [facility], [address], [place], [place] [address]   Salicylate level     Status: Abnormal   Collection Time: 03/22/23 11:06 PM  Result Value Ref Range   Salicylate Lvl <7.0 (L) 7.0 - 30.0 mg/dL    Comment: Performed at  [facility], [address], [place], [place] [address]  Acetaminophen level     Status: Abnormal   Collection Time: 03/22/23 11:06 PM  Result Value Ref Range   Acetaminophen (Tylenol), Serum <10 (L) 10 - 30 ug/mL    Comment: (NOTE) Therapeutic concentrations vary significantly. A range of 10-30 ug/mL  may be an effective concentration for many patients. However, some  are best treated at concentrations outside of this range. Acetaminophen concentrations >150 ug/mL at 4 hours after ingestion  and >50 ug/mL at 12 hours after ingestion are often associated with  toxic reactions.  Performed at [facility], [address] [place], [place] [address]   cbc     Status: Abnormal   Collection Time: 03/22/23 11:06 PM  Result Value Ref Range   WBC 7.0 4.0 - 10.5 K/uL   RBC 4.04 (L) 4.22 - 5.81 MIL/uL   Hemoglobin 12.5 (L) 13.0 - 17.0 g/dL   HCT 65.7 (L) 84.6 - 96.2 %   MCV 95.8 80.0 - 100.0 fL   MCH 30.9 26.0 - 34.0 pg   MCHC 32.3 30.0 - 36.0 g/dL   RDW 95.2 84.1 - 32.4 %   Platelets 231 150 - 400 K/uL   nRBC 0.0 0.0 - 0.2 %    Comment: Performed at [facility], [address] [place], [place] [address]  Rapid urine drug screen (hospital performed)     Status: Abnormal   Collection Time: 03/22/23 11:37 PM  Result Value Ref Range   Opiates NONE DETECTED NONE DETECTED   Cocaine NONE DETECTED NONE DETECTED   Benzodiazepines NONE DETECTED NONE DETECTED   Amphetamines NONE DETECTED NONE DETECTED   Tetrahydrocannabinol POSITIVE (A) NONE DETECTED   Barbiturates NONE DETECTED NONE DETECTED    Comment: (NOTE) DRUG SCREEN FOR MEDICAL PURPOSES ONLY.  IF CONFIRMATION IS NEEDED FOR ANY PURPOSE, NOTIFY LAB WITHIN 5 DAYS.  LOWEST DETECTABLE LIMITS FOR URINE DRUG SCREEN Drug Class                     Cutoff (ng/mL) Amphetamine and metabolites    1000 Barbiturate and metabolites    200 Benzodiazepine                 200 Opiates and metabolites        300 Cocaine and  metabolites        300 THC                            50 Performed at [facility], [address] [place], [place] [address]     Blood Alcohol level:  Lab Results  Component Value Date   ETH <10 03/22/2023   ETH <  10 02/17/2023    Metabolic Disorder Labs:  Lab Results  Component Value Date   HGBA1C 5.2 08/27/2014   MPG 103 08/27/2014   MPG 105 03/08/2013   No results found for: "PROLACTIN" No results found for: "CHOL", "TRIG", "HDL", "CHOLHDL", "VLDL", "LDLCALC"  Current Medications: Current Facility-Administered Medications  Medication Dose Route Frequency Provider Last Rate Last Admin   alum & mag hydroxide-simeth (MAALOX/MYLANTA) 200-200-20 MG/5ML suspension 30 mL  30 mL Oral Q4H PRN Eligha Bridegroom, NP       haloperidol (HALDOL) tablet 5 mg  5 mg Oral TID PRN Eligha Bridegroom, NP       And   diphenhydrAMINE (BENADRYL) capsule 50 mg  50 mg Oral TID PRN Eligha Bridegroom, NP       haloperidol lactate (HALDOL) injection 5 mg  5 mg Intramuscular TID PRN Eligha Bridegroom, NP       And   diphenhydrAMINE (BENADRYL) injection 50 mg  50 mg Intramuscular TID PRN Eligha Bridegroom, NP       And   LORazepam (ATIVAN) injection 2 mg  2 mg Intramuscular TID PRN Eligha Bridegroom, NP       gabapentin (NEURONTIN) capsule 300 mg  300 mg Oral TID Eligha Bridegroom, NP   300 mg at 03/23/23 1632   hydrOXYzine (ATARAX) tablet 25 mg  25 mg Oral TID PRN Eligha Bridegroom, NP   25 mg at 03/23/23 2108   magnesium hydroxide (MILK OF MAGNESIA) suspension 30 mL  30 mL Oral Daily PRN Eligha Bridegroom, NP       mirtazapine (REMERON) tablet 15 mg  15 mg Oral QHS Eligha Bridegroom, NP   15 mg at 03/23/23 2108   nicotine (NICODERM CQ - dosed in mg/24 hours) patch 21 mg  21 mg Transdermal Daily Eligha Bridegroom, NP       traZODone (DESYREL) tablet 50 mg  50 mg Oral QHS PRN Eligha Bridegroom, NP       PTA Medications: Medications Prior to Admission  Medication Sig Dispense Refill Last Dose   feeding  supplement (ENSURE ENLIVE / ENSURE PLUS) LIQD Take 237 mLs by mouth 2 (two) times daily between meals. (Patient not taking: Reported on 03/23/2023) 14220 mL 0    gabapentin (NEURONTIN) 300 MG capsule Take 1 capsule (300 mg total) by mouth 3 (three) times daily. 90 capsule 0    lidocaine (LIDODERM) 5 % Place 1 patch onto the skin daily. Remove & Discard patch within 12 hours or as directed by MD (Patient not taking: Reported on 03/23/2023) 30 patch 0    multivitamin (PROSIGHT) TABS tablet Take 1 tablet by mouth daily. (Patient not taking: Reported on 03/23/2023) 30 tablet 0    nicotine (NICODERM CQ - DOSED IN MG/24 HOURS) 21 mg/24hr patch Place 1 patch (21 mg total) onto the skin daily at 6 (six) AM. 30 patch 0     Musculoskeletal: Strength & Muscle Tone: within normal limits Gait & Station: normal Patient leans: N/A  Psychiatric Specialty Exam:  Presentation  General Appearance:  Appropriate for Environment  Eye Contact: Good  Speech: Clear and Coherent  Speech Volume: Normal  Handedness: Right   Mood and Affect  Mood: Depressed; Hopeless  Affect: Congruent   Thought Process  Thought Processes: Goal Directed  Descriptions of Associations: Intact  Orientation: Full (Time, Place and Person)  Thought Content: WDL  History of Schizophrenia/Schizoaffective disorder: No  Duration of Psychotic Symptoms:N/A Hallucinations: Hallucinations: None  Ideas of Reference: None  Suicidal Thoughts: Suicidal Thoughts: Yes,  Passive SI Passive Intent and/or Plan: Without Intent; Without Plan  Homicidal Thoughts: Homicidal Thoughts: No   Sensorium  Memory: Immediate Good; Recent Good  Judgment: Fair  Insight: Good   Executive Functions  Concentration: Good  Attention Span: Good  Recall: Good  Fund of Knowledge: Good  Language: Good   Psychomotor Activity  Psychomotor Activity: Psychomotor Activity: Normal   Assets  Assets: Desire for  Improvement; Physical Health; Resilience; Communication Skills   Sleep  Sleep: Sleep: Fair    Physical Exam: Physical Exam Vitals and nursing note reviewed.  Constitutional:      Appearance: Normal appearance.  HENT:     Head: Normocephalic and atraumatic.  Pulmonary:     Effort: Pulmonary effort is normal. No respiratory distress.  Skin:    General: Skin is warm and dry.    Review of Systems  All other systems reviewed and are negative.  Blood pressure 135/87, pulse (!) 58, temperature 98.4 F (36.9 C), temperature source Oral, resp. rate 18, height 5\' 7"  (1.702 m), weight 53 kg, SpO2 100%. Body mass index is 18.3 kg/m.   Treatment Plan Summary: Daily contact with patient to assess and evaluate symptoms and progress in treatment and Medication management   ASSESSMENT: [name] is a 52 y.o. 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  Patient has 6 prior psychiatric hospitalizations identified on chart review. Patient initially arrived to [facility] on 03/22/23 for SI and worsening depression, and admitted to [facility] voluntarily on 03/23/23 for acute safety concerns, acute suicidal or self-harming behaviors, and homicidal behaviors.    Diagnoses / Active Problems: MDD with psychotic features vs substance induced psychosis versus primary psychotic disorder Cannabis Use Disorder Tobacco Use Disorder Alcohol use disorder,    PLAN: Safety and Monitoring:  --  VOLUNTARY  admission to inpatient psychiatric unit for safety, stabilization and treatment  -- Daily contact with patient to assess and evaluate symptoms and progress in treatment  -- Patient's case to be discussed in multi-disciplinary team meeting  -- Observation Level : q15 minute checks  -- Vital signs:  q12 hours  -- Precautions: suicide, elopement, and  assault  2. Psychiatric Diagnoses and Treatment:  Start Prozac 10 mg daily for symptoms of depression Start Risperdal 1 mg nightly for paranoia and agitation -- The risks/benefits/side-effects/alternatives to this medication were discussed in detail with the patient and time was given for questions. The patient consents to medication trial.              -- Metabolic profile and EKG monitoring obtained while on an  antipsychotic  BMI: 18.3 kg/m TSH: pending Lipid Panel: pending HbgA1c: pending EKG on 03/22/2023 showing QTc 425             -- Encouraged patient to participate in unit milieu and in scheduled group therapies   -- Short Term Goals: Ability to identify changes in lifestyle to reduce recurrence of condition will improve and Ability to verbalize feelings will improve  -- Long Term Goals: Improvement in symptoms so as ready for discharge Other PRNS:  Maalox Mylanta every 4 hours as needed Atarax 25 mg 3 times daily as needed Medical magnesia daily as needed Trazodone 50 mg nightly as needed Agitation protocol Haldol, Benadryl, Ativan)   Other labs reviewed on admission:  CMP unremarkable CBC showing normocytic anemia (Hgb 12.5, MCV 95.8) Ethanol <10 Salicylate and  acetaminophen levels negative for toxicity UDS positive for THC    3. Medical Issues Being Addressed:   #Tobacco Use Disorder  Nicotine patch 21mg /24 hours ordered  Smoking cessation encouraged  # Normocytic anemia Ferritin level ordered-pending  4. Discharge Planning:   -- Social work and case management to assist with discharge planning and identification of hospital follow-up needs prior to discharge  -- Estimated Discharge Date: TBD  -- Discharge Concerns: Need to establish a safety plan; Medication compliance and effectiveness  -- Discharge Goals: Return home with outpatient referrals for mental health follow-up including medication management/psychotherapy   I certify that inpatient services  furnished can reasonably be expected to improve the patient's condition.   This note was created using a voice recognition software as a result there may be grammatical errors inadvertently enclosed that do not reflect the nature of this encounter. Every attempt is made to correct such errors.   Signed: Supradeep Madduri MS3  I personally was present and performed or re-performed the history, physical exam and medical decision-making activities of this service and have verified that the service and findings are accurately documented in the student's note.   Signed: Dr. Liston Alba, MD PGY-2, Psychiatry Residency  11/20/202410:05 AM

## 2023-03-24 NOTE — Progress Notes (Signed)
Patient refused prescribed Prozac. Patient asked what other medication changes were made. RN told patient that Remeron was discontinued and Risperdal was started at bedtime. Patient stated "I will not be taking Risperdal the Remeron worked fine". MD made aware.

## 2023-03-24 NOTE — Group Note (Signed)
Recreation Therapy Group Note   Group Topic:Team Building  Group Date: 03/24/2023 Start Time: 0935 End Time: 1002 Facilitators: Jaykwon Morones-McCall, LRT,CTRS Location: [address] Dayroom   Group Topic: Communication, Team Building, Problem Solving  Goal Area(s) Addresses:  Patient will effectively work with peer towards shared goal.  Patient will identify skills used to make activity successful.  Patient will identify how skills used during activity can be applied to reach post d/c goals.   Intervention: STEM Activity- Glass blower/designer  Group Description: Tallest Pharmacist, community. In teams of 5-6, patients were given 11 craft pipe cleaners. Using the materials provided, patients were instructed to compete again the opposing team(s) to build the tallest free-standing structure from floor level. The activity was timed; difficulty increased by Clinical research associate as Production designer, theatre/television/film continued.  Systematically resources were removed with additional directions for example, placing one arm behind their back, working in silence, and shape stipulations. LRT facilitated post-activity discussion reviewing team processes and necessary communication skills involved in completion. Patients were encouraged to reflect how the skills utilized, or not utilized, in this activity can be incorporated to positively impact support systems post discharge.  Education: Pharmacist, community, Scientist, physiological, Discharge Planning   Education Outcome: Acknowledges education/In group clarification offered/Needs additional education.    Affect/Mood: Appropriate   Participation Level: None   Participation Quality: None   Behavior: On-looking   Speech/Thought Process: None   Insight: None   Judgement: None   Modes of Intervention: STEM Activity   Patient Response to Interventions:  Disengaged   Education Outcome:  In group clarification offered    Clinical Observations/Individualized Feedback: Pt didn't  participate in activity. Pt observed as peers completed activity.    Plan: Continue to engage patient in RT group sessions 2-3x/week.   [name], LRT,CTRS 03/24/2023 11:49 AM

## 2023-03-24 NOTE — Progress Notes (Signed)
Pt refused HS Risperdal, pt stated he was under the impression that he was supposed to take a different medication, pt was encouraged to discuss that with the doctor tomorrow.

## 2023-03-24 NOTE — Progress Notes (Signed)
Pt denied SI/HI/AVH this morning. Pt endorsed 'high" anxiety and depression reporting "my mind is all over the place. I am anxious and agitated". Pt complains of chronic mid-lower back pain and rates it a 8/10. Pt refused prescribed gabapentin reporting "it does not work I need something different", MD notified. Pt has been cooperative throughout the shift. RN provided support and encouragement to patient. Pt given scheduled medications as prescribed. Q15 min checks verified for safety. Patient verbally contracts for safety. Pt is safe on the unit.   03/24/23 0900  Psych Admission Type (Psych Patients Only)  Admission Status Voluntary  Psychosocial Assessment  Patient Complaints Anxiety;Depression;Sleep disturbance  Eye Contact Fair  Facial Expression Flat;Sad  Affect Depressed;Sad  Speech Slow  Interaction Minimal  Motor Activity Slow  Appearance/Hygiene Unremarkable  Behavior Characteristics Cooperative;Irritable  Mood Depressed;Sad  Thought Process  Coherency Circumstantial  Content Blaming others  Delusions WDL  Perception WDL  Hallucination None reported or observed  Judgment Impaired  Confusion None  Danger to Self  Current suicidal ideation? Denies  Self-Injurious Behavior No self-injurious ideation or behavior indicators observed or expressed   Agreement Not to Harm Self Yes  Description of Agreement Verbal  Danger to Others  Danger to Others None reported or observed

## 2023-03-24 NOTE — BH IP Treatment Plan (Deleted)
Interdisciplinary Treatment and Diagnostic Plan Update  03/24/2023 Time of Session: 11:00 [name] MRN: [id number]  Principal Diagnosis: MDD (major depressive disorder)  Secondary Diagnoses: Principal Problem:   MDD (major depressive disorder)   Current Medications:  Current Facility-Administered Medications  Medication Dose Route Frequency Provider Last Rate Last Admin   alum & mag hydroxide-simeth (MAALOX/MYLANTA) 200-200-20 MG/5ML suspension 30 mL  30 mL Oral Q4H PRN Eligha Bridegroom, NP       haloperidol (HALDOL) tablet 5 mg  5 mg Oral TID PRN Eligha Bridegroom, NP       And   diphenhydrAMINE (BENADRYL) capsule 50 mg  50 mg Oral TID PRN Eligha Bridegroom, NP       haloperidol lactate (HALDOL) injection 5 mg  5 mg Intramuscular TID PRN Eligha Bridegroom, NP       And   diphenhydrAMINE (BENADRYL) injection 50 mg  50 mg Intramuscular TID PRN Eligha Bridegroom, NP       And   LORazepam (ATIVAN) injection 2 mg  2 mg Intramuscular TID PRN Eligha Bridegroom, NP       feeding supplement (ENSURE ENLIVE / ENSURE PLUS) liquid 237 mL  237 mL Oral TID BM Carrion-Carrero, Margely, MD       FLUoxetine (PROZAC) capsule 10 mg  10 mg Oral Daily Carrion-Carrero, Margely, MD       hydrOXYzine (ATARAX) tablet 25 mg  25 mg Oral TID PRN Eligha Bridegroom, NP   25 mg at 03/23/23 2108   magnesium hydroxide (MILK OF MAGNESIA) suspension 30 mL  30 mL Oral Daily PRN Eligha Bridegroom, NP       nicotine (NICODERM CQ - dosed in mg/24 hours) patch 21 mg  21 mg Transdermal Daily Eligha Bridegroom, NP       risperiDONE (RISPERDAL M-TABS) disintegrating tablet 1 mg  1 mg Oral QHS Carrion-Carrero, Margely, MD       traZODone (DESYREL) tablet 50 mg  50 mg Oral QHS PRN Eligha Bridegroom, NP       PTA Medications: Medications Prior to Admission  Medication Sig Dispense Refill Last Dose   feeding supplement (ENSURE ENLIVE / ENSURE PLUS) LIQD Take 237 mLs by mouth 2 (two) times daily between meals. (Patient not  taking: Reported on 03/23/2023) 14220 mL 0    gabapentin (NEURONTIN) 300 MG capsule Take 1 capsule (300 mg total) by mouth 3 (three) times daily. 90 capsule 0    lidocaine (LIDODERM) 5 % Place 1 patch onto the skin daily. Remove & Discard patch within 12 hours or as directed by MD (Patient not taking: Reported on 03/23/2023) 30 patch 0    multivitamin (PROSIGHT) TABS tablet Take 1 tablet by mouth daily. (Patient not taking: Reported on 03/23/2023) 30 tablet 0    nicotine (NICODERM CQ - DOSED IN MG/24 HOURS) 21 mg/24hr patch Place 1 patch (21 mg total) onto the skin daily at 6 (six) AM. 30 patch 0     Patient Stressors: Financial difficulties   Occupational concerns    Patient Strengths: Ability for insight  Active sense of humor  Average or above average intelligence  Capable of independent living  Education administrator  Physical Health   Treatment Modalities: Medication Management, Group therapy, Case management,  1 to 1 session with clinician, Psychoeducation, Recreational therapy.   Physician Treatment Plan for Primary Diagnosis: MDD (major depressive disorder) Long Term Goal(s): Improvement in symptoms so as ready for discharge   Short Term Goals: Ability to identify changes in lifestyle to  reduce recurrence of condition will improve Ability to verbalize feelings will improve  Medication Management: Evaluate patient's response, side effects, and tolerance of medication regimen.  Therapeutic Interventions: 1 to 1 sessions, Unit Group sessions and Medication administration.  Evaluation of Outcomes: Not Progressing  Physician Treatment Plan for Secondary Diagnosis: Principal Problem:   MDD (major depressive disorder)  Long Term Goal(s): Improvement in symptoms so as ready for discharge   Short Term Goals: Ability to identify changes in lifestyle to reduce recurrence of condition will improve Ability to verbalize feelings will improve     Medication  Management: Evaluate patient's response, side effects, and tolerance of medication regimen.  Therapeutic Interventions: 1 to 1 sessions, Unit Group sessions and Medication administration.  Evaluation of Outcomes: Not Progressing   RN Treatment Plan for Primary Diagnosis: MDD (major depressive disorder) Long Term Goal(s): Knowledge of disease and therapeutic regimen to maintain health will improve  Short Term Goals: {[facility] RN Tx Plan Short Term ZOXWR:60454098}  Medication Management: RN will administer medications as ordered by provider, will assess and evaluate patient's response and provide education to patient for prescribed medication. RN will report any adverse and/or side effects to prescribing provider.  Therapeutic Interventions: 1 on 1 counseling sessions, Psychoeducation, Medication administration, Evaluate responses to treatment, Monitor vital signs and CBGs as ordered, Perform/monitor CIWA, COWS, AIMS and Fall Risk screenings as ordered, Perform wound care treatments as ordered.  Evaluation of Outcomes: {[facility] Tx Plan Outcomes:30414004}   LCSW Treatment Plan for Primary Diagnosis: MDD (major depressive disorder) Long Term Goal(s): Safe transition to appropriate next level of care at discharge, Engage patient in therapeutic group addressing interpersonal concerns.  Short Term Goals: {[facility] LCSW [place] PLAN SHORT TERM GOALS:30414010::"Engage patient in aftercare planning with referrals and resources"}  Therapeutic Interventions: Assess for all discharge needs, 1 to 1 time with Social worker, Explore available resources and support systems, Assess for adequacy in community support network, Educate family and significant other(s) on suicide prevention, Complete Psychosocial Assessment, Interpersonal group therapy.  Evaluation of Outcomes: {[facility] Tx Plan Outcomes:30414004}   Progress in Treatment: Attending groups: {[facility] ADULT:22608} Participating in groups: {[facility] ADULT:22608} Taking  medication as prescribed: {[facility] ADULT:22608} Toleration medication: {[facility] ADULT:22608} Family/Significant other contact made: {YES/NO/CONTACT:22665} Patient understands diagnosis: {[facility] JXBJY:78295} Discussing patient identified problems/goals with staff: {[facility] AOZHY:86578} Medical problems stabilized or resolved: {[facility] ADULT:22608} Denies suicidal/homicidal ideation: {[facility] ADULT:22608} Issues/concerns per patient self-inventory: {[facility] ADULT:22608} Other: ***  New problem(s) identified: {[facility] NEW PROBLEMS:22609}  New Short Term/Long Term Goal(s):  Patient Goals:    Discharge Plan or Barriers:   Reason for Continuation of Hospitalization: {[facility] Reasons for continued hospitalization:22604}  Estimated Length of Stay:  Last 3 [place] Suicide Severity Risk Score: Flowsheet Row [facility]2023 [facility] Admission (Discharged) from 02/18/2023 in [facility]  C-SSRS RISK CATEGORY Low Risk Low Risk Low Risk       Last PHQ 2/9 Scores:    10/14/2021    8:22 AM 09/29/2021    4:03 AM 09/03/2014    2:22 PM  Depression screen PHQ 2/9  Decreased Interest 1 2 0  Down, Depressed, Hopeless 3 3 0  PHQ - 2 Score 4 5 0  Altered sleeping 2 2   Tired, decreased energy 2 2   Change in appetite 1 2   Feeling bad or failure about yourself  3 3   Trouble concentrating 1 2  Moving slowly or fidgety/restless 0 1   Suicidal thoughts 1 2   PHQ-9 Score 14 19   Difficult doing work/chores Somewhat difficult Very difficult     Scribe for Treatment Team: Marinda Elk, [place] 03/24/2023 2:59 PM

## 2023-03-24 NOTE — Plan of Care (Signed)
  Problem: Education: Goal: Emotional status will improve Outcome: Progressing   Problem: Activity: Goal: Interest or engagement in activities will improve Outcome: Progressing Goal: Sleeping patterns will improve Outcome: Progressing

## 2023-03-24 NOTE — Plan of Care (Signed)
  Problem: Education: Goal: Emotional status will improve Outcome: Progressing Goal: Mental status will improve Outcome: Progressing   

## 2023-03-25 LAB — FERRITIN: Ferritin: 156 ng/mL (ref 24–336)

## 2023-03-25 MED ORDER — MIRTAZAPINE 15 MG PO TBDP
15.0000 mg | ORAL_TABLET | Freq: Every day | ORAL | Status: DC
  Administered 2023-03-25 – 2023-03-26 (×2): 15 mg via ORAL
  Filled 2023-03-25 (×3): qty 1

## 2023-03-25 NOTE — Group Note (Signed)
Date:  03/25/2023 Time:  4:22 PM  Group Topic/Focus:  Coping With Mental Health Crisis:   The purpose of this group is to help patients identify strategies for coping with mental health crisis.     Participation Level:  Minimal  Participation Quality:  Inattentive  Affect:  Appropriate  Cognitive:  Appropriate  Insight: Appropriate  Engagement in Group:  Engaged  Modes of Intervention:  Education  Additional Comments:    Arnoldo Hooker 03/25/2023, 4:22 PM

## 2023-03-25 NOTE — Plan of Care (Signed)
  Problem: Education: Goal: Emotional status will improve Outcome: Progressing Goal: Mental status will improve Outcome: Progressing   Problem: Activity: Goal: Interest or engagement in activities will improve Outcome: Progressing Goal: Sleeping patterns will improve Outcome: Progressing   Problem: Safety: Goal: Periods of time without injury will increase Outcome: Progressing   

## 2023-03-25 NOTE — Group Note (Signed)
Date:  03/25/2023 Time:  10:35 AM  Group Topic/Focus:  Goals Group:   The focus of this group is to help patients establish daily goals to achieve during treatment and discuss how the patient can incorporate goal setting into their daily lives to aide in recovery. Orientation:   The focus of this group is to educate the patient on the purpose and policies of crisis stabilization and provide a format to answer questions about their admission.  The group details unit policies and expectations of patients while admitted.    Participation Level:  Active  Participation Quality:  Appropriate  Affect:  Appropriate  Cognitive:  Appropriate  Insight: Appropriate  Engagement in Group:  Engaged  Modes of Intervention:  Education  Additional Comments:    Warden Buffa D Sayeed Weatherall 03/25/2023, 10:35 AM

## 2023-03-25 NOTE — Progress Notes (Signed)
   03/25/23 2045  Psych Admission Type (Psych Patients Only)  Admission Status Voluntary  Psychosocial Assessment  Patient Complaints Suspiciousness  Eye Contact Fair  Facial Expression Anxious  Affect Preoccupied  Speech Logical/coherent  Interaction Assertive  Motor Activity Slow  Appearance/Hygiene Unremarkable  Behavior Characteristics Cooperative  Mood Depressed  Aggressive Behavior  Effect No apparent injury  Thought Process  Coherency WDL  Content WDL  Delusions WDL  Perception WDL  Hallucination None reported or observed  Judgment WDL  Confusion WDL  Danger to Self  Current suicidal ideation? Passive  Self-Injurious Behavior Some self-injurious ideation observed or expressed.  No lethal plan expressed   Agreement Not to Harm Self Yes  Description of Agreement verbal contract for safety  Danger to Others  Danger to Others None reported or observed

## 2023-03-25 NOTE — Group Note (Signed)
LCSW Group Therapy Note   Group Date: 03/25/2023 Start Time: 1100 End Time: 1200   Type of Therapy and Topic:  Group Therapy: Boundaries  Participation Level:  Active  Description of Group: This group will address the use of boundaries in their personal lives. Patients will explore why boundaries are important, the difference between healthy and unhealthy boundaries, and negative and postive outcomes of different boundaries and will look at how boundaries can be crossed.  Patients will be encouraged to identify current boundaries in their own lives and identify what kind of boundary is being set. Facilitators will guide patients in utilizing problem-solving interventions to address and correct types boundaries being used and to address when no boundary is being used. Understanding and applying boundaries will be explored and addressed for obtaining and maintaining a balanced life. Patients will be encouraged to explore ways to assertively make their boundaries and needs known to significant others in their lives, using other group members and facilitator for role play, support, and feedback.  Therapeutic Goals:  1.  Patient will identify areas in their life where setting clear boundaries could be  used to improve their life.  2.  Patient will identify signs/triggers that a boundary is not being respected. 3.  Patient will identify two ways to set boundaries in order to achieve balance in  their lives: 4.  Patient will demonstrate ability to communicate their needs and set boundaries  through discussion and/or role plays  Summary of Patient Progress:  [name] was present/active throughout the session and proved open to feedback from CSW and peers. Patient demonstrated positive insight into the subject matter, was respectful of peers, and was present throughout the entire session.  Therapeutic Modalities:   Cognitive Behavioral Therapy Solution-Focused Therapy  Kathi Der, LCSWA 03/25/2023   12:10 PM

## 2023-03-25 NOTE — Progress Notes (Signed)
[facility] MD Progress Note Patient Identification: [name] MRN:  [id number] Date of Evaluation:  03/25/2023 Chief Complaint:  MDD (major depressive disorder) [F32.9] Principal Diagnosis: MDD (major depressive disorder) Diagnosis:  Principal Problem:   MDD (major depressive disorder)   Principal Problem: MDD (major depressive disorder), recurrent episode, severe (HCC) Diagnosis: Principal Problem:   MDD (major depressive disorder), recurrent episode, severe (HCC)  Total Time spent with patient: 30 minutes  [facility]  [name] is a 52 y.o. 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  Patient has 6 prior psychiatric hospitalizations identified on chart review. Patient initially arrived to [facility] on 03/22/23 for SI and worsening depression, and admitted to [facility] voluntarily on 03/23/23 for acute safety concerns, acute suicidal or self-harming behaviors, and homicidal behaviors.   Subjective:  Patient evaluated on the unit with attending and resident. [name] endorses improved mood this morning. 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 He is unsure if HI would resurface in the future, especially if he encounters partner and colleague.   He attributed his improved mood/depression to having conversations with his physicians and participating in group discussions/activities. He continues to refuse risperdal or any mood stabilizing/enhancing medications besides Remeron which he has tried previously with  success. 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   Additionally we discussed marijuana cessation and chronic back pain control. He acknowledges paranoia likely worsened by frequent marijuana use and has a strong desire to quit. He is very independent and lives alone which makes quitting difficult, and wishes a medication existed specific for marijuana cessation. He is also interested in outpatient therapy (individual and group). Asked for a referral to pain management clinic for back pain evaluation/treatment.   ROS: On interview SI, HI, AH, VH are not present. Jealous type delusions and paranoia regarding medication/treatment were present as detailed above.    Past Psychiatric History:  -Recurrent MDD with severe features and possible psychosis (AH/VH/paranoia) requiring at least 6 BHH (most recent 02/18/23) -Cannabis use disorder, current -Tobacco use disorder, current -Hx of Alcohol use disorder (10 years sober) -No current psychiatrist/therapist -No hx of suicidal or homicidal attempts  Family Psychiatric  History: AUD in mother  Social History:  Social History   Substance and Sexual Activity  Alcohol Use None     Social History   Substance and Sexual Activity  Drug Use Not on file    Social History   Socioeconomic History   Marital status: Single    Spouse name: Not on file   Number of children: Not on file   Years of education: Not on file   Highest education level: Not on file  Occupational History   Not on file  Tobacco Use   Smoking status: Never   Smokeless tobacco: Never  Substance and Sexual Activity   Alcohol use: Not on file   Drug use: Not on file   Sexual activity: Not  on file  Other Topics Concern   Not on file  Social History Narrative   Not on file   Social Determinants of Health    Financial Resource Strain: High Risk (01/29/2021)   Received from [facility], [facility]   Overall Financial Resource Strain (CARDIA)    Difficulty of Paying Living Expenses: Hard  Food Insecurity: Food Insecurity Present (10/26/2022)   Received from [facility]   Hunger Vital Sign    Worried About Running Out of Food in the Last Year: Sometimes true    Ran Out of Food in the Last Year: Sometimes true  Transportation Needs: No Transportation Needs (01/29/2021)   Received from [facility], [facility]   [facility] - Transportation    In the past 12 months, has lack of transportation kept you from medical appointments or from getting medications?: No    Lack of Transportation (Non-Medical): No  Physical Activity: Sufficiently Active (01/29/2021)   Received from [facility], [facility]   Exercise Vital Sign    Days of Exercise per Week: 5 days    Minutes of Exercise per Session: 30 min  Stress: Stress Concern Present (01/29/2021)   Received from [facility], [facility]   [employer] of Occupational Health - Occupational Stress Questionnaire    Feeling of Stress : Very much  Social Connections: Socially Isolated (01/29/2021)   Received from [facility], [facility]   Social Connection and Isolation Panel [NHANES]    Frequency of Communication with Friends and Family: Never    Frequency of Social Gatherings with Friends and Family: Never    Attends Religious Services: Never    Database administrator or Organizations: Yes    Attends Engineer, structural: Never    Marital Status: Never married   Current Medications: Current Facility-Administered Medications  Medication Dose Route Frequency Provider Last Rate Last Admin   alum & mag hydroxide-simeth (MAALOX/MYLANTA) 200-200-20 MG/5ML suspension  30 mL  30 mL Oral Q6H PRN Starkes-Perry, Juel Burrow, FNP       cephALEXin (KEFLEX) capsule 250 mg  250 mg Oral Q12H Darcel Smalling, MD   250 mg at 01/06/23 0815   hydrOXYzine (ATARAX) tablet 25 mg  25 mg Oral TID PRN Maryagnes Amos, FNP   25 mg at 01/02/23 2341   Or   diphenhydrAMINE (BENADRYL) injection 50 mg  50 mg Intramuscular TID PRN Maryagnes Amos, FNP       doxycycline (VIBRA-TABS) tablet 100 mg  100 mg Oral Q12H Darcel Smalling, MD   100 mg at 01/06/23 0815   magnesium hydroxide (MILK OF MAGNESIA) suspension 15 mL  15 mL Oral QHS PRN Maryagnes Amos, FNP        Lab Results:  Results for orders placed or performed during the hospital encounter of 01/02/23 (from the past 48 hour(s))  Urinalysis, Complete w Microscopic -Urine, Clean Catch     Status: None   Collection Time: 01/05/23  3:18 PM  Result Value Ref Range   Color, Urine YELLOW YELLOW   APPearance CLEAR CLEAR   Specific Gravity, Urine 1.016 1.005 - 1.030   pH 6.0 5.0 - 8.0   Glucose, UA NEGATIVE NEGATIVE mg/dL   Hgb urine dipstick NEGATIVE NEGATIVE   Bilirubin Urine NEGATIVE NEGATIVE   Ketones, ur NEGATIVE NEGATIVE mg/dL   Protein, ur NEGATIVE NEGATIVE mg/dL  Nitrite NEGATIVE NEGATIVE   Leukocytes,Ua NEGATIVE NEGATIVE   RBC / HPF 0-5 0 - 5 RBC/hpf   WBC, UA 0-5 0 - 5 WBC/hpf   Bacteria, UA NONE SEEN NONE SEEN   Squamous Epithelial / HPF 0-5 0 - 5 /HPF   Mucus PRESENT     Comment: Performed at [facility], [address] [place], [place] [address]    Blood Alcohol level:  Lab Results  Component Value Date   [facility] <10 01/01/2023    Musculoskeletal: Strength & Muscle Tone: within normal limits Gait & Station: normal   Psychiatric Specialty Exam:  Presentation  General Appearance: Appropriate for Environment  Eye Contact:Good  Speech:Clear and Coherent  Speech Volume:Normal  Handedness:-- (not assessed)   Mood and Affect  Mood:Depressed;  Hopeless  Affect:Appropriate   Thought Process  Thought Processes:Coherent  Descriptions of Associations:Intact  Orientation:Grossly intact  Thought Content:Paranoid Ideation; Logical  History of Schizophrenia/Schizoaffective disorder:No  Duration of Psychotic Symptoms:N/A  Hallucinations:Hallucinations: None  Ideas of Reference:None  Suicidal Thoughts:Suicidal Thoughts: No  Homicidal Thoughts:Homicidal Thoughts: No   Sensorium  Memory:Immediate Good; Recent Good  Judgment:Fair  Insight:Good   Executive Functions  Concentration:Good  Attention Span:Good  Recall:Good  Fund of Knowledge:Good  Language:Good   Psychomotor Activity  Psychomotor Activity:Psychomotor Activity: Normal   Assets  Assets:Desire for Improvement; Resilience; Communication Skills; Physical Health   Sleep  Sleep:Sleep: Good    Physical Exam: Physical Exam Vitals and nursing note reviewed.  Constitutional:      General: He is not in acute distress.    Appearance: He is not ill-appearing.  HENT:     Head: Normocephalic and atraumatic.  Pulmonary:     Effort: Pulmonary effort is normal. No respiratory distress.  Skin:    General: Skin is warm and dry.  Neurological:     General: No focal deficit present.    Review of Systems  All other systems reviewed and are negative.  Blood pressure 120/85, pulse 63, temperature 98.4 F (36.9 C), temperature source Oral, resp. rate 18, height 5\' 7"  (1.702 m), weight 53 kg, SpO2 100%. Body mass index is 18.3 kg/m.   Treatment Plan Summary: Daily contact with patient to assess and evaluate symptoms and progress in treatment  Assessment and Treatment Plan Reviewed on 03/25/23   ASSESSMENT: Hooper Griesel is a 52 y.o. 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  Patient has  6 prior psychiatric hospitalizations identified on chart review. Patient initially arrived to [facility] on 03/22/23 for SI and worsening depression, and admitted to [facility] voluntarily on 03/23/23 for acute safety concerns, acute suicidal or self-harming behaviors, and homicidal behaviors.   BHH day 2. Depression is improving and no longer having HI. Refusing recommended mood stabilizing medications (SGAs, first line antidepressants) after lengthy discussions. Restarting Remeron as currently only medication he trusts.   Diagnoses / Active Problems: MDD with psychotic features vs substance induced psychosis versus primary psychotic disorder Cannabis Use Disorder Tobacco Use Disorder Alcohol use disorder,    PLAN: Safety and Monitoring:  --  VOLUNTARY  admission to inpatient psychiatric unit for safety, stabilization and treatment  -- Daily contact with patient to assess and evaluate symptoms and progress in treatment  -- Patient's case to be discussed in multi-disciplinary team meeting  -- Observation Level : q15 minute checks   -- Vital signs:  q12 hours  --  Precautions: suicide, elopement, and assault  2. Psychiatric Diagnoses and Treatment:   -Re-start Remeron 15 mg daily -Risperidone d/c'd (patient never took and refusing) -Prozac d/c'd (patient never took and refusing) -- The risks/benefits/side-effects/alternatives to this medication were discussed in detail with the patient and legal guardian, time was given for questions. All scheduled medications were discussed with and approved by the legal guardian prior to administration. Documentation of this approval is on file.  -- Encouraged patient to participate in unit milieu and in scheduled group therapies              -- Short Term Goals: Ability to identify changes in lifestyle to reduce recurrence of condition will improve and Ability to verbalize feelings will improve             -- Long Term Goals: Improvement in symptoms so as ready for  discharge Other PRNS:  Maalox Mylanta every 4 hours as needed Atarax 25 mg 3 times daily as needed Medical magnesia daily as needed Trazodone 50 mg nightly as needed Agitation protocol Haldol, Benadryl, Ativan)   Other labs reviewed on admission:  CMP unremarkable CBC showing normocytic anemia (Hgb 12.5, MCV 95.8) Ethanol <10 Salicylate and acetaminophen levels negative for toxicity UDS positive for THC               3. Medical Issues Being Addressed:    #Tobacco Use Disorder  Nicotine patch 21mg /24 hours ordered  Smoking cessation encouraged   # Normocytic anemia Ferritin level ordered-pending   4. Discharge Planning:              -- Social work and case management to assist with discharge planning and identification of hospital follow-up needs prior to discharge             -- Estimated Discharge Date: TBD             -- Discharge Concerns: Need to establish a safety plan; Medication compliance and effectiveness             -- Discharge Goals: Return home with outpatient referrals for mental health follow-up including medication management/psychotherapy   I certify that inpatient services furnished can reasonably be expected to improve the patient's condition.   This note was created using a voice recognition software as a result there may be grammatical errors inadvertently enclosed that do not reflect the nature of this encounter. Every attempt is made to correct such errors.   SignedKirby Crigler, MS3 [facility]  11/21/202411:07 AM   I personally was present and performed or re-performed the history, physical exam and medical decision-making activities of this service and have verified that the service and findings are accurately documented in the student's note.   Dr. Liston Alba, MD PGY-2, Psychiatry Residency

## 2023-03-25 NOTE — Group Note (Unsigned)
Date:  03/25/2023 Time:  9:18 AM  Group Topic/Focus:  Goals Group:   The focus of this group is to help patients establish daily goals to achieve during treatment and discuss how the patient can incorporate goal setting into their daily lives to aide in recovery. Orientation:   The focus of this group is to educate the patient on the purpose and policies of crisis stabilization and provide a format to answer questions about their admission.  The group details unit policies and expectations of patients while admitted.     Participation Level:  {BHH PARTICIPATION UYQIH:47425}  Participation Quality:  {BHH PARTICIPATION QUALITY:22265}  Affect:  {BHH AFFECT:22266}  Cognitive:  {BHH COGNITIVE:22267}  Insight: {BHH Insight2:20797}  Engagement in Group:  {BHH ENGAGEMENT IN ZDGLO:75643}  Modes of Intervention:  {BHH MODES OF INTERVENTION:22269}  Additional Comments:  ***  Maximina Pirozzi D Lillard Bailon 03/25/2023, 9:18 AM

## 2023-03-25 NOTE — BHH Group Notes (Signed)
BHH Group Notes:  (Nursing/MHT/Case Management/Adjunct)  Date:  03/25/2023  Time:  9:38 PM  Type of Therapy:   Wrap-up group  Participation Level:  Active  Participation Quality:  Appropriate  Affect:  Appropriate  Cognitive:  Appropriate  Insight:  Good  Engagement in Group:  Engaged  Modes of Intervention:  Education  Summary of Progress/Problems: Goal to finish reading a book, Rated day 8/10.  [name] 03/25/2023, 9:38 PM

## 2023-03-25 NOTE — Plan of Care (Signed)
  Problem: Education: Goal: Emotional status will improve Outcome: Progressing Goal: Mental status will improve Outcome: Progressing   Problem: Activity: Goal: Interest or engagement in activities will improve Outcome: Progressing Goal: Sleeping patterns will improve Outcome: Progressing
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
## 2023-03-25 NOTE — Progress Notes (Signed)
Pt denied SI/HI/AVH this morning. Pt rated his depression a 0/10, anxiety a 0/10, and feelings of hopelessness a 0/10. Pt reports that he slept "good" last night. Pt endorses 10/10 chronic mid-lower back pain today. Pt reports that his goal for today is "to attend all groups". 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 RN provided support and encouragement to patient. Q15 min checks verified for safety. Pt is safe on the unit.   03/25/23 0946  Psych Admission Type (Psych Patients Only)  Admission Status Voluntary  Psychosocial Assessment  Patient Complaints Suspiciousness;Irritability  Eye Contact Fair  Facial Expression Flat;Sad  Affect Depressed;Sad  Speech Logical/coherent  Interaction Assertive  Motor Activity Slow  Appearance/Hygiene Unremarkable  Behavior Characteristics Cooperative;Irritable  Mood Depressed;Irritable  Thought Process  Coherency WDL  Content WDL  Delusions WDL  Perception WDL  Hallucination None reported or observed  Judgment Impaired  Confusion None  Danger to Self  Current suicidal ideation? Denies  Self-Injurious Behavior No self-injurious ideation or behavior indicators observed or expressed   Agreement Not to Harm Self Yes  Description of Agreement Pt verbally contracts for safety  Danger to Others  Danger to Others None reported or observed

## 2023-03-26 NOTE — Progress Notes (Signed)
[facility] MD Progress Note Patient Identification: [name] MRN:  [id number] Date of Evaluation:  03/26/2023 Chief Complaint:  MDD (major depressive disorder) [F32.9] Principal Diagnosis: MDD (major depressive disorder) Diagnosis:  Principal Problem:   MDD (major depressive disorder)   Principal Problem: MDD (major depressive disorder), recurrent episode, severe (HCC) Diagnosis: Principal Problem:   MDD (major depressive disorder), recurrent episode, severe (HCC)  Total Time spent with patient: 30 minutes  BSH  [name] is a 52 y.o. 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  Patient has 6 prior psychiatric hospitalizations identified on chart review. Patient initially arrived to [facility] on 03/22/23 for SI and worsening depression, and admitted to [facility] voluntarily on 03/23/23 for acute safety concerns, acute suicidal or self-harming behaviors, and homicidal behaviors.   Subjective:  Patient evaluated on the unit. [name] endorses continued improved mood this morning, better than previous days. He attributed his improved mood to group therapy, conversation with daughter this morning, and good night of sleep. He said he had a great conversation with daughter [name]) where he updated her on his [facility] course and that he was ok. He explained that he didn't provide her with many details about "bad thoughts about partner/colleague and thoughts of self-harm." He took Remeron last night and is unsure of whether it is helping or not, but not having any side effects. He says he likely discontinue Remeron when he discharges, noting he only took it here so he could be discharged sooner as he notices "patients here stay longer if they refuse medication." He is eager to discharge for financial reasons and start working again.   Collateral: He agreed  for [place] to contact  adult daughter [name] [phone]) after 3pm today. Attempted to contact at 3:59 PM, no answer.    ROS: On interview SI, HI, AH, VH are not present. Jealous type delusions and paranoia regarding medication/treatment were present as detailed above.    Past Psychiatric History:  -Recurrent MDD with severe features and possible psychosis (AH/VH/paranoia) requiring at least 6 [facility] (most recent 02/18/23) -Cannabis use disorder, current -Tobacco use disorder, current -Hx of Alcohol use disorder (10 years sober) -No current psychiatrist/therapist -No hx of suicidal or homicidal attempts  Family Psychiatric  History: AUD in mother  Social History:  Social History   Substance and Sexual Activity  Alcohol Use None     Social History   Substance and Sexual Activity  Drug Use Not on file    Social History   Socioeconomic History   Marital status: Single    Spouse name: Not on file   Number of children: Not on file   Years of education: Not on file   Highest education level: Not on file  Occupational History   Not on file  Tobacco Use   Smoking status: Never   Smokeless tobacco: Never  Substance and Sexual Activity   Alcohol use: Not on file   Drug use: Not on file   Sexual activity: Not on file  Other Topics Concern   Not on file  Social History Narrative   Not on file   Social Determinants of Health   Financial Resource Strain: High Risk (01/29/2021)   Received from [facility], [facility]   Overall Financial Resource Strain (CARDIA)    Difficulty of Paying Living Expenses: Hard  Food Insecurity: Food Insecurity Present (10/26/2022)  Received from [facility]   Hunger Vital Sign    Worried About Running Out of Food in the Last Year: Sometimes true    Ran Out of Food in the Last Year: Sometimes true  Transportation Needs: No Transportation Needs (01/29/2021)   Received from [facility], [facility]   [facility] - Transportation    In the past 12 months, has lack of transportation kept you from medical appointments or from getting medications?: No    Lack of Transportation (Non-Medical): No  Physical Activity: Sufficiently Active (01/29/2021)   Received from [facility], [facility]   Exercise Vital Sign    Days of Exercise per Week: 5 days    Minutes of Exercise per Session: 30 min  Stress: Stress Concern Present (01/29/2021)   Received from [facility], [facility]   [employer] [facility] - Occupational Stress Questionnaire    Feeling of Stress : Very much  Social Connections: Socially Isolated (01/29/2021)   Received from [facility], [facility]   Social Connection and Isolation Panel [NHANES]    Frequency of Communication with Friends and Family: Never    Frequency of Social Gatherings with Friends and Family: Never    Attends Religious Services: Never    Database administrator or Organizations: Yes    Attends Engineer, structural: Never    Marital Status: Never married   Current Medications: Current Facility-Administered Medications  Medication Dose Route Frequency Provider Last Rate Last Admin   alum & mag hydroxide-simeth (MAALOX/MYLANTA) 200-200-20 MG/5ML suspension 30 mL  30 mL Oral Q6H PRN Starkes-Perry, Juel Burrow, FNP       cephALEXin (KEFLEX) capsule 250 mg  250 mg Oral Q12H Darcel Smalling, MD   250 mg at 01/06/23 0815   hydrOXYzine (ATARAX) tablet 25 mg  25 mg Oral TID PRN Maryagnes Amos, FNP   25 mg at 01/02/23 2341   Or   diphenhydrAMINE (BENADRYL) injection 50 mg  50 mg Intramuscular TID PRN Maryagnes Amos, FNP       doxycycline (VIBRA-TABS) tablet 100 mg  100 mg Oral Q12H Darcel Smalling, MD   100 mg at 01/06/23 0815   magnesium hydroxide (MILK OF MAGNESIA) suspension 15 mL  15 mL Oral QHS PRN  Maryagnes Amos, FNP        Lab Results:  Results for orders placed or performed during the hospital encounter of 01/02/23 (from the past 48 hour(s))  Urinalysis, Complete w Microscopic -Urine, Clean Catch     Status: None   Collection Time: 01/05/23  3:18 PM  Result Value Ref Range   Color, Urine YELLOW YELLOW   APPearance CLEAR CLEAR   Specific Gravity, Urine 1.016 1.005 - 1.030   pH 6.0 5.0 - 8.0   Glucose, UA NEGATIVE NEGATIVE mg/dL   Hgb urine dipstick NEGATIVE NEGATIVE   Bilirubin Urine NEGATIVE NEGATIVE   Ketones, ur NEGATIVE NEGATIVE mg/dL   Protein, ur NEGATIVE NEGATIVE mg/dL   Nitrite NEGATIVE NEGATIVE   Leukocytes,Ua NEGATIVE NEGATIVE   RBC / HPF 0-5 0 - 5 RBC/hpf   WBC, UA 0-5 0 - 5 WBC/hpf   Bacteria, UA NONE SEEN NONE SEEN   Squamous Epithelial / HPF 0-5 0 - 5 /HPF   Mucus PRESENT     Comment: Performed at [facility], [address] [place], [place] [address]  Blood Alcohol level:  Lab Results  Component Value Date   [facility] <10 01/01/2023    Musculoskeletal: Strength & Muscle Tone: within normal limits Gait & Station: normal   Psychiatric Specialty Exam:  Presentation  General Appearance: Appropriate for Environment  Eye Contact:Good  Speech:Clear and Coherent  Speech Volume:Normal  Handedness:-- (not assessed)   Mood and Affect  Mood:Depressed; Hopeless  Affect:Appropriate   Thought Process  Thought Processes:Coherent  Descriptions of Associations:Intact  Orientation:Grossly intact  Thought Content:Paranoid Ideation; Logical (Paranoid about medications)  History of Schizophrenia/Schizoaffective disorder:No  Duration of Psychotic Symptoms:N/A  Hallucinations:Hallucinations: None  Ideas of Reference:None  Suicidal Thoughts:Suicidal Thoughts: No  Homicidal Thoughts:Homicidal Thoughts: No   Sensorium  Memory:Immediate Good; Recent Good  Judgment:Good  Insight:Good   Executive Functions   Concentration:Good  Attention Span:Good  Recall:Good  Fund of Knowledge:Good  Language:Good   Psychomotor Activity  Psychomotor Activity:Psychomotor Activity: Normal   Assets  Assets:Communication Skills; Desire for Improvement; Physical Health; Resilience   Sleep  Sleep:Sleep: Good Number of Hours of Sleep: 6    Physical Exam: Physical Exam Vitals and nursing note reviewed.  Constitutional:      General: He is not in acute distress.    Appearance: He is not ill-appearing.  HENT:     Head: Normocephalic and atraumatic.  Pulmonary:     Effort: Pulmonary effort is normal. No respiratory distress.  Skin:    General: Skin is warm and dry.  Neurological:     General: No focal deficit present.    Review of Systems  All other systems reviewed and are negative.  Blood pressure 106/69, pulse 61, temperature 98.2 F (36.8 C), temperature source Oral, resp. rate 16, height 5\' 7"  (1.702 m), weight 53 kg, SpO2 100%. Body mass index is 18.3 kg/m.   Treatment Plan Summary: Daily contact with patient to assess and evaluate symptoms and progress in treatment  Assessment and Treatment Plan Reviewed on 03/26/23   ASSESSMENT: Daryk Bohanan is a 52 y.o. 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  Patient has 6 prior psychiatric hospitalizations identified on chart review. Patient initially arrived to [facility] on 03/22/23 for SI and worsening depression, and admitted to [facility] voluntarily on 03/23/23 for acute safety concerns, acute suicidal or self-harming behaviors, and homicidal behaviors.   B[facility] day 3. Denies any depression, feels great today. Restarted Remeron last night, denies any side effects but endorses he will discontinue after discharge.  Diagnoses / Active Problems: MDD with psychotic features vs substance induced  psychosis versus primary psychotic disorder Cannabis Use Disorder Tobacco Use Disorder Alcohol use disorder,    PLAN: Safety and Monitoring:  --  VOLUNTARY  admission to inpatient psychiatric unit for safety, stabilization and treatment  -- Daily contact with patient to assess and evaluate symptoms and progress in treatment  -- Patient's case to be discussed in multi-disciplinary team meeting  -- Observation Level : q15 minute checks   -- Vital signs:  q12 hours  -- Precautions: suicide, elopement, and assault  2. Psychiatric Diagnoses and Treatment:   -Continue Remeron 15 mg daily -- The risks/benefits/side-effects/alternatives to this medication were discussed in detail with the patient and legal guardian, time was given for questions. All scheduled medications were discussed with and approved by the legal guardian prior to administration. Documentation of this approval is on file.  -- Encouraged patient to participate in unit milieu  and in scheduled group therapies              -- Short Term Goals: Ability to identify changes in lifestyle to reduce recurrence of condition will improve and Ability to verbalize feelings will improve             -- Long Term Goals: Improvement in symptoms so as ready for discharge Other PRNS:  Maalox Mylanta every 4 hours as needed Atarax 25 mg 3 times daily as needed Medical magnesia daily as needed Trazodone 50 mg nightly as needed Agitation protocol Haldol, Benadryl, Ativan)   Other labs reviewed on admission:  CMP unremarkable CBC showing normocytic anemia (Hgb 12.5, MCV 95.8) Ethanol <10 Salicylate and acetaminophen levels negative for toxicity UDS positive for THC               3. Medical Issues Being Addressed:    #Tobacco Use Disorder  Nicotine patch 21mg /24 hours ordered  Smoking cessation encouraged   # Normocytic anemia Ferritin (156) WNL   4. Discharge Planning:              -- Social work and case management to assist with  discharge planning and identification of hospital follow-up needs prior to discharge             -- Estimated Discharge Date: Sunday, 03/28/23             -- Discharge Concerns: Need to establish a safety plan; Medication compliance and effectiveness             -- Discharge Goals: Return home with outpatient referrals for mental health follow-up including medication management/psychotherapy   I certify that inpatient services furnished can reasonably be expected to improve the patient's condition.   This note was created using a voice recognition software as a result there may be grammatical errors inadvertently enclosed that do not reflect the nature of this encounter. Every attempt is made to correct such errors.   Signed: Kirby Crigler MS3 [facility]  11/22/202412:35 PM   I personally was present and performed or re-performed the history, physical exam and medical decision-making activities of this service and have verified that the service and findings are accurately documented in the student's note.   Dr. Liston Alba, MD PGY-2, Psychiatry Residency

## 2023-03-26 NOTE — Progress Notes (Signed)
   03/26/23 2015  Psych Admission Type (Psych Patients Only)  Admission Status Voluntary  Psychosocial Assessment  Patient Complaints Suspiciousness  Eye Contact Fair  Facial Expression Anxious  Affect Preoccupied  Speech Logical/coherent  Interaction Assertive  Motor Activity Slow  Appearance/Hygiene Unremarkable  Behavior Characteristics Cooperative  Mood Depressed;Preoccupied  Aggressive Behavior  Effect No apparent injury  Thought Process  Coherency WDL  Content WDL  Delusions WDL  Perception WDL  Hallucination None reported or observed  Judgment WDL  Confusion WDL  Danger to Self  Current suicidal ideation? Passive  Self-Injurious Behavior Some self-injurious ideation observed or expressed.  No lethal plan expressed   Agreement Not to Harm Self Yes  Description of Agreement verbal contract for safety  Danger to Others  Danger to Others None reported or observed

## 2023-03-26 NOTE — Progress Notes (Signed)
   03/26/23 0800  Psych Admission Type (Psych Patients Only)  Admission Status Voluntary  Psychosocial Assessment  Patient Complaints Suspiciousness  Eye Contact Fair  Facial Expression Anxious  Affect Preoccupied  Speech Logical/coherent  Interaction Assertive  Motor Activity Slow  Appearance/Hygiene Unremarkable  Behavior Characteristics Cooperative  Mood Depressed  Thought Process  Coherency WDL  Content WDL  Delusions None reported or observed  Perception WDL  Hallucination None reported or observed  Judgment WDL  Confusion WDL  Danger to Self  Current suicidal ideation? Denies  Self-Injurious Behavior No self-injurious ideation or behavior indicators observed or expressed   Agreement Not to Harm Self Yes  Description of Agreement verbal  Danger to Others  Danger to Others None reported or observed

## 2023-03-26 NOTE — Plan of Care (Signed)
Problem: Education: Goal: Knowledge of [facility] Education information/materials will improve Outcome: Progressing Goal: Emotional status will improve Outcome: Progressing   Problem: Activity: Goal: Interest or engagement in activities will improve Outcome: Progressing Goal: Sleeping patterns will improve Outcome: Progressing   Problem: Safety: Goal: Periods of time without injury will increase Outcome: Progressing

## 2023-03-26 NOTE — B[facility] Group Notes (Signed)
[facility] Group Notes:  (Nursing/MHT/Case Management/Adjunct)  Date:  03/26/2023  Time:  8:32 PM  Type of Therapy:   AA group  Participation Level:  Active  Participation Quality:  Appropriate  Affect:  Appropriate  Cognitive:  Appropriate  Insight:  Appropriate  Engagement in Group:  Engaged  Modes of Intervention:  Education  Summary of Progress/Problems:Attended AA meeting.  [name] 03/26/2023, 8:32 PM

## 2023-03-27 MED ORDER — MIRTAZAPINE 15 MG PO TBDP: 15.0000 mg | ORAL_TABLET | Freq: Every day | ORAL | 0 refills | Status: DC

## 2023-03-27 MED ORDER — HYDROXYZINE HCL 25 MG PO TABS: 25.0000 mg | ORAL_TABLET | Freq: Three times a day (TID) | ORAL | 0 refills | Status: DC | PRN

## 2023-03-27 MED ORDER — NICOTINE 21 MG/24HR TD PT24: 21.0000 mg | MEDICATED_PATCH | Freq: Every day | TRANSDERMAL | 0 refills | Status: DC

## 2023-03-27 NOTE — Discharge Summary (Addendum)
Physician Discharge Summary Note Patient:  [name] is an 52 y.o., male MRN:  [id number] DOB:  August 04, 1970 Patient phone:  [phone] (home)  Patient address:   [address] [place] [address],  Total Time spent with patient: 30 minutes  Date of Admission:  03/23/2023 Date of Discharge: 03/27/2023  Reason for Admission:  suicidal or self-harming behaviors, and homicidal behaviors   [name] is a 52 y.o. 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  Patient has 6 prior psychiatric hospitalizations identified on chart review. Patient initially arrived to [facility] on 03/22/23 for SI and worsening depression, and admitted to [facility] voluntarily on 03/23/23 for acute safety concerns, acute suicidal or self-harming behaviors, and homicidal behaviors.     Principal Problem: MDD (major depressive disorder) Discharge Diagnoses: Principal Problem:   MDD (major depressive disorder)   Past Psychiatric Hx: -Recurrent MDD with severe features and possible psychosis (AH/VH/paranoia) requiring at least 6 BHH (most recent 02/18/23) -Cannabis use disorder, current -Tobacco use disorder, current -Hx of Alcohol use disorder (10 years sober) -No current psychiatrist/therapist -No hx of suicidal or homicidal attempts   Substance Abuse Hx: Alcohol: sober for ~10 years Tobacco: 0.5 ppd Cannabis: marijuana daily (positive on UDS) Other Illicit drugs: denies other illicit drug use Rx drug abuse: None reported  Rehab hx: [facility] for AUD, has been to numerous others most recently and successfully at [facility]    Past Medical History: PCP: unknown  Medical Dx: Tobacco use disorder, substance use (marijuana)  Medications: none reported Allergies: Ibuprofen, Acetaminophen, Asprin, Pork-derived Products, sertraline. Unsure of reaction  to each medication but endorses N/V, Hives and possible anaphylaxis? Surgeries: None Trauma: Head injury  Seizures: none identified on chart review     Family Medical History: Father passed away from cirrhosis.   Family Psychiatric History: Medical: None reported. Psych: None reported. Psych Rx: None reported. SA/HA: None reported. Substance use family hx: Not asked.     Social History: Childhood: unknown Abuse: None reported. 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  Psychiatric diagnoses provided upon initial assessment: Principal Problem:   MDD (major depressive disorder)   The following medications were managed:  Upon admission, the following medications were changed / started / discontinued: Start Prozac 10 mg daily for symptoms of depression Start Risperdal 1 mg nightly for paranoia and agitation  During the patient's stay, the following medications were changed / started / discontinued, with final adjustments by discharge: -Re-start Remeron 15 mg daily -Risperidone d/c'd (patient never took and refusing) -Prozac d/c'd (patient never took and refusing)  During the hospitalization, patient had the following lab / imaging / testing abnormalities which require further evaluation / management / treatment: CBC showing normocytic anemia (Hgb 12.5, MCV 95.8)  Patient's care was discussed during the interdisciplinary team meeting every day during the  hospitalization.  The patient denies any side effects to prescribed psychiatric medication.  Gradually, patient started adjusting to milieu. The  patient was evaluated each day by a clinical provider to ascertain response to treatment. Improvement was noted by the patient's report of decreasing symptoms, improved sleep and appetite, affect, medication tolerance, behavior, and participation in unit programming.  Patient was asked each day to complete a self inventory noting mood, mental status, pain, new symptoms, anxiety and concerns.    Symptoms were reported as significantly decreased or resolved completely by discharge.   On day of discharge, the patient reports that their mood is stable. The patient denied having suicidal thoughts for more than 48 hours prior to discharge.  Patient denies having homicidal thoughts.  Patient denies having auditory hallucinations.  Patient denies any visual hallucinations or other symptoms of psychosis. The patient was motivated to continue taking medication with a goal of continued improvement in mental health.   The patient reports their target psychiatric symptoms of depression responded well to the psychiatric medications, and the patient reports overall benefit other psychiatric hospitalization. Supportive psychotherapy was provided to the patient. The patient also participated in regular group therapy while hospitalized. Coping skills, problem solving as well as relaxation therapies were also part of the unit programming.  Labs were reviewed with the patient, and abnormal results were discussed with the patient.  The patient is able to verbalize their individual safety plan to this provider.  Behavioral Events: none  Restraints: none  # It is recommended to the patient to continue psychiatric medications as prescribed, after discharge from the hospital.    # It is recommended to the patient to follow up with your outpatient psychiatric provider and  PCP.  # It was discussed with the patient, the impact of alcohol, drugs, tobacco have been there overall psychiatric and medical wellbeing, and total abstinence from substance use was recommended to the patient.  # Prescriptions provided or sent directly to preferred pharmacy at discharge. Patient agreeable to plan. Given opportunity to ask questions. Appears to feel comfortable with discharge.    # In the event of worsening symptoms, the patient is instructed to call the crisis hotline, 911 and or go to the nearest ED for appropriate evaluation and treatment of symptoms. To follow-up with primary care provider for other medical issues, concerns and or health care needs  # Patient was discharged home with a plan to follow up as noted below.  Physical Findings: AIMS:  , ,  ,  ,    CIWA:    COWS:     Mental Status Exam: General Appearance: appears at stated age, casually dressed and groomed   Behavior: pleasant and cooperative   Psychomotor Activity: no psychomotor agitation or retardation noted   Eye Contact: fair  Speech: normal amount, tone, volume and fluency    Mood: euthymic  Affect: congruent, pleasant and interactive   Thought Process: linear, goal directed, no circumstantial or tangential thought process noted, no racing thoughts or flight of ideas  Descriptions of Associations: intact  Thought Content: no bizarre content, logical and future-oriented  Hallucinations: denies AH, VH , does not appear responding to stimuli  Delusions: no paranoia, delusions of control, grandeur, ideas of reference, thought broadcasting, and magical thinking  Suicidal Thoughts: denies SI, intention, plan  Homicidal Thoughts: denies HI, intention, plan   Alertness/Orientation: alert and fully oriented   Insight: fair, improved  Judgment: fair, improved   Memory: intact   Executive Functions  Concentration: intact  Attention Span: fair  Recall: intact  Fund of Knowledge: fair     Physical Exam  General:  Pleasant, well-appearing. No acute distress. Pulmonary: Normal effort. No wheezing or rales. Skin: No obvious rash or lesions. Neuro: A&Ox3.No focal deficit.   Review of Systems  No reported symptoms  Blood pressure (!) 116/92, pulse 86, temperature 98.2 F (36.8 C), temperature source Oral, resp. rate 20, height 5\' 7"  (1.702 m), weight 53 kg, SpO2 100%. Body mass index is 18.3 kg/m.  Assets  Assets:Communication Skills; Desire for Improvement; Physical Health; Resilience   Social History   Tobacco Use  Smoking Status Every Day   Current packs/day: 0.25   Types: Cigarettes  Smokeless Tobacco Never   Tobacco Cessation:  A prescription for an FDA-approved tobacco cessation medication provided at discharge  Blood Alcohol level:  Lab Results  Component Value Date   [facility] <10 03/22/2023   ETH <10 02/17/2023    Metabolic Disorder Labs:  Lab Results  Component Value Date   HGBA1C 5.2 08/27/2014   MPG 103 08/27/2014   MPG 105 03/08/2013   No results found for: "PROLACTIN" No results found for: "CHOL", "TRIG", "HDL", "CHOLHDL", "VLDL", "LDLCALC"   Is patient on multiple antipsychotic therapies at discharge:  No   Has Patient had three or more failed trials of antipsychotic monotherapy by history:  No  Recommended Plan for Multiple Antipsychotic Therapies: NA  Discharge Instructions     Ambulatory referral to Pain Clinic   Complete by: As directed       Allergies as of 03/27/2023       Reactions   Acetaminophen Nausea Only   Aspirin Hives, Nausea And Vomiting   Ibuprofen Hives, Nausea And Vomiting   Pork-derived Products Other (See Comments)   Stomach pain. Pt does not eat pork   Sertraline Diarrhea, Other (See Comments)   Acute GI distress with diarrhea and pain.         Medication List     STOP taking these medications    feeding supplement Liqd   gabapentin 300 MG capsule Commonly known as: NEURONTIN   lidocaine 5  % Commonly known as: LIDODERM   multivitamin Tabs tablet       TAKE these medications      Indication  hydrOXYzine 25 MG tablet Commonly known as: ATARAX Take 1 tablet (25 mg total) by mouth 3 (three) times daily as needed for anxiety.  Indication: Feeling Anxious   mirtazapine 15 MG disintegrating tablet Commonly known as: REMERON SOL-TAB Take 1 tablet (15 mg total) by mouth at bedtime.  Indication: Major Depressive Disorder   nicotine 21 mg/24hr patch Commonly known as: NICODERM CQ - dosed in mg/24 hours Place 1 patch (21 mg total) onto the skin daily. Start taking on: March 28, 2023 What changed: when to take this  Indication: Nicotine Addiction        Follow-up Information     [facility] Follow up on 04/13/2023.   Specialty: Behavioral Health Why: You have an appointment on 04/13/23 at 2:00 pm for medication management services.  You have an appointment on 05/31/23 at 11:00 am for therapy services. Contact information: [address] [place] [address] [phone]                Discharge recommendations:   Activity: as tolerated  Diet: heart healthy  # It is recommended to the patient to continue psychiatric medications as prescribed, after discharge from the hospital.     # It is recommended to the patient to follow up with your outpatient psychiatric provider -instructions on appointment  date, time, and address (location) are provided to you in discharge paperwork  # Follow-up with outpatient primary care doctor and other specialists -for management of chronic medical disease, including: chronic pain  # Testing: Follow-up with outpatient provider for abnormal lab results: normocytic anemia   # It was discussed with the patient, the impact of alcohol, drugs, tobacco have been there overall psychiatric and medical wellbeing, and total abstinence from substance use was recommended to the patient.   #  Prescriptions provided or sent directly to preferred pharmacy at discharge. Patient agreeable to plan. Given opportunity to ask questions. Appears to feel comfortable with discharge.    # In the event of worsening symptoms, the patient is instructed to call the crisis hotline, 911, and or go to the nearest ED for appropriate evaluation and treatment of symptoms. To follow-up with primary care provider for other medical issues, concerns and or health care needs  Patient agrees with D/C instructions and plan.   I discussed my assessment, planned testing and intervention for the patient with Dr. Enedina Finner who agrees with my formulated course of action.   Signed: Lance Muss, MD, PGY-2 03/27/2023, 9:04 AM

## 2023-03-27 NOTE — [facility] Suicide Risk Assessment (Signed)
Suicide Risk Assessment  Discharge Assessment    [facility] Discharge Suicide Risk Assessment   Principal Problem: MDD (major depressive disorder) Discharge Diagnoses: Principal Problem:   MDD (major depressive disorder)   Total Time spent with patient: 30 minutes  [name] is a 52 y.o. 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  Patient has 6 prior psychiatric hospitalizations identified on chart review. Patient initially arrived to [facility] on 03/22/23 for SI and worsening depression, and admitted to [facility] voluntarily on 03/23/23 for acute safety concerns, acute suicidal or self-harming behaviors, and homicidal behaviors.   Hospital Course  During the patient's hospitalization, patient had extensive initial psychiatric evaluation, and follow-up psychiatric evaluations every day.   Psychiatric diagnoses provided upon initial assessment: Principal Problem:   MDD (major depressive disorder)    The following medications were managed:   Upon admission, the following medications were changed / started / discontinued: Start Prozac 10 mg daily for symptoms of depression Start Risperdal 1 mg nightly for paranoia and agitation   During the patient's stay, the following medications were changed / started / discontinued, with final adjustments by discharge: -Re-start Remeron 15 mg daily -Risperidone d/c'd (patient never took and refusing) -Prozac d/c'd (patient never took and refusing)   During the hospitalization, patient had the following lab / imaging / testing abnormalities which require further evaluation / management / treatment: CBC showing normocytic anemia (Hgb 12.5, MCV 95.8)   Patient's care was discussed during the interdisciplinary team meeting every day during the hospitalization.   The patient denies any side effects to  prescribed psychiatric medication.   Gradually, patient started adjusting to milieu. The patient was evaluated each day by a clinical provider to ascertain response to treatment. Improvement was noted by the patient's report of decreasing symptoms, improved sleep and appetite, affect, medication tolerance, behavior, and participation in unit programming.  Patient was asked each day to complete a self inventory noting mood, mental status, pain, new symptoms, anxiety and concerns.     Symptoms were reported as significantly decreased or resolved completely by discharge.    On day of discharge, the patient reports that their mood is stable. The patient denied having suicidal thoughts for more than 48 hours prior to discharge.  Patient denies having homicidal thoughts.  Patient denies having auditory hallucinations.  Patient denies any visual hallucinations or other symptoms of psychosis. The patient was motivated to continue taking medication with a goal of continued improvement in mental health.    The patient reports their target psychiatric symptoms of depression responded well to the psychiatric medications, and the patient reports overall benefit other psychiatric hospitalization. Supportive psychotherapy was provided to the patient. The patient also participated in regular group therapy while hospitalized. Coping skills, problem solving as well as relaxation therapies were also part of the unit programming.   Labs were reviewed with the patient, and abnormal results were discussed with the patient.   The patient is able to verbalize their individual safety plan to this provider.   Behavioral Events: none   Restraints: none   # It is recommended to the patient to continue psychiatric medications as prescribed, after discharge from the hospital.     # It is recommended to the patient to follow up with your outpatient psychiatric provider and PCP.   # It was discussed with the patient, the  impact of alcohol, drugs,  tobacco have been there overall psychiatric and medical wellbeing, and total abstinence from substance use was recommended to the patient.   # Prescriptions provided or sent directly to preferred pharmacy at discharge. Patient agreeable to plan. Given opportunity to ask questions. Appears to feel comfortable with discharge.    # In the event of worsening symptoms, the patient is instructed to call the crisis hotline, 911 and or go to the nearest ED for appropriate evaluation and treatment of symptoms. To follow-up with primary care provider for other medical issues, concerns and or health care needs   # Patient was discharged home with a plan to follow up as noted below.  Musculoskeletal: Strength & Muscle Tone: within normal limits Gait & Station: normal Patient leans: N/A  Psychiatric Specialty Exam  General Appearance: appears at stated age, casually dressed and groomed    Behavior: pleasant and cooperative    Psychomotor Activity: no psychomotor agitation or retardation noted    Eye Contact: fair  Speech: normal amount, tone, volume and fluency      Mood: euthymic  Affect: congruent, pleasant and interactive    Thought Process: linear, goal directed, no circumstantial or tangential thought process noted, no racing thoughts or flight of ideas  Descriptions of Associations: intact  Thought Content: no bizarre content, logical and future-oriented  Hallucinations: denies AH, VH , does not appear responding to stimuli  Delusions: no paranoia, delusions of control, grandeur, ideas of reference, thought broadcasting, and magical thinking  Suicidal Thoughts: denies SI, intention, plan  Homicidal Thoughts: denies HI, intention, plan    Alertness/Orientation: alert and fully oriented    Insight: fair, improved  Judgment: fair, improved    Memory: intact    Executive Functions  Concentration: intact  Attention Span: fair  Recall: intact  Fund of  Knowledge: fair      Physical Exam  General: Pleasant, well-appearing. No acute distress. Pulmonary: Normal effort. No wheezing or rales. Skin: No obvious rash or lesions. Neuro: A&Ox3.No focal deficit.     Review of Systems  No reported symptoms Blood pressure (!) 116/92, pulse 86, temperature 98.2 F (36.8 C), temperature source Oral, resp. rate 20, height 5\' 7"  (1.702 m), weight 53 kg, SpO2 100%. Body mass index is 18.3 kg/m.  Mental Status Per Nursing Assessment::   On Admission:  Suicidal ideation indicated by patient  Demographic Factors:  Male and Low socioeconomic status Loss Factors: Financial problems/change in socioeconomic status Historical Factors: Impulsivity Risk Reduction Factors:   Employed, Positive social support, and Positive coping skills or problem solving skills   Continued Clinical Symptoms:  Depression:   Recent sense of peace/wellbeing Alcohol/Substance Abuse/Dependencies  Cognitive Features That Contribute To Risk:  Closed-mindedness    Suicide Risk:  Mild: There are no identifiable suicide plans, no associated intent, mild dysphoria and related symptoms, good self-control (both objective and subjective assessment), few other risk factors, and identifiable protective factors, including available and accessible social support.    Follow-up Information     [facility] Follow up on 04/13/2023.   Specialty: Behavioral Health Why: You have an appointment on 04/13/23 at 2:00 pm for medication management services.  You have an appointment on 05/31/23 at 11:00 am for therapy services. Contact information: [address] [place] [address] [phone]                Plan Of Care/Follow-up recommendations:  Activity: as tolerated   Diet: heart healthy   # It is recommended to  the patient to continue psychiatric medications as prescribed, after discharge from the hospital.     # It is recommended  to the patient to follow up with your outpatient psychiatric provider -instructions on appointment date, time, and address (location) are provided to you in discharge paperwork   # Follow-up with outpatient primary care doctor and other specialists -for management of chronic medical disease, including: chronic pain   # Testing: Follow-up with outpatient provider for abnormal lab results: normocytic anemia   # It was discussed with the patient, the impact of alcohol, drugs, tobacco have been there overall psychiatric and medical wellbeing, and total abstinence from substance use was recommended to the patient.   # Prescriptions provided or sent directly to preferred pharmacy at discharge. Patient agreeable to plan. Given opportunity to ask questions. Appears to feel comfortable with discharge.    # In the event of worsening symptoms, the patient is instructed to call the crisis hotline, 911, and or go to the nearest ED for appropriate evaluation and treatment of symptoms. To follow-up with primary care provider for other medical issues, concerns and or health care needs   Patient agrees with D/C instructions and plan   Lance Muss, MD 03/27/2023, 9:05 AM

## 2023-03-27 NOTE — Progress Notes (Signed)
D: Pt A & O X 3. Denies SI, HI, AVH and pain at this time. D/C home as ordered. Taxi voucher given for transportation home. A: D/C instructions reviewed with pt including prescriptions and follow up appointments; compliance encouraged. All belongings from assigned locker returned to pt at time of departure. Scheduled medications given with verbal education and effects monitored. Safety checks maintained without incident till time of d/c.  R: Pt receptive to care. Compliant with medications when offered. Denies adverse drug reactions when assessed. Verbalized understanding related to d/c instructions. Signed belonging sheet in agreement with items received from locker. Ambulatory with a steady gait. Appears to be in no physical distress at time of departure.

## 2023-03-27 NOTE — BHH Group Notes (Signed)
Pt did not attend goals group. 

## 2023-03-27 NOTE — BHH Suicide Risk Assessment (Signed)
[facility] INPATIENT:  Family/Significant Other Suicide Prevention Education  Suicide Prevention Education:  Patient Refusal for Family/Significant Other Suicide Prevention Education: The patient [name] has refused to provide written consent for family/significant other to be provided Family/Significant Other Suicide Prevention Education during admission and/or prior to discharge.  Physician notified.  .Suicide Prevention Education was reviewed thoroughly with patient, including risk factors, warning signs, and what to do.  Mobile Crisis services were described and that telephone number pointed out, with encouragement to patient to put this number in personal cell phone.  Brochure was provided to patient to share with natural supports.  Patient acknowledged the ways in which they are at risk, and how working through each of their issues can gradually start to reduce their risk factors.  Patient was encouraged to think of the information in the context of people in their own lives.  Patient denied having access to firearms  Patient verbalized understanding of information provided.  Patient endorsed a desire to live.     Steffanie Dunn LCSWA  03/27/2023, 9:58 AM

## 2023-03-27 NOTE — Progress Notes (Signed)
  [facility] Adult Case Management Discharge Plan :  Will you be returning to the same living situation after discharge:  Yes,    At discharge, do you have transportation home?: No. CSW will coordinate transportation Do you have the ability to pay for your medications: Yes,  [employer]  Release of information consent forms completed and in the chart;  Patient's signature needed at discharge.  Patient to Follow up at:  Follow-up Information     [facility] Follow up on 04/13/2023.   Specialty: Behavioral Health Why: You have an appointment on 04/13/23 at 2:00 pm for medication management services.  You have an appointment on 05/31/23 at 11:00 am for therapy services. Contact information: [address] [place] [address] [phone]        [facility] Follow up.   Specialty: Pain Medicine Contact information: [address] [place] [address] [phone]                Next level of care provider has access to [facility] Link:no  Safety Planning and Suicide Prevention discussed: Yes,     Has patient been referred to the Quitline?: Patient refused referral for treatment  Patient has been referred for addiction treatment: No known substance use disorder.  Steffanie Dunn, LCSWA 03/27/2023, 9:59 AM

## 2023-04-09 ENCOUNTER — Ambulatory Visit: Payer: Commercial Managed Care - HMO | Admitting: Podiatry

## 2023-04-13 ENCOUNTER — Ambulatory Visit (HOSPITAL_COMMUNITY): Payer: Commercial Managed Care - HMO | Admitting: Physician Assistant

## 2023-04-13 VITALS — BP 118/80 | HR 54 | Ht 66.0 in | Wt 136.8 lb

## 2023-04-13 DIAGNOSIS — G8929 Other chronic pain: Secondary | ICD-10-CM

## 2023-04-13 DIAGNOSIS — F411 Generalized anxiety disorder: Secondary | ICD-10-CM | POA: Diagnosis not present

## 2023-04-13 DIAGNOSIS — M546 Pain in thoracic spine: Secondary | ICD-10-CM | POA: Diagnosis not present

## 2023-04-13 DIAGNOSIS — F332 Major depressive disorder, recurrent severe without psychotic features: Secondary | ICD-10-CM

## 2023-04-13 MED ORDER — MIRTAZAPINE 15 MG PO TABS: 15.0000 mg | ORAL_TABLET | Freq: Every day | ORAL | 1 refills | Status: DC

## 2023-04-13 NOTE — Progress Notes (Signed)
Psychiatric Initial Adult Assessment   Patient Identification: [name] MRN:  [id number] Date of Evaluation:  04/14/2023 Referral Source: Follow up appointment following discharge from [facility] Chief Complaint:   Chief Complaint  Patient presents with   Follow-up   Medication Refill   Visit Diagnosis:    ICD-10-CM   1. MDD (major depressive disorder), recurrent severe, without psychosis (HCC)  F33.2 mirtazapine (REMERON) 15 MG tablet    2. Chronic bilateral thoracic back pain  M54.6 Ambulatory referral to Pain Clinic   G89.29      History of Present Illness:    [name] is a 52 years old male with a past psychiatric history significant for depression who presents to [facility] to reestablish psychiatric care and for medication management.  Patient to the encounter stating that he was recently admitted to the hospital due to depression.  Prior to his admission, patient reports that he had a bunch of negative thoughts related to harming himself, but no plan.  Patient reports that his life has been plagued with loss stating that he lost his fiance and lost his eldest daughter to COVID.  He reports that his depression is most present when by himself for long periods of time.  Patient endorses a number of stressors attributed to his depression.  Patient reports that his food stamps recently got taken away.  He also reports that he is in constant pain and often unable to get up from the couch.  He reports that he has upcoming court dates for child support back pay.  Out of all the stressors, financial instability appears to be his greatest stressor.  He reports that he is unable to do a lot of work due to his chronic back pain.  He reports that he is currently volunteering at [employer] in [employer] on his spare time.  Patient informed provider that in order to receive food stamps, he has to volunteer a  specific number of hours.  Patient endorses depression stating that he is unsure of what he is feeling at this time; he just knows that it is not good.  Patient endorses anxiety stating that he is nervous and worried about his upcoming court date.  By his depression and anxiety, patient reports that he likes to keep to himself.  Patient has a past history of hospitalization due to mental health.  Patient was most recently discharged from [facility] on 03/27/2023.  Per chart review, patient was admitted to [facility] from [facility] ED on 03/23/2023 due to suicidal ideations in the context of worsening depression.  Patient was discharged with a diagnosis of major depressive disorder and placed on the following psychiatric medications:  Mirtazapine 15 mg at bedtime Hydroxyzine 25 mg 3 times daily as needed  Patient reports that he is currently taking only mirtazapine for the management of his depressive symptoms.  Patient denies a past history of suicide attempts.  A PHQ-9 screen was performed the patient scoring an 11.  A GAD-7 screen was also performed the patient scoring a 16.  Patient is alert and oriented x 4, calm, cooperative, and fully engaged in conversation during the encounter.  Patient exhibits depressed mood with congruent affect.  Patient reports that he is currently starving.  Patient denies suicidal or homicidal ideations.  He further denies auditory or visual hallucinations and does not appear to be responding to internal/external stimuli.  Patient denies paranoid or delusional thoughts.  Patient endorses  fair sleep and receives on average 5 to 6 hours of sleep per night.  Patient endorses increased appetite due to limited food options.  Patient denies alcohol consumption stating that it has been 10 years since he last drank.  Patient endorses tobacco use and smokes on average 5 to 6 cigarettes/day.  Patient endorses illicit drug use in the form of marijuana.  Associated  Signs/Symptoms: Depression Symptoms:  depressed mood, psychomotor agitation, psychomotor retardation, feelings of worthlessness/guilt, hopelessness, impaired memory, recurrent thoughts of death, anxiety, weight loss, (Hypo) Manic Symptoms:  Financial Extravagance, Irritable Mood, Labiality of Mood, Anxiety Symptoms:  Excessive Worry, Obsessive Compulsive Symptoms:   Patient states that he needs have to things in an organized manner, Specific Phobias, Psychotic Symptoms:  Paranoia, PTSD Symptoms: Had a traumatic exposure:  Patient states that he lost his mother at an early age. Patient reports that he recently lost his fiancee. Patient reports that he has been cheated on. Had a traumatic exposure in the last month:  N/A Re-experiencing:  Flashbacks Intrusive Thoughts Hypervigilance:  Yes Hyperarousal:  Difficulty Concentrating Irritability/Anger Sleep Avoidance:  Decreased Interest/Participation  Past Psychiatric History:  Patient has a past psychiatric history significant for depression.  During his most recent hospitalization (date of admission: 03/23/2023), patient was diagnosed with major depressive disorder  Patient has a past history of hospitalization due to mental health.  Patient was recently discharged from [facility] on 03/27/2023.  - Patient has been hospitalized at [facility] on at least 6 different occasions  Patient denies a past history of suicide attempt  Patient denies a past history of homicide attempt  Previous Psychotropic Medications: Yes , patient was restarted on mirtazapine during his most recent hospitalization  Substance Abuse History in the last 12 months:  Yes.    Consequences of Substance Abuse: Patient has a past history of marijuana use  Medical Consequences:  Patient endorses a past history of hospitalization due to substance use Legal Consequences:  Patient reports that he was arrested for drinking in the past Family  Consequences:  Patient reports that his family was disappointed in him using marijuana.  Patient reports that his daughter hates him. Blackouts:  Patient endorses a past history of blacking out DT's: Patient denies Withdrawal Symptoms:   Tremors  Past Medical History:  Past Medical History:  Diagnosis Date   Alcohol abuse    Anemia    Colitis    Severe major depression with psychotic features (HCC)    No past surgical history on file.  Family Psychiatric History:  Patient denies a family history of mental illness  Family history of suicide attempt: Patient denies Family history of homicide attempt: Patient denies Family history of substance abuse: Patient reports that his mother was an alcoholic and passed away due to cirrhosis of the liver.  Patient reports that his father also has legal problems as well.  He reports that there have been a lot of drinkers in his family.  Family History:  Family History  Problem Relation Age of Onset   Hypertension Mother    Hypertension Father    Hypertension Other     Social History:   Social History   Socioeconomic History   Marital status: Single    Spouse name: Not on file   Number of children: Not on file   Years of education: Not on file   Highest education level: Not on file  Occupational History   Not on file  Tobacco Use   Smoking status:  Every Day    Current packs/day: 0.25    Types: Cigarettes   Smokeless tobacco: Never  Vaping Use   Vaping status: Never Used  Substance and Sexual Activity   Alcohol use: No    Comment: former  last use 5+ year ago   Drug use: Yes    Frequency: 5.0 times per week    Types: Marijuana    Comment: 10 years 11/03   Sexual activity: Yes    Birth control/protection: Condom  Other Topics Concern   Not on file  Social History Narrative   Not on file   Social Determinants of Health   Financial Resource Strain: High Risk (03/19/2019)   Overall Financial Resource Strain (CARDIA)     Difficulty of Paying Living Expenses: Very hard  Food Insecurity: Food Insecurity Present (03/23/2023)   Hunger Vital Sign    Worried About Running Out of Food in the Last Year: Often true    Ran Out of Food in the Last Year: Often true  Transportation Needs: Unmet Transportation Needs (03/23/2023)   PRAPARE - Administrator, Civil Service (Medical): Yes    Lack of Transportation (Non-Medical): Yes  Physical Activity: Not on file  Stress: Not on file  Social Connections: Not on file    Additional Social History:  Patient endorses social support through God and his daughter.  Patient endorses having children.  Patient endorses housing.  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  Patient denies a past history of military experience.  Patient endorses a past history of prison and jail time.  Highest degree earned by the patient is his GED.  Patient denies access to weapons.  Allergies:   Allergies  Allergen Reactions   Acetaminophen Nausea Only   Aspirin Hives and Nausea And Vomiting   Ibuprofen Hives and Nausea And Vomiting   Pork-Derived Products Other (See Comments)    Stomach pain. Pt does not eat pork   Sertraline Diarrhea and Other (See Comments)    Acute GI distress with diarrhea and pain.     Metabolic Disorder Labs: Lab Results  Component Value Date   HGBA1C 5.2 08/27/2014   MPG 103 08/27/2014   MPG 105 03/08/2013   No results found for: "PROLACTIN" No results found for: "CHOL", "TRIG", "HDL", "CHOLHDL", "VLDL", "LDLCALC" Lab Results  Component Value Date   TSH 1.715 10/01/2021    Therapeutic Level Labs: No results found for: "LITHIUM" No results found for: "CBMZ" No results found for: "VALPROATE"  Current Medications: Current Outpatient Medications  Medication Sig Dispense Refill   mirtazapine (REMERON) 15 MG tablet Take 1 tablet (15 mg total) by mouth at bedtime. 30 tablet 1   hydrOXYzine (ATARAX) 25 MG tablet Take 1 tablet (25 mg  total) by mouth 3 (three) times daily as needed for anxiety. 30 tablet 0   nicotine (NICODERM CQ - DOSED IN MG/24 HOURS) 21 mg/24hr patch Place 1 patch (21 mg total) onto the skin daily. 28 patch 0   No current facility-administered medications for this visit.    Musculoskeletal: Strength & Muscle Tone: within normal limits Gait & Station: normal Patient leans: N/A  Psychiatric Specialty Exam: Review of Systems  Psychiatric/Behavioral:  Positive for dysphoric mood and sleep disturbance. Negative for decreased concentration, hallucinations, self-injury and suicidal ideas. The patient is nervous/anxious. The patient is not hyperactive.     Blood pressure 118/80, pulse (!) 54, height 5\' 6"  (1.676 m), weight 136 lb 12.8 oz (62.1 kg), SpO2 100%.Body mass  index is 22.08 kg/m.  General Appearance: Casual  Eye Contact:  Good  Speech:  Clear and Coherent and Normal Rate  Volume:  Normal  Mood:  Anxious and Depressed  Affect:  Congruent  Thought Process:  Coherent and Descriptions of Associations: Intact  Orientation:  Full (Time, Place, and Person)  Thought Content:  Logical  Suicidal Thoughts:  No  Homicidal Thoughts:  No  Memory:  Immediate;   Good Recent;   Good Remote;   Fair  Judgement:  Good  Insight:  Good  Psychomotor Activity:  Normal  Concentration:  Concentration: Good and Attention Span: Good  Recall:  Good  Fund of Knowledge:Good  Language: Good  Akathisia:  No  Handed:  Right  AIMS (if indicated):  not done  Assets:  Communication Skills Desire for Improvement Housing  ADL's:  Impaired  Cognition: WNL  Sleep:  Fair   Screenings: AIMS    Flowsheet Row [facility]  AIMS Total Score 0      AUDIT    Flowsheet Row [facility]) [facility] Admission (Discharged) from 02/18/2023 in [facility]  [facility]) [facility] ([facility]) [facility] ([facility]  Alcohol Use Disorder Identification Test Final Score (AUDIT) 0 0 0 0 0      GAD-7    Flowsheet Row Office Visit from 04/13/2023 in [facility] Office Visit from 10/14/2021 in [facility]  Total GAD-7 Score 16 9      PHQ2-9    Flowsheet Row Office Visit from 04/13/2023 in [facility] Office Visit from 10/14/2021 in [facility] ED from 09/28/2021 in [facility] Emergency Department at [facility] Office Visit from 09/03/2014 in [facility] [place]  PHQ-2 Total Score 4 4 5  0  PHQ-9 Total Score 11 14 19  --      Flowsheet Row Office Visit from 04/13/2023 in [facility] Admission (Discharged) [facility] from 03/22/2023 in [facility] Department at [facility]  C-SSRS RISK CATEGORY Low Risk Low Risk Low Risk       Assessment and Plan:   [name] is a 52 years old male with a past psychiatric history significant for depression who presents to [facility] to reestablish psychiatric care and for medication management.  Patient was previously seen at this facility and was last seen by Dr. Doyne Keel on 10/14/2021.  Patient was most recently admitted to [facility] on 03/23/2023 due to suicidal ideations in the setting of worsening depression.  Patient was discharged from [facility] on 03/27/2023 on the following psychiatric medications:  Mirtazapine 15 mg at bedtime Hydroxyzine 25 mg 3 times daily as needed  Patient reports  that he continues to take his mirtazapine as prescribed.  Despite taking his mirtazapine as prescribed, patient continues to endorse depression and anxiety attributed to multiple stressors in his life.  Patient endorses the following stressors: upcoming court appointment, lack of income, unemployment, and legal issues.  Patient reports that he was recently denied food stamps and states that he barely have any food notes house.  Patient was informed that in order to receive food stamps, he last volunteer of specific amount of hours.  Patient is currently volunteering at [employer] but it appears that he is not currently working enough hours to qualify for food stamps.  Despite his worsening depression and anxiety, patient would like to continue taking his mirtazapine as prescribed.  Patient's medication to be prescribed to pharmacy of choice.  In addition to his ongoing stressors, patient states that he is in chronic pain due to his back issues.  He reports that he was referred to a pain clinic and [facility]; however, he does not have any mode of transport.  Provider to refer patient out to pain management in [place].  Collaboration of Care: Medication Management AEB provider managing patient's psychiatric medications, Psychiatrist AEB patient being followed by mental health provider at this facility, and Referral or follow-up with counselor/therapist AEB patient being set up with a licensed clinical social worker at this facility  Patient/Guardian was advised Release of Information must be obtained prior to any record release in order to collaborate their care with an outside provider. Patient/Guardian was advised if they have not already done so to contact the registration department to sign all necessary forms in order for [place] to release information regarding their care.   Consent: Patient/Guardian gives verbal consent for treatment and assignment of benefits for services provided during this  visit. Patient/Guardian expressed understanding and agreed to proceed.   1. MDD (major depressive disorder), recurrent severe, without psychosis (HCC)  - mirtazapine (REMERON) 15 MG tablet; Take 1 tablet (15 mg total) by mouth at bedtime.  Dispense: 30 tablet; Refill: 1  2. Chronic bilateral thoracic back pain  - Ambulatory referral to Pain Clinic  3. Anxiety state  Patient to follow up in 6 weeks Provider spent a total of 54 minutes with the patient/reviewing patient's chart  Meta Hatchet, PA 12/11/20241:01 PM

## 2023-04-15 ENCOUNTER — Ambulatory Visit (INDEPENDENT_AMBULATORY_CARE_PROVIDER_SITE_OTHER): Payer: Commercial Managed Care - HMO | Admitting: Podiatry

## 2023-04-15 DIAGNOSIS — Z91199 Patient's noncompliance with other medical treatment and regimen due to unspecified reason: Secondary | ICD-10-CM

## 2023-04-15 NOTE — Progress Notes (Signed)
Patient absent for apointment  If patient to be rescheduled should be with another provider that offers routine care. Thanks

## 2023-04-16 ENCOUNTER — Encounter (HOSPITAL_COMMUNITY): Payer: Self-pay | Admitting: Physician Assistant

## 2023-04-21 ENCOUNTER — Encounter (HOSPITAL_COMMUNITY): Payer: Commercial Managed Care - HMO | Admitting: Physician Assistant

## 2023-04-21 ENCOUNTER — Encounter (HOSPITAL_COMMUNITY): Payer: Self-pay | Admitting: Physician Assistant

## 2023-04-30 ENCOUNTER — Ambulatory Visit
Admission: RE | Admit: 2023-04-30 | Discharge: 2023-04-30 | Disposition: A | Discharge status: Home / Self Care | Payer: Commercial Managed Care - HMO | Source: Ambulatory Visit | Attending: Registered Nurse | Admitting: Registered Nurse

## 2023-04-30 ENCOUNTER — Other Ambulatory Visit: Payer: Self-pay | Admitting: Registered Nurse

## 2023-04-30 DIAGNOSIS — M545 Low back pain, unspecified: Secondary | ICD-10-CM

## 2023-04-30 DIAGNOSIS — G8929 Other chronic pain: Secondary | ICD-10-CM

## 2023-05-31 ENCOUNTER — Ambulatory Visit (HOSPITAL_COMMUNITY): Payer: Commercial Managed Care - HMO | Admitting: Clinical

## 2023-06-28 ENCOUNTER — Ambulatory Visit: Payer: Commercial Managed Care - HMO | Admitting: Physical Medicine and Rehabilitation

## 2023-06-29 ENCOUNTER — Other Ambulatory Visit: Payer: Self-pay

## 2023-06-29 ENCOUNTER — Emergency Department (EMERGENCY_DEPARTMENT_HOSPITAL)
Admission: EM | Admit: 2023-06-29 | Discharge: 2023-06-30 | Disposition: A | Discharge status: Other Acute Inpatient Hospital | Payer: Commercial Managed Care - HMO | Source: Home / Self Care | Attending: Emergency Medicine | Admitting: Emergency Medicine

## 2023-06-29 ENCOUNTER — Encounter (HOSPITAL_COMMUNITY): Payer: Self-pay | Admitting: Emergency Medicine

## 2023-06-29 DIAGNOSIS — F339 Major depressive disorder, recurrent, unspecified: Secondary | ICD-10-CM | POA: Insufficient documentation

## 2023-06-29 DIAGNOSIS — F1011 Alcohol abuse, in remission: Secondary | ICD-10-CM | POA: Diagnosis not present

## 2023-06-29 DIAGNOSIS — F332 Major depressive disorder, recurrent severe without psychotic features: Secondary | ICD-10-CM | POA: Diagnosis not present

## 2023-06-29 DIAGNOSIS — R45851 Suicidal ideations: Secondary | ICD-10-CM | POA: Insufficient documentation

## 2023-06-29 DIAGNOSIS — F1010 Alcohol abuse, uncomplicated: Secondary | ICD-10-CM | POA: Insufficient documentation

## 2023-06-29 DIAGNOSIS — F333 Major depressive disorder, recurrent, severe with psychotic symptoms: Secondary | ICD-10-CM | POA: Diagnosis present

## 2023-06-29 DIAGNOSIS — F32A Depression, unspecified: Secondary | ICD-10-CM

## 2023-06-29 LAB — CBC
HCT: 40.6 % (ref 39.0–52.0)
Hemoglobin: 13.1 g/dL (ref 13.0–17.0)
MCH: 31 pg (ref 26.0–34.0)
MCHC: 32.3 g/dL (ref 30.0–36.0)
MCV: 96 fL (ref 80.0–100.0)
Platelets: 244 10*3/uL (ref 150–400)
RBC: 4.23 MIL/uL (ref 4.22–5.81)
RDW: 13.3 % (ref 11.5–15.5)
WBC: 7.1 10*3/uL (ref 4.0–10.5)
nRBC: 0 % (ref 0.0–0.2)

## 2023-06-29 LAB — COMPREHENSIVE METABOLIC PANEL
ALT: 9 U/L (ref 0–44)
AST: 17 U/L (ref 15–41)
Albumin: 4.3 g/dL (ref 3.5–5.0)
Alkaline Phosphatase: 34 U/L — ABNORMAL LOW (ref 38–126)
Anion gap: 10 (ref 5–15)
BUN: 9 mg/dL (ref 6–20)
CO2: 30 mmol/L (ref 22–32)
Calcium: 9.5 mg/dL (ref 8.9–10.3)
Chloride: 99 mmol/L (ref 98–111)
Creatinine, Ser: 1.1 mg/dL (ref 0.61–1.24)
GFR, Estimated: >60 mL/min (ref >60)
Glucose, Bld: 98 mg/dL (ref 70–99)
Potassium: 3.4 mmol/L — ABNORMAL LOW (ref 3.5–5.1)
Sodium: 139 mmol/L (ref 135–145)
Total Bilirubin: 0.7 mg/dL (ref 0.0–1.2)
Total Protein: 7 g/dL (ref 6.5–8.1)

## 2023-06-29 LAB — RAPID URINE DRUG SCREEN, HOSP PERFORMED
Amphetamines: NOT DETECTED
Barbiturates: NOT DETECTED
Benzodiazepines: NOT DETECTED
Cocaine: NOT DETECTED
Opiates: NOT DETECTED
Tetrahydrocannabinol: POSITIVE — AB

## 2023-06-29 LAB — ETHANOL: Alcohol, Ethyl (B): <10 mg/dL (ref <10)

## 2023-06-29 LAB — ACETAMINOPHEN LEVEL: Acetaminophen (Tylenol), Serum: <10 ug/mL — ABNORMAL LOW (ref 10–30)

## 2023-06-29 LAB — SALICYLATE LEVEL: Salicylate Lvl: <7 mg/dL — ABNORMAL LOW (ref 7.0–30.0)

## 2023-06-29 MED ORDER — DULOXETINE HCL 20 MG PO CPEP
20.0000 mg | ORAL_CAPSULE | Freq: Every day | ORAL | Status: DC
  Filled 2023-06-29: qty 1

## 2023-06-29 MED ORDER — LIDOCAINE 5 % EX PTCH
1.0000 | MEDICATED_PATCH | CUTANEOUS | Status: DC
  Administered 2023-06-29: 1 via TRANSDERMAL
  Filled 2023-06-29: qty 1

## 2023-06-29 NOTE — ED Notes (Signed)
 Pt took a shower and dressed into new clothes.

## 2023-06-29 NOTE — ED Provider Triage Note (Signed)
 Emergency Medicine Provider Triage Evaluation Note  [name] , a 53 y.o. male  was evaluated in triage.  Pt complains of suicidal thoughts.  States that he is depressed because he is always in pain and is having financial problems as well.  Review of Systems  Positive: Back pain, suicidal thoughts Negative: fever  Physical Exam  BP 113/65 (BP Location: Right Arm)   Pulse 76   Temp 98 F (36.7 C)   Resp 17   Wt 62.1 kg   SpO2 98%   BMI 22.10 kg/m  Gen:   Awake, no distress   Resp:  Normal effort  MSK:   Moves extremities without difficulty  Other:    Medical Decision Making  Medically screening exam initiated at 2:53 AM.  Appropriate orders placed.  Durelle Zepeda was informed that the remainder of the evaluation will be completed by another provider, this initial triage assessment does not replace that evaluation, and the importance of remaining in the ED until their evaluation is complete.     Roxy Horseman, PA-C 06/29/23 [phone]

## 2023-06-29 NOTE — Consult Note (Signed)
 [facility] Psychiatric Consult Initial  Patient Name: .Zaki Gertsch  MRN: [id number]  DOB: November 19, 1970  Consult Order details:  Orders (From admission, onward)     Start     Ordered   06/29/23 0307  CONSULT TO CALL ACT TEAM       Ordering Provider: Roxy Horseman, PA-C  Provider:  (Not yet assigned)  Question:  Reason for Consult?  Answer:  Psych consult   06/29/23 0306             Mode of Visit: Tele-visit Virtual Statement:TELE PSYCHIATRY ATTESTATION & CONSENT As the provider for this telehealth consult, I attest that I verified the patient's identity using two separate identifiers, introduced myself to the patient, provided my credentials, disclosed my location, and performed this encounter via a HIPAA-compliant, real-time, face-to-face, two-way, interactive audio and video platform and with the full consent and agreement of the patient (or guardian as applicable.) Patient physical location: [facility]. Telehealth provider physical location: home office in state of [place].   Video start time: 1538 Video end time: 1610    Psychiatry Consult Evaluation  Service Date: June 29, 2023 LOS:  LOS: 0 days  Chief Complaint "I'm not feeling that good and I don't trust myself to be alone."  Primary Psychiatric Diagnoses  Major Depressive Disorder, recurrent, severe without psychosis 2.  Alcohol Abuse, in remission   Assessment  Chirstopher Towell is a 53 y.o. male admitted: Presented to the EDfor 06/29/2023  2:09 AM for evaluation of passive suicidal thought. He carries the psychiatric diagnoses of MDD and alcohol abuse, remission, polysubstance and has a past medical history of  Chronic back pain, hip pain cellulitis.   His current presentation of depressed mood, sadness, suicidal thoughts, feelings of guilt or that he is a failure most consistent with major depressive disorder most days of the week for >2 weeks. He meets criteria for psychiatric admission based on above.  Current  outpatient psychotropic medications include mirtazapine and historically he has had a subtherapeutic response to these medications. He was non-compliant with medications prior to admission as evidenced by patient admission.   On initial examination, patient is alert and oriented x4; he endorses passive suicidal ideation, states he has thought of what he could but then is guarded and denies plan or intent.    Please see plan below for detailed recommendations.   Diagnoses:  Active Hospital problems: Active Problems:   Major depressive disorder, recurrent, severe with psychotic features (HCC)   Alcohol abuse, in remission    Plan   ## Psychiatric Medication Recommendations:  D/C Mirtazapine as he did not find it helpful Start Duloxetine 20mg  po daily for depression/chronic pain Hydroxyzine 25mg  po TID prn anxiety  ## Medical Decision Making Capacity: Not specifically addressed in this encounter  ## Further Work-up:  EKG -- most recent EKG on 03/23/2023 had QtC of 451 -- Pertinent labwork reviewed earlier this admission includes: CMP,CBC, UDS   ## Disposition:-- We recommend inpatient psychiatric hospitalization when medically cleared. Patient is under voluntary admission status at this time; please IVC if attempts to leave hospital.  ## Behavioral / Environmental: - No specific recommendations at this time.     ## Safety and Observation Level:  - Based on my clinical evaluation, I estimate the patient to be at low risk of self harm in the current setting. - At this time, we recommend  routine. This decision is based on my review of the chart including patient's history and current presentation, interview  of the patient, mental status examination, and consideration of suicide risk including evaluating suicidal ideation, plan, intent, suicidal or self-harm behaviors, risk factors, and protective factors. This judgment is based on our ability to directly address suicide risk, implement  suicide prevention strategies, and develop a safety plan while the patient is in the clinical setting. Please contact our team if there is a concern that risk level has changed.  CSSR Risk Category:C-SSRS RISK CATEGORY: Low Risk  Suicide Risk Assessment: Patient has following modifiable risk factors for suicide: untreated depression, which we are addressing by voluntary referral for inpatient psychiatric admission. Patient has following non-modifiable or demographic risk factors for suicide: psychiatric hospitalization Patient has the following protective factors against suicide: no history of suicide attempts  Thank you for this consult request. Recommendations have been communicated to the primary team.  We will sign off at this time.   Chales Abrahams, NP       History of Present Illness  Relevant Aspects of Hospital ED Course:  Admitted on 06/29/2023 for  Per RN Triage Note dated 06/29/2023@0219 : "Pt in with reports of SI, states he has been going through a lot of stress lately. No plan, no hallucinations. Denies ETOH or substances "  Patient Report:  Patient states he has not been in a good place lately.  He reports mounting financial and medical concerns, states most of the time he is not in a good head space and chronic pain makes it worse.  He reports prior to admission, he started looking at his sentimental table and began grieving his personal losses.  He states he felt like he was [place] "crack." He reports he began having suicidal thoughts, no plan but felt he needed to reach out for help.  He states he does not want to end his life.  He reports he has 2 adult daughters that make life work living.  States he wants to do well for them. He reports he used to be an alcoholic but no longer drinks, but does "smoke weed."  He has chronic back pain that limits his ability to work. 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  States he is trying for disability  but   He lives alone, he is on section 8 which helps with rent.   Psych ROS:  Depression: yes Anxiety:  denies Mania (lifetime and current): denies Psychosis: (lifetime and current): denies  Collateral information:  Deferred as patient provided sufficient HPI  Review of Systems  Constitutional: Negative.   HENT: Negative.    Eyes: Negative.   Respiratory: Negative.    Cardiovascular: Negative.   Gastrointestinal: Negative.   Genitourinary: Negative.   Musculoskeletal:  Positive for back pain (chronic denies new or worse sx today).  Skin: Negative.   Neurological: Negative.   Endo/Heme/Allergies: Negative.   Psychiatric/Behavioral:  Positive for depression, substance abuse (currently uses marijuana daily) and suicidal ideas.      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 Living Situation: lives  alone Spiritual Hx: Christian Access to weapons/lethal means: denies   Substance History Alcohol: 10 years sober  Tobacco: denies Illicit drugs: smokes about 12 cigarettes and smokes marijuana daily  Prescription drug abuse: pt denies Rehab hx: several alcohol rehabs  Exam Findings  Physical Exam:  Vital Signs:  Temp:  [98 F (36.7 C)] 98 F (36.7 C) (02/25 1840) Pulse Rate:  [63-76] 63 (02/25 1840) Resp:  [16-17] 16 (02/25 1840) BP: (113-130)/(65-77) 130/77 (02/25 1840) SpO2:  [98 %-100 %] 100 % (02/25 1842) Weight:  [62.1 kg] 62.1 kg (02/25 0221) Blood  pressure 130/77, pulse 63, temperature 98 F (36.7 C), temperature source Oral, resp. rate 16, weight 62.1 kg, SpO2 100%. Body mass index is 22.1 kg/m.  Physical Exam Constitutional:      Appearance: Normal appearance.  Cardiovascular:     Rate and Rhythm: Normal rate.     Pulses: Normal pulses.  Musculoskeletal:        General: Normal range of motion.     Cervical back: Normal range of motion.  Neurological:     Mental Status: He is alert and oriented to person, place, and time.  Psychiatric:        Attention and Perception: Attention and perception normal.        Mood and Affect: Mood is depressed. Affect is labile.        Speech: Speech normal.        Behavior: Behavior normal. Behavior is cooperative.        Thought Content: Thought content is not paranoid or delusional. Thought content includes suicidal ideation. Thought content does not include homicidal ideation. Thought content does not include homicidal or suicidal plan.        Cognition and Memory: Cognition and memory normal.        Judgment: Judgment normal.     Mental Status Exam: General Appearance: Casual  Orientation:  Full (Time, Place, and Person)  Memory:  Immediate;   Good Recent;   Good Remote;   Good  Concentration:  Concentration: Fair and Attention Span: Fair  Recall:  Good  Attention  Fair  Eye Contact:  Good  Speech:  Normal Rate  Language:  Good  Volume:  Decreased  Mood: "I'm depressed"  Affect:  Congruent and Depressed  Thought Process:  Coherent  Thought Content:  Rumination  Suicidal Thoughts:  Yes.  without intent/plan  Homicidal Thoughts:  No  Judgement:  Good  Insight:  Fair  Psychomotor Activity:  Normal  Akathisia:  No  Fund of Knowledge:  Good      Assets:  Communication Skills Desire for Improvement Housing Resilience  Cognition:  WNL  ADL's:  Intact  AIMS (if indicated):        Other History   These have been pulled in through the EMR, reviewed, and updated if  appropriate.  Family History:  The patient's family history includes Hypertension in his father, mother, and another family member.  Medical History: Past Medical History:  Diagnosis Date   Alcohol abuse    Anemia    Colitis    Severe major depression with psychotic features [facility])     Surgical History: History reviewed. No pertinent surgical history.   Medications:   Current Facility-Administered Medications:    DULoxetine (CYMBALTA) DR capsule 20 mg, 20 mg, Oral, Daily, Ophelia Shoulder E, NP   lidocaine (LIDODERM) 5 % 1 patch, 1 patch, Transdermal, Q24H, Elson Areas, [place], 1 patch at 06/29/23 2003  Current Outpatient Medications:  hydrOXYzine (ATARAX) 25 MG tablet, Take 1 tablet (25 mg total) by mouth 3 (three) times daily as needed for anxiety. (Patient not taking: Reported on 06/29/2023), Disp: 30 tablet, Rfl: 0   mirtazapine (REMERON) 15 MG tablet, Take 1 tablet (15 mg total) by mouth at bedtime. (Patient not taking: Reported on 06/29/2023), Disp: 30 tablet, Rfl: 1  Allergies: Allergies  Allergen Reactions   Aspirin Hives and Nausea And Vomiting   Ibuprofen Hives and Nausea And Vomiting   Pork-Derived Products Other (See Comments)    Stomach pain. Pt does not eat pork   Acetaminophen Nausea Only   Sertraline Diarrhea and Other (See Comments)    Acute GI distress with diarrhea and pain.     Chales Abrahams, NP

## 2023-06-29 NOTE — Consult Note (Signed)
 Patient was evaluated by psychiatry, and referred for inpatient psychiatric admission.    Complete psychiatric consult note pending

## 2023-06-29 NOTE — ED Notes (Signed)
 Lunch ordered

## 2023-06-29 NOTE — ED Provider Notes (Addendum)
 [place] EMERGENCY DEPARTMENT AT [facility] Provider Note   CSN: [id number] Arrival date & time: 06/29/23  0125     History  Chief Complaint  Patient presents with   Suicidal    [name] is a 53 y.o. male.  Patient complains of feeling unsafe.  Patient states he does not feel like he can trust himself to not harm himself.  Patient reports that he has been depressed.  Patient states he is thinking about suicide.  Patient states he has had similar episodes of feeling this way in the past.  Patient states he has had previous inpatient hospitalizations.  Patient is here requesting inpatient hospitalization.  Patient reports he has chronic pain.  Patient reports he cannot make it to the appointments with his psychiatrist because he has no transportation.  He reports he is not currently in any treatment.  The history is provided by the patient. No language interpreter was used.       Home Medications Prior to Admission medications   Medication Sig Start Date End Date Taking? Authorizing Provider  hydrOXYzine (ATARAX) 25 MG tablet Take 1 tablet (25 mg total) by mouth 3 (three) times daily as needed for anxiety. 03/27/23   Lance Muss, MD  mirtazapine (REMERON) 15 MG tablet Take 1 tablet (15 mg total) by mouth at bedtime. 04/13/23   Nwoko, Tommas Olp, PA  nicotine (NICODERM CQ - DOSED IN MG/24 HOURS) 21 mg/24hr patch Place 1 patch (21 mg total) onto the skin daily. 03/28/23   Lance Muss, MD      Allergies    Acetaminophen, Aspirin, Ibuprofen, Pork-derived products, and Sertraline    Review of Systems   Review of Systems  All other systems reviewed and are negative.   Physical Exam Updated Vital Signs BP 113/65 (BP Location: Right Arm)   Pulse 76   Temp 98 F (36.7 C)   Resp 17   Wt 62.1 kg   SpO2 98%   BMI 22.10 kg/m  Physical Exam Vitals and nursing note reviewed.  Constitutional:      Appearance: He is well-developed.  HENT:     Head:  Normocephalic.     Mouth/Throat:     Mouth: Mucous membranes are moist.  Cardiovascular:     Rate and Rhythm: Normal rate.  Pulmonary:     Effort: Pulmonary effort is normal.  Abdominal:     General: There is no distension.  Musculoskeletal:        General: Normal range of motion.     Cervical back: Normal range of motion.  Skin:    General: Skin is warm.  Neurological:     General: No focal deficit present.     Mental Status: He is alert and oriented to person, place, and time.  Psychiatric:        Mood and Affect: Mood normal.     ED Results / Procedures / Treatments   Labs (all labs ordered are listed, but only abnormal results are displayed) Labs Reviewed  COMPREHENSIVE METABOLIC PANEL - Abnormal; Notable for the following components:      Result Value   Potassium 3.4 (*)    Alkaline Phosphatase 34 (*)    All other components within normal limits  SALICYLATE LEVEL - Abnormal; Notable for the following components:   Salicylate Lvl <7.0 (*)    All other components within normal limits  ACETAMINOPHEN LEVEL - Abnormal; Notable for the following components:   Acetaminophen (Tylenol), Serum <10 (*)  All other components within normal limits  RAPID URINE DRUG SCREEN, HOSP PERFORMED - Abnormal; Notable for the following components:   Tetrahydrocannabinol POSITIVE (*)    All other components within normal limits  ETHANOL  CBC    EKG None  Radiology No results found.  Procedures Procedures    Medications Ordered in ED Medications - No data to display  ED Course/ Medical Decision Making/ A&P                                 Medical Decision Making Patient complains of suicidal thoughts.  He reports he does not feel safe going home.  Patient reports he has had similar episodes in the past and has required hospitalization.  Patient reports he has not been able to get to his psychiatrist because he has transportation issues.  Amount and/or Complexity of Data  Reviewed Labs: ordered. Decision-making details documented in ED Course.    Details: Labs ordered reviewed and interpreted.  Patient is positive for THC.  Risk Risk Details: TTS consulted.        I spoke to TTS NP.  Pt is awaiting in patient treatment   Final Clinical Impression(s) / ED Diagnoses Final diagnoses:  Suicidal thoughts    Rx / DC Orders ED Discharge Orders     None         Elson Areas, PA-C 06/29/23 1643    Elson Areas, [place] 06/29/23 1753    Virgina Norfolk, DO 06/29/23 1818    Elson Areas, PA-C 06/29/23 1901    Virgina Norfolk, DO 06/29/23 2256

## 2023-06-29 NOTE — ED Triage Notes (Signed)
 Pt in with reports of SI, states he has been going through a lot of stress lately. No plan, no hallucinations. Denies ETOH or substances

## 2023-06-29 NOTE — BHH Counselor (Signed)
 Social Work Disposition Note:    Ophelia Shoulder, NP, recommended inpatient psychiatric treatment. The patient was referred to [facility] Selena Batten, RN, for review. Per,  Selena Batten, RN, the patient has been accepted to [facility] 402-1, pending voluntary consent, vital signs (VS), and an EKG. The patient can arrive after the change of shift. Patient's care team provided updates.

## 2023-06-30 ENCOUNTER — Inpatient Hospital Stay (HOSPITAL_COMMUNITY)
Admission: AD | Admit: 2023-06-30 | Discharge: 2023-07-04 | DRG: 885 | Disposition: A | Discharge status: Home / Self Care | Payer: Commercial Managed Care - HMO | Source: Intra-hospital | Attending: Psychiatry | Admitting: Psychiatry

## 2023-06-30 ENCOUNTER — Encounter (HOSPITAL_COMMUNITY): Payer: Self-pay

## 2023-06-30 ENCOUNTER — Encounter (HOSPITAL_COMMUNITY): Payer: Self-pay | Admitting: Adult Health

## 2023-06-30 DIAGNOSIS — Z5941 Food insecurity: Secondary | ICD-10-CM | POA: Diagnosis not present

## 2023-06-30 DIAGNOSIS — F1011 Alcohol abuse, in remission: Secondary | ICD-10-CM | POA: Diagnosis present

## 2023-06-30 DIAGNOSIS — M545 Low back pain, unspecified: Secondary | ICD-10-CM | POA: Diagnosis present

## 2023-06-30 DIAGNOSIS — Z888 Allergy status to other drugs, medicaments and biological substances status: Secondary | ICD-10-CM

## 2023-06-30 DIAGNOSIS — Z5986 Financial insecurity: Secondary | ICD-10-CM | POA: Diagnosis not present

## 2023-06-30 DIAGNOSIS — Z560 Unemployment, unspecified: Secondary | ICD-10-CM | POA: Diagnosis not present

## 2023-06-30 DIAGNOSIS — F1290 Cannabis use, unspecified, uncomplicated: Secondary | ICD-10-CM | POA: Diagnosis present

## 2023-06-30 DIAGNOSIS — Z91014 Allergy to mammalian meats: Secondary | ICD-10-CM

## 2023-06-30 DIAGNOSIS — Z5982 Transportation insecurity: Secondary | ICD-10-CM | POA: Diagnosis not present

## 2023-06-30 DIAGNOSIS — Z811 Family history of alcohol abuse and dependence: Secondary | ICD-10-CM | POA: Diagnosis not present

## 2023-06-30 DIAGNOSIS — Z716 Tobacco abuse counseling: Secondary | ICD-10-CM | POA: Diagnosis not present

## 2023-06-30 DIAGNOSIS — Z79899 Other long term (current) drug therapy: Secondary | ICD-10-CM

## 2023-06-30 DIAGNOSIS — Z638 Other specified problems related to primary support group: Secondary | ICD-10-CM

## 2023-06-30 DIAGNOSIS — F17210 Nicotine dependence, cigarettes, uncomplicated: Secondary | ICD-10-CM | POA: Diagnosis present

## 2023-06-30 DIAGNOSIS — Z886 Allergy status to analgesic agent status: Secondary | ICD-10-CM

## 2023-06-30 DIAGNOSIS — R45851 Suicidal ideations: Secondary | ICD-10-CM | POA: Diagnosis present

## 2023-06-30 DIAGNOSIS — G8929 Other chronic pain: Secondary | ICD-10-CM | POA: Diagnosis present

## 2023-06-30 DIAGNOSIS — F339 Major depressive disorder, recurrent, unspecified: Secondary | ICD-10-CM | POA: Diagnosis not present

## 2023-06-30 DIAGNOSIS — Z91148 Patient's other noncompliance with medication regimen for other reason: Secondary | ICD-10-CM

## 2023-06-30 DIAGNOSIS — E876 Hypokalemia: Secondary | ICD-10-CM | POA: Diagnosis present

## 2023-06-30 DIAGNOSIS — F333 Major depressive disorder, recurrent, severe with psychotic symptoms: Principal | ICD-10-CM | POA: Diagnosis present

## 2023-06-30 DIAGNOSIS — F332 Major depressive disorder, recurrent severe without psychotic features: Secondary | ICD-10-CM | POA: Diagnosis not present

## 2023-06-30 DIAGNOSIS — Z8249 Family history of ischemic heart disease and other diseases of the circulatory system: Secondary | ICD-10-CM

## 2023-06-30 MED ORDER — DIPHENHYDRAMINE HCL 25 MG PO CAPS: 50.0000 mg | ORAL_CAPSULE | Freq: Three times a day (TID) | ORAL | Status: DC | PRN

## 2023-06-30 MED ORDER — LIDOCAINE 5 % EX PTCH
1.0000 | MEDICATED_PATCH | CUTANEOUS | Status: DC
Start: 2023-06-30 — End: 2023-07-04
  Filled 2023-06-30 (×6): qty 1

## 2023-06-30 MED ORDER — DULOXETINE HCL 30 MG PO CPEP
30.0000 mg | ORAL_CAPSULE | Freq: Every day | ORAL | Status: DC
  Administered 2023-07-03 – 2023-07-04 (×2): 30 mg via ORAL
  Filled 2023-06-30 (×6): qty 1

## 2023-06-30 MED ORDER — DULOXETINE HCL 20 MG PO CPEP
20.0000 mg | ORAL_CAPSULE | Freq: Every day | ORAL | Status: DC
  Administered 2023-06-30: 20 mg via ORAL
  Filled 2023-06-30 (×3): qty 1

## 2023-06-30 MED ORDER — ALUM & MAG HYDROXIDE-SIMETH 200-200-20 MG/5ML PO SUSP: 30.0000 mL | ORAL | Status: DC | PRN

## 2023-06-30 MED ORDER — MAGNESIUM HYDROXIDE 400 MG/5ML PO SUSP: 30.0000 mL | Freq: Every day | ORAL | Status: DC | PRN

## 2023-06-30 MED ORDER — HALOPERIDOL 5 MG PO TABS: 5.0000 mg | ORAL_TABLET | Freq: Three times a day (TID) | ORAL | Status: DC | PRN

## 2023-06-30 MED ORDER — ACETAMINOPHEN 325 MG PO TABS: 650.0000 mg | ORAL_TABLET | Freq: Four times a day (QID) | ORAL | Status: DC | PRN

## 2023-06-30 NOTE — BHH Suicide Risk Assessment (Signed)
 [facility] Admission Suicide Risk Assessment   Nursing information obtained from:  Patient Demographic factors:  Male, Low socioeconomic status, Living alone, Unemployed Current Mental Status:  NA Loss Factors:  Decline in physical health, Financial problems / change in socioeconomic status Historical Factors:  Impulsivity Risk Reduction Factors:  Sense of responsibility to family  Total Time spent with patient: 20 minutes Principal Problem: MDD (major depressive disorder), recurrent episode, severe (HCC) Diagnosis:  Principal Problem:   MDD (major depressive disorder), recurrent episode, severe (HCC)  Subjective Data:  53 year old African-American male, single, unemployed, lives alone.  Background history of AUD in full remission, chronic pain and MDD recurrent perpetuated by pain.  Self-presented to the emergency room on account of worsening depression associated with pain.  Very focused on pain management, reported suicidal thoughts due to pain.  Continued Clinical Symptoms:  Alcohol Use Disorder Identification Test Final Score (AUDIT): 0 The "Alcohol Use Disorders Identification Test", Guidelines for Use in Primary Care, Second Edition.  World Science writer [facility]). Score between 0-7:  no or low risk or alcohol related problems. Score between 8-15:  moderate risk of alcohol related problems. Score between 16-19:  high risk of alcohol related problems. Score 20 or above:  warrants further diagnostic evaluation for alcohol dependence and treatment.   CLINICAL FACTORS:   Depression:   Comorbid alcohol abuse/dependence Chronic Pain   Musculoskeletal: Strength & Muscle Tone: within normal limits Gait & Station: normal Patient leans: N/A  Psychiatric Specialty Exam:  Presentation  General Appearance:  Slim build, limited grooming, no acute distress.  Appropriate behavior.  Eye Contact: Good.   Speech: Spontaneous, soft spoken.   Mood and Affect  Mood: Depressed.    Affect: Restricted and mood congruent.   Thought Process  Thought Processes: Goal directed.Descriptions of Associations: Intact.   Orientation:Full (Time, Place and Person)   Thought Content: Preoccupied with need for pain medication.  No negative rumination.  No guilty rumination.  No delusional theme.  No current suicidal thoughts.  No homicidal thoughts.  No thoughts of violence.  Hallucinations: No hallucinations in any modality.  Sensorium  Memory: Good.  Judgment: Good.   Insight: Good as patient is aware of his mental illness.  He wants to get help.  Executive Functions  Concentration: Good   Attention Span: Good.   Recall: Good.  Fund of Knowledge: Good.   Language: Good     Psychomotor Activity  Normal psychomotor activity  Physical Exam: Physical Exam ROS Blood pressure 131/80, pulse 75, temperature 98.3 F (36.8 C), temperature source Oral, resp. rate 18, height 5\' 6"  (1.676 m), weight 54 kg, SpO2 100%. Body mass index is 19.21 kg/m.   COGNITIVE FEATURES THAT CONTRIBUTE TO RISK:  None    SUICIDE RISK:   Minimal: No identifiable suicidal ideation.  Patients presenting with no risk factors but with morbid ruminations; may be classified as minimal risk based on the severity of the depressive symptoms  PLAN OF CARE:  Patient will be admitted on every 15 minute check.  We will initiate duloxetine to target depression.  There is evidence that this is useful for pain management.  I certify that inpatient services furnished can reasonably be expected to improve the patient's condition.   Georgiann Cocker, MD 06/30/2023, 1:08 PM

## 2023-06-30 NOTE — Plan of Care (Signed)

## 2023-06-30 NOTE — Progress Notes (Signed)
 [name] is a 53 y.o. male voluntarily admitted for suicide ideation without a plan. Pt would not explain what caused to have suicidal thoughts. Pt also complained of chronic back pain with unbalance gait, pt placed on high fall risk but refused yellow socks/armband. Pt denied SI/HI, AVH at the time of admission and contracted for safety. Consents signed, skin/belongings search completed and pt oriented to unit. Pt stable at this time. Pt given the opportunity to express concerns and ask questions. Pt given toiletries. Will continue to monitor.

## 2023-06-30 NOTE — Plan of Care (Signed)
 Pt presents with anxious affect and animated expression. Remains circumstantial and preoccupied. Reports poor sleep and fair appetite. Pt complains of depression and hopelessness rated 7/10 and agitation. Denies SI, HI, and AVH. Pt requested to get pants out of his locker and became irritated when the item was not in the locker or listed on the belongings sheet. Pt was able to be verbally redirected. Medication compliant with no adverse reactions. Safety checks maintained at q 15 minutes. Support, encouragement, and reassurance offered to the pt.   Problem: Education: Goal: Emotional status will improve Outcome: Progressing Goal: Mental status will improve Outcome: Progressing Goal: Verbalization of understanding the information provided will improve Outcome: Progressing   Problem: Safety: Goal: Periods of time without injury will increase Outcome: Progressing

## 2023-06-30 NOTE — Plan of Care (Signed)
  Problem: Education: Goal: Emotional status will improve Outcome: Progressing Goal: Mental status will improve Outcome: Progressing   Problem: Activity: Goal: Interest or engagement in activities will improve Outcome: Progressing Goal: Sleeping patterns will improve Outcome: Progressing   Problem: Coping: Goal: Ability to verbalize frustrations and anger appropriately will improve Outcome: Progressing   Problem: Safety: Goal: Periods of time without injury will increase Outcome: Progressing

## 2023-06-30 NOTE — BH IP Treatment Plan (Signed)
 Interdisciplinary Treatment and Diagnostic Plan Update  06/30/2023 Time of Session: 1120AM Unnamed Zeien MRN: [id number]  Principal Diagnosis: MDD (major depressive disorder), recurrent episode, severe (HCC)  Secondary Diagnoses: Principal Problem:   MDD (major depressive disorder), recurrent episode, severe (HCC)   Current Medications:  Current Facility-Administered Medications  Medication Dose Route Frequency Provider Last Rate Last Admin   alum & mag hydroxide-simeth (MAALOX/MYLANTA) 200-200-20 MG/5ML suspension 30 mL  30 mL Oral Q4H PRN Ophelia Shoulder E, NP       haloperidol (HALDOL) tablet 5 mg  5 mg Oral TID PRN Chales Abrahams, NP       And   diphenhydrAMINE (BENADRYL) capsule 50 mg  50 mg Oral TID PRN Chales Abrahams, NP       DULoxetine (CYMBALTA) DR capsule 20 mg  20 mg Oral Daily Ophelia Shoulder E, NP   20 mg at 06/30/23 0809   lidocaine (LIDODERM) 5 % 1 patch  1 patch Transdermal Q24H Ophelia Shoulder E, NP       magnesium hydroxide (MILK OF MAGNESIA) suspension 30 mL  30 mL Oral Daily PRN Chales Abrahams, NP       PTA Medications: Medications Prior to Admission  Medication Sig Dispense Refill Last Dose/Taking   hydrOXYzine (ATARAX) 25 MG tablet Take 1 tablet (25 mg total) by mouth 3 (three) times daily as needed for anxiety. (Patient not taking: Reported on 06/29/2023) 30 tablet 0     Patient Stressors: Financial difficulties   Substance abuse    Patient Strengths: Manufacturing systems engineer  Supportive family/friends  Work skills   Treatment Modalities: Medication Management, Group therapy, Case management,  1 to 1 session with clinician, Psychoeducation, Recreational therapy.   Physician Treatment Plan for Primary Diagnosis: MDD (major depressive disorder), recurrent episode, severe (HCC) Long Term Goal(s):     Short Term Goals:    Medication Management: Evaluate patient's response, side effects, and tolerance of medication regimen.  Therapeutic Interventions: 1 to  1 sessions, Unit Group sessions and Medication administration.  Evaluation of Outcomes: Not Progressing  Physician Treatment Plan for Secondary Diagnosis: Principal Problem:   MDD (major depressive disorder), recurrent episode, severe (HCC)  Long Term Goal(s):     Short Term Goals:       Medication Management: Evaluate patient's response, side effects, and tolerance of medication regimen.  Therapeutic Interventions: 1 to 1 sessions, Unit Group sessions and Medication administration.  Evaluation of Outcomes: Not Progressing   RN Treatment Plan for Primary Diagnosis: MDD (major depressive disorder), recurrent episode, severe (HCC) Long Term Goal(s): Knowledge of disease and therapeutic regimen to maintain health will improve  Short Term Goals: Ability to remain free from injury will improve, Ability to verbalize frustration and anger appropriately will improve, Ability to demonstrate self-control, and Ability to disclose and discuss suicidal ideas  Medication Management: RN will administer medications as ordered by provider, will assess and evaluate patient's response and provide education to patient for prescribed medication. RN will report any adverse and/or side effects to prescribing provider.  Therapeutic Interventions: 1 on 1 counseling sessions, Psychoeducation, Medication administration, Evaluate responses to treatment, Monitor vital signs and CBGs as ordered, Perform/monitor CIWA, COWS, AIMS and Fall Risk screenings as ordered, Perform wound care treatments as ordered.  Evaluation of Outcomes: Not Progressing   LCSW Treatment Plan for Primary Diagnosis: MDD (major depressive disorder), recurrent episode, severe (HCC) Long Term Goal(s): Safe transition to appropriate next level of care at discharge, Engage patient in therapeutic group addressing interpersonal concerns.  Short Term Goals: Engage patient in aftercare planning with referrals and resources, Increase ability to  appropriately verbalize feelings, Facilitate acceptance of mental health diagnosis and concerns, Facilitate patient progression through stages of change regarding substance use diagnoses and concerns, and Identify triggers associated with mental health/substance abuse issues  Therapeutic Interventions: Assess for all discharge needs, 1 to 1 time with Social worker, Explore available resources and support systems, Assess for adequacy in community support network, Educate family and significant other(s) on suicide prevention, Complete Psychosocial Assessment, Interpersonal group therapy.  Evaluation of Outcomes: Not Progressing   Progress in Treatment: Attending groups: Yes. Participating in groups: Yes. Taking medication as prescribed: Yes. Toleration medication: Yes. Family/Significant other contact made: No, will contact:  consents pending Patient understands diagnosis: Yes. Discussing patient identified problems/goals with staff: Yes. Medical problems stabilized or resolved: Yes. Denies suicidal/homicidal ideation: Yes. Issues/concerns per patient self-inventory: No.  New problem(s) identified: No, Describe:  none reported  New Short Term/Long Term Goal(s): medication stabilization, elimination of SI thoughts, development of comprehensive mental wellness plan.    Patient Goals:  "I need pain medicine, that's what leads me to having these thoughts"  Discharge Plan or Barriers: Patient recently admitted. CSW will continue to follow and assess for appropriate referrals and possible discharge planning.    Reason for Continuation of Hospitalization: Anxiety Depression Medication stabilization Suicidal ideation  Estimated Length of Stay: 3-4 days  Last 3 [place] Suicide Severity Risk Score: Flowsheet Row [facility]) [facility]/29[facility]2023 in [facility] Emergency Department at [facility] Office Visit from  [facility]13/2023 in [facility]  C-SSRS RISK CATEGORY No Risk Low Risk Low Risk       Last PHQ 2/9 Scores:    04/13/2023    2:25 PM 10/14/2021    8:22 AM 09/29/2021    4:03 AM  Depression screen PHQ 2/9  Decreased Interest 1 1 2   Down, Depressed, Hopeless 3 3 3   PHQ - 2 Score 4 4 5   Altered sleeping 0 2 2  Tired, decreased energy 0 2 2  Change in appetite 0 1 2  Feeling bad or failure about yourself  3 3 3   Trouble concentrating 1 1 2   Moving slowly or fidgety/restless 1 0 1  Suicidal thoughts 2 1 2   PHQ-9 Score 11 14 19   Difficult doing work/chores Very difficult Somewhat difficult Very difficult    Scribe for Treatment Team: Kathi Der, LCSWA 06/30/2023 1:01 PM

## 2023-06-30 NOTE — H&P (Signed)
 Psychiatric Admission Assessment Adult  Patient Identification: [name] MRN:  [id number] Date of Evaluation:  06/30/2023 Chief Complaint:  Worsening depression associated with suicidal thoughts. Principal Diagnosis: MDD (major depressive disorder), recurrent episode, severe (HCC) Diagnosis:  Principal Problem:   MDD (major depressive disorder), recurrent episode, severe (HCC)  History of Present Illness:  53 year old African-American male, single, unemployed, lives alone.  Background history of alcohol use disorder in full remission, chronic pain and MDD recurrent.  Self-presented to behavioral health urgent care on account of worsening depression in context of chronic pain.  Patient seeking pain management but expressed thoughts of suicide which led to psychiatric admission. Routine labs significant for mild hypokalemia.  UDS positive for THC.  No alcohol detected.  Chart reviewed today.  Patient discussed at multidisciplinary team meeting.  Nursing staff reports that patient has been focused on being prescribed medication for pain.  No challenging behavior on the unit.  Patient reports an extensive history of severe alcohol use disorder.  States that he has been sober for 10 years.  Patient has been treated for depression over the years.  States that he was recently investigated and found that he has a slipped disk.  Patient states that this has been the major cause of pain to his lower back.  States that the pain is debilitating to the point that he feels like life is not worth living anymore.  Patient reports having passive death wish but no active plans of harming himself.  Patient is very focused on being prescribed pain medication.  States that he is allergic to a lot of NSAIDs and Tylenol.  States that those type of medication do not work for him.  He is focused on getting opioids prescribed.  Patient reports worsening depression lately.  States that he has been dwelling on all  the negative things that have happened in his life.  He lost two of his children over the years.  States that he has two living children who do not have anything to do with him.  Patient states that he still has to pay back child support for his last child.  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  States that with his back pain has been difficult for him to work.  Patient reports being stressed by his financial situation and his physical ailments.  Pain affects his quality of sleep at night.  Patient reports use of THC.  No use of any other psychoactive substance.  There is no evidence of mania.  No evidence of psychosis.  No PTSD symptoms.  No evidence of OCD.  Review of symptoms essentially as above.   Total Time spent with patient: 1 hour  Past Psychiatric History:  Patient reports an extensive history of alcohol use disorder.  He reports associated mood symptoms.  He has been admitted repeatedly on account of alcohol-induced mood disorder and suicidal thoughts.  No past suicidal behavior.  No past self-injurious behavior. He has no recollection of medications that has been used to treat his depression.  States that depression is mostly related to pain since he stopped using alcohol. No past manic symptoms.  No past psychotic symptoms.  No past history of violent behavior. No past history of neuromodulation. Patient was seen rehab a couple of times.  Alcohol as primary drug of choice.   [place] Scale:  Flowsheet Row Ad[facility] (Current) [facility] from [facility]29/2023 in [facility]  Emergency Department at [facility] Office Visit from 04/13/2023 in [facility]  C-SSRS RISK CATEGORY No Risk Low Risk Low Risk        Alcohol Screening: 1. How often do you have a drink containing alcohol?: Never 2. How many drinks containing alcohol do you have on a typical day when you are  drinking?: 1 or 2 3. How often do you have six or more drinks on one occasion?: Never AUDIT-C Score: 0 4. How often during the last year have you found that you were not able to stop drinking once you had started?: Never 5. How often during the last year have you failed to do what was normally expected from you because of drinking?: Never 6. How often during the last year have you needed a first drink in the morning to get yourself going after a heavy drinking session?: Never 7. How often during the last year have you had a feeling of guilt of remorse after drinking?: Never 8. How often during the last year have you been unable to remember what happened the night before because you had been drinking?: Never 9. Have you or someone else been injured as a result of your drinking?: No 10. Has a relative or friend or a doctor or another health worker been concerned about your drinking or suggested you cut down?: No Alcohol Use Disorder Identification Test Final Score (AUDIT): 0 Alcohol Brief Interventions/Follow-up: Alcohol education/Brief advice  Past Medical History:  Past Medical History:  Diagnosis Date   Alcohol abuse    Anemia    Colitis    Severe major depression with psychotic features (HCC)    History reviewed. No pertinent surgical history. Family History:  Family History  Problem Relation Age of Onset   Hypertension Mother    Hypertension Father    Hypertension Other    Family Psychiatric  History:  Strong family history of alcohol use disorder on both sides of his family.  Both parents suffered from cirrhosis related to alcohol use disorder.  No other family history of mental illness.  No family history of suicide.  Tobacco Screening:  Social History   Tobacco Use  Smoking Status Every Day   Types: Cigarettes  Smokeless Tobacco Never    BH Tobacco Counseling     Are you interested in Tobacco Cessation Medications?  No, patient refused Counseled patient on smoking  cessation:  Yes Reason Tobacco Screening Not Completed: No value filed.       Social History:  Social History   Substance and Sexual Activity  Alcohol Use No   Comment: former  last use 5+ year ago     Social History   Substance and Sexual Activity  Drug Use Yes   Frequency: 5.0 times per week   Types: Marijuana   Comment: 10 years 11/03    Additional Social History: Patient was born and raised in [place].  He was raised by his biological parents.  His parents drank a lot, states that home environment was chaotic.  His parents separated when he was a teenager.  He was primarily with his mother.  Patient states that his mother passed when he was 43 years of age.  He lived with his father thereafter. Patient was poorly adjusted at school.  He never liked school.  He left at 10th grade.  He went to trade school and plans to become a Psychologist, occupational.  Patient has never been married.  He has a total  of 4 children.  2 of his children died years ago.  He has no communication with his living children.  He is estranged from his children's mother.  Patient is currently on section 8 housing.  He lives alone.  No steady income.  No supportive relationship.  No forensic history.  No military history.  Allergies:   Allergies  Allergen Reactions   Aspirin Hives and Nausea And Vomiting   Ibuprofen Hives and Nausea And Vomiting   Pork-Derived Products Other (See Comments)    Stomach pain. Pt does not eat pork   Acetaminophen Nausea Only   Sertraline Diarrhea and Other (See Comments)    Acute GI distress with diarrhea and pain.    Lab Results:  Results for orders placed or performed during the hospital encounter of 06/29/23 (from the past 48 hours)  Rapid urine drug screen (hospital performed)     Status: Abnormal   Collection Time: 06/29/23  2:22 AM  Result Value Ref Range   Opiates NONE DETECTED NONE DETECTED   Cocaine NONE DETECTED NONE DETECTED   Benzodiazepines NONE DETECTED NONE DETECTED    Amphetamines NONE DETECTED NONE DETECTED   Tetrahydrocannabinol POSITIVE (A) NONE DETECTED   Barbiturates NONE DETECTED NONE DETECTED    Comment: (NOTE) DRUG SCREEN FOR MEDICAL PURPOSES ONLY.  IF CONFIRMATION IS NEEDED FOR ANY PURPOSE, NOTIFY LAB WITHIN 5 DAYS.  LOWEST DETECTABLE LIMITS FOR URINE DRUG SCREEN Drug Class                     Cutoff (ng/mL) Amphetamine and metabolites    1000 Barbiturate and metabolites    200 Benzodiazepine                 200 Opiates and metabolites        300 Cocaine and metabolites        300 THC                            50 Performed at [facility], [address] [place], [place] [address]   Comprehensive metabolic panel     Status: Abnormal   Collection Time: 06/29/23  2:23 AM  Result Value Ref Range   Sodium 139 135 - 145 mmol/L   Potassium 3.4 (L) 3.5 - 5.1 mmol/L   Chloride 99 98 - 111 mmol/L   CO2 30 22 - 32 mmol/L   Glucose, Bld 98 70 - 99 mg/dL    Comment: Glucose reference range applies only to samples taken after fasting for at least 8 hours.   BUN 9 6 - 20 mg/dL   Creatinine, Ser 2.44 0.61 - 1.24 mg/dL   Calcium 9.5 8.9 - 01.0 mg/dL   Total Protein 7.0 6.5 - 8.1 g/dL   Albumin 4.3 3.5 - 5.0 g/dL   AST 17 15 - 41 U/L   ALT 9 0 - 44 U/L   Alkaline Phosphatase 34 (L) 38 - 126 U/L   Total Bilirubin 0.7 0.0 - 1.2 mg/dL   GFR, Estimated >27 >25 mL/min    Comment: (NOTE) Calculated using the CKD-EPI Creatinine Equation (2021)    Anion gap 10 5 - 15    Comment: Performed at [facility], [address], [place], [place] [address]  Ethanol     Status: None   Collection Time: 06/29/23  2:23 AM  Result Value Ref Range   Alcohol, Ethyl (B) <10 <10 mg/dL  Comment: (NOTE) Lowest detectable limit for serum alcohol is 10 mg/dL.  For medical purposes only. Performed at [facility], [address], [place], [place] [address]   Salicylate level     Status: Abnormal   Collection Time: 06/29/23  2:23 AM  Result  Value Ref Range   Salicylate Lvl <7.0 (L) 7.0 - 30.0 mg/dL    Comment: Performed at [facility], [address] [place], [place] [address]  Acetaminophen level     Status: Abnormal   Collection Time: 06/29/23  2:23 AM  Result Value Ref Range   Acetaminophen (Tylenol), Serum <10 (L) 10 - 30 ug/mL    Comment: (NOTE) Therapeutic concentrations vary significantly. A range of 10-30 ug/mL  may be an effective concentration for many patients. However, some  are best treated at concentrations outside of this range. Acetaminophen concentrations >150 ug/mL at 4 hours after ingestion  and >50 ug/mL at 12 hours after ingestion are often associated with  toxic reactions.  Performed at [facility], [address] [place], [place] [address]   cbc     Status: None   Collection Time: 06/29/23  2:23 AM  Result Value Ref Range   WBC 7.1 4.0 - 10.5 K/uL   RBC 4.23 4.22 - 5.81 MIL/uL   Hemoglobin 13.1 13.0 - 17.0 g/dL   HCT 13.0 86.5 - 78.4 %   MCV 96.0 80.0 - 100.0 fL   MCH 31.0 26.0 - 34.0 pg   MCHC 32.3 30.0 - 36.0 g/dL   RDW 69.6 29.5 - 28.4 %   Platelets 244 150 - 400 K/uL   nRBC 0.0 0.0 - 0.2 %    Comment: Performed at [facility], [address], [place], [place] [address]    Blood Alcohol level:  Lab Results  Component Value Date   [facility] <10 06/29/2023   ETH <10 03/22/2023    Metabolic Disorder Labs:  Lab Results  Component Value Date   HGBA1C 5.2 08/27/2014   MPG 103 08/27/2014   MPG 105 03/08/2013   No results found for: "PROLACTIN" No results found for: "CHOL", "TRIG", "HDL", "CHOLHDL", "VLDL", "LDLCALC"  Current Medications: Current Facility-Administered Medications  Medication Dose Route Frequency Provider Last Rate Last Admin   alum & mag hydroxide-simeth (MAALOX/MYLANTA) 200-200-20 MG/5ML suspension 30 mL  30 mL Oral Q4H PRN Ophelia Shoulder E, NP       haloperidol (HALDOL) tablet 5 mg  5 mg Oral TID PRN Chales Abrahams, NP       And    diphenhydrAMINE (BENADRYL) capsule 50 mg  50 mg Oral TID PRN Chales Abrahams, NP       DULoxetine (CYMBALTA) DR capsule 20 mg  20 mg Oral Daily Ophelia Shoulder E, NP   20 mg at 06/30/23 0809   lidocaine (LIDODERM) 5 % 1 patch  1 patch Transdermal Q24H Ophelia Shoulder E, NP       magnesium hydroxide (MILK OF MAGNESIA) suspension 30 mL  30 mL Oral Daily PRN Chales Abrahams, NP       PTA Medications: Medications Prior to Admission  Medication Sig Dispense Refill Last Dose/Taking   hydrOXYzine (ATARAX) 25 MG tablet Take 1 tablet (25 mg total) by mouth 3 (three) times daily as needed for anxiety. (Patient not taking: Reported on 06/29/2023) 30 tablet 0     Musculoskeletal: Strength & Muscle Tone: within normal limits Gait & Station: normal Patient leans: N/A  Psychiatric Specialty  Exam:  Presentation  General Appearance:  Slim build, limited grooming, no acute distress.  Appropriate behavior.  Eye Contact: Good.   Speech: Spontaneous, soft spoken.   Mood and Affect  Mood: Depressed.   Affect: Restricted and mood congruent.   Thought Process  Thought Processes: Goal directed.Descriptions of Associations: Intact.   Orientation:Full (Time, Place and Person)   Thought Content: Preoccupied with need for pain medication.  No negative rumination.  No guilty rumination.  No delusional theme.  No current suicidal thoughts.  No homicidal thoughts.  No thoughts of violence.  Hallucinations: No hallucinations in any modality.  Sensorium  Memory: Good.  Judgment: Good.   Insight: Good as patient is aware of his mental illness.  He wants to get help.  Executive Functions  Concentration: Good   Attention Span: Good.   Recall: Good.  Fund of Knowledge: Good.   Language: Good     Psychomotor Activity  Normal psychomotor activity   Physical Exam: Physical Exam Constitutional:      Appearance: Normal appearance.  HENT:     Head: Normocephalic and atraumatic.      Nose: Nose normal.     Mouth/Throat:     Mouth: Mucous membranes are moist.  Eyes:     Extraocular Movements: Extraocular movements intact.     Pupils: Pupils are equal, round, and reactive to light.  Cardiovascular:     Rate and Rhythm: Normal rate and regular rhythm.  Pulmonary:     Effort: Pulmonary effort is normal.     Breath sounds: Normal breath sounds.  Musculoskeletal:        General: Normal range of motion.     Cervical back: Normal range of motion and neck supple.  Skin:    General: Skin is warm.  Neurological:     Mental Status: He is alert and oriented to person, place, and time.    Review of Systems  Constitutional: Negative.   HENT: Negative.    Eyes: Negative.   Respiratory: Negative.    Cardiovascular: Negative.   Gastrointestinal: Negative.   Genitourinary: Negative.   Musculoskeletal: Negative.   Skin: Negative.   Neurological: Negative.   Endo/Heme/Allergies: Negative.   Psychiatric/Behavioral: Negative.     Blood pressure 131/80, pulse 75, temperature 98.3 F (36.8 C), temperature source Oral, resp. rate 18, height 5\' 6"  (1.676 m), weight 54 kg, SpO2 100%. Body mass index is 19.21 kg/m.  Treatment Plan Summary: Patient has an extensive history of alcohol use disorder.  He has been sober for about a decade.  He has not been on treatment for depression since he has been sober.  New onset of depressive symptoms related to chronic pain.  There is associated passive death wish but no intent to end his life.  Patient is very focused on pain management. We will initiate duloxetine to target depression.  We will use NSAIDs to target pain.  We will gather collateral, evaluate him further.  Observation Level/Precautions:  15 minute checks  Laboratory: No acute labs needed.  Psychotherapy:   Encourage unit groups and therapeutic activities.  Medications:  1.  Duloxetine 30 mg daily.  Will titrate with time. 2.  Naproxen 500 mg twice daily for pain.   Consultations: None indicated at this time.  Discharge Concerns: None.  Estimated LOS: 3 to 7 days.  Other:     Physician Treatment Plan for Primary Diagnosis: MDD (major depressive disorder), recurrent episode, severe (HCC) Long Term Goal(s): Improvement in symptoms so as ready for  discharge  Short Term Goals: Mood regulation and pain management.  Physician Treatment Plan for Secondary Diagnosis: Principal Problem:   MDD (major depressive disorder), recurrent episode, severe (HCC)  Long Term Goal(s): Improvement in symptoms so as ready for discharge  Short Term Goals: Mood regulation and pain management.  I certify that inpatient services furnished can reasonably be expected to improve the patient's condition.    Georgiann Cocker, MD 2/26/20251:17 PM

## 2023-06-30 NOTE — Group Note (Signed)
 Recreation Therapy Group Note   Group Topic:Stress Management  Group Date: 06/30/2023 Start Time: 1191 End Time: 0952 Facilitators: Dreama Kuna-McCall, LRT,CTRS Location: [address] Dayroom   Group Topic: Stress Management  Goal Area(s) Addresses:  Patient will identify positive stress management techniques. Patient will identify benefits of using stress management post d/c.  Behavioral Response:   Intervention: Insight Timer App  Activity: Meditation. LRT and patients went over meditation before listening to meditation presented in group. LRT played a meditation that focused on self-compassion and speaking positive over ones self throughout the course of the day. The meditation also focused on accepting yourself as you presently are while working towards growth. Patients were to listen and follow along as meditation played to get the most out of it.    Education:  Stress Management, Discharge Planning.   Education Outcome: Acknowledges Education   Affect/Mood: Appropriate   Participation Level: Moderate   Participation Quality: Independent   Behavior: Cooperative   Speech/Thought Process: Relevant   Insight: Fair   Judgement: Fair    Modes of Intervention: App   Patient Response to Interventions:  Receptive   Education Outcome:  In group clarification offered    Clinical Observations/Individualized Feedback: Pt wasn't focused or fully engaged in meditation. Pt was quiet and respectful as peers participated in meditation.     Plan: Continue to engage patient in RT group sessions 2-3x/week.   Quanda Pavlicek-McCall, LRT,CTRS 06/30/2023 12:36 PM

## 2023-06-30 NOTE — Tx Team (Signed)
 Initial Treatment Plan 06/30/2023 3:45 AM [name] QIH:[id number]    PATIENT STRESSORS: Financial difficulties   Substance abuse     PATIENT STRENGTHS: Manufacturing systems engineer  Supportive family/friends  Work skills    PATIENT IDENTIFIED PROBLEMS: Suicide ideation   Anxiety  "Attend all groups"                 DISCHARGE CRITERIA:  Ability to meet basic life and health needs Improved stabilization in mood, thinking, and/or behavior Medical problems require only outpatient monitoring Verbal commitment to aftercare and medication compliance  PRELIMINARY DISCHARGE PLAN: Attend aftercare/continuing care group Attend PHP/IOP Outpatient therapy Return to previous living arrangement Return to previous work or school arrangements  PATIENT/FAMILY INVOLVEMENT: This treatment plan has been presented to and reviewed with the patient, [name], and/or family member.  The patient and family have been given the opportunity to ask questions and make suggestions.  Bethann Punches, RN 06/30/2023, 3:45 AM

## 2023-06-30 NOTE — [facility] Group Notes (Signed)
 Spirituality group facilitated by Kathleen Argue, BCC.  Group Description: Group focused on topic of hope. Patients participated in facilitated discussion around topic, connecting with one another around experiences and definitions for hope. Group members engaged with visual explorer photos, reflecting on what hope looks like for them today. Group engaged in discussion around how their definitions of hope are present today in hospital.  Modalities: Psycho-social ed, Adlerian, Narrative, MI  Patient Progress: [name] attended group and actively engaged and participated in group conversation and activities.  Comments demonstrated good insight and contributed positively to the group conversation.

## 2023-07-01 DIAGNOSIS — F332 Major depressive disorder, recurrent severe without psychotic features: Secondary | ICD-10-CM | POA: Diagnosis not present

## 2023-07-01 MED ORDER — TRAZODONE HCL 50 MG PO TABS
50.0000 mg | ORAL_TABLET | ORAL | Status: AC
  Administered 2023-07-01: 50 mg via ORAL
  Filled 2023-07-01 (×2): qty 1

## 2023-07-01 MED ORDER — NICOTINE POLACRILEX 2 MG MT GUM
2.0000 mg | CHEWING_GUM | OROMUCOSAL | Status: DC | PRN
  Administered 2023-07-01 – 2023-07-02 (×2): 2 mg via ORAL
  Filled 2023-07-01: qty 1

## 2023-07-01 NOTE — B[facility] Counselor (Signed)
 Adult Comprehensive Assessment  Patient ID: [name], male   DOB: March 08, 1971, 53 y.o.   MRN: [id number]  Information Source: Information source: Patient  Current Stressors:  Patient states their primary concerns and needs for treatment are:: "I need pain meds" Patient states their goals for this hospitilization and ongoing recovery are:: "Needing to leave to get pain meds" Educational / Learning stressors: None reported Employment / Job issues: None reported Family Relationships: None reported, "I don't talk to many of my family membersEngineer, petroleum / Lack of resources (include bankruptcy): "Yes that is the worst between the pain and no money" Housing / Lack of housing: "I have a place to live but I am afraid my bills are not paid and my water will be off" Physical health (include injuries & life threatening diseases): "I have really bad back pain and need a procedure" Social relationships: None reported Substance abuse: None reported Bereavement / Loss: "I lost my daughter in 2020 and it feels like it was just yesterday"  Living/Environment/Situation:  Living Arrangements: Alone Living conditions (as described by patient or guardian): home Who else lives in the home?: Alone What is atmosphere in current home: Comfortable  Family History:  Marital status: Single Are you sexually active?: Yes What is your sexual orientation?: heterosexual Has your sexual activity been affected by drugs, alcohol, medication, or emotional stress?: n/a Does patient have children?: Yes How many children?: 4 How is patient's relationship with their children?: Pt reports that he has 2 living children, 2 deceased.  "awesome, my oldest is deceased and the relationship was beautiful, a lot of resentment at not being able to attend the service"  Childhood History:  By whom was/is the patient raised?: Both parents Additional childhood history information: "I had a great childhood" Description of  patient's relationship with caregiver when they were a child: "Spoiled" Patient's description of current relationship with people who raised him/her: "I don't have much of a relationship with my family" How were you disciplined when you got in trouble as a child/adolescent?: "I got my ass whooped and on punishment" Does patient have siblings?: Yes Number of Siblings: 2 Description of patient's current relationship with siblings: Sister passed, no relationship with other sister Did patient suffer any verbal/emotional/physical/sexual abuse as a child?: No Did patient suffer from severe childhood neglect?: No Has patient ever been sexually abused/assaulted/raped as an adolescent or adult?: No Was the patient ever a victim of a crime or a disaster?: No Witnessed domestic violence?: Yes Has patient been affected by domestic violence as an adult?: No Description of domestic violence: "I saw things in [place]"  Education:  Highest grade of school patient has completed: 10th Currently a student?: No Learning disability?: No  Employment/Work Situation:   Employment Situation: Unemployed ("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?: No  Financial Resources:   Financial resources: Income from employment ("I have no real income just sometimes") Does patient have a representative payee or guardian?: No  Alcohol/Substance Abuse:   What has been your use of drugs/alcohol within the last 12 months?: "I smoke a lot of weed" If attempted suicide, did drugs/alcohol play a role in this?: No Alcohol/Substance Abuse Treatment Hx: Past Tx, Inpatient If yes, describe treatment: "I have been to a  lot of rehabs, I have not had a sip of alcohol in 10 years. [facility] changed my life" Has  alcohol/substance abuse ever caused legal problems?: No  Social Support System:   Forensic psychologist System: Poor Describe Community Support System: "I stay alive because of my daughter" Type of faith/religion: Christianity How does patient's faith help to cope with current illness?: Read the bible, pray  Leisure/Recreation:   Do You Have Hobbies?: Yes Leisure and Hobbies: Cooking, reading  Strengths/Needs:   What is the patient's perception of their strengths?: "I am a fabulous cook" Patient states they can use these personal strengths during their treatment to contribute to their recovery: None reported Patient states these barriers may affect/interfere with their treatment: None reported Patient states these barriers may affect their return to the community: None reported Other important information patient would like considered in planning for their treatment: None reported  Discharge Plan:   Currently receiving community mental health services: No Patient states concerns and preferences for aftercare planning are: [facility] currently, would like MH providers Patient states they will know when they are safe and ready for discharge when: "I need more time" Does patient have access to transportation?: No Does patient have financial barriers related to discharge medications?: Yes Patient description of barriers related to discharge medications: No income Plan for no access to transportation at discharge: CSW to arrange Will patient be returning to same living situation after discharge?: Yes  Summary/Recommendations:   Summary and Recommendations (to be completed by the evaluator): [name] is a 53 year old male who is voluntarily admitted to [facility] secondary to [facility] due to worsening depression in the context of chronic pain.  Patient seeking pain management but expressed thoughts of suicide which led to psychiatric admission. Pt reports being "tired, just simply tired" of  living during today's assessment. Pt lives alone, is unemployed, and experiencing back pain that is "unmanageable." Pt reports the only reason he does not commit suicide is because of his daughter and his fear of going to hell if he does. Pt endorses marijuana use but denies other drugs and alcohol, reporting he has been sober from alcohol for 10 years. Pt requesting information for disability lawyers, food banks, and resources to help pay for his bills. Pt reports having an up coming court case in March regarding his child support that he still owes for his 76 year old daughter. Pt goes to [facility] and would like to go there for mental health providers as well. Pt endorses thoughts of SI in the context of discharge and "what is waiting for when I get home." Denies HI and AVH. Pt stated "if I were to leave today there's no telling if I would make it back here again in the future. I don't think I could handle it." MD made aware of statements. While here, [name] can benefit from crisis stabilization, medication management, therapeutic milieu, and referrals for services.   Kathi Der. 07/01/2023

## 2023-07-01 NOTE — Plan of Care (Signed)
  Problem: Education: Goal: Knowledge of [place] [facility] Education information/materials will improve Outcome: Completed/Met Goal: Emotional status will improve Outcome: Progressing Goal: Mental status will improve Outcome: Progressing Goal: Verbalization of understanding the information provided will improve Outcome: Progressing   Problem: Activity: Goal: Interest or engagement in activities will improve Outcome: Progressing Goal: Sleeping patterns will improve Outcome: Progressing

## 2023-07-01 NOTE — Progress Notes (Signed)
   07/01/23 0800  Psych Admission Type (Psych Patients Only)  Admission Status Voluntary  Psychosocial Assessment  Patient Complaints Depression (back pain, refusing interventions.)  Eye Contact Fair  Facial Expression Animated  Affect Anxious  Speech Logical/coherent  Interaction Assertive  Motor Activity Restless  Appearance/Hygiene Unremarkable  Behavior Characteristics Cooperative  Mood Pleasant  Thought Process  Coherency Circumstantial  Content Preoccupation  Delusions None reported or observed  Perception WDL  Hallucination None reported or observed  Judgment Limited  Confusion None  Danger to Self  Current suicidal ideation? Denies  Agreement Not to Harm Self Yes  Description of Agreement Verbal  Danger to Others  Danger to Others None reported or observed

## 2023-07-01 NOTE — [facility] Group Notes (Signed)
 [facility] Group Notes:  (Nursing/MHT/Case Management/Adjunct)  Date:  07/01/2023  Time:  9:50 PM  Type of Therapy:  Psychoeducational Skills  Participation Level:  Minimal  Participation Quality:  Resistant  Affect:  Depressed and Flat  Cognitive:  Lacking  Insight:  None  Engagement in Group:  Limited  Modes of Intervention:  Education  Summary of Progress/Problems: Patient stated that he had a bad day but was polite about declining to answer.   Hazle Coca S 07/01/2023, 9:50 PM

## 2023-07-01 NOTE — Progress Notes (Signed)
   06/30/23 2200  Psych Admission Type (Psych Patients Only)  Admission Status Voluntary  Psychosocial Assessment  Patient Complaints Depression (Depression 8/10)  Eye Contact Fair  Facial Expression Animated  Affect Anxious  Speech Logical/coherent  Interaction Assertive  Motor Activity Restless  Appearance/Hygiene Unremarkable  Behavior Characteristics Cooperative  Mood Pleasant  Thought Process  Coherency Circumstantial  Content Preoccupation  Delusions None reported or observed  Perception WDL  Hallucination None reported or observed  Judgment Impaired  Confusion None  Danger to Self  Current suicidal ideation? Denies  Agreement Not to Harm Self Yes  Description of Agreement Verbal  Danger to Others  Danger to Others None reported or observed

## 2023-07-01 NOTE — Group Note (Signed)
 LCSW Group Therapy Note  Group Date: 07/01/2023 Start Time: 1100 End Time: 1200  Participation:  patient was present and actively participated in the conversation.  Type of Therapy:  Group Therapy  Topic:  "Speaking from the Heart: Communicating with Understanding and Empathy"  Objective: To help participants develop effective communication skills to express themselves clearly, listen actively, and navigate conflicts in a healthy way.  Goals: Increase awareness of verbal and non-verbal communication skills. Practice using "I" statements and active listening techniques. Learn coping strategies for managing communication stress.  Summary: Participants explored the importance of communication, discussed challenges, and practiced skills such as active listening and assertive expression. They reflected on past experiences and identified ways to improve communication in their daily lives.  Therapeutic Modalities: Cognitive-Behavioral Therapy (CBT): Restructuring negative thought patterns in communication. Mindfulness: Staying present and calm during conversations. Psychoeducation: Learning about effective communication techniques.   [name], LCSWA 07/01/2023  1:08 PM

## 2023-07-01 NOTE — Progress Notes (Signed)
 [facility] MD Progress Note  07/01/2023 2:53 PM [name]  MRN:  [id number] Subjective:   53 year old African-American male, single, unemployed, lives alone.  Background history of alcohol use disorder in full remission, chronic pain and MDD recurrent.  Self-presented to behavioral health urgent care on account of worsening depression in context of chronic pain.  Patient seeking pain management but expressed thoughts of suicide which led to psychiatric admission. Routine labs significant for mild hypokalemia.  UDS positive for THC.  No alcohol detected.  Chart reviewed today.  Patient discussed at multidisciplinary team meeting.  Nursing staff reports that patient remains preoccupied with pain management.  He is not in acute distress.  No observed discomfort when changing position.  He is not interested in Tylenol.  Social worker reports that the patient expressed feeling of hopelessness based on his psychosocial situation.  Seen today.  Patient continues to perseverate about pain treatment.  States that there is a lot going on in his life.  States that his cable is off at home.  His father will be caught up soon as he is packing payment.  States that he might be incarcerated for not paying child support.  Patient states that there is a hold on his food stamp.  He does not have any other resources in the community.  States that he may not be able to support himself if he goes home.  Reports hopelessness and worthlessness.  States that his daughter is the main  reason suicide is not an option.  Patient reports slight nausea and dizziness from his SNRI.  No vomiting.  He really does not care about antidepressant medication.  States that he is more focused on his pain and psychosocial stressors.  No manic features.  No psychotic features. Encouraged to use Tylenol as needed.   Principal Problem: MDD (major depressive disorder), recurrent episode, severe (HCC) Diagnosis: Principal Problem:   MDD  (major depressive disorder), recurrent episode, severe (HCC)  Total Time spent with patient: 30 minutes  Past Psychiatric History:  See H&P  Past Medical History:  Past Medical History:  Diagnosis Date   Alcohol abuse    Anemia    Colitis    Severe major depression with psychotic features (HCC)    History reviewed. No pertinent surgical history. Family History:  Family History  Problem Relation Age of Onset   Hypertension Mother    Hypertension Father    Hypertension Other    Family Psychiatric  History:  See H&P  Social History:  Social History   Substance and Sexual Activity  Alcohol Use No   Comment: former  last use 5+ year ago     Social History   Substance and Sexual Activity  Drug Use Yes   Frequency: 5.0 times per week   Types: Marijuana   Comment: 10 years 11/03    Social History   Socioeconomic History   Marital status: Single    Spouse name: Not on file   Number of children: Not on file   Years of education: Not on file   Highest education level: Not on file  Occupational History   Not on file  Tobacco Use   Smoking status: Every Day    Types: Cigarettes   Smokeless tobacco: Never  Vaping Use   Vaping status: Never Used  Substance and Sexual Activity   Alcohol use: No    Comment: former  last use 5+ year ago   Drug use: Yes    Frequency: 5.0 times  per week    Types: Marijuana    Comment: 10 years 11/03   Sexual activity: Yes    Birth control/protection: Condom  Other Topics Concern   Not on file  Social History Narrative   Not on file   Social Drivers of Health   Financial Resource Strain: High Risk (03/19/2019)   Overall Financial Resource Strain (CARDIA)    Difficulty of Paying Living Expenses: Very hard  Food Insecurity: Food Insecurity Present (03/23/2023)   Hunger Vital Sign    Worried About Programme researcher, broadcasting/film/video in the Last Year: Often true    Ran Out of Food in the Last Year: Often true  Transportation Needs: Unmet  Transportation Needs (03/23/2023)   PRAPARE - Administrator, Civil Service (Medical): Yes    Lack of Transportation (Non-Medical): Yes  Physical Activity: Not on file  Stress: Not on file  Social Connections: Patient Declined (06/30/2023)   Social Connection and Isolation Panel [NHANES]    Frequency of Communication with Friends and Family: Patient declined    Frequency of Social Gatherings with Friends and Family: Patient declined    Attends Religious Services: Patient declined    Database administrator or Organizations: Patient declined    Attends Engineer, structural: Patient declined    Marital Status: Patient declined    Current Medications: Current Facility-Administered Medications  Medication Dose Route Frequency Provider Last Rate Last Admin   acetaminophen (TYLENOL) tablet 650 mg  650 mg Oral Q6H PRN Dorean Daniello, Delight Ovens, MD       alum & mag hydroxide-simeth (MAALOX/MYLANTA) 200-200-20 MG/5ML suspension 30 mL  30 mL Oral Q4H PRN Ophelia Shoulder E, NP       haloperidol (HALDOL) tablet 5 mg  5 mg Oral TID PRN Chales Abrahams, NP       And   diphenhydrAMINE (BENADRYL) capsule 50 mg  50 mg Oral TID PRN Chales Abrahams, NP       DULoxetine (CYMBALTA) DR capsule 30 mg  30 mg Oral Daily Sahirah Rudell A, MD       lidocaine (LIDODERM) 5 % 1 patch  1 patch Transdermal Q24H Ophelia Shoulder E, NP       magnesium hydroxide (MILK OF MAGNESIA) suspension 30 mL  30 mL Oral Daily PRN Chales Abrahams, NP        Lab Results: No results found for this or any previous visit (from the past 48 hours).  Blood Alcohol level:  Lab Results  Component Value Date   ETH <10 06/29/2023   ETH <10 03/22/2023    Metabolic Disorder Labs: Lab Results  Component Value Date   HGBA1C 5.2 08/27/2014   MPG 103 08/27/2014   MPG 105 03/08/2013   No results found for: "PROLACTIN" No results found for: "CHOL", "TRIG", "HDL", "CHOLHDL", "VLDL", "LDLCALC"  Physical Findings: AIMS:  , ,   ,  ,    CIWA:    COWS:     Musculoskeletal: Strength & Muscle Tone: within normal limits Gait & Station: normal Patient leans: N/A  Psychiatric Specialty Exam:  Presentation  General Appearance:  Slim build, casually dressed, not in acute distress, no discomfort shifting from standing to sitting position.  No EPS.   Eye Contact: Good.   Speech: Spontaneous, soft spoken.   Mood and Affect  Mood: Depressed.   Affect: Restricted and mood congruent.   Thought Process  Thought Processes: Goal directed.Descriptions of Associations: Intact.   Orientation:Full (Time, Place  and Person)   Thought Content: Preoccupied with need for pain medication.  No negative rumination.  No guilty rumination.  No delusional theme.  No current suicidal thoughts.  No homicidal thoughts.  No thoughts of violence.   Hallucinations: No hallucinations in any modality.   Sensorium  Memory: Good.   Judgment: Good.   Insight: Good as patient is aware of his mental illness.  He wants to get help.   Executive Functions  Concentration: Good   Attention Span: Good.   Recall: Good.   Fund of Knowledge: Good.   Language: Good     Psychomotor Activity  Normal psychomotor activity   Physical Exam: Physical Exam ROS Blood pressure 134/76, pulse 66, temperature 98.3 F (36.8 C), temperature source Oral, resp. rate 18, height 5\' 6"  (1.676 m), weight 54 kg, SpO2 100%. Body mass index is 19.21 kg/m.   Treatment Plan Summary: Patient has mild GI symptoms from recent increasing dose of duloxetine.  He is tolerating it well otherwise.  He is overwhelmed by his psychosocial stressors.  He is focusing more on pain management.  No imminent dangerousness on the unit.  We will maintain his current medication with the goal of optimizing when he is tolerating it better.  1.  Continue duloxetine 30 mg daily. 2.  Continue to monitor mood behavior and interaction with others. 3.  Continue to  encourage unit groups and therapeutic activities. 4.  Social worker will coordinate discharge and aftercare planning. 5.  Social worker will provide any locally available resources for him.   Georgiann Cocker, MD 07/01/2023, 2:53 PM

## 2023-07-01 NOTE — Progress Notes (Signed)
 Writer discussed non-pharmacological ways to help with pain issues, writer appeared to be receptive, discussion on sleep medications, pt stated he would take a Trazodone, so NP Channing Mutters)  ordered and was given to pt per [facility]     07/01/23 2315  Psych Admission Type (Psych Patients Only)  Admission Status Voluntary  Psychosocial Assessment  Patient Complaints Depression  Eye Contact Fair  Facial Expression Sad  Affect Anxious  Speech Logical/coherent  Interaction Assertive  Motor Activity Restless  Appearance/Hygiene Unremarkable  Behavior Characteristics Cooperative  Mood Preoccupied;Pleasant  Aggressive Behavior  Effect No apparent injury  Thought Process  Coherency Circumstantial  Content Preoccupation  Delusions None reported or observed  Perception WDL  Hallucination None reported or observed  Judgment Limited  Confusion None  Danger to Self  Current suicidal ideation? Denies  Danger to Others  Danger to Others None reported or observed

## 2023-07-01 NOTE — BHH Suicide Risk Assessment (Signed)
 [facility] INPATIENT:  Family/Significant Other Suicide Prevention Education  Suicide Prevention Education:  Patient Refusal for Family/Significant Other Suicide Prevention Education: The patient [name] has refused to provide written consent for family/significant other to be provided Family/Significant Other Suicide Prevention Education during admission and/or prior to discharge.  Physician notified.  Kathi Der 07/01/2023, 10:47 AM

## 2023-07-01 NOTE — Plan of Care (Signed)
  Problem: Education: Goal: Mental status will improve Outcome: Progressing Goal: Verbalization of understanding the information provided will improve Outcome: Progressing   Problem: Activity: Goal: Interest or engagement in activities will improve Outcome: Progressing Goal: Sleeping patterns will improve Outcome: Progressing   Problem: Coping: Goal: Ability to verbalize frustrations and anger appropriately will improve Outcome: Progressing   Problem: Safety: Goal: Periods of time without injury will increase Outcome: Progressing

## 2023-07-02 DIAGNOSIS — F332 Major depressive disorder, recurrent severe without psychotic features: Secondary | ICD-10-CM

## 2023-07-02 MED ORDER — NICOTINE 14 MG/24HR TD PT24
14.0000 mg | MEDICATED_PATCH | Freq: Every day | TRANSDERMAL | Status: DC
  Administered 2023-07-03 – 2023-07-04 (×2): 14 mg via TRANSDERMAL
  Filled 2023-07-02 (×6): qty 1

## 2023-07-02 MED ORDER — TRAZODONE HCL 50 MG PO TABS
50.0000 mg | ORAL_TABLET | Freq: Once | ORAL | Status: AC | PRN
  Administered 2023-07-02: 50 mg via ORAL
  Filled 2023-07-02: qty 1

## 2023-07-02 NOTE — Plan of Care (Signed)

## 2023-07-02 NOTE — Plan of Care (Signed)

## 2023-07-02 NOTE — Group Note (Signed)
 Recreation Therapy Group Note   Group Topic:Problem Solving  Group Date: 07/02/2023 Start Time: 0930 End Time: 0950 Facilitators: Kingston Shawgo-McCall, LRT,CTRS Location: [address] Day[address]   Group Topic: Communication, Team Building, Problem Solving  Goal Area(s) Addresses:  Patient will effectively work with peer towards shared goal.  Patient will identify skill used to make activity successful.  Patient will identify how skills used during activity can be used to reach post d/c goals.  Patient will identify a daily goal. Patient will cooperate in goals group conversation.   Intervention: Cup [employer] bands with attached strings enough for each group member, 10 or more cups  Activity: Patient(s) were given a set of solo cups, a rubber band, and some tied strings. The objective is to build a pyramid with the cups by only using the rubber band and string to move the cups. After the activity the patient(s) are LRT debriefed and discussed what strategies worked, what didn't, and what lessons they can take from the activity and use in life post discharge.   Education Areas: Pharmacist, community, Counsellor, Discharge Planning   Education Outcome: Acknowledges education   Affect/Mood: Appropriate   Participation Level: Engaged   Participation Quality: Independent   Behavior: Appropriate   Speech/Thought Process: Focused   Insight: Good   Judgement: Good   Modes of Intervention: Problem-solving   Patient Response to Interventions:  Engaged   Education Outcome:  In group clarification offered    Clinical Observations/Individualized Feedback: Pt was a little late to group but joined right in with the activity. Pt shared ideas with peers and worked with them to complete the activity. Pt was bright and cooperative during group.    Plan: Continue to engage patient in RT group sessions 2-3x/week.   Ancelmo Hunt-McCall, LRT,CTRS  07/02/2023 12:14 PM

## 2023-07-02 NOTE — Progress Notes (Signed)
  [name]   Type of Note: Resources  Per pt request, numerous resources have been provided (organization, address, and phone numbers) for the following ;   - Free hot meals served in [place] - [employer] in the [place] - Rental emergency assistance programs - Programmer, multimedia - [employer] in [place]  Signed:  Kinzey Sheriff, LCSW-A 07/02/2023  3:03 PM
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
## 2023-07-02 NOTE — Progress Notes (Signed)
   07/02/23 0900  Psych Admission Type (Psych Patients Only)  Admission Status Voluntary  Psychosocial Assessment  Patient Complaints Depression  Eye Contact Fair  Facial Expression Sad  Affect Anxious  Speech Logical/coherent  Interaction Assertive  Motor Activity Restless  Appearance/Hygiene Unremarkable  Behavior Characteristics Cooperative;Appropriate to situation  Mood Pleasant  Thought Process  Coherency Circumstantial  Content Preoccupation  Delusions None reported or observed  Perception WDL  Hallucination None reported or observed  Judgment Poor  Confusion None  Danger to Self  Current suicidal ideation? Denies  Agreement Not to Harm Self Yes  Description of Agreement verbal  Danger to Others  Danger to Others None reported or observed

## 2023-07-03 DIAGNOSIS — F332 Major depressive disorder, recurrent severe without psychotic features: Secondary | ICD-10-CM | POA: Diagnosis not present

## 2023-07-03 MED ORDER — TRAZODONE HCL 50 MG PO TABS
50.0000 mg | ORAL_TABLET | Freq: Every evening | ORAL | Status: AC | PRN
  Administered 2023-07-03: 50 mg via ORAL
  Filled 2023-07-03: qty 1

## 2023-07-03 NOTE — Progress Notes (Signed)
 [facility] MD Progress Note  07/03/2023 2:09 PM [name]  MRN:  [id number] Subjective:   53 year old African-American male, single, unemployed, lives alone.  Background history of alcohol use disorder in full remission, chronic pain and MDD recurrent.  Self-presented to behavioral health urgent care on account of worsening depression in context of chronic pain.  Patient seeking pain management but expressed thoughts of suicide which led to psychiatric admission. Routine labs significant for mild hypokalemia.  UDS positive for THC.  No alcohol detected.  Chart reviewed today.  Patient discussed at multidisciplinary team meeting.  Staff reports that patient slept well last night.  He slept for 6.5 hours.  He required as needed trazodone since his roommate was snoring.  He has been participating with unit groups and therapeutic activities.  He is grooming himself.  No observed distress.  Seen today.  Patient states that his roommate was snoring yesterday as he required trazodone.  States that typically he has no issues getting into sleep.  He slept well after having the trazodone.  He feels well refreshed this morning.  States that he has been in good spirits.  He looks forward to discharge tomorrow.  No negative ruminative flooding lately.  Patient plans to explore practical ways of dealing with his psychosocial problems.  No suicidal ideation.  Having children remains a protective factor for him.  No homicidal thoughts.  No thoughts of violence.  There are no manic symptoms.  There are no psychotic symptoms.  No side effects from his medications. Encouraged.  Principal Problem: MDD (major depressive disorder), recurrent episode, severe (HCC) Diagnosis: Principal Problem:   MDD (major depressive disorder), recurrent episode, severe (HCC)  Total Time spent with patient: 30 minutes  Past Psychiatric History:  See H&P  Past Medical History:  Past Medical History:  Diagnosis Date   Alcohol  abuse    Anemia    Colitis    Severe major depression with psychotic features (HCC)    History reviewed. No pertinent surgical history. Family History:  Family History  Problem Relation Age of Onset   Hypertension Mother    Hypertension Father    Hypertension Other    Family Psychiatric  History:  See H&P  Social History:  Social History   Substance and Sexual Activity  Alcohol Use No   Comment: former  last use 5+ year ago     Social History   Substance and Sexual Activity  Drug Use Yes   Frequency: 5.0 times per week   Types: Marijuana   Comment: 10 years 11/03    Social History   Socioeconomic History   Marital status: Single    Spouse name: Not on file   Number of children: Not on file   Years of education: Not on file   Highest education level: Not on file  Occupational History   Not on file  Tobacco Use   Smoking status: Every Day    Types: Cigarettes   Smokeless tobacco: Never  Vaping Use   Vaping status: Never Used  Substance and Sexual Activity   Alcohol use: No    Comment: former  last use 5+ year ago   Drug use: Yes    Frequency: 5.0 times per week    Types: Marijuana    Comment: 10 years 11/03   Sexual activity: Yes    Birth control/protection: Condom  Other Topics Concern   Not on file  Social History Narrative   Not on file   Social Drivers of  Health   Financial Resource Strain: High Risk (03/19/2019)   Overall Financial Resource Strain (CARDIA)    Difficulty of Paying Living Expenses: Very hard  Food Insecurity: Food Insecurity Present (03/23/2023)   Hunger Vital Sign    Worried About Running Out of Food in the Last Year: Often true    Ran Out of Food in the Last Year: Often true  Transportation Needs: Unmet Transportation Needs (03/23/2023)   PRAPARE - Administrator, Civil Service (Medical): Yes    Lack of Transportation (Non-Medical): Yes  Physical Activity: Not on file  Stress: Not on file  Social Connections:  Patient Declined (06/30/2023)   Social Connection and Isolation Panel [NHANES]    Frequency of Communication with Friends and Family: Patient declined    Frequency of Social Gatherings with Friends and Family: Patient declined    Attends Religious Services: Patient declined    Database administrator or Organizations: Patient declined    Attends Engineer, structural: Patient declined    Marital Status: Patient declined    Current Medications: Current Facility-Administered Medications  Medication Dose Route Frequency Provider Last Rate Last Admin   acetaminophen (TYLENOL) tablet 650 mg  650 mg Oral Q6H PRN Jaymie Mckiddy, Delight Ovens, MD       alum & mag hydroxide-simeth (MAALOX/MYLANTA) 200-200-20 MG/5ML suspension 30 mL  30 mL Oral Q4H PRN Ophelia Shoulder E, NP       haloperidol (HALDOL) tablet 5 mg  5 mg Oral TID PRN Chales Abrahams, NP       And   diphenhydrAMINE (BENADRYL) capsule 50 mg  50 mg Oral TID PRN Chales Abrahams, NP       DULoxetine (CYMBALTA) DR capsule 30 mg  30 mg Oral Daily Carrson Lightcap A, MD   30 mg at 07/03/23 0802   lidocaine (LIDODERM) 5 % 1 patch  1 patch Transdermal Q24H Ophelia Shoulder E, NP       magnesium hydroxide (MILK OF MAGNESIA) suspension 30 mL  30 mL Oral Daily PRN Ophelia Shoulder E, NP       nicotine (NICODERM CQ - dosed in mg/24 hours) patch 14 mg  14 mg Transdermal Daily Felicita Nuncio, Delight Ovens, MD   14 mg at 07/03/23 5409   nicotine polacrilex (NICORETTE) gum 2 mg  2 mg Oral PRN Sindy Guadeloupe, NP   2 mg at 07/02/23 2119    Lab Results: No results found for this or any previous visit (from the past 48 hours).  Blood Alcohol level:  Lab Results  Component Value Date   ETH <10 06/29/2023   ETH <10 03/22/2023    Metabolic Disorder Labs: Lab Results  Component Value Date   HGBA1C 5.2 08/27/2014   MPG 103 08/27/2014   MPG 105 03/08/2013   No results found for: "PROLACTIN" No results found for: "CHOL", "TRIG", "HDL", "CHOLHDL", "VLDL",  "LDLCALC"  Physical Findings: AIMS:  , ,  ,  ,    CIWA:    COWS:     Musculoskeletal: Strength & Muscle Tone: within normal limits Gait & Station: normal Patient leans: N/A  Psychiatric Specialty Exam:  Presentation  General Appearance:  Slim build, well-groomed, engage warmly.  No EPS.   Eye Contact: Good.   Speech: Spontaneous, normal rate, tone and volume.  Mood and Affect  Mood: Euthymic.  Affect: Full range and mood congruent.  Thought Process  Thought Processes: Goal directed.  Descriptions of Associations: Intact.   Orientation:Full (Time, Place and  Person)   Thought Content: Future-oriented.  No negative rumination.  No negative rumination.  No guilty rumination.  No delusional theme.  No current suicidal thoughts.  No homicidal thoughts.  No thoughts of violence.   Hallucinations: No hallucinations in any modality.   Sensorium  Memory: Good.   Judgment: Good.   Insight: Good as patient is aware of his mental illness.  He wants to get help.   Executive Functions  Concentration: Good   Attention Span: Good.   Recall: Good.   Fund of Knowledge: Good.   Language: Good     Psychomotor Activity  Normal psychomotor activity   Physical Exam: Physical Exam ROS Blood pressure 109/73, pulse 67, temperature 98.5 F (36.9 C), temperature source Oral, resp. rate 16, height 5\' 6"  (1.676 m), weight 54 kg, SpO2 100%. Body mass index is 19.21 kg/m.   Treatment Plan Summary: Patient has maintained progress.  His mood is stable.  There is no dangerousness.  There are no manic symptoms.  There are no psychotic symptoms.  We are finalizing aftercare.  Hopeful discharge tomorrow if he maintains stability.  1.  Continue duloxetine 30 mg daily. 2.  Continue to monitor mood behavior and interaction with others. 3.  Continue to encourage unit groups and therapeutic activities. 4.  Social worker will coordinate discharge and aftercare  planning. 5.  Social worker will provide any locally available resources for him.   Georgiann Cocker, MD 07/03/2023, 2:09 PM

## 2023-07-03 NOTE — Group Note (Signed)
 LCSW Group Therapy Note   Group Date: 07/03/2023 Start Time: 0100 End Time: 0145   Type of Therapy and Topic:  Group Therapy:   Participation Level:  Active  Description of Group This process group involved patients discussing the situations or people in their lives that frequently make them safe or unsafe.  Anxiety was a common factor among all group participants and many of them described home situations that keep them on edge and not able to feel completely safe.  Three questions were addressed during the group:  (1) What makes you feel safe (or unsafe)?  (2) Do you feel safe with yourself and why?  (3) If you don't feel safe, what can you do?  A lengthy discussion ensued in which group members empathized with each other, gave suggestions to one another, and expressed their feelings freely.  Therapeutic Goals Patient will describe what makes them feel safe or unsafe in their everyday lives. Patient will think about and discuss whether they feel safe with themselves and what reasons might contribute to feeling safe or unsafe. Patients will participate in planning for what can be done to help themselves feel safer.   Summary of Patient Progress:  Patient share that he is safe when he is alone. Patient share that he does not understand why people can not communicate and be truthful. Patient share that he understand safe coping and unsafe coping skills.     Therapeutic Modalities:   CBT     [name], LCSWA 07/03/2023  3:45 PM

## 2023-07-03 NOTE — Plan of Care (Signed)
   Problem: Coping: Goal: Ability to verbalize frustrations and anger appropriately will improve Outcome: Progressing   Problem: Coping: Goal: Ability to demonstrate self-control will improve Outcome: Progressing   Problem: Safety: Goal: Periods of time without injury will increase Outcome: Progressing

## 2023-07-03 NOTE — Group Note (Signed)
 Date:  07/03/2023 Time:  4:40 PM  Group Topic/Focus:  Goals Group:   The focus of this group is to help patients establish daily goals to achieve during treatment and discuss how the patient can incorporate goal setting into their daily lives to aide in recovery. Orientation:   The focus of this group is to educate the patient on the purpose and policies of crisis stabilization and provide a format to answer questions about their admission.  The group details unit policies and expectations of patients while admitted.    Participation Level:  Active  Participation Quality:  Appropriate  Affect:  Appropriate  Cognitive:  Appropriate  Insight: Appropriate  Engagement in Group:  Engaged  Modes of Intervention:  Discussion and Orientation  Additional Comments:   Pt was cooperative and actively participated in the group. Pt personal goal is to exercise his faith more and to remain humble.  [name] 07/03/2023, 4:40 PM

## 2023-07-03 NOTE — Progress Notes (Signed)
   07/02/23 2200  Psych Admission Type (Psych Patients Only)  Admission Status Voluntary  Psychosocial Assessment  Patient Complaints Depression  Eye Contact Fair  Facial Expression Sad  Affect Anxious  Speech Logical/coherent  Interaction Assertive  Motor Activity Restless  Appearance/Hygiene Unremarkable  Behavior Characteristics Cooperative;Appropriate to situation  Mood Pleasant  Thought Process  Coherency Circumstantial  Content Preoccupation  Delusions None reported or observed  Perception WDL  Hallucination None reported or observed  Judgment Poor  Confusion None  Danger to Self  Current suicidal ideation? Denies  Agreement Not to Harm Self Yes  Description of Agreement Verbal  Danger to Others  Danger to Others None reported or observed

## 2023-07-03 NOTE — Plan of Care (Signed)
  Problem: Education: Goal: Mental status will improve Outcome: Progressing Goal: Verbalization of understanding the information provided will improve Outcome: Progressing   Problem: Activity: Goal: Interest or engagement in activities will improve Outcome: Progressing Goal: Sleeping patterns will improve Outcome: Progressing   Problem: Safety: Goal: Periods of time without injury will increase Outcome: Progressing

## 2023-07-03 NOTE — [facility] Group Notes (Signed)
 [facility] Group Notes:  (Nursing/MHT/Case Management/Adjunct)  Date:  07/03/2023  Time:  2000  Type of Therapy:   Wrap up group  Participation Level:  Active  Participation Quality:  Appropriate, Attentive, Sharing, and Supportive  Affect:  Appropriate  Cognitive:  Alert  Insight:  Improving  Engagement in Group:  Distracting  Modes of Intervention:  Clarification, Education, and Socialization  Summary of Progress/Problems: Positive thinking and self-care were discussed.   Johann Capers S 07/03/2023, 9:22 PM

## 2023-07-03 NOTE — Progress Notes (Signed)
   07/03/23 1000  Psych Admission Type (Psych Patients Only)  Admission Status Voluntary  Psychosocial Assessment  Patient Complaints Depression  Eye Contact Fair  Facial Expression Flat;Sad  Affect Anxious  Speech Logical/coherent  Interaction Assertive  Motor Activity Restless  Appearance/Hygiene Unremarkable  Behavior Characteristics Cooperative;Calm  Mood Pleasant  Thought Process  Coherency Circumstantial  Content Preoccupation  Delusions None reported or observed  Perception WDL  Hallucination None reported or observed  Judgment Poor  Confusion None  Danger to Self  Current suicidal ideation? Denies  Agreement Not to Harm Self Yes  Description of Agreement verbal  Danger to Others  Danger to Others None reported or observed

## 2023-07-04 DIAGNOSIS — F332 Major depressive disorder, recurrent severe without psychotic features: Secondary | ICD-10-CM | POA: Diagnosis not present

## 2023-07-04 MED ORDER — NICOTINE 14 MG/24HR TD PT24: 14.0000 mg | MEDICATED_PATCH | Freq: Every day | TRANSDERMAL | 0 refills | Status: DC

## 2023-07-04 MED ORDER — LIDOCAINE 5 % EX PTCH: 1.0000 | MEDICATED_PATCH | CUTANEOUS | 0 refills | Status: AC

## 2023-07-04 MED ORDER — DULOXETINE HCL 30 MG PO CPEP: 30.0000 mg | ORAL_CAPSULE | Freq: Every day | ORAL | 0 refills | Status: DC

## 2023-07-04 NOTE — Progress Notes (Signed)
 Pt awake and alert at time of discharge.   Oriented x 4.   Denies SI, HI or AVH.  Pt given AVS along with Discharge paperwork.  Verbalized understanding on where to obtain medications from and follow up care.  Pt given back all belongings from locker 49 along with Red Suit case in storage area.  Signed belonging sheet.  Pt was escorted to lobby and left with taxi via voucher.  No distress noted.

## 2023-07-04 NOTE — [facility] Suicide Risk Assessment (Signed)
 [facility] INPATIENT:  Family/Significant Other Suicide Prevention Education  Suicide Prevention Education:  Education Completed; [name],  (Patient) has been identified by the patient as the family member/significant other with whom the patient will be residing, and identified as the person(s) who will aid the patient in the event of a mental health crisis (suicidal ideations/suicide attempt).  With written consent from the patient, the family member/significant other has been provided the following suicide prevention education, prior to the and/or following the discharge of the patient.  The suicide prevention education provided includes the following: Suicide risk factors Suicide prevention and interventions National Suicide Hotline telephone number [facility] assessment telephone number [facility] Emergency Assistance 911 [facility] and/or Residential Mobile Crisis Unit telephone number  Request made of family/significant other to: Remove weapons (e.g., guns, rifles, knives), all items previously/currently identified as safety concern.   Remove drugs/medications (over-the-counter, prescriptions, illicit drugs), all items previously/currently identified as a safety concern.  The family member/significant other verbalizes understanding of the suicide prevention education information provided.  The family member/significant other agrees to remove the items of safety concern listed above.  Patient stated that he understand SPE   [name] 07/04/2023, 9:07 AM

## 2023-07-04 NOTE — [facility] Suicide Risk Assessment (Signed)
 [facility] Discharge Suicide Risk Assessment   Principal Problem: MDD (major depressive disorder), recurrent episode, severe (HCC) Discharge Diagnoses: Principal Problem:   MDD (major depressive disorder), recurrent episode, severe (HCC)   Total Time spent with patient: 30 minutes  Musculoskeletal: Strength & Muscle Tone: within normal limits Gait & Station: normal Patient leans: N/A  Psychiatric Specialty Exam  Presentation  General Appearance:  Slim build, well-groomed, good relatedness, appropriate behavior. No EPS.   Eye Contact: Good.   Speech: Spontaneous, normal rate, tone and volume.  Normal prosody of speech.   Mood and Affect  Mood: Euthymic.   Affect: Full range and mood congruent.   Thought Process  Thought Processes: Goal directed.   Descriptions of Associations: Intact.   Orientation:Full (Time, Place and Person)   Thought Content: Future-oriented.  No negative rumination.  No negative rumination.  No guilty rumination.  No delusional theme.  No current suicidal thoughts.  No homicidal thoughts.  No thoughts of violence.   Hallucinations: No hallucinations in any modality.   Sensorium  Memory: Good.   Judgment: Good.   Insight: Good as patient is aware of his mental illness.  He wants to get help.   Executive Functions  Concentration: Good   Attention Span: Good.   Recall: Good.   Fund of Knowledge: Good.   Language: Good     Psychomotor Activity  Normal psychomotor activity  Physical Exam: Physical Exam ROS Blood pressure 133/76, pulse 76, temperature 98 F (36.7 C), temperature source Oral, resp. rate 16, height 5\' 6"  (1.676 m), weight 54 kg, SpO2 99%. Body mass index is 19.21 kg/m.  Mental Status Per Nursing Assessment::   On Admission:  NA  Demographic Factors:  Male, Divorced or widowed, Low socioeconomic status, Living alone, and Unemployed  Loss Factors: Decrease in vocational status and Decline in physical  health  Historical Factors: Family history of mental illness or substance abuse  Risk Reduction Factors:   Sense of responsibility to family and Positive therapeutic relationship  Continued Clinical Symptoms:  Patient is no longer having any features of depression.  Chronic pain is no longer on the front burner.  Cognitive Features That Contribute To Risk:  None    Suicide Risk:  Minimal: Patient is currently not suicidal.  Patient is not homicidal.  Patient is not endorsing any thoughts of violence.  Modifiable risk factor targeted during this admission as depression.  Patient was started on duloxetine.  Patient has tolerated the medication well.  Chronic pain is more manageable.  Patient has been able to think through his psychosocial stressors.  There are no new psychosocial stressors.  No use of alcohol or any psychoactive substance lately. Patient is currently not a danger to himself or to others.  He is stable for care at a lower setting.  Follow-up Information     [facility]. Go on 07/06/2023.   Why: You have an appointment on 07/06/23 at 11:00 am, to obtain therapy and medication management services, in person. Contact information: [address] [place], [place] [address]  Phone: [phone], [phone]                Plan Of Care/Follow-up recommendations:  Patient will stay on his current regimen and follow up as scheduled.  Georgiann Cocker, MD 07/04/2023, 9:40 AM

## 2023-07-04 NOTE — Group Note (Signed)
 Date:  07/04/2023 Time:  1:17 PM  Group Topic/Focus:  Emotional Education:   The focus of this group is to discuss what feelings/emotions are, and how they are experienced.    Participation Level:  Did Not Attend  Participation Quality:  Did Not Attend  Affect:  Did Not Attend  Cognitive:  Did Not Attend  Insight: None  Engagement in Group:  Did Not Attend  Modes of Intervention:  Did Not Attend  Additional Comments:  Did Not Attend  Estill Dooms 07/04/2023, 1:17 PM

## 2023-07-04 NOTE — Progress Notes (Signed)
   07/03/23 2000  Psych Admission Type (Psych Patients Only)  Admission Status Voluntary  Psychosocial Assessment  Patient Complaints Insomnia  Eye Contact Fair  Facial Expression Sad  Affect Anxious  Speech Logical/coherent  Interaction Assertive  Motor Activity Restless  Appearance/Hygiene Unremarkable  Behavior Characteristics Cooperative;Appropriate to situation  Mood Pleasant  Thought Process  Coherency Circumstantial  Content Preoccupation  Delusions None reported or observed  Perception WDL  Hallucination None reported or observed  Judgment Poor  Confusion None  Danger to Self  Current suicidal ideation? Denies  Agreement Not to Harm Self Yes  Description of Agreement Verbal  Danger to Others  Danger to Others None reported or observed

## 2023-07-04 NOTE — Progress Notes (Signed)
 Pt awake and alert. Visible in hallway for medication. Pt appears irritable and reports " I am just ready to go". Pt denies SI, HI or AVH at this time.  Pt took his medication and went back to his room.

## 2023-07-04 NOTE — Progress Notes (Signed)
  [facility] Adult Case Management Discharge Plan :  Will you be returning to the same living situation after discharge:  Yes,  Patient share that he will be going home At discharge, do you have transportation home?: Yes,  Patient will take a taxi Do you have the ability to pay for your medications: Yes,  Patient has insurance  Release of information consent forms completed and in the chart;  Patient's signature needed at discharge.  Patient to Follow up at:  Follow-up Information     [facility]. Go on 07/06/2023.   Why: You have an appointment on 07/06/23 at 11:00 am, to obtain therapy and medication management services, in person. Contact information: [address] [place], [place] [address]  Phone: [phone], [phone]                Next level of care provider has access to [facility] Link:no  Safety Planning and Suicide Prevention discussed: Yes,  CSW discuss SPE with patient before time of discharge     Has patient been referred to the Quitline?: Patient refused referral for treatment  Patient has been referred for addiction treatment: Patient refused referral for treatment.  Louise Rawson O Effrey Davidow, LCSWA 07/04/2023, 9:09 AM

## 2023-07-04 NOTE — Discharge Summary (Signed)
 Physician Discharge Summary Note  Patient:  [name] is an 53 y.o., male MRN:  [id number] DOB:  [birth year] Patient phone:  [phone] (home)  Patient address:   [address] [place] [address],  Total Time spent with patient: 45 minutes  Date of Admission:  06/30/2023 Date of Discharge: 07/04/2023  Reason for Admission:   53 year old African-American male, single, unemployed, lives alone.  Background history of alcohol use disorder in full remission, chronic pain and MDD recurrent.  Self-presented to behavioral health urgent care on account of worsening depression in context of chronic pain.  Patient seeking pain management but expressed thoughts of suicide which led to psychiatric admission. Routine labs significant for mild hypokalemia.  UDS positive for THC.  No alcohol detected.   Chart reviewed today.  Patient discussed at multidisciplinary team meeting.   Nursing staff reports that patient has been focused on being prescribed medication for pain.  No challenging behavior on the unit.   Patient reports an extensive history of severe alcohol use disorder.  States that he has been sober for 10 years.  Patient has been treated for depression over the years.  States that he was recently investigated and found that he has a slipped disk.  Patient states that this has been the major cause of pain to his lower back.  States that the pain is debilitating to the point that he feels like life is not worth living anymore.  Patient reports having passive death wish but no active plans of harming himself.   Patient is very focused on being prescribed pain medication.  States that he is allergic to a lot of NSAIDs and Tylenol.  States that those type of medication do not work for him.  He is focused on getting opioids prescribed.   Patient reports worsening depression lately.  States that he has been dwelling on all the negative things that have happened in his life.  He lost two  of his children over the years.  States that he has two living children who do not have anything to do with him.  Patient states that he still has to pay back child support for his last child.  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  States that with his back pain has been difficult for him to work.  Patient reports being stressed by his financial situation and his physical ailments.  Pain affects his quality of sleep at night.   Patient reports use of THC.  No use of any other psychoactive substance.  There is no evidence of mania.  No evidence of psychosis.  No PTSD symptoms.  No evidence of OCD.   Review of symptoms essentially as above.   Principal Problem: MDD (major depressive disorder), recurrent episode, severe (HCC) Discharge Diagnoses: Principal Problem:   MDD (major depressive disorder), recurrent episode, severe (HCC)   Past Psychiatric History:  Patient reports an extensive history of alcohol use disorder.  He reports associated mood symptoms.  He has been admitted repeatedly on account of alcohol-induced mood disorder and suicidal thoughts.  No past suicidal behavior.  No past self-injurious behavior. He has no recollection of medications that has been used to treat his depression.  States that depression is mostly related to pain since he stopped using alcohol. No past manic symptoms.  No past psychotic symptoms.  No past history of violent behavior. No past history of neuromodulation. Patient was seen rehab a couple of times.  Alcohol as primary drug of choice.  Past Medical History:  Past Medical History:  Diagnosis Date   Alcohol abuse    Anemia    Colitis    Severe major depression with psychotic features (HCC)    History reviewed. No pertinent surgical history. Family History:  Family History  Problem Relation Age of Onset   Hypertension Mother    Hypertension Father    Hypertension Other    Family Psychiatric  History:  Strong family  history of alcohol use disorder on both sides of his family. Both parents suffered from cirrhosis related to alcohol use disorder. No other family history of mental illness. No family history of suicide.   Social History:  Social History   Substance and Sexual Activity  Alcohol Use No   Comment: former  last use 5+ year ago     Social History   Substance and Sexual Activity  Drug Use Yes   Frequency: 5.0 times per week   Types: Marijuana   Comment: 10 years 11/03    Social History   Socioeconomic History   Marital status: Single    Spouse name: Not on file   Number of children: Not on file   Years of education: Not on file   Highest education level: Not on file  Occupational History   Not on file  Tobacco Use   Smoking status: Every Day    Types: Cigarettes   Smokeless tobacco: Never  Vaping Use   Vaping status: Never Used  Substance and Sexual Activity   Alcohol use: No    Comment: former  last use 5+ year ago   Drug use: Yes    Frequency: 5.0 times per week    Types: Marijuana    Comment: 10 years 11/03   Sexual activity: Yes    Birth control/protection: Condom  Other Topics Concern   Not on file  Social History Narrative   Not on file   Social Drivers of Health   Financial Resource Strain: High Risk (03/19/2019)   Overall Financial Resource Strain (CARDIA)    Difficulty of Paying Living Expenses: Very hard  Food Insecurity: Food Insecurity Present (03/23/2023)   Hunger Vital Sign    Worried About Programme researcher, broadcasting/film/video in the Last Year: Often true    Ran Out of Food in the Last Year: Often true  Transportation Needs: Unmet Transportation Needs (03/23/2023)   PRAPARE - Administrator, Civil Service (Medical): Yes    Lack of Transportation (Non-Medical): Yes  Physical Activity: Not on file  Stress: Not on file  Social Connections: Patient Declined (06/30/2023)   Social Connection and Isolation Panel [NHANES]    Frequency of Communication with  Friends and Family: Patient declined    Frequency of Social Gatherings with Friends and Family: Patient declined    Attends Religious Services: Patient declined    Database administrator or Organizations: Patient declined    Attends Banker Meetings: Patient declined    Marital Status: Patient declined    Hospital Course:   Patient was admitted on suicide precautions.  Duloxetine was started to target depression.  There is also evidence for this with chronic pain.  Patient was preoccupied with pain management during his initial hospital days.  Topical lidocaine was used.  As needed Tylenol was recommended to patient did not take this.  He did not exhibit any distress from pain during his hospital stay.  As his medication kicked in, his mood  improved.  He started interacting more with the milieu.  He maintained a normal sleep-wake cycle.  Patient attended to his basic needs during his hospital stay.  He did not engage in any self-injurious behavior.  He did not engage in any disruptive behavior.  He did not require any psychiatric or medical emergency measures during his hospital stay.  At interview today.  Patient feels good and ready to be discharged.  He is not endorsing any new worries.  He plans to address his psychosocial issues practically.  He is not endorsing any negative ruminative thoughts.  No suicidal ideation.  No homicidal thoughts.  No thoughts of violence.  There is no overwhelming anxiety.  There are no manic symptoms.  There are no psychotic symptoms.  Nursing staff reports that patient is ready for discharge.  He is not endorsing any worries or any concerns.  He slept well last night.  Patient and team agrees that he is back to his baseline.  Team agrees with discharge today.  Physical Findings: AIMS:  , ,  ,  ,    CIWA:    COWS:     Musculoskeletal: Strength & Muscle Tone: within normal limits Gait & Station: normal Patient leans: N/A   Psychiatric  Specialty Exam:  Presentation  General Appearance:  Slim build, well-groomed, good relatedness, appropriate behavior. No EPS.   Eye Contact: Good.   Speech: Spontaneous, normal rate, tone and volume.  Normal prosody of speech.   Mood and Affect  Mood: Euthymic.   Affect: Full range and mood congruent.   Thought Process  Thought Processes: Goal directed.   Descriptions of Associations: Intact.   Orientation:Full (Time, Place and Person)   Thought Content: Future-oriented.  No negative rumination.  No negative rumination.  No guilty rumination.  No delusional theme.  No current suicidal thoughts.  No homicidal thoughts.  No thoughts of violence.   Hallucinations: No hallucinations in any modality.   Sensorium  Memory: Good.   Judgment: Good.   Insight: Good as patient is aware of his mental illness.  He wants to get help.   Executive Functions  Concentration: Good   Attention Span: Good.   Recall: Good.   Fund of Knowledge: Good.   Language: Good     Psychomotor Activity  Normal psychomotor activity   Physical Exam: Physical Exam ROS Blood pressure 133/76, pulse 76, temperature 98 F (36.7 C), temperature source Oral, resp. rate 16, height 5\' 6"  (1.676 m), weight 54 kg, SpO2 99%. Body mass index is 19.21 kg/m.   Social History   Tobacco Use  Smoking Status Every Day   Types: Cigarettes  Smokeless Tobacco Never   Tobacco Cessation:  A prescription for an FDA-approved tobacco cessation medication provided at discharge   Blood Alcohol level:  Lab Results  Component Value Date   [facility] <10 06/29/2023   ETH <10 03/22/2023    Metabolic Disorder Labs:  Lab Results  Component Value Date   HGBA1C 5.2 08/27/2014   MPG 103 08/27/2014   MPG 105 03/08/2013   No results found for: "PROLACTIN" No results found for: "CHOL", "TRIG", "HDL", "CHOLHDL", "VLDL", "LDLCALC"  See Psychiatric Specialty Exam and Suicide Risk Assessment completed by  Attending Physician prior to discharge.  Discharge destination:  Home  Is patient on multiple antipsychotic therapies at discharge:  No   Has Patient had three or more failed trials of antipsychotic monotherapy by history:  No  Recommended Plan for Multiple Antipsychotic Therapies: NA  Discharge  Instructions     Diet - low sodium heart healthy   Complete by: As directed    Increase activity slowly   Complete by: As directed       Allergies as of 07/04/2023       Reactions   Aspirin Hives, Nausea And Vomiting   Ibuprofen Hives, Nausea And Vomiting   Pork-derived Products Other (See Comments)   Stomach pain. Pt does not eat pork   Acetaminophen Nausea Only   Sertraline Diarrhea, Other (See Comments)   Acute GI distress with diarrhea and pain.         Medication List     STOP taking these medications    hydrOXYzine 25 MG tablet Commonly known as: ATARAX       TAKE these medications      Indication  DULoxetine 30 MG capsule Commonly known as: CYMBALTA Take 1 capsule (30 mg total) by mouth daily. Start taking on: July 05, 2023  Indication: Major Depressive Disorder, Musculoskeletal Pain   lidocaine 5 % Commonly known as: LIDODERM Place 1 patch onto the skin daily for 14 days. Remove & Discard patch within 12 hours or as directed by MD  Indication: Low back pain.   nicotine 14 mg/24hr patch Commonly known as: NICODERM CQ - dosed in mg/24 hours Place 1 patch (14 mg total) onto the skin daily. Start taking on: July 05, 2023  Indication: Nicotine Addiction        Follow-up Information     [facility]. Go on 07/06/2023.   Why: You have an appointment on 07/06/23 at 11:00 am, to obtain therapy and medication management services, in person. Contact information: [address] [place], [place] [address]  Phone: [phone], [phone]                Follow-up recommendations: Patient advised to stay on his medicine as recommended.  Patient  will follow up as recommended.  No dietary restrictions.  No activity restrictions from a psychiatric perspective.  Signed: Georgiann Cocker, MD 07/04/2023, 9:47 AM

## 2023-07-12 ENCOUNTER — Encounter: Payer: Self-pay | Admitting: Podiatry

## 2023-07-12 ENCOUNTER — Ambulatory Visit (INDEPENDENT_AMBULATORY_CARE_PROVIDER_SITE_OTHER): Payer: MEDICAID | Admitting: Podiatry

## 2023-07-12 DIAGNOSIS — M79609 Pain in unspecified limb: Secondary | ICD-10-CM

## 2023-07-12 DIAGNOSIS — M79676 Pain in unspecified toe(s): Secondary | ICD-10-CM

## 2023-07-12 DIAGNOSIS — B351 Tinea unguium: Secondary | ICD-10-CM | POA: Diagnosis not present

## 2023-07-12 NOTE — Progress Notes (Signed)
  Subjective:  Patient ID: [name], male    DOB: 1970/07/24,   MRN: [id number]  No chief complaint on file.   53 y.o. male presents for concern of thickened elongated and painful nails that are difficult to trim. Requesting to have them trimmed today. Patient unable to trim own nails due to mobility issue. 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  PCP:  Patient, No Pcp Per    . Denies any other pedal complaints. Denies n/v/f/c.   Past Medical History:  Diagnosis Date   Alcohol abuse    Anemia    Colitis    Severe major depression with psychotic features (HCC)     Objective:  Physical Exam: Vascular: DP/PT pulses 2/4 bilateral. CFT <3 seconds. Absent hair growth on digits. Edema noted to bilateral lower extremities. Xerosis noted bilaterally.  Skin. No lacerations or abrasions bilateral feet. Nails 1-5 bilateral  are thickened discolored and elongated with subungual debris.  Musculoskeletal: MMT 5/5 bilateral lower extremities in DF, PF, Inversion and Eversion. Deceased ROM in DF of ankle joint.  Neurological: Sensation intact to light touch. Protective sensation diminished.  bilateral.    Assessment:   1. Pain due to onychomycosis of nail      Plan:  Patient was evaluated and treated and all questions answered. -Mechanically debrided all nails 1-5 bilateral using sterile nail nipper and filed with dremel without incident  -Answered all patient questions -Patient to return  in 3 months for at risk foot care -Patient advised to call the office if any problems or questions arise in the meantime.   Louann Sjogren, DPM

## 2023-11-10 ENCOUNTER — Ambulatory Visit (HOSPITAL_COMMUNITY)
Admission: EM | Admit: 2023-11-10 | Discharge: 2023-11-10 | Disposition: A | Discharge status: Home / Self Care | Payer: MEDICAID

## 2023-11-10 ENCOUNTER — Encounter (HOSPITAL_COMMUNITY): Payer: Self-pay

## 2023-11-10 DIAGNOSIS — M5431 Sciatica, right side: Secondary | ICD-10-CM | POA: Diagnosis not present

## 2023-11-10 MED ORDER — PREDNISONE 20 MG PO TABS: 40.0000 mg | ORAL_TABLET | Freq: Every day | ORAL | 0 refills | Status: AC

## 2023-11-10 MED ORDER — BACLOFEN 10 MG PO TABS: 10.0000 mg | ORAL_TABLET | Freq: Three times a day (TID) | ORAL | 0 refills | Status: DC

## 2023-11-10 MED ORDER — KETOROLAC TROMETHAMINE 60 MG/2ML IM SOLN
60.0000 mg | Freq: Once | INTRAMUSCULAR | Status: AC
  Administered 2023-11-10: 60 mg via INTRAMUSCULAR

## 2023-11-10 MED ORDER — KETOROLAC TROMETHAMINE 60 MG/2ML IM SOLN
INTRAMUSCULAR | Status: AC
  Filled 2023-11-10: qty 2

## 2023-11-10 NOTE — Discharge Instructions (Addendum)
  1. Sciatica of right side (Primary) - ketorolac  (TORADOL ) IM injection 60 mg given in UC for acute pain and lower back and right leg - AMB referral to sports medicine for follow-up evaluation of ongoing lower back pain and right leg radiculopathy. - predniSONE  (DELTASONE ) 20 MG tablet; Take 2 tablets (40 mg total) by mouth daily for 5 days.  Dispense: 10 tablet; Refill: 0 - baclofen  (LIORESAL ) 10 MG tablet; Take 1 tablet (10 mg total) by mouth 3 (three) times daily.  Dispense: 30 each; Refill: 0 -Continue to monitor symptoms for any change in severity if there is any escalation of current symptoms or development of new symptoms follow-up in ER for further evaluation and management.
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
## 2023-11-10 NOTE — ED Triage Notes (Signed)
 Patient here today with c/o right side low back pain that radiates down right the back of his right leg down to the back of his knee. Patient has increased pain with walking.

## 2023-11-23 ENCOUNTER — Other Ambulatory Visit: Payer: Self-pay

## 2023-11-23 ENCOUNTER — Emergency Department (HOSPITAL_COMMUNITY)
Admission: EM | Admit: 2023-11-23 | Discharge: 2023-11-23 | Disposition: A | Discharge status: Other Acute Inpatient Hospital | Payer: MEDICAID

## 2023-11-23 ENCOUNTER — Inpatient Hospital Stay (HOSPITAL_COMMUNITY)
Admission: AD | Admit: 2023-11-23 | Discharge: 2023-11-28 | DRG: 885 | Disposition: A | Discharge status: Home / Self Care | Payer: MEDICAID | Source: Intra-hospital | Attending: Student in an Organized Health Care Education/Training Program | Admitting: Student in an Organized Health Care Education/Training Program

## 2023-11-23 ENCOUNTER — Encounter (HOSPITAL_COMMUNITY): Payer: Self-pay

## 2023-11-23 DIAGNOSIS — F1910 Other psychoactive substance abuse, uncomplicated: Secondary | ICD-10-CM | POA: Diagnosis not present

## 2023-11-23 DIAGNOSIS — Z886 Allergy status to analgesic agent status: Secondary | ICD-10-CM | POA: Diagnosis not present

## 2023-11-23 DIAGNOSIS — R45851 Suicidal ideations: Secondary | ICD-10-CM | POA: Insufficient documentation

## 2023-11-23 DIAGNOSIS — F17210 Nicotine dependence, cigarettes, uncomplicated: Secondary | ICD-10-CM | POA: Diagnosis present

## 2023-11-23 DIAGNOSIS — F332 Major depressive disorder, recurrent severe without psychotic features: Principal | ICD-10-CM | POA: Diagnosis present

## 2023-11-23 DIAGNOSIS — Z8249 Family history of ischemic heart disease and other diseases of the circulatory system: Secondary | ICD-10-CM | POA: Diagnosis not present

## 2023-11-23 DIAGNOSIS — F1021 Alcohol dependence, in remission: Secondary | ICD-10-CM | POA: Diagnosis present

## 2023-11-23 DIAGNOSIS — Z5982 Transportation insecurity: Secondary | ICD-10-CM

## 2023-11-23 DIAGNOSIS — M545 Low back pain, unspecified: Secondary | ICD-10-CM | POA: Diagnosis not present

## 2023-11-23 DIAGNOSIS — F419 Anxiety disorder, unspecified: Secondary | ICD-10-CM | POA: Diagnosis present

## 2023-11-23 DIAGNOSIS — F32A Depression, unspecified: Secondary | ICD-10-CM

## 2023-11-23 DIAGNOSIS — Z5986 Financial insecurity: Secondary | ICD-10-CM | POA: Diagnosis not present

## 2023-11-23 DIAGNOSIS — Z5941 Food insecurity: Secondary | ICD-10-CM

## 2023-11-23 DIAGNOSIS — G8929 Other chronic pain: Secondary | ICD-10-CM | POA: Diagnosis present

## 2023-11-23 DIAGNOSIS — F12288 Cannabis dependence with other cannabis-induced disorder: Secondary | ICD-10-CM | POA: Diagnosis present

## 2023-11-23 DIAGNOSIS — F322 Major depressive disorder, single episode, severe without psychotic features: Principal | ICD-10-CM | POA: Diagnosis present

## 2023-11-23 DIAGNOSIS — F1220 Cannabis dependence, uncomplicated: Secondary | ICD-10-CM | POA: Diagnosis present

## 2023-11-23 DIAGNOSIS — Z888 Allergy status to other drugs, medicaments and biological substances status: Secondary | ICD-10-CM | POA: Diagnosis not present

## 2023-11-23 DIAGNOSIS — Z91014 Allergy to mammalian meats: Secondary | ICD-10-CM

## 2023-11-23 DIAGNOSIS — F1290 Cannabis use, unspecified, uncomplicated: Secondary | ICD-10-CM | POA: Diagnosis present

## 2023-11-23 LAB — COMPREHENSIVE METABOLIC PANEL WITH GFR
ALT: 12 U/L (ref 0–44)
AST: 23 U/L (ref 15–41)
Albumin: 4.5 g/dL (ref 3.5–5.0)
Alkaline Phosphatase: 35 U/L — ABNORMAL LOW (ref 38–126)
Anion gap: 13 (ref 5–15)
BUN: 11 mg/dL (ref 6–20)
CO2: 26 mmol/L (ref 22–32)
Calcium: 9.9 mg/dL (ref 8.9–10.3)
Chloride: 106 mmol/L (ref 98–111)
Creatinine, Ser: 1 mg/dL (ref 0.61–1.24)
GFR, Estimated: >60 mL/min (ref >60)
Glucose, Bld: 111 mg/dL — ABNORMAL HIGH (ref 70–99)
Potassium: 4.2 mmol/L (ref 3.5–5.1)
Sodium: 145 mmol/L (ref 135–145)
Total Bilirubin: 0.9 mg/dL (ref 0.0–1.2)
Total Protein: 7.4 g/dL (ref 6.5–8.1)

## 2023-11-23 LAB — RAPID URINE DRUG SCREEN, HOSP PERFORMED
Amphetamines: NOT DETECTED
Barbiturates: NOT DETECTED
Benzodiazepines: NOT DETECTED
Cocaine: NOT DETECTED
Opiates: NOT DETECTED
Tetrahydrocannabinol: POSITIVE — AB

## 2023-11-23 LAB — ETHANOL: Alcohol, Ethyl (B): <15 mg/dL (ref <15)

## 2023-11-23 LAB — CBC
HCT: 39.7 % (ref 39.0–52.0)
Hemoglobin: 12.8 g/dL — ABNORMAL LOW (ref 13.0–17.0)
MCH: 31.1 pg (ref 26.0–34.0)
MCHC: 32.2 g/dL (ref 30.0–36.0)
MCV: 96.6 fL (ref 80.0–100.0)
Platelets: 255 K/uL (ref 150–400)
RBC: 4.11 MIL/uL — ABNORMAL LOW (ref 4.22–5.81)
RDW: 13.2 % (ref 11.5–15.5)
WBC: 6.4 K/uL (ref 4.0–10.5)
nRBC: 0 % (ref 0.0–0.2)

## 2023-11-23 MED ORDER — DIPHENHYDRAMINE HCL 50 MG/ML IJ SOLN: 50.0000 mg | Freq: Three times a day (TID) | INTRAMUSCULAR | Status: DC | PRN

## 2023-11-23 MED ORDER — MAGNESIUM HYDROXIDE 400 MG/5ML PO SUSP: 30.0000 mL | Freq: Every day | ORAL | Status: DC | PRN

## 2023-11-23 MED ORDER — LORAZEPAM 2 MG/ML IJ SOLN: 2.0000 mg | Freq: Three times a day (TID) | INTRAMUSCULAR | Status: DC | PRN

## 2023-11-23 MED ORDER — DIPHENHYDRAMINE HCL 25 MG PO CAPS: 50.0000 mg | ORAL_CAPSULE | Freq: Three times a day (TID) | ORAL | Status: DC | PRN

## 2023-11-23 MED ORDER — DULOXETINE HCL 30 MG PO CPEP
30.0000 mg | ORAL_CAPSULE | Freq: Every day | ORAL | Status: DC
  Administered 2023-11-23: 30 mg via ORAL
  Filled 2023-11-23: qty 1

## 2023-11-23 MED ORDER — DULOXETINE HCL 30 MG PO CPEP
30.0000 mg | ORAL_CAPSULE | Freq: Every day | ORAL | Status: DC
  Administered 2023-11-24 – 2023-11-28 (×5): 30 mg via ORAL
  Filled 2023-11-23 (×3): qty 1
  Filled 2023-11-23: qty 8
  Filled 2023-11-23 (×2): qty 1

## 2023-11-23 MED ORDER — HALOPERIDOL 5 MG PO TABS: 5.0000 mg | ORAL_TABLET | Freq: Three times a day (TID) | ORAL | Status: DC | PRN

## 2023-11-23 MED ORDER — HALOPERIDOL LACTATE 5 MG/ML IJ SOLN: 5.0000 mg | Freq: Three times a day (TID) | INTRAMUSCULAR | Status: DC | PRN

## 2023-11-23 MED ORDER — HALOPERIDOL LACTATE 5 MG/ML IJ SOLN: 10.0000 mg | Freq: Three times a day (TID) | INTRAMUSCULAR | Status: DC | PRN

## 2023-11-23 MED ORDER — HYDROXYZINE HCL 25 MG PO TABS
25.0000 mg | ORAL_TABLET | Freq: Three times a day (TID) | ORAL | Status: DC | PRN
  Administered 2023-11-26: 25 mg via ORAL
  Filled 2023-11-23: qty 1

## 2023-11-23 MED ORDER — TRAZODONE HCL 50 MG PO TABS
50.0000 mg | ORAL_TABLET | Freq: Every evening | ORAL | Status: DC | PRN
  Administered 2023-11-26 – 2023-11-27 (×2): 50 mg via ORAL
  Filled 2023-11-23 (×2): qty 1

## 2023-11-23 MED ORDER — ALUM & MAG HYDROXIDE-SIMETH 200-200-20 MG/5ML PO SUSP: 30.0000 mL | ORAL | Status: DC | PRN

## 2023-11-23 NOTE — ED Triage Notes (Signed)
 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 Pt does endorse SI, denies HI/AVH. Pt states he has been sober for 11 years and is afraid he'll start drinking again and kill himself.

## 2023-11-23 NOTE — Progress Notes (Signed)
 Pt has been accepted to [facility] on 11/23/2023 Bed assignment: 307-01  Pt meets inpatient criteria per: Elveria Batter NP  Attending Physician will be: Dr. Prentis    Report can be called un:lwpu: Adult unit: [phone]  Pt can arrive after [facility] WILL UPDATE   Care Team Notified: [facility] Cherylynn Ernst RN, Elveria Batter NP,Allison Branch RN  [place] Jisell Majer LCSW-A   11/23/2023 3:19 PM

## 2023-11-23 NOTE — ED Notes (Signed)
 Pt lying in stretcher, eyes closed, visible chest rise and fall. No distress noted

## 2023-11-23 NOTE — ED Notes (Signed)
 Belongings placed in locker number 4

## 2023-11-23 NOTE — ED Notes (Signed)
 Provided patient with sandwhich tray

## 2023-11-23 NOTE — Consult Note (Signed)
 [facility] Psychiatric Consult Initial  Patient Name: .Giovanni Biby  MRN: [id number]  DOB: 1970/12/09  Consult Order details:  Orders (From admission, onward)     Start     Ordered   11/23/23 1345  CONSULT TO CALL ACT TEAM       Ordering Provider: Neysa Caron PARAS, DO  Provider:  (Not yet assigned)  Question:  Reason for Consult?  Answer:  Psych consult   11/23/23 1344             Mode of Visit: In person    Psychiatry Consult Evaluation  Service Date: November 23, 2023 LOS:  LOS: 0 days  Chief Complaint I dont want to be here.  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 male admitted: Presented to the EDfor 11/23/2023 11:20 AM for MDD, alcohol  use disorder and suicidal ideations. He carries the psychiatric diagnoses of and a past medical history that includes chronic pain, anemia, and colitis.  His current presentation of depression that includes feelings of helplessness, hopelessness, worthlessness, guilt, decreased appetite, no motivation, and decreased sleep is most consistent with MDD. He meets criteria for inpatient psychiatric admission based on current symptoms.  He currently does not take any outpatient psychotropic medications.  He currently has no outpatient psychiatric services in place.  On initial examination, patient is observed sitting in his bed with the covers over his head.  He is alert/oriented x 4, cooperative, and attentive.  He is slightly irritated with questions and states, lady I do not want to be here.  He endorses depression with feelings of helplessness, hopelessness, worthlessness, guilt, decreased appetite, and no motivation, decreased sleep.  He reports feeling lonely and has no support.  He identifies multiple triggers that are constantly on his mind.  He has had many family members passed away this includes 2 of his 4 children.  His 66 year old daughter died of  COVID.  He is unable to find employment and has chronic pain.  Therefore he is having financial problems and relationship problems.  Over the past few weeks his depression has become overwhelming and he is beginning to have thoughts of not wanting to be here.  He was hospitalized earlier this year 06/2023 and states upon discharge he did feel mentally better.  However he did not follow-up with outpatient services for very long and stopped taking his medications.  Reports it is because he had no funds to buy medications.  Since that time he has noticed a steady increase in his depression.  He also thinks that his marijuana plays a part in his depression.  He smokes marijuana all day and excessive amounts.  He would like to stop.  He currently endorses suicidal thoughts and cannot verbally contract for safety.  He denies any intent but states if he were to kill himself he would drink myself to death because at least I would enjoy it and it would be a slow death.  He denies homicidal ideations.  He denies any paranoia or delusional thought content.  He denies auditory/visual hallucinations.  He does not appear to be responding to internal/external stimuli.  Patient is agreeable to inpatient psychiatric admission and starting Cymbalta  30 mg daily.  Explained to patient that during a psychiatric admission no pain medications would be initiated he would only be taking Tylenol  and ibuprofen  as pain management is done in the outpatient setting.  He verbalized understanding.   Please see  plan below for detailed recommendations.   Diagnoses:  Active Hospital problems: Principal Problem:   MDD (major depressive disorder), recurrent severe, without psychosis (HCC)    Plan   ## Psychiatric Medication Recommendations:  Start Cymbalta  30 mg daily  ## Medical Decision Making Capacity: Not specifically addressed in this encounter  ## Further Work-up:  -- No further recommendations at this time  -- most  recent EKG on 11/23/2023 had QtC of 444 -- Pertinent labwork reviewed earlier this admission includes: CMP-glucose 111, alkaline phosphate 35.  CBC RBC 4.11, hemoglobin 12.8, UDS positive for marijuana, BAL<15   ## Disposition:-- We recommend inpatient psychiatric hospitalization when medically cleared. Patient is under voluntary admission status at this time; please IVC if attempts to leave hospital.  Patient accepted to [facility] for 11/23/2023  ## Behavioral / Environmental: - No specific recommendations at this time.     ## Safety and Observation Level:  - Based on my clinical evaluation, I estimate the patient to be at high risk of self harm in the current setting. - At this time, we recommend  routine. This decision is based on my review of the chart including patient's history and current presentation, interview of the patient, mental status examination, and consideration of suicide risk including evaluating suicidal ideation, plan, intent, suicidal or self-harm behaviors, risk factors, and protective factors. This judgment is based on our ability to directly address suicide risk, implement suicide prevention strategies, and develop a safety plan while the patient is in the clinical setting. Please contact our team if there is a concern that risk level has changed.  CSSR Risk Category:C-SSRS RISK CATEGORY: Error: Q3, 4, or 5 should not be populated when Q2 is No  Suicide Risk Assessment: Patient has following modifiable risk factors for suicide: active suicidal ideation, untreated depression, medication noncompliance, current symptoms: anxiety/panic, insomnia, impulsivity, anhedonia, hopelessness, triggering events, recent psychiatric hospitalization, and recent loss (death, isolation, vocation), which we are addressing by starting Cymbalta  30 mg daily and recommending inpatient psychiatric admission. Patient has following non-modifiable or demographic risk factors for suicide: male gender and  psychiatric hospitalization Patient has the following protective factors against suicide: Access to outpatient mental health care and Cultural, spiritual, or religious beliefs that discourage suicide  Thank you for this consult request. Recommendations have been communicated to the primary team.  We will recommend inpatient psychiatric admission at this time.   Elveria VEAR Batter, NP       History of Present Illness  Relevant Aspects of Hospital ED Course:  Admitted on 11/23/2023 for MDD, alcohol  use disorder and suicidal ideations. He carries the psychiatric diagnoses of and a past medical history that includes chronic pain, anemia, and colitis.  Patient Report:  I do not want to be here  Dr. Neysa EDP, this is a 53 year old male presenting emergency department with suicidal ideation. Feeling more depressed with thoughts of self-harm. When asked for specific plan states that he would drink himself to death. Denies toxic ingestion prior to arrival. No hallucinations or homicidal ideations. Denies prior history of suicide attempt. Has some chronic back pain that is unchanged, but no other physical complaint currently.  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 Pt does endorse SI, denies HI/AVH. Pt states he has been sober for 11 years and is afraid he'll start drinking again and kill himself   Psych ROS:  Depression: Helplessness, hopelessness, worthlessness, guilt, decreased appetite, no motivation, decreased sleep Anxiety: Endorses  Mania (lifetime and current): Denies Psychosis: (lifetime and current): History of psychosis  Collateral information:  Contacted-patient reports no collateral to be contacted  Review of Systems  Constitutional:  Negative for fever.  Respiratory:  Negative for cough and shortness of breath.   Musculoskeletal: Negative.   Neurological:  Negative for tremors and seizures.  Psychiatric/Behavioral:  Positive  for depression, substance abuse and suicidal ideas. The patient is nervous/anxious.      Psychiatric and Social History  Psychiatric History:  Information collected from chart review and patient  Prev Dx/Sx: Major depressive disorder and alcohol  use disorder. Current Psych Provider: None in place Home Meds (current): Currently takes no psychotropic medications Previous Med Trials: Cymbalta  in the past and felt it was helpful Therapy: No services in place  Prior Psych Hospitalization: Has had multiple inpatient psychiatric admissions with his last admission being 06/2023 Prior Self Harm: Denies Prior Violence: Denies  Family Psych History: States he does not know Per chart review, Strong family history of alcohol  use disorder on both sides of his family. Both parents suffered from cirrhosis related to alcohol  use disorder. No other family history of mental illness. No family history of suicide. Family Hx suicide: States he does not know  Social History:  Developmental Hx: Denies Educational Hx: GED Occupational Hx: Unemployed Legal Hx: Denies Living Situation: Lives alone in an apartment Spiritual Hx: Christian  Per chart review, Additional Social History: Patient was born and raised in [place] .  He was raised by his biological parents.  His parents drank a lot, states that home environment was chaotic.  His parents separated when he was a teenager.  He was primarily with his mother.  Patient states that his mother passed when he was 61 years of age.  He lived with his father thereafter. Patient was poorly adjusted at school.  He never liked school.  He left at 10th grade.  He went to trade school and plans to become a Psychologist, occupational.  Patient has never been married.  He has a total of 4 children.  2 of his children died years ago.  He has no communication with his living children.  He is estranged from his children's mother.  Patient is currently on section 8 housing.  He lives alone.  No  steady income.  No supportive relationship.  No forensic history.  No military history.  Access to weapons/lethal means: Denies  Substance History Alcohol : Denies-has history of alcohol  use disorder Denies any history of alcohol  withdrawal seizures or delirium tremens Illicit drugs: Marijuana daily in excess Prescription drug abuse: Denies Rehab hx: Multiple times does not remember where he has been for alcohol  use disorder  Exam Findings  Physical Exam:  Vital Signs:  Temp:  [98.5 F (36.9 C)] 98.5 F (36.9 C) (07/22 1123) Pulse Rate:  [48] 48 (07/22 1123) Resp:  [18] 18 (07/22 1123) BP: (113)/(72) 113/72 (07/22 1123) SpO2:  [99 %] 99 % (07/22 1123) Weight:  [52.2 kg] 52.2 kg (07/22 1130) Blood pressure 113/72, pulse (!) 48, temperature 98.5 F (36.9 C), resp. rate 18, height 5' 7 (1.702 m), weight 52.2 kg, SpO2 99%. Body mass index is 18.01 kg/m.  Physical Exam Constitutional:      Appearance: Normal appearance.  Pulmonary:     Effort: No respiratory distress.  Musculoskeletal:        General: Normal range of motion.  Neurological:     Mental Status: He is alert and oriented to person, place, and time.  Psychiatric:  Attention and Perception: Attention and perception normal.        Mood and Affect: Mood is anxious and depressed.        Speech: Speech normal.        Behavior: Behavior is cooperative.        Thought Content: Thought content includes suicidal ideation. Thought content includes suicidal plan.        Cognition and Memory: Cognition normal.        Judgment: Judgment normal.     Mental Status Exam: General Appearance: Disheveled  Orientation:  Full (Time, Place, and Person)  Memory:  Immediate;   Good Recent;   Good Remote;   Good  Concentration:  Concentration: Good and Attention Span: Good  Recall:  Good  Attention  Good  Eye Contact:  Fair  Speech:  Clear and Coherent and Normal Rate  Language:  Good  Volume:  Normal  Mood: Bad   Affect:  Depressed  Thought Process:  Coherent  Thought Content:  Logical  Suicidal Thoughts:  Yes.  with intent/plan  Homicidal Thoughts:  No  Judgement:  Fair  Insight:  Fair  Psychomotor Activity:  Normal  Akathisia:  No  Fund of Knowledge:  Good      Assets:  Communication Skills Desire for Improvement Housing Leisure Time Physical Health Resilience  Cognition:  WNL  ADL's:  Intact  AIMS (if indicated):        Other History   These have been pulled in through the EMR, reviewed, and updated if appropriate.  Family History:  The patient's family history includes Hypertension in his father, mother, and another family member.  Medical History: Past Medical History:  Diagnosis Date   Alcohol  abuse    Anemia    Colitis    Severe major depression with psychotic features [facility])     Surgical History: History reviewed. No pertinent surgical history.   Medications:   Current Facility-Administered Medications:    DULoxetine  (CYMBALTA ) DR capsule 30 mg, 30 mg, Oral, Daily, Mardy Elveria DEL, NP  Current Outpatient Medications:    baclofen  (LIORESAL ) 10 MG tablet, Take 1 tablet (10 mg total) by mouth 3 (three) times daily. (Patient not taking: Reported on 11/23/2023), Disp: 30 each, Rfl: 0   DULoxetine  (CYMBALTA ) 30 MG capsule, Take 1 capsule (30 mg total) by mouth daily. (Patient not taking: Reported on 11/10/2023), Disp: 30 capsule, Rfl: 0  Allergies: Allergies  Allergen Reactions   Aspirin Hives and Nausea And Vomiting   Ibuprofen  Hives and Nausea And Vomiting   Pork-Derived Products Other (See Comments)    Stomach pain. Pt does not eat pork   Acetaminophen  Nausea Only   Sertraline  Diarrhea and Other (See Comments)    Acute GI distress with diarrhea and pain.     Elveria DEL Mardy, NP

## 2023-11-23 NOTE — ED Provider Notes (Signed)
 [place] EMERGENCY DEPARTMENT AT [facility] Provider Note   CSN: [id number] Arrival date & time: 11/23/23  1115     Patient presents with: Psychiatric Evaluation   [name] is a 53 y.o. male.   This is a 53 year old male presenting emergency department with suicidal ideation.  Feeling more depressed with thoughts of self-harm.  When asked for specific plan states that he would drink himself to death.  Denies toxic ingestion prior to arrival.  No hallucinations or homicidal ideations.  Denies prior history of suicide attempt.  Has some chronic back pain that is unchanged, but no other physical complaint currently.        Prior to Admission medications   Medication Sig Start Date End Date Taking? Authorizing Provider  baclofen  (LIORESAL ) 10 MG tablet Take 1 tablet (10 mg total) by mouth 3 (three) times daily. 11/10/23   Reddick, Johnathan B, NP  DULoxetine  (CYMBALTA ) 30 MG capsule Take 1 capsule (30 mg total) by mouth daily. Patient not taking: Reported on 11/10/2023 07/05/23   Hinda Jerrell LABOR, MD    Allergies: Aspirin, Ibuprofen , Pork-derived products, Acetaminophen , and Sertraline     Review of Systems  Updated Vital Signs BP 113/72 (BP Location: Right Arm)   Pulse (!) 48   Temp 98.5 F (36.9 C)   Resp 18   Ht 5' 7 (1.702 m)   Wt 52.2 kg   SpO2 99%   BMI 18.01 kg/m   Physical Exam Vitals and nursing note reviewed.  Constitutional:      General: He is not in acute distress.    Appearance: He is not toxic-appearing.  HENT:     Nose: Nose normal.     Mouth/Throat:     Mouth: Mucous membranes are moist.  Eyes:     Conjunctiva/sclera: Conjunctivae normal.  Cardiovascular:     Rate and Rhythm: Normal rate and regular rhythm.  Pulmonary:     Effort: Pulmonary effort is normal.  Abdominal:     General: Abdomen is flat. There is no distension.  Musculoskeletal:     Right lower leg: No edema.     Left lower leg: No edema.  Skin:    Capillary  Refill: Capillary refill takes less than 2 seconds.  Neurological:     Mental Status: He is alert and oriented to person, place, and time.  Psychiatric:        Mood and Affect: Mood normal.        Behavior: Behavior normal.     (all labs ordered are listed, but only abnormal results are displayed) Labs Reviewed  COMPREHENSIVE METABOLIC PANEL WITH GFR - Abnormal; Notable for the following components:      Result Value   Glucose, Bld 111 (*)    Alkaline Phosphatase 35 (*)    All other components within normal limits  CBC - Abnormal; Notable for the following components:   RBC 4.11 (*)    Hemoglobin 12.8 (*)    All other components within normal limits  RAPID URINE DRUG SCREEN, HOSP PERFORMED - Abnormal; Notable for the following components:   Tetrahydrocannabinol POSITIVE (*)    All other components within normal limits  ETHANOL    EKG: None  Radiology: No results found.   Procedures   Medications Ordered in the ED - No data to display  Medical Decision Making Is a well-appearing 53 year old male presenting emergency department for suicidal ideation.  He is afebrile, slightly bradycardic, but normal blood pressure.  EKG as noted in ED course. He has no red flags on history or physical exam regarding his back pain.  No indication for imaging at this time.  No fever or leukocytosis to suggest systemic infection.  No anemia.  Comprehensive panel with no metabolic derangements.  Normal kidney function.  Alcohol  level negative.  THC positive.  Will have TTS/psych evaluate patient given his complaint of suicidal ideation.  Amount and/or Complexity of Data Reviewed Labs: ordered. ECG/medicine tests: ordered.      Final diagnoses:  None    ED Discharge Orders     None          Neysa Caron PARAS, DO 11/23/23 1344

## 2023-11-23 NOTE — ED Notes (Signed)
 Vol consent faxed to [facility] adult unit

## 2023-11-24 ENCOUNTER — Encounter (HOSPITAL_COMMUNITY): Payer: Self-pay | Admitting: Psychiatry

## 2023-11-24 ENCOUNTER — Encounter (HOSPITAL_COMMUNITY): Payer: Self-pay

## 2023-11-24 MED ORDER — ONDANSETRON HCL 4 MG PO TABS: 4.0000 mg | ORAL_TABLET | Freq: Three times a day (TID) | ORAL | Status: DC | PRN

## 2023-11-24 MED ORDER — NALTREXONE HCL 50 MG PO TABS
25.0000 mg | ORAL_TABLET | Freq: Every day | ORAL | Status: DC
  Administered 2023-11-24 – 2023-11-27 (×4): 25 mg via ORAL
  Filled 2023-11-24: qty 1
  Filled 2023-11-24: qty 4
  Filled 2023-11-24 (×3): qty 1

## 2023-11-24 NOTE — Progress Notes (Signed)
 Patient admitted from [facility] ED after presenting with suicidal ideation, increased thoughts of depression, with thoughts to self harm. He still endorses SI, but he is currently able to contract for safety. He is Ox5, denies AVH. He was cooperative with treatment and reports his stressors was from losing his fiancee, his dad and his daughter over the years.

## 2023-11-24 NOTE — BH IP Treatment Plan (Signed)
 Interdisciplinary Treatment and Diagnostic Plan Update  11/24/2023 Time of Session: 10:05 AM [name] MRN: [id number]  Principal Diagnosis: MDD (major depressive disorder), recurrent severe, without psychosis (HCC)  Secondary Diagnoses: Principal Problem:   MDD (major depressive disorder), recurrent severe, without psychosis (HCC) Active Problems:   Alcohol  use disorder, severe, in sustained remission (HCC)   Cannabis use disorder, severe, dependence (HCC)   Current Medications:  Current Facility-Administered Medications  Medication Dose Route Frequency Provider Last Rate Last Admin   alum & mag hydroxide-simeth (MAALOX/MYLANTA) 200-200-20 MG/5ML suspension 30 mL  30 mL Oral Q4H PRN Coleman, Carolyn H, NP       haloperidol  (HALDOL ) tablet 5 mg  5 mg Oral TID PRN Mardy Elveria DEL, NP       And   diphenhydrAMINE  (BENADRYL ) capsule 50 mg  50 mg Oral TID PRN Mardy Elveria DEL, NP       haloperidol  lactate (HALDOL ) injection 5 mg  5 mg Intramuscular TID PRN Mardy Elveria DEL, NP       And   diphenhydrAMINE  (BENADRYL ) injection 50 mg  50 mg Intramuscular TID PRN Mardy Elveria DEL, NP       And   LORazepam  (ATIVAN ) injection 2 mg  2 mg Intramuscular TID PRN Mardy Elveria DEL, NP       haloperidol  lactate (HALDOL ) injection 10 mg  10 mg Intramuscular TID PRN Mardy Elveria DEL, NP       And   diphenhydrAMINE  (BENADRYL ) injection 50 mg  50 mg Intramuscular TID PRN Mardy Elveria DEL, NP       And   LORazepam  (ATIVAN ) injection 2 mg  2 mg Intramuscular TID PRN Mardy Elveria DEL, NP       DULoxetine  (CYMBALTA ) DR capsule 30 mg  30 mg Oral Daily Coleman, Carolyn H, NP   30 mg at 11/24/23 0757   hydrOXYzine  (ATARAX ) tablet 25 mg  25 mg Oral TID PRN Coleman, Carolyn H, NP       magnesium  hydroxide (MILK OF MAGNESIA) suspension 30 mL  30 mL Oral Daily PRN Coleman, Carolyn H, NP       naltrexone  (DEPADE) tablet 25 mg  25 mg Oral QHS Ji, Andrew, MD       ondansetron  (ZOFRAN ) tablet 4  mg  4 mg Oral Q8H PRN Lynnette Barter, MD       traZODone  (DESYREL ) tablet 50 mg  50 mg Oral QHS PRN Coleman, Carolyn H, NP       PTA Medications: Medications Prior to Admission  Medication Sig Dispense Refill Last Dose/Taking   baclofen  (LIORESAL ) 10 MG tablet Take 1 tablet (10 mg total) by mouth 3 (three) times daily. (Patient not taking: Reported on 11/23/2023) 30 each 0    DULoxetine  (CYMBALTA ) 30 MG capsule Take 1 capsule (30 mg total) by mouth daily. (Patient not taking: Reported on 11/10/2023) 30 capsule 0     Patient Stressors: Financial difficulties   Loss of significant others    Patient Strengths: Average or above average intelligence  Supportive family/friends   Treatment Modalities: Medication Management, Group therapy, Case management,  1 to 1 session with clinician, Psychoeducation, Recreational therapy.   Physician Treatment Plan for Primary Diagnosis: MDD (major depressive disorder), recurrent severe, without psychosis (HCC) Long Term Goal(s):     Short Term Goals:    Medication Management: Evaluate patient's response, side effects, and tolerance of medication regimen.  Therapeutic Interventions: 1 to 1 sessions, Unit Group sessions and Medication administration.  Evaluation of Outcomes: Not  Progressing  Physician Treatment Plan for Secondary Diagnosis: Principal Problem:   MDD (major depressive disorder), recurrent severe, without psychosis (HCC) Active Problems:   Alcohol  use disorder, severe, in sustained remission (HCC)   Cannabis use disorder, severe, dependence (HCC)  Long Term Goal(s):     Short Term Goals:       Medication Management: Evaluate patient's response, side effects, and tolerance of medication regimen.  Therapeutic Interventions: 1 to 1 sessions, Unit Group sessions and Medication administration.  Evaluation of Outcomes: Not Progressing   RN Treatment Plan for Primary Diagnosis: MDD (major depressive disorder), recurrent severe, without  psychosis (HCC) Long Term Goal(s): Knowledge of disease and therapeutic regimen to maintain health will improve  Short Term Goals: Ability to remain free from injury will improve, Ability to verbalize frustration and anger appropriately will improve, Ability to demonstrate self-control, Ability to participate in decision making will improve, Ability to verbalize feelings will improve, Ability to disclose and discuss suicidal ideas, Ability to identify and develop effective coping behaviors will improve, and Compliance with prescribed medications will improve  Medication Management: RN will administer medications as ordered by provider, will assess and evaluate patient's response and provide education to patient for prescribed medication. RN will report any adverse and/or side effects to prescribing provider.  Therapeutic Interventions: 1 on 1 counseling sessions, Psychoeducation, Medication administration, Evaluate responses to treatment, Monitor vital signs and CBGs as ordered, Perform/monitor CIWA, COWS, AIMS and Fall Risk screenings as ordered, Perform wound care treatments as ordered.  Evaluation of Outcomes: Not Progressing   LCSW Treatment Plan for Primary Diagnosis: MDD (major depressive disorder), recurrent severe, without psychosis (HCC) Long Term Goal(s): Safe transition to appropriate next level of care at discharge, Engage patient in therapeutic group addressing interpersonal concerns.  Short Term Goals: Engage patient in aftercare planning with referrals and resources, Increase social support, Increase ability to appropriately verbalize feelings, Increase emotional regulation, Facilitate acceptance of mental health diagnosis and concerns, Facilitate patient progression through stages of change regarding substance use diagnoses and concerns, Identify triggers associated with mental health/substance abuse issues, and Increase skills for wellness and recovery  Therapeutic Interventions:  Assess for all discharge needs, 1 to 1 time with Social worker, Explore available resources and support systems, Assess for adequacy in community support network, Educate family and significant other(s) on suicide prevention, Complete Psychosocial Assessment, Interpersonal group therapy.  Evaluation of Outcomes: Not Progressing   Progress in Treatment: Attending groups: attended some groups Participating in groups:  Yes Taking medication as prescribed: Yes. Toleration medication: Yes. Family/Significant other contact made: patient declined consents Patient understands diagnosis: Yes. Discussing patient identified problems/goals with staff: Yes. Medical problems stabilized or resolved: Yes. Denies suicidal/homicidal ideation: Yes. Issues/concerns per patient self-inventory: No.  New problem(s) identified:  No  New Short Term/Long Term Goal(s):    medication stabilization, elimination of SI thoughts, development of comprehensive mental wellness plan.   Patient Goals:  I want to feel safe.  Discharge Plan or Barriers: Patient recently admitted. CSW will continue to follow and assess for appropriate referrals and possible discharge planning.   Reason for Continuation of Hospitalization: Depression Medication stabilization Suicidal ideation  Estimated Length of Stay:  5 - 7 days  Last 3 [place] Suicide Severity Risk Score: Flowsheet Row [facility] Most recent reading at 11/24/2023  1:00 AM ED from 11/23/2023 in [facility] at [facility] Most recent reading at 11/23/2023 11:33 AM UC from 11/10/2023  in [facility] Most recent reading at 11/10/2023  4:48 PM  C-SSRS RISK CATEGORY Error: Question 6 not populated Error: Q3, 4, or 5 should not be populated when Q2 is No Moderate Risk    Last PHQ 2/9 Scores:    04/13/2023    2:25 PM 10/14/2021    8:22 AM  09/29/2021    4:03 AM  Depression screen PHQ 2/9  Decreased Interest 1 1 2   Down, Depressed, Hopeless 3 3 3   PHQ - 2 Score 4 4 5   Altered sleeping 0 2 2  Tired, decreased energy 0 2 2  Change in appetite 0 1 2  Feeling bad or failure about yourself  3 3 3   Trouble concentrating 1 1 2   Moving slowly or fidgety/restless 1 0 1  Suicidal thoughts 2 1 2   PHQ-9 Score 11 14 19   Difficult doing work/chores Very difficult Somewhat difficult Very difficult    Scribe for Treatment Team: Kayde Atkerson O Jakobie Henslee, LCSWA 11/24/2023 4:31 PM

## 2023-11-24 NOTE — [facility] Group Notes (Signed)
 Type of Therapy:  Group Topic/ Focus: Goals Group: The focus of this group is to help patients establish daily goals to achieve during treatment and discuss how the patient can incorporate goal setting into their daily lives to aide in recovery.    Participation Level:  Active   Participation Quality:  Appropriate   Affect:  Appropriate   Cognitive:  Appropriate   Insight:  Appropriate   Engagement in Group:  Engaged   Modes of Intervention:  Discussion   Summary of Progress/Problems:   Patient attended and participated goals group today. No SI/HI.

## 2023-11-24 NOTE — [facility] Suicide Risk Assessment (Signed)
 [facility] Admission Suicide Risk Assessment   Nursing information obtained from:    Demographic factors:  Divorced or widowed, Low socioeconomic status, Living alone, Unemployed Current Mental Status:  Suicidal ideation indicated by patient Loss Factors:  Loss of significant relationship, Financial problems / change in socioeconomic status Historical Factors:  Anniversary of important loss Risk Reduction Factors:  Sense of responsibility to family, Positive coping skills or problem solving skills  Total Time spent with patient: 1 hour Principal Problem: MDD (major depressive disorder), severe (HCC) Diagnosis:  Principal Problem:   MDD (major depressive disorder), severe (HCC)   Subjective Data:  [name] is a 53 y.o., male with history of MDD, 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    Patient reports having recent worsening depression that has resulted in SI for the past 2 weeks.  He reports primarily passive suicidal ideation but occasionally will have thoughts of going back to drinking alcohol  despite having 11 years of sustained remission from alcohol  use disorder.  He reports primarily financial stressors specifically being unable to afford $120 to get his driving license. He reports also relationship stressors related to romantic ones as well as with his daughters. He endorses present passive SI but is able to contract for safety. He reports often ruminating due to wasting time drinking alcohol  for several years.  He endorses symptoms of depression including depressed mood, anhedonia, poor sleep, guilt, hopelessness, crying spells, and passive SI. He endorses struggling with chronic pain in his back which he needs to see PCP   He denies history of mania.  He denies history of psychosis.   He denies any alcohol  use for past 11 years.  He reports smoking 7-10 blunts of cannabis daily to  manage his depression. He denies any other substance use.    Endorses hx of trauma related to fiancee passing away from heart attack as well as loss of multiple family members over the past few years. Denies PTSD symptoms.  Continued Clinical Symptoms:  Alcohol  Use Disorder Identification Test Final Score (AUDIT): 4 The Alcohol  Use Disorders Identification Test, Guidelines for Use in Primary Care, Second Edition.  World Science writer [facility]). Score between 0-7:  no or low risk or alcohol  related problems. Score between 8-15:  moderate risk of alcohol  related problems. Score between 16-19:  high risk of alcohol  related problems. Score 20 or above:  warrants further diagnostic evaluation for alcohol  dependence and treatment.   CLINICAL FACTORS:   Severe Anxiety and/or Agitation Depression:   Severe   Musculoskeletal: Strength & Muscle Tone: within normal limits Gait & Station: normal Patient leans: N/A  Psychiatric Specialty Exam:  Presentation  General Appearance:  Appropriate for Environment   Eye Contact: Good   Speech: Clear and Coherent   Speech Volume: Normal   Handedness: -- (not assessed)   Mood and Affect  Mood: Depressed; Hopeless   Affect: Appropriate    Thought Process  Thought Processes: Coherent   Descriptions of Associations:Intact   Orientation:Full (Time, Place and Person)   Thought Content:Paranoid Ideation; Logical (Paranoid about medications)   History of Schizophrenia/Schizoaffective disorder:No   Duration of Psychotic Symptoms:N/A   Hallucinations:denies  Ideas of Reference:None   Suicidal Thoughts:passive w/o intent  Homicidal Thoughts:denies   Sensorium  Memory: Immediate Good; Recent Good   Judgment: Good   Insight: Good    Executive Functions  Concentration: Good   Attention Span: Good   Recall:  Good   Fund of Knowledge: Good   Language: Good    Psychomotor Activity   Psychomotor Activity:fair   Assets  Assets: Communication Skills; Desire for Improvement; Physical Health; Resilience    Sleep  Sleep:fair    Physical Exam: Physical Exam ROS Blood pressure (!) 123/91, pulse 64, temperature 97.6 F (36.4 C), resp. rate 16, height 5' 7 (1.702 m), weight 52.2 kg, SpO2 99%. Body mass index is 18.01 kg/m.   COGNITIVE FEATURES THAT CONTRIBUTE TO RISK:  None    SUICIDE RISK:   Mild:  Suicidal ideation of limited frequency, intensity, duration, and specificity.  There are no identifiable plans, no associated intent, mild dysphoria and related symptoms, good self-control (both objective and subjective assessment), few other risk factors, and identifiable protective factors, including available and accessible social support.  PLAN OF CARE: see H&P  I certify that inpatient services furnished can reasonably be expected to improve the patient's condition.   Prentice Espy, MD 11/24/2023, 1:07 PM

## 2023-11-24 NOTE — Plan of Care (Signed)
 Patient newly admitted and requires stabilization of signs and symptoms of depressive episode.

## 2023-11-24 NOTE — H&P (Addendum)
 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   Patient reports having recent worsening depression that has resulted in SI for the past 2 weeks.  He reports primarily passive suicidal ideation but occasionally will have thoughts of going back to drinking alcohol  despite having 11 years of sustained remission from alcohol  use disorder.  He reports primarily financial stressors specifically being unable to afford $120 to get his driving license. He reports also relationship stressors related to romantic ones as well as with his daughters. He endorses present passive SI but is able to contract for safety. He reports often ruminating due to wasting time drinking alcohol  for several years.  He endorses symptoms of depression including depressed mood, anhedonia, poor sleep, guilt, hopelessness, crying spells, and passive SI. He endorses struggling with chronic pain in his back which he needs to see PCP  He denies history of mania.  He denies history of psychosis.  He denies any alcohol  use for past 11 years.  He reports smoking 7-10 blunts of cannabis daily to manage his depression. He denies any other substance use.   Endorses hx of trauma related to fiancee passing away from heart attack as well as loss of multiple family members over the past few years. Denies PTSD symptoms.  Reason for Admission:  suicidal or self-harming behaviors, and homicidal behaviors    [name] is a 53 y.o. 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  Patient has 6 prior psychiatric hospitalizations identified on chart review. Patient initially arrived to [facility] on 03/22/23 for SI and worsening depression, and admitted to [facility] voluntarily on 03/23/23 for acute safety concerns, acute suicidal or self-harming behaviors, and homicidal behaviors.      Principal Problem: MDD (major depressive disorder) Discharge Diagnoses: Principal Problem:   MDD (major depressive disorder)     Past Psychiatric Hx: -Recurrent MDD with severe features and possible psychosis (AH/VH/paranoia) requiring at least 6 BHH (most recent 02/18/23) -Cannabis use disorder, current -Tobacco use disorder, current -Hx of Alcohol  use disorder (10 years sober) -No current psychiatrist/therapist -No hx of suicidal or homicidal attempts   Substance Abuse Hx: Alcohol : sober for ~10 years Tobacco: 0.5 ppd Cannabis: marijuana daily (positive on UDS) Other Illicit drugs: denies other illicit drug use Rx drug abuse: None reported  Rehab hx: [facility] for AUD, has been to numerous others most recently and successfully at [facility]    Past Medical History: PCP: unknown  Medical Dx: Tobacco use disorder, substance use (marijuana)  Medications: none reported Allergies: Ibuprofen , Acetaminophen , Asprin, Pork-derived Products, sertraline . Unsure of reaction to each medication but endorses N/V, Hives and possible anaphylaxis? Surgeries: None Trauma: Head injury  Seizures: none identified on chart review     Family Medical History: Father passed away from cirrhosis.   Family Psychiatric History: Medical: None reported. Psych: None reported. Psych Rx: None reported. SA/HA: None reported. Substance use family hx: Not asked.     Social  History: Childhood: unknown Abuse: None reported. Marital  Status: Single Sexual orientation:  Children: 1 living, 1 deceased Employment: Previously worked at group home, Currently home improvement in [facility] Education: unknown Peer Group: Reports having no support Housing: Lives alone Finances: Worried due to losing phone with work contacts and child support Legal: No charges, only child Charity fundraiser: None   Access to firearms: Denies  Is the patient at risk to self? Yes Has the patient been a risk to self in the past 6 months? Yes Has the patient been a risk to self within the distant past? Yes Is the patient a risk to others? No Has the patient been a risk to others in the past 6 months? No Has the patient been a risk to others within the distant past? No  Alcohol  Screening: 1. How often do you have a drink containing alcohol ?: Never 2. How many drinks containing alcohol  do you have on a typical day when you are drinking?: 1 or 2 3. How often do you have six or more drinks on one occasion?: Never AUDIT-C Score: 0 4. How often during the last year have you found that you were not able to stop drinking once you had started?: Never 5. How often during the last year have you failed to do what was normally expected from you because of drinking?: Never 6. How often during the last year have you needed a first drink in the morning to get yourself going after a heavy drinking session?: Never 7. How often during the last year have you had a feeling of guilt of remorse after drinking?: Never 8. How often during the last year have you been unable to remember what happened the night before because you had been drinking?: Never 9. Have you or someone else been injured as a result of your drinking?: Yes, but not in the last year 10. Has a relative or friend or a doctor or another health worker been concerned about your drinking or suggested you cut down?: Yes, but not in the last year Alcohol  Use Disorder Identification Test Final Score  (AUDIT): 4 Alcohol  Brief Interventions/Follow-up: Patient Refused Tobacco Screening:    Substance Abuse History in the last 12 months: Yes  Allergies:  Allergies  Allergen Reactions   Aspirin Hives and Nausea And Vomiting   Ibuprofen  Hives and Nausea And Vomiting   Pork-Derived Products Other (See Comments)    Stomach pain. Pt does not eat pork   Acetaminophen  Nausea Only   Sertraline  Diarrhea and Other (See Comments)    Acute GI distress with diarrhea and pain.        Family History:  Family History  Problem Relation Age of Onset   Hypertension Mother    Hypertension Father    Hypertension Other     Lab Results:  Results for orders placed or performed during the hospital encounter of 11/23/23 (from the past 48 hours)  Comprehensive metabolic panel     Status: Abnormal   Collection Time: 11/23/23 11:33 AM  Result Value Ref Range   Sodium 145 135 - 145 mmol/L   Potassium 4.2 3.5 - 5.1 mmol/L   Chloride 106 98 - 111 mmol/L   CO2 26 22 - 32 mmol/L   Glucose, Bld 111 (H) 70 - 99 mg/dL    Comment: Glucose reference range applies only to samples taken after fasting for at least 8 hours.   BUN 11 6 - 20 mg/dL   Creatinine, Ser 8.99 0.61 -  1.24 mg/dL   Calcium 9.9 8.9 - 89.6 mg/dL   Total Protein 7.4 6.5 - 8.1 g/dL   Albumin 4.5 3.5 - 5.0 g/dL   AST 23 15 - 41 U/L   ALT 12 0 - 44 U/L   Alkaline Phosphatase 35 (L) 38 - 126 U/L   Total Bilirubin 0.9 0.0 - 1.2 mg/dL   GFR, Estimated >39 >39 mL/min    Comment: (NOTE) Calculated using the CKD-EPI Creatinine Equation (2021)    Anion gap 13 5 - 15    Comment: Performed at [facility], [address], [place], [place] [address]  cbc     Status: Abnormal   Collection Time: 11/23/23 11:33 AM  Result Value Ref Range   WBC 6.4 4.0 - 10.5 K/uL   RBC 4.11 (L) 4.22 - 5.81 MIL/uL   Hemoglobin 12.8 (L) 13.0 - 17.0 g/dL   HCT 60.2 60.9 - 47.9 %   MCV 96.6 80.0 - 100.0 fL   MCH 31.1 26.0 - 34.0 pg   MCHC 32.2 30.0 - 36.0  g/dL   RDW 86.7 88.4 - 84.4 %   Platelets 255 150 - 400 K/uL   nRBC 0.0 0.0 - 0.2 %    Comment: Performed at [facility], [address], [place], [place] [address]  Rapid urine drug screen (hospital performed)     Status: Abnormal   Collection Time: 11/23/23 11:33 AM  Result Value Ref Range   Opiates NONE DETECTED NONE DETECTED   Cocaine NONE DETECTED NONE DETECTED   Benzodiazepines NONE DETECTED NONE DETECTED   Amphetamines NONE DETECTED NONE DETECTED   Tetrahydrocannabinol POSITIVE (A) NONE DETECTED   Barbiturates NONE DETECTED NONE DETECTED    Comment: (NOTE) DRUG SCREEN FOR MEDICAL PURPOSES ONLY.  IF CONFIRMATION IS NEEDED FOR ANY PURPOSE, NOTIFY LAB WITHIN 5 DAYS.  LOWEST DETECTABLE LIMITS FOR URINE DRUG SCREEN Drug Class                     Cutoff (ng/mL) Amphetamine and metabolites    1000 Barbiturate and metabolites    200 Benzodiazepine                 200 Opiates and metabolites        300 Cocaine and metabolites        300 THC                            50 Performed at [facility], [address], [place], [place] [address]   Ethanol     Status: None   Collection Time: 11/23/23 12:00 PM  Result Value Ref Range   Alcohol , Ethyl (B) <15 <15 mg/dL    Comment: (NOTE) For medical purposes only. Performed at [facility], [address], [place], [place] [address]     Blood Alcohol  level:  Lab Results  Component Value Date   [facility] <15 11/23/2023   ETH <10 06/29/2023    Metabolic Disorder Labs:  Lab Results  Component Value Date   HGBA1C 5.2 08/27/2014   MPG 103 08/27/2014   MPG 105 03/08/2013   No results found for: PROLACTIN No results found for: CHOL, TRIG, HDL, CHOLHDL, VLDL, LDLCALC  Current Medications: Current Facility-Administered Medications  Medication Dose Route Frequency Provider Last Rate Last Admin   alum & mag hydroxide-simeth (MAALOX/MYLANTA) 200-200-20 MG/5ML suspension 30 mL  30 mL Oral Q4H PRN  Mardy Elveria DEL, NP       haloperidol  (HALDOL ) tablet 5 mg  5 mg Oral TID PRN Mardy Elveria DEL, NP       And   diphenhydrAMINE  (BENADRYL ) capsule 50 mg  50 mg Oral TID PRN Mardy Elveria DEL, NP       haloperidol  lactate (HALDOL ) injection 5 mg  5 mg Intramuscular TID PRN Mardy Elveria DEL, NP       And   diphenhydrAMINE  (BENADRYL ) injection 50 mg  50 mg Intramuscular TID PRN Mardy Elveria DEL, NP       And   LORazepam  (ATIVAN ) injection 2 mg  2 mg Intramuscular TID PRN Coleman, Carolyn H, NP       haloperidol  lactate (HALDOL ) injection 10 mg  10 mg Intramuscular TID PRN Mardy Elveria DEL, NP       And   diphenhydrAMINE  (BENADRYL ) injection 50 mg  50 mg Intramuscular TID PRN Mardy Elveria DEL, NP       And   LORazepam  (ATIVAN ) injection 2 mg  2 mg Intramuscular TID PRN Coleman, Carolyn H, NP       DULoxetine  (CYMBALTA ) DR capsule 30 mg  30 mg Oral Daily Coleman, Carolyn H, NP       hydrOXYzine  (ATARAX ) tablet 25 mg  25 mg Oral TID PRN Coleman, Carolyn H, NP       magnesium  hydroxide (MILK OF MAGNESIA) suspension 30 mL  30 mL Oral Daily PRN Coleman, Carolyn H, NP       traZODone  (DESYREL ) tablet 50 mg  50 mg Oral QHS PRN Coleman, Carolyn H, NP        PTA Medications: Medications Prior to Admission  Medication Sig Dispense Refill Last Dose/Taking   baclofen  (LIORESAL ) 10 MG tablet Take 1 tablet (10 mg total) by mouth 3 (three) times daily. (Patient not taking: Reported on 11/23/2023) 30 each 0    DULoxetine  (CYMBALTA ) 30 MG capsule Take 1 capsule (30 mg total) by mouth daily. (Patient not taking: Reported on 11/10/2023) 30 capsule 0     Physical Findings: AIMS: No  CIWA:    COWS:     Psychiatric Specialty Exam: General Appearance:  Appropriate for Environment   Eye Contact:  Good   Speech:  Clear and Coherent   Volume:  Normal   Mood:  Depressed; Hopeless   Affect:  Appropriate   Thought Content:  Paranoid Ideation; Logical (Paranoid about medications)    Suicidal Thoughts: passive, w/o intent or plan  Homicidal Thoughts: denies  Thought Process:  Coherent   Orientation:  Full (Time, Place and Person)     Memory:  Immediate Good; Recent Good   Judgment:  Good   Insight:  Good   Concentration:  Good   Recall:  Good   Fund of Knowledge:  Good   Language:  Good   Psychomotor Activity: fair  Assets:  Communication Skills; Desire for Improvement; Physical Health; Resilience   Sleep: No data recorded   Review of Systems Review of Systems  Respiratory:  Negative for shortness of breath.   Cardiovascular:  Negative for chest pain.  Gastrointestinal:  Negative for abdominal pain, constipation, diarrhea, heartburn, nausea and vomiting.  Neurological:  Negative for headaches.    Vital signs: Blood pressure 125/79, pulse (!) 58, temperature 97.6 F (36.4 C), resp. rate 16, height 5' 7 (1.702 m), weight 52.2 kg, SpO2 99%. Body mass index is 18.01 kg/m. Physical Exam Constitutional:      Appearance: Normal appearance.  HENT:  Head: Normocephalic and atraumatic.  Eyes:     Extraocular Movements: Extraocular movements intact.     Conjunctiva/sclera: Conjunctivae normal.     Pupils: Pupils are equal, round, and reactive to light.  Cardiovascular:     Rate and Rhythm: Normal rate.     Pulses: Normal pulses.  Pulmonary:     Effort: Pulmonary effort is normal.  Musculoskeletal:        General: Normal range of motion.     Cervical back: Normal range of motion.  Skin:    General: Skin is warm and dry.  Neurological:     General: No focal deficit present.     Mental Status: He is alert and oriented to person, place, and time. Mental status is at baseline.     Assets  Assets:Communication Skills; Desire for Improvement; Physical Health; Resilience   Treatment Plan Summary: Daily contact with patient to assess and evaluate symptoms and progress in treatment and medication management  ASSESSMENT: Patient  with underlying recurrent episode of MDD that is likely worsened by the chronic severe cannabis use disorder. SI appears to be more recent in past 1-2 weeks which may be an indication for worsening MDD. Continuing patient's home cymbalta  and starting patient on naltrexone  to aid with the alcohol  use disorder and off-label for cannabis use disorder. Patient complains of back pain and X-ray shows mild degeneration. Will have prn lidocaine  patch to aid with back pain. Patient needs PCP although it appears he saw [facility].    PLAN: Safety and Monitoring:  -- Voluntary admission to inpatient psychiatric unit for safety, stabilization and treatment  -- Daily contact with patient to assess and evaluate symptoms and progress in treatment  -- Patient's case to be discussed in multi-disciplinary team meeting  -- Observation Level : q15 minute checks  -- Vital signs: q12 hours  -- Precautions: suicide, elopement, and assault  2. Psychiatric Problems Major Depressive Disorder, recurrent episode w/o psychotic features Substance induced mood disorder Cannabis Use Disorder Alcohol  Use Disorder -restart home duloxetine  30 mg -start naltrexone  50 mg daily -PRNs; hydroxyzine , trazodone , maalox, milk of magnesia, ondansetron  -- As needed agitation protocol in-place  The risks/benefits/side-effects/alternatives to the above medication were discussed in detail with the patient and time was given for questions. The patient consents to medication trial. FDA black box warnings, if present, were discussed.  The patient is agreeable with the medication plan, as above. We will monitor the patient's response to pharmacologic treatment, and adjust medications as necessary.  3. Medical Problems #Back Pain -lidocaine  patch  4. Routine and other pertinent labs:  Metabolism / endocrine: BMI: Body mass index is 18.01 kg/m. Prolactin: No results found for: PROLACTIN Lipid Panel: No results found for:  CHOL, TRIG, HDL, CHOLHDL, VLDL, LDLCALC HbgA1c: Hgb A1c MFr Bld (%)  Date Value  08/27/2014 5.2   TSH: TSH (uIU/mL)  Date Value  10/01/2021 1.715    5. Group Therapy:  -- Encouraged patient to participate in unit milieu and in scheduled group therapies   -- Short Term Goals: Ability to identify changes in lifestyle to reduce recurrence of condition, verbalize feelings, identify and develop effective coping behaviors, maintain clinical measurements within normal limits, and identify triggers associated with substance abuse/mental health issues will improve. Improvement in ability to demonstrate self-control and comply with prescribed medications.  -- Long Term Goals: Improvement in symptoms so as ready for discharge -- Patient is encouraged to participate in group therapy while admitted to the psychiatric unit. -- We  will address other chronic and acute stressors, which contributed to the patient's MDD (major depressive disorder), severe (HCC) in order to reduce the risk of self-harm at discharge.  6. Discharge Planning:   -- Social work and case management to assist with discharge planning and identification of hospital follow-up needs prior to discharge  -- Estimated LOS: 5-7 days  -- Discharge Concerns: Need to establish a safety plan; Medication compliance and effectiveness  -- Discharge Goals: Return home with outpatient referrals for mental health follow-up including medication management/psychotherapy  I certify that inpatient services furnished can reasonably be expected to improve the patient's condition.   Signed: Prentice Espy, MD 11/24/2023, 7:44 AM

## 2023-11-24 NOTE — Tx Team (Signed)
 Initial Treatment Plan 11/24/2023 5:16 AM Douglass Mower FMW:[id number]    PATIENT STRESSORS: Financial difficulties   Loss of significant others     PATIENT STRENGTHS: Average or above average intelligence  Supportive family/friends    PATIENT IDENTIFIED PROBLEMS: Depression  Financial Issues                   DISCHARGE CRITERIA:  Improved stabilization in mood, thinking, and/or behavior Verbal commitment to aftercare and medication compliance  PRELIMINARY DISCHARGE PLAN: Return to previous living arrangement  PATIENT/FAMILY INVOLVEMENT: This treatment plan has been presented to and reviewed with the patient, [name], and/or family member,  The patient and family have been given the opportunity to ask questions and make suggestions.  [name] [name] Burrs, RN 11/24/2023, 5:16 AM

## 2023-11-24 NOTE — Group Note (Signed)
 Recreation Therapy Group Note   Group Topic:Other  Group Date: 11/24/2023 Start Time: 1405 End Time: 1450 Facilitators: Zeplin Aleshire-McCall, LRT,CTRS Location: [address] Dayroom   Activity Description/Intervention: Therapeutic Drumming. Patients with peers and staff were given the opportunity to engage in a leader facilitated [employer] Group Empowerment Drumming Circle with staff from the [employer], in partnership with [employer]. Teaching laboratory technician and trained [employer], Norleen Mon leading with LRT observing and documenting intervention and pt response. This evidenced-based practice targets 7 areas of health and wellbeing in the human experience including: stress-reduction, exercise, self-expression, camaraderie/support, nurturing, spirituality, and music-making (leisure).   Goal Area(s) Addresses:  Patient will engage in pro-social way in music group.  Patient will follow directions of drum leader on the first prompt. Patient will demonstrate no behavioral issues during group.  Patient will identify if a reduction in stress level occurs as a result of participation in therapeutic drum circle.    Education: Leisure exposure, Pharmacologist, Musical expression, Discharge Planning   Affect/Mood: N/A   Participation Level: Did not attend    Clinical Observations/Individualized Feedback:      Plan: Continue to engage patient in RT group sessions 2-3x/week.   Trystan Eads-McCall, LRT,CTRS 11/24/2023 3:31 PM
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
## 2023-11-24 NOTE — BHH Group Notes (Signed)

## 2023-11-24 NOTE — Progress Notes (Signed)
   11/24/23 0930  Psych Admission Type (Psych Patients Only)  Admission Status Voluntary  Psychosocial Assessment  Patient Complaints Anxiety;Depression;Irritability  Eye Contact Fair  Facial Expression Flat  Affect Anxious;Depressed;Irritable  Speech Logical/coherent  Interaction Assertive  Motor Activity Other (Comment) (WNL)  Appearance/Hygiene In scrubs  Behavior Characteristics Cooperative;Irritable  Mood Depressed;Anxious;Irritable  Thought Process  Coherency WDL  Content WDL  Delusions None reported or observed  Perception WDL  Hallucination None reported or observed  Judgment Poor  Confusion None  Danger to Self  Current suicidal ideation? Denies  Danger to Others  Danger to Others None reported or observed

## 2023-11-24 NOTE — Plan of Care (Signed)
  Problem: Activity: Goal: Sleeping patterns will improve Outcome: Progressing   Problem: Coping: Goal: Ability to demonstrate self-control will improve Outcome: Progressing   Problem: Safety: Goal: Ability to disclose and discuss suicidal ideas will improve Outcome: Progressing Goal: Ability to identify and utilize support systems that promote safety will improve Outcome: Progressing

## 2023-11-25 MED ORDER — NICOTINE 14 MG/24HR TD PT24
14.0000 mg | MEDICATED_PATCH | Freq: Every day | TRANSDERMAL | Status: DC
  Administered 2023-11-25 – 2023-11-28 (×4): 14 mg via TRANSDERMAL
  Filled 2023-11-25 (×4): qty 1

## 2023-11-25 NOTE — Progress Notes (Signed)
(  Sleep Hours) - 5.25 hours as of 0530 (Any PRNs that were needed, meds refused, or side effects to meds)- No PRN meds given and no meds refused.  (Any disturbances and when (visitation, over night)- None  (Concerns raised by the patient)- No concerns raised by the pt.  (SI/HI/AVH)- Denies SI/HI/AVH at this time.

## 2023-11-25 NOTE — Plan of Care (Signed)

## 2023-11-25 NOTE — Progress Notes (Addendum)
 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   ASSESSMENT: Patient reports continued depressive symptoms and largely unchanged mood today. Attending groups and participating in milieu. Denies SI today. Given cold sensitivity and ongoing depression, ordering TSH, vitamin D, and iron panel to rule out other etiologies of depression.   PLAN: #Major Depressive Disorder, recurrent episode w/o psychotic features #Substance induced mood disorder #Cannabis Use Disorder #Alcohol  Use Disorder in sustained remission -continue home duloxetine  30 mg -continue naltrexone  25 mg qhs -nicotine  patch and gum for NRT -PRNs; hydroxyzine , trazodone , maalox, milk of magnesia, ondansetron  -- As needed agitation protocol in-place -- labs: iron and tibc, tsh, reticulocyte, ferritin  #Back Pain -lidocaine  patch  Disposition Planning: -- Estimated LOS: 3-5 days --Estimated Discharge Date 7/27 --Barriers to Discharge: ongoing severe depression -- Discharge Goals: Return home with outpatient referrals for mental health follow-up including medication management/psychotherapy  Subjective: The patient was seen and evaluated on the unit. On assessment today the patient reports doing ok. Continues to feel depressed and ruminative about having many things I'm thinking about. Denies SI/HI/AVH.  Discussed evaluating for tolerability of naltrexone  and why it was started at 25 mg based on past allergic reaction of nausea with aspirin. He inquired whether or not there was aspirin in naltrexone . I told him there was not.    Objective: Chart Review from last 24 hours:  The patient's chart was reviewed and nursing notes were reviewed. The patient's case was discussed in multidisciplinary team meeting.  - Overnight events to report per chart review / staff report: none - Patient took all prescribed medications yes - Patient received the following PRNs: none  Current Medications: Current Facility-Administered Medications  Medication Dose Route Frequency Provider Last Rate Last Admin   alum & mag hydroxide-simeth (MAALOX/MYLANTA) 200-200-20 MG/5ML suspension 30 mL  30 mL Oral Q4H PRN Coleman, Carolyn H, NP       haloperidol  (HALDOL ) tablet 5 mg  5 mg Oral TID PRN Mardy Elveria DEL, NP       And   diphenhydrAMINE  (BENADRYL ) capsule 50 mg  50 mg Oral TID PRN Mardy Elveria DEL, NP       haloperidol  lactate (HALDOL ) injection 5 mg  5 mg Intramuscular TID PRN Mardy Elveria DEL, NP       And   diphenhydrAMINE  (BENADRYL ) injection 50 mg  50 mg Intramuscular TID PRN Mardy Elveria DEL, NP       And   LORazepam  (ATIVAN ) injection 2 mg  2 mg Intramuscular TID PRN Mardy Elveria DEL, NP       haloperidol  lactate (HALDOL ) injection 10 mg  10 mg Intramuscular TID PRN Mardy Elveria DEL, NP       And   diphenhydrAMINE  (BENADRYL ) injection 50 mg  50 mg Intramuscular TID PRN Mardy Elveria DEL, NP       And   LORazepam  (ATIVAN ) injection 2 mg  2 mg Intramuscular TID PRN Mardy,  Elveria DEL, NP       DULoxetine  (CYMBALTA ) DR capsule 30 mg  30 mg Oral Daily Coleman, Carolyn H, NP   30 mg at 11/25/23 0819   hydrOXYzine  (ATARAX ) tablet 25 mg  25 mg Oral TID PRN Coleman, Carolyn H, NP       magnesium  hydroxide (MILK OF MAGNESIA) suspension 30 mL  30 mL Oral Daily PRN Coleman, Carolyn H, NP        naltrexone  (DEPADE) tablet 25 mg  25 mg Oral QHS Julieth Tugman, May   25 mg at 11/24/23 2118   ondansetron  (ZOFRAN ) tablet 4 mg  4 mg Oral Q8H PRN Lynnette Barter, May       traZODone  (DESYREL ) tablet 50 mg  50 mg Oral QHS PRN Mardy Elveria DEL, NP        Lab Results:  Results for orders placed or performed during the hospital encounter of 11/23/23 (from the past 48 hours)  Comprehensive metabolic panel     Status: Abnormal   Collection Time: 11/23/23 11:33 AM  Result Value Ref Range   Sodium 145 135 - 145 mmol/L   Potassium 4.2 3.5 - 5.1 mmol/L   Chloride 106 98 - 111 mmol/L   CO2 26 22 - 32 mmol/L   Glucose, Bld 111 (H) 70 - 99 mg/dL    Comment: Glucose reference range applies only to samples taken after fasting for at least 8 hours.   BUN 11 6 - 20 mg/dL   Creatinine, Ser 8.99 0.61 - 1.24 mg/dL   Calcium 9.9 8.9 - 89.6 mg/dL   Total Protein 7.4 6.5 - 8.1 g/dL   Albumin 4.5 3.5 - 5.0 g/dL   AST 23 15 - 41 U/L   ALT 12 0 - 44 U/L   Alkaline Phosphatase 35 (L) 38 - 126 U/L   Total Bilirubin 0.9 0.0 - 1.2 mg/dL   GFR, Estimated >39 >39 mL/min    Comment: (NOTE) Calculated using the CKD-EPI Creatinine Equation (2021)    Anion gap 13 5 - 15    Comment: Performed at [facility], [address] [place], [place] [address]  cbc     Status: Abnormal   Collection Time: 11/23/23 11:33 AM  Result Value Ref Range   WBC 6.4 4.0 - 10.5 K/uL   RBC 4.11 (L) 4.22 - 5.81 MIL/uL   Hemoglobin 12.8 (L) 13.0 - 17.0 g/dL   HCT 60.2 60.9 - 47.9 %   MCV 96.6 80.0 - 100.0 fL   MCH 31.1 26.0 - 34.0 pg   MCHC 32.2 30.0 - 36.0 g/dL   RDW 86.7 88.4 - 84.4 %   Platelets 255 150 - 400 K/uL   nRBC 0.0 0.0 - 0.2 %    Comment: Performed at [facility], [address] [place], [place] [address]  Rapid urine drug screen (hospital performed)     Status: Abnormal   Collection Time: 11/23/23 11:33 AM  Result Value Ref Range   Opiates NONE DETECTED NONE DETECTED   Cocaine NONE DETECTED NONE DETECTED    Benzodiazepines NONE DETECTED NONE DETECTED   Amphetamines NONE DETECTED NONE DETECTED   Tetrahydrocannabinol POSITIVE (A) NONE DETECTED   Barbiturates NONE DETECTED NONE DETECTED    Comment: (NOTE) DRUG SCREEN FOR MEDICAL PURPOSES ONLY.  IF CONFIRMATION IS NEEDED FOR ANY PURPOSE, NOTIFY LAB WITHIN 5 DAYS.  LOWEST DETECTABLE LIMITS FOR URINE DRUG SCREEN Drug Class  Cutoff (ng/mL) Amphetamine and metabolites    1000 Barbiturate and metabolites    200 Benzodiazepine                 200 Opiates and metabolites        300 Cocaine and metabolites        300 THC                            50 Performed at [facility], [address] [place], [place] [address]   Ethanol     Status: None   Collection Time: 11/23/23 12:00 PM  Result Value Ref Range   Alcohol , Ethyl (B) <15 <15 mg/dL    Comment: (NOTE) For medical purposes only. Performed at [facility], [address], [place], [place] [address]     Blood Alcohol  level:  Lab Results  Component Value Date   [facility] <15 11/23/2023   ETH <10 06/29/2023    Metabolic Labs: Lab Results  Component Value Date   HGBA1C 5.2 08/27/2014   MPG 103 08/27/2014   MPG 105 03/08/2013   No results found for: PROLACTIN No results found for: CHOL, TRIG, HDL, CHOLHDL, VLDL, LDLCALC  Physical Findings:   Psychiatric Specialty Exam: General Appearance: Appropriate for Environment   Eye Contact: Good   Speech: Clear and Coherent   Volume: Normal   Mood: Anxious; Depressed   Affect: Appropriate; Congruent   Thought Content: Rumination   Suicidal Thoughts: denies  Homicidal Thoughts: denies  Thought Process: Coherent   Orientation: Full (Time, Place and Person)     Memory: Recent Fair; Remote Fair   Judgment: Fair   Insight: Fair   Concentration: Fair   Recall: Fair   Fund of Knowledge: Fair   Language: Fair   Psychomotor Activity: normal  Assets: Investment banker, corporate; Desire for Improvement; Physical Health   Sleep: Sleep: Good    Review of Systems ROS  Vital Signs: Blood pressure (!) 134/90, pulse 61, temperature 97.6 F (36.4 C), resp. rate 16, height 5' 7 (1.702 m), weight 52.2 kg, SpO2 100%. Body mass index is 18.01 kg/m. Physical Exam  I certify that inpatient services furnished can reasonably be expected to improve the patient's condition.   Signed: Prentice Espy, May 11/25/2023, 10:45 AM

## 2023-11-25 NOTE — Progress Notes (Signed)
 D: Patient is alert, oriented, and cooperative. Denies SI, HI, AVH, and verbally contracts for safety. Patient reports he slept good last night without sleeping medication. Patient reports his appetite as good, energy level as normal, and concentration as good. Patient rates his depression 0/10, hopelessness 0/10, and anxiety 0/10. Patient reports he is withdrawing from marijuana.    A: Scheduled medications administered per MD order. Support provided. Patient educated on safety on the unit and medications. Routine safety checks every 15 minutes. Patient stated understanding to tell nurse about any new physical symptoms. Patient understands to tell staff of any needs.     R: No adverse drug reactions noted. Patient remains safe at this time and will continue to monitor.    11/25/23 1200  Psych Admission Type (Psych Patients Only)  Admission Status Voluntary  Psychosocial Assessment  Patient Complaints Anxiety  Eye Contact Fair  Facial Expression Flat  Affect Anxious;Depressed;Irritable  Speech Logical/coherent  Interaction Assertive  Motor Activity Other (Comment) (WNL)  Appearance/Hygiene Unremarkable  Behavior Characteristics Cooperative  Mood Depressed;Anxious;Irritable  Thought Process  Coherency WDL  Content WDL  Delusions None reported or observed  Perception WDL  Hallucination None reported or observed  Judgment Poor  Confusion None  Danger to Self  Current suicidal ideation? Denies  Agreement Not to Harm Self Yes  Description of Agreement verbal  Danger to Others  Danger to Others None reported or observed

## 2023-11-25 NOTE — Plan of Care (Signed)
   Problem: Education: Goal: Emotional status will improve Outcome: Progressing Goal: Mental status will improve Outcome: Progressing Goal: Verbalization of understanding the information provided will improve Outcome: Progressing   Problem: Activity: Goal: Interest or engagement in activities will improve Outcome: Progressing

## 2023-11-25 NOTE — Group Note (Signed)
 LCSW Group Therapy Note   Group Date: 11/25/2023 Start Time: 1100 End Time: 1200   Participation:  patient was present and actively participated in the discussion.  Type of Therapy:  Group Therapy  Topic:  Understanding Your Path to Change  Objective:  The goal is to help individuals understand the stages of change, identify where they currently are in the process, and provide actionable next steps to continue moving forward in their journey of change.  Goals: Learn about the six stages of change:  Precontemplation, Contemplation, Preparation, Action, Maintenance, and Relapse Reflect on Current Change Efforts:  Recognize which stage participants are in regarding a personal change. Plan Next Steps for Moving Forward:  Create an action plan based on their current stage of change.  Class Summary: In this session, we explored the Stages of Change as a framework to understand the process of change.  We discussed how each stage helps individuals recognize where they are in their personal journey and used the Stages of Change Worksheet for self-reflection. Participants answered questions to better understand their current stage, challenges, and progress. We also emphasized the importance of moving forward, even if setbacks (Relapse) occur, and created actionable steps to help participants continue progressing. By the end of the session, participants gained a clearer understanding of their path to change and left with a clear plan for next steps.  Modalities:  Elements of CBT (cognitive restructuring, problem solving)  Element of DBT (mindfulness, distress tolerance)   Rillie Riffel O Casee Knepp, LCSWA 11/25/2023  4:29 PM

## 2023-11-25 NOTE — BHH Group Notes (Signed)
 Group Topic/Focus:  Goals Group:   The focus of this group is to help patients establish daily goals to achieve during treatment and discuss how the patient can incorporate goal setting into their daily lives to aide in recovery.       Participation Level:  Active   Participation Quality:  Attentive   Affect:  Appropriate   Cognitive:  Appropriate   Insight: Appropriate   Engagement in Group:  Engaged   Modes of Intervention:  Discussion   Additional Comments:   Patient attended goals group and was attentive the duration of it. Patient's goal was to stay prayed up.

## 2023-11-25 NOTE — BHH Group Notes (Signed)
 Adult Psychoeducational Group Note  Date:  11/25/2023 Time:  8:48 PM  Group Topic/Focus:  Wrap-Up Group:   The focus of this group is to help patients review their daily goal of treatment and discuss progress on daily workbooks.  Participation Level:  Active  Participation Quality:  Appropriate  Affect:  Appropriate  Cognitive:  Appropriate  Insight: Appropriate  Engagement in Group:  Engaged  Modes of Intervention:  Discussion  Additional Comments:  Pt. Attended group.  Drue Pouch 11/25/2023, 8:48 PM

## 2023-11-26 LAB — RETICULOCYTES
Immature Retic Fract: 4 % (ref 2.3–15.9)
RBC.: 4.47 MIL/uL (ref 4.22–5.81)
Retic Count, Absolute: 47.4 K/uL (ref 19.0–186.0)
Retic Ct Pct: 1.1 % (ref 0.4–3.1)

## 2023-11-26 LAB — FERRITIN: Ferritin: 168 ng/mL (ref 24–336)

## 2023-11-26 LAB — VITAMIN D 25 HYDROXY (VIT D DEFICIENCY, FRACTURES): Vit D, 25-Hydroxy: 18.07 ng/mL — ABNORMAL LOW (ref 30–100)

## 2023-11-26 LAB — IRON AND TIBC
Iron: 126 ug/dL (ref 45–182)
Saturation Ratios: 40 % — ABNORMAL HIGH (ref 17.9–39.5)
TIBC: 314 ug/dL (ref 250–450)
UIBC: 188 ug/dL

## 2023-11-26 LAB — TSH: TSH: 1.151 u[IU]/mL (ref 0.350–4.500)

## 2023-11-26 NOTE — Group Note (Signed)
 Recreation Therapy Group Note   Group Topic:Leisure Education  Group Date: 11/26/2023 Start Time: 0934 End Time: 1013 Facilitators: Jewell Haught-McCall, LRT,CTRS Location: [address] Day[address]   Group Topic: Leisure Education  Goal Area(s) Addresses:  Patient will identify positive leisure activities.  Patient will identify one positive benefit of participation in leisure activities.   Behavioral Response: Engaged  Intervention: Leisure Group Game  Activity: Patients and LRT participated in playing a trivia game of Guess the [place]. Patients were divided into 2 group. Patients spin the spinner and whatever category ([address] Dance, [place], [place], Hip-Hop and R&B) they land on LRT reads the lyrics. If the team guesses the missing quote, they get the card, if not the other team gets a chance to steal the point.   Education:  Leisure Exposure, Pharmacist, community, Discharge Planning  Education Outcome: Acknowledges education/In group clarification offered/Needs additional education   Affect/Mood: Appropriate   Participation Level: Engaged   Participation Quality: Independent   Behavior: Appropriate   Speech/Thought Process: Focused   Insight: Good   Judgement: Good   Modes of Intervention: Competitive Play   Patient Response to Interventions:  Engaged   Education Outcome:  In group clarification offered    Clinical Observations/Individualized Feedback: Pt attended group session. Pt was slow to get engaged with the activity. Pt got more involved as group went on and eventually became fully involved with the activity. Pt was social and interactive with peers during group. Pt also worked with peers to determine the answers to the questions.      Plan: Continue to engage patient in RT group sessions 2-3x/week.   Nixon Sparr-McCall, LRT,CTRS 11/26/2023 12:39 PM

## 2023-11-26 NOTE — Progress Notes (Signed)
 D: Patient is alert, oriented, pleasant, and cooperative. Denies SI, HI, AVH, and verbally contracts for safety. Patient reports he slept good last night without sleeping medication. Patient reports his appetite as good, energy level as normal, and concentration as good. Patient rates his depression 0/10, hopelessness 0/10, and anxiety 0/10. Patient reports back pain but declines any intervention. Patient reports marijuana cravings. Patient was a little suspicious about taking his medication this morning.    A: Scheduled medications administered per MD order. Support provided. Patient educated on safety on the unit and medications. Routine safety checks every 15 minutes. Patient stated understanding to tell nurse about any new physical symptoms. Patient understands to tell staff of any needs.     R: No adverse drug reactions noted. Patient remains safe at this time and will continue to monitor.    11/26/23 0900  Psych Admission Type (Psych Patients Only)  Admission Status Voluntary  Psychosocial Assessment  Patient Complaints Depression  Eye Contact Fair  Facial Expression Flat  Affect Anxious;Depressed  Speech Logical/coherent  Interaction Assertive  Motor Activity Other (Comment) (WNL)  Appearance/Hygiene Unremarkable  Behavior Characteristics Cooperative;Calm  Mood Depressed;Pleasant  Thought Process  Coherency WDL  Content WDL  Delusions None reported or observed  Perception WDL  Hallucination None reported or observed  Judgment Poor  Confusion None  Danger to Self  Current suicidal ideation? Denies  Agreement Not to Harm Self Yes  Description of Agreement verbal  Danger to Others  Danger to Others None reported or observed

## 2023-11-26 NOTE — Progress Notes (Signed)
 [facility] MD Progress Note  11/26/2023 10:56 AM [name]  MRN:  [id number] Subjective:   [name] is a 53 y.o. who presented to [facility] voluntarily for SI secondary to worsening depression and was admitted to [facility] for further evaluation (admitted on 7/22, total LOS: 3 days). 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   Case was discussed in the multidisciplinary team. MAR was reviewed and patient is compliant with medications. No PRNs needed.    Psychiatric Team made the following recommendations yesterday: -Ordered TSH, vitamin D, and iron panel to rule out other etiologies of depression -Continue home duloxetine  30 mg -Continue naltrexone  25 mg nightly   On interview today patient reports he slept good last night.  He reports his appetite is doing good.  He reports he is feeling better and is ready for discharge.  Patient patient states he woke up today with a significantly better mood, stating I feel much better-I believe the medications are working because I am no longer having severe cravings for pot.  Patient still endorses cold sensitivity.  Patient denies HI, SI, and AVH.  Patient reports no issues with his medications.  He inquired about a possible discharge date, stating he needs to get back to work and he has things to do.  Principal Problem: MDD (major depressive disorder), recurrent severe, without psychosis (HCC) Diagnosis: Principal Problem:   MDD (major depressive disorder), recurrent severe, without psychosis (HCC) Active Problems:   Alcohol  use disorder, severe, in sustained remission (HCC)   Cannabis use disorder, severe, dependence (HCC)  Total Time spent with patient: 45 minutes  Past Psychiatric History:  -Recurrent MDD with severe features and possible psychosis (AH/VH/paranoia) requiring at least 6 BHH (most recent 02/18/23) -Cannabis use disorder, current -Tobacco use disorder,  current -Hx of Alcohol  use disorder (10 years sober) -No current psychiatrist/therapist -No hx of suicidal or homicidal attempts  Substance Abuse Hx: Alcohol : sober for ~10 years Tobacco: 0.5 ppd Cannabis: marijuana daily (positive on UDS) Other Illicit drugs: denies other illicit drug use Rx drug abuse: None reported  Rehab hx: [facility] for AUD, has been to numerous others most recently and successfully at [facility]   Past Medical History:  Past Medical History:  Diagnosis Date   Alcohol  abuse    Anemia    Colitis    Severe major depression with psychotic features (HCC)    History reviewed. No pertinent surgical history. Family History:  Family History  Problem Relation Age of Onset   Hypertension Mother    Hypertension Father    Hypertension Other    Family Psychiatric  History:  Medical: None reported. Psych: None reported. Psych Rx: None reported. SA/HA: None reported. Substance use family hx: Not asked.  Social History:  Social History   Substance and Sexual Activity  Alcohol  Use No   Comment: former  last use 5+ year ago     Social History   Substance and Sexual Activity  Drug Use Yes   Types: Marijuana    Social History   Socioeconomic History   Marital status: Single    Spouse name: Not on file   Number of children: Not on file   Years of education: Not on file   Highest education level: Not on file  Occupational History   Not on file  Tobacco Use   Smoking status: Every Day    Types: Cigarettes   Smokeless tobacco: Never  Vaping Use  Vaping status: Never Used  Substance and Sexual Activity   Alcohol  use: No    Comment: former  last use 5+ year ago   Drug use: Yes    Types: Marijuana   Sexual activity: Yes    Birth control/protection: Condom  Other Topics Concern   Not on file  Social History Narrative   Not on file   Social Drivers of Health   Financial Resource Strain: High Risk (03/19/2019)   Overall Financial Resource Strain  (CARDIA)    Difficulty of Paying Living Expenses: Very hard  Food Insecurity: Food Insecurity Present (11/24/2023)   Hunger Vital Sign    Worried About Running Out of Food in the Last Year: Often true    Ran Out of Food in the Last Year: Often true  Transportation Needs: Unmet Transportation Needs (11/24/2023)   PRAPARE - Administrator, Civil Service (Medical): Yes    Lack of Transportation (Non-Medical): Yes  Physical Activity: Not on file  Stress: Not on file  Social Connections: Patient Declined (06/30/2023)   Social Connection and Isolation Panel    Frequency of Communication with Friends and Family: Patient declined    Frequency of Social Gatherings with Friends and Family: Patient declined    Attends Religious Services: Patient declined    Database administrator or Organizations: Patient declined    Attends Engineer, structural: Patient declined    Marital Status: Patient declined   Additional Social History:   Sleep: Good Estimated Sleeping Duration (Last 24 Hours): 6.25-7.75 hours  Appetite:  Good  Current Medications: Current Facility-Administered Medications  Medication Dose Route Frequency Provider Last Rate Last Admin   alum & mag hydroxide-simeth (MAALOX/MYLANTA) 200-200-20 MG/5ML suspension 30 mL  30 mL Oral Q4H PRN Coleman, Carolyn H, NP       haloperidol  (HALDOL ) tablet 5 mg  5 mg Oral TID PRN Mardy Elveria DEL, NP       And   diphenhydrAMINE  (BENADRYL ) capsule 50 mg  50 mg Oral TID PRN Mardy Elveria DEL, NP       haloperidol  lactate (HALDOL ) injection 5 mg  5 mg Intramuscular TID PRN Mardy Elveria DEL, NP       And   diphenhydrAMINE  (BENADRYL ) injection 50 mg  50 mg Intramuscular TID PRN Mardy Elveria DEL, NP       And   LORazepam  (ATIVAN ) injection 2 mg  2 mg Intramuscular TID PRN Mardy Elveria DEL, NP       haloperidol  lactate (HALDOL ) injection 10 mg  10 mg Intramuscular TID PRN Mardy Elveria DEL, NP       And   diphenhydrAMINE   (BENADRYL ) injection 50 mg  50 mg Intramuscular TID PRN Mardy Elveria DEL, NP       And   LORazepam  (ATIVAN ) injection 2 mg  2 mg Intramuscular TID PRN Mardy Elveria DEL, NP       DULoxetine  (CYMBALTA ) DR capsule 30 mg  30 mg Oral Daily Coleman, Carolyn H, NP   30 mg at 11/26/23 9191   hydrOXYzine  (ATARAX ) tablet 25 mg  25 mg Oral TID PRN Mardy Elveria DEL, NP       magnesium  hydroxide (MILK OF MAGNESIA) suspension 30 mL  30 mL Oral Daily PRN Mardy Elveria DEL, NP       naltrexone  (DEPADE) tablet 25 mg  25 mg Oral QHS Ji, Andrew, MD   25 mg at 11/25/23 2105   nicotine  (NICODERM CQ  - dosed in mg/24 hours) patch  14 mg  14 mg Transdermal Daily Pashayan, Alexander S, DO   14 mg at 11/26/23 9191   ondansetron  (ZOFRAN ) tablet 4 mg  4 mg Oral Q8H PRN Lynnette Barter, MD       traZODone  (DESYREL ) tablet 50 mg  50 mg Oral QHS PRN Mardy Elveria DEL, NP        Lab Results:  Results for orders placed or performed during the hospital encounter of 11/23/23 (from the past 48 hours)  TSH     Status: None   Collection Time: 11/26/23  6:06 AM  Result Value Ref Range   TSH 1.151 0.350 - 4.500 uIU/mL    Comment: Performed by a 3rd Generation assay with a functional sensitivity of <=0.01 uIU/mL. Performed at [facility], [address] [place], [place] [address]   Ferritin     Status: None   Collection Time: 11/26/23  6:06 AM  Result Value Ref Range   Ferritin 168 24 - 336 ng/mL    Comment: Performed at [facility], [address], [place], [place] [address]  Iron and TIBC     Status: Abnormal   Collection Time: 11/26/23  6:06 AM  Result Value Ref Range   Iron 126 45 - 182 ug/dL   TIBC 685 749 - 549 ug/dL   Saturation Ratios 40 (H) 17.9 - 39.5 %   UIBC 188 ug/dL    Comment: Performed at [facility], [address] [place], [place] [address]  Reticulocytes     Status: None   Collection Time: 11/26/23  6:06 AM  Result Value Ref Range   Retic  Ct Pct 1.1 0.4 - 3.1 %   RBC. 4.47 4.22 - 5.81 MIL/uL   Retic Count, Absolute 47.4 19.0 - 186.0 K/uL   Immature Retic Fract 4.0 2.3 - 15.9 %    Comment: Performed at [facility], [address], [place], [place] [address]    Blood Alcohol  level:  Lab Results  Component Value Date   [facility] <15 11/23/2023   ETH <10 06/29/2023    Metabolic Disorder Labs: Lab Results  Component Value Date   HGBA1C 5.2 08/27/2014   MPG 103 08/27/2014   MPG 105 03/08/2013   No results found for: PROLACTIN No results found for: CHOL, TRIG, HDL, CHOLHDL, VLDL, LDLCALC  Physical Findings: AIMS:  ,  ,  ,  ,  ,  ,   CIWA:    COWS:     Musculoskeletal: Strength & Muscle Tone: within normal limits Gait & Station: normal Patient leans: N/A  Psychiatric Specialty Exam:  Presentation  General Appearance:  Appropriate for Environment  Eye Contact: Good  Speech: Clear and Coherent  Speech Volume: Normal  Handedness: -- (not assessed)   Mood and Affect  Mood: Anxious; Depressed  Affect: Appropriate; Congruent   Thought Process  Thought Processes: Coherent  Descriptions of Associations:Intact  Orientation:Full (Time, Place and Person)  Thought Content:Rumination  History of Schizophrenia/Schizoaffective disorder:No  Duration of Psychotic Symptoms:N/A  Hallucinations:Hallucinations: None  Ideas of Reference:None  Suicidal Thoughts:Suicidal Thoughts: No  Homicidal Thoughts:Homicidal Thoughts: No   Sensorium  Memory: Recent Fair; Remote Fair  Judgment: Fair  Insight: Fair   Art therapist  Concentration: Fair  Attention Span: Fair  Recall: [employer] of Knowledge: Fair  Language: Fair   Psychomotor Activity  Psychomotor Activity:No data recorded  Assets  Assets: Communication Skills; Desire for Improvement; Physical Health   Sleep  Sleep: Sleep:  Good    Physical Exam: Physical Exam Vitals  reviewed.  Constitutional:      Appearance: Normal appearance. He is normal weight.  HENT:     Head: Normocephalic and atraumatic.  Pulmonary:     Effort: Pulmonary effort is normal.  Neurological:     General: No focal deficit present.    Review of Systems  Gastrointestinal:  Negative for abdominal pain, constipation, diarrhea, nausea and vomiting.  Neurological:  Negative for dizziness and headaches.  Psychiatric/Behavioral:  Negative for hallucinations and suicidal ideas. The patient is not nervous/anxious.    Blood pressure 128/83, pulse 71, temperature 97.6 F (36.4 C), resp. rate 16, height 5' 7 (1.702 m), weight 52.2 kg, SpO2 100%. Body mass index is 18.01 kg/m.   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    Today, he reports significant improvement in mood, with resolution of depressive symptoms and denial of SI, HI, AVH.  He notes improved sleep and appetite and attributes his mood improvement to his current medication regimen, which he feels has reduced his substance cravings.  Labs including TSH, vitamin D, and iron panel returned within normal limits, ruling out contributory metabolic or nutritional causes worse depressive episode.  He continues to endorse mild cold sensitivity, possibly related to his chronic neuropathy, but otherwise has no physical complaints of medication side effects.  PLAN: #Major Depressive Disorder, recurrent episode w/o psychotic features #Substance induced mood disorder #Cannabis Use Disorder #Alcohol  Use Disorder in sustained remission -continue home duloxetine  30 mg -continue naltrexone  25 mg qhs -nicotine  patch and gum for NRT -PRNs; hydroxyzine , trazodone , maalox, milk of magnesia, ondansetron  -- As  needed agitation protocol in-place -- labs: iron and tibc, tsh, reticulocyte, ferritin   #Back Pain -lidocaine  patch   Disposition Planning: -- Estimated LOS: 3-5 days --Estimated Discharge Date 7/27 --Barriers to Discharge: ongoing severe depression -- Discharge Goals: Return home with outpatient referrals for mental health follow-up including medication management/psychotherapy     Daily contact with patient to assess and evaluate symptoms and progress in treatment and Medication management  Alan Maiden, MD 11/26/2023, 10:56 AM

## 2023-11-26 NOTE — Progress Notes (Signed)
(  Sleep Hours) - 7.25 hours as of 0530 (Any PRNs that were needed, meds refused, or side effects to meds)- No PRN meds given, no meds refused.  (Any disturbances and when (visitation, over night)- None (Concerns raised by the patient)- No concerns raised by pt. Pt reports that he believes the naltrexone  is working well to ease his cravings.  (SI/HI/AVH)- Denies current SI/HI/AVH.

## 2023-11-26 NOTE — Plan of Care (Signed)

## 2023-11-26 NOTE — Group Note (Signed)
 Date:  11/26/2023 Time:  1:23 PM  Group Topic/Focus:   Music Therapy:   The goal of this group is to increase self-awareness, emotional expression, autonomy, and self esteem through music. More specifically, a lyric substitution activity.    Participation Level:  Did Not Attend   [name] 11/26/2023, 1:23 PM

## 2023-11-26 NOTE — Plan of Care (Signed)
   Problem: Education: Goal: Emotional status will improve Outcome: Progressing Goal: Mental status will improve Outcome: Progressing Goal: Verbalization of understanding the information provided will improve Outcome: Progressing   Problem: Activity: Goal: Interest or engagement in activities will improve Outcome: Progressing

## 2023-11-26 NOTE — Group Note (Signed)
 Date:  11/26/2023 Time:  9:25 AM  Group Topic/Focus:  Goals Group:   The focus of this group is to help patients establish daily goals to achieve during treatment and discuss how the patient can incorporate goal setting into their daily lives to aide in recovery. Orientation:   The focus of this group is to educate the patient on the purpose and policies of crisis stabilization and provide a format to answer questions about their admission.  The group details unit policies and expectations of patients while admitted.    Participation Level:  Did Not Attend   [name] 11/26/2023, 9:25 AM

## 2023-11-27 MED ORDER — ENSURE PLUS HIGH PROTEIN PO LIQD
237.0000 mL | Freq: Two times a day (BID) | ORAL | Status: DC
  Administered 2023-11-28: 237 mL via ORAL
  Filled 2023-11-27 (×3): qty 237

## 2023-11-27 MED ORDER — LIDOCAINE 5 % EX PTCH: 1.0000 | MEDICATED_PATCH | Freq: Every day | CUTANEOUS | Status: DC

## 2023-11-27 NOTE — Progress Notes (Signed)
   11/27/23 2200  Psych Admission Type (Psych Patients Only)  Admission Status Voluntary  Psychosocial Assessment  Patient Complaints Depression  Eye Contact Fair  Facial Expression Animated  Affect Depressed  Speech Logical/coherent  Interaction Assertive  Motor Activity Other (Comment) (WDL)  Appearance/Hygiene Unremarkable  Behavior Characteristics Cooperative;Calm  Mood Pleasant  Thought Process  Coherency WDL  Content WDL  Delusions None reported or observed  Perception WDL  Hallucination None reported or observed  Judgment Limited  Confusion None  Danger to Self  Current suicidal ideation? Denies   (Sleep Hours) - (Any PRNs that were needed, meds refused, or side effects to meds)-  (Any disturbances and when (visitation, over night)- (Concerns raised by the patient)-  (SI/HI/AVH)-

## 2023-11-27 NOTE — BHH Group Notes (Signed)
 Adult Psychoeducational Group Note   Date:  11/27/2023 Time:  9:10 AM   Group Topic/Focus:  Goals Group:   The focus of this group is to help patients establish daily goals to achieve during treatment and discuss how the patient can incorporate goal setting into their daily lives to aide in recovery.   Participation Level:  Did Not Attend     Additional Comments:  Pt was invited but did not attend orientation/goals group.   Niel CHRISTELLA Nightingale 11/27/2023, 9:10 AM

## 2023-11-27 NOTE — Plan of Care (Signed)

## 2023-11-27 NOTE — Progress Notes (Signed)
 [facility] MD Progress Note  11/27/2023 9:20 AM [name]  MRN:  [id number] Subjective:   [name] is a 54 y.o. who presented to [facility] voluntarily for SI secondary to worsening depression and was admitted to [facility] for further evaluation (admitted on 7/22, total LOS: 3 days). 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  11/27/23: Case was discussed in the multidisciplinary team. MAR was reviewed and [name] is compliant with medications. PRNs Trazodone  50mg  and Atarax  25mg  were both used last night to help with sleep and anxiety. No other PRNs used. [name] May feeling better today. Mood is fair, anxiety and depression both 0/10. Appeitite fair with Energy being slightly lower than baseline. [name] ongoing back pain that interfered with his sleep, as well as some intermittent isolated chest pain. States that his cravings for pot have decreased since starting the medication.  11/26/2023: On interview today [name] slept good last night.  [name] his appetite is doing good.  [name] is feeling better and is ready for discharge.  [name] states [name] woke up today with a significantly better mood, stating I feel much better-I believe the medications are working because I am no longer having severe cravings for pot.  [name] still endorses cold sensitivity.  [name] denies HI, SI, and AVH.  [name] no issues with his medications.  [name] inquired about a possible discharge date, stating [name] needs to get back to work and [name] has things to do.  Principal Problem: MDD (major depressive disorder), recurrent severe, without psychosis (HCC) Diagnosis: Principal Problem:   MDD (major depressive disorder), recurrent severe, without psychosis (HCC) Active Problems:   Alcohol  use disorder, severe, in sustained remission (HCC)   Cannabis use disorder, severe, dependence (HCC)  Total Time spent with  [name]: 45 minutes  Past Psychiatric History:  -Recurrent MDD with severe features and possible psychosis (AH/VH/paranoia) requiring at least 6 [facility] (most recent 02/18/23) -Cannabis use disorder, current -Tobacco use disorder, current -Hx May Alcohol  use disorder (10 years sober) -No current psychiatrist/therapist -No hx May suicidal or homicidal attempts  Substance Abuse Hx: Alcohol : sober for ~10 years Tobacco: 0.5 ppd Cannabis: marijuana daily (positive on UDS) Other Illicit drugs: denies other illicit drug use Rx drug abuse: None reported  Rehab hx: [facility] for AUD, has been to numerous others most recently and successfully at [facility]   Past Medical History:  Past Medical History:  Diagnosis Date   Alcohol  abuse    Anemia    Colitis    Severe major depression with psychotic features (HCC)    History reviewed. No pertinent surgical history. Family History:  Family History  Problem Relation Age May Onset   Hypertension Mother    Hypertension Father    Hypertension Other    Family Psychiatric  History:  Medical: None reported. Psych: None reported. Psych Rx: None reported. SA/HA: None reported. Substance use family hx: Not asked.  Social History:  Social History   Substance and Sexual Activity  Alcohol  Use No   Comment: former  last use 5+ year ago     Social History   Substance and Sexual Activity  Drug Use Yes   Types: Marijuana    Social History   Socioeconomic History   Marital status: Single    Spouse name: Not on file   Number May children: Not on file   Years May education: Not on file   Highest  education level: Not on file  Occupational History   Not on file  Tobacco Use   Smoking status: Every Day    Types: Cigarettes   Smokeless tobacco: Never  Vaping Use   Vaping status: Never Used  Substance and Sexual Activity   Alcohol  use: No    Comment: former  last use 5+ year ago   Drug use: Yes    Types: Marijuana   Sexual activity: Yes     Birth control/protection: Condom  Other Topics Concern   Not on file  Social History Narrative   Not on file   Social Drivers May Health   Financial Resource Strain: High Risk (03/19/2019)   Overall Financial Resource Strain (CARDIA)    Difficulty May Paying Living Expenses: Very hard  Food Insecurity: Food Insecurity Present (11/24/2023)   Hunger Vital Sign    Worried About Running Out May Food in the Last Year: Often true    Ran Out May Food in the Last Year: Often true  Transportation Needs: Unmet Transportation Needs (11/24/2023)   PRAPARE - Administrator, Civil Service (Medical): Yes    Lack May Transportation (Non-Medical): Yes  Physical Activity: Not on file  Stress: Not on file  Social Connections: [name] Declined (06/30/2023)   Social Connection and Isolation Panel    Frequency May Communication with Friends and Family: [name] declined    Frequency May Social Gatherings with Friends and Family: [name] declined    Attends Religious Services: [name] declined    Database administrator or Organizations: [name] declined    Attends Engineer, structural: [name] declined    Marital Status: [name] declined   Additional Social History:   Sleep: Good Estimated Sleeping Duration (Last 24 Hours): 4.75-5.75 hours  Appetite:  Good  Current Medications: Current Facility-Administered Medications  Medication Dose Route Frequency Provider Last Rate Last Admin   alum & mag hydroxide-simeth (MAALOX/MYLANTA) 200-200-20 MG/5ML suspension 30 mL  30 mL Oral Q4H PRN Coleman, Carolyn H, NP       haloperidol  (HALDOL ) tablet 5 mg  5 mg Oral TID PRN Mardy Elveria DEL, NP       And   diphenhydrAMINE  (BENADRYL ) capsule 50 mg  50 mg Oral TID PRN Mardy Elveria DEL, NP       haloperidol  lactate (HALDOL ) injection 5 mg  5 mg Intramuscular TID PRN Mardy Elveria DEL, NP       And   diphenhydrAMINE  (BENADRYL ) injection 50 mg  50 mg Intramuscular TID PRN Mardy Elveria DEL, NP        And   LORazepam  (ATIVAN ) injection 2 mg  2 mg Intramuscular TID PRN Mardy Elveria DEL, NP       haloperidol  lactate (HALDOL ) injection 10 mg  10 mg Intramuscular TID PRN Mardy Elveria DEL, NP       And   diphenhydrAMINE  (BENADRYL ) injection 50 mg  50 mg Intramuscular TID PRN Mardy Elveria DEL, NP       And   LORazepam  (ATIVAN ) injection 2 mg  2 mg Intramuscular TID PRN Mardy Elveria DEL, NP       DULoxetine  (CYMBALTA ) DR capsule 30 mg  30 mg Oral Daily Mardy Elveria DEL, NP   30 mg at 11/26/23 9191   hydrOXYzine  (ATARAX ) tablet 25 mg  25 mg Oral TID PRN Coleman, Carolyn H, NP   25 mg at 11/26/23 2246   magnesium  hydroxide (MILK May MAGNESIA) suspension 30 mL  30 mL Oral Daily PRN Mardy,  Elveria DEL, NP       naltrexone  (DEPADE) tablet 25 mg  25 mg Oral QHS Ji, Andrew, MD   25 mg at 11/26/23 2100   nicotine  (NICODERM CQ  - dosed in mg/24 hours) patch 14 mg  14 mg Transdermal Daily Pashayan, Alexander S, DO   14 mg at 11/26/23 9191   ondansetron  (ZOFRAN ) tablet 4 mg  4 mg Oral Q8H PRN Lynnette Barter, MD       traZODone  (DESYREL ) tablet 50 mg  50 mg Oral QHS PRN Mardy Elveria DEL, NP   50 mg at 11/26/23 2246    Lab Results:  Results for orders placed or performed during the hospital encounter May 11/23/23 (from the past 48 hours)  TSH     Status: None   Collection Time: 11/26/23  6:06 AM  Result Value Ref Range   TSH 1.151 0.350 - 4.500 uIU/mL    Comment: Performed by a 3rd Generation assay with a functional sensitivity May <=0.01 uIU/mL. Performed at [facility], [address] [place], [place] [address]   Ferritin     Status: None   Collection Time: 11/26/23  6:06 AM  Result Value Ref Range   Ferritin 168 24 - 336 ng/mL    Comment: Performed at [facility], [address] [place], [place] [address]  Iron and TIBC     Status: Abnormal   Collection Time: 11/26/23  6:06 AM  Result Value Ref Range   Iron 126 45 - 182 ug/dL   TIBC 685 749 - 549  ug/dL   Saturation Ratios 40 (H) 17.9 - 39.5 %   UIBC 188 ug/dL    Comment: Performed at [facility], [address], [place], [place] [address]  Reticulocytes     Status: None   Collection Time: 11/26/23  6:06 AM  Result Value Ref Range   Retic Ct Pct 1.1 0.4 - 3.1 %   RBC. 4.47 4.22 - 5.81 MIL/uL   Retic Count, Absolute 47.4 19.0 - 186.0 K/uL   Immature Retic Fract 4.0 2.3 - 15.9 %    Comment: Performed at [facility], [address] [place], [place] [address]  VITAMIN D  25 Hydroxy (Vit-D Deficiency, Fractures)     Status: Abnormal   Collection Time: 11/26/23  6:06 AM  Result Value Ref Range   Vit D, 25-Hydroxy 18.07 (L) 30 - 100 ng/mL    Comment: (NOTE) Vitamin D  deficiency has been defined by the Institute May Medicine  and an Endocrine Society practice guideline as a level May serum 25-OH  vitamin D  less than 20 ng/mL (1,2). The Endocrine Society went on to  further define vitamin D  insufficiency as a level between 21 and 29  ng/mL (2).  1. IOM (Institute May Medicine). 2010. Dietary reference intakes for  calcium and D. [place]: [employer]. 2. Holick MF, [place], Bischoff-Ferrari HA, et al. Evaluation,  treatment, and prevention May vitamin D  deficiency: an Endocrine  Society clinical practice guideline, JCEM. 2011 Jul; 96(7): 1911-30.  Performed at [facility], [address], [place], [place] [address]     Blood Alcohol  level:  Lab Results  Component Value Date   [facility] <15 11/23/2023   ETH <10 06/29/2023    Metabolic Disorder Labs: Lab Results  Component Value Date   HGBA1C 5.2 08/27/2014   MPG 103 08/27/2014   MPG 105 03/08/2013   No results found for: PROLACTIN No  results found for: CHOL, TRIG, HDL, CHOLHDL, VLDL, LDLCALC  Physical Findings: AIMS:  ,  ,  ,  ,  ,  ,   CIWA:    COWS:     Musculoskeletal: Strength & Muscle Tone: within normal limits Gait & Station:  normal [name] leans: N/A  Psychiatric Specialty Exam:  Presentation  General Appearance:  Appropriate for Environment  Eye Contact: Good  Speech: Clear and Coherent; Normal Rate  Speech Volume: Normal  Handedness: -- (not assessed)   Mood and Affect  Mood: -- (fair)  Affect: Congruent   Thought Process  Thought Processes: Goal Directed; Linear; Coherent  Descriptions May Associations:Intact  Orientation:Full (Time, Place and Person)  Thought Content:Logical  History May Schizophrenia/Schizoaffective disorder:No  Duration May Psychotic Symptoms:N/A  Hallucinations:Hallucinations: None  Ideas May Reference:None  Suicidal Thoughts:Suicidal Thoughts: No  Homicidal Thoughts:Homicidal Thoughts: No   Sensorium  Memory: Immediate Good; Recent Good  Judgment: Good  Insight: Fair   Art therapist  Concentration: Good  Attention Span: Good  Recall: Good  Fund May Knowledge: Good  Language: Good   Psychomotor Activity  Psychomotor Activity:Psychomotor Activity: Normal   Assets  Assets: Desire for Improvement   Sleep  Sleep: Sleep: Fair    Physical Exam: Physical Exam Vitals reviewed.  Constitutional:      General: [name] is not in acute distress.    Appearance: [name] is not toxic-appearing.  Pulmonary:     Effort: Pulmonary effort is normal. No respiratory distress.  Neurological:     Mental Status: [name] is alert.     Gait: Gait normal.    Review May Systems  Respiratory: Negative.    Cardiovascular:  Positive for chest pain.  Gastrointestinal: Negative.   Musculoskeletal:  Positive for back pain.  Neurological:  Negative for tingling and weakness.  Psychiatric/Behavioral:  Negative for hallucinations and suicidal ideas. The [name] is not nervous/anxious.    Blood pressure 113/82, pulse 84, temperature 97.6 F (36.4 C), resp. rate 16, height 5' 7 (1.702 m), weight 52.2 kg, SpO2 100%. Body mass index is 18.01  kg/m.   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    11/27/23: [name] significant improvement in mood, with resolution May depressive symptoms and denial May SI, HI, AVH.  [name] notes improved sleep and appetite and attributes his mood improvement to his current medication regimen, which [name] feels has reduced his substance cravings.  Labs including TSH, vitamin D , and iron panel are within normal limits, ruling out contributory metabolic or nutritional causes worse depressive episode. [name] reported intermittent chest pain, EKG ordered. Will likely discharge tomorrow (7/27).  PLAN: #Major Depressive Disorder, recurrent episode w/o psychotic features #Substance induced mood disorder #Cannabis Use Disorder #Alcohol  Use Disorder in sustained remission -continue home duloxetine  30 mg -continue naltrexone  25 mg qhs -nicotine  patch and gum for NRT -PRNs; hydroxyzine , trazodone , maalox, milk May magnesia, ondansetron  -- As needed agitation protocol in-place -- labs: iron and tibc, tsh, reticulocyte, ferritin   #Back Pain - Ordered 5% Lidocaine  patch QD   Disposition Planning: -- Estimated LOS: 3-5 days -- Estimated Discharge Date 7/27 -- Barriers to Discharge: ongoing severe depression -- Discharge Goals: Return home with outpatient referrals for mental health follow-up including medication management/psychotherapy     Daily contact with  [name] to assess and evaluate symptoms and progress in treatment and Medication management  Penne Mori, DO 11/27/2023, 9:20 AM

## 2023-11-27 NOTE — Progress Notes (Signed)
(  Sleep Hours) - 6.75 hours as of 0530 (Any PRNs that were needed, meds refused, or side effects to meds)- PRN vistaril  25 mg and trazadone 50 mg given at pt request, no meds refused.  (Any disturbances and when (visitation, over night)- None  (Concerns raised by the patient)- No concerns raised by the pt.  (SI/HI/AVH)- Denies SI/HI/AVH

## 2023-11-27 NOTE — Plan of Care (Signed)
   Problem: Education: Goal: Emotional status will improve Outcome: Progressing Goal: Mental status will improve Outcome: Progressing Goal: Verbalization of understanding the information provided will improve Outcome: Progressing   Problem: Activity: Goal: Interest or engagement in activities will improve Outcome: Progressing

## 2023-11-27 NOTE — Progress Notes (Signed)
 D: Patient is alert, oriented, pleasant, and cooperative. Denies SI, HI, AVH, and verbally contracts for safety. Patient reports back pain.    A: Scheduled medications administered per MD order. Patient did refuse the lidocaine  patch that was ordered for him. Support provided. Patient educated on safety on the unit and medications. Routine safety checks every 15 minutes. Patient stated understanding to tell nurse about any new physical symptoms. Patient understands to tell staff of any needs.     R: No adverse drug reactions noted. Patient remains safe at this time and will continue to monitor.    11/27/23 1300  Psych Admission Type (Psych Patients Only)  Admission Status Voluntary  Psychosocial Assessment  Patient Complaints Depression  Eye Contact Fair  Facial Expression Flat  Affect Depressed  Speech Logical/coherent  Interaction Assertive  Motor Activity Other (Comment) (WNL)  Appearance/Hygiene Unremarkable  Behavior Characteristics Cooperative;Calm  Mood Depressed;Pleasant  Thought Process  Coherency WDL  Content WDL  Delusions None reported or observed  Perception WDL  Hallucination None reported or observed  Judgment Poor  Confusion None  Danger to Self  Current suicidal ideation? Denies  Agreement Not to Harm Self Yes  Description of Agreement verbal  Danger to Others  Danger to Others None reported or observed

## 2023-11-27 NOTE — Progress Notes (Signed)
   11/27/23 1600  Spiritual Encounters  Type of Visit Initial  Care provided to: Patient  Referral source Chaplain assessment  Reason for visit Grief/loss  OnCall Visit No   While on unit I engaged Mr. [name] to offer spiritual care support.  [name] engaged in life review, and this included loss of several significant individuals over past 5-79yrs including father, dola, and eldest daughter. He engaged in spiritual reflection rooted in [place] faith outlook. He explored themes of loss, hopes, and frustration that carry theme of judgment or disappointment (in self or perceived in other including those who have passed on).  I provided compassionate presence and offered both active and reflective listening. I invited some reframing of narratives and encouraged positive self-regard, focusing on successes, abilities and goals. I provided prayer at [name]'s request.  Avanthika Dehnert L. Fredrica, M.Div [phone]

## 2023-11-28 DIAGNOSIS — F332 Major depressive disorder, recurrent severe without psychotic features: Secondary | ICD-10-CM

## 2023-11-28 MED ORDER — DULOXETINE HCL 30 MG PO CPEP: 30.0000 mg | ORAL_CAPSULE | Freq: Every day | ORAL | 0 refills | Status: DC

## 2023-11-28 MED ORDER — NALTREXONE HCL 50 MG PO TABS: 25.0000 mg | ORAL_TABLET | Freq: Every day | ORAL | 0 refills | Status: DC

## 2023-11-28 MED ORDER — NICOTINE 14 MG/24HR TD PT24: 14.0000 mg | MEDICATED_PATCH | Freq: Every day | TRANSDERMAL | 0 refills | Status: DC

## 2023-11-28 NOTE — Progress Notes (Signed)
(  Sleep Hours) -7.5 (Any PRNs that were needed, meds refused, or side effects to meds)- none (Any disturbances and when (visitation, over night)-none (Concerns raised by the patient)- none (SI/HI/AVH)- denies all

## 2023-11-28 NOTE — Progress Notes (Signed)
 Patient verbalizes readiness for discharge. All patient belongings returned to patient. Discharge instructions read and discussed with patient (appointments, medications, resources). Patient expressed gratitude for care provided. Patient discharged to lobby at 1157 where taxi driver was waiting.

## 2023-11-28 NOTE — [facility] Suicide Risk Assessment (Signed)
 [facility] INPATIENT:  Family/Significant Other Suicide Prevention Education  Suicide Prevention Education:  SPE completed with [name], as patient refused to consent to family contact. SPI pamphlet provided to patient and patient was encouraged to share information with support network, ask questions, and talk about any concerns relating to SPE. Pt denies access to guns/firearms and verbalized understanding of information provided. Mobile Crisis and 988 information also provided to patient.   The suicide prevention education provided includes the following: Suicide risk factors Suicide prevention and interventions National Suicide Hotline telephone number [facility] assessment telephone number [facility] Emergency Assistance 911 [facility] and/or Residential Mobile Crisis Unit telephone number  Request made to patient: Remove weapons (e.g., guns, rifles, knives), all items previously/currently identified as safety concern.   Remove drugs/medications (over-the-counter, prescriptions, illicit drugs), all items previously/currently identified as a safety concern.  The patient stated understanding of the suicide prevention education information provided and agrees to remove the items of safety concern listed above.  [name] 11/28/2023, 12:53 PM

## 2023-11-28 NOTE — Group Note (Signed)
 Date:  11/28/2023 Time:  12:57 AM  Group Topic/Focus:  Wrap-Up Group:   The focus of this group is to help patients review their daily goal of treatment and discuss progress on daily workbooks.    Participation Level:  Active  Participation Quality:  Appropriate  Affect:  Appropriate  Cognitive:  Appropriate  Insight: Appropriate  Engagement in Group:  Engaged  Modes of Intervention:  Discussion  Additional Comments:  Patient stated he had a 9 out of 10 day.  Patient stated that he is looking forward to his discharged tomorrow   [name] 11/28/2023, 12:57 AM

## 2023-11-28 NOTE — [facility] Suicide Risk Assessment (Addendum)
 [name]   Principal Problem: MDD (major depressive disorder), recurrent severe, without psychosis (HCC) Discharge Diagnoses: Principal Problem:   MDD (major depressive disorder), recurrent severe, without psychosis (HCC) Active Problems:   Alcohol  use disorder, severe, in sustained remission (HCC)   Cannabis use disorder, severe, dependence (HCC)  During the patient's hospitalization, patient had extensive initial psychiatric evaluation, and follow-up psychiatric evaluations every day.  Psychiatric diagnoses provided upon initial May: MDD (major depressive disorder), recurrent severe, without psychosis (HCC)   Alcohol  use disorder, severe, in sustained remission (HCC)   Cannabis use disorder, severe, dependence (HCC)  Patient's psychiatric medications were adjusted on admission: Restarted his home Cymbalta .  Started Naltrexone .  During the hospitalization, other adjustments were made to the patient's psychiatric medication regimen: None  Gradually, patient started adjusting to milieu.   Patient's care was discussed during the interdisciplinary team meeting every day during the hospitalization.  The patient is not having side effects to prescribed psychiatric medication.  The patient reports their target psychiatric symptoms of depression and THC cravings responded well to the psychiatric medications, and the patient reports overall benefit other psychiatric hospitalization. Supportive psychotherapy was provided to the patient. The patient also participated in regular group therapy while admitted.   Labs were reviewed with the patient, and abnormal results were discussed with the patient.  The patient denied having suicidal thoughts more than 48 hours prior to discharge.  Patient denies having homicidal thoughts.  Patient denies having auditory hallucinations.  Patient denies any visual hallucinations.  Patient denies having paranoid thoughts.  The  patient is able to verbalize their individual safety plan to this provider.  It is recommended to the patient to continue psychiatric medications as prescribed, after discharge from the hospital.    It is recommended to the patient to follow up with your outpatient psychiatric provider and PCP.  Discussed with the patient, the impact of alcohol , drugs, tobacco have been there overall psychiatric and medical wellbeing, and total abstinence from substance use was recommended the patient.  Total Time spent with patient: 20 minutes  Musculoskeletal: Strength & Muscle Tone: within normal limits Gait & Station: normal Patient leans: N/A  Psychiatric Specialty Exam  Presentation  General Appearance:  Appropriate for Environment  Eye Contact: Good  Speech: Clear and Coherent; Normal Rate  Speech Volume: Normal  Handedness: -- (not assessed)   Mood and Affect  Mood: -- (ok)  Duration of Depression Symptoms: Less than two weeks  Affect: Appropriate; Congruent   Thought Process  Thought Processes: Coherent; Goal Directed  Descriptions of Associations:Intact  Orientation:Full (Time, Place and Person)  Thought Content:WDL; Logical  History of Schizophrenia/Schizoaffective disorder:No  Duration of Psychotic Symptoms:N/A  Hallucinations:Hallucinations: None  Ideas of Reference:None  Suicidal Thoughts:Suicidal Thoughts: No  Homicidal Thoughts:Homicidal Thoughts: No   Sensorium  Memory: Immediate Good; Recent Good  Judgment: Good  Insight: Good   Executive Functions  Concentration: Good  Attention Span: Good  Recall: Good  Fund of Knowledge: Good  Language: Good   Psychomotor Activity  Psychomotor Activity: Psychomotor Activity: Normal   Assets  Assets: Desire for Improvement; Resilience   Sleep  Sleep: Sleep: Fair  Estimated Sleeping Duration (Last 24 Hours): 6.75-7.75 hours  Physical Exam: Physical Exam Vitals and  nursing note reviewed.  Constitutional:      General: He is not in acute distress.    Appearance: Normal appearance. He is normal weight. He is not ill-appearing or toxic-appearing.  HENT:     Head: Normocephalic  and atraumatic.  Pulmonary:     Effort: Pulmonary effort is normal.  Musculoskeletal:        General: Normal range of motion.  Neurological:     General: No focal deficit present.     Mental Status: He is alert.    Review of Systems  Respiratory:  Negative for cough and shortness of breath.   Cardiovascular:  Negative for chest pain.  Gastrointestinal:  Negative for abdominal pain, constipation, diarrhea, nausea and vomiting.  Neurological:  Negative for dizziness, weakness and headaches.  Psychiatric/Behavioral:  Negative for depression, hallucinations and suicidal ideas. The patient is not nervous/anxious.    Blood pressure 121/84, pulse 64, temperature 97.9 F (36.6 C), resp. rate 16, height 5' 7 (1.702 m), weight 52.2 kg, SpO2 100%. Body mass index is 18.01 kg/m.  Mental Status Per Nursing May::   On Admission:  Suicidal ideation indicated by patient  Demographic Factors:  Male, Divorced or widowed, Living alone, and Unemployed  Loss Factors: Financial problems/change in socioeconomic status  Historical Factors: Anniversary of important loss  Risk Reduction Factors:   Sense of responsibility to family and Positive coping skills or problem solving skills  Continued Clinical Symptoms:  More than one psychiatric diagnosis Previous Psychiatric Diagnoses and Treatments Medical Diagnoses and Treatments/Surgeries  Cognitive Features That Contribute To Risk:  None    Suicide Risk:  Minimal: No identifiable suicidal ideation.  Patients presenting with no risk factors but with morbid ruminations; may be classified as minimal risk based on the severity of the depressive symptoms.   Follow-up Information     [facility] Follow up on 12/03/2023.   Why: You  have a hospital follow up appointment for therapy and medication management services on  12/03/23 at 11:00 am.  This will be a Virtual telehealth appointment.  * Please call to cancel if you do not wish to attend. Contact information: [address]  [address] [place] [address] [phone]                 Plan Of Care/Follow-up recommendations:  Activity: as tolerated  Diet: heart healthy  Other: -Follow-up with your outpatient psychiatric provider -instructions on appointment date, time, and address (location) are provided to you in discharge paperwork.  -Take your psychiatric medications as prescribed at discharge - instructions are provided to you in the discharge paperwork  -Follow-up with outpatient primary care doctor and other specialists -for management of chronic medical disease, including: Continued care your substance abuse.    -Testing: Follow-up with outpatient provider for abnormal lab results: None  -Recommend abstinence from alcohol , tobacco, and other illicit drug use at discharge.   -If your psychiatric symptoms recur, worsen, or if you have side effects to your psychiatric medications, call your outpatient psychiatric provider, 911, 988 or go to the nearest emergency department.  -If suicidal thoughts recur, call your outpatient psychiatric provider, 911, 988 or go to the nearest emergency department.   Marsa GORMAN Rosser, DO 11/28/2023, 10:51 AM

## 2023-11-28 NOTE — Discharge Summary (Signed)
 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    Patient reports having recent worsening depression that has resulted in SI for the past 2 weeks.  He reports primarily passive suicidal ideation but occasionally will have thoughts of going back to drinking alcohol  despite having 11 years of sustained remission from alcohol  use disorder.  He reports primarily financial stressors specifically being unable to afford $120 to get his driving license. He reports also relationship stressors related to romantic ones as well as with his daughters. He endorses present passive SI but is able to contract for safety. He reports often ruminating due to wasting time drinking alcohol  for several years.  He endorses symptoms of depression including depressed mood, anhedonia, poor sleep, guilt, hopelessness, crying spells, and passive SI. He endorses struggling with chronic pain in his back which he needs to see PCP  Principal Problem: MDD (major depressive disorder), recurrent severe, without psychosis (HCC) Discharge Diagnoses: Principal Problem:   MDD (major depressive disorder), recurrent severe, without psychosis (HCC) Active Problems:   Alcohol  use disorder, severe, in sustained remission (HCC)   Cannabis use disorder, severe, dependence (HCC)   Past Psychiatric History:  -Recurrent MDD with severe features and possible psychosis (AH/VH/paranoia) requiring at least 6 [facility] (most recent 02/18/23) -Cannabis use  disorder, current -Tobacco use disorder, current -Hx of Alcohol  use disorder (10 years sober) -No current psychiatrist/therapist -No hx of suicidal or homicidal attempts  Past Medical History:  Past Medical History:  Diagnosis Date   Alcohol  abuse    Anemia    Colitis    Severe major depression with psychotic features (HCC)    History reviewed. No pertinent surgical history. Family History:  Family History  Problem Relation Age of Onset   Hypertension Mother    Hypertension Father    Hypertension Other    Family Psychiatric  History:  Medical: None reported. Psych: None reported. Psych Rx: None reported. SA/HA: None reported.  Social History:  Social History   Substance and Sexual Activity  Alcohol  Use No   Comment: former  last use 5+ year ago     Social History   Substance and Sexual Activity  Drug Use Yes   Types: Marijuana    Social History   Socioeconomic History   Marital status: Single    Spouse name: Not on file   Number of children: Not on file   Years of education: Not on file   Highest education level: Not on file  Occupational History   Not on file  Tobacco Use   Smoking status: Every Day    Types: Cigarettes   Smokeless tobacco: Never  Vaping Use   Vaping status: Never Used  Substance and Sexual Activity   Alcohol  use: No    Comment: former  last use 5+ year ago   Drug use: Yes    Types: Marijuana   Sexual activity: Yes    Birth control/protection: Condom  Other Topics Concern   Not on file  Social History  Narrative   Not on file   Social Drivers of Health   Financial Resource Strain: High Risk (03/19/2019)   Overall Financial Resource Strain (CARDIA)    Difficulty of Paying Living Expenses: Very hard  Food Insecurity: Food Insecurity Present (11/24/2023)   Hunger Vital Sign    Worried About Running Out of Food in the Last Year: Often true    Ran Out of Food in the Last Year: Often true  Transportation Needs: Unmet  Transportation Needs (11/24/2023)   PRAPARE - Administrator, Civil Service (Medical): Yes    Lack of Transportation (Non-Medical): Yes  Physical Activity: Not on file  Stress: Not on file  Social Connections: Patient Declined (06/30/2023)   Social Connection and Isolation Panel    Frequency of Communication with Friends and Family: Patient declined    Frequency of Social Gatherings with Friends and Family: Patient declined    Attends Religious Services: Patient declined    Database administrator or Organizations: Patient declined    Attends Banker Meetings: Patient declined    Marital Status: Patient declined    Hospital Course:   During the patient's hospitalization, patient had extensive initial psychiatric evaluation, and follow-up psychiatric evaluations every day.  Psychiatric diagnoses provided upon initial assessment: MDD (major depressive disorder), recurrent severe, without psychosis (HCC)   Alcohol  use disorder, severe, in sustained remission (HCC)   Cannabis use disorder, severe, dependence (HCC)  Patient's psychiatric medications were adjusted on admission: Restarted his home Cymbalta . Started Naltrexone .   During the hospitalization, other adjustments were made to the patient's psychiatric medication regimen: None  Patient's care was discussed during the interdisciplinary team meeting every day during the hospitalization.  The patient is not having side effects to prescribed psychiatric medication.  Gradually, patient started adjusting to milieu. The patient was evaluated each day by a clinical provider to ascertain response to treatment. Improvement was noted by the patient's report of decreasing symptoms, improved sleep and appetite, affect, medication tolerance, behavior, and participation in unit programming.  Patient was asked each day to complete a self inventory noting mood, mental status, pain, new symptoms, anxiety and concerns.   Symptoms  were reported as significantly decreased or resolved completely by discharge.  The patient reports that their mood is stable.  The patient denied having suicidal thoughts for more than 48 hours prior to discharge.  Patient denies having homicidal thoughts.  Patient denies having auditory hallucinations.  Patient denies any visual hallucinations or other symptoms of psychosis.  The patient was motivated to continue taking medication with a goal of continued improvement in mental health.   The patient reports their target psychiatric symptoms of depression and THC Cravings responded well to the psychiatric medications, and the patient reports overall benefit other psychiatric hospitalization. Supportive psychotherapy was provided to the patient. The patient also participated in regular group therapy while hospitalized. Coping skills, problem solving as well as relaxation therapies were also part of the unit programming.  Labs were reviewed with the patient, and abnormal results were discussed with the patient.  The patient is able to verbalize their individual safety plan to this provider.  # It is recommended to the patient to continue psychiatric medications as prescribed, after discharge from the hospital.    # It is recommended to the patient to follow up with your outpatient psychiatric provider and PCP.  # It was discussed with the patient, the impact of alcohol , drugs, tobacco have been there overall psychiatric and  medical wellbeing, and total abstinence from substance use was recommended the patient.ed.  # Prescriptions provided or sent directly to preferred pharmacy at discharge. Patient agreeable to plan. Given opportunity to ask questions. Appears to feel comfortable with discharge.    # In the event of worsening symptoms, the patient is instructed to call the crisis hotline, 911 and or go to the nearest ED for appropriate evaluation and treatment of symptoms. To follow-up with primary  care provider for other medical issues, concerns and or health care needs  # Patient was discharged home with a plan to follow up as noted below.    On day of discharge he reports feeling good.  He reports no side effects to his medications.  He reports his sleep is good.  He reports his appetite is good.  He reports no SI, HI, or AVH.  Discussed with him the importance of taking his medications as prescribed and attending his follow up appointments and he reported understanding.  Discussed with him what to do in the event of a future crisis.  Discussed that he can go to [facility], go to the nearest ED, or call 911 or 988.   He reported understanding and had no concerns.  He was discharged home.   Physical Findings: AIMS:  , ,  ,  ,  ,  ,   CIWA:    COWS:     Musculoskeletal: Strength & Muscle Tone: within normal limits Gait & Station: normal Patient leans: N/A   Psychiatric Specialty Exam:  Presentation  General Appearance:  Appropriate for Environment  Eye Contact: Good  Speech: Clear and Coherent; Normal Rate  Speech Volume: Normal  Handedness: -- (not assessed)   Mood and Affect  Mood: -- (ok)  Affect: Appropriate; Congruent   Thought Process  Thought Processes: Coherent; Goal Directed  Descriptions of Associations:Intact  Orientation:Full (Time, Place and Person)  Thought Content:WDL; Logical  History of Schizophrenia/Schizoaffective disorder:No  Duration of Psychotic Symptoms:N/A  Hallucinations:Hallucinations: None  Ideas of Reference:None  Suicidal Thoughts:Suicidal Thoughts: No  Homicidal Thoughts:Homicidal Thoughts: No   Sensorium  Memory: Immediate Good; Recent Good  Judgment: Good  Insight: Good   Executive Functions  Concentration: Good  Attention Span: Good  Recall: Good  Fund of Knowledge: Good  Language: Good   Psychomotor Activity  Psychomotor Activity: Psychomotor Activity: Normal   Assets   Assets: Desire for Improvement; Resilience   Sleep  Sleep: Sleep: Fair  Estimated Sleeping Duration (Last 24 Hours): 6.75-7.75 hours   Physical Exam: Physical Exam Vitals and nursing note reviewed.  Constitutional:      General: He is not in acute distress.    Appearance: Normal appearance. He is normal weight. He is not ill-appearing or toxic-appearing.  HENT:     Head: Normocephalic and atraumatic.  Pulmonary:     Effort: Pulmonary effort is normal.  Musculoskeletal:        General: Normal range of motion.  Neurological:     General: No focal deficit present.     Mental Status: He is alert.    Review of Systems  Respiratory:  Negative for cough and shortness of breath.   Cardiovascular:  Negative for chest pain.  Gastrointestinal:  Negative for abdominal pain, constipation, diarrhea, nausea and vomiting.  Neurological:  Negative for dizziness, weakness and headaches.  Psychiatric/Behavioral:  Negative for depression, hallucinations and suicidal ideas. The patient is not nervous/anxious.    Blood pressure 121/84, pulse 64, temperature 97.9 F (36.6 C), resp. rate 16,  height 5' 7 (1.702 m), weight 52.2 kg, SpO2 100%. Body mass index is 18.01 kg/m.   Social History   Tobacco Use  Smoking Status Every Day   Types: Cigarettes  Smokeless Tobacco Never   Tobacco Cessation:  A prescription for an FDA-approved tobacco cessation medication provided at discharge   Blood Alcohol  level:  Lab Results  Component Value Date   [facility] <15 11/23/2023   ETH <10 06/29/2023    Metabolic Disorder Labs:  Lab Results  Component Value Date   HGBA1C 5.2 08/27/2014   MPG 103 08/27/2014   MPG 105 03/08/2013   No results found for: PROLACTIN No results found for: CHOL, TRIG, HDL, CHOLHDL, VLDL, LDLCALC  See Psychiatric Specialty Exam and Suicide Risk Assessment completed by Attending Physician prior to discharge.  Discharge destination:  Home  Is patient on  multiple antipsychotic therapies at discharge:  No   Has Patient had three or more failed trials of antipsychotic monotherapy by history:  No  Recommended Plan for Multiple Antipsychotic Therapies: NA  Discharge Instructions     Diet - low sodium heart healthy   Complete by: As directed    Increase activity slowly   Complete by: As directed       Allergies as of 11/28/2023       Reactions   Aspirin Hives, Nausea And Vomiting   Ibuprofen  Hives, Nausea And Vomiting   Pork-derived Products Other (See Comments)   Stomach pain. Pt does not eat pork   Acetaminophen  Nausea Only   Sertraline  Diarrhea, Other (See Comments)   Acute GI distress with diarrhea and pain.         Medication List     STOP taking these medications    baclofen  10 MG tablet Commonly known as: LIORESAL        TAKE these medications      Indication  DULoxetine  30 MG capsule Commonly known as: CYMBALTA  Take 1 capsule (30 mg total) by mouth daily. Start taking on: November 29, 2023  Indication: Major Depressive Disorder, Musculoskeletal Pain   naltrexone  50 MG tablet Commonly known as: DEPADE Take 0.5 tablets (25 mg total) by mouth at bedtime.  Indication: Abuse or Misuse of Alcohol    nicotine  14 mg/24hr patch Commonly known as: NICODERM CQ  - dosed in mg/24 hours Place 1 patch (14 mg total) onto the skin daily. Start taking on: November 29, 2023  Indication: Nicotine  Addiction        Follow-up Information     [facility] Follow up on 12/03/2023.   Why: You have a hospital follow up appointment for therapy and medication management services on  12/03/23 at 11:00 am.  This will be a Virtual telehealth appointment.  * Please call to cancel if you do not wish to attend. Contact information: [address]  [address] [place] [address] [phone]                 Follow-up recommendations/Comments:   Activity: as tolerated   Diet: heart healthy   Other: -Follow-up with your outpatient  psychiatric provider -instructions on appointment date, time, and address (location) are provided to you in discharge paperwork.   -Take your psychiatric medications as prescribed at discharge - instructions are provided to you in the discharge paperwork   -Follow-up with outpatient primary care doctor and other specialists -for management of chronic medical disease, including: Continued care your substance abuse.     -Testing: Follow-up with outpatient provider for abnormal lab results: None   -  Recommend abstinence from alcohol , tobacco, and other illicit drug use at discharge.    -If your psychiatric symptoms recur, worsen, or if you have side effects to your psychiatric medications, call your outpatient psychiatric provider, 911, 988 or go to the nearest emergency department.   -If suicidal thoughts recur, call your outpatient psychiatric provider, 911, 988 or go to the nearest emergency department.  Signed: Marsa GORMAN Rosser, DO 11/28/2023, 11:12 AM

## 2023-11-28 NOTE — Progress Notes (Signed)
  [facility] Adult Case Management Discharge Plan :  Will you be returning to the same living situation after discharge:  Yes,  Patient will return home. At discharge, do you have transportation home?: Yes, CSW to arrange taxi transport.  Do you have the ability to pay for your medications: Yes,  patient has insurance.   Release of information consent forms completed and in the chart;  Patient's signature needed at discharge.  Patient to Follow up at:  Follow-up Information     [facility] Follow up on 12/03/2023.   Why: You have a hospital follow up appointment for therapy and medication management services on  12/03/23 at 11:00 am.  This will be a Virtual telehealth appointment.  * Please call to cancel if you do not wish to attend. Contact information: [address]  [address] [place] [address] [phone]                 Next level of care provider has access to [facility] Link:no  Safety Planning and Suicide Prevention discussed: Yes,  CSW completed SPE with patient on 11/28/2023 as patient declined authorization to contact family/significant other.     Has patient been referred to the Quitline?: Patient refused referral for treatment  Patient has been referred for addiction treatment: Patient refused referral for treatment.  [name], LCSWA 11/28/2023, 1:07 PM

## 2023-11-28 NOTE — Plan of Care (Signed)

## 2023-11-28 NOTE — Plan of Care (Signed)

## 2023-11-28 NOTE — Group Note (Signed)
 Date:  11/28/2023 Time:  9:02 AM  Group Topic/Focus:  Goals Group:   The focus of this group is to help patients establish daily goals to achieve during treatment and discuss how the patient can incorporate goal setting into their daily lives to aide in recovery. Orientation:   The focus of this group is to educate the patient on the purpose and policies of crisis stabilization and provide a format to answer questions about their admission.  The group details unit policies and expectations of patients while admitted.    Participation Level:  Did Not Attend

## 2024-01-20 ENCOUNTER — Ambulatory Visit: Payer: MEDICAID | Admitting: Podiatry

## 2024-01-20 ENCOUNTER — Encounter: Payer: Self-pay | Admitting: Podiatry

## 2024-01-20 DIAGNOSIS — M79609 Pain in unspecified limb: Secondary | ICD-10-CM | POA: Diagnosis not present

## 2024-01-20 DIAGNOSIS — B351 Tinea unguium: Secondary | ICD-10-CM | POA: Diagnosis not present

## 2024-01-20 NOTE — Progress Notes (Signed)
 This patient presents to the office with chief complaint of long thick painful nails.  Patient says the nails are painful walking and wearing shoes.  This patient is unable to self treat.  This patient is unable to trim her nails since she is unable to reach her nails.  She presents to the office for preventative foot care services.  General Appearance  Alert, conversant and in no acute stress.  Vascular  Dorsalis pedis and posterior tibial  pulses are palpable  bilaterally.  Capillary return is within normal limits  bilaterally. Temperature is within normal limits  bilaterally.  Neurologic  Senn-Weinstein monofilament wire test within normal limits  bilaterally. Muscle power within normal limits bilaterally.  Nails Thick disfigured discolored nails with subungual debris  from hallux to fifth toes bilaterally. No evidence of bacterial infection or drainage bilaterally.  Orthopedic  No limitations of motion  feet .  No crepitus or effusions noted.  No bony pathology or digital deformities noted.  HAV  B/L.  Skin  normotropic skin with noted bilaterally.  No signs of infections or ulcers noted.   Callus noted sub 1 left foot  Onychomycosis  Nails  B/L.  Pain in right toes  Pain in left toes  Debridement of nails both feet followed trimming the nails with dremel tool.    RTC 3 months.   Cordella Bold DPM

## 2024-02-12 ENCOUNTER — Emergency Department (HOSPITAL_COMMUNITY)
Admission: EM | Admit: 2024-02-12 | Discharge: 2024-02-13 | Disposition: A | Discharge status: Home / Self Care | Payer: MEDICAID | Attending: Emergency Medicine | Admitting: Emergency Medicine

## 2024-02-12 ENCOUNTER — Emergency Department (HOSPITAL_COMMUNITY): Payer: MEDICAID

## 2024-02-12 ENCOUNTER — Other Ambulatory Visit: Payer: Self-pay

## 2024-02-12 ENCOUNTER — Encounter (HOSPITAL_COMMUNITY): Payer: Self-pay

## 2024-02-12 DIAGNOSIS — R059 Cough, unspecified: Secondary | ICD-10-CM | POA: Diagnosis present

## 2024-02-12 DIAGNOSIS — R509 Fever, unspecified: Secondary | ICD-10-CM

## 2024-02-12 DIAGNOSIS — J069 Acute upper respiratory infection, unspecified: Secondary | ICD-10-CM | POA: Insufficient documentation

## 2024-02-12 LAB — BASIC METABOLIC PANEL WITH GFR
Anion gap: 12 (ref 5–15)
BUN: 13 mg/dL (ref 6–20)
CO2: 24 mmol/L (ref 22–32)
Calcium: 9.5 mg/dL (ref 8.9–10.3)
Chloride: 99 mmol/L (ref 98–111)
Creatinine, Ser: 1.08 mg/dL (ref 0.61–1.24)
GFR, Estimated: >60 mL/min (ref >60)
Glucose, Bld: 122 mg/dL — ABNORMAL HIGH (ref 70–99)
Potassium: 4 mmol/L (ref 3.5–5.1)
Sodium: 135 mmol/L (ref 135–145)

## 2024-02-12 LAB — CBC
HCT: 43.7 % (ref 39.0–52.0)
Hemoglobin: 14.3 g/dL (ref 13.0–17.0)
MCH: 30.7 pg (ref 26.0–34.0)
MCHC: 32.7 g/dL (ref 30.0–36.0)
MCV: 93.8 fL (ref 80.0–100.0)
Platelets: 219 K/uL (ref 150–400)
RBC: 4.66 MIL/uL (ref 4.22–5.81)
RDW: 13.2 % (ref 11.5–15.5)
WBC: 16.2 K/uL — ABNORMAL HIGH (ref 4.0–10.5)
nRBC: 0 % (ref 0.0–0.2)

## 2024-02-12 LAB — RESP PANEL BY RT-PCR (RSV, FLU A&B, COVID)  RVPGX2
Influenza A by PCR: NEGATIVE
Influenza B by PCR: NEGATIVE
Resp Syncytial Virus by PCR: NEGATIVE
SARS Coronavirus 2 by RT PCR: NEGATIVE

## 2024-02-12 MED ORDER — ONDANSETRON 4 MG PO TBDP
4.0000 mg | ORAL_TABLET | Freq: Once | ORAL | Status: AC
  Administered 2024-02-12: 4 mg via ORAL
  Filled 2024-02-12: qty 1

## 2024-02-12 MED ORDER — ACETAMINOPHEN 325 MG PO TABS: 650.0000 mg | ORAL_TABLET | Freq: Once | ORAL | Status: DC

## 2024-02-12 MED ORDER — KETOROLAC TROMETHAMINE 15 MG/ML IJ SOLN
15.0000 mg | Freq: Once | INTRAMUSCULAR | Status: AC
  Administered 2024-02-12: 15 mg via INTRAMUSCULAR
  Filled 2024-02-12: qty 1

## 2024-02-12 NOTE — ED Notes (Signed)
 Pt allergic to both tylenol  and ibuprofen  therefore no meds given to treat fever

## 2024-02-12 NOTE — ED Notes (Signed)
 Pt maintained 95% on room air while ambulating however HR 120s.

## 2024-02-12 NOTE — ED Triage Notes (Signed)
 Pt bib ems c.o flu like symptoms- cough, fever, loss of taste and smell.  89% on room air. 2L [place] applied- 97%

## 2024-02-12 NOTE — ED Provider Triage Note (Signed)
 Emergency Medicine Provider Triage Evaluation Note  Joseh Sjogren , a 53 y.o. male  was evaluated in triage.  Pt complains of subjective fever, chills and URI symptoms. Notes associated cough. Denies known sick contacts. No hx of chronic O2 requirement.  Review of Systems  Positive: Cough, fever, chills, anorexia Negative: V/D  Physical Exam  BP 139/82   Pulse (!) 56   Temp (!) 101.5 F (38.6 C)   Resp 18   Ht 5' 7 (1.702 m)   Wt 63.5 kg   SpO2 96%   BMI 21.93 kg/m  Gen:   Awake, no distress   Resp:  Normal effort  MSK:   Moves extremities without difficulty  Other:  On 2L supplemental O2 via [place]  Medical Decision Making  Medically screening exam initiated at 10:19 PM.  Appropriate orders placed.  Alphonse Asbridge was informed that the remainder of the evaluation will be completed by another provider, this initial triage assessment does not replace that evaluation, and the importance of remaining in the ED until their evaluation is complete.  Given Toradol  for fever. Reported allergies to ibuprofen  and APAP, but has received Toradol  in the past (as recently as this year) without known issue.  Will need pulse ox trial on RA with ambulation. RVP appears negative with reassuring CXR. No acute respiratory distress noted. Nontoxic.    Keith Sor, PA-C 02/12/24 2223

## 2024-02-13 MED ORDER — ONDANSETRON 4 MG PO TBDP
4.0000 mg | ORAL_TABLET | Freq: Once | ORAL | Status: DC
  Filled 2024-02-13: qty 1

## 2024-02-13 MED ORDER — ACETAMINOPHEN 325 MG PO TABS: 650.0000 mg | ORAL_TABLET | Freq: Once | ORAL | Status: DC

## 2024-02-13 NOTE — ED Provider Notes (Signed)
 [place] EMERGENCY DEPARTMENT AT [facility] Provider Note   CSN: [id number] Arrival date & time: 02/12/24  2102     Patient presents with: Cough   [name] is a 53 y.o. male.   The history is provided by the patient.  Cough Cough characteristics:  Non-productive Severity:  Moderate Onset quality:  Gradual Duration: days. Chronicity:  New Context: upper respiratory infection   Relieved by:  Nothing Worsened by:  Nothing Ineffective treatments:  None tried Associated symptoms: fever, myalgias and sinus congestion   Associated symptoms: no chest pain, no diaphoresis, no rash, no shortness of breath, no sore throat and no wheezing   Risk factors: no recent travel        Prior to Admission medications   Medication Sig Start Date End Date Taking? Authorizing Provider  DULoxetine  (CYMBALTA ) 30 MG capsule Take 1 capsule (30 mg total) by mouth daily. 11/29/23   Pashayan, Marsa RAMAN, DO  naltrexone  (DEPADE) 50 MG tablet Take 0.5 tablets (25 mg total) by mouth at bedtime. 11/28/23   Pashayan, Marsa RAMAN, DO  nicotine  (NICODERM CQ  - DOSED IN MG/24 HOURS) 14 mg/24hr patch Place 1 patch (14 mg total) onto the skin daily. 11/29/23   Raliegh Marsa RAMAN, DO    Allergies: Aspirin, Ibuprofen , Porcine (pork) protein-containing drug products, Acetaminophen , and Sertraline     Review of Systems  Constitutional:  Positive for fever. Negative for diaphoresis.  HENT:  Negative for facial swelling, sore throat, tinnitus, trouble swallowing and voice change.   Respiratory:  Positive for cough. Negative for shortness of breath, wheezing and stridor.   Cardiovascular:  Negative for chest pain.  Musculoskeletal:  Positive for myalgias.  Skin:  Negative for rash.  All other systems reviewed and are negative.   Updated Vital Signs BP (!) 156/92 (BP Location: Left Wrist)   Pulse (!) 57   Temp (!) 101.1 F (38.4 C) (Oral)   Resp 18   Ht 5' 7 (1.702 m)   Wt 63.5 kg    SpO2 92%   BMI 21.93 kg/m   Physical Exam Vitals and nursing note reviewed.  Constitutional:      General: He is not in acute distress.    Appearance: He is well-developed. He is not diaphoretic.  HENT:     Head: Normocephalic and atraumatic.     Nose: Nose normal.     Mouth/Throat:     Mouth: Mucous membranes are moist.     Pharynx: Oropharynx is clear.  Eyes:     Conjunctiva/sclera: Conjunctivae normal.     Pupils: Pupils are equal, round, and reactive to light.  Cardiovascular:     Rate and Rhythm: Normal rate and regular rhythm.     Pulses: Normal pulses.     Heart sounds: Normal heart sounds.  Pulmonary:     Effort: Pulmonary effort is normal.     Breath sounds: Normal breath sounds. No wheezing or rales.  Abdominal:     General: Bowel sounds are normal.     Palpations: Abdomen is soft.     Tenderness: There is no abdominal tenderness. There is no guarding or rebound.  Musculoskeletal:        General: Normal range of motion.     Cervical back: Normal range of motion and neck supple.  Skin:    General: Skin is warm and dry.     Capillary Refill: Capillary refill takes less than 2 seconds.  Neurological:     General: No focal deficit present.  Mental Status: He is alert and oriented to person, place, and time.     Deep Tendon Reflexes: Reflexes normal.  Psychiatric:        Mood and Affect: Mood normal.     (all labs ordered are listed, but only abnormal results are displayed) Results for orders placed or performed during the hospital encounter of 02/12/24  Resp panel by RT-PCR (RSV, Flu A&B, Covid) Anterior Nasal Swab   Collection Time: 02/12/24  9:08 PM   Specimen: Anterior Nasal Swab  Result Value Ref Range   SARS Coronavirus 2 by RT PCR NEGATIVE NEGATIVE   Influenza A by PCR NEGATIVE NEGATIVE   Influenza B by PCR NEGATIVE NEGATIVE   Resp Syncytial Virus by PCR NEGATIVE NEGATIVE  Basic metabolic panel   Collection Time: 02/12/24  9:08 PM  Result Value  Ref Range   Sodium 135 135 - 145 mmol/L   Potassium 4.0 3.5 - 5.1 mmol/L   Chloride 99 98 - 111 mmol/L   CO2 24 22 - 32 mmol/L   Glucose, Bld 122 (H) 70 - 99 mg/dL   BUN 13 6 - 20 mg/dL   Creatinine, Ser 8.91 0.61 - 1.24 mg/dL   Calcium 9.5 8.9 - 89.6 mg/dL   GFR, Estimated >39 >39 mL/min   Anion gap 12 5 - 15  CBC   Collection Time: 02/12/24  9:08 PM  Result Value Ref Range   WBC 16.2 (H) 4.0 - 10.5 K/uL   RBC 4.66 4.22 - 5.81 MIL/uL   Hemoglobin 14.3 13.0 - 17.0 g/dL   HCT 56.2 60.9 - 47.9 %   MCV 93.8 80.0 - 100.0 fL   MCH 30.7 26.0 - 34.0 pg   MCHC 32.7 30.0 - 36.0 g/dL   RDW 86.7 88.4 - 84.4 %   Platelets 219 150 - 400 K/uL   nRBC 0.0 0.0 - 0.2 %   DG Chest 2 View Result Date: 02/12/2024 CLINICAL DATA:  Cough, congestion EXAM: CHEST - 2 VIEW COMPARISON:  06/06/2021 FINDINGS: The heart size and mediastinal contours are within normal limits. Both lungs are clear. The visualized skeletal structures are unremarkable. IMPRESSION: No active cardiopulmonary disease. Electronically Signed   By: Franky Crease M.D.   On: 02/12/2024 21:49     Radiology: DG Chest 2 View Result Date: 02/12/2024 CLINICAL DATA:  Cough, congestion EXAM: CHEST - 2 VIEW COMPARISON:  06/06/2021 FINDINGS: The heart size and mediastinal contours are within normal limits. Both lungs are clear. The visualized skeletal structures are unremarkable. IMPRESSION: No active cardiopulmonary disease. Electronically Signed   By: Franky Crease M.D.   On: 02/12/2024 21:49     Procedures   Medications Ordered in the ED  ondansetron  (ZOFRAN -ODT) disintegrating tablet 4 mg (4 mg Oral Not Given 02/13/24 0309)  acetaminophen  (TYLENOL ) tablet 650 mg (has no administration in time range)  ketorolac  (TORADOL ) 15 MG/ML injection 15 mg (15 mg Intramuscular Given 02/12/24 2221)  ondansetron  (ZOFRAN -ODT) disintegrating tablet 4 mg (4 mg Oral Given 02/12/24 2222)                                    Medical Decision Making URI  for several days but wants to eat here   Amount and/or Complexity of Data Reviewed Independent Historian: EMS    Details: See above  External Data Reviewed: notes.    Details: Previous visits reviewed  Labs: ordered.    Details:  Normal hemoglobin 14.3, normal platelets.  Negative covid and flu. Normal sodium 135, normal potassium 4  Radiology: ordered and independent interpretation performed.    Details: Normal CXR  Risk OTC drugs. Prescription drug management. Risk Details: Patient declined zofran  and tylenol .  Lungs are clear.  Patient is not septic.  Patient is very well appearing.  History and exam are consistent with viral illness.  Stable for discharge with close follow up.       Final diagnoses:  Upper respiratory tract infection, unspecified type  Febrile illness   No signs of systemic illness or infection. The patient is nontoxic-appearing on exam and vital signs are within normal limits.  I have reviewed the triage vital signs and the nursing notes. Pertinent labs & imaging results that were available during my care of the patient were reviewed by me and considered in my medical decision making (see chart for details). After history, exam, and medical workup I feel the patient has been appropriately medically screened and is safe for discharge home. Pertinent diagnoses were discussed with the patient. Patient was given return precautions.     ED Discharge Orders     None          Rainer Mounce, MD 02/13/24 [phone]

## 2024-02-13 NOTE — ED Notes (Signed)
 PT told present nurse: I don't want to take zofran , because I already took. PT also declined tylenol  for fever at this time. PT request to have food. Present nurse gave patient [place] sandwich and Ginger Ale. Pt tolerating Po intake

## 2024-02-15 ENCOUNTER — Other Ambulatory Visit: Payer: Self-pay

## 2024-02-15 ENCOUNTER — Observation Stay (HOSPITAL_COMMUNITY)
Admission: EM | Admit: 2024-02-15 | Discharge: 2024-02-17 | Disposition: A | Discharge status: Home / Self Care | Payer: MEDICAID | Attending: Internal Medicine | Admitting: Internal Medicine

## 2024-02-15 ENCOUNTER — Encounter (HOSPITAL_COMMUNITY): Payer: Self-pay | Admitting: Emergency Medicine

## 2024-02-15 DIAGNOSIS — M79604 Pain in right leg: Secondary | ICD-10-CM | POA: Diagnosis not present

## 2024-02-15 DIAGNOSIS — J189 Pneumonia, unspecified organism: Secondary | ICD-10-CM | POA: Insufficient documentation

## 2024-02-15 DIAGNOSIS — F1910 Other psychoactive substance abuse, uncomplicated: Secondary | ICD-10-CM | POA: Insufficient documentation

## 2024-02-15 DIAGNOSIS — F1290 Cannabis use, unspecified, uncomplicated: Secondary | ICD-10-CM | POA: Insufficient documentation

## 2024-02-15 DIAGNOSIS — R0902 Hypoxemia: Principal | ICD-10-CM

## 2024-02-15 DIAGNOSIS — R079 Chest pain, unspecified: Secondary | ICD-10-CM | POA: Diagnosis present

## 2024-02-15 DIAGNOSIS — A419 Sepsis, unspecified organism: Secondary | ICD-10-CM | POA: Diagnosis not present

## 2024-02-15 DIAGNOSIS — R599 Enlarged lymph nodes, unspecified: Secondary | ICD-10-CM | POA: Diagnosis not present

## 2024-02-15 DIAGNOSIS — F17210 Nicotine dependence, cigarettes, uncomplicated: Secondary | ICD-10-CM | POA: Diagnosis not present

## 2024-02-15 MED ORDER — ACETAMINOPHEN 325 MG PO TABS
650.0000 mg | ORAL_TABLET | Freq: Once | ORAL | Status: DC
  Filled 2024-02-15: qty 2

## 2024-02-15 MED ORDER — ALBUTEROL SULFATE HFA 108 (90 BASE) MCG/ACT IN AERS
2.0000 | INHALATION_SPRAY | Freq: Once | RESPIRATORY_TRACT | Status: AC
  Administered 2024-02-16: 2 via RESPIRATORY_TRACT
  Filled 2024-02-15: qty 6.7

## 2024-02-15 NOTE — ED Triage Notes (Signed)
 Chest pain with coughing, seen here last night and dx with URI, back pain, R leg pain, loss of taste, shortness of breath as well.   90% on RA now on 3 L [place] and 94% with EMS.

## 2024-02-15 NOTE — ED Provider Triage Note (Signed)
 Emergency Medicine Provider Triage Evaluation Note  Zohar Maroney , a 53 y.o. male  was evaluated in triage.  Pt complains of cough, shortness of breath, diffuse pain.  Review of Systems  Positive: Arthralgias, myalgias, back pain, cough, shortness of breath Negative: Nausea, vomiting, diarrhea  Physical Exam  BP 135/64 (BP Location: Right Arm)   Pulse (!) 44   Temp (!) 100.4 F (38 C)   Resp (!) 22   Ht 5' 7 (1.702 m)   Wt 63.5 kg   SpO2 94%   BMI 21.93 kg/m  Gen:   Awake, no distress   Resp:  Normal effort  MSK:   Moves extremities without difficulty   Medical Decision Making  Medically screening exam initiated at 11:54 PM.  Appropriate orders placed.  Tarence Searcy was informed that the remainder of the evaluation will be completed by another provider, this initial triage assessment does not replace that evaluation, and the importance of remaining in the ED until their evaluation is complete.  Patient presenting for cough, shortness of breath, and diffuse pain.  He was seen in the ED several days ago for similar symptoms.  Symptoms have worsened.  He has low-grade fever on arrival.  He has a slight end expiratory wheeze on lung auscultation.   Melvenia Motto, MD 02/15/24 [phone]

## 2024-02-16 ENCOUNTER — Emergency Department (HOSPITAL_COMMUNITY): Payer: MEDICAID

## 2024-02-16 ENCOUNTER — Inpatient Hospital Stay (HOSPITAL_COMMUNITY): Payer: MEDICAID

## 2024-02-16 DIAGNOSIS — A419 Sepsis, unspecified organism: Principal | ICD-10-CM

## 2024-02-16 DIAGNOSIS — J189 Pneumonia, unspecified organism: Secondary | ICD-10-CM

## 2024-02-16 DIAGNOSIS — F1910 Other psychoactive substance abuse, uncomplicated: Secondary | ICD-10-CM | POA: Diagnosis not present

## 2024-02-16 DIAGNOSIS — M79604 Pain in right leg: Secondary | ICD-10-CM

## 2024-02-16 DIAGNOSIS — M79661 Pain in right lower leg: Secondary | ICD-10-CM

## 2024-02-16 LAB — CBC WITH DIFFERENTIAL/PLATELET
Abs Immature Granulocytes: 0.09 K/uL — ABNORMAL HIGH (ref 0.00–0.07)
Basophils Absolute: 0 K/uL (ref 0.0–0.1)
Basophils Relative: 0 %
Eosinophils Absolute: 0.1 K/uL (ref 0.0–0.5)
Eosinophils Relative: 1 %
HCT: 43.8 % (ref 39.0–52.0)
Hemoglobin: 14 g/dL (ref 13.0–17.0)
Immature Granulocytes: 1 %
Lymphocytes Relative: 8 %
Lymphs Abs: 1.4 K/uL (ref 0.7–4.0)
MCH: 29.9 pg (ref 26.0–34.0)
MCHC: 32 g/dL (ref 30.0–36.0)
MCV: 93.6 fL (ref 80.0–100.0)
Monocytes Absolute: 1.5 K/uL — ABNORMAL HIGH (ref 0.1–1.0)
Monocytes Relative: 8 %
Neutro Abs: 14.9 K/uL — ABNORMAL HIGH (ref 1.7–7.7)
Neutrophils Relative %: 82 %
Platelets: 228 K/uL (ref 150–400)
RBC: 4.68 MIL/uL (ref 4.22–5.81)
RDW: 12.9 % (ref 11.5–15.5)
WBC: 18 K/uL — ABNORMAL HIGH (ref 4.0–10.5)
nRBC: 0 % (ref 0.0–0.2)

## 2024-02-16 LAB — COMPREHENSIVE METABOLIC PANEL WITH GFR
ALT: 15 U/L (ref 0–44)
AST: 23 U/L (ref 15–41)
Albumin: 4.2 g/dL (ref 3.5–5.0)
Alkaline Phosphatase: 55 U/L (ref 38–126)
Anion gap: 15 (ref 5–15)
BUN: 16 mg/dL (ref 6–20)
CO2: 24 mmol/L (ref 22–32)
Calcium: 9.7 mg/dL (ref 8.9–10.3)
Chloride: 98 mmol/L (ref 98–111)
Creatinine, Ser: 1.08 mg/dL (ref 0.61–1.24)
GFR, Estimated: >60 mL/min (ref >60)
Glucose, Bld: 132 mg/dL — ABNORMAL HIGH (ref 70–99)
Potassium: 3.9 mmol/L (ref 3.5–5.1)
Sodium: 137 mmol/L (ref 135–145)
Total Bilirubin: 1 mg/dL (ref 0.0–1.2)
Total Protein: 8 g/dL (ref 6.5–8.1)

## 2024-02-16 LAB — RAPID URINE DRUG SCREEN, HOSP PERFORMED
Amphetamines: NOT DETECTED
Barbiturates: NOT DETECTED
Benzodiazepines: NOT DETECTED
Cocaine: NOT DETECTED
Opiates: NOT DETECTED
Tetrahydrocannabinol: POSITIVE — AB

## 2024-02-16 LAB — RESP PANEL BY RT-PCR (RSV, FLU A&B, COVID)  RVPGX2
Influenza A by PCR: NEGATIVE
Influenza B by PCR: NEGATIVE
Resp Syncytial Virus by PCR: NEGATIVE
SARS Coronavirus 2 by RT PCR: NEGATIVE

## 2024-02-16 LAB — I-STAT CG4 LACTIC ACID, ED: Lactic Acid, Venous: 1 mmol/L (ref 0.5–1.9)

## 2024-02-16 LAB — TROPONIN I (HIGH SENSITIVITY): Troponin I (High Sensitivity): 14 ng/L (ref <18)

## 2024-02-16 LAB — PROCALCITONIN: Procalcitonin: 0.15 ng/mL

## 2024-02-16 MED ORDER — IOHEXOL 350 MG/ML SOLN
65.0000 mL | Freq: Once | INTRAVENOUS | Status: AC | PRN
  Administered 2024-02-16: 65 mL via INTRAVENOUS

## 2024-02-16 MED ORDER — ONDANSETRON HCL 4 MG/2ML IJ SOLN: 4.0000 mg | Freq: Four times a day (QID) | INTRAMUSCULAR | Status: DC | PRN

## 2024-02-16 MED ORDER — SODIUM CHLORIDE 0.9 % IV SOLN
1.0000 g | Freq: Once | INTRAVENOUS | Status: AC
  Administered 2024-02-16: 1 g via INTRAVENOUS
  Filled 2024-02-16: qty 10

## 2024-02-16 MED ORDER — HYDROCOD POLI-CHLORPHE POLI ER 10-8 MG/5ML PO SUER: 5.0000 mL | Freq: Two times a day (BID) | ORAL | Status: DC | PRN

## 2024-02-16 MED ORDER — GUAIFENESIN ER 600 MG PO TB12
600.0000 mg | ORAL_TABLET | Freq: Two times a day (BID) | ORAL | Status: DC
Start: 2024-02-16 — End: 2024-02-17
  Administered 2024-02-16 (×2): 600 mg via ORAL
  Filled 2024-02-16 (×5): qty 1

## 2024-02-16 MED ORDER — ACETAMINOPHEN 325 MG PO TABS
650.0000 mg | ORAL_TABLET | Freq: Four times a day (QID) | ORAL | Status: DC | PRN
  Filled 2024-02-16: qty 2

## 2024-02-16 MED ORDER — NICOTINE POLACRILEX 2 MG MT GUM
2.0000 mg | CHEWING_GUM | OROMUCOSAL | Status: DC | PRN
  Filled 2024-02-16: qty 1

## 2024-02-16 MED ORDER — ACETAMINOPHEN 650 MG RE SUPP: 650.0000 mg | Freq: Four times a day (QID) | RECTAL | Status: DC | PRN

## 2024-02-16 MED ORDER — SODIUM CHLORIDE 0.9 % IV BOLUS
1000.0000 mL | Freq: Once | INTRAVENOUS | Status: AC
  Administered 2024-02-16: 1000 mL via INTRAVENOUS

## 2024-02-16 MED ORDER — DOXYCYCLINE HYCLATE 100 MG PO TABS
100.0000 mg | ORAL_TABLET | Freq: Once | ORAL | Status: AC
Start: 2024-02-16 — End: 2024-02-16
  Administered 2024-02-16: 100 mg via ORAL
  Filled 2024-02-16: qty 1

## 2024-02-16 MED ORDER — CEFTRIAXONE SODIUM 1 G IJ SOLR: 1.0000 g | Freq: Once | INTRAMUSCULAR | Status: DC

## 2024-02-16 MED ORDER — OXYCODONE HCL 5 MG PO TABS: 5.0000 mg | ORAL_TABLET | Freq: Four times a day (QID) | ORAL | Status: DC | PRN

## 2024-02-16 MED ORDER — SODIUM CHLORIDE 0.9 % IV SOLN: 500.0000 mg | INTRAVENOUS | Status: DC

## 2024-02-16 MED ORDER — IPRATROPIUM BROMIDE 0.02 % IN SOLN: 0.5000 mg | Freq: Four times a day (QID) | RESPIRATORY_TRACT | Status: DC | PRN

## 2024-02-16 MED ORDER — SODIUM CHLORIDE 0.9 % IV SOLN: 500.0000 mg | Freq: Once | INTRAVENOUS | Status: DC

## 2024-02-16 MED ORDER — SODIUM CHLORIDE 0.9 % IV SOLN
2.0000 g | INTRAVENOUS | Status: DC
  Filled 2024-02-16: qty 20

## 2024-02-16 MED ORDER — SODIUM CHLORIDE 0.9 % IV SOLN
500.0000 mg | Freq: Once | INTRAVENOUS | Status: AC
  Administered 2024-02-16: 500 mg via INTRAVENOUS
  Filled 2024-02-16: qty 5

## 2024-02-16 MED ORDER — IPRATROPIUM-ALBUTEROL 0.5-2.5 (3) MG/3ML IN SOLN
3.0000 mL | Freq: Once | RESPIRATORY_TRACT | Status: AC
  Administered 2024-02-16: 3 mL via RESPIRATORY_TRACT
  Filled 2024-02-16: qty 3

## 2024-02-16 MED ORDER — ALBUTEROL SULFATE (2.5 MG/3ML) 0.083% IN NEBU: 2.5000 mg | INHALATION_SOLUTION | RESPIRATORY_TRACT | Status: DC | PRN

## 2024-02-16 MED ORDER — ONDANSETRON HCL 4 MG PO TABS: 4.0000 mg | ORAL_TABLET | Freq: Four times a day (QID) | ORAL | Status: DC | PRN

## 2024-02-16 MED ORDER — SODIUM CHLORIDE 0.9% FLUSH
3.0000 mL | Freq: Two times a day (BID) | INTRAVENOUS | Status: DC
  Administered 2024-02-16: 3 mL via INTRAVENOUS

## 2024-02-16 NOTE — ED Provider Notes (Signed)
 [name] EMERGENCY DEPARTMENT AT [facility] Provider Note   CSN: [id number] Arrival date & time: 02/15/24  2332     Patient presents with: Chest Pain   [name] is a 53 y.o. male with history of polysubstance abuse, MDD, alcohol  use disorder, back pain, hypertension, cannabis use disorder, hip pain.  Presents to ED complaining of chest pain with coughing, back pain, right leg pain related to his back pain, shortness of breath.  Patient arrived hypoxic with oxygen saturation 90% room air.  Noted to be febrile in triage to 100.4 F.  Reports several days of above-stated symptoms.  Reports was seen here last night and diagnosed with URI.  Returns this evening complaining of worsening chest pain and shortness of breath.  Denies fevers, endorsing decreased appetite.  Denies leg swelling.  Denies headache.  States he has chest pain on coughing.  Denies nausea or vomiting.  Denies abdominal pain.  Reports lightheadedness and dizziness on standing.   Chest Pain Associated symptoms: dizziness and shortness of breath        Prior to Admission medications   Medication Sig Start Date End Date Taking? Authorizing Provider  DULoxetine  (CYMBALTA ) 30 MG capsule Take 1 capsule (30 mg total) by mouth daily. Patient not taking: Reported on 02/16/2024 11/29/23   Raliegh Marsa RAMAN, DO  naltrexone  (DEPADE) 50 MG tablet Take 0.5 tablets (25 mg total) by mouth at bedtime. Patient not taking: Reported on 02/16/2024 11/28/23   Raliegh Marsa RAMAN, DO  nicotine  (NICODERM CQ  - DOSED IN MG/24 HOURS) 14 mg/24hr patch Place 1 patch (14 mg total) onto the skin daily. Patient not taking: Reported on 02/16/2024 11/29/23   Raliegh Marsa RAMAN, DO    Allergies: Aspirin, Ibuprofen , Porcine (pork) protein-containing drug products, Acetaminophen , and Sertraline     Review of Systems  Respiratory:  Positive for shortness of breath.   Cardiovascular:  Positive for chest pain.  Neurological:   Positive for dizziness.  All other systems reviewed and are negative.   Updated Vital Signs BP 130/76   Pulse 96   Temp (!) 100.7 F (38.2 C) (Oral)   Resp (!) 26   Ht 5' 7 (1.702 m)   Wt 63.5 kg   SpO2 94%   BMI 21.93 kg/m   Physical Exam Vitals and nursing note reviewed.  Constitutional:      General: He is not in acute distress.    Appearance: He is well-developed.  HENT:     Head: Normocephalic and atraumatic.  Eyes:     Conjunctiva/sclera: Conjunctivae normal.  Cardiovascular:     Rate and Rhythm: Normal rate and regular rhythm.     Heart sounds: No murmur heard. Pulmonary:     Effort: Pulmonary effort is normal. No respiratory distress.     Breath sounds: Rales present.  Abdominal:     Palpations: Abdomen is soft.     Tenderness: There is no abdominal tenderness.  Musculoskeletal:        General: No swelling.     Cervical back: Neck supple.  Skin:    General: Skin is warm and dry.     Capillary Refill: Capillary refill takes less than 2 seconds.  Neurological:     Mental Status: He is alert.  Psychiatric:        Mood and Affect: Mood normal.     (all labs ordered are listed, but only abnormal results are displayed) Labs Reviewed  CBC WITH DIFFERENTIAL/PLATELET - Abnormal; Notable for the following components:  Result Value   WBC 18.0 (*)    Neutro Abs 14.9 (*)    Monocytes Absolute 1.5 (*)    Abs Immature Granulocytes 0.09 (*)    All other components within normal limits  COMPREHENSIVE METABOLIC PANEL WITH GFR - Abnormal; Notable for the following components:   Glucose, Bld 132 (*)    All other components within normal limits  RESP PANEL BY RT-PCR (RSV, FLU A&B, COVID)  RVPGX2  CULTURE, BLOOD (ROUTINE X 2)  CULTURE, BLOOD (ROUTINE X 2)  PROCALCITONIN  I-STAT CG4 LACTIC ACID, ED  TROPONIN I (HIGH SENSITIVITY)    EKG: None  Radiology: CT Angio Chest PE W and/or Wo Contrast Result Date: 02/16/2024 EXAM: CTA CHEST 02/16/2024 04:06:48 AM  TECHNIQUE: CTA of the chest was performed without and with the administration of 65 mL of iohexol  (OMNIPAQUE ) 350 MG/ML injection. Multiplanar reformatted images are provided for review. MIP images are provided for review. Automated exposure control, iterative reconstruction, and/or weight based adjustment of the mA/kV was utilized to reduce the radiation dose to as low as reasonably achievable. COMPARISON: AP and lateral chest series from today, AP and lateral chest 02/12/2024, PA chest 06/06/2021, and CTA chest 04/30/2017. CLINICAL HISTORY: Pulmonary embolism (PE) suspected, high prob. 65cc [id number]; Pt complains of cough- productive with yellow mucous, fevers, body aches. Onset 4 days ago. Daily smoker, no hx of COPD/asthma. FINDINGS: PULMONARY ARTERIES: Pulmonary arteries are adequately opacified for evaluation. No acute pulmonary embolus. Main pulmonary artery is normal in caliber. MEDIASTINUM: The heart and pericardium demonstrate no acute abnormality. There is no acute abnormality of the thoracic aorta. The trachea, both main bronchi, and thoracic esophagus are unremarkable. LYMPH NODES: Mildly prominent right hilar lymph nodes up to 1.2 cm in short axis. Subcarinal lymph nodes up to 1.4 cm. These lymph nodes are probably reactive. No other adenopathy is seen, including both axillae. LUNGS AND PLEURA: Respiratory motion limits fine detail in the lungs. There are minor changes of paraseptal emphysema in the lung apices. There is diffuse bronchial wall thickening. There is peribronchovascular hazy as well as nodular airspace disease in the right upper lobe, the left upper lobe lingula, in the infrahilar areas of the right middle lobe and in a patchy distribution in the basal segments of both of the lower lobes. The remaining lungs are clear. There is no pleural effusion, thickening, or pneumothorax. UPPER ABDOMEN: Limited images of the upper abdomen are unremarkable. SOFT TISSUES AND BONES: No acute bone or soft  tissue abnormality. IMPRESSION: 1. No pulmonary embolism or  arterial dilatation is seen . 2. Multifocal peribronchovascular hazy and nodular airspace disease involving the right upper lobe, lingula, right middle lobe infrahilar region, and basal segments of both lower lobes, compatible with multifocal infectious/inflammatory process such as bronchopneumonia. A follow-up chest CT is recommended after treatment to ensure clearing . 3. Mildly enlarged right hilar and subcarinal lymph nodes, likely reactive. Electronically signed by: Francis Quam MD 02/16/2024 04:44 AM EDT RP Workstation: [id number]   DG Chest 2 View Result Date: 02/16/2024 CLINICAL DATA:  Cough EXAM: CHEST - 2 VIEW COMPARISON:  02/12/2024 FINDINGS: Heart mediastinal contours are within normal limits. Right lung clear. Left lower lobe airspace opacity concerning for pneumonia. No effusions. No acute bony abnormality. IMPRESSION: Left lower lobe opacity concerning for pneumonia. Electronically Signed   By: Franky Crease M.D.   On: 02/16/2024 00:29    .Critical Care  Performed by: Ruthell Lonni FALCON, PA-C Authorized by: Ruthell Lonni FALCON, PA-C   Critical  care provider statement:    Critical care time (minutes):  45   Critical care time was exclusive of:  Separately billable procedures and treating other patients   Critical care was necessary to treat or prevent imminent or life-threatening deterioration of the following conditions:  Respiratory failure   Critical care was time spent personally by me on the following activities:  Blood draw for specimens, development of treatment plan with patient or surrogate, discussions with consultants, discussions with primary provider, obtaining history from patient or surrogate, interpretation of cardiac output measurements, examination of patient, evaluation of patient's response to treatment, ordering and performing treatments and interventions, ordering and review of laboratory studies,  ordering and review of radiographic studies, pulse oximetry, re-evaluation of patient's condition and review of old charts   I assumed direction of critical care for this patient from another provider in my specialty: no     Care discussed with: admitting provider      Medications Ordered in the ED  acetaminophen  (TYLENOL ) tablet 650 mg (650 mg Oral Patient Refused/Not Given 02/16/24 0009)  azithromycin (ZITHROMAX) 500 mg in sodium chloride  0.9 % 250 mL IVPB (has no administration in time range)  ipratropium (ATROVENT) nebulizer solution 0.5 mg (has no administration in time range)  albuterol  (VENTOLIN  HFA) 108 (90 Base) MCG/ACT inhaler 2 puff (2 puffs Inhalation Given 02/16/24 0006)  ipratropium-albuterol  (DUONEB) 0.5-2.5 (3) MG/3ML nebulizer solution 3 mL (3 mLs Nebulization Given 02/16/24 0341)  doxycycline  (VIBRA -TABS) tablet 100 mg (100 mg Oral Given 02/16/24 0335)  sodium chloride  0.9 % bolus 1,000 mL (1,000 mLs Intravenous New Bag/Given 02/16/24 0335)  cefTRIAXone (ROCEPHIN) 1 g in sodium chloride  0.9 % 100 mL IVPB (0 g Intravenous Stopped 02/16/24 0530)  iohexol  (OMNIPAQUE ) 350 MG/ML injection 65 mL (65 mLs Intravenous Contrast Given 02/16/24 0407)    Medical Decision Making Amount and/or Complexity of Data Reviewed Radiology: ordered.  Risk Prescription drug management.   This is a 53 year old presenting to the ED due to concern of shortness of breath.  On exam, hypoxic down to 94% room air.  When ambulates drops to 88% room air.  Febrile to 100.7.  Tachycardic.  Lung sounds have Rales, patient toxic on room air.  Abdomen soft and compressible.  Neurological examination baseline.  Patient metabolic panel with leukocytosis to 18, no anemia.  Metabolic panel gross unremarkable.  Viral panel negative for all.  Lactic not elevated at 1.  Procalcitonin 0.15.  Troponin is 14, EKG nonischemic.  Chest x-ray shows concerns for pneumonia.  CTA collected due to tachycardia.  CT reveals  multifocal peribronchovascular pneumonia.  Patient started on Rocephin, azithromycin.  Patient admitted to the hospital at this time due to hypoxia in the setting of pneumonia.  Discussed with Dr. Sundil, TRH, who agrees to admit patient.  Patient amenable plan.  Stable on admission.   Final diagnoses:  Hypoxia  Pneumonia of right lung due to infectious organism, unspecified part of lung    ED Discharge Orders     None          Ruthell Lonni FALCON, PA-C 02/16/24 0602    Trine Raynell Moder, MD 02/16/24 [phone]

## 2024-02-16 NOTE — Progress Notes (Signed)
 Patient refusing to have telemetry. He also is refusing to take acetaminophen  or ibuprofen , he is with elevated temperature. Provider is aware.

## 2024-02-16 NOTE — ED Provider Triage Note (Signed)
 Emergency Medicine Provider Triage Evaluation Note  [name] , a 53 y.o. male  was evaluated in triage.  Pt complains of cough- productive with yellow mucous, fevers, body aches. Onset 4 days ago.  Daily smoker, no hx of COPD/asthma.  Review of Systems  Positive:  Negative:   Physical Exam  BP 139/70   Pulse 90   Temp (!) 100.4 F (38 C)   Resp 20   Ht 5' 7 (1.702 m)   Wt 63.5 kg   SpO2 95%   BMI 21.93 kg/m  Gen:   Awake, no distress, appears uncomfortable but not toxic   Resp:  Normal effort, lung sounds diminished in bases   MSK:   Moves extremities without difficulty  Other:    Medical Decision Making  Medically screening exam initiated at 2:43 AM.  Appropriate orders placed.  [name] was informed that the remainder of the evaluation will be completed by another provider, this initial triage assessment does not replace that evaluation, and the importance of remaining in the ED until their evaluation is complete.  Seen here 02/12/24 for same. WBC elevated, CXR normal. Given albuterol  inhaler on arrival.  On recheck, HR elevated to 130s with short walk, O2 sats normal. Given elevated HR, labs, fluids ordered.    Beverley Leita LABOR, PA-C 02/16/24 [phone]

## 2024-02-16 NOTE — Progress Notes (Addendum)
 This nurse was in another patient's room when was informed by the charge nurse that she went to see this patient, [name], and noticed that the room was empty. The patient was no where to be found, he removed his telemetry box and his IV. The patient just left the unit without informing anybody. Security was called, the patient was also called but no answer. Dr. Shona was notified of the situation, no further orders were obtained.   Update: The patient just returned to the unit wearing regular clothes. He passed the nurse's station and went inside his room without telling anyone. The charge nurse contacted the house supervisor who asked to contact the provider to see what should with this patient. The provider was contacted again to let her be aware that the patient returned, and a UDS was ordered. Patient was educated of the hospitals rule and was told to notify the staff if he needs to leave. Will continue to monitor.

## 2024-02-16 NOTE — ED Notes (Signed)
 Patient seen walking out of the emergency room lobby with PIV and was seen up at the road. Patient came back 5-10 minutes later to go back to his room. Primary RN notified that patient is coming and going in and out of the ED.

## 2024-02-16 NOTE — ED Notes (Addendum)
 Pt found to have left room dressed, security alerted and MD made aware of possible ama. Pt later returned with food left at front under no name.

## 2024-02-16 NOTE — Hospital Course (Signed)
  53 year old man past medical history of ADHD, psychosis, cannabis use disorder, tobacco use disorder, alcohol  use disorder, and major depressive disorder presented emergency department chest pain with coughing, back pain, right leg pain related to his back pain and shortness of breath.    At presentation to ED patient O2 sat dropped 88 to 90% room air, noted to be febrile.  Otherwise hemodynamically stable.  Patient reported he has recent diagnosis of URI however complaining of no chest pain, shortness of breath dizziness and lightheadedness. Lab, CBC showed leukocytosis 18.  CMP unremarkable. normal lactic acid. Respiratory panel negative for COVID RSV flu.  Normal troponin.  Blood cultures are in process. EKG shows sinus tachycardia heart rate 105. CTA chest no evidence of PE it showed multifocal pneumonia.  Chest x-ray showed pneumonia.  In the ED patient received azithromycin and ceftriaxone and doxycycline .  During nebulizer.  1 L of NS bolus.  Hospitalist consulted for further emergent management of acute hypoxic respiratory failure in the setting of multifocal pneumonia.

## 2024-02-16 NOTE — ED Notes (Signed)
 Pt ambulated approximately 30 ft while oxygen saturation and HR monitored, pt was not able to walk any farther stating that he was feeling extremely dizzy and was seeing two of everything. Pt O2 sat did not drop below 95% but HR increased to 130 bpm. Pt ambulated back to triage room 6 and was given two warm blankets and a sandwich. PA made aware.

## 2024-02-16 NOTE — Progress Notes (Signed)
 Ordered urine sample was collected and sent to the lab. Provider was notified.

## 2024-02-16 NOTE — ED Notes (Signed)
 Pt has refused being moved to a new room multiple times upset about provided breakfast insisting that he not be moved till ordered food arrives and yelling at staff.

## 2024-02-16 NOTE — Progress Notes (Signed)
 Bilateral lower extremity venous duplex has been completed.  Results can be found in chart review under CV Proc.  02/16/2024 2:26 PM  [employer], RVT.

## 2024-02-16 NOTE — H&P (Addendum)
 History and Physical    Patient: [name] FMW:[id number] DOB: [birth year] DOA: 02/15/2024 DOS: the patient was seen and examined on 02/16/2024 PCP: Patient, No Pcp Per  Patient coming from: Home  Chief Complaint:  Chief Complaint  Patient presents with   Chest Pain   HPI: [name] is a 53 y.o. male with medical history significant of depression, anemia, and polysubstance abuse presented with complaints of cough, right leg pain, chest pain, and cold sweats.  He has been experiencing significant right leg pain for several days.  The chest pain is diffuse and migratory, not localized to one area, and has been present for several days. There is no associated nausea, vomiting, or stomach pain. He also reports an inability to taste food, which has been ongoing for the same duration.  He has been coughing up mucus for several days but denies any blood in the sputum.  He smokes both weed and cigarettes, with a pack of cigarettes lasting a couple of days. He denies any recent use of alcohol .  In the emergency department patient was noted to be febrile up to 100.7 F with tachypnea, and O2 saturations currently maintained on room air.  Labs significant for WBC 18, lactic acid 1,  procalcitonin 0.15, and high-sensitivity troponin 14.  Influenza, COVID-19, and RSV screening were negative.  Chest x-ray noted left lower lobe opacity concerning for pneumonia.  CTA of the chest have been obtained which noted a multifocal peribronchovascular hazy and nodular airspace disease concerning for multifocal pneumonia with no signs of a pulmonary embolism.  Blood cultures have been obtained.  Patient had been given 1 L normal saline IV fluids, DuoNeb breathing treatment.  Acetaminophen , Rocephin, and doxycycline .  Review of Systems: As mentioned in the history of present illness. All other systems reviewed and are negative. Past Medical History:  Diagnosis Date   Alcohol  abuse    Anemia     Colitis    Severe major depression with psychotic features (HCC)    History reviewed. No pertinent surgical history. Social History:  reports that he has been smoking cigarettes. He has never used smokeless tobacco. He reports current drug use. Drug: Marijuana. He reports that he does not drink alcohol .  Allergies  Allergen Reactions   Aspirin Hives and Nausea And Vomiting   Ibuprofen  Hives and Nausea And Vomiting   Porcine (Pork) Protein-Containing Drug Products Other (See Comments)    Stomach pain. Pt does not eat pork   Acetaminophen  Nausea Only   Sertraline  Diarrhea and Other (See Comments)    Acute GI distress with diarrhea and pain.     Family History  Problem Relation Age of Onset   Hypertension Mother    Hypertension Father    Hypertension Other     Prior to Admission medications   Medication Sig Start Date End Date Taking? Authorizing Provider  DULoxetine  (CYMBALTA ) 30 MG capsule Take 1 capsule (30 mg total) by mouth daily. Patient not taking: Reported on 02/16/2024 11/29/23   Raliegh Marsa RAMAN, DO  naltrexone  (DEPADE) 50 MG tablet Take 0.5 tablets (25 mg total) by mouth at bedtime. Patient not taking: Reported on 02/16/2024 11/28/23   Raliegh Marsa RAMAN, DO  nicotine  (NICODERM CQ  - DOSED IN MG/24 HOURS) 14 mg/24hr patch Place 1 patch (14 mg total) onto the skin daily. Patient not taking: Reported on 02/16/2024 11/29/23   Raliegh Marsa RAMAN, DO    Physical Exam: Vitals:   02/15/24 2354 02/16/24 0005 02/16/24 0342 02/16/24 0500  BP:  139/70  130/76  Pulse: (!) 101 90 (!) 101 96  Resp: 20 20 (!) 22 (!) 26  Temp:    (!) 100.7 F (38.2 C)  TempSrc:    Oral  SpO2: 96% 95% 96% 94%  Weight:      Height:        Constitutional: middle age man who appears ill but in no acute distress Eyes: PERRL, lids and conjunctivae normal ENMT: Mucous membranes are moist. Posterior pharynx clear of any exudate or lesions.  Neck: normal, supple,   Respiratory: Decreased  aeration with coarse breath sounds intermittent rhonchi appreciated most notably in the right lung field. Cardiovascular: Regular rate and rhythm, no murmurs / rubs / gallops. No extremity edema. 2+ pedal pulses. No carotid bruits.  Abdomen: no tenderness, no masses palpated.  Bowel sounds positive.  Musculoskeletal: no clubbing / cyanosis. No joint deformity upper and lower extremities. Good ROM, no contractures. Normal muscle tone.  Skin: no rashes, lesions, ulcers. No induration Neurologic: CN 2-12 grossly intact. Strength 5/5 in all 4.  Psychiatric: Normal judgment and insight. Alert and oriented x 3. Normal mood.   Data Reviewed:  EKG reveals sinus tachycardia at 106 bpm.  Reviewed labs, imaging, and pertinent records as documented.  Assessment and Plan:  Sepsis secondary to pneumonia Patient presented with fever 100.7 F with tachypnea and leukocytosis meeting SIRS criteria.  Lactic acid was reassuring at 1.  Chest x-ray and noted concern for left lower lobe pneumonia.  CTA of the chest obtained noting concern for multifocal pneumonia.  Influenza, COVID-19, and RSV screening were negative.  Blood cultures were obtained.  Patient was started on empiric antibiotics of Rocephin and doxycycline  - Admit to a telemetry bed - Incentive spirometry and flutter valve - Follow-up blood and sputum (if able to be obtained) cultures - Follow-up urine Legionella and strep - Continue empiric antibiotics of Rocephin and azithromycin - Mucinex - Tylenol  as needed for fever - Recheck CBC tomorrow morning  Right leg pain Patient complains of severe pain in the right lower extremity. - Check Doppler ultrasound of lower extremities  Polysubstance abuse Patient reports use of marijuana and tobacco use, but denies any recent alcohol  use. - Counseled on the need of cessation of tobacco and marijuana   - Nicorette  gum offered help with smoking cessation  DVT prophylaxis: SCDs due to reported  allergy Advance Care Planning:   Code Status: Full Code   Consults:   Family Communication: Daughter, updated over the phone  Severity of Illness: The appropriate patient status for this patient is INPATIENT. Inpatient status is judged to be reasonable and necessary in order to provide the required intensity of service to ensure the patient's safety. The patient's presenting symptoms, physical exam findings, and initial radiographic and laboratory data in the context of their chronic comorbidities is felt to place them at high risk for further clinical deterioration. Furthermore, it is not anticipated that the patient will be medically stable for discharge from the hospital within 2 midnights of admission.   * I certify that at the point of admission it is my clinical judgment that the patient will require inpatient hospital care spanning beyond 2 midnights from the point of admission due to high intensity of service, high risk for further deterioration and high frequency of surveillance required.*  Author: Maximino DELENA Sharps, MD 02/16/2024 7:40 AM  For on call review [email or web address].

## 2024-02-16 NOTE — Plan of Care (Signed)

## 2024-02-17 ENCOUNTER — Other Ambulatory Visit (HOSPITAL_COMMUNITY): Payer: Self-pay

## 2024-02-17 DIAGNOSIS — A419 Sepsis, unspecified organism: Secondary | ICD-10-CM | POA: Diagnosis not present

## 2024-02-17 DIAGNOSIS — F1910 Other psychoactive substance abuse, uncomplicated: Secondary | ICD-10-CM | POA: Diagnosis not present

## 2024-02-17 DIAGNOSIS — F17200 Nicotine dependence, unspecified, uncomplicated: Secondary | ICD-10-CM | POA: Diagnosis not present

## 2024-02-17 DIAGNOSIS — J189 Pneumonia, unspecified organism: Secondary | ICD-10-CM | POA: Diagnosis not present

## 2024-02-17 LAB — BASIC METABOLIC PANEL WITH GFR
Anion gap: 9 (ref 5–15)
BUN: 10 mg/dL (ref 6–20)
CO2: 26 mmol/L (ref 22–32)
Calcium: 9 mg/dL (ref 8.9–10.3)
Chloride: 105 mmol/L (ref 98–111)
Creatinine, Ser: 0.81 mg/dL (ref 0.61–1.24)
GFR, Estimated: >60 mL/min
Glucose, Bld: 97 mg/dL (ref 70–99)
Potassium: 3.6 mmol/L (ref 3.5–5.1)
Sodium: 140 mmol/L (ref 135–145)

## 2024-02-17 LAB — CBC
HCT: 37.4 % — ABNORMAL LOW (ref 39.0–52.0)
Hemoglobin: 12.2 g/dL — ABNORMAL LOW (ref 13.0–17.0)
MCH: 30.4 pg (ref 26.0–34.0)
MCHC: 32.6 g/dL (ref 30.0–36.0)
MCV: 93.3 fL (ref 80.0–100.0)
Platelets: 224 K/uL (ref 150–400)
RBC: 4.01 MIL/uL — ABNORMAL LOW (ref 4.22–5.81)
RDW: 12.7 % (ref 11.5–15.5)
WBC: 14.2 K/uL — ABNORMAL HIGH (ref 4.0–10.5)
nRBC: 0 % (ref 0.0–0.2)

## 2024-02-17 LAB — HIV ANTIBODY (ROUTINE TESTING W REFLEX): HIV Screen 4th Generation wRfx: NONREACTIVE

## 2024-02-17 MED ORDER — NICOTINE POLACRILEX 2 MG MT GUM
2.0000 mg | CHEWING_GUM | OROMUCOSAL | 0 refills | Status: DC | PRN
  Filled 2024-02-17: qty 100, fill #0

## 2024-02-17 MED ORDER — ONDANSETRON HCL 4 MG PO TABS
4.0000 mg | ORAL_TABLET | Freq: Four times a day (QID) | ORAL | 0 refills | Status: DC | PRN
  Filled 2024-02-17: qty 20, 5d supply, fill #0

## 2024-02-17 MED ORDER — AMOXICILLIN-POT CLAVULANATE 875-125 MG PO TABS
1.0000 | ORAL_TABLET | Freq: Two times a day (BID) | ORAL | 0 refills | Status: AC
Start: 2024-02-17 — End: 2024-02-20
  Filled 2024-02-17: qty 6, 3d supply, fill #0

## 2024-02-17 MED ORDER — AZITHROMYCIN 500 MG PO TABS
500.0000 mg | ORAL_TABLET | Freq: Every day | ORAL | 0 refills | Status: AC
  Filled 2024-02-17: qty 3, 3d supply, fill #0

## 2024-02-17 NOTE — Plan of Care (Signed)
  Problem: Education: Goal: Knowledge of General Education information will improve Description: Including pain rating scale, medication(s)/side effects and non-pharmacologic comfort measures 02/17/2024 0050 by Evern Monica HERO, RN Outcome: Progressing 02/16/2024 2132 by Evern Monica HERO, RN Outcome: Progressing   Problem: Health Behavior/Discharge Planning: Goal: Ability to manage health-related needs will improve 02/17/2024 0050 by Evern Monica HERO, RN Outcome: Progressing 02/16/2024 2132 by Evern Monica HERO, RN Outcome: Progressing   Problem: Clinical Measurements: Goal: Ability to maintain clinical measurements within normal limits will improve 02/17/2024 0050 by Evern Monica HERO, RN Outcome: Progressing 02/16/2024 2132 by Evern Monica HERO, RN Outcome: Progressing Goal: Will remain free from infection 02/17/2024 0050 by Evern Monica HERO, RN Outcome: Progressing 02/16/2024 2132 by Evern Monica HERO, RN Outcome: Progressing Goal: Diagnostic test results will improve 02/17/2024 0050 by Evern Monica HERO, RN Outcome: Progressing 02/16/2024 2132 by Evern Monica HERO, RN Outcome: Progressing Goal: Respiratory complications will improve 02/17/2024 0050 by Evern Monica HERO, RN Outcome: Progressing 02/16/2024 2132 by Evern Monica HERO, RN Outcome: Progressing Goal: Cardiovascular complication will be avoided 02/17/2024 0050 by Evern Monica HERO, RN Outcome: Progressing 02/16/2024 2132 by Evern Monica HERO, RN Outcome: Progressing   Problem: Activity: Goal: Risk for activity intolerance will decrease 02/17/2024 0050 by Evern Monica HERO, RN Outcome: Progressing 02/16/2024 2132 by Evern Monica HERO, RN Outcome: Progressing   Problem: Nutrition: Goal: Adequate nutrition will be maintained 02/17/2024 0050 by Evern Monica HERO, RN Outcome: Progressing 02/16/2024 2132 by Evern Monica HERO, RN Outcome: Progressing   Problem: Coping: Goal: Level of anxiety will  decrease 02/17/2024 0050 by Evern Monica HERO, RN Outcome: Progressing 02/16/2024 2132 by Evern Monica HERO, RN Outcome: Progressing   Problem: Elimination: Goal: Will not experience complications related to bowel motility 02/17/2024 0050 by Evern Monica HERO, RN Outcome: Progressing 02/16/2024 2132 by Evern Monica HERO, RN Outcome: Progressing Goal: Will not experience complications related to urinary retention 02/17/2024 0050 by Evern Monica HERO, RN Outcome: Progressing 02/16/2024 2132 by Evern Monica HERO, RN Outcome: Progressing   Problem: Pain Managment: Goal: General experience of comfort will improve and/or be controlled 02/17/2024 0050 by Evern Monica HERO, RN Outcome: Progressing 02/16/2024 2132 by Evern Monica HERO, RN Outcome: Progressing   Problem: Safety: Goal: Ability to remain free from injury will improve 02/17/2024 0050 by Evern Monica HERO, RN Outcome: Progressing 02/16/2024 2132 by Evern Monica HERO, RN Outcome: Progressing   Problem: Skin Integrity: Goal: Risk for impaired skin integrity will decrease 02/17/2024 0050 by Evern Monica HERO, RN Outcome: Progressing 02/16/2024 2132 by Evern Monica HERO, RN Outcome: Progressing   Problem: Activity: Goal: Ability to tolerate increased activity will improve 02/17/2024 0050 by Evern Monica HERO, RN Outcome: Progressing 02/16/2024 2132 by Evern Monica HERO, RN Outcome: Progressing   Problem: Clinical Measurements: Goal: Ability to maintain a body temperature in the normal range will improve 02/17/2024 0050 by Evern Monica HERO, RN Outcome: Progressing 02/16/2024 2132 by Evern Monica HERO, RN Outcome: Progressing   Problem: Respiratory: Goal: Ability to maintain adequate ventilation will improve 02/17/2024 0050 by Evern Monica HERO, RN Outcome: Progressing 02/16/2024 2132 by Evern Monica HERO, RN Outcome: Progressing Goal: Ability to maintain a clear airway will improve 02/17/2024 0050 by  Evern Monica HERO, RN Outcome: Progressing 02/16/2024 2132 by Evern Monica HERO, RN Outcome: Progressing

## 2024-02-17 NOTE — Discharge Summary (Signed)
 Physician Discharge Summary   Patient: [name] MRN: [id number] DOB: Dec 05, 1970  Admit date:     02/15/2024  Discharge date: 02/17/24  Discharge Physician: Carliss LELON Canales   PCP: Patient, No Pcp Per   Recommendations at discharge:    Pt to be discharged home.   If you experience worsening fever, chills, chest pain, shortness of breath, or other concerning symptoms, please call your PCP or go to the emergency department immediately.  Discharge Diagnoses: Principal Problem:   Sepsis due to pneumonia [facility]) Active Problems:   Right leg pain   Polysubstance abuse (HCC)  Resolved Problems:   * No resolved hospital problems. *   Hospital Course:  53 y.o. male with medical history significant of depression, anemia, and polysubstance abuse presented with complaints of cough, right leg pain, chest pain, and cold sweats.   He has been experiencing significant right leg pain for several days.   The chest pain is diffuse and migratory, not localized to one area, and has been present for several days. There is no associated nausea, vomiting, or stomach pain. He also reports an inability to taste food, which has been ongoing for the same duration.   He has been coughing up mucus for several days but denies any blood in the sputum.   He smokes both weed and cigarettes, with a pack of cigarettes lasting a couple of days. He denies any recent use of alcohol .   In the emergency department patient was noted to be febrile up to 100.7 F with tachypnea, and O2 saturations currently maintained on room air.  Labs significant for WBC 18, lactic acid 1,  procalcitonin 0.15, and high-sensitivity troponin 14.  Influenza, COVID-19, and RSV screening were negative.  Chest x-ray noted left lower lobe opacity concerning for pneumonia.  CTA of the chest have been obtained which noted a multifocal peribronchovascular hazy and nodular airspace disease concerning for multifocal pneumonia with no signs of a  pulmonary embolism.  Blood cultures have been obtained.  Patient had been given 1 L normal saline IV fluids, DuoNeb breathing treatment.  Acetaminophen , Rocephin, and doxycycline .  Assessment and Plan:  Concern for sepsis - Fever, tachypnea, leukocytosis, with multifocal pneumonia given concern for sepsis on presentation.  Blood cultures, IV fluid bolus, empiric antibiotics on board.  Appears resolved.  Multifocal pneumonia - Fever, shortness of breath, chest x-ray noting bilateral patchiness.  Rapid flu/COVID/RSV negative.  Initiated on empiric IV Rocephin plus azithromycin.  Patient feeling improved, ambulatory, eager for discharge home.  Will transition patient over to p.o. azithromycin plus Augmentin  to take as directed upon discharge.  Can use home inhaler as needed as well as OTC cough medicine.  Tobacco abuse - Nicorette  gum provided upon discharge to help with smoking cessation.  Encourage cessation.    Consultants: None Procedures performed: None Disposition: Home Diet recommendation:  Discharge Diet Orders (From admission, onward)     Start     Ordered   02/17/24 0000  Diet - low sodium heart healthy        02/17/24 1033           Regular diet  DISCHARGE MEDICATION: Allergies as of 02/17/2024       Reactions   Aspirin Hives, Nausea And Vomiting   Ibuprofen  Hives, Nausea And Vomiting   Porcine (pork) Protein-containing Drug Products Other (See Comments)   Stomach pain. Pt does not eat pork   Acetaminophen  Nausea Only   Sertraline  Diarrhea, Other (See Comments)   Acute GI  distress with diarrhea and pain.         Medication List     TAKE these medications    amoxicillin -clavulanate 875-125 MG tablet Commonly known as: AUGMENTIN  Take 1 tablet by mouth 2 (two) times daily for 3 days.   azithromycin 500 MG tablet Commonly known as: Zithromax Take 1 tablet (500 mg total) by mouth daily for 3 days.   nicotine  polacrilex 2 MG gum Commonly known as:  NICORETTE  Take 1 each (2 mg total) by mouth as needed for smoking cessation.   ondansetron  4 MG tablet Commonly known as: ZOFRAN  Take 1 tablet (4 mg total) by mouth every 6 (six) hours as needed for nausea.         Discharge Exam: Filed Weights   02/15/24 2339  Weight: 63.5 kg    GENERAL:  Alert, pleasant, no acute distress  HEENT:  EOMI CARDIOVASCULAR:  RRR, no murmurs appreciated RESPIRATORY:  Clear to auscultation, no wheezing, rales, or rhonchi GASTROINTESTINAL:  Soft, nontender, nondistended EXTREMITIES:  No LE edema bilaterally NEURO:  No new focal deficits appreciated SKIN:  No rashes noted PSYCH:  Appropriate mood and affect     Condition at discharge: improving  The results of significant diagnostics from this hospitalization (including imaging, microbiology, ancillary and laboratory) are listed below for reference.   Imaging Studies: VAS US  LOWER EXTREMITY VENOUS (DVT) Result Date: 02/16/2024  Lower Venous DVT Study Patient Name:  GIOVANNI BIBY  Date of Exam:   02/16/2024 Medical Rec #: [id number]         Accession #:    [id number] Date of Birth: 03/08/1971         Patient Gender: M Patient Age:   53 years Exam Location:  [facility] Procedure:      VAS US  LOWER EXTREMITY VENOUS (DVT) Referring Phys: MAXIMINO SHARPS --------------------------------------------------------------------------------  Indications: SOB, and chest pain, leg pain.  Comparison Study: Previous study on 9.9.2010. Performing Technologist: Edilia Elden Appl  Examination Guidelines: A complete evaluation includes B-mode imaging, spectral Doppler, color Doppler, and power Doppler as needed of all accessible portions of each vessel. Bilateral testing is considered an integral part of a complete examination. Limited examinations for reoccurring indications may be performed as noted. The reflux portion of the exam is performed with the patient in reverse Trendelenburg.   +---------+---------------+---------+-----------+----------+--------------+ RIGHT    CompressibilityPhasicitySpontaneityPropertiesThrombus Aging +---------+---------------+---------+-----------+----------+--------------+ CFV      Full           Yes      Yes                                 +---------+---------------+---------+-----------+----------+--------------+ SFJ      Full           Yes      Yes                                 +---------+---------------+---------+-----------+----------+--------------+ FV Prox  Full                                                        +---------+---------------+---------+-----------+----------+--------------+ FV Mid   Full                                                        +---------+---------------+---------+-----------+----------+--------------+  FV DistalFull                                                        +---------+---------------+---------+-----------+----------+--------------+ PFV      Full                                                        +---------+---------------+---------+-----------+----------+--------------+ POP      Full           Yes      Yes                                 +---------+---------------+---------+-----------+----------+--------------+ PTV      Full                                                        +---------+---------------+---------+-----------+----------+--------------+ PERO     Full                                                        +---------+---------------+---------+-----------+----------+--------------+   +---------+---------------+---------+-----------+----------+--------------+ LEFT     CompressibilityPhasicitySpontaneityPropertiesThrombus Aging +---------+---------------+---------+-----------+----------+--------------+ CFV      Full           Yes      Yes                                  +---------+---------------+---------+-----------+----------+--------------+ SFJ      Full           Yes      Yes                                 +---------+---------------+---------+-----------+----------+--------------+ FV Prox  Full                                                        +---------+---------------+---------+-----------+----------+--------------+ FV Mid   Full                                                        +---------+---------------+---------+-----------+----------+--------------+ FV DistalFull                                                        +---------+---------------+---------+-----------+----------+--------------+  PFV      Full                                                        +---------+---------------+---------+-----------+----------+--------------+ POP      Full           Yes      Yes                                 +---------+---------------+---------+-----------+----------+--------------+ PTV      Full                                                        +---------+---------------+---------+-----------+----------+--------------+ PERO     Full                                                        +---------+---------------+---------+-----------+----------+--------------+     Summary: BILATERAL: - No evidence of deep vein thrombosis seen in the lower extremities, bilaterally. -No evidence of popliteal cyst, bilaterally.   *See table(s) above for measurements and observations. Electronically signed by Gaile New MD on 02/16/2024 at 4:24:58 PM.    Final    CT Angio Chest PE W and/or Wo Contrast Result Date: 02/16/2024 EXAM: CTA CHEST 02/16/2024 04:06:48 AM TECHNIQUE: CTA of the chest was performed without and with the administration of 65 mL of iohexol  (OMNIPAQUE ) 350 MG/ML injection. Multiplanar reformatted images are provided for review. MIP images are provided for review. Automated exposure control, iterative  reconstruction, and/or weight based adjustment of the mA/kV was utilized to reduce the radiation dose to as low as reasonably achievable. COMPARISON: AP and lateral chest series from today, AP and lateral chest 02/12/2024, PA chest 06/06/2021, and CTA chest 04/30/2017. CLINICAL HISTORY: Pulmonary embolism (PE) suspected, high prob. 65cc [id number]; Pt complains of cough- productive with yellow mucous, fevers, body aches. Onset 4 days ago. Daily smoker, no hx of COPD/asthma. FINDINGS: PULMONARY ARTERIES: Pulmonary arteries are adequately opacified for evaluation. No acute pulmonary embolus. Main pulmonary artery is normal in caliber. MEDIASTINUM: The heart and pericardium demonstrate no acute abnormality. There is no acute abnormality of the thoracic aorta. The trachea, both main bronchi, and thoracic esophagus are unremarkable. LYMPH NODES: Mildly prominent right hilar lymph nodes up to 1.2 cm in short axis. Subcarinal lymph nodes up to 1.4 cm. These lymph nodes are probably reactive. No other adenopathy is seen, including both axillae. LUNGS AND PLEURA: Respiratory motion limits fine detail in the lungs. There are minor changes of paraseptal emphysema in the lung apices. There is diffuse bronchial wall thickening. There is peribronchovascular hazy as well as nodular airspace disease in the right upper lobe, the left upper lobe lingula, in the infrahilar areas of the right middle lobe and in a patchy distribution in the basal segments of both of the lower lobes. The remaining lungs are clear. There is no pleural effusion, thickening, or pneumothorax. UPPER ABDOMEN: Limited images of  the upper abdomen are unremarkable. SOFT TISSUES AND BONES: No acute bone or soft tissue abnormality. IMPRESSION: 1. No pulmonary embolism or  arterial dilatation is seen . 2. Multifocal peribronchovascular hazy and nodular airspace disease involving the right upper lobe, lingula, right middle lobe infrahilar region, and basal segments of  both lower lobes, compatible with multifocal infectious/inflammatory process such as bronchopneumonia. A follow-up chest CT is recommended after treatment to ensure clearing . 3. Mildly enlarged right hilar and subcarinal lymph nodes, likely reactive. Electronically signed by: Francis Quam MD 02/16/2024 04:44 AM EDT RP Workstation: [id number]   DG Chest 2 View Result Date: 02/16/2024 CLINICAL DATA:  Cough EXAM: CHEST - 2 VIEW COMPARISON:  02/12/2024 FINDINGS: Heart mediastinal contours are within normal limits. Right lung clear. Left lower lobe airspace opacity concerning for pneumonia. No effusions. No acute bony abnormality. IMPRESSION: Left lower lobe opacity concerning for pneumonia. Electronically Signed   By: Franky Crease M.D.   On: 02/16/2024 00:29   DG Chest 2 View Result Date: 02/12/2024 CLINICAL DATA:  Cough, congestion EXAM: CHEST - 2 VIEW COMPARISON:  06/06/2021 FINDINGS: The heart size and mediastinal contours are within normal limits. Both lungs are clear. The visualized skeletal structures are unremarkable. IMPRESSION: No active cardiopulmonary disease. Electronically Signed   By: Franky Crease M.D.   On: 02/12/2024 21:49    Microbiology: Results for orders placed or performed during the hospital encounter of 02/15/24  Resp panel by RT-PCR (RSV, Flu A&B, Covid) Anterior Nasal Swab     Status: None   Collection Time: 02/15/24 11:40 PM   Specimen: Anterior Nasal Swab  Result Value Ref Range Status   SARS Coronavirus 2 by RT PCR NEGATIVE NEGATIVE Final   Influenza A by PCR NEGATIVE NEGATIVE Final   Influenza B by PCR NEGATIVE NEGATIVE Final    Comment: (NOTE) The Xpert Xpress SARS-CoV-2/FLU/RSV plus assay is intended as an aid in the diagnosis of influenza from Nasopharyngeal swab specimens and should not be used as a sole basis for treatment. Nasal washings and aspirates are unacceptable for Xpert Xpress SARS-CoV-2/FLU/RSV testing.  Fact Sheet for  Patients: BloggerCourse.com  Fact Sheet for Healthcare Providers: SeriousBroker.it  This test is not yet approved or cleared by the [place]  FDA and has been authorized for detection and/or diagnosis of SARS-CoV-2 by FDA under an Emergency Use Authorization (EUA). This EUA will remain in effect (meaning this test can be used) for the duration of the COVID-19 declaration under Section 564(b)(1) of the Act, 21 U.S.C. section 360bbb-3(b)(1), unless the authorization is terminated or revoked.     Resp Syncytial Virus by PCR NEGATIVE NEGATIVE Final    Comment: (NOTE) Fact Sheet for Patients: BloggerCourse.com  Fact Sheet for Healthcare Providers: SeriousBroker.it  This test is not yet approved or cleared by the [place]  FDA and has been authorized for detection and/or diagnosis of SARS-CoV-2 by FDA under an Emergency Use Authorization (EUA). This EUA will remain in effect (meaning this test can be used) for the duration of the COVID-19 declaration under Section 564(b)(1) of the Act, 21 U.S.C. section 360bbb-3(b)(1), unless the authorization is terminated or revoked.  Performed at [facility], [address], [place], [place] [address]   Culture, blood (routine x 2)     Status: None (Preliminary result)   Collection Time: 02/16/24  2:58 AM   Specimen: BLOOD  Result Value Ref Range Status   Specimen Description BLOOD SITE NOT SPECIFIED  Final   Special Requests  Final    BOTTLES DRAWN AEROBIC AND ANAEROBIC Blood Culture results may not be optimal due to an inadequate volume of blood received in culture bottles   Culture   Final    NO GROWTH 1 DAY Performed at [facility], [address] [place], [place] [address]    Report Status PENDING  Incomplete  Culture, blood (routine x 2)     Status: None (Preliminary result)   Collection Time: 02/16/24  3:34 AM    Specimen: BLOOD  Result Value Ref Range Status   Specimen Description BLOOD SITE NOT SPECIFIED  Final   Special Requests   Final    BOTTLES DRAWN AEROBIC AND ANAEROBIC Blood Culture results may not be optimal due to an inadequate volume of blood received in culture bottles   Culture   Final    NO GROWTH 1 DAY Performed at [facility], [address], [place], [place] [address]    Report Status PENDING  Incomplete    Labs: CBC: Recent Labs  Lab 02/12/24 2108 02/16/24 0258 02/17/24 0442  WBC 16.2* 18.0* 14.2*  NEUTROABS  --  14.9*  --   HGB 14.3 14.0 12.2*  HCT 43.7 43.8 37.4*  MCV 93.8 93.6 93.3  PLT 219 228 224   Basic Metabolic Panel: Recent Labs  Lab 02/12/24 2108 02/16/24 0258 02/17/24 0442  NA 135 137 140  K 4.0 3.9 3.6  CL 99 98 105  CO2 24 24 26   GLUCOSE 122* 132* 97  BUN 13 16 10   CREATININE 1.08 1.08 0.81  CALCIUM 9.5 9.7 9.0   Liver Function Tests: Recent Labs  Lab 02/16/24 0258  AST 23  ALT 15  ALKPHOS 55  BILITOT 1.0  PROT 8.0  ALBUMIN 4.2   CBG: No results for input(s): GLUCAP in the last 168 hours.  Discharge time spent: 25 minutes.  Length of inpatient stay: 1 days  Signed: Carliss LELON Canales, DO [facility] 02/17/2024

## 2024-02-21 LAB — CULTURE, BLOOD (ROUTINE X 2)
Culture: NO GROWTH
Culture: NO GROWTH

## 2024-02-22 ENCOUNTER — Other Ambulatory Visit: Payer: Self-pay

## 2024-02-22 ENCOUNTER — Encounter (HOSPITAL_COMMUNITY): Payer: Self-pay

## 2024-02-22 ENCOUNTER — Emergency Department (HOSPITAL_COMMUNITY): Payer: MEDICAID

## 2024-02-22 ENCOUNTER — Emergency Department (HOSPITAL_COMMUNITY)
Admission: EM | Admit: 2024-02-22 | Discharge: 2024-02-23 | Disposition: A | Discharge status: Other Acute Inpatient Hospital | Payer: MEDICAID

## 2024-02-22 DIAGNOSIS — Z79899 Other long term (current) drug therapy: Secondary | ICD-10-CM | POA: Diagnosis not present

## 2024-02-22 DIAGNOSIS — R45851 Suicidal ideations: Secondary | ICD-10-CM | POA: Insufficient documentation

## 2024-02-22 DIAGNOSIS — F1290 Cannabis use, unspecified, uncomplicated: Secondary | ICD-10-CM | POA: Diagnosis not present

## 2024-02-22 DIAGNOSIS — F32A Depression, unspecified: Secondary | ICD-10-CM | POA: Insufficient documentation

## 2024-02-22 DIAGNOSIS — F333 Major depressive disorder, recurrent, severe with psychotic symptoms: Secondary | ICD-10-CM | POA: Diagnosis not present

## 2024-02-22 DIAGNOSIS — F29 Unspecified psychosis not due to a substance or known physiological condition: Secondary | ICD-10-CM | POA: Diagnosis not present

## 2024-02-22 LAB — ETHANOL: Alcohol, Ethyl (B): <15 mg/dL (ref <15)

## 2024-02-22 LAB — CBC
HCT: 33.6 % — ABNORMAL LOW (ref 39.0–52.0)
Hemoglobin: 10.4 g/dL — ABNORMAL LOW (ref 13.0–17.0)
MCH: 30.1 pg (ref 26.0–34.0)
MCHC: 31 g/dL (ref 30.0–36.0)
MCV: 97.1 fL (ref 80.0–100.0)
Platelets: 365 K/uL (ref 150–400)
RBC: 3.46 MIL/uL — ABNORMAL LOW (ref 4.22–5.81)
RDW: 13.1 % (ref 11.5–15.5)
WBC: 14.7 K/uL — ABNORMAL HIGH (ref 4.0–10.5)
nRBC: 0 % (ref 0.0–0.2)

## 2024-02-22 LAB — COMPREHENSIVE METABOLIC PANEL WITH GFR
ALT: 9 U/L (ref 0–44)
AST: 19 U/L (ref 15–41)
Albumin: 4.1 g/dL (ref 3.5–5.0)
Alkaline Phosphatase: 55 U/L (ref 38–126)
Anion gap: 11 (ref 5–15)
BUN: 11 mg/dL (ref 6–20)
CO2: 28 mmol/L (ref 22–32)
Calcium: 9.5 mg/dL (ref 8.9–10.3)
Chloride: 104 mmol/L (ref 98–111)
Creatinine, Ser: 0.79 mg/dL (ref 0.61–1.24)
GFR, Estimated: >60 mL/min (ref >60)
Glucose, Bld: 119 mg/dL — ABNORMAL HIGH (ref 70–99)
Potassium: 3.6 mmol/L (ref 3.5–5.1)
Sodium: 143 mmol/L (ref 135–145)
Total Bilirubin: 0.3 mg/dL (ref 0.0–1.2)
Total Protein: 7.2 g/dL (ref 6.5–8.1)

## 2024-02-22 LAB — URINE DRUG SCREEN
Amphetamines: NEGATIVE
Barbiturates: NEGATIVE
Benzodiazepines: NEGATIVE
Cocaine: NEGATIVE
Fentanyl: NEGATIVE
Methadone Scn, Ur: NEGATIVE
Opiates: NEGATIVE
Tetrahydrocannabinol: POSITIVE — AB

## 2024-02-22 MED ORDER — ACETAMINOPHEN 325 MG PO TABS
650.0000 mg | ORAL_TABLET | Freq: Once | ORAL | Status: DC
  Filled 2024-02-22: qty 2

## 2024-02-22 MED ORDER — METHOCARBAMOL 500 MG PO TABS
1000.0000 mg | ORAL_TABLET | Freq: Once | ORAL | Status: AC
  Administered 2024-02-22: 1000 mg via ORAL
  Filled 2024-02-22: qty 2

## 2024-02-22 NOTE — ED Notes (Signed)
 Pt belongings in 9-25 pt belongings area. Pt has a suitcase and 2 bags.

## 2024-02-22 NOTE — ED Triage Notes (Addendum)
 Patient has been suicidal all day. No plan. Is scared to be alone. His family recommended him to come to ER. Feels depressed. Is not taking any medications for depression.

## 2024-02-22 NOTE — ED Provider Notes (Signed)
 [place] EMERGENCY DEPARTMENT AT [facility] Provider Note   CSN: [id number] Arrival date & time: 02/22/24  1820     Patient presents with: Suicidal   [name] is a 53 y.o. male.   53 year old male presents for evaluation of suicidal thoughts.  States that been going on for a while but getting worse today.  States he has no active plan but he has attempted in the past to kill himself.  He states he has been feeling very depressed lately as he has had recent bouts of grief and losses in his family.  Denies any homicidal ideation.  States he is not hearing voices currently but has in the past.  He is not currently on any medication.        Prior to Admission medications   Medication Sig Start Date End Date Taking? Authorizing Provider  nicotine  polacrilex (NICORETTE ) 2 MG gum Take 1 each (2 mg total) by mouth as needed for smoking cessation. 02/17/24   Arlon Carliss ORN, DO  ondansetron  (ZOFRAN ) 4 MG tablet Take 1 tablet (4 mg total) by mouth every 6 (six) hours as needed for nausea. 02/17/24   Arlon Carliss ORN, DO    Allergies: Aspirin, Ibuprofen , Porcine (pork) protein-containing drug products, Acetaminophen , and Sertraline     Review of Systems  Constitutional:  Negative for chills and fever.  HENT:  Negative for ear pain and sore throat.   Eyes:  Negative for pain and visual disturbance.  Respiratory:  Negative for cough and shortness of breath.   Cardiovascular:  Negative for chest pain and palpitations.  Gastrointestinal:  Negative for abdominal pain and vomiting.  Genitourinary:  Negative for dysuria and hematuria.  Musculoskeletal:  Negative for arthralgias and back pain.  Skin:  Negative for color change and rash.  Neurological:  Negative for seizures and syncope.  Psychiatric/Behavioral:  Positive for dysphoric mood and suicidal ideas. Negative for self-injury.   All other systems reviewed and are negative.   Updated Vital Signs BP (!) 114/94    Pulse 91   Temp 99.1 F (37.3 C) (Oral)   Resp 17   Ht 5' 7 (1.702 m)   Wt 64 kg   SpO2 97%   BMI 22.10 kg/m   Physical Exam Vitals and nursing note reviewed.  Constitutional:      General: He is not in acute distress.    Appearance: He is well-developed.  HENT:     Head: Normocephalic and atraumatic.  Eyes:     Conjunctiva/sclera: Conjunctivae normal.  Cardiovascular:     Rate and Rhythm: Normal rate and regular rhythm.     Heart sounds: No murmur heard. Pulmonary:     Effort: Pulmonary effort is normal. No respiratory distress.     Breath sounds: Normal breath sounds.  Abdominal:     Palpations: Abdomen is soft.     Tenderness: There is no abdominal tenderness.  Musculoskeletal:        General: No swelling.     Cervical back: Neck supple.  Skin:    General: Skin is warm and dry.     Capillary Refill: Capillary refill takes less than 2 seconds.  Neurological:     Mental Status: He is alert.  Psychiatric:     Comments: Admits suicidal thoughts      (all labs ordered are listed, but only abnormal results are displayed) Labs Reviewed  COMPREHENSIVE METABOLIC PANEL WITH GFR  ETHANOL  URINE DRUG SCREEN  CBC    EKG: None  Radiology: No results found.   Procedures   Medications Ordered in the ED - No data to display  Clinical Course as of 02/22/24 2006  Tue Feb 22, 2024  2005 EKG not crossing over into MUSE.  Inter by me in the absence of cardiology and shows sinus rhythm with PACs, rate 87, LVH, no STEMI or acute change when compared to prior [MK]    Clinical Course User Index [MK] Gennaro Duwaine CROME, DO                                 Medical Decision Making Social determinants of health: History of mental health disorders, not currently taking any medications  Patient here for suicidal thoughts.  Has no active plan.  Labs pending and TDS/psych consult placed.  He is not currently on any medications for depression and was feeling very unsafe at  home.  He thinks he may be admitted to himself.  I think he may benefit from inpatient psychiatric care.  Problems Addressed: Suicidal thoughts: acute illness or injury  Amount and/or Complexity of Data Reviewed External Data Reviewed: notes.    Details: Prior hospital records due to patient recently here for pneumonia Labs: ordered.    Details: Ordered and pending  Risk OTC drugs. Prescription drug management. Decision regarding hospitalization. Diagnosis or treatment significantly limited by social determinants of health.     Final diagnoses:  Suicidal thoughts    ED Discharge Orders     None          Gennaro Duwaine CROME, DO 02/22/24 1903

## 2024-02-22 NOTE — BH Assessment (Signed)
 TTS Consult will be completed by IRIS. IRIS Coordinator will communicate in this chat assessment time and provider name. Thanks

## 2024-02-23 ENCOUNTER — Encounter: Payer: Self-pay | Admitting: Psychiatry

## 2024-02-23 ENCOUNTER — Inpatient Hospital Stay
Admission: AD | Admit: 2024-02-23 | Discharge: 2024-02-28 | DRG: 885 | Disposition: A | Discharge status: Home / Self Care | Payer: MEDICAID | Source: Intra-hospital | Attending: Psychiatry | Admitting: Psychiatry

## 2024-02-23 DIAGNOSIS — F333 Major depressive disorder, recurrent, severe with psychotic symptoms: Secondary | ICD-10-CM

## 2024-02-23 DIAGNOSIS — Z681 Body mass index (BMI) 19 or less, adult: Secondary | ICD-10-CM

## 2024-02-23 DIAGNOSIS — Z886 Allergy status to analgesic agent status: Secondary | ICD-10-CM

## 2024-02-23 DIAGNOSIS — R079 Chest pain, unspecified: Secondary | ICD-10-CM | POA: Diagnosis not present

## 2024-02-23 DIAGNOSIS — I491 Atrial premature depolarization: Secondary | ICD-10-CM | POA: Diagnosis present

## 2024-02-23 DIAGNOSIS — Z59868 Other specified financial insecurity: Secondary | ICD-10-CM

## 2024-02-23 DIAGNOSIS — M79604 Pain in right leg: Secondary | ICD-10-CM | POA: Diagnosis present

## 2024-02-23 DIAGNOSIS — Z5948 Other specified lack of adequate food: Secondary | ICD-10-CM

## 2024-02-23 DIAGNOSIS — R0789 Other chest pain: Secondary | ICD-10-CM | POA: Diagnosis not present

## 2024-02-23 DIAGNOSIS — Z560 Unemployment, unspecified: Secondary | ICD-10-CM | POA: Diagnosis not present

## 2024-02-23 DIAGNOSIS — F1290 Cannabis use, unspecified, uncomplicated: Secondary | ICD-10-CM | POA: Diagnosis not present

## 2024-02-23 DIAGNOSIS — M7918 Myalgia, other site: Secondary | ICD-10-CM | POA: Diagnosis present

## 2024-02-23 DIAGNOSIS — Z8659 Personal history of other mental and behavioral disorders: Secondary | ICD-10-CM | POA: Diagnosis not present

## 2024-02-23 DIAGNOSIS — Z888 Allergy status to other drugs, medicaments and biological substances status: Secondary | ICD-10-CM

## 2024-02-23 DIAGNOSIS — Z8249 Family history of ischemic heart disease and other diseases of the circulatory system: Secondary | ICD-10-CM

## 2024-02-23 DIAGNOSIS — F329 Major depressive disorder, single episode, unspecified: Secondary | ICD-10-CM | POA: Diagnosis not present

## 2024-02-23 DIAGNOSIS — Z8619 Personal history of other infectious and parasitic diseases: Secondary | ICD-10-CM | POA: Diagnosis not present

## 2024-02-23 DIAGNOSIS — R636 Underweight: Secondary | ICD-10-CM | POA: Diagnosis present

## 2024-02-23 DIAGNOSIS — F331 Major depressive disorder, recurrent, moderate: Principal | ICD-10-CM | POA: Diagnosis present

## 2024-02-23 DIAGNOSIS — R001 Bradycardia, unspecified: Secondary | ICD-10-CM | POA: Diagnosis present

## 2024-02-23 DIAGNOSIS — Z79899 Other long term (current) drug therapy: Secondary | ICD-10-CM

## 2024-02-23 DIAGNOSIS — Z91014 Allergy to mammalian meats: Secondary | ICD-10-CM

## 2024-02-23 DIAGNOSIS — Z5941 Food insecurity: Secondary | ICD-10-CM | POA: Diagnosis not present

## 2024-02-23 DIAGNOSIS — R45851 Suicidal ideations: Secondary | ICD-10-CM | POA: Diagnosis present

## 2024-02-23 DIAGNOSIS — F17210 Nicotine dependence, cigarettes, uncomplicated: Secondary | ICD-10-CM | POA: Diagnosis present

## 2024-02-23 DIAGNOSIS — D649 Anemia, unspecified: Secondary | ICD-10-CM | POA: Diagnosis present

## 2024-02-23 DIAGNOSIS — I38 Endocarditis, valve unspecified: Secondary | ICD-10-CM | POA: Diagnosis not present

## 2024-02-23 DIAGNOSIS — Z8701 Personal history of pneumonia (recurrent): Secondary | ICD-10-CM | POA: Diagnosis not present

## 2024-02-23 DIAGNOSIS — G8929 Other chronic pain: Secondary | ICD-10-CM | POA: Diagnosis present

## 2024-02-23 DIAGNOSIS — Z5982 Transportation insecurity: Secondary | ICD-10-CM | POA: Diagnosis not present

## 2024-02-23 DIAGNOSIS — J189 Pneumonia, unspecified organism: Secondary | ICD-10-CM | POA: Diagnosis not present

## 2024-02-23 DIAGNOSIS — R030 Elevated blood-pressure reading, without diagnosis of hypertension: Secondary | ICD-10-CM | POA: Diagnosis present

## 2024-02-23 DIAGNOSIS — M940 Chondrocostal junction syndrome [Tietze]: Secondary | ICD-10-CM | POA: Diagnosis present

## 2024-02-23 DIAGNOSIS — F419 Anxiety disorder, unspecified: Secondary | ICD-10-CM | POA: Diagnosis present

## 2024-02-23 DIAGNOSIS — F1911 Other psychoactive substance abuse, in remission: Secondary | ICD-10-CM | POA: Insufficient documentation

## 2024-02-23 LAB — TSH: TSH: 1.477 u[IU]/mL (ref 0.350–4.500)

## 2024-02-23 LAB — CBC WITH DIFFERENTIAL/PLATELET
Abs Immature Granulocytes: 0.08 K/uL — ABNORMAL HIGH (ref 0.00–0.07)
Basophils Absolute: 0.1 K/uL (ref 0.0–0.1)
Basophils Relative: 1 %
Eosinophils Absolute: 0.5 K/uL (ref 0.0–0.5)
Eosinophils Relative: 4 %
HCT: 34.2 % — ABNORMAL LOW (ref 39.0–52.0)
Hemoglobin: 11 g/dL — ABNORMAL LOW (ref 13.0–17.0)
Immature Granulocytes: 1 %
Lymphocytes Relative: 29 %
Lymphs Abs: 3.3 K/uL (ref 0.7–4.0)
MCH: 30.6 pg (ref 26.0–34.0)
MCHC: 32.2 g/dL (ref 30.0–36.0)
MCV: 95.3 fL (ref 80.0–100.0)
Monocytes Absolute: 0.9 K/uL (ref 0.1–1.0)
Monocytes Relative: 8 %
Neutro Abs: 6.7 K/uL (ref 1.7–7.7)
Neutrophils Relative %: 57 %
Platelets: 393 K/uL (ref 150–400)
RBC: 3.59 MIL/uL — ABNORMAL LOW (ref 4.22–5.81)
RDW: 13 % (ref 11.5–15.5)
WBC: 11.5 K/uL — ABNORMAL HIGH (ref 4.0–10.5)
nRBC: 0 % (ref 0.0–0.2)

## 2024-02-23 LAB — D-DIMER, QUANTITATIVE: D-Dimer, Quant: 0.43 ug{FEU}/mL (ref 0.00–0.50)

## 2024-02-23 LAB — PROCALCITONIN: Procalcitonin: <0.1 ng/mL

## 2024-02-23 MED ORDER — DIPHENHYDRAMINE HCL 25 MG PO CAPS: 50.0000 mg | ORAL_CAPSULE | Freq: Three times a day (TID) | ORAL | Status: DC | PRN

## 2024-02-23 MED ORDER — MAGNESIUM HYDROXIDE 400 MG/5ML PO SUSP: 30.0000 mL | Freq: Every day | ORAL | Status: DC | PRN

## 2024-02-23 MED ORDER — ALUM & MAG HYDROXIDE-SIMETH 200-200-20 MG/5ML PO SUSP: 30.0000 mL | ORAL | Status: DC | PRN

## 2024-02-23 MED ORDER — HALOPERIDOL LACTATE 5 MG/ML IJ SOLN: 5.0000 mg | Freq: Three times a day (TID) | INTRAMUSCULAR | Status: DC | PRN

## 2024-02-23 MED ORDER — OXYCODONE HCL 5 MG PO TABS: 5.0000 mg | ORAL_TABLET | ORAL | Status: DC | PRN

## 2024-02-23 MED ORDER — HYDROXYZINE HCL 25 MG PO TABS: 25.0000 mg | ORAL_TABLET | Freq: Three times a day (TID) | ORAL | Status: DC | PRN

## 2024-02-23 MED ORDER — LORAZEPAM 2 MG/ML IJ SOLN: 2.0000 mg | Freq: Three times a day (TID) | INTRAMUSCULAR | Status: DC | PRN

## 2024-02-23 MED ORDER — DIPHENHYDRAMINE HCL 50 MG/ML IJ SOLN: 50.0000 mg | Freq: Three times a day (TID) | INTRAMUSCULAR | Status: DC | PRN

## 2024-02-23 MED ORDER — HYDROXYZINE HCL 25 MG PO TABS: 50.0000 mg | ORAL_TABLET | Freq: Four times a day (QID) | ORAL | Status: DC | PRN

## 2024-02-23 MED ORDER — TRAZODONE HCL 50 MG PO TABS
50.0000 mg | ORAL_TABLET | Freq: Every evening | ORAL | Status: DC | PRN
  Administered 2024-02-24 – 2024-02-27 (×4): 50 mg via ORAL
  Filled 2024-02-23 (×4): qty 1

## 2024-02-23 MED ORDER — ZIPRASIDONE MESYLATE 20 MG IM SOLR: 10.0000 mg | Freq: Four times a day (QID) | INTRAMUSCULAR | Status: DC | PRN

## 2024-02-23 MED ORDER — HALOPERIDOL 5 MG PO TABS: 5.0000 mg | ORAL_TABLET | Freq: Three times a day (TID) | ORAL | Status: DC | PRN

## 2024-02-23 MED ORDER — HALOPERIDOL LACTATE 5 MG/ML IJ SOLN: 10.0000 mg | Freq: Three times a day (TID) | INTRAMUSCULAR | Status: DC | PRN

## 2024-02-23 MED ORDER — DIAZEPAM 5 MG/ML IJ SOLN: 10.0000 mg | Freq: Four times a day (QID) | INTRAMUSCULAR | Status: DC | PRN

## 2024-02-23 MED ORDER — DM-GUAIFENESIN ER 30-600 MG PO TB12
1.0000 | ORAL_TABLET | Freq: Two times a day (BID) | ORAL | Status: DC
  Administered 2024-02-23 – 2024-02-28 (×9): 1 via ORAL
  Filled 2024-02-23 (×11): qty 1

## 2024-02-23 NOTE — Group Note (Signed)
 [facility] LCSW Group Therapy Note   Group Date: 02/23/2024 Start Time: 1300 End Time: 1400   Type of Therapy/Topic:  Group Therapy:  Emotion Regulation  Participation Level:  Did Not Attend   Mood:  Description of Group:    The purpose of this group is to assist patients in learning to regulate negative emotions and experience positive emotions. Patients will be guided to discuss ways in which they have been vulnerable to their negative emotions. These vulnerabilities will be juxtaposed with experiences of positive emotions or situations, and patients challenged to use positive emotions to combat negative ones. Special emphasis will be placed on coping with negative emotions in conflict situations, and patients will process healthy conflict resolution skills.  Therapeutic Goals: Patient will identify two positive emotions or experiences to reflect on in order to balance out negative emotions:  Patient will label two or more emotions that they find the most difficult to experience:  Patient will be able to demonstrate positive conflict resolution skills through discussion or role plays:   Summary of Patient Progress:   Patient declined to attend group.     Therapeutic Modalities:   Cognitive Behavioral Therapy Feelings Identification Dialectical Behavioral Therapy   Sherryle JINNY Margo, LCSW

## 2024-02-23 NOTE — Assessment & Plan Note (Signed)
 Continued management per psychiatry

## 2024-02-23 NOTE — Group Note (Signed)
 Date:  02/23/2024 Time:  9:00 PM  Group Topic/Focus:  Wrap-Up Group:   The focus of this group is to help patients review their daily goal of treatment and discuss progress on daily workbooks.    Participation Level:  Active  Participation Quality:  Appropriate and Attentive  Affect:  Appropriate  Cognitive:  Alert and Appropriate  Insight: Appropriate and Good  Engagement in Group:  Engaged  Modes of Intervention:  Orientation  Additional Comments:     [name] 02/23/2024, 9:00 PM

## 2024-02-23 NOTE — Progress Notes (Signed)
 Pt was accepted to [facility] TODAY 02/23/2024 Bed assignment: 310  Pt meets inpatient criteria per: Drury Doughty MD  Attending Physician will be Donnelly MD  Report can be called to: [phone]  Pt can arrive after [facility] WILL UPDATE   Care Team Notified: Cherylynn Ernst RN, Chesley Holt [facility]   [place] Haziel Molner LCSW-A   02/23/2024 9:29 AM

## 2024-02-23 NOTE — Assessment & Plan Note (Signed)
 Suspecting nonacute and related to persistent coughing from recent pneumonia We will treat with dextromethorphan guaifenesin Pain control, multimodal-patient states he is intolerant to ibuprofen  and NSAIDs and does not think Tylenol  will help We will continue to monitor

## 2024-02-23 NOTE — Consult Note (Incomplete)
 Initial Consultation Note   Patient: [name] FMW:[id number] DOB: 1970/07/22 PCP: Patient, No Pcp Per DOA: 02/23/2024 DOS: the patient was seen and examined on 02/23/2024 Primary service: Donnelly Mellow, MD  Referring physician: Gaither Pouch, NP Reason for consult: chest pain  Assessment/Plan: Assessment and Plan: * MDD (major depressive disorder) Continued management per psychiatry  Chest pain Suspecting nonacute and related to persistent coughing from recent pneumonia We will treat with dextromethorphan guaifenesin Pain control, multimodal-patient states he is intolerant to ibuprofen  and NSAIDs and does not think Tylenol  will help We will continue to monitor  Sinus bradycardia Multiple PACs on EKG Stable finding, when compared to EKG done on 02/15/2024 Heart rate in the high 30s to 60s without lightheadedness Will benefit from an echocardiogram and outpatient follow-up with cardiology for Holter monitoring at discharge Will place a cardiology consult for any additional recommendations   Right leg pain Pain appears chronic Right lower extremity venous ultrasound from 02/16/2024 was negative for DVT or popliteal cyst Suspect other musculoskeletal pain No evidence of cellulitis Pain control-patient states he cannot take ibuprofen  and does not think Tylenol  will help Multimodal pain control with as needed oxycodone , gabapentin  if needed and lidocaine  patch  History of sepsis secondary pneumonia (02/17/2024) Treated for pneumonia 10/14 to 02/17/2024 Has persistent coughing with phlegm production Mucolytic and antiexpectorant Follow-up CT chest  Underweight (BMI < 18.5) Baseline unknown  Chronic anemia Stable    Latest Ref Rng & Units 02/23/2024    9:09 PM 02/22/2024    9:20 PM 02/17/2024    4:42 AM  CBC  WBC 4.0 - 10.5 K/uL 11.5  14.7  14.2   Hemoglobin 13.0 - 17.0 g/dL 88.9  89.5  87.7   Hematocrit 39.0 - 52.0 % 34.2  33.6  37.4   Platelets 150 - 400  K/uL 393  365  224      History of substance abuse (HCC) UDS on 10/21 positive for cannabinoids       TRH will continue to follow the patient.  HPI: [name] is a 53 y.o. male, admitted to the behavioral health unit with suicidal ideation being seen in urgent consultation for chest pain and narcs as an overhead rapid response.  Patient, has a past medical history is significant for depression, anemia, polysubstance abuse, recently hospitalized from 10/14 to 02/17/2024 with sepsis secondary to pneumonia after presenting with cough, chest pain, cold sweats and right leg pain,  and with chest CTA showing multifocal pneumonia, without evidence of PE, subsequently rehospitalized to [facility] on 02/22/2024 with suicidal ideation. Upon my arrival, rapid response team at bedside and patient appears comfortable.  Vitals were notable for initial pulse in the 30s which improved to the 60s with further monitoring.  BP, respiratory rate, O2 sat within normal limits.  Afebrile EKG already done, per my interpretation, showed sinus rhythm in the 60s with evidence of LVH, PACs, similar to EKG done a week prior. Patient is awake and alert.  He describes pain in the upper anterior chest, worse with movement.  He describes it as episodic.  It is similar to the chest pain he had when he was hospitalized on 02/17/2024.  The episode tonight was associated with mild shortness of breath and he states he woke up in a sweat.  He denies nausea or vomiting.  Denies lightheadedness.  Continues to have pain in his right leg.  He does endorse persistent coughing and sputum production, mostly unchanged from his recent hospitalization.  He denies fever or  chills.. Of note, patient also had a right lower extremity venous ultrasound on 02/16/2024 that was negative for DVT.  CTA chest from 02/16/2024 which showed pneumonia had a recommendation to repeat CT chest following resolution.  Review of Systems: As mentioned in the history  of present illness. All other systems reviewed and are negative. Past Medical History:  Diagnosis Date   Alcohol  abuse    Anemia    Colitis    Depression    Severe major depression with psychotic features (HCC)    History reviewed. No pertinent surgical history. Social History:  reports that he has been smoking cigarettes. He has never used smokeless tobacco. He reports current drug use. Drug: Marijuana. He reports that he does not drink alcohol .  Allergies  Allergen Reactions   Aspirin Hives and Nausea And Vomiting   Ibuprofen  Hives and Nausea And Vomiting   Porcine (Pork) Protein-Containing Drug Products Other (See Comments)    Stomach pain. Pt does not eat pork   Acetaminophen  Nausea Only   Sertraline  Diarrhea and Other (See Comments)    Acute GI distress with diarrhea and pain.     Family History  Problem Relation Age of Onset   Hypertension Mother    Hypertension Father    Hypertension Other     Prior to Admission medications   Medication Sig Start Date End Date Taking? Authorizing Provider  nicotine  polacrilex (NICORETTE ) 2 MG gum Take 1 each (2 mg total) by mouth as needed for smoking cessation. 02/17/24   Arlon Carliss ORN, DO  ondansetron  (ZOFRAN ) 4 MG tablet Take 1 tablet (4 mg total) by mouth every 6 (six) hours as needed for nausea. 02/17/24   Arlon Carliss ORN, DO    Physical Exam: Vitals:   02/23/24 1300 02/23/24 2034  BP: 134/83 134/72  Pulse: (!) 57 (!) 37  Resp: 18 16  Temp: 98.6 F (37 C) 98.9 F (37.2 C)  TempSrc: Oral Oral  SpO2: 100% 100%  Weight: 51.7 kg   Height: 5' 7 (1.702 m)    Physical Exam Vitals and nursing note reviewed.  Constitutional:      General: He is not in acute distress.    Appearance: He is underweight.  HENT:     Head: Normocephalic and atraumatic.  Cardiovascular:     Rate and Rhythm: Regular rhythm. Bradycardia present.     Heart sounds: Normal heart sounds.  Pulmonary:     Effort: Pulmonary effort is normal.      Breath sounds: Normal breath sounds.  Abdominal:     Palpations: Abdomen is soft.     Tenderness: There is no abdominal tenderness.  Neurological:     Mental Status: Mental status is at baseline.       Latest Ref Rng & Units 02/23/2024    9:09 PM 02/22/2024    9:20 PM 02/17/2024    4:42 AM  CBC  WBC 4.0 - 10.5 K/uL 11.5  14.7  14.2   Hemoglobin 13.0 - 17.0 g/dL 88.9  89.5  87.7   Hematocrit 39.0 - 52.0 % 34.2  33.6  37.4   Platelets 150 - 400 K/uL 393  365  224     Procalcitonin <0.10 D-dimer 0.43 TSH 1.477        Data Reviewed: Relevant notes from primary care and specialist visits, past discharge summaries as available in EHR, including Care Everywhere. Prior diagnostic testing as pertinent to current admission diagnoses Updated medications and problem lists for reconciliation ED course, including vitals,  labs, imaging, treatment and response to treatment Triage notes, nursing and pharmacy notes and ED provider's notes Notable results as noted in HPI    Family Communication: none Primary team communication: Gaither Pouch, NP Thank you very much for involving us  in the care of your patient.  Author: Delayne LULLA Solian, MD 02/23/2024 9:27 PM  For on call review [email or web address].

## 2024-02-23 NOTE — Assessment & Plan Note (Signed)
-   Baseline unknown

## 2024-02-23 NOTE — Progress Notes (Signed)
   02/23/24 1930  Psych Admission Type (Psych Patients Only)  Admission Status Voluntary  Psychosocial Assessment  Patient Complaints Depression  Eye Contact Fair  Facial Expression Flat  Affect Flat  Speech Logical/coherent  Interaction Assertive  Motor Activity Slow  Appearance/Hygiene Improved  Behavior Characteristics Cooperative  Mood Depressed  Aggressive Behavior  Effect No apparent injury  Thought Process  Coherency WDL  Content WDL  Delusions WDL;None reported or observed  Perception WDL  Hallucination None reported or observed  Judgment Poor  Confusion None  Danger to Self  Current suicidal ideation? Passive  Self-Injurious Behavior No self-injurious ideation or behavior indicators observed or expressed   Agreement Not to Harm Self Yes  Description of Agreement va  Danger to Others  Danger to Others None reported or observed

## 2024-02-23 NOTE — Hospital Course (Signed)
 SABRA

## 2024-02-23 NOTE — ED Notes (Signed)
 Gone into pt room 4 times to ask him to sign consent. Pt states that he will then goes back to sleep. CSW aware

## 2024-02-23 NOTE — Tx Team (Signed)
 Initial Treatment Plan 02/23/2024 2:59 PM Kiwan Gadsden FMW:[id number]    PATIENT STRESSORS: Financial difficulties   Health problems   Medication change or noncompliance   Occupational concerns     PATIENT STRENGTHS: Active sense of humor  Average or above average intelligence  Capable of independent living  Motivation for treatment/growth  Religious Affiliation    PATIENT IDENTIFIED PROBLEMS: Low socioeconomic status  Loss of vocational status  Recent decline in health  Lack of social support               DISCHARGE CRITERIA:  Improved stabilization in mood, thinking, and/or behavior Reduction of life-threatening or endangering symptoms to within safe limits Safe-care adequate arrangements made  PRELIMINARY DISCHARGE PLAN: Attend aftercare/continuing care group Return to previous living arrangement  PATIENT/FAMILY INVOLVEMENT: This treatment plan has been presented to and reviewed with the patient, [name].  The patient and family have been given the opportunity to ask questions and make suggestions.  Lang Benne, RN 02/23/2024, 2:59 PM

## 2024-02-23 NOTE — Assessment & Plan Note (Signed)
 Stable

## 2024-02-23 NOTE — Assessment & Plan Note (Addendum)
 Pain appears chronic Right lower extremity venous ultrasound from 02/16/2024 was negative for DVT or popliteal cyst Suspect other musculoskeletal pain No evidence of cellulitis Pain control-patient states he cannot take ibuprofen  and does not think Tylenol  will help Multimodal pain control with as needed oxycodone , gabapentin  if needed and lidocaine  patch

## 2024-02-23 NOTE — Significant Event (Signed)
 Rapid Response Event Note   Reason for Call :  Chest pain, bradycardia  Initial Focused Assessment:  Rapid response RN arrived in patient's room with patient in bed surrounded by [facility] nursing staff. Patient alert and oriented. Patient reporting 8/10 chest pain, sharp, in upper chest, and worse with movement. He reports have a recent pneumonia diagnosis and still having issues with coughing up thick mucus. Nursing staff took patient's vital signs and noted an abnormal heart rate in the 30s. Nursing staff confirmed an irregular heart rate in 50s manually prior to calling rapid response. Just before connected patient to rapid response monitor, patient's heart rate jumped up to 60s on the vital signs machine and HR was 60s-70s in an irregular sinus rhythm on rapid response monitor. BP 143/77 MAP 65, oxygen saturations 100% on room air, respirations 20 and unlabored. Lungs clear bilaterally to auscultation.  Interventions:  12 lead EKG. Dr. Cleatus at bedside and ordered labs, pain medication, and a CT for the patient.  Plan of Care:  Patient to remain in Behavioral Health for now, but MD awaiting results to come back from patient's lab work and scan. Based on those results, and as patient's condition progresses, MD will make updates to patient's orders as needed. Behavioral Health staff to reach back out to rapid response team if needed.  Event Summary:   MD Notified: Dr. Cleatus Call Time: 20:51 Arrival Time: 20:54 End Time:21:20  GEORGINA LAURAINE NORRIS, RN

## 2024-02-23 NOTE — Progress Notes (Signed)
   02/23/24 1400  Psych Admission Type (Psych Patients Only)  Admission Status Voluntary  Psychosocial Assessment  Patient Complaints Depression;Hopelessness  Eye Contact Fair  Facial Expression Flat  Affect Flat  Speech Logical/coherent  Interaction Assertive  Motor Activity Slow  Appearance/Hygiene Improved (Showered)  Behavior Characteristics Cooperative  Mood Depressed  Aggressive Behavior  Effect No apparent injury  Thought Process  Coherency WDL  Content WDL  Delusions WDL  Perception WDL  Hallucination None reported or observed  Judgment Poor  Confusion None  Danger to Self  Current suicidal ideation? Denies  Self-Injurious Behavior No self-injurious ideation or behavior indicators observed or expressed   Agreement Not to Harm Self Yes  Description of Agreement Verbal  Danger to Others  Danger to Others None reported or observed   [name] presents with SI r/t various life stressors, including loss of vocation, loss of significant social support, lack of stable income, and a recent sharp decline of his health. Was recently hospitalized with pneumonia and states that this shook him. When questioned, states that he does not have any specific suicide plan, and states, I guess I don't actually want to kill myself because I am here and I want help. States that he lives alone and has had difficulty finding a job. Reports 12+ years of sobriety from all substances except for [facility]; is trying to quit THC use.

## 2024-02-23 NOTE — Assessment & Plan Note (Addendum)
 UDS on 10/21 positive for cannabinoids

## 2024-02-23 NOTE — Plan of Care (Signed)
  Problem: Activity: Goal: Risk for activity intolerance will decrease Outcome: Progressing   Problem: Safety: Goal: Ability to remain free from injury will improve Outcome: Progressing   

## 2024-02-23 NOTE — Progress Notes (Signed)
 Patient complained of 10/10 chest pain and diplopia. Upon attempting to ambulate , he reported feeling very dizzy as is he was going to faint. Patient was assisted to his bed by charge nurse and MHT.  His blood pressure was 141/100 and heart rate was 38. He states that he doesn't know why he feels like this and that he thinks he is going to faint. Rapid response team was called to assess this patient. Chest CT and labs were drawn per MD order. MD states she will evaluate his results and determine a treatment plan moving forward. Patient still reports chest discomfort and feeling generally unwell

## 2024-02-23 NOTE — Plan of Care (Signed)
  Problem: Education: Goal: Knowledge of [facility] Education information/materials will improve Outcome: Not Applicable Goal: Emotional status will improve Outcome: Not Applicable Goal: Mental status will improve Outcome: Not Applicable Goal: Verbalization of understanding the information provided will improve Outcome: Not Applicable   Problem: Activity: Goal: Interest or engagement in activities will improve Outcome: Not Applicable Goal: Sleeping patterns will improve Outcome: Not Applicable   Problem: Coping: Goal: Ability to verbalize frustrations and anger appropriately will improve Outcome: Not Applicable Goal: Ability to demonstrate self-control will improve Outcome: Not Applicable   Problem: Health Behavior/Discharge Planning: Goal: Identification of resources available to assist in meeting health care needs will improve Outcome: Not Applicable Goal: Compliance with treatment plan for underlying cause of condition will improve Outcome: Not Applicable   Problem: Physical Regulation: Goal: Ability to maintain clinical measurements within normal limits will improve Outcome: Not Applicable   Problem: Safety: Goal: Periods of time without injury will increase Outcome: Not Applicable   [name] only admitted today; progress not yet able to be measured.

## 2024-02-23 NOTE — ED Notes (Signed)
 Pt sleeping at present time. Sitter at door.

## 2024-02-23 NOTE — Assessment & Plan Note (Signed)
 Treated for pneumonia 10/14 to 02/17/2024 Has persistent coughing with phlegm production Mucolytic and antiexpectorant Last CTA chest on 02/15/24 with recommendation for follow up CT so can consider

## 2024-02-23 NOTE — Consult Note (Signed)
 [facility] Consult Note  Patient Name: [name] MRN: [id number] DOB: 02-07-1971 DATE OF Consult: 02/23/2024  PRIMARY PSYCHIATRIC DIAGNOSES   1.  Major Depression, Recurrent, Severe with Psychotic Features 2.  Cannabis Use Disorder   RECOMMENDATIONS  Recommendations: Medication recommendations: At this point, do not recommend starting any standing orders of psychotropic medication while in the ED.  PRN's:  for anxiety, hydroxyzine , 50 mg q6h PRN; for agitation/aggression, Geodon , 10 mg IM q6h PRN and Valium 10 mg IM q6h PRN. Non-Medication/therapeutic recommendations: Patient still with suicidal ideation, so recommend continued close observation of patient, as per ED protocol, until he can be safely admitted to psychiatry Is inpatient psychiatric hospitalization recommended for this patient? Yes (Explain why): Patient is complaining of active suicidal ideation in the context of severe depression, and even with some command hallucinations to harm himself earlier in the day.  He meets inpatient treatment criteria. Is another care setting recommended for this patient? (examples may include Crisis Stabilization Unit, Residential/Recovery Treatment, ALF/SNF, Memory Care Unit)  No (Explain why): As above From a psychiatric perspective, is this patient appropriate for discharge to an outpatient setting/resource or other less restrictive environment for continued care?  No (Explain why): As above Follow-Up Telepsychiatry C/L services: We will sign off for now. Please re-consult our service if needed for any concerning changes in the patient's condition, discharge planning, or questions. Communication: Treatment team members (and family members if applicable) who were involved in treatment/care discussions and planning, and with whom we spoke or engaged with via secure text/chat, include the following: Secure message sent to Dr. Neomi, ED attending, and staff, outlining above  recommendations.  Thank you for involving us  in the care of this patient. If you have any additional questions or concerns, please call [phone] and ask for the provider on-call.   TELEPSYCHIATRY ATTESTATION & CONSENT   As the provider for this telehealth consult, I attest that I verified the patient's identity using two separate identifiers, introduced myself to the patient, provided my credentials, disclosed my location, and performed this encounter via a HIPAA-compliant, real-time, face-to-face, two-way, interactive audio and video platform and with the full consent and agreement of the patient (or guardian as applicable.)   Patient physical location: ED, [facility]. Telehealth provider physical location: home office in state of [place] .  Video start time: 0250h EDT  Video end time: 0305h EDT   Total time spent in this encounter was 30 minutes, including record review, clinical interview, behavior observations, discussion of impressions and recommendations (including medications and hospitalization), and consultation/communication with relevant parties   IDENTIFYING DATA  [name] is a 53 y.o. year-old male for whom a psychiatric consultation has been ordered by the primary provider. The patient was identified using two separate identifiers.  CHIEF COMPLAINT/REASON FOR CONSULT   I can't take all this pain and suffering anymore   HISTORY OF PRESENT ILLNESS (HPI)  The patient reports a history of recurrent depression and anxiety for multiple years, which has only increased in intensity over the past few years because of multiple family losses, as well as chronic financial issues.  Patient has history of polysubstance use, but states that has been sober x 12 years.  Does use cannabis somewhat regularly, but denies other drug usage.  Has had several admission for suicidal thoughts through the years, most recently in July of this year.  Was discharged on Cymbalta  and  naltrexone , but states that could not afford meds, and did not get follow-up treatment.  In the past, has made plans to overdose on medication, although to date has sought help before taking any.  Has chronic pain issues, and in past has said that he feels distressed that he cannot get opiate medications for pain relief.  Patient states that his depression has been worsening over past few weeks.  Lives by himself, with only minimal support.  By this morning, was hearing voices telling him that he should kill himself, so sought evaluation for safety and treatment.  No homicidal ideation.   PAST PSYCHIATRIC HISTORY  As above Otherwise as per HPI above.  PAST MEDICAL HISTORY  Past Medical History:  Diagnosis Date   Alcohol  abuse    Anemia    Colitis    Depression    Severe major depression with psychotic features [facility])      HOME MEDICATIONS  Facility Ordered Medications  Medication   acetaminophen  (TYLENOL ) tablet 650 mg   [COMPLETED] methocarbamol  (ROBAXIN ) tablet 1,000 mg   PTA Medications  Medication Sig   nicotine  polacrilex (NICORETTE ) 2 MG gum Take 1 each (2 mg total) by mouth as needed for smoking cessation.   ondansetron  (ZOFRAN ) 4 MG tablet Take 1 tablet (4 mg total) by mouth every 6 (six) hours as needed for nausea.   None  ALLERGIES  Allergies  Allergen Reactions   Aspirin Hives and Nausea And Vomiting   Ibuprofen  Hives and Nausea And Vomiting   Porcine (Pork) Protein-Containing Drug Products Other (See Comments)    Stomach pain. Pt does not eat pork   Acetaminophen  Nausea Only   Sertraline  Diarrhea and Other (See Comments)    Acute GI distress with diarrhea and pain.     SOCIAL & SUBSTANCE USE HISTORY  Social History   Socioeconomic History   Marital status: Single    Spouse name: Not on file   Number of children: Not on file   Years of education: Not on file   Highest education level: Not on file  Occupational History   Not on file  Tobacco Use   Smoking  status: Every Day    Types: Cigarettes   Smokeless tobacco: Never  Vaping Use   Vaping status: Never Used  Substance and Sexual Activity   Alcohol  use: No   Drug use: Yes    Types: Marijuana   Sexual activity: Yes    Birth control/protection: Condom  Other Topics Concern   Not on file  Social History Narrative   Not on file   Social Drivers of Health   Financial Resource Strain: High Risk (03/19/2019)   Overall Financial Resource Strain (CARDIA)    Difficulty of Paying Living Expenses: Very hard  Food Insecurity: Food Insecurity Present (02/17/2024)   Hunger Vital Sign    Worried About Programme researcher, broadcasting/film/video in the Last Year: Often true    Ran Out of Food in the Last Year: Often true  Transportation Needs: Unmet Transportation Needs (02/17/2024)   PRAPARE - Administrator, Civil Service (Medical): Yes    Lack of Transportation (Non-Medical): Yes  Physical Activity: Not on file  Stress: Not on file  Social Connections: Patient Declined (06/30/2023)   Social Connection and Isolation Panel    Frequency of Communication with Friends and Family: Patient declined    Frequency of Social Gatherings with Friends and Family: Patient declined    Attends Religious Services: Patient declined    Database administrator or Organizations: Patient declined    Attends Banker Meetings:  Patient declined    Marital Status: Patient declined   Social History   Tobacco Use  Smoking Status Every Day   Types: Cigarettes  Smokeless Tobacco Never   Social History   Substance and Sexual Activity  Alcohol  Use No   Social History   Substance and Sexual Activity  Drug Use Yes   Types: Marijuana    Additional pertinent information Patient does get some financial help, apparently from some family members. SABRA  FAMILY HISTORY  Family History  Problem Relation Age of Onset   Hypertension Mother    Hypertension Father    Hypertension Other    Family Psychiatric  History (if known):  Denied  MENTAL STATUS EXAM (MSE)  Mental Status Exam: General Appearance: Fairly Groomed  Orientation:  Full (Time, Place, and Person)  Memory:  Immediate;   Fair Recent;   Fair Remote;   Fair  Concentration:  Concentration: Poor and Attention Span: Poor  Recall:  Fair  Attention  Poor  Eye Contact:  Minimal  Speech:  Slow  Language:  Good  Volume:  Normal  Mood: I don't want to live like this anymore  Affect:  Constricted and Depressed  Thought Process:  Descriptions of Associations: Circumstantial  Thought Content:  Rumination  Suicidal Thoughts:  Yes.  with intent/plan  Homicidal Thoughts:  No  Judgement:  Poor  Insight:  Shallow  Psychomotor Activity:  Decreased  Akathisia:  NA  Fund of Knowledge:  Fair    Assets:  Manufacturing systems engineer Desire for Improvement  Cognition:  WNL  ADL's:  Intact  AIMS (if indicated):       VITALS  Blood pressure (!) 147/91, pulse 75, temperature 98.2 F (36.8 C), temperature source Oral, resp. rate 16, height 5' 7 (1.702 m), weight 64 kg, SpO2 97%.  LABS  Admission on 02/22/2024  Component Date Value Ref Range Status   Sodium 02/22/2024 143  135 - 145 mmol/L Final   Potassium 02/22/2024 3.6  3.5 - 5.1 mmol/L Final   Chloride 02/22/2024 104  98 - 111 mmol/L Final   CO2 02/22/2024 28  22 - 32 mmol/L Final   Glucose, Bld 02/22/2024 119 (H)  70 - 99 mg/dL Final   Glucose reference range applies only to samples taken after fasting for at least 8 hours.   BUN 02/22/2024 11  6 - 20 mg/dL Final   Creatinine, Ser 02/22/2024 0.79  0.61 - 1.24 mg/dL Final   Calcium 89/78/7974 9.5  8.9 - 10.3 mg/dL Final   Total Protein 89/78/7974 7.2  6.5 - 8.1 g/dL Final   Albumin 89/78/7974 4.1  3.5 - 5.0 g/dL Final   AST 89/78/7974 19  15 - 41 U/L Final   ALT 02/22/2024 9  0 - 44 U/L Final   Alkaline Phosphatase 02/22/2024 55  38 - 126 U/L Final   Total Bilirubin 02/22/2024 0.3  0.0 - 1.2 mg/dL Final   GFR, Estimated 02/22/2024 >60   >60 mL/min Final   Comment: (NOTE) Calculated using the CKD-EPI Creatinine Equation (2021)    Anion gap 02/22/2024 11  5 - 15 Final   Performed at [facility], [address], [place], [place] [address]   Alcohol , Ethyl (B) 02/22/2024 <15  <15 mg/dL Final   Comment: (NOTE) For medical purposes only. Performed at [facility], [address] [place], [place] [address]    Opiates 02/22/2024 NEGATIVE  NEGATIVE Final   Cocaine 02/22/2024 NEGATIVE  NEGATIVE Final   Benzodiazepines  02/22/2024 NEGATIVE  NEGATIVE Final   Amphetamines 02/22/2024 NEGATIVE  NEGATIVE Final   Tetrahydrocannabinol 02/22/2024 POSITIVE (A)  NEGATIVE Final   Barbiturates 02/22/2024 NEGATIVE  NEGATIVE Final   Methadone Scn, Ur 02/22/2024 NEGATIVE  NEGATIVE Final   Fentanyl  02/22/2024 NEGATIVE  NEGATIVE Final   Comment: (NOTE) Drug screen is for Medical Purposes only. Positive results are preliminary only. If confirmation is needed, notify lab within 5 days.  Drug Class                 Cutoff (ng/mL) Amphetamine and metabolites 1000 Barbiturate and metabolites 200 Benzodiazepine              200 Opiates and metabolites     300 Cocaine and metabolites     300 THC                         50 Fentanyl                     5 Methadone                   300  Trazodone  is metabolized in vivo to several metabolites,  including pharmacologically active m-CPP, which is excreted in the  urine.  Immunoassay screens for amphetamines and MDMA have potential  cross-reactivity with these compounds and may provide false positive  result.  Performed at [facility], [address], [place], [place] [address]    WBC 02/22/2024 14.7 (H)  4.0 - 10.5 K/uL Final   RBC 02/22/2024 3.46 (L)  4.22 - 5.81 MIL/uL Final   Hemoglobin 02/22/2024 10.4 (L)  13.0 - 17.0 g/dL Final   HCT 89/78/7974 33.6 (L)  39.0 - 52.0 % Final   MCV 02/22/2024 97.1  80.0 - 100.0 fL Final   MCH  02/22/2024 30.1  26.0 - 34.0 pg Final   MCHC 02/22/2024 31.0  30.0 - 36.0 g/dL Final   RDW 89/78/7974 13.1  11.5 - 15.5 % Final   Platelets 02/22/2024 365  150 - 400 K/uL Final   nRBC 02/22/2024 0.0  0.0 - 0.2 % Final   Performed at [facility], [address] [place], [place] [address]    PSYCHIATRIC REVIEW OF SYSTEMS (ROS)  ROS: Notable for the following relevant positive findings: Review of Systems  Constitutional: Negative.   HENT: Negative.    Eyes: Negative.   Respiratory:  Positive for cough.   Cardiovascular: Negative.   Gastrointestinal: Negative.   Genitourinary: Negative.   Musculoskeletal:  Positive for back pain.  Skin: Negative.   Endo/Heme/Allergies: Negative.   Psychiatric/Behavioral:  Positive for depression, substance abuse and suicidal ideas. The patient is nervous/anxious and has insomnia.     Additional findings:      Musculoskeletal: No abnormal movements observed      Gait & Station: Laying/Sitting      Pain Screening: Present - severe (will consider referral for ongoing evaluation and treatment)       Nutrition & Dental Concerns: Reviewed  RISK FORMULATION/ASSESSMENT  Is the patient experiencing any suicidal or homicidal ideations: Yes       Explain if yes:   Patient continues with suicidal ideation, with command hallucinations earlier today.  Protective factors considered for safety management:   Patient is isolated in the community, with little reliable support.  Not in mental health treatment  Risk factors/concerns considered for safety management:  Depression Substance abuse/dependence Physical illness/chronic pain  Recent loss Access to lethal means Hopelessness Impulsivity Isolation Barriers to accessing treatment Male gender Unmarried  Is there a safety management plan with the patient and treatment team to minimize risk factors and promote protective factors: No            Explain: As above, little reliable  support in the community  Is crisis care placement or psychiatric hospitalization recommended: Yes     Based on my current evaluation and risk assessment, patient is determined at this time to be at:  High risk  *RISK ASSESSMENT Risk assessment is a dynamic process; it is possible that this patient's condition, and risk level, may change. This should be re-evaluated and managed over time as appropriate. Please re-consult psychiatric consult services if additional assistance is needed in terms of risk assessment and management. If your team decides to discharge this patient, please advise the patient how to best access emergency psychiatric services, or to call 911, if their condition worsens or they feel unsafe in any way.   Adriana JINNY Pontes, MD Telepsychiatry Consult Services

## 2024-02-23 NOTE — Assessment & Plan Note (Addendum)
 Multiple PACs on EKG Stable finding, when compared to EKG done on 02/15/2024 Heart rate in the high 30s to 60s without lightheadedness Will benefit from an echocardiogram and outpatient follow-up with cardiology for Holter monitoring at discharge Will place a cardiology consult for any additional recommendations

## 2024-02-24 ENCOUNTER — Encounter: Payer: Self-pay | Admitting: Psychiatry

## 2024-02-24 DIAGNOSIS — R45851 Suicidal ideations: Secondary | ICD-10-CM

## 2024-02-24 DIAGNOSIS — F329 Major depressive disorder, single episode, unspecified: Secondary | ICD-10-CM

## 2024-02-24 DIAGNOSIS — I491 Atrial premature depolarization: Secondary | ICD-10-CM

## 2024-02-24 DIAGNOSIS — J189 Pneumonia, unspecified organism: Secondary | ICD-10-CM

## 2024-02-24 DIAGNOSIS — M940 Chondrocostal junction syndrome [Tietze]: Secondary | ICD-10-CM

## 2024-02-24 LAB — COMPREHENSIVE METABOLIC PANEL WITH GFR
ALT: 13 U/L (ref 0–44)
AST: 22 U/L (ref 15–41)
Albumin: 3.4 g/dL — ABNORMAL LOW (ref 3.5–5.0)
Alkaline Phosphatase: 41 U/L (ref 38–126)
Anion gap: 12 (ref 5–15)
BUN: 18 mg/dL (ref 6–20)
CO2: 26 mmol/L (ref 22–32)
Calcium: 9.1 mg/dL (ref 8.9–10.3)
Chloride: 104 mmol/L (ref 98–111)
Creatinine, Ser: 0.98 mg/dL (ref 0.61–1.24)
GFR, Estimated: >60 mL/min (ref >60)
Glucose, Bld: 98 mg/dL (ref 70–99)
Potassium: 3.8 mmol/L (ref 3.5–5.1)
Sodium: 142 mmol/L (ref 135–145)
Total Bilirubin: 0.3 mg/dL (ref 0.0–1.2)
Total Protein: 6.7 g/dL (ref 6.5–8.1)

## 2024-02-24 LAB — TROPONIN I (HIGH SENSITIVITY)
Troponin I (High Sensitivity): 7 ng/L (ref <18)
Troponin I (High Sensitivity): 7 ng/L (ref <18)

## 2024-02-24 MED ORDER — DULOXETINE HCL 20 MG PO CPEP
20.0000 mg | ORAL_CAPSULE | Freq: Two times a day (BID) | ORAL | Status: DC
  Administered 2024-02-24 – 2024-02-27 (×7): 20 mg via ORAL
  Filled 2024-02-24 (×7): qty 1

## 2024-02-24 MED ORDER — NICOTINE 14 MG/24HR TD PT24
14.0000 mg | MEDICATED_PATCH | Freq: Every day | TRANSDERMAL | Status: DC
Start: 2024-02-24 — End: 2024-02-28
  Administered 2024-02-24 – 2024-02-28 (×5): 14 mg via TRANSDERMAL
  Filled 2024-02-24 (×5): qty 1

## 2024-02-24 MED ORDER — ENSURE PLUS HIGH PROTEIN PO LIQD
237.0000 mL | Freq: Two times a day (BID) | ORAL | Status: DC
  Administered 2024-02-24 – 2024-02-28 (×8): 237 mL via ORAL

## 2024-02-24 NOTE — H&P (Signed)
 Psychiatric Admission Assessment Adult  Patient Identification: [name] MRN:  [id number] Date of Evaluation:  02/24/2024 Chief Complaint:  MDD (major depressive disorder) [F32.9]   History of Present Illness: The patient reports a history of recurrent depression and anxiety for multiple years, which has only increased in intensity over the past few years because of multiple family losses, as well as chronic financial issues.   Patient has history of polysubstance use, but states that has been sober x 12 years.  Does use cannabis somewhat regularly, but denies other drug usage.  Has had several admission for suicidal thoughts through the years, most recently in July of this year.  Was discharged on Cymbalta  and naltrexone , but states that could not afford meds, and did not get follow-up treatment.  In the past, has made plans to overdose on medication, although to date has sought help before taking any.  Has chronic pain issues, and in past has said that he feels distressed that he cannot get opiate medications for pain relief.   Patient states that his depression has been worsening over past few weeks.  Lives by himself, with only minimal support.  By this morning, was hearing voices telling him that he should kill himself, so sought evaluation for safety and treatment.  No homicidal ideation.  The patient was admitted to [facility] inpatient behavioral health unit for further monitoring and stabilization.  Today when asked about suicidal thoughts, patient quoted I do not know, I have a lot of physical pain and worrying going on as well as congestion.  When asked to further elaborate patient denied active suicidal thoughts, but reported feeling overwhelmed currently.  Patient did require rapid response last night due to chest pain and elevated blood pressure and bradycardia.  Consult note from hospitalist as well as cardiology available in chart review.  Patient was evaluated by hospital MD team  as well as cardiology.  The following recommendations were given by cardiology team  ASSESSMENT AND PLAN:  [name] is a 53 y.o. male  with a past medical history of major depressive disorder, anemia, polysubstance abuse who presented to the ED on 02/23/2024 for suicidal ideation. Recently hospitalized from 10/14-10/16 for sepsis, multifocal pneumonia. Overnight on 02/23/24 he reported chest pain. Cardiology was consulted for further evaluation.    # Atypical chest pain # Costochondritis # Recent pneumonia # Major depressive disorder with suicidal ideation Patient initially admitted with suicidal ideation, treated for pneumonia last week and overnight complained of chest discomfort.  Describes this as worse with palpation and certain positional movements.  Troponins normal x 2.  EKG normal sinus rhythm with PACs, no evidence of bradycardia or other acute ischemic changes. - No further workup recommended from cardiac perspective. - He is asymptomatic with PACs, thus would not recommend any medication management for this.   Cardiology will sign off. Please haiku with questions or re-engage if needed.    This patient's plan of care was discussed and created with Dr. Wilburn and he is in agreement.  Patient currently denies homicidal ideations.  He denies auditory or visual hallucinations as well.  He did report ongoing financial hardships which have caused stress and feelings of being down and depressed.  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  He reported his license were taken away initially when he was around 53 years old due to charges related with drugs.  He denied any current pending legal charges.  He also reported issues with being able to afford food, he reported that his food stamps were recently taken away from him.  He did report he currently lives in section 8 housing.   Patient was calm and cooperative with this provider throughout assessment.  Total Time spent with patient: 1 hour Sleep  Sleep:Sleep: Fair  Past Psychiatric History: Hx of polysubstance abuse, depression Psychiatric History:  Information collected from Patient/chart review  Prev Dx/Sx: Polysubstance abuse, depression Current Psych Provider: Patient reported not currently aligned with outpatient provider Home Meds (current): Unable to afford medications that were prescribed to him previously Previous Med Trials: Sertraline - reported could not tolerate Therapy: Denied  Prior Psych Hospitalization: Yes  Prior Self Harm: Denied Prior Violence: Denied  Family Psych History: Unsure Family Hx suicide: Denied  Social History:  Developmental Hx: Met milestones as far as patient is aware Educational Hx: Unknown Occupational Hx: Currently unemployed Legal Hx: Previous drug charges, denied current charges Living Situation: Reported he lives in section 8 housing Spiritual Hx: Unknown Access to weapons/lethal means: Denied   Substance History Alcohol : Denied current use  Tobacco:Cigarettes daily Illicit drugs: THC use Prescription drug abuse: denied Rehab hx: denied Is the patient at risk to self? Yes.    Has the patient been a risk to self in the past 6 months? Yes.    Has the patient been a risk to self within the distant past? Yes.    Is the patient a risk to others? No.  Has the patient been a risk to others in the past 6 months? No.  Has the patient been a risk to others within the distant past? No.   [place] Scale:  Flowsheet Row Admission (Current) from 02/23/2024 in [facility] INPATIENT BEHAV[facility]AL MEDICINE ED from 02/22/2024 in [facility] Emergency Department at [facility] ED from 02/12/2024 in [facility] Emergency Department at [facility]  C-SSRS RISK CATEGORY Moderate Risk High Risk No Risk     Past Medical History:  Past Medical History:  Diagnosis  Date   Alcohol  abuse    Anemia    Colitis    Depression    Severe major depression with psychotic features (HCC)    History reviewed. No pertinent surgical history. Family History:  Family History  Problem Relation Age of Onset   Hypertension Mother    Hypertension Father    Hypertension Other     Social History:  Social History   Substance and Sexual Activity  Alcohol  Use No     Social History   Substance and Sexual Activity  Drug Use Yes   Types: Marijuana      Allergies:   Allergies  Allergen Reactions   Aspirin Hives and Nausea And Vomiting   Ibuprofen  Hives and Nausea And Vomiting   Porcine (Pork) Protein-Containing Drug Products Other (See Comments)    Stomach pain. Pt does not eat pork   Acetaminophen  Nausea Only   Sertraline  Diarrhea and Other (See Comments)    Acute GI distress with diarrhea and pain.    Lab Results:  Results for orders placed or performed during the hospital encounter of 02/23/24 (from the past 48 hours)  CBC with Differential/Platelet     Status: Abnormal   Collection Time: 02/23/24  9:09 PM  Result Value Ref Range   WBC 11.5 (H) 4.0 - 10.5 K/uL   RBC 3.59 (L) 4.22 - 5.81 MIL/uL   Hemoglobin 11.0 (L) 13.0 - 17.0  g/dL   HCT 65.7 (L) 60.9 - 47.9 %   MCV 95.3 80.0 - 100.0 fL   MCH 30.6 26.0 - 34.0 pg   MCHC 32.2 30.0 - 36.0 g/dL   RDW 86.9 88.4 - 84.4 %   Platelets 393 150 - 400 K/uL   nRBC 0.0 0.0 - 0.2 %   Neutrophils Relative % 57 %   Neutro Abs 6.7 1.7 - 7.7 K/uL   Lymphocytes Relative 29 %   Lymphs Abs 3.3 0.7 - 4.0 K/uL   Monocytes Relative 8 %   Monocytes Absolute 0.9 0.1 - 1.0 K/uL   Eosinophils Relative 4 %   Eosinophils Absolute 0.5 0.0 - 0.5 K/uL   Basophils Relative 1 %   Basophils Absolute 0.1 0.0 - 0.1 K/uL   Immature Granulocytes 1 %   Abs Immature Granulocytes 0.08 (H) 0.00 - 0.07 K/uL    Comment: Performed at [facility], [address] [place], [place] [address]  Comprehensive metabolic panel      Status: Abnormal   Collection Time: 02/23/24  9:09 PM  Result Value Ref Range   Sodium 142 135 - 145 mmol/L   Potassium 3.8 3.5 - 5.1 mmol/L   Chloride 104 98 - 111 mmol/L   CO2 26 22 - 32 mmol/L   Glucose, Bld 98 70 - 99 mg/dL    Comment: Glucose reference range applies only to samples taken after fasting for at least 8 hours.   BUN 18 6 - 20 mg/dL   Creatinine, Ser 9.01 0.61 - 1.24 mg/dL   Calcium 9.1 8.9 - 89.6 mg/dL   Total Protein 6.7 6.5 - 8.1 g/dL   Albumin 3.4 (L) 3.5 - 5.0 g/dL   AST 22 15 - 41 U/L   ALT 13 0 - 44 U/L   Alkaline Phosphatase 41 38 - 126 U/L   Total Bilirubin 0.3 0.0 - 1.2 mg/dL   GFR, Estimated >39 >39 mL/min    Comment: (NOTE) Calculated using the CKD-EPI Creatinine Equation (2021)    Anion gap 12 5 - 15    Comment: Performed at [facility], [address] [place], [place] [address]  D-dimer, quantitative     Status: None   Collection Time: 02/23/24  9:09 PM  Result Value Ref Range   D-Dimer, Quant 0.43 0.00 - 0.50 ug/mL-FEU    Comment: (NOTE) At the manufacturer cut-off value of 0.5 g/mL FEU, this assay has a negative predictive value of 95-100%.This assay is intended for use in conjunction with a clinical pretest probability (PTP) assessment model to exclude pulmonary embolism (PE) and deep venous thrombosis (DVT) in outpatients suspected of PE or DVT. Results should be correlated with clinical presentation. Performed at [facility], [address] [place], [place] [address]   Troponin I (High Sensitivity)     Status: None   Collection Time: 02/23/24  9:09 PM  Result Value Ref Range   Troponin I (High Sensitivity) 7 <18 ng/L    Comment: (NOTE) Elevated high sensitivity troponin I (hsTnI) values and significant  changes across serial measurements may suggest ACS but many other  chronic and acute conditions are known to elevate hsTnI results.  Refer to the Links section for chest pain algorithms and additional   guidance. Performed at [facility], [address] [place], [place] [address]   Procalcitonin     Status: None   Collection Time: 02/23/24  9:09 PM  Result Value Ref Range   Procalcitonin <0.10  ng/mL    Comment:        Interpretation: PCT (Procalcitonin) <= 0.5 ng/mL: Systemic infection (sepsis) is not likely. Local bacterial infection is possible. (NOTE)       Sepsis PCT Algorithm           Lower Respiratory Tract                                      Infection PCT Algorithm    ----------------------------     ----------------------------         PCT < 0.25 ng/mL                PCT < 0.10 ng/mL          Strongly encourage             Strongly discourage   discontinuation of antibiotics    initiation of antibiotics    ----------------------------     -----------------------------       PCT 0.25 - 0.50 ng/mL            PCT 0.10 - 0.25 ng/mL               OR       >80% decrease in PCT            Discourage initiation of                                            antibiotics      Encourage discontinuation           of antibiotics    ----------------------------     -----------------------------         PCT >= 0.50 ng/mL              PCT 0.26 - 0.50 ng/mL               AND        <80% decrease in PCT             Encourage initiation of                                             antibiotics       Encourage continuation           of antibiotics    ----------------------------     -----------------------------        PCT >= 0.50 ng/mL                  PCT > 0.50 ng/mL               AND         increase in PCT                  Strongly encourage                                      initiation of antibiotics    Strongly encourage escalation           of antibiotics                                     -----------------------------  PCT <= 0.25 ng/mL                                                 OR                                         > 80% decrease in PCT                                      Discontinue / Do not initiate                                             antibiotics  Performed at [facility], [address] [place], [place] [address]   TSH     Status: None   Collection Time: 02/23/24  9:09 PM  Result Value Ref Range   TSH 1.477 0.350 - 4.500 uIU/mL    Comment: Performed by a 3rd Generation assay with a functional sensitivity of <=0.01 uIU/mL. Performed at [facility], [address] [place], [place] [address]   Troponin I (High Sensitivity)     Status: None   Collection Time: 02/23/24 11:28 PM  Result Value Ref Range   Troponin I (High Sensitivity) 7 <18 ng/L    Comment: (NOTE) Elevated high sensitivity troponin I (hsTnI) values and significant  changes across serial measurements may suggest ACS but many other  chronic and acute conditions are known to elevate hsTnI results.  Refer to the Links section for chest pain algorithms and additional  guidance. Performed at [facility], [address] [place], [place] [address]     Blood Alcohol  level:  Lab Results  Component Value Date   [facility] <15 02/22/2024   [facility] <15 11/23/2023    Metabolic Disorder Labs:  Lab Results  Component Value Date   HGBA1C 5.2 08/27/2014   MPG 103 08/27/2014   MPG 105 03/08/2013   No results found for: PROLACTIN No results found for: CHOL, TRIG, HDL, CHOLHDL, VLDL, LDLCALC  Current Medications: Current Facility-Administered Medications  Medication Dose Route Frequency Provider Last Rate Last Admin   alum & mag hydroxide-simeth (MAALOX/MYLANTA) 200-200-20 MG/5ML suspension 30 mL  30 mL Oral Q4H PRN Nkwenti, Doris, NP       dextromethorphan-guaiFENesin (MUCINEX DM) 30-600 MG per 12 hr tablet 1 tablet  1 tablet Oral BID Duncan, Hazel V, MD   1 tablet at 02/24/24 0900   haloperidol  (HALDOL ) tablet 5 mg  5 mg Oral TID PRN Tex Drilling, NP       And    diphenhydrAMINE  (BENADRYL ) capsule 50 mg  50 mg Oral TID PRN Tex Drilling, NP       haloperidol  lactate (HALDOL ) injection 5 mg  5 mg Intramuscular TID PRN Tex Drilling, NP       And   diphenhydrAMINE  (BENADRYL ) injection 50 mg  50 mg Intramuscular TID PRN Tex Drilling, NP       And   LORazepam  (ATIVAN ) injection 2 mg  2 mg Intramuscular TID PRN Tex Drilling, NP  haloperidol  lactate (HALDOL ) injection 10 mg  10 mg Intramuscular TID PRN Tex Drilling, NP       And   diphenhydrAMINE  (BENADRYL ) injection 50 mg  50 mg Intramuscular TID PRN Tex Drilling, NP       And   LORazepam  (ATIVAN ) injection 2 mg  2 mg Intramuscular TID PRN Tex Drilling, NP       DULoxetine  (CYMBALTA ) DR capsule 20 mg  20 mg Oral BID Riyah Bardon B, NP       feeding supplement (ENSURE PLUS HIGH PROTEIN) liquid 237 mL  237 mL Oral BID BM Jadapalle, Sree, MD   237 mL at 02/24/24 0919   hydrOXYzine  (ATARAX ) tablet 25 mg  25 mg Oral TID PRN Tex Drilling, NP       magnesium  hydroxide (MILK OF MAGNESIA) suspension 30 mL  30 mL Oral Daily PRN Tex Drilling, NP       nicotine  (NICODERM CQ  - dosed in mg/24 hours) patch 14 mg  14 mg Transdermal Daily Jadapalle, Sree, MD   14 mg at 02/24/24 9082   oxyCODONE  (Oxy IR/ROXICODONE ) immediate release tablet 5 mg  5 mg Oral Q4H PRN Duncan, Hazel V, MD       traZODone  (DESYREL ) tablet 50 mg  50 mg Oral QHS PRN Tex Drilling, NP       PTA Medications: Medications Prior to Admission  Medication Sig Dispense Refill Last Dose/Taking   nicotine  polacrilex (NICORETTE ) 2 MG gum Take 1 each (2 mg total) by mouth as needed for smoking cessation. 100 tablet 0    ondansetron  (ZOFRAN ) 4 MG tablet Take 1 tablet (4 mg total) by mouth every 6 (six) hours as needed for nausea. 20 tablet 0     Psychiatric Specialty Exam:  Presentation  General Appearance:  Appropriate for Environment  Eye Contact: Good  Speech: Clear and Coherent  Speech Volume: Normal    Mood and  Affect  Mood: Anxious  Affect: Appropriate   Thought Process  Thought Processes: Coherent; Linear  Descriptions of Associations:Intact  Orientation:Full (Time, Place and Person)  Thought Content:WDL  Hallucinations:Hallucinations: None  Ideas of Reference:None  Suicidal Thoughts:Suicidal Thoughts: No  Homicidal Thoughts:Homicidal Thoughts: No   Sensorium  Memory: Immediate Fair; Recent Fair; Remote Fair  Judgment: Fair  Insight: Fair   Art therapist  Concentration: Fair  Attention Span: Fair  Recall: [employer] of Knowledge: Fair  Language: Fair   Psychomotor Activity  Psychomotor Activity:Psychomotor Activity: Normal   Assets  Assets: Manufacturing systems engineer; Desire for Improvement    Musculoskeletal: Strength & Muscle Tone: within normal limits Gait & Station: normal  Physical Exam: Physical Exam Neurological:     Mental Status: He is alert and oriented to person, place, and time.  Psychiatric:        Mood and Affect: Mood is anxious.        Speech: Speech normal.        Behavior: Behavior is cooperative.        Thought Content: Thought content is not paranoid or delusional. Thought content does not include homicidal or suicidal ideation. Thought content does not include homicidal or suicidal plan.    Review of Systems  Constitutional:  Negative for fever.  Respiratory:  Positive for cough.   Gastrointestinal:  Negative for abdominal pain, nausea and vomiting.  Neurological:  Negative for dizziness and headaches.  Psychiatric/Behavioral:  Positive for depression. Negative for hallucinations, substance abuse and suicidal ideas. The patient is nervous/anxious.    Blood pressure ROLLEN)  151/87, pulse 67, temperature 98 F (36.7 C), temperature source Oral, resp. rate 18, height 5' 7 (1.702 m), weight 51.7 kg, SpO2 100%. Body mass index is 17.85 kg/m.  Principal Diagnosis: MDD (major depressive disorder) Diagnosis:  Principal  Problem:   MDD (major depressive disorder) Active Problems:   Right leg pain   Sinus bradycardia   Chest pain   Underweight (BMI < 18.5)   History of sepsis secondary pneumonia (02/17/2024)   Finding of multiple premature atrial contractions by electrocardiography   History of substance abuse (HCC)   Chronic anemia   Clinical Decision Making:  Treatment Plan Summary:  Safety and Monitoring:             -- Voluntary admission to inpatient psychiatric unit for safety, stabilization and treatment             -- Daily contact with patient to assess and evaluate symptoms and progress in treatment             -- Patient's case to be discussed in multi-disciplinary team meeting             -- Observation Level: q15 minute checks             -- Vital signs:  q12 hours             -- Precautions: suicide, elopement, and assault   2. Psychiatric Diagnoses and Treatment:    -Patient agreeable to starting Cymbalta  20 mg 2 times daily to help target depression symptoms.  We discussed sertraline  initially due to its favorable cardiac profile, but patient reported he had an allergy to this medication and it is listed in patient's known allergies that patient has GI side effects from this medication.  Cymbalta  recommendation was discussed with cardiology team who came to assess patient and reported that patient's EKG was reassuring and that Cymbalta  would be a safe option to prescribe patient in regards to cardiac profile.            -- The risks/benefits/side-effects/alternatives to this medication were discussed in detail with the patient and time was given for questions. The patient consents to medication trial.                -- Metabolic profile and EKG monitoring obtained while on an atypical antipsychotic (BMI: Lipid Panel: HbgA1c: QTc:)              -- Encouraged patient to participate in unit milieu and in scheduled group therapies                            3. Medical Issues Being  Addressed:  Patient required rapid response last night due to elevated blood pressure, decreased heart rate, and feeling unwell.  Patient was assessed by hospital MD as well as cardiology.  Their notes can be found in patient chart review.  We will continue to monitor patient's blood pressure while on the unit as patient has had some elevated readings-mainly systolically while on the inpatient unit.  We will continue to reach out to hospital MD team for further recommendations.   4. Discharge Planning:              -- Social work and case management to assist with discharge planning and identification of hospital follow-up needs prior to discharge             -- Estimated LOS: 5-7 days             --  Discharge Concerns: Need to establish a safety plan; Medication compliance and effectiveness             -- Discharge Goals: Return home with outpatient referrals follow ups  Physician Treatment Plan for Primary Diagnosis: MDD (major depressive disorder) Long Term Goal(s): Improvement in symptoms so as ready for discharge  Short Term Goals: Ability to identify changes in lifestyle to reduce recurrence of condition will improve, Ability to verbalize feelings will improve, Ability to disclose and discuss suicidal ideas, Ability to demonstrate self-control will improve, Ability to identify and develop effective coping behaviors will improve, and Ability to maintain clinical measurements within normal limits will improve  Physician Treatment Plan for Secondary Diagnosis: Principal Problem:   MDD (major depressive disorder) Active Problems:   Right leg pain   Sinus bradycardia   Chest pain   Underweight (BMI < 18.5)   History of sepsis secondary pneumonia (02/17/2024)   Finding of multiple premature atrial contractions by electrocardiography   History of substance abuse (HCC)   Chronic anemia  Long Term Goal(s): Improvement in symptoms so as ready for discharge  Short Term Goals: Ability to  identify changes in lifestyle to reduce recurrence of condition will improve, Ability to verbalize feelings will improve, Ability to disclose and discuss suicidal ideas, Ability to demonstrate self-control will improve, Ability to identify and develop effective coping behaviors will improve, and Ability to maintain clinical measurements within normal limits will improve  I certify that inpatient services furnished can reasonably be expected to improve the patient's condition.    Zelda Sharps, NP

## 2024-02-24 NOTE — [facility] Counselor (Signed)
 Adult Comprehensive Assessment  Patient ID: [name], male   DOB: [birth year], 53 y.o.   MRN: [id number]  Information Source: Information source: Patient (Previous PSA from 11/23/23 encounter.)  Current Stressors:  Patient states their primary concerns and needs for treatment are:: Bad thoughts. Patient states their goals for this hospitilization and ongoing recovery are:: Figure some things out and rest. Educational / Learning stressors: None reported Employment / Job issues: Pt is unemployed Family Relationships: None reported Surveyor, quantity / Lack of resources (include bankruptcy): My finances are all messed up right now. I can't even afford to get my license back. Housing / Lack of housing: None reported Physical health (include injuries & life threatening diseases): Chronic pain Social relationships: None reported Substance abuse: None reported Bereavement / Loss: Still dealing with big losses over the years--my fiancee and two children of four.  Living/Environment/Situation:  Living Arrangements: Alone Living conditions (as described by patient or guardian): I live alone. Who else lives in the home?: Pt reported that he lives alone. How long has patient lived in current situation?: Maybe about eight years. What is atmosphere in current home: Comfortable  Family History:  Marital status: Single Are you sexually active?: Yes What is your sexual orientation?: heterosexual Has your sexual activity been affected by drugs, alcohol , medication, or emotional stress?: N/A Does patient have children?: Yes How many children?: 4 (Two deceased and two living) How is patient's relationship with their children?: Good relationship with living children. Per previous PSA, Awesome, my oldest is deceased and the relationship was beautiful, a lot of resentment at not being able to attend the service.  Childhood History:  By whom was/is the patient raised?: Both  parents Additional childhood history information: I had a great childhood. Description of patient's relationship with caregiver when they were a child: Spoiled Patient's description of current relationship with people who raised him/her: Pt reported that his parents are deceased. How were you disciplined when you got in trouble as a child/adolescent?: I got my ass whooped and on punishment. Does patient have siblings?: Yes Number of Siblings: 3 (one brother and two sisters.) Description of patient's current relationship with siblings: Good. Did patient suffer any verbal/emotional/physical/sexual abuse as a child?: No Did patient suffer from severe childhood neglect?: No Has patient ever been sexually abused/assaulted/raped as an adolescent or adult?: No Was the patient ever a victim of a crime or a disaster?: No Witnessed domestic violence?: Yes Has patient been affected by domestic violence as an adult?: No Description of domestic violence: I saw a lot of things growing up in [place]  Education:  Highest grade of school patient has completed: Pt completed 11th grade and then went to [employer]. 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?: No  Financial Resources:   Financial resources: Medicaid Does patient have a Lawyer or guardian?: No  Alcohol /Substance Abuse:   What has been your use of drugs/alcohol  within the last 12 months?: I smoke a lot of weed. Pt reported that he wants to quit. He shared that he has been off alcohol  for 12 years or more although previously noted he reported 'I drink alcohol  and smoke marijuana everyday.' If attempted suicide, did  drugs/alcohol  play a role  in this?: Yes (Previously noted, pt endorsed plan to drink himself to death.) Alcohol /Substance Abuse Treatment Hx: Past Tx, Inpatient If yes, describe treatment: [facility] in 2014. Has alcohol /substance abuse ever caused legal problems?: No  Social Support System:   Patient's Community Support System: None Describe Community Support System: I don't really have much support at all. He shared that his family talks to him but none of them are located here in [place]. Type of faith/religion: I'm definitely a believer. How does patient's faith help to cope with current illness?: I look at it as a way of life.  Leisure/Recreation:   Do You Have Hobbies?: Yes Leisure and Hobbies: Cooking, reading  Strengths/Needs:   What is the patient's perception of their strengths?: I like to think everything I do, I do well especially something I enjoy doing. Patient states they can use these personal strengths during their treatment to contribute to their recovery: I don't really know right now. Patient states these barriers may affect/interfere with their treatment: Pt denied any barriers. Patient states these barriers may affect their return to the community: Pt denied any barriers. Other important information patient would like considered in planning for their treatment: I need a PCP.  Discharge Plan:   Currently receiving community mental health services: No Patient states concerns and preferences for aftercare planning are: Pt open to referral for continued medication management and therapy. Patient states they will know when they are safe and ready for discharge when: I'll know. Does patient have access to transportation?: No Does patient have financial barriers related to discharge medications?: No Patient description of barriers related to discharge medications: Pt has medicaid. Plan for no access to transportation at discharge: Pt to receive taxi  voucher upon discharge. Will patient be returning to same living situation after discharge?: Yes  Summary/Recommendations:   Summary and Recommendations (to be completed by the evaluator): Patient is a 53 year old, single, male from [place], [place] [facility] [place]). He reported that he came into the hospital because of "bad thoughts." Per chart review pt reported that he was hearing voices telling him to end his life. He reported he goal for this hospitalization remains the same previous admission, to "figure some things out and rest." Stressors identified as financial hardships, being unemployed, chronic pain, and grief around the loss of his fiance and two children. Pt lives at home by himself and plans to return there upon discharge. He denied any history of abuse, neglect, or trauma. Pt reported that he smokes marijuana daily. He denied any use of alcohol , reporting that he has not drunk in 12 or more years. Despite reporting alcohol  use during previous admission. Pt denied receiving any outpatient based mental health services currently but shared that they are open to referral for services upon discharge.  Recommendations include: crisis stabilization, therapeutic milieu, encourage group attendance and participation, medication management for mood stabilization and development of a comprehensive mental wellness/sobriety plan.  Nadara JONELLE Fam. 02/24/2024

## 2024-02-24 NOTE — Plan of Care (Signed)
  Problem: Nutrition: Goal: Adequate nutrition will be maintained Outcome: Progressing   Problem: Safety: Goal: Ability to remain free from injury will improve Outcome: Progressing   

## 2024-02-24 NOTE — Progress Notes (Signed)
 Patient presents: Presents irritable at times reporting being upset over not receiving ensure or nicotine  patch and questioning his medication. Reviewed medications and ordered ensure and nicotine  patch per standing orders. Patient much calmer at this time and med compliant.    SI/HI/AVH: Denies   Plan: Denies   Groups attended: 2/3   Appetite: Adequate. Attended meals.   Sleep: Patient had interrupted sleep due to rapid response event last night.   PRNS: N/A   Disturbances: Patient stepped out his room annoyed because why is air flowing in my room. Its cold. Temperature adjusted with room feeling much warmer. No further issues reported.   Questions/concerns: No further questions or concerns.   VS: BP 122/76 (BP Location: Right Arm)   Pulse 67   Temp 98 F (36.7 C) (Oral)   Resp 18   Ht 5' 7 (1.702 m)   Wt 51.7 kg   SpO2 100%   BMI 17.85 kg/m

## 2024-02-24 NOTE — Group Note (Signed)
 Recreation Therapy Group Note   Group Topic:Coping Skills  Group Date: 02/24/2024 Start Time: 1000 End Time: 1055 Facilitators: Celestia Jeoffrey BRAVO, LRT, CTRS Location: Craft Room  Group Description: Mind Map.  Patient was provided a blank template of a diagram with 32 blank boxes in a tiered system, branching from the center (similar to a bubble chart). LRT directed patients to label the middle of the diagram Coping Skills. LRT and patients then came up with 8 different coping skills as examples. Pt were directed to record their coping skills in the 2nd tier boxes closest to the center.  Patients would then share their coping skills with the group as LRT wrote them out. LRT gave a handout of 99 different coping skills at the end of group.   Goal Area(s) Addressed: Patients will be able to define "coping skills". Patient will identify new coping skills.  Patient will increase communication.  Affect/Mood: Appropriate   Participation Level: Minimal    Clinical Observations/Individualized Feedback: Amedee came late to group. Pt was present for the last half of group.   Plan: Continue to engage patient in RT group sessions 2-3x/week.   Jeoffrey BRAVO Celestia, LRT, CTRS 02/24/2024 11:25 AM

## 2024-02-24 NOTE — Progress Notes (Signed)
   02/24/24 1936  Psych Admission Type (Psych Patients Only)  Admission Status Voluntary  Psychosocial Assessment  Patient Complaints Other (Comment) (wishes to speak with chaplain)  Eye Contact Fair  Facial Expression Anxious  Affect Appropriate to circumstance  Speech Logical/coherent  Interaction Assertive  Motor Activity Slow  Appearance/Hygiene Unremarkable  Behavior Characteristics Cooperative  Mood Pleasant  Thought Process  Coherency WDL  Content WDL  Delusions None reported or observed  Perception WDL  Hallucination None reported or observed  Judgment Poor  Confusion None  Danger to Self  Current suicidal ideation? Denies  Self-Injurious Behavior No self-injurious ideation or behavior indicators observed or expressed   Agreement Not to Harm Self Yes  Description of Agreement verbal  Danger to Others  Danger to Others None reported or observed

## 2024-02-24 NOTE — Group Note (Signed)
 [facility] LCSW Group Therapy Note   Group Date: 02/24/2024 Start Time: 1300 End Time: 1345   Type of Therapy/Topic:  Group Therapy:  Balance in Life  Participation Level:  Did Not Attend   Description of Group:    This group will address the concept of balance and how it feels and looks when one is unbalanced. Patients will be encouraged to process areas in their lives that are out of balance, and identify reasons for remaining unbalanced. Facilitators will guide patients utilizing problem- solving interventions to address and correct the stressor making their life unbalanced. Understanding and applying boundaries will be explored and addressed for obtaining  and maintaining a balanced life. Patients will be encouraged to explore ways to assertively make their unbalanced needs known to significant others in their lives, using other group members and facilitator for support and feedback.  Therapeutic Goals: Patient will identify two or more emotions or situations they have that consume much of in their lives. Patient will identify signs/triggers that life has become out of balance:  Patient will identify two ways to set boundaries in order to achieve balance in their lives:  Patient will demonstrate ability to communicate their needs through discussion and/or role plays  Summary of Patient Progress: Patient did not attend group.    Therapeutic Modalities:   Cognitive Behavioral Therapy Solution-Focused Therapy Assertiveness Training   Nadara JONELLE Fam, LCSW

## 2024-02-24 NOTE — Consult Note (Signed)
 [facility] CLINIC CARDIOLOGY CONSULT NOTE       Patient ID: [name] MRN: [id number] DOB/AGE: 54/01/1971 54 y.o.  Admit date: 02/23/2024 Referring Physician Dr. Delayne Solian Primary Physician Patient, No Pcp Per  Primary Cardiologist None Reason for Consultation Chest pain  HPI: Travares Nelles is a 53 y.o. male  with a past medical history of major depressive disorder, anemia, polysubstance abuse who presented to the ED on 02/23/2024 for suicidal ideation. Recently hospitalized from 10/14-10/16 for sepsis, multifocal pneumonia. Overnight on 02/23/24 he reported chest pain. Cardiology was consulted for further evaluation.   Patient states that for the last few days since he was treated for pneumonia last week he has noticed chest discomfort.  This was worse last night.  He was initially admitted to [facility] behavioral unit from [employer] long ED for reports of suicidal ideation.  Workup in the ED notable for creatinine 0.98, potassium 3.8, hemoglobin 11.0, WBC 11.5. Troponins 7 > 7.  EKG done overnight after complaints of chest pain with sinus rhythm, PACs -no evidence of bradycardia or other concerning abnormalities. CTA chest 10/15 with multifocal pneumonia, CXR on 10/21 with no acute abnormality.   At the time of my evaluation this AM, he is resting in hospital bed.  We discussed his symptoms in further detail.  He endorses chest pain which is worse with palpitation for the last few days.  States that he did have significant cough with productive sputum last week.  States the pain is worse with certain positional movements as well.  He is unable to describe his  pain further.  Review of systems complete and found to be negative unless listed above    Past Medical History:  Diagnosis Date   Alcohol  abuse    Anemia    Colitis    Depression    Severe major depression with psychotic features (HCC)     History reviewed. No pertinent surgical history.  Medications Prior to Admission   Medication Sig Dispense Refill Last Dose/Taking   nicotine  polacrilex (NICORETTE ) 2 MG gum Take 1 each (2 mg total) by mouth as needed for smoking cessation. 100 tablet 0    ondansetron  (ZOFRAN ) 4 MG tablet Take 1 tablet (4 mg total) by mouth every 6 (six) hours as needed for nausea. 20 tablet 0    Social History   Socioeconomic History   Marital status: Single    Spouse name: Not on file   Number of children: Not on file   Years of education: Not on file   Highest education level: Not on file  Occupational History   Not on file  Tobacco Use   Smoking status: Every Day    Types: Cigarettes   Smokeless tobacco: Never  Vaping Use   Vaping status: Never Used  Substance and Sexual Activity   Alcohol  use: No   Drug use: Yes    Types: Marijuana   Sexual activity: Yes    Birth control/protection: Condom  Other Topics Concern   Not on file  Social History Narrative   Not on file   Social Drivers of Health   Financial Resource Strain: High Risk (03/19/2019)   Overall Financial Resource Strain (CARDIA)    Difficulty of Paying Living Expenses: Very hard  Food Insecurity: Food Insecurity Present (02/23/2024)   Hunger Vital Sign    Worried About Running Out of Food in the Last Year: Often true    Ran Out of Food in the Last Year: Often true  Transportation Needs: Unmet  Transportation Needs (02/23/2024)   PRAPARE - Administrator, Civil Service (Medical): Yes    Lack of Transportation (Non-Medical): Yes  Physical Activity: Not on file  Stress: Not on file  Social Connections: Patient Declined (06/30/2023)   Social Connection and Isolation Panel    Frequency of Communication with Friends and Family: Patient declined    Frequency of Social Gatherings with Friends and Family: Patient declined    Attends Religious Services: Patient declined    Active Member of Clubs or Organizations: Patient declined    Attends Banker Meetings: Patient declined    Marital  Status: Patient declined  Intimate Partner Violence: Not At Risk (02/23/2024)   Humiliation, Afraid, Rape, and Kick questionnaire    Fear of Current or Ex-Partner: No    Emotionally Abused: No    Physically Abused: No    Sexually Abused: No    Family History  Problem Relation Age of Onset   Hypertension Mother    Hypertension Father    Hypertension Other      Vitals:   02/24/24 0621 02/24/24 0904 02/24/24 0905 02/24/24 0906  BP: (!) 148/53   (!) 151/87  Pulse: (!) 34 (!) 38 (!) 40 67  Resp: (!) 22   18  Temp: 97.9 F (36.6 C)   98 F (36.7 C)  TempSrc: Oral   Oral  SpO2: 100%   100%  Weight:      Height:        PHYSICAL EXAM General: Well-appearing male, well nourished, in no acute distress. HEENT: Normocephalic and atraumatic. Neck: No JVD.  Lungs: Normal respiratory effort on room air. Clear bilaterally to auscultation. No wheezes, crackles, rhonchi.  Heart: HRRR. Normal S1 and S2 without gallops or murmurs.  Abdomen: Non-distended appearing.  Msk: Normal strength and tone for age. Extremities: Warm and well perfused. No clubbing, cyanosis.  No edema.  Neuro: Alert and oriented X 3. Psych: Answers questions appropriately.   Labs: Basic Metabolic Panel: Recent Labs    02/22/24 1850 02/23/24 2109  NA 143 142  K 3.6 3.8  CL 104 104  CO2 28 26  GLUCOSE 119* 98  BUN 11 18  CREATININE 0.79 0.98  CALCIUM 9.5 9.1   Liver Function Tests: Recent Labs    02/22/24 1850 02/23/24 2109  AST 19 22  ALT 9 13  ALKPHOS 55 41  BILITOT 0.3 0.3  PROT 7.2 6.7  ALBUMIN 4.1 3.4*   No results for input(s): LIPASE, AMYLASE in the last 72 hours. CBC: Recent Labs    02/22/24 2120 02/23/24 2109  WBC 14.7* 11.5*  NEUTROABS  --  6.7  HGB 10.4* 11.0*  HCT 33.6* 34.2*  MCV 97.1 95.3  PLT 365 393   Cardiac Enzymes: Recent Labs    02/23/24 2109 02/23/24 2328  TROPONINIHS 7 7   BNP: No results for input(s): BNP in the last 72 hours. D-Dimer: Recent Labs     02/23/24 2109  DDIMER 0.43   Hemoglobin A1C: No results for input(s): HGBA1C in the last 72 hours. Fasting Lipid Panel: No results for input(s): CHOL, HDL, LDLCALC, TRIG, CHOLHDL, LDLDIRECT in the last 72 hours. Thyroid Function Tests: Recent Labs    02/23/24 2109  TSH 1.477   Anemia Panel: No results for input(s): VITAMINB12, FOLATE, FERRITIN, TIBC, IRON, RETICCTPCT in the last 72 hours.   Radiology: DG Chest 2 View Result Date: 02/22/2024 CLINICAL DATA:  Chest pain syncope EXAM: CHEST - 2 VIEW COMPARISON:  02/16/2024  FINDINGS: The heart size and mediastinal contours are within normal limits. Both lungs are clear. The visualized skeletal structures are unremarkable. IMPRESSION: No active cardiopulmonary disease. Electronically Signed   By: Luke Bun M.D.   On: 02/22/2024 21:56   VAS US  LOWER EXTREMITY VENOUS (DVT) Result Date: 02/16/2024  Lower Venous DVT Study Patient Name:  DARNELLE CORP  Date of Exam:   02/16/2024 Medical Rec #: [id number]         Accession #:    [id number] Date of Birth: May 25, 1970         Patient Gender: M Patient Age:   16 years Exam Location:  [facility] Procedure:      VAS US  LOWER EXTREMITY VENOUS (DVT) Referring Phys: MAXIMINO SHARPS --------------------------------------------------------------------------------  Indications: SOB, and chest pain, leg pain.  Comparison Study: Previous study on 9.9.2010. Performing Technologist: Edilia Elden Appl  Examination Guidelines: A complete evaluation includes B-mode imaging, spectral Doppler, color Doppler, and power Doppler as needed of all accessible portions of each vessel. Bilateral testing is considered an integral part of a complete examination. Limited examinations for reoccurring indications may be performed as noted. The reflux portion of the exam is performed with the patient in reverse Trendelenburg.   +---------+---------------+---------+-----------+----------+--------------+ RIGHT    CompressibilityPhasicitySpontaneityPropertiesThrombus Aging +---------+---------------+---------+-----------+----------+--------------+ CFV      Full           Yes      Yes                                 +---------+---------------+---------+-----------+----------+--------------+ SFJ      Full           Yes      Yes                                 +---------+---------------+---------+-----------+----------+--------------+ FV Prox  Full                                                        +---------+---------------+---------+-----------+----------+--------------+ FV Mid   Full                                                        +---------+---------------+---------+-----------+----------+--------------+ FV DistalFull                                                        +---------+---------------+---------+-----------+----------+--------------+ PFV      Full                                                        +---------+---------------+---------+-----------+----------+--------------+ POP      Full           Yes      Yes                                 +---------+---------------+---------+-----------+----------+--------------+  PTV      Full                                                        +---------+---------------+---------+-----------+----------+--------------+ PERO     Full                                                        +---------+---------------+---------+-----------+----------+--------------+   +---------+---------------+---------+-----------+----------+--------------+ LEFT     CompressibilityPhasicitySpontaneityPropertiesThrombus Aging +---------+---------------+---------+-----------+----------+--------------+ CFV      Full           Yes      Yes                                  +---------+---------------+---------+-----------+----------+--------------+ SFJ      Full           Yes      Yes                                 +---------+---------------+---------+-----------+----------+--------------+ FV Prox  Full                                                        +---------+---------------+---------+-----------+----------+--------------+ FV Mid   Full                                                        +---------+---------------+---------+-----------+----------+--------------+ FV DistalFull                                                        +---------+---------------+---------+-----------+----------+--------------+ PFV      Full                                                        +---------+---------------+---------+-----------+----------+--------------+ POP      Full           Yes      Yes                                 +---------+---------------+---------+-----------+----------+--------------+ PTV      Full                                                        +---------+---------------+---------+-----------+----------+--------------+  PERO     Full                                                        +---------+---------------+---------+-----------+----------+--------------+     Summary: BILATERAL: - No evidence of deep vein thrombosis seen in the lower extremities, bilaterally. -No evidence of popliteal cyst, bilaterally.   *See table(s) above for measurements and observations. Electronically signed by Gaile New MD on 02/16/2024 at 4:24:58 PM.    Final    CT Angio Chest PE W and/or Wo Contrast Result Date: 02/16/2024 EXAM: CTA CHEST 02/16/2024 04:06:48 AM TECHNIQUE: CTA of the chest was performed without and with the administration of 65 mL of iohexol  (OMNIPAQUE ) 350 MG/ML injection. Multiplanar reformatted images are provided for review. MIP images are provided for review. Automated exposure control, iterative  reconstruction, and/or weight based adjustment of the mA/kV was utilized to reduce the radiation dose to as low as reasonably achievable. COMPARISON: AP and lateral chest series from today, AP and lateral chest 02/12/2024, PA chest 06/06/2021, and CTA chest 04/30/2017. CLINICAL HISTORY: Pulmonary embolism (PE) suspected, high prob. 65cc [id number]; Pt complains of cough- productive with yellow mucous, fevers, body aches. Onset 4 days ago. Daily smoker, no hx of COPD/asthma. FINDINGS: PULMONARY ARTERIES: Pulmonary arteries are adequately opacified for evaluation. No acute pulmonary embolus. Main pulmonary artery is normal in caliber. MEDIASTINUM: The heart and pericardium demonstrate no acute abnormality. There is no acute abnormality of the thoracic aorta. The trachea, both main bronchi, and thoracic esophagus are unremarkable. LYMPH NODES: Mildly prominent right hilar lymph nodes up to 1.2 cm in short axis. Subcarinal lymph nodes up to 1.4 cm. These lymph nodes are probably reactive. No other adenopathy is seen, including both axillae. LUNGS AND PLEURA: Respiratory motion limits fine detail in the lungs. There are minor changes of paraseptal emphysema in the lung apices. There is diffuse bronchial wall thickening. There is peribronchovascular hazy as well as nodular airspace disease in the right upper lobe, the left upper lobe lingula, in the infrahilar areas of the right middle lobe and in a patchy distribution in the basal segments of both of the lower lobes. The remaining lungs are clear. There is no pleural effusion, thickening, or pneumothorax. UPPER ABDOMEN: Limited images of the upper abdomen are unremarkable. SOFT TISSUES AND BONES: No acute bone or soft tissue abnormality. IMPRESSION: 1. No pulmonary embolism or  arterial dilatation is seen . 2. Multifocal peribronchovascular hazy and nodular airspace disease involving the right upper lobe, lingula, right middle lobe infrahilar region, and basal segments of  both lower lobes, compatible with multifocal infectious/inflammatory process such as bronchopneumonia. A follow-up chest CT is recommended after treatment to ensure clearing . 3. Mildly enlarged right hilar and subcarinal lymph nodes, likely reactive. Electronically signed by: Francis Quam MD 02/16/2024 04:44 AM EDT RP Workstation: [id number]   DG Chest 2 View Result Date: 02/16/2024 CLINICAL DATA:  Cough EXAM: CHEST - 2 VIEW COMPARISON:  02/12/2024 FINDINGS: Heart mediastinal contours are within normal limits. Right lung clear. Left lower lobe airspace opacity concerning for pneumonia. No effusions. No acute bony abnormality. IMPRESSION: Left lower lobe opacity concerning for pneumonia. Electronically Signed   By: Franky Crease M.D.   On: 02/16/2024 00:29   DG Chest 2 View Result Date: 02/12/2024 CLINICAL DATA:  Cough, congestion EXAM: CHEST -  2 VIEW COMPARISON:  06/06/2021 FINDINGS: The heart size and mediastinal contours are within normal limits. Both lungs are clear. The visualized skeletal structures are unremarkable. IMPRESSION: No active cardiopulmonary disease. Electronically Signed   By: Franky Crease M.D.   On: 02/12/2024 21:49    TELEMETRY (personally reviewed): Not on telemetry  EKG (personally reviewed): Normal sinus rhythm, PACs  Data reviewed by me 02/24/2024: last 24h vitals tele labs imaging I/O ED provider note, admission H&P  Principal Problem:   MDD (major depressive disorder) Active Problems:   Right leg pain   Sinus bradycardia   Chest pain   Underweight (BMI < 18.5)   History of sepsis secondary pneumonia (02/17/2024)   Finding of multiple premature atrial contractions by electrocardiography   History of substance abuse (HCC)   Chronic anemia    ASSESSMENT AND PLAN:  Deaken Jurgens is a 53 y.o. male  with a past medical history of major depressive disorder, anemia, polysubstance abuse who presented to the ED on 02/23/2024 for suicidal ideation. Recently  hospitalized from 10/14-10/16 for sepsis, multifocal pneumonia. Overnight on 02/23/24 he reported chest pain. Cardiology was consulted for further evaluation.   # Atypical chest pain # Costochondritis # Recent pneumonia # Major depressive disorder with suicidal ideation Patient initially admitted with suicidal ideation, treated for pneumonia last week and overnight complained of chest discomfort.  Describes this as worse with palpation and certain positional movements.  Troponins normal x 2.  EKG normal sinus rhythm with PACs, no evidence of bradycardia or other acute ischemic changes. - No further workup recommended from cardiac perspective. - He is asymptomatic with PACs, thus would not recommend any medication management for this.  Cardiology will sign off. Please haiku with questions or re-engage if needed.   This patient's plan of care was discussed and created with Dr. Wilburn and he is in agreement.  Signed: Danita Bloch, PA-C  02/24/2024, 12:04 PM [facility]

## 2024-02-24 NOTE — Group Note (Signed)
 Recreation Therapy Group Note   Group Topic:General Recreation  Group Date: 02/24/2024 Start Time: 1520 End Time: 1620 Facilitators: Celestia Jeoffrey BRAVO, LRT, CTRS Location: Courtyard  Group Description: [employer]. LRT and patients played games of basketball, drew with chalk, and played corn hole while outside in the courtyard while getting fresh air and sunlight. Music was being played in the background. LRT and peers conversed about different games they have played before, what they do in their free time and anything else that is on their minds. LRT encouraged pts to drink water after being outside, sweating and getting their heart rate up.  Goal Area(s) Addressed: Patient will build on frustration tolerance skills. Patients will partake in a competitive play game with peers. Patients will gain knowledge of new leisure interest/hobby.   Affect/Mood: Appropriate   Participation Level: Active   Participation Quality: Independent   Behavior: Appropriate   Speech/Thought Process: Coherent   Insight: Good   Judgement: Good   Modes of Intervention: Activity   Patient Response to Interventions:  Receptive   Education Outcome:  Acknowledges education   Clinical Observations/Individualized Feedback: [name] was active in their participation of session activities and group discussion. Pt interacted well with LRT and peers duration of session.    Plan: Continue to engage patient in RT group sessions 2-3x/week.   Jeoffrey BRAVO Celestia, LRT, CTRS 02/24/2024 5:21 PM

## 2024-02-24 NOTE — Group Note (Signed)
 Date:  02/24/2024 Time:  11:15 PM  Group Topic/Focus:  Coping With Mental Health Crisis:   The purpose of this group is to help patients identify strategies for coping with mental health crisis.  Group discusses possible causes of crisis and ways to manage them effectively.    Participation Level:  Active  Participation Quality:  Appropriate  Affect:  Appropriate  Cognitive:  Appropriate  Insight: Appropriate  Engagement in Group:  Engaged  Modes of Intervention:  Education  Additional Comments:    Luciano Emilio CROME 02/24/2024, 11:15 PM

## 2024-02-24 NOTE — Progress Notes (Signed)
 PROGRESS NOTE    [name]  FMW:[id number] DOB: 01-Oct-1970 DOA: 02/23/2024 PCP: Patient, No Pcp Per  Brief Narrative:   53 y.o. male, admitted to the behavioral health unit with suicidal ideation being seen in urgent consultation for chest pain and narcs as an overhead rapid response.  Patient, has a past medical history is significant for depression, anemia, polysubstance abuse, recently hospitalized from 10/14 to 02/17/2024 with sepsis secondary to pneumonia after presenting with cough, chest pain, cold sweats and right leg pain,  and with chest CTA showing multifocal pneumonia, without evidence of PE, subsequently rehospitalized to [facility] on 02/22/2024 with suicidal ideation.   10/23: chest pain resolved   Assessment & Plan:   Principal Problem:   MDD (major depressive disorder) Active Problems:   Chest pain   Sinus bradycardia   Finding of multiple premature atrial contractions by electrocardiography   Right leg pain   History of sepsis secondary pneumonia (02/17/2024)   Underweight (BMI < 18.5)   History of substance abuse (HCC)   Chronic anemia   * MDD (major depressive disorder) Continued management per psychiatry   Chest pain - now resolved Suspecting non-cardiac and related to persistent coughing from recent pneumonia ---> D-dimer resulted negative so low suspicion for PE --> Troponin normal at 7 - continue to treat with dextromethorphan guaifenesin Pain control, multimodal-patient states he is intolerant to ibuprofen  and NSAIDs and does not think Tylenol  will help   Sinus bradycardia Multiple PACs on EKG PACs unchanged from EKG done on 02/15/2024 Prior notes review without prior bradycardia, was mostly tachycardic Heart rate in the high 30s to 60s without lightheadedness, possibly baseline -->TSH resulted normal Cardiology seen - no further w/up Might benefit from Zio patch at discharge   Right leg pain Pain appears chronic Right lower extremity venous  ultrasound from 02/16/2024 was negative for DVT or popliteal cyst Suspect musculoskeletal pain No evidence of cellulitis Pain control-patient states he cannot take ibuprofen  and does not think Tylenol  will help Multimodal pain control with as needed oxycodone , gabapentin  if needed and lidocaine  patch   History of sepsis secondary pneumonia (02/17/2024) Treated for pneumonia 10/14 to 02/17/2024 Has persistent coughing with phlegm production Mucolytic and antiexpectorant Last CTA chest on 02/15/24 with recommendation for follow up CT so can consider   Underweight (BMI < 18.5) Baseline unknown   Chronic anemia Stable    History of substance abuse (HCC) UDS on 10/21 positive for cannabinoids        Subjective:  Denies any chest pain, c/o low back pain (chronic), asking when can he go home?  Objective: Vitals:   02/24/24 0904 02/24/24 0905 02/24/24 0906 02/24/24 1647  BP:   (!) 151/87 122/76  Pulse: (!) 38 (!) 40 67 67  Resp:   18   Temp:   98 F (36.7 C)   TempSrc:   Oral   SpO2:   100% 100%  Weight:      Height:       No intake or output data in the 24 hours ending 02/24/24 2143 Filed Weights   02/23/24 1300  Weight: 51.7 kg    Examination:  General exam: Appears calm and comfortable  Respiratory system: Clear to auscultation. Respiratory effort normal. Cardiovascular system: S1 & S2 heard, RRR. No JVD, murmurs, rubs, gallops or clicks. No pedal edema. Gastrointestinal system: Abdomen is soft, benign Central nervous system: Alert and oriented. No focal neurological deficits. Extremities: Symmetric 5 x 5 power. Skin: No rashes, lesions or ulcers Psychiatry:  Judgement and insight appear normal. Mood & affect appropriate.     Data Reviewed: I have personally reviewed following labs and imaging studies  CBC: Recent Labs  Lab 02/22/24 2120 02/23/24 2109  WBC 14.7* 11.5*  NEUTROABS  --  6.7  HGB 10.4* 11.0*  HCT 33.6* 34.2*  MCV 97.1 95.3  PLT 365 393    Basic Metabolic Panel: Recent Labs  Lab 02/22/24 1850 02/23/24 2109  NA 143 142  K 3.6 3.8  CL 104 104  CO2 28 26  GLUCOSE 119* 98  BUN 11 18  CREATININE 0.79 0.98  CALCIUM 9.5 9.1   GFR: Estimated Creatinine Clearance: 63 mL/min (by C-G formula based on SCr of 0.98 mg/dL). Liver Function Tests: Recent Labs  Lab 02/22/24 1850 02/23/24 2109  AST 19 22  ALT 9 13  ALKPHOS 55 41  BILITOT 0.3 0.3  PROT 7.2 6.7  ALBUMIN 4.1 3.4*    Thyroid Function Tests: Recent Labs    02/23/24 2109  TSH 1.477   Anemia Panel: No results for input(s): VITAMINB12, FOLATE, FERRITIN, TIBC, IRON, RETICCTPCT in the last 72 hours. Sepsis Labs: Recent Labs  Lab 02/23/24 2109  PROCALCITON <0.10    Recent Results (from the past 240 hours)  Resp panel by RT-PCR (RSV, Flu A&B, Covid) Anterior Nasal Swab     Status: None   Collection Time: 02/15/24 11:40 PM   Specimen: Anterior Nasal Swab  Result Value Ref Range Status   SARS Coronavirus 2 by RT PCR NEGATIVE NEGATIVE Final   Influenza A by PCR NEGATIVE NEGATIVE Final   Influenza B by PCR NEGATIVE NEGATIVE Final    Comment: (NOTE) The Xpert Xpress SARS-CoV-2/FLU/RSV plus assay is intended as an aid in the diagnosis of influenza from Nasopharyngeal swab specimens and should not be used as a sole basis for treatment. Nasal washings and aspirates are unacceptable for Xpert Xpress SARS-CoV-2/FLU/RSV testing.  Fact Sheet for Patients: BloggerCourse.com  Fact Sheet for Healthcare Providers: SeriousBroker.it  This test is not yet approved or cleared by the [place]  FDA and has been authorized for detection and/or diagnosis of SARS-CoV-2 by FDA under an Emergency Use Authorization (EUA). This EUA will remain in effect (meaning this test can be used) for the duration of the COVID-19 declaration under Section 564(b)(1) of the Act, 21 U.S.C. section 360bbb-3(b)(1), unless  the authorization is terminated or revoked.     Resp Syncytial Virus by PCR NEGATIVE NEGATIVE Final    Comment: (NOTE) Fact Sheet for Patients: BloggerCourse.com  Fact Sheet for Healthcare Providers: SeriousBroker.it  This test is not yet approved or cleared by the [place]  FDA and has been authorized for detection and/or diagnosis of SARS-CoV-2 by FDA under an Emergency Use Authorization (EUA). This EUA will remain in effect (meaning this test can be used) for the duration of the COVID-19 declaration under Section 564(b)(1) of the Act, 21 U.S.C. section 360bbb-3(b)(1), unless the authorization is terminated or revoked.  Performed at [facility], [address], [place], [place] [address]   Culture, blood (routine x 2)     Status: None   Collection Time: 02/16/24  2:58 AM   Specimen: BLOOD  Result Value Ref Range Status   Specimen Description BLOOD SITE NOT SPECIFIED  Final   Special Requests   Final    BOTTLES DRAWN AEROBIC AND ANAEROBIC Blood Culture results may not be optimal due to an inadequate volume of blood received in culture bottles   Culture   Final  NO GROWTH 5 DAYS Performed at [facility], [address] [place], [place] [address]    Report Status 02/21/2024 FINAL  Final  Culture, blood (routine x 2)     Status: None   Collection Time: 02/16/24  3:34 AM   Specimen: BLOOD  Result Value Ref Range Status   Specimen Description BLOOD SITE NOT SPECIFIED  Final   Special Requests   Final    BOTTLES DRAWN AEROBIC AND ANAEROBIC Blood Culture results may not be optimal due to an inadequate volume of blood received in culture bottles   Culture   Final    NO GROWTH 5 DAYS Performed at [facility], [address] [place], [place] [address]    Report Status 02/21/2024 FINAL  Final         Radiology Studies: DG Chest 2 View Result Date: 02/22/2024 CLINICAL DATA:  Chest pain syncope  EXAM: CHEST - 2 VIEW COMPARISON:  02/16/2024 FINDINGS: The heart size and mediastinal contours are within normal limits. Both lungs are clear. The visualized skeletal structures are unremarkable. IMPRESSION: No active cardiopulmonary disease. Electronically Signed   By: Luke Bun M.D.   On: 02/22/2024 21:56        Scheduled Meds:  dextromethorphan-guaiFENesin  1 tablet Oral BID   DULoxetine   20 mg Oral BID   feeding supplement  237 mL Oral BID BM   nicotine   14 mg Transdermal Daily   Continuous Infusions:   LOS: 1 day    Time spent: 20 mins    Cresencio Fairly, MD [facility] Pager [phone]  If 7PM-7AM, please contact night-coverage [email or web address]  02/24/2024, 9:43 PM

## 2024-02-24 NOTE — [facility] Suicide Risk Assessment (Cosign Needed)
 [facility] Admission Suicide Risk Assessment   Nursing information obtained from:  Patient Demographic factors:  Male, Divorced or widowed, Low socioeconomic status, Living alone, Unemployed Current Mental Status:  Suicidal ideation indicated by patient, Self-harm thoughts, Belief that plan would result in death Loss Factors:  Financial problems / change in socioeconomic status, Decline in physical health, Loss of significant relationship, Decrease in vocational status Historical Factors:  Family history of mental illness or substance abuse Risk Reduction Factors:  Sense of responsibility to family, Religious beliefs about death, Positive coping skills or problem solving skills  Total Time spent with patient: 1 hour Principal Problem: MDD (major depressive disorder) Diagnosis:  Principal Problem:   MDD (major depressive disorder) Active Problems:   Right leg pain   Sinus bradycardia   Chest pain   Underweight (BMI < 18.5)   History of sepsis secondary pneumonia (02/17/2024)   Finding of multiple premature atrial contractions by electrocardiography   History of substance abuse (HCC)   Chronic anemia  Subjective Data:  The patient reports a history of recurrent depression and anxiety for multiple years, which has only increased in intensity over the past few years because of multiple family losses, as well as chronic financial issues.   Patient has history of polysubstance use, but states that has been sober x 12 years.  Does use cannabis somewhat regularly, but denies other drug usage.  Has had several admission for suicidal thoughts through the years, most recently in July of this year.  Was discharged on Cymbalta  and naltrexone , but states that could not afford meds, and did not get follow-up treatment.  In the past, has made plans to overdose on medication, although to date has sought help before taking any.  Has chronic pain issues, and in past has said that he feels distressed that he cannot  get opiate medications for pain relief.   Patient states that his depression has been worsening over past few weeks.  Lives by himself, with only minimal support.  By this morning, was hearing voices telling him that he should kill himself, so sought evaluation for safety and treatment.  No homicidal ideation.   The patient was admitted to [facility] inpatient behavioral health unit for further monitoring and stabilization.  Today when asked about suicidal thoughts, patient quoted I do not know, I have a lot of physical pain and worrying going on as well as congestion.  When asked to further elaborate patient denied active suicidal thoughts, but reported feeling overwhelmed currently.  Patient did require rapid response last night due to chest pain and elevated blood pressure and bradycardia.  Consult note from hospitalist as well as cardiology available in chart review.  Patient was evaluated by hospital MD team as well as cardiology.  The following recommendations were given by cardiology team   ASSESSMENT AND PLAN:  [name] is a 53 y.o. male  with a past medical history of major depressive disorder, anemia, polysubstance abuse who presented to the ED on 02/23/2024 for suicidal ideation. Recently hospitalized from 10/14-10/16 for sepsis, multifocal pneumonia. Overnight on 02/23/24 he reported chest pain. Cardiology was consulted for further evaluation.    # Atypical chest pain # Costochondritis # Recent pneumonia # Major depressive disorder with suicidal ideation Patient initially admitted with suicidal ideation, treated for pneumonia last week and overnight complained of chest discomfort.  Describes this as worse with palpation and certain positional movements.  Troponins normal x 2.  EKG normal sinus rhythm with PACs, no evidence of bradycardia  or other acute ischemic changes. - No further workup recommended from cardiac perspective. - He is asymptomatic with PACs, thus would not recommend  any medication management for this.   Cardiology will sign off. Please haiku with questions or re-engage if needed.    This patient's plan of care was discussed and created with Dr. Wilburn and he is in agreement.   Patient currently denies homicidal ideations.  He denies auditory or visual hallucinations as well.  He did report ongoing financial hardships which have caused stress and feelings of being down and depressed.  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  He reported his license were taken away initially when he was around 53 years old due to charges related with drugs.  He denied any current pending legal charges.  He also reported issues with being able to afford food, he reported that his food stamps were recently taken away from him.  He did report he currently lives in section 8 housing.  Patient was calm and cooperative with this provider throughout assessment.  Continued Clinical Symptoms:  Alcohol  Use Disorder Identification Test Final Score (AUDIT): 0 The Alcohol  Use Disorders Identification Test, Guidelines for Use in Primary Care, Second Edition.  World Science writer [facility] Score between 0-7:  no or low risk or alcohol  related problems. Score between 8-15:  moderate risk of alcohol  related problems. Score between 16-19:  high risk of alcohol  related problems. Score 20 or above:  warrants further diagnostic evaluation for alcohol  dependence and treatment.   CLINICAL FACTORS:   Depression:   Hopelessness Alcohol /Substance Abuse/Dependencies   Musculoskeletal: Strength & Muscle Tone: within normal limits Gait & Station: normal Patient leans: N/A  Psychiatric Specialty Exam:  Presentation  General Appearance:  Appropriate for Environment  Eye Contact: Good  Speech: Clear and Coherent  Speech Volume: Normal  Handedness: -- (not assessed)   Mood  and Affect  Mood: Anxious  Affect: Appropriate   Thought Process  Thought Processes: Coherent; Linear  Descriptions of Associations:Intact  Orientation:Full (Time, Place and Person)  Thought Content:WDL  History of Schizophrenia/Schizoaffective disorder:No  Duration of Psychotic Symptoms:N/A  Hallucinations:Hallucinations: None  Ideas of Reference:None  Suicidal Thoughts:Suicidal Thoughts: No  Homicidal Thoughts:Homicidal Thoughts: No   Sensorium  Memory: Immediate Fair; Recent Fair; Remote Fair  Judgment: Fair  Insight: Fair   Art therapist  Concentration: Fair  Attention Span: Fair  Recall: [employer] of Knowledge: Fair  Language: Fair   Psychomotor Activity  Psychomotor Activity:Psychomotor Activity: Normal   Assets  Assets: Manufacturing systems engineer; Desire for Improvement   Sleep  Sleep:Sleep: Fair    Physical Exam: Physical Exam Neurological:     Mental Status: He is alert and oriented to person, place, and time.    Review of Systems  Constitutional:  Negative for fever.  Respiratory:  Positive for cough.   Gastrointestinal:  Negative for abdominal pain, nausea and vomiting.  Neurological:  Negative for dizziness and headaches.  Psychiatric/Behavioral:  Positive for depression. Negative for hallucinations, substance abuse and suicidal ideas. The patient is nervous/anxious.    Blood pressure (!) 151/87, pulse 67, temperature 98 F (36.7 C), temperature source Oral, resp. rate 18, height 5' 7 (1.702 m), weight 51.7 kg, SpO2 100%. Body mass index is 17.85 kg/m.   COGNITIVE FEATURES THAT CONTRIBUTE TO RISK:  None    SUICIDE RISK:   Moderate:  Frequent  suicidal ideation with limited intensity, and duration, some specificity in terms of plans, no associated intent, good self-control, limited dysphoria/symptomatology, some risk factors present, and identifiable protective factors, including available and accessible social  support.  PLAN OF CARE: Admit to inpatient psychiatric unit for further monitoring and stabilization  I certify that inpatient services furnished can reasonably be expected to improve the patient's condition.   Zelda Sharps, NP

## 2024-02-25 DIAGNOSIS — Z8701 Personal history of pneumonia (recurrent): Secondary | ICD-10-CM

## 2024-02-25 DIAGNOSIS — M79604 Pain in right leg: Secondary | ICD-10-CM

## 2024-02-25 DIAGNOSIS — D649 Anemia, unspecified: Secondary | ICD-10-CM

## 2024-02-25 DIAGNOSIS — Z8619 Personal history of other infectious and parasitic diseases: Secondary | ICD-10-CM

## 2024-02-25 DIAGNOSIS — I38 Endocarditis, valve unspecified: Secondary | ICD-10-CM

## 2024-02-25 DIAGNOSIS — Z8659 Personal history of other mental and behavioral disorders: Secondary | ICD-10-CM

## 2024-02-25 DIAGNOSIS — Z681 Body mass index (BMI) 19 or less, adult: Secondary | ICD-10-CM

## 2024-02-25 DIAGNOSIS — R001 Bradycardia, unspecified: Secondary | ICD-10-CM

## 2024-02-25 DIAGNOSIS — R636 Underweight: Secondary | ICD-10-CM

## 2024-02-25 DIAGNOSIS — F331 Major depressive disorder, recurrent, moderate: Principal | ICD-10-CM

## 2024-02-25 DIAGNOSIS — R079 Chest pain, unspecified: Secondary | ICD-10-CM

## 2024-02-25 MED ORDER — ADULT MULTIVITAMIN W/MINERALS CH
1.0000 | ORAL_TABLET | Freq: Every day | ORAL | Status: DC
  Administered 2024-02-25 – 2024-02-28 (×4): 1 via ORAL
  Filled 2024-02-25 (×4): qty 1

## 2024-02-25 NOTE — Progress Notes (Signed)
 PROGRESS NOTE    [name]  FMW:[id number] DOB: [birth year] DOA: 02/23/2024 PCP: Patient, No Pcp Per  Brief Narrative:   53 y.o. male, admitted to the behavioral health unit with suicidal ideation being seen in urgent consultation for chest pain and narcs as an overhead rapid response.  Patient, has a past medical history is significant for depression, anemia, polysubstance abuse, recently hospitalized from 10/14 to 02/17/2024 with sepsis secondary to pneumonia after presenting with cough, chest pain, cold sweats and right leg pain,  and with chest CTA showing multifocal pneumonia, without evidence of PE, subsequently rehospitalized to [facility] on 02/22/2024 with suicidal ideation.   10/23: chest pain resolved    Assessment & Plan:   Principal Problem:   MDD (major depressive disorder) Active Problems:   Chest pain   Sinus bradycardia   Finding of multiple premature atrial contractions by electrocardiography   Right leg pain   History of sepsis secondary pneumonia (02/17/2024)   Underweight (BMI < 18.5)   History of substance abuse (HCC)   Chronic anemia   MDD (major depressive disorder), recurrent episode, moderate (HCC)   * MDD (major depressive disorder) Continued management per psychiatry   Chest pain - now resolved Suspecting non-cardiac and related to persistent coughing from recent pneumonia ---> D-dimer resulted negative so low suspicion for PE --> Troponin normal at 7 - continue to treat with dextromethorphan guaifenesin Pain control, multimodal-patient states he is intolerant to ibuprofen  and NSAIDs and does not think Tylenol  will help   Sinus bradycardia Multiple PACs on EKG PACs unchanged from EKG done on 02/15/2024 Prior notes review without prior bradycardia, was mostly tachycardic Heart rate improved -->TSH resulted normal Cardiology seen - no further w/up Might benefit from Zio patch at discharge   Right leg pain Pain appears chronic Right lower  extremity venous ultrasound from 02/16/2024 was negative for DVT or popliteal cyst Suspect musculoskeletal pain No evidence of cellulitis Pain control-patient states he cannot take ibuprofen  and does not think Tylenol  will help Multimodal pain control with as needed oxycodone , gabapentin  if needed and lidocaine  patch   History of sepsis secondary pneumonia (02/17/2024) Treated for pneumonia 10/14 to 02/17/2024 Has persistent coughing with phlegm production Mucolytic and antiexpectorant Last CTA chest on 02/15/24 with recommendation for follow up CT so can consider   Underweight (BMI < 18.5) Baseline unknown   Chronic anemia Stable    History of substance abuse (HCC) UDS on 10/21 positive for cannabinoids        Subjective:  Denies any chest pain, c/o low back pain (chronic), no new issues  Objective: Vitals:   02/24/24 0906 02/24/24 1647 02/25/24 0603 02/25/24 1615  BP: (!) 151/87 122/76 129/77 133/80  Pulse: 67 67 76 65  Resp: 18  18   Temp: 98 F (36.7 C)  98.4 F (36.9 C)   TempSrc: Oral  Oral   SpO2: 100% 100% 99% 100%  Weight:      Height:       No intake or output data in the 24 hours ending 02/25/24 1935 Filed Weights   02/23/24 1300  Weight: 51.7 kg    Examination:  General exam: Appears calm and comfortable  Respiratory system: Clear to auscultation. Respiratory effort normal. Cardiovascular system: S1 & S2 heard, RRR. No JVD, murmurs, rubs, gallops or clicks. No pedal edema. Gastrointestinal system: Abdomen is soft, benign Central nervous system: Alert and oriented. No focal neurological deficits. Extremities: Symmetric 5 x 5 power. Skin: No rashes, lesions or ulcers Psychiatry:  Judgement and insight appear normal. Mood & affect appropriate.     Data Reviewed: I have personally reviewed following labs and imaging studies  CBC: Recent Labs  Lab 02/22/24 2120 02/23/24 2109  WBC 14.7* 11.5*  NEUTROABS  --  6.7  HGB 10.4* 11.0*  HCT 33.6*  34.2*  MCV 97.1 95.3  PLT 365 393   Basic Metabolic Panel: Recent Labs  Lab 02/22/24 1850 02/23/24 2109  NA 143 142  K 3.6 3.8  CL 104 104  CO2 28 26  GLUCOSE 119* 98  BUN 11 18  CREATININE 0.79 0.98  CALCIUM 9.5 9.1   GFR: Estimated Creatinine Clearance: 63 mL/min (by C-G formula based on SCr of 0.98 mg/dL). Liver Function Tests: Recent Labs  Lab 02/22/24 1850 02/23/24 2109  AST 19 22  ALT 9 13  ALKPHOS 55 41  BILITOT 0.3 0.3  PROT 7.2 6.7  ALBUMIN 4.1 3.4*    Thyroid Function Tests: Recent Labs    02/23/24 2109  TSH 1.477   Anemia Panel: No results for input(s): VITAMINB12, FOLATE, FERRITIN, TIBC, IRON, RETICCTPCT in the last 72 hours. Sepsis Labs: Recent Labs  Lab 02/23/24 2109  PROCALCITON <0.10    Recent Results (from the past 240 hours)  Resp panel by RT-PCR (RSV, Flu A&B, Covid) Anterior Nasal Swab     Status: None   Collection Time: 02/15/24 11:40 PM   Specimen: Anterior Nasal Swab  Result Value Ref Range Status   SARS Coronavirus 2 by RT PCR NEGATIVE NEGATIVE Final   Influenza A by PCR NEGATIVE NEGATIVE Final   Influenza B by PCR NEGATIVE NEGATIVE Final    Comment: (NOTE) The Xpert Xpress SARS-CoV-2/FLU/RSV plus assay is intended as an aid in the diagnosis of influenza from Nasopharyngeal swab specimens and should not be used as a sole basis for treatment. Nasal washings and aspirates are unacceptable for Xpert Xpress SARS-CoV-2/FLU/RSV testing.  Fact Sheet for Patients: BloggerCourse.com  Fact Sheet for Healthcare Providers: SeriousBroker.it  This test is not yet approved or cleared by the [place]  FDA and has been authorized for detection and/or diagnosis of SARS-CoV-2 by FDA under an Emergency Use Authorization (EUA). This EUA will remain in effect (meaning this test can be used) for the duration of the COVID-19 declaration under Section 564(b)(1) of the Act, 21  U.S.C. section 360bbb-3(b)(1), unless the authorization is terminated or revoked.     Resp Syncytial Virus by PCR NEGATIVE NEGATIVE Final    Comment: (NOTE) Fact Sheet for Patients: BloggerCourse.com  Fact Sheet for Healthcare Providers: SeriousBroker.it  This test is not yet approved or cleared by the [place]  FDA and has been authorized for detection and/or diagnosis of SARS-CoV-2 by FDA under an Emergency Use Authorization (EUA). This EUA will remain in effect (meaning this test can be used) for the duration of the COVID-19 declaration under Section 564(b)(1) of the Act, 21 U.S.C. section 360bbb-3(b)(1), unless the authorization is terminated or revoked.  Performed at [facility], [address] [place], [place] [address]   Culture, blood (routine x 2)     Status: None   Collection Time: 02/16/24  2:58 AM   Specimen: BLOOD  Result Value Ref Range Status   Specimen Description BLOOD SITE NOT SPECIFIED  Final   Special Requests   Final    BOTTLES DRAWN AEROBIC AND ANAEROBIC Blood Culture results may not be optimal due to an inadequate volume of blood received in culture bottles   Culture   Final  NO GROWTH 5 DAYS Performed at [facility], [address] [place], [place] [address]    Report Status 02/21/2024 FINAL  Final  Culture, blood (routine x 2)     Status: None   Collection Time: 02/16/24  3:34 AM   Specimen: BLOOD  Result Value Ref Range Status   Specimen Description BLOOD SITE NOT SPECIFIED  Final   Special Requests   Final    BOTTLES DRAWN AEROBIC AND ANAEROBIC Blood Culture results may not be optimal due to an inadequate volume of blood received in culture bottles   Culture   Final    NO GROWTH 5 DAYS Performed at [facility], [address], [place], [place] [address]    Report Status 02/21/2024 FINAL  Final         Radiology Studies: No results  found.       Scheduled Meds:  dextromethorphan-guaiFENesin  1 tablet Oral BID   DULoxetine   20 mg Oral BID   feeding supplement  237 mL Oral BID BM   multivitamin with minerals  1 tablet Oral Daily   nicotine   14 mg Transdermal Daily   Continuous Infusions:   LOS: 2 days   D/w Dr Jadapalle. Will sign off for now. Please reach out via secure text if any questions  Time spent: 20 mins    Shishir Krantz Maree, MD [facility] Pager [phone]  If 7PM-7AM, please contact night-coverage [email or web address]  02/25/2024, 7:35 PM

## 2024-02-25 NOTE — Plan of Care (Signed)
   Problem: Safety: Goal: Ability to remain free from injury will improve Outcome: Progressing

## 2024-02-25 NOTE — BH IP Treatment Plan (Signed)
 Interdisciplinary Treatment and Diagnostic Plan Update  02/25/2024 Time of Session: 10:41 AM [name] MRN: [id number]  Principal Diagnosis: MDD (major depressive disorder)  Secondary Diagnoses: Principal Problem:   MDD (major depressive disorder) Active Problems:   Right leg pain   Sinus bradycardia   Chest pain   Underweight (BMI < 18.5)   History of sepsis secondary pneumonia (02/17/2024)   Finding of multiple premature atrial contractions by electrocardiography   History of substance abuse (HCC)   Chronic anemia   Current Medications:  Current Facility-Administered Medications  Medication Dose Route Frequency Provider Last Rate Last Admin   alum & mag hydroxide-simeth (MAALOX/MYLANTA) 200-200-20 MG/5ML suspension 30 mL  30 mL Oral Q4H PRN Nkwenti, Doris, NP       dextromethorphan-guaiFENesin (MUCINEX DM) 30-600 MG per 12 hr tablet 1 tablet  1 tablet Oral BID Duncan, Hazel V, MD   1 tablet at 02/25/24 9161   haloperidol  (HALDOL ) tablet 5 mg  5 mg Oral TID PRN Tex Drilling, NP       And   diphenhydrAMINE  (BENADRYL ) capsule 50 mg  50 mg Oral TID PRN Tex Drilling, NP       haloperidol  lactate (HALDOL ) injection 5 mg  5 mg Intramuscular TID PRN Tex Drilling, NP       And   diphenhydrAMINE  (BENADRYL ) injection 50 mg  50 mg Intramuscular TID PRN Tex Drilling, NP       And   LORazepam  (ATIVAN ) injection 2 mg  2 mg Intramuscular TID PRN Tex Drilling, NP       haloperidol  lactate (HALDOL ) injection 10 mg  10 mg Intramuscular TID PRN Tex Drilling, NP       And   diphenhydrAMINE  (BENADRYL ) injection 50 mg  50 mg Intramuscular TID PRN Tex Drilling, NP       And   LORazepam  (ATIVAN ) injection 2 mg  2 mg Intramuscular TID PRN Tex Drilling, NP       DULoxetine  (CYMBALTA ) DR capsule 20 mg  20 mg Oral BID Smith, Annie B, NP   20 mg at 02/25/24 9161   feeding supplement (ENSURE PLUS HIGH PROTEIN) liquid 237 mL  237 mL Oral BID BM Jadapalle, Sree, MD   237 mL at  02/25/24 1101   hydrOXYzine  (ATARAX ) tablet 25 mg  25 mg Oral TID PRN Tex Drilling, NP       magnesium  hydroxide (MILK OF MAGNESIA) suspension 30 mL  30 mL Oral Daily PRN Tex Drilling, NP       multivitamin with minerals tablet 1 tablet  1 tablet Oral Daily Jadapalle, Sree, MD   1 tablet at 02/25/24 9155   nicotine  (NICODERM CQ  - dosed in mg/24 hours) patch 14 mg  14 mg Transdermal Daily Jadapalle, Sree, MD   14 mg at 02/25/24 9160   oxyCODONE  (Oxy IR/ROXICODONE ) immediate release tablet 5 mg  5 mg Oral Q4H PRN Duncan, Hazel V, MD       traZODone  (DESYREL ) tablet 50 mg  50 mg Oral QHS PRN Tex Drilling, NP   50 mg at 02/24/24 2237   PTA Medications: Medications Prior to Admission  Medication Sig Dispense Refill Last Dose/Taking   nicotine  polacrilex (NICORETTE ) 2 MG gum Take 1 each (2 mg total) by mouth as needed for smoking cessation. 100 tablet 0    ondansetron  (ZOFRAN ) 4 MG tablet Take 1 tablet (4 mg total) by mouth every 6 (six) hours as needed for nausea. 20 tablet 0     Patient Stressors: Surveyor, quantity  difficulties   Health problems   Medication change or noncompliance   Occupational concerns    Patient Strengths: Active sense of humor  Average or above average intelligence  Capable of independent living  Motivation for treatment/growth  Religious Affiliation   Treatment Modalities: Medication Management, Group therapy, Case management,  1 to 1 session with clinician, Psychoeducation, Recreational therapy.   Physician Treatment Plan for Primary Diagnosis: MDD (major depressive disorder) Long Term Goal(s): Improvement in symptoms so as ready for discharge   Short Term Goals: Ability to identify changes in lifestyle to reduce recurrence of condition will improve Ability to verbalize feelings will improve Ability to disclose and discuss suicidal ideas Ability to demonstrate self-control will improve Ability to identify and develop effective coping behaviors will  improve Ability to maintain clinical measurements within normal limits will improve  Medication Management: Evaluate patient's response, side effects, and tolerance of medication regimen.  Therapeutic Interventions: 1 to 1 sessions, Unit Group sessions and Medication administration.  Evaluation of Outcomes: Not Met  Physician Treatment Plan for Secondary Diagnosis: Principal Problem:   MDD (major depressive disorder) Active Problems:   Right leg pain   Sinus bradycardia   Chest pain   Underweight (BMI < 18.5)   History of sepsis secondary pneumonia (02/17/2024)   Finding of multiple premature atrial contractions by electrocardiography   History of substance abuse (HCC)   Chronic anemia  Long Term Goal(s): Improvement in symptoms so as ready for discharge   Short Term Goals: Ability to identify changes in lifestyle to reduce recurrence of condition will improve Ability to verbalize feelings will improve Ability to disclose and discuss suicidal ideas Ability to demonstrate self-control will improve Ability to identify and develop effective coping behaviors will improve Ability to maintain clinical measurements within normal limits will improve     Medication Management: Evaluate patient's response, side effects, and tolerance of medication regimen.  Therapeutic Interventions: 1 to 1 sessions, Unit Group sessions and Medication administration.  Evaluation of Outcomes: Not Met   RN Treatment Plan for Primary Diagnosis: MDD (major depressive disorder) Long Term Goal(s): Knowledge of disease and therapeutic regimen to maintain health will improve  Short Term Goals: Ability to verbalize frustration and anger appropriately will improve, Ability to demonstrate self-control, Ability to participate in decision making will improve, Ability to verbalize feelings will improve, Ability to disclose and discuss suicidal ideas, and Ability to identify and develop effective coping behaviors will  improve  Medication Management: RN will administer medications as ordered by provider, will assess and evaluate patient's response and provide education to patient for prescribed medication. RN will report any adverse and/or side effects to prescribing provider.  Therapeutic Interventions: 1 on 1 counseling sessions, Psychoeducation, Medication administration, Evaluate responses to treatment, Monitor vital signs and CBGs as ordered, Perform/monitor CIWA, COWS, AIMS and Fall Risk screenings as ordered, Perform wound care treatments as ordered.  Evaluation of Outcomes: Not Met   LCSW Treatment Plan for Primary Diagnosis: MDD (major depressive disorder) Long Term Goal(s): Safe transition to appropriate next level of care at discharge, Engage patient in therapeutic group addressing interpersonal concerns.  Short Term Goals: Engage patient in aftercare planning with referrals and resources, Increase social support, Increase ability to appropriately verbalize feelings, Increase emotional regulation, Facilitate acceptance of mental health diagnosis and concerns, Facilitate patient progression through stages of change regarding substance use diagnoses and concerns, Identify triggers associated with mental health/substance abuse issues, and Increase skills for wellness and recovery  Therapeutic Interventions: Assess for all  discharge needs, 1 to 1 time with Child psychotherapist, Explore available resources and support systems, Assess for adequacy in community support network, Educate family and significant other(s) on suicide prevention, Complete Psychosocial Assessment, Interpersonal group therapy.  Evaluation of Outcomes: Not Met   Progress in Treatment: Attending groups: Yes. and No. Participating in groups: Yes. and No. Taking medication as prescribed: Yes. Toleration medication: Yes. Family/Significant other contact made: No, will contact:  Watson Mower, [phone], Daughter Patient understands  diagnosis: Yes. Discussing patient identified problems/goals with staff: Yes. Medical problems stabilized or resolved: Yes. Denies suicidal/homicidal ideation: Yes. Issues/concerns per patient self-inventory: No. Other: None  New problem(s) identified: No, Describe:  None  New Short Term/Long Term Goal(s):detox, elimination of symptoms of psychosis, medication management for mood stabilization; elimination of SI thoughts; development of comprehensive mental wellness/sobriety plan.    Patient Goals:  To go home.  Discharge Plan or Barriers: CSW to assist with the development of appropriate discharge plan.    Reason for Continuation of Hospitalization: Anxiety Depression Suicidal ideation  Estimated Length of Stay: 1-7 days.   Last 3 [place] Suicide Severity Risk Score: Flowsheet Row Admission (Current) from 02/23/2024 in [facility]PATIENT BEH[facility]INE ED from 02/22/2024 in [facility] Emergency Department at [facility] ED from 02/12/2024 in [facility] Emergency Department at [facility]  C-SSRS RISK CATEGORY Moderate Risk High Risk No Risk    Last [facility] 2/9 Scores:    04/13/2023    2:25 PM 10/14/2021    8:22 AM 09/29/2021    4:03 AM  Depression screen PHQ 2/9  Decreased Interest 1 1 2   Down, Depressed, Hopeless 3 3 3   PHQ - 2 Score 4 4 5   Altered sleeping 0 2 2  Tired, decreased energy 0 2 2  Change in appetite 0 1 2  Feeling bad or failure about yourself  3 3 3   Trouble concentrating 1 1 2   Moving slowly or fidgety/restless 1 0 1  Suicidal thoughts 2 1 2   PHQ-9 Score 11 14 19   Difficult doing work/chores Very difficult Somewhat difficult Very difficult    Scribe for Treatment Team: Alveta CHRISTELLA Kerns, LCSW 02/25/2024 11:23 AM

## 2024-02-25 NOTE — Progress Notes (Signed)
   02/25/24 1100  Spiritual Encounters  Type of Visit Initial  Care provided to: Patient  Conversation partners present during encounter Nurse  Referral source Other (comment) ([facility])  Reason for visit Routine spiritual support  OnCall Visit No   Chaplain visited patient per [facility] Consult in the [facility] system.  Patient shared unresolved issues with grief having lost several important family members in the last 6 years.  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  Patient said he's trying really hard but doesn't seem to be making any progress managing the responsibilities he has.  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  Patient named familial issues and expressed his difficulty in asking for help in general.  Patient said he has strong faith and knows the Bible but is not comfortable asking religious institutions for support.  In addition, patient has substantial back trouble that may make some tasks difficult regarding work.  Patient shared he's overwhelmed and checked himself into [facility] for SI.  Chaplain offered a compassionate presence and reflective listening.  Chaplain offered some suggestions for resources and validated the patient's feelings.  Chaplain offered to share some of the patient's concerns with staff, which she did.  Patient also mentioned that he would rather speak to staff regarding his situation in private and not in a large space where others can hear - for his privacy.  Patient wanted prayer so Chaplain prayed with patient.   Rev. Rana M. Nicholaus, M.Div. Chaplain Resident [facility]

## 2024-02-25 NOTE — Group Note (Signed)
 Recreation Therapy Group Note   Group Topic:Leisure Education  Group Date: 02/25/2024 Start Time: 1100 End Time: 1150 Facilitators: Celestia Jeoffrey BRAVO, LRT, CTRS Location: Craft Room  Group Description: Leisure. Patients were given the option to choose from journaling, coloring, drawing, making origami, playing with playdoh, listening to music or singing karaoke. LRT and pts discussed the meaning of leisure, the importance of participating in leisure during their free time/when they're outside of the hospital, as well as how our leisure interests can also serve as coping skills.   Goal Area(s) Addressed:  Patient will identify a current leisure interest.  Patient will learn the definition of "leisure". Patient will practice making a positive decision. Patient will have the opportunity to try a new leisure activity. Patient will communicate with peers and LRT.   Affect/Mood: N/A   Participation Level: Did not attend    Clinical Observations/Individualized Feedback: Patient did not attend group.   Plan: Continue to engage patient in RT group sessions 2-3x/week.   Jeoffrey BRAVO Celestia, LRT, CTRS 02/25/2024 1:09 PM

## 2024-02-25 NOTE — Plan of Care (Signed)
  Problem: Health Behavior/Discharge Planning: Goal: Ability to manage health-related needs will improve Outcome: Progressing   Problem: Clinical Measurements: Goal: Ability to maintain clinical measurements within normal limits will improve Outcome: Progressing   Problem: Clinical Measurements: Goal: Respiratory complications will improve Outcome: Progressing   Problem: Clinical Measurements: Goal: Cardiovascular complication will be avoided Outcome: Progressing   Problem: Nutrition: Goal: Adequate nutrition will be maintained Outcome: Progressing   Problem: Safety: Goal: Ability to remain free from injury will improve Outcome: Progressing   Problem: Pain Managment: Goal: General experience of comfort will improve and/or be controlled Outcome: Progressing

## 2024-02-25 NOTE — Group Note (Signed)
 Date:  02/25/2024 Time:  5:47 PM  Group Topic/Focus:  Wellness Toolbox:   The focus of this group is to discuss various aspects of wellness, balancing those aspects and exploring ways to increase the ability to experience wellness.  Patients will create a wellness toolbox for use upon discharge.    Participation Level:  Active  Participation Quality:  Appropriate  Affect:  Appropriate  Cognitive:  Appropriate  Insight: Appropriate  Engagement in Group:  Engaged  Modes of Intervention:  Activity and Socialization  Additional Comments:    [name] 02/25/2024, 5:47 PM

## 2024-02-25 NOTE — Progress Notes (Signed)
   02/25/24 1700  Psych Admission Type (Psych Patients Only)  Admission Status Voluntary  Psychosocial Assessment  Patient Complaints Malaise  Eye Contact Fair  Facial Expression Other (Comment) (WL)  Affect Appropriate to circumstance  Speech Logical/coherent  Interaction Assertive  Motor Activity Slow  Appearance/Hygiene Improved  Behavior Characteristics Cooperative;Appropriate to situation  Mood Pleasant  Thought Process  Coherency WDL  Content WDL  Delusions None reported or observed  Perception WDL  Hallucination None reported or observed  Judgment Poor  Confusion None  Danger to Self  Current suicidal ideation? Denies  Self-Injurious Behavior No self-injurious ideation or behavior indicators observed or expressed   Agreement Not to Harm Self Yes  Description of Agreement Verbal  Danger to Others  Danger to Others None reported or observed

## 2024-02-25 NOTE — Progress Notes (Signed)
 NUTRITION ASSESSMENT  Pt identified as at risk on the Malnutrition Screen Tool  INTERVENTION:  -Continue regular diet -MVI with minerals daily -Continue Ensure Plus High Protein po BID, each supplement provides 350 kcal and 20 grams of protein   NUTRITION DIAGNOSIS: Unintentional weight loss related to sub-optimal intake as evidenced by pt report.   Goal: Pt to meet >/= 90% of their estimated nutrition needs.  Monitor:  PO intake  Assessment:  Admitted to the behavioral health unit with suicidal ideation being seen in urgent consultation for chest pain and narcs as an overhead rapid response. Patient, has a past medical history is significant for depression, anemia, polysubstance abuse, recently hospitalized from 10/14 to 02/17/2024 with sepsis secondary to pneumonia after presenting with cough, chest pain, cold sweats and right leg pain, and with chest CTA showing multifocal pneumonia, without evidence of PE, subsequently rehospitalized to [facility] on 02/22/2024 with suicidal ideation.   Patient admitted with MDD and suicidal ideation.   Per H&P, he has history of polysubstance abuse, but endorses to 12 years of sobriety. He uses cannabis regularly.   He is currently on a regular diet. No meal completion data available to assess at this time.   Reviewed weight history; he has experienced a 1% weight loss over the past 3 months, which is not significant for time frame.   Medications reviewed.   Labs reviewed. Tox screen positive for tetrahydrocannabinol.   53 y.o. male  Height: Ht Readings from Last 1 Encounters:  02/23/24 5' 7 (1.702 m)    Weight: Wt Readings from Last 1 Encounters:  02/23/24 51.7 kg    Weight Hx: Wt Readings from Last 10 Encounters:  02/23/24 51.7 kg  02/22/24 64 kg  02/15/24 63.5 kg  02/12/24 63.5 kg  11/23/23 52.2 kg  11/23/23 52.2 kg  06/30/23 54 kg  06/29/23 62.1 kg  04/13/23 62.1 kg  03/23/23 53 kg    BMI:  Body mass index is 17.85  kg/m. Pt meets criteria for underweight based on current BMI.  Estimated Nutritional Needs: Kcal: 25-30 kcal/kg Protein: > 1 gram protein/kg Fluid: 1 ml/kcal  Diet Order:  Diet Order             Diet regular Room service appropriate? Yes; Fluid consistency: Thin  Diet effective now                  Pt is also offered choice of unit snacks mid-morning and mid-afternoon.  Pt is eating as desired.   Lab results and medications reviewed.   Margery ORN, RD, LDN, CDCES Registered Dietitian III Certified Diabetes Care and Education Specialist If unable to reach this RD, please use RD Inpatient group chat on secure chat between hours of 8am-4 pm daily

## 2024-02-25 NOTE — Group Note (Signed)
 Recreation Therapy Group Note   Group Topic:Health and Wellness  Group Date: 02/25/2024 Start Time: 1510 End Time: 1600 Facilitators: Celestia Jeoffrey BRAVO, LRT, CTRS Location: Courtyard  Group Description: [employer]. LRT and patients played games of basketball, drew with chalk, and played corn hole while outside in the courtyard while getting fresh air and sunlight. Music was being played in the background. LRT and peers conversed about different games they have played before, what they do in their free time and anything else that is on their minds. LRT encouraged pts to drink water after being outside, sweating and getting their heart rate up.  Goal Area(s) Addressed: Patient will build on frustration tolerance skills. Patients will partake in a competitive play game with peers. Patients will gain knowledge of new leisure interest/hobby.    Affect/Mood: Appropriate   Participation Level: Active   Participation Quality: Independent   Behavior: Appropriate   Speech/Thought Process: Coherent   Insight: Fair   Judgement: Fair    Modes of Intervention: Activity   Patient Response to Interventions:  Receptive   Education Outcome:  Acknowledges education   Clinical Observations/Individualized Feedback: [place] was active in their participation of session activities and group discussion. Pt interacted well with LRT and peers duration of session.    Plan: Continue to engage patient in RT group sessions 2-3x/week.   Jeoffrey BRAVO Celestia, LRT, CTRS 02/25/2024 4:51 PM

## 2024-02-25 NOTE — [facility] Suicide Risk Assessment (Signed)
 [facility] INPATIENT:  Family/Significant Other Suicide Prevention Education  Suicide Prevention Education:  Contact Attempts: Zakiah Schafer/daughter [phone]), has been identified by the patient as the family member/significant other with whom the patient will be residing, and identified as the person(s) who will aid the patient in the event of a mental health crisis.  With written consent from the patient, two attempts were made to provide suicide prevention education, prior to and/or following the patient's discharge.  We were unsuccessful in providing suicide prevention education.  A suicide education pamphlet was given to the patient to share with family/significant other.  Date and time of first attempt: 02/25/24 at 4:04PM Date and time of second attempt: Second attempt needed.   Contact was unable to be established but HIPAA compliant voicemail left with contact information for follow through.   Nadara JONELLE Fam 02/25/2024, 4:07 PM

## 2024-02-25 NOTE — Progress Notes (Signed)
 [facility] MD Progress Note  02/25/2024 12:23 PM Doryan Bahl  MRN:  [id number]  Burnie Therien is a 53 y.o. year-old male with a history of recurrent depression and anxiety for multiple years, which has only increased in intensity over the past few years because of multiple family losses, as well as chronic financial issues. He presented to the ED with SI with no plan. Patient is admitted to adult psych unit with Q15 min safety monitoring. Multidisciplinary team approach is offered. Medication management; group/milieu therapy is offered.  Subjective:  Chart reviewed, case discussed in multidisciplinary meeting, patient seen during rounds.   Patient is noted to be resting in bed and reading a book.  He reports having chronic depression and passive SI in the context of multiple psychosocial stressors including financial problems, not having food stamps, unable to work etc.  Patient denies active SI/HI/intent/plan today.  He denies auditory/visual hallucinations.  He reports that he struggles with chronic pain and does not have any primary care doctor to help him with pain management.  Provider assured him of getting a referral to PCP.  Provider encouraged him to take his medications and engaged on the unit activity. Sleep: Fair  Appetite:  Fair  Past Psychiatric History: see h&P Family History:  Family History  Problem Relation Age of Onset   Hypertension Mother    Hypertension Father    Hypertension Other    Social History:  Social History   Substance and Sexual Activity  Alcohol  Use No     Social History   Substance and Sexual Activity  Drug Use Yes   Types: Marijuana    Social History   Socioeconomic History   Marital status: Single    Spouse name: Not on file   Number of children: Not on file   Years of education: Not on file   Highest education level: Not on file  Occupational History   Not on file  Tobacco Use   Smoking status: Every Day    Types: Cigarettes    Smokeless tobacco: Never  Vaping Use   Vaping status: Never Used  Substance and Sexual Activity   Alcohol  use: No   Drug use: Yes    Types: Marijuana   Sexual activity: Yes    Birth control/protection: Condom  Other Topics Concern   Not on file  Social History Narrative   Not on file   Social Drivers of Health   Financial Resource Strain: High Risk (03/19/2019)   Overall Financial Resource Strain (CARDIA)    Difficulty of Paying Living Expenses: Very hard  Food Insecurity: Food Insecurity Present (02/23/2024)   Hunger Vital Sign    Worried About Running Out of Food in the Last Year: Often true    Ran Out of Food in the Last Year: Often true  Transportation Needs: Unmet Transportation Needs (02/23/2024)   PRAPARE - Administrator, Civil Service (Medical): Yes    Lack of Transportation (Non-Medical): Yes  Physical Activity: Not on file  Stress: Not on file  Social Connections: Patient Declined (06/30/2023)   Social Connection and Isolation Panel    Frequency of Communication with Friends and Family: Patient declined    Frequency of Social Gatherings with Friends and Family: Patient declined    Attends Religious Services: Patient declined    Database administrator or Organizations: Patient declined    Attends Banker Meetings: Patient declined    Marital Status: Patient declined   Past Medical History:  Past  Medical History:  Diagnosis Date   Alcohol  abuse    Anemia    Colitis    Depression    Severe major depression with psychotic features (HCC)    History reviewed. No pertinent surgical history.  Current Medications: Current Facility-Administered Medications  Medication Dose Route Frequency Provider Last Rate Last Admin   alum & mag hydroxide-simeth (MAALOX/MYLANTA) 200-200-20 MG/5ML suspension 30 mL  30 mL Oral Q4H PRN Nkwenti, Doris, NP       dextromethorphan-guaiFENesin (MUCINEX DM) 30-600 MG per 12 hr tablet 1 tablet  1 tablet Oral BID  Duncan, Hazel V, MD   1 tablet at 02/25/24 9161   haloperidol  (HALDOL ) tablet 5 mg  5 mg Oral TID PRN Tex Drilling, NP       And   diphenhydrAMINE  (BENADRYL ) capsule 50 mg  50 mg Oral TID PRN Tex Drilling, NP       haloperidol  lactate (HALDOL ) injection 5 mg  5 mg Intramuscular TID PRN Tex Drilling, NP       And   diphenhydrAMINE  (BENADRYL ) injection 50 mg  50 mg Intramuscular TID PRN Tex Drilling, NP       And   LORazepam  (ATIVAN ) injection 2 mg  2 mg Intramuscular TID PRN Tex Drilling, NP       haloperidol  lactate (HALDOL ) injection 10 mg  10 mg Intramuscular TID PRN Tex Drilling, NP       And   diphenhydrAMINE  (BENADRYL ) injection 50 mg  50 mg Intramuscular TID PRN Tex Drilling, NP       And   LORazepam  (ATIVAN ) injection 2 mg  2 mg Intramuscular TID PRN Tex Drilling, NP       DULoxetine  (CYMBALTA ) DR capsule 20 mg  20 mg Oral BID Smith, Annie B, NP   20 mg at 02/25/24 9161   feeding supplement (ENSURE PLUS HIGH PROTEIN) liquid 237 mL  237 mL Oral BID BM Alaira Level, MD   237 mL at 02/25/24 1101   hydrOXYzine  (ATARAX ) tablet 25 mg  25 mg Oral TID PRN Tex Drilling, NP       magnesium  hydroxide (MILK OF MAGNESIA) suspension 30 mL  30 mL Oral Daily PRN Tex Drilling, NP       multivitamin with minerals tablet 1 tablet  1 tablet Oral Daily Jenavi Beedle, MD   1 tablet at 02/25/24 0844   nicotine  (NICODERM CQ  - dosed in mg/24 hours) patch 14 mg  14 mg Transdermal Daily Tallie Hevia, MD   14 mg at 02/25/24 9160   oxyCODONE  (Oxy IR/ROXICODONE ) immediate release tablet 5 mg  5 mg Oral Q4H PRN Duncan, Hazel V, MD       traZODone  (DESYREL ) tablet 50 mg  50 mg Oral QHS PRN Tex Drilling, NP   50 mg at 02/24/24 2237    Lab Results:  Results for orders placed or performed during the hospital encounter of 02/23/24 (from the past 48 hours)  CBC with Differential/Platelet     Status: Abnormal   Collection Time: 02/23/24  9:09 PM  Result Value Ref Range   WBC 11.5 (H) 4.0  - 10.5 K/uL   RBC 3.59 (L) 4.22 - 5.81 MIL/uL   Hemoglobin 11.0 (L) 13.0 - 17.0 g/dL   HCT 65.7 (L) 60.9 - 47.9 %   MCV 95.3 80.0 - 100.0 fL   MCH 30.6 26.0 - 34.0 pg   MCHC 32.2 30.0 - 36.0 g/dL   RDW 86.9 88.4 - 84.4 %   Platelets 393 150 -  400 K/uL   nRBC 0.0 0.0 - 0.2 %   Neutrophils Relative % 57 %   Neutro Abs 6.7 1.7 - 7.7 K/uL   Lymphocytes Relative 29 %   Lymphs Abs 3.3 0.7 - 4.0 K/uL   Monocytes Relative 8 %   Monocytes Absolute 0.9 0.1 - 1.0 K/uL   Eosinophils Relative 4 %   Eosinophils Absolute 0.5 0.0 - 0.5 K/uL   Basophils Relative 1 %   Basophils Absolute 0.1 0.0 - 0.1 K/uL   Immature Granulocytes 1 %   Abs Immature Granulocytes 0.08 (H) 0.00 - 0.07 K/uL    Comment: Performed at [facility], [address] [place], [place] [address]  Comprehensive metabolic panel     Status: Abnormal   Collection Time: 02/23/24  9:09 PM  Result Value Ref Range   Sodium 142 135 - 145 mmol/L   Potassium 3.8 3.5 - 5.1 mmol/L   Chloride 104 98 - 111 mmol/L   CO2 26 22 - 32 mmol/L   Glucose, Bld 98 70 - 99 mg/dL    Comment: Glucose reference range applies only to samples taken after fasting for at least 8 hours.   BUN 18 6 - 20 mg/dL   Creatinine, Ser 9.01 0.61 - 1.24 mg/dL   Calcium 9.1 8.9 - 89.6 mg/dL   Total Protein 6.7 6.5 - 8.1 g/dL   Albumin 3.4 (L) 3.5 - 5.0 g/dL   AST 22 15 - 41 U/L   ALT 13 0 - 44 U/L   Alkaline Phosphatase 41 38 - 126 U/L   Total Bilirubin 0.3 0.0 - 1.2 mg/dL   GFR, Estimated >39 >39 mL/min    Comment: (NOTE) Calculated using the CKD-EPI Creatinine Equation (2021)    Anion gap 12 5 - 15    Comment: Performed at [facility], [address] [place], [place] [address]  D-dimer, quantitative     Status: None   Collection Time: 02/23/24  9:09 PM  Result Value Ref Range   D-Dimer, Quant 0.43 0.00 - 0.50 ug/mL-FEU    Comment: (NOTE) At the manufacturer cut-off value of 0.5 g/mL FEU, this assay has a negative predictive value  of 95-100%.This assay is intended for use in conjunction with a clinical pretest probability (PTP) assessment model to exclude pulmonary embolism (PE) and deep venous thrombosis (DVT) in outpatients suspected of PE or DVT. Results should be correlated with clinical presentation. Performed at [facility], [address] [place], [place] [address]   Troponin I (High Sensitivity)     Status: None   Collection Time: 02/23/24  9:09 PM  Result Value Ref Range   Troponin I (High Sensitivity) 7 <18 ng/L    Comment: (NOTE) Elevated high sensitivity troponin I (hsTnI) values and significant  changes across serial measurements may suggest ACS but many other  chronic and acute conditions are known to elevate hsTnI results.  Refer to the Links section for chest pain algorithms and additional  guidance. Performed at [facility], [address] [place], [place] [address]   Procalcitonin     Status: None   Collection Time: 02/23/24  9:09 PM  Result Value Ref Range   Procalcitonin <0.10 ng/mL    Comment:        Interpretation: PCT (Procalcitonin) <= 0.5 ng/mL: Systemic infection (sepsis) is not likely. Local bacterial infection is possible. (NOTE)       Sepsis PCT Algorithm  Lower Respiratory Tract                                      Infection PCT Algorithm    ----------------------------     ----------------------------         PCT < 0.25 ng/mL                PCT < 0.10 ng/mL          Strongly encourage             Strongly discourage   discontinuation of antibiotics    initiation of antibiotics    ----------------------------     -----------------------------       PCT 0.25 - 0.50 ng/mL            PCT 0.10 - 0.25 ng/mL               OR       >80% decrease in PCT            Discourage initiation of                                            antibiotics      Encourage discontinuation           of antibiotics    ----------------------------      -----------------------------         PCT >= 0.50 ng/mL              PCT 0.26 - 0.50 ng/mL               AND        <80% decrease in PCT             Encourage initiation of                                             antibiotics       Encourage continuation           of antibiotics    ----------------------------     -----------------------------        PCT >= 0.50 ng/mL                  PCT > 0.50 ng/mL               AND         increase in PCT                  Strongly encourage                                      initiation of antibiotics    Strongly encourage escalation           of antibiotics                                     -----------------------------  PCT <= 0.25 ng/mL                                                 OR                                        > 80% decrease in PCT                                      Discontinue / Do not initiate                                             antibiotics  Performed at [facility], [address] [place], [place] [address]   TSH     Status: None   Collection Time: 02/23/24  9:09 PM  Result Value Ref Range   TSH 1.477 0.350 - 4.500 uIU/mL    Comment: Performed by a 3rd Generation assay with a functional sensitivity of <=0.01 uIU/mL. Performed at [facility], [address] [place], [place] [address]   Troponin I (High Sensitivity)     Status: None   Collection Time: 02/23/24 11:28 PM  Result Value Ref Range   Troponin I (High Sensitivity) 7 <18 ng/L    Comment: (NOTE) Elevated high sensitivity troponin I (hsTnI) values and significant  changes across serial measurements may suggest ACS but many other  chronic and acute conditions are known to elevate hsTnI results.  Refer to the Links section for chest pain algorithms and additional  guidance. Performed at [facility], [address] [place], [place] [address]     Blood Alcohol  level:   Lab Results  Component Value Date   [facility] <15 02/22/2024   ETH <15 11/23/2023    Metabolic Disorder Labs: Lab Results  Component Value Date   HGBA1C 5.2 08/27/2014   MPG 103 08/27/2014   MPG 105 03/08/2013   No results found for: PROLACTIN No results found for: CHOL, TRIG, HDL, CHOLHDL, VLDL, LDLCALC  Physical Findings: AIMS:  , ,  ,  ,    CIWA:    COWS:      Psychiatric Specialty Exam:  Presentation  General Appearance:  Appropriate for Environment  Eye Contact: Good  Speech: Clear and Coherent  Speech Volume: Normal    Mood and Affect  Mood: Anxious  Affect: Appropriate   Thought Process  Thought Processes: Coherent; Linear  Orientation:Full (Time, Place and Person)  Thought Content:WDL  Hallucinations:Hallucinations: None  Ideas of Reference:None  Suicidal Thoughts:Suicidal Thoughts: No  Homicidal Thoughts:Homicidal Thoughts: No   Sensorium  Memory: Immediate Fair; Recent Fair; Remote Fair  Judgment: Fair  Insight: Fair   Art therapist  Concentration: Fair  Attention Span: Fair  Recall: [employer] of Knowledge: Fair  Language: Fair   Psychomotor Activity  Psychomotor Activity: Psychomotor Activity: Normal  Musculoskeletal: Strength & Muscle Tone: within normal limits Gait & Station: normal Assets  Assets: Manufacturing systems engineer; Desire for Improvement    Physical Exam: Physical Exam ROS Blood pressure 129/77, pulse 76, temperature  98.4 F (36.9 C), temperature source Oral, resp. rate 18, height 5' 7 (1.702 m), weight 51.7 kg, SpO2 99%. Body mass index is 17.85 kg/m.  Diagnosis: Principal Problem:   MDD (major depressive disorder) Active Problems:   Right leg pain   Sinus bradycardia   Chest pain   Underweight (BMI < 18.5)   History of sepsis secondary pneumonia (02/17/2024)   Finding of multiple premature atrial contractions by electrocardiography   History of substance abuse  (HCC)   Chronic anemia  reatment Plan Summary:   Safety and Monitoring:             -- Voluntary admission to inpatient psychiatric unit for safety, stabilization and treatment             -- Daily contact with patient to assess and evaluate symptoms and progress in treatment             -- Patient's case to be discussed in multi-disciplinary team meeting             -- Observation Level: q15 minute checks             -- Vital signs:  q12 hours             -- Precautions: suicide, elopement, and assault   2. Psychiatric Diagnoses and Treatment:    -Patient agreeable to starting Cymbalta  20 mg 2 times daily to help target depression symptoms.  We discussed sertraline  initially due to its favorable cardiac profile, but patient reported he had an allergy to this medication and it is listed in patient's known allergies that patient has GI side effects from this medication.  Cymbalta  recommendation was discussed with cardiology team who came to assess patient and reported that patient's EKG was reassuring and that Cymbalta  would be a safe option to prescribe patient in regards to cardiac profile.            -- The risks/benefits/side-effects/alternatives to this medication were discussed in detail with the patient and time was given for questions. The patient consents to medication trial.                -- Metabolic profile and EKG monitoring obtained while on an atypical antipsychotic (BMI: Lipid Panel: HbgA1c: QTc:)              -- Encouraged patient to participate in unit milieu and in scheduled group therapies                            3. Medical Issues Being Addressed:  Patient required rapid response 02/23/24 night due to elevated blood pressure, decreased heart rate, and feeling unwell.  Patient was assessed by hospital MD as well as cardiology.  Their notes can be found in patient chart review.  We will continue to monitor patient's blood pressure while on the unit as patient has had some  elevated readings-mainly systolically while on the inpatient unit.  We will continue to reach out to hospital MD team for further recommendations.  4. Discharge Planning:   -- Social work and case management to assist with discharge planning and identification of hospital follow-up needs prior to discharge  -- Estimated LOS: 3-4 days  Allyn Foil, MD 02/25/2024, 12:23 PM

## 2024-02-26 NOTE — Progress Notes (Signed)
   02/26/24 1500  Psych Admission Type (Psych Patients Only)  Admission Status Voluntary  Psychosocial Assessment  Patient Complaints Anxiety  Eye Contact Fair  Facial Expression Other (Comment) (WNL)  Affect Appropriate to circumstance  Speech Logical/coherent  Interaction Assertive  Motor Activity Slow  Appearance/Hygiene Unremarkable  Behavior Characteristics Cooperative;Appropriate to situation  Mood Pleasant  Thought Process  Coherency WDL  Content WDL  Delusions None reported or observed  Perception WDL  Hallucination None reported or observed  Judgment Poor  Confusion None  Danger to Self  Current suicidal ideation? Denies  Self-Injurious Behavior No self-injurious ideation or behavior indicators observed or expressed   Agreement Not to Harm Self Yes  Description of Agreement Verbal  Danger to Others  Danger to Others None reported or observed   [name] got assistance from his social work to fill out his paperwork today. After meeting this goal, he was significantly more relaxed.

## 2024-02-26 NOTE — Plan of Care (Signed)

## 2024-02-26 NOTE — Group Note (Signed)
 Recreation Therapy Group Note   Group Topic:Goal Setting  Group Date: 02/26/2024 Start Time: 1000 End Time: 1040 Facilitators: Celestia Jeoffrey BRAVO, LRT, CTRS Location: Craft Room  Group description: Now Future Wall. Patients were given a sheet of paper and asked to fold it into 3 sections, like a pamphlet. Top section, patients were encouraged to write what they are feeling or experiencing "now". The bottom section, patients were asked to fill out how they want to feel or things they want to experience in the "future".  In the middle section, patients were encouraged to fill out any "walls" or barriers that are getting in the way of them reaching their "future". On the back of the sheet, patients were encouraged to write positive coping skills that will help them get over or though the walls they experience. LRT and patients discussed each of the sections and shared them aloud in group. Patients are encouraged to keep this paper with them as a guide/plan post discharge.     Goal Area(s) Addressed:    Patients will identify walls/triggers.   Patients will identify and list coping skills to use post-discharge.   Patients will work on goal setting, short or long-term.   Patients will work on communication by reading aloud to group and engaging in post activity discussion.    Affect/Mood: N/A   Participation Level: Did not attend    Clinical Observations/Individualized Feedback: Patient did not attend group.  Plan: Continue to engage patient in RT group sessions 2-3x/week.   Jeoffrey BRAVO Celestia, LRT, CTRS 02/26/2024 10:52 AM

## 2024-02-26 NOTE — Progress Notes (Signed)
   02/25/24 2000  Psych Admission Type (Psych Patients Only)  Admission Status Voluntary  Psychosocial Assessment  Patient Complaints Malaise  Eye Contact Fair  Facial Expression Flat  Affect Appropriate to circumstance  Speech Logical/coherent  Interaction Assertive  Motor Activity Slow  Appearance/Hygiene Improved  Behavior Characteristics Cooperative;Unable to participate;Appropriate to situation  Mood Pleasant  Thought Process  Coherency WDL  Content WDL  Delusions None reported or observed  Perception WDL  Hallucination None reported or observed  Judgment Poor  Confusion None  Danger to Self  Current suicidal ideation? Denies  Self-Injurious Behavior Some self-injurious ideation observed or expressed.  No lethal plan expressed   Agreement Not to Harm Self Yes  Description of Agreement Verbal   No distress noted , interacting appropriately with peers and staff, thoughts are organized no distress noted.

## 2024-02-26 NOTE — Group Note (Signed)
 Date:  02/26/2024 Time:  9:14 PM  Group Topic/Focus:  Wrap-Up Group:   The focus of this group is to help patients review their daily goal of treatment and discuss progress on daily workbooks.    Participation Level:  Active  Participation Quality:  Appropriate and Attentive  Affect:  Appropriate  Cognitive:  Alert  Insight: Appropriate  Engagement in Group:  Engaged  Modes of Intervention:  Discussion  Additional Comments:     Maglione,[name] 02/26/2024, 9:14 PM

## 2024-02-26 NOTE — Group Note (Signed)
 Date:  02/26/2024 Time:  2:21 AM  Group Topic/Focus:  Identifying Needs:   The focus of this group is to help patients identify their personal needs that have been historically problematic and identify healthy behaviors to address their needs. Making Healthy Choices:   The focus of this group is to help patients identify negative/unhealthy choices they were using prior to admission and identify positive/healthier coping strategies to replace them upon discharge. Self Care:   The focus of this group is to help patients understand the importance of self-care in order to improve or restore emotional, physical, spiritual, interpersonal, and financial health. Wrap-Up Group:   The focus of this group is to help patients review their daily goal of treatment and discuss progress on daily workbooks.    Participation Level:  Active  Participation Quality:  Appropriate and Attentive  Affect:  Appropriate  Cognitive:  Alert, Appropriate, and Oriented  Insight: Appropriate and Good  Engagement in Group:  Engaged  Modes of Intervention:  Discussion and Support  Additional Comments:  N/A  Butler LITTIE Gelineau 02/26/2024, 2:21 AM

## 2024-02-26 NOTE — Progress Notes (Signed)
 [facility] MD Progress Note  02/26/2024 1:42 PM [name]  MRN:  [id number]  [name] is a 53 y.o. year-old male with a history of recurrent depression and anxiety for multiple years, which has only increased in intensity over the past few years because of multiple family losses, as well as chronic financial issues. He presented to the ED with SI with no plan. Patient is admitted to adult psych unit with Q15 min safety monitoring. Multidisciplinary team approach is offered. Medication management; group/milieu therapy is offered.  Subjective:  Chart reviewed, case discussed in multidisciplinary meeting, patient seen during rounds.   Patient is noted to be resting in bed and reading a book.  Patient reports feeling fine and denies any complaints.  He reports having fair appetite and sleep.  He denies SI/HI/plan and denies auditory/visual hallucinations.  He denies having any side effects to medications Sleep: Fair  Appetite:  Fair  Past Psychiatric History: see h&P Family History:  Family History  Problem Relation Age of Onset   Hypertension Mother    Hypertension Father    Hypertension Other    Social History:  Social History   Substance and Sexual Activity  Alcohol  Use No     Social History   Substance and Sexual Activity  Drug Use Yes   Types: Marijuana    Social History   Socioeconomic History   Marital status: Single    Spouse name: Not on file   Number of children: Not on file   Years of education: Not on file   Highest education level: Not on file  Occupational History   Not on file  Tobacco Use   Smoking status: Every Day    Types: Cigarettes   Smokeless tobacco: Never  Vaping Use   Vaping status: Never Used  Substance and Sexual Activity   Alcohol  use: No   Drug use: Yes    Types: Marijuana   Sexual activity: Yes    Birth control/protection: Condom  Other Topics Concern   Not on file  Social History Narrative   Not on file   Social Drivers of  Health   Financial Resource Strain: High Risk (03/19/2019)   Overall Financial Resource Strain (CARDIA)    Difficulty of Paying Living Expenses: Very hard  Food Insecurity: Food Insecurity Present (02/23/2024)   Hunger Vital Sign    Worried About Running Out of Food in the Last Year: Often true    Ran Out of Food in the Last Year: Often true  Transportation Needs: Unmet Transportation Needs (02/23/2024)   PRAPARE - Administrator, Civil Service (Medical): Yes    Lack of Transportation (Non-Medical): Yes  Physical Activity: Not on file  Stress: Not on file  Social Connections: Patient Declined (06/30/2023)   Social Connection and Isolation Panel    Frequency of Communication with Friends and Family: Patient declined    Frequency of Social Gatherings with Friends and Family: Patient declined    Attends Religious Services: Patient declined    Database Administrator or Organizations: Patient declined    Attends Banker Meetings: Patient declined    Marital Status: Patient declined   Past Medical History:  Past Medical History:  Diagnosis Date   Alcohol  abuse    Anemia    Colitis    Depression    Severe major depression with psychotic features (HCC)    History reviewed. No pertinent surgical history.  Current Medications: Current Facility-Administered Medications  Medication Dose Route Frequency  Provider Last Rate Last Admin   alum & mag hydroxide-simeth (MAALOX/MYLANTA) 200-200-20 MG/5ML suspension 30 mL  30 mL Oral Q4H PRN Nkwenti, Doris, NP       dextromethorphan-guaiFENesin (MUCINEX DM) 30-600 MG per 12 hr tablet 1 tablet  1 tablet Oral BID Duncan, Hazel V, MD   1 tablet at 02/26/24 0855   haloperidol  (HALDOL ) tablet 5 mg  5 mg Oral TID PRN Tex Drilling, NP       And   diphenhydrAMINE  (BENADRYL ) capsule 50 mg  50 mg Oral TID PRN Tex Drilling, NP       haloperidol  lactate (HALDOL ) injection 5 mg  5 mg Intramuscular TID PRN Tex Drilling, NP        And   diphenhydrAMINE  (BENADRYL ) injection 50 mg  50 mg Intramuscular TID PRN Tex Drilling, NP       And   LORazepam  (ATIVAN ) injection 2 mg  2 mg Intramuscular TID PRN Tex Drilling, NP       haloperidol  lactate (HALDOL ) injection 10 mg  10 mg Intramuscular TID PRN Tex Drilling, NP       And   diphenhydrAMINE  (BENADRYL ) injection 50 mg  50 mg Intramuscular TID PRN Tex Drilling, NP       And   LORazepam  (ATIVAN ) injection 2 mg  2 mg Intramuscular TID PRN Tex Drilling, NP       DULoxetine  (CYMBALTA ) DR capsule 20 mg  20 mg Oral BID Smith, Annie B, NP   20 mg at 02/26/24 0855   feeding supplement (ENSURE PLUS HIGH PROTEIN) liquid 237 mL  237 mL Oral BID BM Cincere Deprey, MD   237 mL at 02/26/24 1118   hydrOXYzine  (ATARAX ) tablet 25 mg  25 mg Oral TID PRN Tex Drilling, NP       magnesium  hydroxide (MILK OF MAGNESIA) suspension 30 mL  30 mL Oral Daily PRN Tex Drilling, NP       multivitamin with minerals tablet 1 tablet  1 tablet Oral Daily Seferina Brokaw, MD   1 tablet at 02/26/24 0854   nicotine  (NICODERM CQ  - dosed in mg/24 hours) patch 14 mg  14 mg Transdermal Daily Jashon Ishida, MD   14 mg at 02/26/24 9143   oxyCODONE  (Oxy IR/ROXICODONE ) immediate release tablet 5 mg  5 mg Oral Q4H PRN Duncan, Hazel V, MD       traZODone  (DESYREL ) tablet 50 mg  50 mg Oral QHS PRN Tex Drilling, NP   50 mg at 02/25/24 2140    Lab Results:  No results found for this or any previous visit (from the past 48 hours).   Blood Alcohol  level:  Lab Results  Component Value Date   [facility] <15 02/22/2024   ETH <15 11/23/2023    Metabolic Disorder Labs: Lab Results  Component Value Date   HGBA1C 5.2 08/27/2014   MPG 103 08/27/2014   MPG 105 03/08/2013   No results found for: PROLACTIN No results found for: CHOL, TRIG, HDL, CHOLHDL, VLDL, LDLCALC  Physical Findings: AIMS:  , ,  ,  ,    CIWA:    COWS:      Psychiatric Specialty Exam:  Presentation  General Appearance:   Appropriate for Environment  Eye Contact: Good  Speech: Clear and Coherent  Speech Volume: Normal    Mood and Affect  Mood: Anxious  Affect: Appropriate   Thought Process  Thought Processes: Coherent; Linear  Orientation:Full (Time, Place and Person)  Thought Content:WDL  Hallucinations: Denies  Ideas  of Reference:None  Suicidal Thoughts: Denies  Homicidal Thoughts: Denies   Sensorium  Memory: Immediate Fair; Recent Fair; Remote Fair  Judgment: Fair  Insight: Fair   Chartered Certified Accountant: Fair  Attention Span: Fair  Recall: [employer] of Knowledge: Fair  Language: Fair   Psychomotor Activity  Psychomotor Activity: No data recorded  Musculoskeletal: Strength & Muscle Tone: within normal limits Gait & Station: normal Assets  Assets: Manufacturing Systems Engineer; Desire for Improvement    Physical Exam: Physical Exam Vitals and nursing note reviewed.    ROS Blood pressure (!) 129/90, pulse 74, temperature 98.1 F (36.7 C), temperature source Oral, resp. rate 17, height 5' 7 (1.702 m), weight 51.7 kg, SpO2 100%. Body mass index is 17.85 kg/m.  Diagnosis: Principal Problem:   MDD (major depressive disorder) Active Problems:   Right leg pain   Sinus bradycardia   Chest pain   Underweight (BMI < 18.5)   History of sepsis secondary pneumonia (02/17/2024)   Finding of multiple premature atrial contractions by electrocardiography   History of substance abuse (HCC)   Chronic anemia   MDD (major depressive disorder), recurrent episode, moderate (HCC)  reatment Plan Summary:   Safety and Monitoring:             -- Voluntary admission to inpatient psychiatric unit for safety, stabilization and treatment             -- Daily contact with patient to assess and evaluate symptoms and progress in treatment             -- Patient's case to be discussed in multi-disciplinary team meeting             -- Observation Level:  q15 minute checks             -- Vital signs:  q12 hours             -- Precautions: suicide, elopement, and assault   2. Psychiatric Diagnoses and Treatment:    -Patient agreeable to starting Cymbalta  20 mg 2 times daily to help target depression symptoms.  We discussed sertraline  initially due to its favorable cardiac profile, but patient reported he had an allergy to this medication and it is listed in patient's known allergies that patient has GI side effects from this medication.  Cymbalta  recommendation was discussed with cardiology team who came to assess patient and reported that patient's EKG was reassuring and that Cymbalta  would be a safe option to prescribe patient in regards to cardiac profile.            -- The risks/benefits/side-effects/alternatives to this medication were discussed in detail with the patient and time was given for questions. The patient consents to medication trial.                -- Metabolic profile and EKG monitoring obtained while on an atypical antipsychotic (BMI: Lipid Panel: HbgA1c: QTc:)              -- Encouraged patient to participate in unit milieu and in scheduled group therapies                            3. Medical Issues Being Addressed:  Patient required rapid response 02/23/24 night due to elevated blood pressure, decreased heart rate, and feeling unwell.  Patient was assessed by hospital MD as well as cardiology.  Their notes can be found in patient chart review.  We will continue to monitor patient's blood pressure while on the unit as patient has had some elevated readings-mainly systolically while on the inpatient unit.  We will continue to reach out to hospital MD team for further recommendations.  4. Discharge Planning:   -- Social work and case management to assist with discharge planning and identification of hospital follow-up needs prior to discharge  -- Estimated LOS: 3-4 days  Allyn Foil, MD 02/26/2024, 1:42 PM

## 2024-02-26 NOTE — [facility] Suicide Risk Assessment (Signed)
 [facility] INPATIENT:  Family/Significant Other Suicide Prevention Education  Suicide Prevention Education:  Education Completed; [name], daughter, [phone],   has been identified by the patient as the family member/significant other with whom the patient will be residing, and identified as the person(s) who will aid the patient in the event of a mental health crisis (suicidal ideations/suicide attempt).  With written consent from the patient, the family member/significant other has been provided the following suicide prevention education, prior to the and/or following the discharge of the patient.  The suicide prevention education provided includes the following: Suicide risk factors Suicide prevention and interventions National Suicide Hotline telephone number [facility] assessment telephone number [facility] Emergency Assistance 911 [facility] and/or Residential Mobile Crisis Unit telephone number  Request made of family/significant other to: Remove weapons (e.g., guns, rifles, knives), all items previously/currently identified as safety concern.   Remove drugs/medications (over-the-counter, prescriptions, illicit drugs), all items previously/currently identified as a safety concern.  The family member/significant other verbalizes understanding of the suicide prevention education information provided.  The family member/significant other agrees to remove the items of safety concern listed above.  The patient daughter had concerns about the patient getting appointments setup. The daughter stated that the patient has a history of SI but no attempts.     [name] 02/26/2024, 4:43 PM

## 2024-02-26 NOTE — Group Note (Signed)
 Date:  02/26/2024 Time:  1:55 PM  Group Topic/Focus:    Chief Of Staff. Patients were given the opportunity to go outside to the courtyard to play basketball, corn-hole, draw with chalk and listen to music while enjoying fresh air and sunlight.    Participation Level:  Did Not Attend   [name] 02/26/2024, 1:55 PM

## 2024-02-26 NOTE — Plan of Care (Signed)
  Problem: Health Behavior/Discharge Planning: Goal: Ability to manage health-related needs will improve Outcome: Progressing   Problem: Clinical Measurements: Goal: Ability to maintain clinical measurements within normal limits will improve Outcome: Progressing   Problem: Clinical Measurements: Goal: Respiratory complications will improve Outcome: Progressing   Problem: Clinical Measurements: Goal: Cardiovascular complication will be avoided Outcome: Progressing   Problem: Pain Managment: Goal: General experience of comfort will improve and/or be controlled Outcome: Progressing   Problem: Safety: Goal: Ability to remain free from injury will improve Outcome: Progressing   Problem: Coping: Goal: Level of anxiety will decrease Outcome: Progressing

## 2024-02-27 LAB — LIPID PANEL
Cholesterol: 201 mg/dL — ABNORMAL HIGH (ref 0–200)
HDL: 75 mg/dL (ref >40)
LDL Cholesterol: 111 mg/dL — ABNORMAL HIGH (ref 0–99)
Total CHOL/HDL Ratio: 2.7 ratio
Triglycerides: 76 mg/dL (ref <150)
VLDL: 15 mg/dL (ref 0–40)

## 2024-02-27 LAB — HEMOGLOBIN A1C
Hgb A1c MFr Bld: 4.6 % — ABNORMAL LOW (ref 4.8–5.6)
Mean Plasma Glucose: 85.32 mg/dL

## 2024-02-27 MED ORDER — DULOXETINE HCL 30 MG PO CPEP
30.0000 mg | ORAL_CAPSULE | Freq: Two times a day (BID) | ORAL | Status: DC
  Administered 2024-02-28: 30 mg via ORAL
  Filled 2024-02-27: qty 1

## 2024-02-27 NOTE — Progress Notes (Signed)
   02/27/24 2240  Psych Admission Type (Psych Patients Only)  Admission Status Voluntary  Psychosocial Assessment  Patient Complaints Sleep disturbance  Eye Contact Fair  Facial Expression Flat  Affect Appropriate to circumstance  Speech Logical/coherent  Interaction Assertive  Motor Activity Slow  Appearance/Hygiene Unremarkable  Behavior Characteristics Cooperative  Mood Pleasant  Aggressive Behavior  Effect No apparent injury  Thought Process  Coherency WDL  Content WDL  Delusions None reported or observed  Perception WDL  Hallucination None reported or observed  Judgment WDL  Confusion WDL  Danger to Self  Current suicidal ideation? Denies  Self-Injurious Behavior No self-injurious ideation or behavior indicators observed or expressed   Agreement Not to Harm Self Yes  Description of Agreement verbal  Danger to Others  Danger to Others None reported or observed

## 2024-02-27 NOTE — Group Note (Signed)
 Date:  02/27/2024 Time:  9:50 PM  Group Topic/Focus:  Making Healthy Choices:   The focus of this group is to help patients identify negative/unhealthy choices they were using prior to admission and identify positive/healthier coping strategies to replace them upon discharge. Wrap-Up Group:   The focus of this group is to help patients review their daily goal of treatment and discuss progress on daily workbooks.    Participation Level:  Active  Participation Quality:  Appropriate and Attentive  Affect:  Appropriate  Cognitive:  Alert, Appropriate, and Oriented  Insight: Appropriate and Good  Engagement in Group:  Engaged  Modes of Intervention:  Discussion and Support  Additional Comments:  N/A  Butler LITTIE Gelineau 02/27/2024, 9:50 PM

## 2024-02-27 NOTE — Progress Notes (Signed)
   02/26/24 2000  Psych Admission Type (Psych Patients Only)  Admission Status Voluntary  Psychosocial Assessment  Patient Complaints Anxiety  Eye Contact Fair  Facial Expression Flat  Affect Appropriate to circumstance  Speech Logical/coherent  Interaction Assertive  Motor Activity Slow  Appearance/Hygiene Improved  Behavior Characteristics Cooperative;Appropriate to situation  Mood Pleasant  Thought Process  Coherency WDL  Content WDL  Delusions None reported or observed  Perception WDL  Hallucination None reported or observed  Judgment Poor  Confusion None  Danger to Self  Current suicidal ideation? Denies  Self-Injurious Behavior Some self-injurious ideation observed or expressed.  No lethal plan expressed   Agreement Not to Harm Self Yes  Description of Agreement Verbal   Patient alert and oriented x 4, affect is flat but brightens upon approach , he denies SI/HI/AVH thoughts are organized affect is congruent wiith mood. 15 minutes safety checks maintained.

## 2024-02-27 NOTE — Progress Notes (Signed)
 [facility] MD Progress Note  02/27/2024 1:43 PM Marquavion Venhuizen  MRN:  [id number]  [name] is a 53 y.o. year-old male with a history of recurrent depression and anxiety for multiple years, which has only increased in intensity over the past few years because of multiple family losses, as well as chronic financial issues. He presented to the ED with SI with no plan. Patient is admitted to adult psych unit with Q15 min safety monitoring. Multidisciplinary team approach is offered. Medication management; group/milieu therapy is offered.  Subjective:  Chart reviewed, case discussed in multidisciplinary meeting, patient seen during rounds.   Patient is noted to be resting in bed.  He offers no complaints.  He reports improvement in his depression and anxiety.  He denies auditory/visual hallucinations.  He denies current SI/HI/plan.  He remains discharge oriented and is willing to participate in outpatient mental health services. Sleep: Fair  Appetite:  Fair  Past Psychiatric History: see h&P Family History:  Family History  Problem Relation Age of Onset   Hypertension Mother    Hypertension Father    Hypertension Other    Social History:  Social History   Substance and Sexual Activity  Alcohol  Use No     Social History   Substance and Sexual Activity  Drug Use Yes   Types: Marijuana    Social History   Socioeconomic History   Marital status: Single    Spouse name: Not on file   Number of children: Not on file   Years of education: Not on file   Highest education level: Not on file  Occupational History   Not on file  Tobacco Use   Smoking status: Every Day    Types: Cigarettes   Smokeless tobacco: Never  Vaping Use   Vaping status: Never Used  Substance and Sexual Activity   Alcohol  use: No   Drug use: Yes    Types: Marijuana   Sexual activity: Yes    Birth control/protection: Condom  Other Topics Concern   Not on file  Social History Narrative   Not on file    Social Drivers of Health   Financial Resource Strain: High Risk (03/19/2019)   Overall Financial Resource Strain (CARDIA)    Difficulty of Paying Living Expenses: Very hard  Food Insecurity: Food Insecurity Present (02/23/2024)   Hunger Vital Sign    Worried About Running Out of Food in the Last Year: Often true    Ran Out of Food in the Last Year: Often true  Transportation Needs: Unmet Transportation Needs (02/23/2024)   PRAPARE - Administrator, Civil Service (Medical): Yes    Lack of Transportation (Non-Medical): Yes  Physical Activity: Not on file  Stress: Not on file  Social Connections: Patient Declined (06/30/2023)   Social Connection and Isolation Panel    Frequency of Communication with Friends and Family: Patient declined    Frequency of Social Gatherings with Friends and Family: Patient declined    Attends Religious Services: Patient declined    Database Administrator or Organizations: Patient declined    Attends Banker Meetings: Patient declined    Marital Status: Patient declined   Past Medical History:  Past Medical History:  Diagnosis Date   Alcohol  abuse    Anemia    Colitis    Depression    Severe major depression with psychotic features (HCC)    History reviewed. No pertinent surgical history.  Current Medications: Current Facility-Administered Medications  Medication Dose Route  Frequency Provider Last Rate Last Admin   alum & mag hydroxide-simeth (MAALOX/MYLANTA) 200-200-20 MG/5ML suspension 30 mL  30 mL Oral Q4H PRN Nkwenti, Doris, NP       dextromethorphan-guaiFENesin (MUCINEX DM) 30-600 MG per 12 hr tablet 1 tablet  1 tablet Oral BID Duncan, Hazel V, MD   1 tablet at 02/26/24 1657   haloperidol  (HALDOL ) tablet 5 mg  5 mg Oral TID PRN Tex Drilling, NP       And   diphenhydrAMINE  (BENADRYL ) capsule 50 mg  50 mg Oral TID PRN Tex Drilling, NP       haloperidol  lactate (HALDOL ) injection 5 mg  5 mg Intramuscular TID PRN  Tex Drilling, NP       And   diphenhydrAMINE  (BENADRYL ) injection 50 mg  50 mg Intramuscular TID PRN Tex Drilling, NP       And   LORazepam  (ATIVAN ) injection 2 mg  2 mg Intramuscular TID PRN Tex Drilling, NP       haloperidol  lactate (HALDOL ) injection 10 mg  10 mg Intramuscular TID PRN Tex Drilling, NP       And   diphenhydrAMINE  (BENADRYL ) injection 50 mg  50 mg Intramuscular TID PRN Tex Drilling, NP       And   LORazepam  (ATIVAN ) injection 2 mg  2 mg Intramuscular TID PRN Tex Drilling, NP       DULoxetine  (CYMBALTA ) DR capsule 20 mg  20 mg Oral BID Smith, Annie B, NP   20 mg at 02/27/24 9188   feeding supplement (ENSURE PLUS HIGH PROTEIN) liquid 237 mL  237 mL Oral BID BM Manford Sprong, MD   237 mL at 02/27/24 1019   hydrOXYzine  (ATARAX ) tablet 25 mg  25 mg Oral TID PRN Tex Drilling, NP       magnesium  hydroxide (MILK OF MAGNESIA) suspension 30 mL  30 mL Oral Daily PRN Tex Drilling, NP       multivitamin with minerals tablet 1 tablet  1 tablet Oral Daily Corney Knighton, MD   1 tablet at 02/27/24 9188   nicotine  (NICODERM CQ  - dosed in mg/24 hours) patch 14 mg  14 mg Transdermal Daily Lenora Gomes, MD   14 mg at 02/27/24 0811   oxyCODONE  (Oxy IR/ROXICODONE ) immediate release tablet 5 mg  5 mg Oral Q4H PRN Duncan, Hazel V, MD       traZODone  (DESYREL ) tablet 50 mg  50 mg Oral QHS PRN Tex Drilling, NP   50 mg at 02/26/24 2112    Lab Results:  Results for orders placed or performed during the hospital encounter of 02/23/24 (from the past 48 hours)  Lipid panel     Status: Abnormal   Collection Time: 02/27/24  8:19 AM  Result Value Ref Range   Cholesterol 201 (H) 0 - 200 mg/dL   Triglycerides 76 <849 mg/dL   HDL 75 >59 mg/dL   Total CHOL/HDL Ratio 2.7 RATIO   VLDL 15 0 - 40 mg/dL   LDL Cholesterol 888 (H) 0 - 99 mg/dL    Comment:        Total Cholesterol/HDL:CHD Risk Coronary Heart Disease Risk Table                     Men   Women  1/2 Average Risk   3.4    3.3  Average Risk       5.0   4.4  2 X Average Risk   9.6   7.1  3 X Average Risk  23.4   11.0        Use the calculated Patient Ratio above and the CHD Risk Table to determine the patient's CHD Risk.        ATP III CLASSIFICATION (LDL):  <100     mg/dL   Optimal  899-870  mg/dL   Near or Above                    Optimal  130-159  mg/dL   Borderline  839-810  mg/dL   High  >809     mg/dL   Very High Performed at [facility], [address] [place], [place] [address]      Blood Alcohol  level:  Lab Results  Component Value Date   [facility] <15 02/22/2024   [facility] <15 11/23/2023    Metabolic Disorder Labs: Lab Results  Component Value Date   HGBA1C 5.2 08/27/2014   MPG 103 08/27/2014   MPG 105 03/08/2013   No results found for: PROLACTIN Lab Results  Component Value Date   CHOL 201 (H) 02/27/2024   TRIG 76 02/27/2024   HDL 75 02/27/2024   CHOLHDL 2.7 02/27/2024   VLDL 15 02/27/2024   LDLCALC 111 (H) 02/27/2024    Physical Findings: AIMS:  , ,  ,  ,    CIWA:    COWS:      Psychiatric Specialty Exam:  Presentation  General Appearance:  Appropriate for Environment  Eye Contact: Good  Speech: Clear and Coherent  Speech Volume: Normal    Mood and Affect  Mood: Anxious  Affect: Appropriate   Thought Process  Thought Processes: Coherent; Linear  Orientation:Full (Time, Place and Person)  Thought Content:WDL  Hallucinations: Denies  Ideas of Reference:None  Suicidal Thoughts: Denies  Homicidal Thoughts: Denies   Sensorium  Memory: Immediate Fair; Recent Fair; Remote Fair  Judgment: Fair  Insight: Fair   Art Therapist  Concentration: Fair  Attention Span: Fair  Recall: [employer] of Knowledge: Fair  Language: Fair   Psychomotor Activity  Psychomotor Activity: No data recorded  Musculoskeletal: Strength & Muscle Tone: within normal limits Gait & Station: normal Assets   Assets: Manufacturing Systems Engineer; Desire for Improvement    Physical Exam: Physical Exam Vitals and nursing note reviewed.    ROS Blood pressure 104/64, pulse (!) 41, temperature 98.1 F (36.7 C), temperature source Oral, resp. rate 17, height 5' 7 (1.702 m), weight 51.7 kg, SpO2 100%. Body mass index is 17.85 kg/m.  Diagnosis: Principal Problem:   MDD (major depressive disorder) Active Problems:   Right leg pain   Sinus bradycardia   Chest pain   Underweight (BMI < 18.5)   History of sepsis secondary pneumonia (02/17/2024)   Finding of multiple premature atrial contractions by electrocardiography   History of substance abuse (HCC)   Chronic anemia   MDD (major depressive disorder), recurrent episode, moderate (HCC)  reatment Plan Summary:   Safety and Monitoring:             -- Voluntary admission to inpatient psychiatric unit for safety, stabilization and treatment             -- Daily contact with patient to assess and evaluate symptoms and progress in treatment             -- Patient's case to be discussed in multi-disciplinary team meeting             -- Observation Level: q15  minute checks             -- Vital signs:  q12 hours             -- Precautions: suicide, elopement, and assault   2. Psychiatric Diagnoses and Treatment:    -Patient agreeable to starting Cymbalta  20 mg 2 times daily to help target depression symptoms.  We discussed sertraline  initially due to its favorable cardiac profile, but patient reported he had an allergy to this medication and it is listed in patient's known allergies that patient has GI side effects from this medication.  Cymbalta  recommendation was discussed with cardiology team who came to assess patient and reported that patient's EKG was reassuring and that Cymbalta  would be a safe option to prescribe patient in regards to cardiac profile.            -- The risks/benefits/side-effects/alternatives to this medication were discussed in  detail with the patient and time was given for questions. The patient consents to medication trial.                -- Metabolic profile and EKG monitoring obtained while on an atypical antipsychotic (BMI: Lipid Panel: HbgA1c: QTc:)              -- Encouraged patient to participate in unit milieu and in scheduled group therapies                            3. Medical Issues Being Addressed:  Patient required rapid response 02/23/24 night due to elevated blood pressure, decreased heart rate, and feeling unwell.  Patient was assessed by hospital MD as well as cardiology.  Their notes can be found in patient chart review.  We will continue to monitor patient's blood pressure while on the unit as patient has had some elevated readings-mainly systolically while on the inpatient unit.  We will continue to reach out to hospital MD team for further recommendations.  4. Discharge Planning:   -- Social work and case management to assist with discharge planning and identification of hospital follow-up needs prior to discharge  -- Estimated LOS: 3-4 days  Allyn Foil, MD 02/27/2024, 1:43 PM

## 2024-02-27 NOTE — Group Note (Signed)
 LCSW Group Therapy Note  Group Date: 02/27/2024 Start Time: 1300 End Time: 1400   Type of Therapy and Topic:  Group Therapy: Positive Affirmations  Participation Level:  Did Not Attend   Description of Group:   This group addressed positive affirmation towards self and others.  Patients went around the room and identified two positive things about themselves and two positive things about a peer in the room.  Patients reflected on how it felt to share something positive with others, to identify positive things about themselves, and to hear positive things from others/ Patients were encouraged to have a daily reflection of positive characteristics or circumstances.   Therapeutic Goals: Patients will verbalize two of their positive qualities Patients will demonstrate empathy for others by stating two positive qualities about a peer in the group Patients will verbalize their feelings when voicing positive self affirmations and when voicing positive affirmations of others Patients will discuss the potential positive impact on their wellness/recovery of focusing on positive traits of self and others.  Summary of Patient Progress:  Patient did not attend.   Therapeutic Modalities:   Cognitive Behavioral Therapy Motivational Interviewing    Alveta CHRISTELLA Kerns, [place] 02/27/2024  2:56 PM

## 2024-02-27 NOTE — Plan of Care (Signed)
   Problem: Education: Goal: Knowledge of General Education information will improve Description Including pain rating scale, medication(s)/side effects and non-pharmacologic comfort measures Outcome: Progressing   Problem: Clinical Measurements: Goal: Ability to maintain clinical measurements within normal limits will improve Outcome: Progressing   Problem: Nutrition: Goal: Adequate nutrition will be maintained Outcome: Progressing   Problem: Coping: Goal: Level of anxiety will decrease Outcome: Progressing   Problem: Safety: Goal: Ability to remain free from injury will improve Outcome: Progressing

## 2024-02-27 NOTE — Group Note (Signed)
 Date:  02/27/2024 Time:  4:55 PM  Group Topic/Focus:  Wellness Toolbox:   The focus of this group is to discuss various aspects of wellness, balancing those aspects and exploring ways to increase the ability to experience wellness.  Patients will create a wellness toolbox for use upon discharge.    Participation Level:  Did Not Attend   Deitra Clap [facility] 02/27/2024, 4:55 PM

## 2024-02-27 NOTE — Progress Notes (Signed)
   02/27/24 0831  Psych Admission Type (Psych Patients Only)  Admission Status Voluntary  Psychosocial Assessment  Patient Complaints Sleep disturbance  Eye Contact Fair  Facial Expression Flat  Affect Appropriate to circumstance  Speech Logical/coherent  Interaction Assertive  Motor Activity Slow  Appearance/Hygiene Unremarkable  Behavior Characteristics Cooperative  Mood Pleasant  Aggressive Behavior  Effect No apparent injury  Thought Process  Coherency WDL  Content WDL  Delusions None reported or observed  Perception WDL  Hallucination None reported or observed  Judgment WDL  Confusion WDL  Danger to Self  Current suicidal ideation? Denies  Self-Injurious Behavior No self-injurious ideation or behavior indicators observed or expressed   Agreement Not to Harm Self Yes  Description of Agreement verbal  Danger to Others  Danger to Others None reported or observed

## 2024-02-27 NOTE — Group Note (Signed)
 Date:  02/27/2024 Time:  10:34 AM  Group Topic/Focus:  Relapse Prevention Planning:   The focus of this group is to define relapse and discuss the need for planning to combat relapse.    Participation Level:  Active  Participation Quality:  Appropriate  Affect:  Appropriate  Cognitive:  Appropriate  Insight: Appropriate  Engagement in Group:  Engaged  Modes of Intervention:  Activity  Additional Comments:    [name] 02/27/2024, 10:34 AM

## 2024-02-28 ENCOUNTER — Other Ambulatory Visit: Payer: Self-pay

## 2024-02-28 MED ORDER — NICOTINE 14 MG/24HR TD PT24
14.0000 mg | MEDICATED_PATCH | Freq: Every day | TRANSDERMAL | 0 refills | Status: DC
  Filled 2024-02-28: qty 28, 28d supply, fill #0

## 2024-02-28 MED ORDER — ENSURE PLUS HIGH PROTEIN PO LIQD
237.0000 mL | Freq: Two times a day (BID) | ORAL | 0 refills | Status: DC
  Filled 2024-02-28: qty 14220, 30d supply, fill #0

## 2024-02-28 MED ORDER — DULOXETINE HCL 30 MG PO CPEP
30.0000 mg | ORAL_CAPSULE | Freq: Two times a day (BID) | ORAL | 0 refills | Status: DC
  Filled 2024-02-28: qty 60, 30d supply, fill #0

## 2024-02-28 MED ORDER — ADULT MULTIVITAMIN W/MINERALS CH
1.0000 | ORAL_TABLET | Freq: Every day | ORAL | 0 refills | Status: AC
  Filled 2024-02-28: qty 30, 30d supply, fill #0

## 2024-02-28 NOTE — Discharge Summary (Signed)
 Physician Discharge Summary Note  Patient:  [name] is an 53 y.o., male MRN:  [id number] DOB:  December 25, 1970 Patient phone:  [phone] (home)  Patient address:   [address] [place] [address],   Total time spent: 40 min Date of Admission:  02/23/2024 Date of Discharge: 02/28/24  Reason for Admission:  [name] is a 9 y.o. year-old male with a history of recurrent depression and anxiety for multiple years, which has only increased in intensity over the past few years because of multiple family losses, as well as chronic financial issues. He presented to the ED with SI with no plan. Patient is admitted to adult psych unit with Q15 min safety monitoring. Multidisciplinary team approach is offered. Medication management; group/milieu therapy is offered.   Principal Problem: MDD (major depressive disorder) Discharge Diagnoses: Principal Problem:   MDD (major depressive disorder) Active Problems:   Right leg pain   Sinus bradycardia   Chest pain   Underweight (BMI < 18.5)   History of sepsis secondary pneumonia (02/17/2024)   Finding of multiple premature atrial contractions by electrocardiography   History of substance abuse (HCC)   Chronic anemia   MDD (major depressive disorder), recurrent episode, moderate (HCC)   Past Psychiatric History: see h&p  Family Psychiatric  History: see h&p Social History:  Social History   Substance and Sexual Activity  Alcohol  Use No     Social History   Substance and Sexual Activity  Drug Use Yes   Types: Marijuana    Social History   Socioeconomic History   Marital status: Single    Spouse name: Not on file   Number of children: Not on file   Years of education: Not on file   Highest education level: Not on file  Occupational History   Not on file  Tobacco Use   Smoking status: Every Day    Types: Cigarettes   Smokeless tobacco: Never  Vaping Use   Vaping status: Never Used  Substance and Sexual  Activity   Alcohol  use: No   Drug use: Yes    Types: Marijuana   Sexual activity: Yes    Birth control/protection: Condom  Other Topics Concern   Not on file  Social History Narrative   Not on file   Social Drivers of Health   Financial Resource Strain: High Risk (03/19/2019)   Overall Financial Resource Strain (CARDIA)    Difficulty of Paying Living Expenses: Very hard  Food Insecurity: Food Insecurity Present (02/23/2024)   Hunger Vital Sign    Worried About Running Out of Food in the Last Year: Often true    Ran Out of Food in the Last Year: Often true  Transportation Needs: Unmet Transportation Needs (02/23/2024)   PRAPARE - Administrator, Civil Service (Medical): Yes    Lack of Transportation (Non-Medical): Yes  Physical Activity: Not on file  Stress: Not on file  Social Connections: Patient Declined (06/30/2023)   Social Connection and Isolation Panel    Frequency of Communication with Friends and Family: Patient declined    Frequency of Social Gatherings with Friends and Family: Patient declined    Attends Religious Services: Patient declined    Database Administrator or Organizations: Patient declined    Attends Banker Meetings: Patient declined    Marital Status: Patient declined   Past Medical History:  Past Medical History:  Diagnosis Date   Alcohol  abuse    Anemia    Colitis  Depression    Severe major depression with psychotic features (HCC)    History reviewed. No pertinent surgical history. Family History:  Family History  Problem Relation Age of Onset   Hypertension Mother    Hypertension Father    Hypertension Other     Hospital Course:  [name] is a 52 y.o. year-old male with a history of recurrent depression and anxiety for multiple years, which has only increased in intensity over the past few years because of multiple family losses, as well as chronic financial issues. He presented to the ED with SI with no  plan. Patient is admitted to adult psych unit with Q15 min safety monitoring. Multidisciplinary team approach is offered. Medication management; group/milieu therapy is offered.  Detailed risk assessment is complete based on clinical exam and individual risk factors and acute suicide risk is low and acute violence risk is low.     On admission patient was started on Cymbalta  20 mg twice daily to help with the depression and chronic pain.  Patient tolerated the medications very well and the dosage was optimized to Cymbalta  30 mg twice daily.  Patient mood improved significantly on the unit.  He participated in groups and displayed safe behaviors.  On the day of discharge he denies SI/HI/plan and denied hallucinations.  He remains future oriented and is willing to participate in outpatient mental health services. Currently, all modifiable risk of harm to self/harm to others have been addressed and patient is no longer appropriate for the acute inpatient setting and is able to continue treatment for mental health needs in the community with the supports as indicated below.  Patient is educated and verbalized understanding of discharge plan of care including medications, follow-up appointments, mental health resources and further crisis services in the community.  He is instructed to call 911 or present to the nearest emergency room should he experience any decompensation in mood, disturbance of bowel or return of suicidal/homicidal ideations.  Patient verbalizes understanding of this education and agrees to this plan of care  Physical Findings: AIMS:  , ,  ,  ,    CIWA:    COWS:        Psychiatric Specialty Exam:  Presentation  General Appearance:  Appropriate for Environment; Casual  Eye Contact: Fair  Speech: Clear and Coherent  Speech Volume: Normal    Mood and Affect  Mood: Euthymic  Affect: Appropriate   Thought Process  Thought Processes: Coherent  Descriptions of  Associations:Intact  Orientation:Full (Time, Place and Person)  Thought Content:Logical  Hallucinations:Hallucinations: None  Ideas of Reference:None  Suicidal Thoughts:Suicidal Thoughts: No  Homicidal Thoughts:Homicidal Thoughts: No   Sensorium  Memory: Immediate Fair; Remote Fair  Judgment: Fair  Insight: Fair   Art Therapist  Concentration: Fair  Attention Span: Fair  Recall: Fair  Fund of Knowledge: Fair  Language: Fair   Psychomotor Activity  Psychomotor Activity: Psychomotor Activity: Normal  Musculoskeletal: Strength & Muscle Tone: within normal limits Gait & Station: normal Assets  Assets: Manufacturing Systems Engineer; Desire for Improvement; Social Support   Sleep  Sleep: Sleep: Fair    Physical Exam: Physical Exam Vitals and nursing note reviewed.    ROS Blood pressure 117/63, pulse 79, temperature 98.4 F (36.9 C), temperature source Oral, resp. rate 16, height 5' 7 (1.702 m), weight 51.7 kg, SpO2 100%. Body mass index is 17.85 kg/m.   Social History   Tobacco Use  Smoking Status Every Day   Types: Cigarettes  Smokeless Tobacco Never  Tobacco Cessation:  A prescription for an FDA-approved tobacco cessation medication provided at discharge   Blood Alcohol  level:  Lab Results  Component Value Date   [facility] <15 02/22/2024   [facility] <15 11/23/2023    Metabolic Disorder Labs:  Lab Results  Component Value Date   HGBA1C 4.6 (L) 02/27/2024   MPG 85.32 02/27/2024   MPG 103 08/27/2014   No results found for: PROLACTIN Lab Results  Component Value Date   CHOL 201 (H) 02/27/2024   TRIG 76 02/27/2024   HDL 75 02/27/2024   CHOLHDL 2.7 02/27/2024   VLDL 15 02/27/2024   LDLCALC 111 (H) 02/27/2024    See Psychiatric Specialty Exam and Suicide Risk Assessment completed by Attending Physician prior to discharge.  Discharge destination:  Home  Is patient on multiple antipsychotic therapies at discharge:  No   Has  Patient had three or more failed trials of antipsychotic monotherapy by history:  No  Recommended Plan for Multiple Antipsychotic Therapies: NA  Discharge Instructions     Diet - low sodium heart healthy   Complete by: As directed    Increase activity slowly   Complete by: As directed       Allergies as of 02/28/2024       Reactions   Aspirin Hives, Nausea And Vomiting   Ibuprofen  Hives, Nausea And Vomiting   Porcine (pork) Protein-containing Drug Products Other (See Comments)   Stomach pain. Pt does not eat pork   Acetaminophen  Nausea Only   Sertraline  Diarrhea, Other (See Comments)   Acute GI distress with diarrhea and pain.         Medication List     STOP taking these medications    nicotine  polacrilex 2 MG gum Commonly known as: NICORETTE        TAKE these medications      Indication  DULoxetine  30 MG capsule Commonly known as: CYMBALTA  Take 1 capsule (30 mg total) by mouth 2 (two) times daily.  Indication: Major Depressive Disorder   feeding supplement Liqd Take 237 mLs by mouth 2 (two) times daily between meals.  Indication: Weight Loss   multivitamin with minerals Tabs tablet Take 1 tablet by mouth daily. Start taking on: February 29, 2024  Indication: Weight Loss   nicotine  14 mg/24hr patch Commonly known as: NICODERM CQ  - dosed in mg/24 hours Place 1 patch (14 mg total) onto the skin daily. Start taking on: February 29, 2024  Indication: Nicotine  Addiction   ondansetron  4 MG tablet Commonly known as: ZOFRAN  Take 1 tablet (4 mg total) by mouth every 6 (six) hours as needed for nausea.  Indication: Nausea and Vomiting        Follow-up Information     [facility] Follow up.   Why: In person primary care appointment is 04/12/14 at 11 AM with Reyne RONAL Bustle, MD. Contact information: [address] [place],  [place]  [address]  Main: [phone]        [facility] Follow up.    Specialty: Behavioral Health Why: Your appointment is scheduled for 03/21/24 at 10:30 AM. Contact information: [address] [place]  [address] [phone]                Follow-up recommendations:  Activity:  As tolerated    Signed: Jaskirat Schwieger, MD 02/28/2024, 10:38 AM

## 2024-02-28 NOTE — Progress Notes (Signed)
  [facility] Adult Case Management Discharge Plan :  Will you be returning to the same living situation after discharge:  Yes,  pt plans to return home upon discharge.  At discharge, do you have transportation home?: Yes,  pt received taxi voucher.  Do you have the ability to pay for your medications: Yes,  [employer]   Release of information consent forms completed and in the chart;  Patient's signature needed at discharge.  Patient to Follow up at:  Follow-up Information     [facility] Follow up.   Why: In person primary care appointment is 04/12/14 at 11 AM with Reyne RONAL Bustle, MD. Contact information: [address] [place],  [place]  [address]  Main: [phone]        [facility] Follow up.   Specialty: Behavioral Health Why: Your appointment is scheduled for 03/21/24 at 10:30 AM. Contact information: [address] [facility] [place]  [address] [phone]                Next level of care provider has access to [facility] Link:no  Safety Planning and Suicide Prevention discussed: Yes,  SPE completed with daughter, Zakiah Tumolo.     Has patient been referred to the Quitline?: Yes, faxed/e-referral on 02/28/24.  Patient has been referred for addiction treatment: Patient refused referral for treatment.  Nadara JONELLE Fam, LCSW 02/28/2024, 12:47 PM

## 2024-02-28 NOTE — Group Note (Signed)
 Date:  02/28/2024 Time:  10:37 AM  Group Topic/Focus:  Healthy Communication:   The focus of this group is to discuss communication, barriers to communication, as well as healthy ways to communicate with others.    Participation Level:  Active  Participation Quality:  Appropriate  Affect:  Appropriate  Cognitive:  Appropriate  Insight: Appropriate  Engagement in Group:  Engaged  Modes of Intervention:  Activity  Additional Comments:    [name] 02/28/2024, 10:37 AM

## 2024-02-28 NOTE — Group Note (Signed)
 Recreation Therapy Group Note   Group Topic:Healthy Support Systems  Group Date: 02/28/2024 Start Time: 1030 End Time: 1130 Facilitators: Celestia Jeoffrey BRAVO, LRT, CTRS Location: Craft Room  Group Description: Straw Bridge. In groups or individually, patients were given 10 plastic drinking straws and an equal length of masking tape. Using the materials provided, patients were instructed to build a free-standing bridge-like structure to suspend an everyday item (ex: deck of cards) off the floor or table surface. All materials were required to be used in secondary school teacher. LRT facilitated post-activity discussion reviewing the importance of having strong and healthy support systems in our lives. LRT discussed how the people in our lives serve as the tape and the deck of cards we placed on top of our straw structure are the stressors we face in daily life. LRT and pts discussed what happens in our life when things get too heavy for us , and we don't have strong supports outside of the hospital. Pt shared 2 of their healthy supports in their life aloud in the group.   Goal Area(s) Addressed:  Patient will identify 2 healthy supports in their life. Patient will identify skills to successfully complete activity. Patient will identify correlation of this activity to life post-discharge.  Patient will build on frustration tolerance skills. Patient will increase team building and communication skills.    Affect/Mood: N/A   Participation Level: Did not attend    Clinical Observations/Individualized Feedback: Patient did not attend group.   Plan: Continue to engage patient in RT group sessions 2-3x/week.   Jeoffrey BRAVO Celestia, LRT, CTRS 02/28/2024 1:11 PM

## 2024-02-28 NOTE — Plan of Care (Signed)
   Problem: Education: Goal: Knowledge of General Education information will improve Description Including pain rating scale, medication(s)/side effects and non-pharmacologic comfort measures Outcome: Progressing   Problem: Health Behavior/Discharge Planning: Goal: Ability to manage health-related needs will improve Outcome: Progressing

## 2024-02-28 NOTE — Progress Notes (Signed)
 Patient denies SI/I/AVH at this time. Discharge instructions, AVS, prescriptions, and transition record reviewed with patient. Patient agrees to comply with medication management, follow-up visit and outpatient therapy. Patient belongings returned to patient. Patient questions and concerns addressed and answered. Patient ambulatory off unit. Patient discharged to home via taxi.

## 2024-02-28 NOTE — BHH Suicide Risk Assessment (Signed)
 [facility] Discharge Suicide Risk Assessment   Principal Problem: MDD (major depressive disorder) Discharge Diagnoses: Principal Problem:   MDD (major depressive disorder) Active Problems:   Right leg pain   Sinus bradycardia   Chest pain   Underweight (BMI < 18.5)   History of sepsis secondary pneumonia (02/17/2024)   Finding of multiple premature atrial contractions by electrocardiography   History of substance abuse (HCC)   Chronic anemia   MDD (major depressive disorder), recurrent episode, moderate (HCC)   Total Time spent with patient: 30 minutes  Musculoskeletal: Strength & Muscle Tone: within normal limits Gait & Station: normal Patient leans: N/A  Psychiatric Specialty Exam  Presentation  General Appearance:  Appropriate for Environment; Casual  Eye Contact: Fair  Speech: Clear and Coherent  Speech Volume: Normal  Handedness: Right   Mood and Affect  Mood: Euthymic  Duration of Depression Symptoms: Less than two weeks  Affect: Appropriate   Thought Process  Thought Processes: Coherent  Descriptions of Associations:Intact  Orientation:Full (Time, Place and Person)  Thought Content:Logical  History of Schizophrenia/Schizoaffective disorder:No  Duration of Psychotic Symptoms:N/A  Hallucinations:Hallucinations: None  Ideas of Reference:None  Suicidal Thoughts:Suicidal Thoughts: No  Homicidal Thoughts:Homicidal Thoughts: No   Sensorium  Memory: Immediate Fair; Remote Fair  Judgment: Fair  Insight: Fair   Art Therapist  Concentration: Fair  Attention Span: Fair  Recall: [employer] of Knowledge: Fair  Language: Fair   Psychomotor Activity  Psychomotor Activity: Psychomotor Activity: Normal   Assets  Assets: Communication Skills; Desire for Improvement; Social Support   Sleep  Sleep: Sleep: Fair  Estimated Sleeping Duration (Last 24 Hours): 7.50-9.50 hours  Physical Exam: Physical  Exam ROS Blood pressure 117/63, pulse 79, temperature 98.4 F (36.9 C), temperature source Oral, resp. rate 16, height 5' 7 (1.702 m), weight 51.7 kg, SpO2 100%. Body mass index is 17.85 kg/m.  Mental Status Per Nursing Assessment::   On Admission:  Suicidal ideation indicated by patient, Self-harm thoughts, Belief that plan would result in death  Demographic Factors:  Male  Loss Factors: Decrease in vocational status  Historical Factors: Impulsivity  Risk Reduction Factors:   Living with another person, especially a relative, Positive social support, Positive therapeutic relationship, and Positive coping skills or problem solving skills  Continued Clinical Symptoms:  Depression:   Comorbid alcohol  abuse/dependence  Cognitive Features That Contribute To Risk:  None    Suicide Risk:  Minimal: No identifiable suicidal ideation.  Patients presenting with no risk factors but with morbid ruminations; may be classified as minimal risk based on the severity of the depressive symptoms   Follow-up Information     [facility] Follow up.   Why: In person primary care appointment is 04/12/14 at 11 AM with Reyne RONAL Bustle, MD. Contact information: [address] [place],  [place]  [address]  Main: [phone]        [facility] Follow up.   Specialty: Behavioral Health Why: Your appointment is scheduled for 03/21/24 at 10:30 AM. Contact information: [address] [place]  [address] [phone]                Plan Of Care/Follow-up recommendations:  Activity:  As tolerated  Allyn Foil, MD 02/28/2024, 10:37 AM

## 2024-02-28 NOTE — Progress Notes (Signed)
   02/28/24 0916  Psych Admission Type (Psych Patients Only)  Admission Status Voluntary  Psychosocial Assessment  Patient Complaints None  Eye Contact Fair  Facial Expression Animated  Affect Appropriate to circumstance  Speech Logical/coherent  Interaction Assertive  Motor Activity Slow  Appearance/Hygiene Unremarkable  Behavior Characteristics Cooperative;Guarded  Mood Pleasant  Aggressive Behavior  Effect No apparent injury  Thought Process  Coherency WDL  Content WDL  Delusions None reported or observed  Perception WDL  Hallucination None reported or observed  Judgment WDL  Confusion WDL  Danger to Self  Current suicidal ideation? Denies  Self-Injurious Behavior No self-injurious ideation or behavior indicators observed or expressed   Agreement Not to Harm Self Yes  Description of Agreement verbal  Danger to Others  Danger to Others None reported or observed

## 2024-03-03 ENCOUNTER — Telehealth (HOSPITAL_COMMUNITY): Payer: MEDICAID | Admitting: Physician Assistant

## 2024-03-03 ENCOUNTER — Encounter (HOSPITAL_COMMUNITY): Payer: Self-pay

## 2024-03-07 ENCOUNTER — Ambulatory Visit (INDEPENDENT_AMBULATORY_CARE_PROVIDER_SITE_OTHER): Payer: MEDICAID | Admitting: Physician Assistant

## 2024-03-07 ENCOUNTER — Encounter (HOSPITAL_COMMUNITY): Payer: Self-pay | Admitting: Physician Assistant

## 2024-03-07 VITALS — BP 143/65 | HR 79 | Temp 98.2°F | Ht 67.0 in | Wt 124.6 lb

## 2024-03-07 DIAGNOSIS — F411 Generalized anxiety disorder: Secondary | ICD-10-CM | POA: Insufficient documentation

## 2024-03-07 DIAGNOSIS — F332 Major depressive disorder, recurrent severe without psychotic features: Secondary | ICD-10-CM | POA: Diagnosis not present

## 2024-03-07 MED ORDER — DULOXETINE HCL 30 MG PO CPEP: 30.0000 mg | ORAL_CAPSULE | Freq: Two times a day (BID) | ORAL | 1 refills | Status: DC

## 2024-03-07 NOTE — Progress Notes (Signed)
 BH MD/PA/NP OP Progress Note  03/07/2024 6:30 PM [name]  MRN:  [id number]  Chief Complaint:  Chief Complaint  Patient presents with   Establish Care   Medication Refill   HPI:   [name] is a 53 year old male with a past psychiatric history significant for major depressive disorder (recurrent, severe, without psychosis) who presents to [facility] for follow-up and medication management.  Patient was last seen by this provider on 04/13/2023.  During his last encounter, patient was being managed on the following psychiatric medication: Mirtazapine  15 mg at bedtime.  Patient was recently discharged from [facility].  Per chart review, patient was admitted to [facility] on 02/23/2024 after presenting to the ED with suicidal ideations with no plan in the setting of worsening depression.  Patient's suicidal ideations were a direct result of ongoing depression that had increased in intensity over the past few years due to multiple family losses as well as chronic financial issues.  Patient was discharged on 02/28/2024 on the following psychiatric medication: Duloxetine  30 mg 2 times daily.  Today, patient presents to the encounter stating that his diet has been good; however, he is lacking food due to financial instability.  He reports that he has been struggling with getting food regularly and states that he continues to lose weight due to limited food intake.  He reports that he was recently denied food stamps by DSS.  Patient reports that he has no other food or source of income at this time.  Patient reports that he is currently on section 8 housing but states that he has to come up with the cost for utilities a lot of the time.  Patient endorses depression due to his generalized stressors.  He also endorses lack of transportation stating that he had to walk to this facility to make it to his appointment on time.  He reports  that he still grieves over his daughter who passed away due to COVID a few years back.  He endorses social support through his youngest daughter but states that he is overall tired.  Due to patient's emotional state, provider was unable to perform a PHQ-9 or GAD-7 screen on the patient.  Patient is alert and oriented x 4, tearful yet cooperative, and fully engaged in conversation during the encounter.  Patient describes his mood as depressed.  Patient exhibits depressed mood with congruent affect.  Patient is uncertain on whether or not he is suicidal but denies having intent or plan at this time.  Patient denies homicidal ideations.  He further denies auditory or visual hallucinations and does not appear to be responding to internal/external stimuli.  Patient endorses poor sleep stating that he tosses and turns a lot due to his back pain.  Patient endorses poor appetite and states that he is not able to eat every day.  Patient denies alcohol  consumption or illicit drug use.  Patient endorses tobacco use stating that he smokes a couple cigarettes per day.  Visit Diagnosis:    ICD-10-CM   1. MDD (major depressive disorder), recurrent severe, without psychosis (HCC)  F33.2 DULoxetine  (CYMBALTA ) 30 MG capsule      Past Psychiatric History:  Patient has a past psychiatric history significant for major depressive disorder (recurrent severe, without psychosis)   Patient has a past history of hospitalization due to mental health.  Patient was recently discharged from [facility] on 02/28/2024.            -  Patient has been hospitalized at [facility] on at least 7 different occasions   Patient denies a past history of suicide attempt   Patient denies a past history of homicide attempt  Past Medical History:  Past Medical History:  Diagnosis Date   Alcohol  abuse    Anemia    Colitis    Depression    Severe major depression with psychotic features (HCC)    History reviewed. No  pertinent surgical history.  Family Psychiatric History:  Patient denies a family history of mental illness   Family history of suicide attempt: Patient denies Family history of homicide attempt: Patient denies Family history of substance abuse: Patient reports that his mother was an alcoholic and passed away due to cirrhosis of the liver.  Patient reports that his father also has legal problems as well.  He reports that there have been a lot of drinkers in his family.  Family History:  Family History  Problem Relation Age of Onset   Hypertension Mother    Hypertension Father    Hypertension Other     Social History:  Social History   Socioeconomic History   Marital status: Single    Spouse name: Not on file   Number of children: Not on file   Years of education: Not on file   Highest education level: Not on file  Occupational History   Not on file  Tobacco Use   Smoking status: Every Day    Types: Cigarettes   Smokeless tobacco: Never  Vaping Use   Vaping status: Never Used  Substance and Sexual Activity   Alcohol  use: No   Drug use: Yes    Types: Marijuana   Sexual activity: Yes    Birth control/protection: Condom  Other Topics Concern   Not on file  Social History Narrative   Not on file   Social Drivers of Health   Financial Resource Strain: High Risk (03/19/2019)   Overall Financial Resource Strain (CARDIA)    Difficulty of Paying Living Expenses: Very hard  Food Insecurity: Food Insecurity Present (02/23/2024)   Hunger Vital Sign    Worried About Running Out of Food in the Last Year: Often true    Ran Out of Food in the Last Year: Often true  Transportation Needs: Unmet Transportation Needs (02/23/2024)   PRAPARE - Administrator, Civil Service (Medical): Yes    Lack of Transportation (Non-Medical): Yes  Physical Activity: Not on file  Stress: Not on file  Social Connections: Patient Declined (06/30/2023)   Social Connection and Isolation  Panel    Frequency of Communication with Friends and Family: Patient declined    Frequency of Social Gatherings with Friends and Family: Patient declined    Attends Religious Services: Patient declined    Database Administrator or Organizations: Patient declined    Attends Banker Meetings: Patient declined    Marital Status: Patient declined    Allergies:  Allergies  Allergen Reactions   Aspirin Hives and Nausea And Vomiting   Ibuprofen  Hives and Nausea And Vomiting   Porcine (Pork) Protein-Containing Drug Products Other (See Comments)    Stomach pain. Pt does not eat pork   Acetaminophen  Nausea Only   Sertraline  Diarrhea and Other (See Comments)    Acute GI distress with diarrhea and pain.     Metabolic Disorder Labs: Lab Results  Component Value Date   HGBA1C 4.6 (L) 02/27/2024   MPG 85.32 02/27/2024   MPG 103 08/27/2014  No results found for: PROLACTIN Lab Results  Component Value Date   CHOL 201 (H) 02/27/2024   TRIG 76 02/27/2024   HDL 75 02/27/2024   CHOLHDL 2.7 02/27/2024   VLDL 15 02/27/2024   LDLCALC 111 (H) 02/27/2024   Lab Results  Component Value Date   TSH 1.477 02/23/2024   TSH 1.151 11/26/2023    Therapeutic Level Labs: No results found for: LITHIUM No results found for: VALPROATE No results found for: CBMZ  Current Medications: Current Outpatient Medications  Medication Sig Dispense Refill   DULoxetine  (CYMBALTA ) 30 MG capsule Take 1 capsule (30 mg total) by mouth 2 (two) times daily. 60 capsule 1   feeding supplement (ENSURE PLUS HIGH PROTEIN) LIQD Take 237 mLs by mouth 2 (two) times daily between meals. 14220 mL 0   Multiple Vitamin (MULTIVITAMIN WITH MINERALS) TABS tablet Take 1 tablet by mouth daily. 30 tablet 0   nicotine  (NICODERM CQ  - DOSED IN MG/24 HOURS) 14 mg/24hr patch Place 1 patch (14 mg total) onto the skin daily. 28 patch 0   ondansetron  (ZOFRAN ) 4 MG tablet Take 1 tablet (4 mg total) by mouth every 6 (six)  hours as needed for nausea. 20 tablet 0   No current facility-administered medications for this visit.     Musculoskeletal: Strength & Muscle Tone: within normal limits Gait & Station: normal Patient leans: N/A  Psychiatric Specialty Exam: Review of Systems  Psychiatric/Behavioral:  Positive for dysphoric mood and sleep disturbance. Negative for decreased concentration, hallucinations, self-injury and suicidal ideas. The patient is nervous/anxious. The patient is not hyperactive.     Blood pressure (!) 143/65, pulse 79, temperature 98.2 F (36.8 C), temperature source Oral, height 5' 7 (1.702 m), weight 124 lb 9.6 oz (56.5 kg), SpO2 100%.Body mass index is 19.52 kg/m.  General Appearance: Casual  Eye Contact:  Good  Speech:  Clear and Coherent and Normal Rate  Volume:  Normal  Mood:  Anxious and Depressed  Affect:  Congruent and Tearful  Thought Process:  Coherent, Goal Directed, and Descriptions of Associations: Intact  Orientation:  Full (Time, Place, and Person)  Thought Content: WDL   Suicidal Thoughts:  No  Homicidal Thoughts:  No  Memory:  Immediate;   Good Recent;   Good Remote;   Fair  Judgement:  Good  Insight:  Good  Psychomotor Activity:  Normal  Concentration:  Concentration: Good and Attention Span: Good  Recall:  Good  Fund of Knowledge: Good  Language: Good  Akathisia:  No  Handed:  Right  AIMS (if indicated): not done  Assets:  Communication Skills Desire for Improvement Housing  ADL's:  Impaired  Cognition: WNL  Sleep:  Fair   Screenings: AIMS    Flowsheet Row [facility]  AIMS Total Score 0   AUDIT    Flowsheet Row Admission (Discharged) from [facility]/23/2024 in [facility] Ad[facility] ([facility]) [facility] (Dis[facility]  [facility]) [facility] Admission (Discharged) from 02/18/2023 in [facility]PATIENT BE[facility] MEDICINE  Alcohol  Use Disorder Identification Test Final Score (AUDIT) 0 4 0 0 0   GAD-7    Flowsheet Row Office Visit from 04/13/2023 in [facility] Office Visit from 10/14/2021 in [facility]  Total GAD-7 Score 16 9   PHQ2-9  Flowsheet Row Office Visit from 04/13/2023 in [facility] Office Visit from 10/14/2021 in [facility] ED from 09/28/2021 in [facility] Emergency Department at [facility] Office Visit from 09/03/2014 in [facility] [place]  PHQ-2 Total Score 4 4 5  0  PHQ-9 Total Score 11 14 19  --   Flowsheet Row Clinical Support from 03/07/2024 in [facility] Admission (Discharged) from 02/23/2024 in [facility] INPATIENT BEHAVIORAL MEDICINE ED from 02/22/2024 in [facility] Emergency Department at [facility]  C-SSRS RISK CATEGORY High Risk Moderate Risk High Risk     Assessment and Plan:   [name] is a 52 year old male with a past psychiatric history significant for major depressive disorder (recurrent, severe, without psychosis) who presents to [facility] for follow-up and medication management.  Patient was last seen by this provider on 04/13/2023.   Per chart review, patient was admitted to [facility] on 02/23/2024 after presenting to the ED with suicidal ideations with no plan in the setting of worsening depression.  Patient's suicidal ideations were a direct result of ongoing depression that had increased in intensity over the past few years due to multiple family losses as well as chronic financial issues.  Patient was discharged on 02/28/2024 on the following psychiatric  medication: Duloxetine  30 mg 2 times daily.  Since being discharged from [facility], patient reports that he has not been taking his medications due to the capsules being different from the ones he took while in the hospital.  Provider explained to patient that medication capsules can differ based on brand-name or generic as well as what manufacturer produce the medication.  Patient vocalized understanding and stated that he would start taking his medication following the conclusion of the encounter.  Patient notes that he has been experiencing hunger due to lack of food and financial instability.  He reports that he struggles with getting food regularly and that he has been losing significant weight.  He reports that he was recently denied food stamps by DSS.  Patient also expresses stressors related to coming up with a cost of his utilities for his section 8 housing.  Patient also continues to grieve over the passing of his eldest daughter.  During the assessment, patient exhibited depressed mood as well as anxiety attributed to stressors in his life.  A PHQ-9 screen nor a GAD-7 screening were able to be performed due to patient's emotional state at the time of the encounter.  Provider informed patient that resources would be looked into to help his situation.  Patient vocalized understanding.  Patient was agreeable to continue taking his duloxetine  following the conclusion of the encounter.  Patient's medication to be e-prescribed to pharmacy of choice.  A [place] Suicide Severity Rating Scale was performed with the patient being considered high risk.  Patient denies suicidal ideations and is able to contract for safety at this time.  Safety planning was discussed with the patient prior to the conclusion of the encounter.  - Patient was instructed to contact 911 in the event of a mental health crisis. - Patient was instructed to contact 988 Suicide and Crisis Lifeline in the event of a mental health  crisis. - Patient was instructed to present to [facility] in the event of a mental health crisis.  Collaboration of Care: Collaboration of Care: Medication Management AEB provider  managing patient's psychiatric medication, Primary Care Provider AEB patient being followed by a family medicine provider, and Psychiatrist AEB patient being followed by a mental health provider at this facility.  Patient/Guardian was advised Release of Information must be obtained prior to any record release in order to collaborate their care with an outside provider. Patient/Guardian was advised if they have not already done so to contact the registration department to sign all necessary forms in order for us  to release information regarding their care.   Consent: Patient/Guardian gives verbal consent for treatment and assignment of benefits for services provided during this visit. Patient/Guardian expressed understanding and agreed to proceed.   1. MDD (major depressive disorder), recurrent severe, without psychosis (HCC) (Primary)  - DULoxetine  (CYMBALTA ) 30 MG capsule; Take 1 capsule (30 mg total) by mouth 2 (two) times daily.  Dispense: 60 capsule; Refill: 1  Patient to follow up in 6 weeks Provider spent a total of 44 minutes with the patient/reviewing the patient's chart  Reginia FORBES Bolster, PA 03/07/2024, 6:30 PM

## 2024-03-21 ENCOUNTER — Encounter (HOSPITAL_COMMUNITY): Payer: MEDICAID | Admitting: Physician Assistant

## 2024-04-12 ENCOUNTER — Ambulatory Visit: Payer: MEDICAID | Admitting: Family Medicine

## 2024-04-18 ENCOUNTER — Other Ambulatory Visit: Payer: Self-pay

## 2024-04-18 ENCOUNTER — Encounter (HOSPITAL_COMMUNITY): Payer: MEDICAID | Admitting: Physician Assistant

## 2024-04-18 ENCOUNTER — Emergency Department (HOSPITAL_COMMUNITY)
Admission: EM | Admit: 2024-04-18 | Discharge: 2024-04-19 | Disposition: A | Payer: MEDICAID | Attending: Emergency Medicine | Admitting: Emergency Medicine

## 2024-04-18 ENCOUNTER — Encounter (HOSPITAL_COMMUNITY): Payer: Self-pay

## 2024-04-18 DIAGNOSIS — F329 Major depressive disorder, single episode, unspecified: Secondary | ICD-10-CM | POA: Diagnosis present

## 2024-04-18 DIAGNOSIS — I10 Essential (primary) hypertension: Secondary | ICD-10-CM | POA: Diagnosis not present

## 2024-04-18 DIAGNOSIS — Z720 Tobacco use: Secondary | ICD-10-CM | POA: Diagnosis not present

## 2024-04-18 DIAGNOSIS — R45851 Suicidal ideations: Secondary | ICD-10-CM | POA: Insufficient documentation

## 2024-04-18 LAB — COMPREHENSIVE METABOLIC PANEL WITH GFR
ALT: 7 U/L (ref 0–44)
AST: 17 U/L (ref 15–41)
Albumin: 4.3 g/dL (ref 3.5–5.0)
Alkaline Phosphatase: 51 U/L (ref 38–126)
Anion gap: 10 (ref 5–15)
BUN: 22 mg/dL — ABNORMAL HIGH (ref 6–20)
CO2: 27 mmol/L (ref 22–32)
Calcium: 9.9 mg/dL (ref 8.9–10.3)
Chloride: 102 mmol/L (ref 98–111)
Creatinine, Ser: 1.14 mg/dL (ref 0.61–1.24)
GFR, Estimated: >60 mL/min (ref >60)
Glucose, Bld: 103 mg/dL — ABNORMAL HIGH (ref 70–99)
Potassium: 4.1 mmol/L (ref 3.5–5.1)
Sodium: 139 mmol/L (ref 135–145)
Total Bilirubin: 0.3 mg/dL (ref 0.0–1.2)
Total Protein: 7.6 g/dL (ref 6.5–8.1)

## 2024-04-18 LAB — CBC WITH DIFFERENTIAL/PLATELET
Abs Immature Granulocytes: 0.03 K/uL (ref 0.00–0.07)
Basophils Absolute: 0 K/uL (ref 0.0–0.1)
Basophils Relative: 0 %
Eosinophils Absolute: 0.4 K/uL (ref 0.0–0.5)
Eosinophils Relative: 4 %
HCT: 38.4 % — ABNORMAL LOW (ref 39.0–52.0)
Hemoglobin: 12.4 g/dL — ABNORMAL LOW (ref 13.0–17.0)
Immature Granulocytes: 0 %
Lymphocytes Relative: 31 %
Lymphs Abs: 3.3 K/uL (ref 0.7–4.0)
MCH: 30.8 pg (ref 26.0–34.0)
MCHC: 32.3 g/dL (ref 30.0–36.0)
MCV: 95.3 fL (ref 80.0–100.0)
Monocytes Absolute: 0.9 K/uL (ref 0.1–1.0)
Monocytes Relative: 9 %
Neutro Abs: 5.8 K/uL (ref 1.7–7.7)
Neutrophils Relative %: 56 %
Platelets: 240 K/uL (ref 150–400)
RBC: 4.03 MIL/uL — ABNORMAL LOW (ref 4.22–5.81)
RDW: 13.5 % (ref 11.5–15.5)
WBC: 10.6 K/uL — ABNORMAL HIGH (ref 4.0–10.5)
nRBC: 0 % (ref 0.0–0.2)

## 2024-04-18 LAB — ETHANOL: Alcohol, Ethyl (B): <15 mg/dL (ref <15)

## 2024-04-18 NOTE — ED Notes (Signed)
 Belongings: one blk cell phone in red bag, tan pants, white pants, blk pants, green jacket, blk hat, blk sweatshirt, red bag. 4 PT bags

## 2024-04-18 NOTE — ED Triage Notes (Addendum)
 Pt BIB EMS with reports of SI. Pt states that he has been dealing with this off and on for years. If he was to do anything, he states that he would drink. Pt reports chronic pain in his legs and back.

## 2024-04-19 ENCOUNTER — Encounter: Payer: Self-pay | Admitting: Psychiatry

## 2024-04-19 ENCOUNTER — Inpatient Hospital Stay
Admission: AD | Admit: 2024-04-19 | Discharge: 2024-04-24 | DRG: 885 | Disposition: A | Discharge status: Home / Self Care | Payer: MEDICAID | Source: Intra-hospital | Attending: Psychiatry | Admitting: Psychiatry

## 2024-04-19 DIAGNOSIS — F419 Anxiety disorder, unspecified: Secondary | ICD-10-CM | POA: Diagnosis present

## 2024-04-19 DIAGNOSIS — Z5989 Other problems related to housing and economic circumstances: Secondary | ICD-10-CM | POA: Diagnosis not present

## 2024-04-19 DIAGNOSIS — F17210 Nicotine dependence, cigarettes, uncomplicated: Secondary | ICD-10-CM | POA: Diagnosis present

## 2024-04-19 DIAGNOSIS — Z59869 Financial insecurity, unspecified: Secondary | ICD-10-CM | POA: Diagnosis not present

## 2024-04-19 DIAGNOSIS — Z560 Unemployment, unspecified: Secondary | ICD-10-CM | POA: Diagnosis not present

## 2024-04-19 DIAGNOSIS — R45851 Suicidal ideations: Secondary | ICD-10-CM | POA: Diagnosis present

## 2024-04-19 DIAGNOSIS — Z5982 Transportation insecurity: Secondary | ICD-10-CM | POA: Diagnosis not present

## 2024-04-19 DIAGNOSIS — Z5941 Food insecurity: Secondary | ICD-10-CM

## 2024-04-19 DIAGNOSIS — Z59819 Housing instability, housed unspecified: Secondary | ICD-10-CM

## 2024-04-19 DIAGNOSIS — G8929 Other chronic pain: Secondary | ICD-10-CM | POA: Diagnosis present

## 2024-04-19 DIAGNOSIS — M549 Dorsalgia, unspecified: Secondary | ICD-10-CM | POA: Diagnosis present

## 2024-04-19 DIAGNOSIS — Z8249 Family history of ischemic heart disease and other diseases of the circulatory system: Secondary | ICD-10-CM | POA: Diagnosis not present

## 2024-04-19 DIAGNOSIS — F322 Major depressive disorder, single episode, severe without psychotic features: Secondary | ICD-10-CM | POA: Diagnosis present

## 2024-04-19 DIAGNOSIS — F329 Major depressive disorder, single episode, unspecified: Secondary | ICD-10-CM | POA: Diagnosis not present

## 2024-04-19 LAB — URINE DRUG SCREEN
Amphetamines: NEGATIVE
Barbiturates: NEGATIVE
Benzodiazepines: NEGATIVE
Cocaine: NEGATIVE
Fentanyl: NEGATIVE
Methadone Scn, Ur: NEGATIVE
Opiates: NEGATIVE
Tetrahydrocannabinol: POSITIVE — AB

## 2024-04-19 MED ORDER — LORAZEPAM 2 MG/ML IJ SOLN: 2.0000 mg | Freq: Three times a day (TID) | INTRAMUSCULAR | Status: DC | PRN

## 2024-04-19 MED ORDER — LOPERAMIDE HCL 2 MG PO CAPS: 2.0000 mg | ORAL_CAPSULE | ORAL | Status: AC | PRN

## 2024-04-19 MED ORDER — TRAZODONE HCL 50 MG PO TABS
50.0000 mg | ORAL_TABLET | Freq: Every evening | ORAL | Status: DC | PRN
  Administered 2024-04-23: 50 mg via ORAL
  Filled 2024-04-19: qty 1

## 2024-04-19 MED ORDER — HALOPERIDOL LACTATE 5 MG/ML IJ SOLN: 10.0000 mg | Freq: Three times a day (TID) | INTRAMUSCULAR | Status: DC | PRN

## 2024-04-19 MED ORDER — THIAMINE MONONITRATE 100 MG PO TABS
100.0000 mg | ORAL_TABLET | Freq: Every day | ORAL | Status: DC
  Administered 2024-04-20 – 2024-04-24 (×5): 100 mg via ORAL
  Filled 2024-04-19 (×5): qty 1

## 2024-04-19 MED ORDER — ONDANSETRON 4 MG PO TBDP: 4.0000 mg | ORAL_TABLET | Freq: Four times a day (QID) | ORAL | Status: AC | PRN

## 2024-04-19 MED ORDER — ALUM & MAG HYDROXIDE-SIMETH 200-200-20 MG/5ML PO SUSP: 30.0000 mL | ORAL | Status: DC | PRN

## 2024-04-19 MED ORDER — HYDROXYZINE HCL 25 MG PO TABS: 25.0000 mg | ORAL_TABLET | Freq: Three times a day (TID) | ORAL | Status: DC | PRN

## 2024-04-19 MED ORDER — DIPHENHYDRAMINE HCL 50 MG/ML IJ SOLN: 50.0000 mg | Freq: Three times a day (TID) | INTRAMUSCULAR | Status: DC | PRN

## 2024-04-19 MED ORDER — NICOTINE 14 MG/24HR TD PT24
14.0000 mg | MEDICATED_PATCH | Freq: Every day | TRANSDERMAL | Status: DC
  Administered 2024-04-20: 09:00:00 14 mg via TRANSDERMAL
  Filled 2024-04-19: qty 1

## 2024-04-19 MED ORDER — MAGNESIUM HYDROXIDE 400 MG/5ML PO SUSP: 30.0000 mL | Freq: Every day | ORAL | Status: DC | PRN

## 2024-04-19 MED ORDER — DIPHENHYDRAMINE HCL 25 MG PO CAPS: 50.0000 mg | ORAL_CAPSULE | Freq: Three times a day (TID) | ORAL | Status: DC | PRN

## 2024-04-19 MED ORDER — HALOPERIDOL 5 MG PO TABS: 5.0000 mg | ORAL_TABLET | Freq: Three times a day (TID) | ORAL | Status: DC | PRN

## 2024-04-19 MED ORDER — ADULT MULTIVITAMIN W/MINERALS CH
1.0000 | ORAL_TABLET | Freq: Every day | ORAL | Status: DC
  Administered 2024-04-20 – 2024-04-24 (×5): 1 via ORAL
  Filled 2024-04-19 (×5): qty 1

## 2024-04-19 MED ORDER — HALOPERIDOL LACTATE 5 MG/ML IJ SOLN: 5.0000 mg | Freq: Three times a day (TID) | INTRAMUSCULAR | Status: DC | PRN

## 2024-04-19 NOTE — Plan of Care (Signed)
   Problem: Education: Goal: Emotional status will improve Outcome: Progressing Goal: Mental status will improve Outcome: Progressing Goal: Verbalization of understanding the information provided will improve Outcome: Progressing

## 2024-04-19 NOTE — Progress Notes (Signed)
 Pt was accepted to [facility] on 04/19/2024 Bed assignment:[id number]   Pt meets inpatient criteria per [name], NP   Attending Physician will be: Dr.Jadepalle    Report can be called to: [phone]  Pt can arrive: [facility] to update   Care Team Notified:  [facility] Cherylynn Ernst, RN, Ester Sloop, Paramedic

## 2024-04-19 NOTE — Tx Team (Signed)
 Initial Treatment Plan 04/19/2024 4:47 PM Wyndham Santilli FMW:[id number]    PATIENT STRESSORS: Financial difficulties     PATIENT STRENGTHS: Capable of independent living  Communication skills  Supportive family/friends    PATIENT IDENTIFIED PROBLEMS: Suicide Ideation  Auditory hallucinations   Feelings of depression                 DISCHARGE CRITERIA:  Ability to meet basic life and health needs Improved stabilization in mood, thinking, and/or behavior Motivation to continue treatment in a less acute level of care  PRELIMINARY DISCHARGE PLAN: Return to previous living arrangement  PATIENT/FAMILY INVOLVEMENT: This treatment plan has been presented to and reviewed with the patient, [name], and/or family member.  The patient and family have been given the opportunity to ask questions and make suggestions.  Zona Quan, RN 04/19/2024, 4:47 PM

## 2024-04-19 NOTE — ED Notes (Signed)
 Patient belonging bag moved to locker 35. Red personal bag with patient label beside locker in [place].

## 2024-04-19 NOTE — Group Note (Signed)
 Date:  04/19/2024 Time:  8:41 PM  Group Topic/Focus:  Wrap-Up Group:   The focus of this group is to help patients review their daily goal of treatment and discuss progress on daily workbooks.    Participation Level:  Active  Participation Quality:  Appropriate and Attentive  Affect:  Appropriate  Cognitive:  Alert and Appropriate  Insight: Appropriate and Good  Engagement in Group:  Engaged  Modes of Intervention:  Orientation  Additional Comments:     Arlester CHRISTELLA Servant 04/19/2024, 8:41 PM

## 2024-04-19 NOTE — ED Provider Notes (Signed)
 [place] EMERGENCY DEPARTMENT AT [facility] Provider Note   CSN: [id number] Arrival date & time: 04/18/24  2139     Patient presents with: Suicidal   [name] is a 53 y.o. male with history of alcohol  abuse, colitis, depression, chronic back pain.  Presents to ED complaining of suicidal ideation.  Reports multiple stressors in his personal life including financial stressors, loss of family members recently.  States that he has been increasingly suicidal over the last few days.  Denies any specific plan but does state that he would maybe just drink myself to death.  Denies access to firearms.  Reports he has been hospitalized in the past for SI.  Denies HI, AVH.  Denies any drug or alcohol .  Patient is calm and cooperative.  Patient denies any medical complaints.  HPI     Prior to Admission medications  Medication Sig Start Date End Date Taking? Authorizing Provider  DULoxetine  (CYMBALTA ) 30 MG capsule Take 1 capsule (30 mg total) by mouth 2 (two) times daily. 03/07/24   Nwoko, Uchenna E, PA  feeding supplement (ENSURE PLUS HIGH PROTEIN) LIQD Take 237 mLs by mouth 2 (two) times daily between meals. 02/28/24   Jadapalle, Sree, MD  Multiple Vitamin (MULTIVITAMIN WITH MINERALS) TABS tablet Take 1 tablet by mouth daily. 02/29/24   Jadapalle, Sree, MD  nicotine  (NICODERM CQ  - DOSED IN MG/24 HOURS) 14 mg/24hr patch Place 1 patch (14 mg total) onto the skin daily. 02/29/24   Jadapalle, Sree, MD  ondansetron  (ZOFRAN ) 4 MG tablet Take 1 tablet (4 mg total) by mouth every 6 (six) hours as needed for nausea. 02/17/24   Arlon Carliss ORN, DO    Allergies: Aspirin, Ibuprofen , Porcine (pork) protein-containing drug products, Acetaminophen , and Sertraline     Review of Systems  All other systems reviewed and are negative.   Updated Vital Signs BP 124/80   Pulse 60   Temp 98 F (36.7 C)   Resp 16   SpO2 100%   Physical Exam Vitals and nursing note reviewed.   Constitutional:      General: He is not in acute distress.    Appearance: He is well-developed.  HENT:     Head: Normocephalic and atraumatic.  Eyes:     Conjunctiva/sclera: Conjunctivae normal.  Cardiovascular:     Rate and Rhythm: Normal rate and regular rhythm.     Heart sounds: No murmur heard. Pulmonary:     Effort: Pulmonary effort is normal. No respiratory distress.     Breath sounds: Normal breath sounds.  Abdominal:     Palpations: Abdomen is soft.     Tenderness: There is no abdominal tenderness.  Musculoskeletal:        General: No swelling.     Cervical back: Neck supple.  Skin:    General: Skin is warm and dry.     Capillary Refill: Capillary refill takes less than 2 seconds.  Neurological:     Mental Status: He is alert and oriented to person, place, and time. Mental status is at baseline.  Psychiatric:        Mood and Affect: Mood normal.        Behavior: Behavior normal.     (all labs ordered are listed, but only abnormal results are displayed) Labs Reviewed  CBC WITH DIFFERENTIAL/PLATELET - Abnormal; Notable for the following components:      Result Value   WBC 10.6 (*)    RBC 4.03 (*)    Hemoglobin 12.4 (*)  HCT 38.4 (*)    All other components within normal limits  COMPREHENSIVE METABOLIC PANEL WITH GFR - Abnormal; Notable for the following components:   Glucose, Bld 103 (*)    BUN 22 (*)    All other components within normal limits  ETHANOL  URINE DRUG SCREEN    EKG: None  Radiology: No results found.  Procedures   Medications Ordered in the ED - No data to display  Medical Decision Making Amount and/or Complexity of Data Reviewed Labs: ordered.   53 year old presents complaining of suicidal ideation.  On exam, HD stable.  Lung sounds clear bilaterally, no hypoxia.  Abdomen soft and compressible.  Neuroexam at baseline.  Overall nontoxic in appearance.  Calm and cooperative.  Screening labs obtained.  Largely unremarkable.   Patient has been placed in ED psych hold, has had home medications ordered, TTS consult has been placed.  Patient medically cleared at this time for TTS disposition.    Final diagnoses:  Suicidal ideation  Encounter for psychiatric assessment    ED Discharge Orders     None          Ruthell Lonni JULIANNA DEVONNA 04/19/24 0057    Trine Raynell Moder, MD 04/19/24 [phone]

## 2024-04-19 NOTE — Plan of Care (Signed)
   Problem: Education: Goal: Emotional status will improve Outcome: Progressing Goal: Mental status will improve Outcome: Progressing

## 2024-04-19 NOTE — ED Provider Notes (Incomplete)
 [place] EMERGENCY DEPARTMENT AT [facility] Provider Note   CSN: [id number] Arrival date & time: 04/18/24  2139     Patient presents with: Suicidal   [name] is a 53 y.o. male with history of alcohol  abuse, colitis, depression, chronic back pain.  Presents to ED complaining of suicidal ideation.  Reports multiple stressors in his personal life including financial stressors, loss of family members recently.  States that he has been increasingly suicidal over the last few days.  Denies any specific plan but does state that he would maybe just drink myself to death.  Denies access to firearms.  Reports he has been hospitalized in the past for SI.  Denies HI, AVH.  Denies any drug or alcohol .  Patient is calm and cooperative.  HPI     Prior to Admission medications  Medication Sig Start Date End Date Taking? Authorizing Provider  DULoxetine  (CYMBALTA ) 30 MG capsule Take 1 capsule (30 mg total) by mouth 2 (two) times daily. 03/07/24   Nwoko, Uchenna E, PA  feeding supplement (ENSURE PLUS HIGH PROTEIN) LIQD Take 237 mLs by mouth 2 (two) times daily between meals. 02/28/24   Jadapalle, Sree, MD  Multiple Vitamin (MULTIVITAMIN WITH MINERALS) TABS tablet Take 1 tablet by mouth daily. 02/29/24   Jadapalle, Sree, MD  nicotine  (NICODERM CQ  - DOSED IN MG/24 HOURS) 14 mg/24hr patch Place 1 patch (14 mg total) onto the skin daily. 02/29/24   Jadapalle, Sree, MD  ondansetron  (ZOFRAN ) 4 MG tablet Take 1 tablet (4 mg total) by mouth every 6 (six) hours as needed for nausea. 02/17/24   Arlon Carliss ORN, DO    Allergies: Aspirin, Ibuprofen , Porcine (pork) protein-containing drug products, Acetaminophen , and Sertraline     Review of Systems  Updated Vital Signs BP 124/73 (BP Location: Left Arm)   Pulse (!) 52   Temp 97.7 F (36.5 C) (Oral)   Resp 18   SpO2 98%   Physical Exam  (all labs ordered are listed, but only abnormal results are displayed) Labs Reviewed  CBC WITH  DIFFERENTIAL/PLATELET - Abnormal; Notable for the following components:      Result Value   WBC 10.6 (*)    RBC 4.03 (*)    Hemoglobin 12.4 (*)    HCT 38.4 (*)    All other components within normal limits  COMPREHENSIVE METABOLIC PANEL WITH GFR - Abnormal; Notable for the following components:   Glucose, Bld 103 (*)    BUN 22 (*)    All other components within normal limits  ETHANOL  URINE DRUG SCREEN    EKG: None  Radiology: No results found.  {Document cardiac monitor, telemetry assessment procedure when appropriate:32947} Procedures   Medications Ordered in the ED - No data to display    {Click here for ABCD2, HEART and other calculators REFRESH Note before signing:1}                              Medical Decision Making Amount and/or Complexity of Data Reviewed Labs: ordered.   ***  {Document critical care time when appropriate  Document review of labs and clinical decision tools ie CHADS2VASC2, etc  Document your independent review of radiology images and any outside records  Document your discussion with family members, caretakers and with consultants  Document social determinants of health affecting pt's care  Document your decision making why or why not admission, treatments were needed:32947:::1}   Final diagnoses:  None    ED Discharge Orders     None

## 2024-04-19 NOTE — Progress Notes (Signed)
°  ADMISSION DAR NOTE:   Pt presented as a walk in under voluntary status. Alert and oriented by 3. Pt is irritable denied to answer a lot of questions stating   I wanted to go to the [facility]. not here I don't really care for this hospital much. logical speech and fair eye contact.  Reports worsening depression and Passive SI no plan or intent. Verbally contracted for safety.   Reports history of Auditory Hallucinations, Denies SI/A/VH at present and verbally contracts for safety. Denies any history of alcohol . Reports smoking marijuana everyday.  Emotional support and availability offered to Patient as needed. Skin assessment done and belongings searched per protocol. Items deemed contraband secured in locker. Unit orientation and routine discussed, Care Plan reviewed as well and Patient verbalized understanding. Fluids and Food offered, tolerated well. Q15 minutes safety checks initiated without self harm gestures.

## 2024-04-19 NOTE — ED Notes (Signed)
Safe transport called for transport to [facility]

## 2024-04-19 NOTE — BH Assessment (Signed)
 Patient was deferred to IRIS for a telepsych assessment. The assigned care coordinator will provide updates regarding the scheduling of the assessment. IRIS coordinator can be reached at [phone] for further information on the timing of the telepsych evaluation.

## 2024-04-19 NOTE — Progress Notes (Signed)
°   04/19/24 2115  Psychosocial Assessment  Patient Complaints None  Eye Contact Fair  Facial Expression Other (Comment) (WNL)  Affect Appropriate to circumstance  Speech Logical/coherent  Interaction Assertive  Motor Activity Slow  Appearance/Hygiene Unremarkable  Behavior Characteristics Cooperative;Appropriate to situation  Mood Other (Comment) (WNL)  Thought Process  Coherency WDL  Content WDL  Delusions None reported or observed  Perception WDL  Hallucination None reported or observed  Judgment Impaired  Confusion None  Danger to Self  Current suicidal ideation? Denies  Agreement Not to Harm Self Yes  Description of Agreement verbal  Danger to Others  Danger to Others None reported or observed

## 2024-04-19 NOTE — BH Assessment (Signed)
 Comprehensive Clinical Assessment (CCA) Note   04/19/2024 [name] [id number]  Disposition: Per [name] Ivans, NP, patient is recommended for inpatient admission.  Disposition SW to pursue appropriate inpatient options.  The patient demonstrates the following risk factors for suicide: Chronic risk factors for suicide include: psychiatric disorder of MDD. Acute risk factors for suicide include: unemployment and social withdrawal/isolation. Protective factors for this patient include: positive social support. Considering these factors, the overall suicide risk at this point appears to be moderate. Patient is not appropriate for outpatient follow up.   Patient is a 53 year old male with a history of depression who presents voluntarily to [facility] ED for an assessment. Patient resides in the home alone and is in need of  support system.Patient reports isolation, crying spells, irritability, hopelessness, guilt, loss of interest to do things they enjoy, fatigue, lack of concentration, worthlessness, change in sleep, change in appetite. Patient reports history of past suicide attempts, last occurrence was 10 years ago.  Patient has a hx of Substance Abuse: EOTH Last use was 12 years ago .Patient denies NSSIB, HI, AVH. Pt reports passive SI. Pt reports he does not have a set plan but he is finding himself sitting at home thinking of random plans in his mind. Pt reports no intent to harm self .  Patient identifies his primary stressors as grief and financial hardship. Patient denies history of abuse or trauma. Patient denies current legal problems. Patient is not receiving outpatient therapy and psychiatry services. Patient reports he does not  take any psychotropic medications. Patient reports previous inpatient admission at [facility] for depression in 01/2024.  Patient denies access to weapons.  During evaluation pt is in no acute distress. He is alert, oriented x 4, calm, cooperative and attentive. his mood  is depressed and flat with congruent affect. He has normal speech, and behavior.  Objectively there is no evidence of psychosis/mania or delusional thinking.  Patient is able to converse coherently, goal directed thoughts, no distractibility, or pre-occupation.   He also denies suicidal/self-harm/homicidal ideation, psychosis, and paranoia.  Patient answered question appropriately.       Chief Complaint:  Chief Complaint  Patient presents with   Suicidal   Visit Diagnosis: Major Depressive Disorder     CCA Screening, Triage and Referral (STR)  Patient Reported Information How did you hear about us ? -- ([facility] ED)  What Is the Reason for Your Visit/Call Today? Per EDP's note:  Pt is a 53 y.o. male with history of alcohol  abuse, colitis, depression, chronic back pain.  Presents to ED complaining of suicidal ideation.  Reports multiple stressors in his personal life including financial stressors, loss of family members recently.  States that he has been increasingly suicidal over the last few days.  Denies any specific plan but does state that he would maybe just drink myself to death.  Denies access to firearms.  Reports he has been hospitalized in the past for SI.  Denies HI, AVH.  Denies any drug or alcohol .  Patient is calm and cooperative.  Patient denies any medical complaints.   How Long Has This Been Causing You Problems? > than 6 months  What Do You Feel Would Help You the Most Today? Treatment for Depression or other mood problem; Stress Management; Medication(s)   Have You Recently Had Any Thoughts About Hurting Yourself? Yes  Are You Planning to Commit Suicide/Harm Yourself At This time? No   Flowsheet Row ED from 04/18/2024 in [facility] Emergency Department at [facility] Clinical Support from 03/07/2024 in [facility] Admission (Discharged) from 02/23/2024 in [facility] INPATIENT BEHAVIORAL MEDICINE  C-SSRS RISK CATEGORY Error: Q7 should not  be populated when Q6 is No High Risk Moderate Risk    Have you Recently Had Thoughts About Hurting Someone [name]? No  Are You Planning to Harm Someone at This Time? No  Explanation: Denies HI   Have You Used Any Alcohol  or Drugs in the Past 24 Hours? No  How Long Ago Did You Use Drugs or Alcohol ? N/a What Did You Use and How Much? N/a  Do You Currently Have a Therapist/Psychiatrist? No  Name of Therapist/Psychiatrist:  n/a  Have You Been Recently Discharged From Any Office Practice or Programs? No  Explanation of Discharge From Practice/Program:n/a    CCA Screening Triage Referral Assessment Type of Contact: Tele-Assessment  Telemedicine Service Delivery: Telemedicine service delivery: This service was provided via telemedicine using a 2-way, interactive audio and video technology  Is this Initial or Reassessment? Is this Initial or Reassessment?: Initial Assessment  Date Telepsych consult ordered in [facility]:  Date Telepsych consult ordered in [facility]: 04/19/24  Time Telepsych consult ordered in [facility]:  Time Telepsych consult ordered in [facility]: 0400  Location of Assessment: [facility] ED  Provider Location: [facility] Assessment Services   Collateral Involvement: none   Does Patient Have a Automotive Engineer Guardian? No  Legal Guardian Contact Information: n/a  Copy of Legal Guardianship Form: -- (n/a)  Legal Guardian Notified of Arrival: -- (n/a)  Legal Guardian Notified of Pending Discharge: -- (n/a)  If Minor and Not Living with Parent(s), Who has Custody? n/a  Is CPS involved or ever been involved? Never  Is APS involved or ever been involved? Never   Patient Determined To Be At Risk for Harm To Self or Others Based on Review of Patient Reported Information or Presenting Complaint? Yes, for Self-Harm  Method: Plan without intent  Availability of Means: No access or NA  Intent: Vague intent or NA  Notification Required: No need or identified person  Additional  Information for Danger to Others Potential: -- (n/a)  Additional Comments for Danger to Others Potential: n/a  Are There Guns or Other Weapons in Your Home? -- (Denies access)  Types of Guns/Weapons: Denies access  Are These Weapons Safely Secured?                            -- (n/a)  Who Could Verify You Are Able To Have These Secured: Denies access  Do You Have any Outstanding Charges, Pending Court Dates, Parole/Probation? Denies pending legal charges  Contacted To Inform of Risk of Harm To Self or Others: Other: Comment (n/a)    Does Patient Present under Involuntary Commitment? No    [place] of Residence: [place]   Patient Currently Receiving the Following Services: Not Receiving Services   Determination of Need: Urgent (48 hours)   Options For Referral: Inpatient Hospitalization     CCA Biopsychosocial Patient Reported Schizophrenia/Schizoaffective Diagnosis in Past: No   Strengths: Pt is seeking assistance for his mental health concerns. He answered the questions posed. Pt is able to identify his thoughts, feelings, and concerns.   Mental Health Symptoms Depression:  Difficulty Concentrating; Fatigue; Hopelessness; Sleep (too much or little)   Duration of Depressive symptoms: Duration of Depressive Symptoms: Greater than two weeks   Mania:  None   Anxiety:   Worrying; Tension; Sleep  Psychosis:  None   Duration of Psychotic symptoms:    Trauma:  Emotional numbing; Detachment from others   Obsessions:  None   Compulsions:  None   Inattention:  None   Hyperactivity/Impulsivity:  None   Oppositional/Defiant Behaviors:  None   Emotional Irregularity:  Potentially harmful impulsivity; Mood lability; Recurrent suicidal behaviors/gestures/threats   Other Mood/Personality Symptoms:  None noted    Mental Status Exam Appearance and self-care  Stature:  Tall   Weight:  Thin   Clothing:  Casual (Pt in scrubs)   Grooming:  Normal   Cosmetic  use:  None   Posture/gait:  Normal   Motor activity:  Not Remarkable   Sensorium  Attention:  Normal   Concentration:  Normal   Orientation:  Situation; Place; Person; Object; Time   Recall/memory:  Normal   Affect and Mood  Affect:  Anxious; Depressed   Mood:  Anxious; Depressed   Relating  Eye contact:  Normal   Facial expression:  Depressed   Attitude toward examiner:  Cooperative   Thought and Language  Speech flow: Clear and Coherent   Thought content:  Appropriate to Mood and Circumstances   Preoccupation:  None   Hallucinations:  None   Organization:  Coherent; Development Worker, [employer] of Knowledge:  Average   Intelligence:  Average   Abstraction:  Normal   Judgement:  Fair   Dance Movement Psychotherapist:  Adequate   Insight:  Poor   Decision Making:  Impulsive   Social Functioning  Social Maturity:  Isolates   Social Judgement:  Heedless   Stress  Stressors:  Family conflict; Grief/losses; Housing; Surveyor, Quantity; Work   Coping Ability:  Deficient supports; Overwhelmed   Skill Deficits:  Decision making; Responsibility   Supports:  Support needed     Religion: Religion/Spirituality Are You A Religious Person?: Yes What is Your Religious Affiliation?: Christian How Might This Affect Treatment?: No affect on treatment  Leisure/Recreation: Leisure / Recreation Do You Have Hobbies?: No  Exercise/Diet: Exercise/Diet Do You Exercise?: No Have You Gained or Lost A Significant Amount of Weight in the Past Six Months?: No Do You Follow a Special Diet?: No Do You Have Any Trouble Sleeping?: No   CCA Employment/Education Employment/Work Situation: Employment / Work Situation Employment Situation: Unemployed Patient's Job has Been Impacted by Current Illness: No Has Patient ever Been in Equities Trader?: No  Education: Education Is Patient Currently Attending School?: No Last Grade Completed: 12 (GED) Did You Attend College?: No Did  You Have An Individualized Education Program (IIEP): No Did You Have Any Difficulty At School?: No Patient's Education Has Been Impacted by Current Illness: No   CCA Family/Childhood History Family and Relationship History: Family history Marital status: Single Does patient have children?: Yes How many children?:  (UTA) How is patient's relationship with their children?: UTA  Childhood History:  Childhood History By whom was/is the patient raised?: Both parents Did patient suffer any verbal/emotional/physical/sexual abuse as a child?: No Did patient suffer from severe childhood neglect?: No Has patient ever been sexually abused/assaulted/raped as an adolescent or adult?: No Was the patient ever a victim of a crime or a disaster?: No Witnessed domestic violence?: No Has patient been affected by domestic violence as an adult?: No       CCA Substance Use Alcohol /Drug Use: Alcohol  / Drug Use Pain Medications: See MAR Prescriptions: See MAR Over the Counter: See MAR History of alcohol  / drug use?: Yes Longest period of sobriety (when/how  long): Per chart pt has a hx of alcoholism but denies he currently drinks. Pt reports that he has not drank EOTH in 12 years Negative Consequences of Use: Financial, Personal relationships Withdrawal Symptoms: None                         ASAM's:  Six Dimensions of Multidimensional Assessment  Dimension 1:  Acute Intoxication and/or Withdrawal Potential:   Dimension 1:  Description of individual's past and current experiences of substance use and withdrawal: n/a  Dimension 2:  Biomedical Conditions and Complications:   Dimension 2:  Description of patient's biomedical conditions and  complications: n/a  Dimension 3:  Emotional, Behavioral, or Cognitive Conditions and Complications:  Dimension 3:  Description of emotional, behavioral, or cognitive conditions and complications: n/a  Dimension 4:  Readiness to Change:  Dimension 4:   Description of Readiness to Change criteria: n/a  Dimension 5:  Relapse, Continued use, or Continued Problem Potential:  Dimension 5:  Relapse, continued use, or continued problem potential critiera description: n/a  Dimension 6:  Recovery/Living Environment:  Dimension 6:  Recovery/Iiving environment criteria description: n/a  ASAM Severity Score:    ASAM Recommended Level of Treatment: ASAM Recommended Level of Treatment:  (N/A)   Substance use Disorder (SUD) Substance Use Disorder (SUD)  Checklist Symptoms of Substance Use:  (N/A)  Recommendations for Services/Supports/Treatments: Recommendations for Services/Supports/Treatments Recommendations For Services/Supports/Treatments: Individual Therapy, Medication Management, Inpatient Hospitalization (observation)  Disposition Recommendation per psychiatric provider: We recommend inpatient psychiatric hospitalization when medically cleared. Patient is under voluntary admission status at this time; please IVC if attempts to leave hospital.   DSM5 Diagnoses: Patient Active Problem List   Diagnosis Date Noted   Anxiety state 03/07/2024   MDD (major depressive disorder), recurrent episode, moderate (HCC) 02/25/2024   Sinus bradycardia 02/23/2024   Chest pain 02/23/2024   Underweight (BMI < 18.5) 02/23/2024   History of sepsis secondary pneumonia (02/17/2024) 02/23/2024   Finding of multiple premature atrial contractions by electrocardiography 02/23/2024   History of substance abuse (HCC) 02/23/2024   Chronic anemia 02/23/2024   Sepsis due to pneumonia (HCC) 02/16/2024   Right leg pain 02/16/2024   Alcohol  abuse, in remission 06/29/2023   MDD (major depressive disorder) 03/23/2023   Hip pain 10/14/2021   Cannabis use disorder 09/30/2021   MDD (major depressive disorder), recurrent episode, severe (HCC) 03/17/2019   Essential hypertension 09/03/2014   Tobacco use disorder 09/03/2014   Substance abuse (HCC)    Mild depression     Cellulitis of nasal tip    Chronic back pain    Cellulitis of nose 08/26/2014   Cellulitis 08/26/2014   MDD (major depressive disorder), recurrent severe, without psychosis (HCC) 06/09/2014   Major depressive disorder, recurrent, severe with psychotic features (HCC) 06/08/2011   Alcohol  use disorder, severe, in sustained remission (HCC) 06/08/2011   Polysubstance abuse (HCC) 05/31/2011     Referrals to Alternative Service(s): Referred to Alternative Service(s):   Place:   Date:   Time:    Referred to Alternative Service(s):   Place:   Date:   Time:    Referred to Alternative Service(s):   Place:   Date:   Time:    Referred to Alternative Service(s):   Place:   Date:   Time:     Rosina PARAS, [place], [facility]

## 2024-04-20 DIAGNOSIS — F329 Major depressive disorder, single episode, unspecified: Secondary | ICD-10-CM | POA: Diagnosis not present

## 2024-04-20 MED ORDER — ENSURE PLUS HIGH PROTEIN PO LIQD
237.0000 mL | Freq: Three times a day (TID) | ORAL | Status: DC
  Administered 2024-04-20 – 2024-04-23 (×9): 237 mL via ORAL

## 2024-04-20 NOTE — Group Note (Signed)
 Recreation Therapy Group Note   Group Topic:Goal Setting  Group Date: 04/20/2024 Start Time: 1000 End Time: 1050 Facilitators: Celestia Jeoffrey BRAVO, LRT, CTRS Location: Craft Room  Group Description: Product/process Development Scientist. Patients were given many different magazines, a glue stick, markers, and a piece of cardstock paper. LRT and pts discussed the importance of having goals in life. LRT and pts discussed the difference between short-term and long-term goals, as well as what a SMART goal is. LRT encouraged pts to create a vision board, with images they picked and then cut out with safety scissors from the magazine, for themselves, that capture their short and long-term goals. LRT encouraged pts to show and explain their vision board to the group.   Goal Area(s) Addressed:  Patient will gain knowledge of short vs. long term goals.  Patient will identify goals for themselves. Patient will practice setting SMART goals. Patient will verbalize their goals to LRT and peers. \ Affect/Mood: Appropriate   Participation Level: Moderate    Clinical Observations/Individualized Feedback: [name] came late to group. Pt shared that his goals are to stay away from negative people and to go home'.   Plan: Continue to engage patient in RT group sessions 2-3x/week.   Jeoffrey BRAVO Celestia, LRT, CTRS 04/20/2024 11:44 AM

## 2024-04-20 NOTE — Group Note (Signed)
 [facility] LCSW Group Therapy Note   Group Date: 04/20/2024 Start Time: 1300 End Time: 1400   Type of Therapy/Topic:  Group Therapy:  Balance in Life  Participation Level:  Active   Description of Group:    This group will address the concept of balance and how it feels and looks when one is unbalanced. Patients will be encouraged to process areas in their lives that are out of balance, and identify reasons for remaining unbalanced. Facilitators will guide patients utilizing problem- solving interventions to address and correct the stressor making their life unbalanced. Understanding and applying boundaries will be explored and addressed for obtaining  and maintaining a balanced life. Patients will be encouraged to explore ways to assertively make their unbalanced needs known to significant others in their lives, using other group members and facilitator for support and feedback.  Therapeutic Goals: Patient will identify two or more emotions or situations they have that consume much of in their lives. Patient will identify signs/triggers that life has become out of balance:  Patient will identify two ways to set boundaries in order to achieve balance in their lives:  Patient will demonstrate ability to communicate their needs through discussion and/or role plays  Summary of Patient Progress: Patient was present for the entirety of the group process. He was actively engaged in the discussion. Pt appeared to have slight insight into himself and the topic. He appeared open and receptive to feedback/comments from both his peers and the facilitator.  Therapeutic Modalities:   Cognitive Behavioral Therapy Solution-Focused Therapy Assertiveness Training   Nadara JONELLE Fam, LCSW

## 2024-04-20 NOTE — Group Note (Signed)
 Date:  04/20/2024 Time:  5:11 PM  Group Topic/Focus:  Activity Group: The focus of the group is to promote activity for the patients and encourage them to go outside to the courtyard and get some fresh air and exercise.    Participation Level:  Active  Participation Quality:  Appropriate  Affect:  Appropriate  Cognitive:  Appropriate  Insight: Appropriate  Engagement in Group:  Engaged  Modes of Intervention:  Activity  Additional Comments:    [name] 04/20/2024, 5:11 PM

## 2024-04-20 NOTE — Group Note (Signed)
 Recreation Therapy Group Note   Group Topic:General Recreation  Group Date: 04/20/2024 Start Time: 1525 End Time: 1545 Facilitators: Celestia Jeoffrey BRAVO, LRT, CTRS Location: Courtyard  Group Description: [employer]. LRT and patients played games of basketball, drew with chalk, and played corn hole while outside in the courtyard while getting fresh air and sunlight. Music was being played in the background. LRT and peers conversed about different games they have played before, what they do in their free time and anything else that is on their minds. LRT encouraged pts to drink water after being outside, sweating and getting their heart rate up.  Goal Area(s) Addressed: Patient will build on frustration tolerance skills. Patients will partake in a competitive play game with peers. Patients will gain knowledge of new leisure interest/hobby.    Affect/Mood: N/A   Participation Level: Did not attend    Clinical Observations/Individualized Feedback: Patient did not attend.  Plan: Continue to engage patient in RT group sessions 2-3x/week.   Jeoffrey BRAVO Celestia, LRT, CTRS 04/20/2024 4:20 PM

## 2024-04-20 NOTE — [facility] Suicide Risk Assessment (Cosign Needed)
 [facility] Admission Suicide Risk Assessment   Nursing information obtained from:  Patient Demographic factors:  Male Current Mental Status:  Suicidal ideation indicated by patient Loss Factors:  Financial problems / change in socioeconomic status Historical Factors:  Prior suicide attempts Risk Reduction Factors:  Positive social support  Total Time spent with patient: 1 hour Principal Problem: MDD (major depressive disorder), severe (HCC) Diagnosis:  Principal Problem:   MDD (major depressive disorder), severe (HCC)  Subjective Data:  Reason for admission: Suicidal ideation with fear of relapse to alcohol  use.   HPI: Patient is a 53 year-old male admitted to inpatient psychiatric unit endorsing suicidal thoughts. Patient reports feeling like he had a breakdown. Patient describes sudden, intrusive urge to drink alcohol  after 13 years of sobriety while alone at home. This urge significantly alarmed patient given past ideation of drinking himself to death. Fear of relapse combined with worsening depression prompted patient to seek psychiatric care.   Patient reports multiple severe psychosocial stressors contributing to current decompensation. 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 This has resulted in severe financial crisis including: utilities shut off (TV, phone), recently denied for food stamps, unintentional weight loss due to food insecurity, inability to pay bills, and concern that water will be shut off during hospitalization. Patient has attempted to obtain disability benefits through legal assistance without success to date. Patient describes feeling unable to upkeep himself due to financial constraints.    Patient reports history of depression and suicidal ideation, including past thoughts of drinking himself to death. Previous medication trials include sertraline  and duloxetine , patient unable to recall other  specific trials. Patient reports multiple medication trials were ineffective.   Current medications: None currently. Patient discontinued all psychiatric medications as he does not see much benefit from the medications.   Substance use history: History of alcohol  use disorder, 13 years sober prior to current urges.   Medical history: Chronic leg and back pain (etiology unclear), patient reported no established PCP. Pain significantly impacts functional capacity and ability to work.   Social history: Lives alone. 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 Currently unemployed with severe financial instability. No insurance/limited access to healthcare. Denied for food stamps. Applying for disability.   MSE: Patient endorses current suicidal ideation. Denies current plan or intent. Reports increased depression and anxiety related to financial stressors and fear of relapse. Denies auditory or visual hallucinations. Patient does not appear to be responding to internal stimuli on current exam.   Continued Clinical Symptoms:  Alcohol  Use Disorder Identification Test Final Score (AUDIT): 2 The Alcohol  Use Disorders Identification Test, Guidelines for Use in Primary Care, Second Edition.  [employer] [facility]). Score between 0-7:  no or low risk or alcohol  related problems. Score between 8-15:  moderate risk of alcohol  related problems. Score between 16-19:  high risk of alcohol  related problems. Score 20 or above:  warrants further diagnostic evaluation for alcohol  dependence and treatment.   CLINICAL FACTORS:   Depression:   Hopelessness   Musculoskeletal: Strength & Muscle Tone: within normal limits Gait & Station: normal Patient leans: N/A  Psychiatric Specialty Exam:  Presentation  General Appearance:  Appropriate for Environment  Eye Contact: Fair  Speech: Clear and Coherent  Speech Volume: Normal  Handedness: Right   Mood and  Affect  Mood: Depressed  Affect: Congruent   Thought Process  Thought Processes: Coherent; Linear; Goal Directed  Descriptions of Associations:Intact  Orientation:Full (Time, Place and Person)  Thought Content:Logical  History of Schizophrenia/Schizoaffective disorder:No  Duration of Psychotic Symptoms:No data recorded Hallucinations:Hallucinations: None  Ideas of Reference:None  Suicidal Thoughts:Suicidal Thoughts: Yes, Passive SI Passive Intent and/or Plan: Without Intent; Without Plan  Homicidal Thoughts:Homicidal Thoughts: No   Sensorium  Memory: Immediate Fair; Recent Fair; Remote Fair  Judgment: Impaired  Insight: Shallow   Executive Functions  Concentration: Fair  Attention Span: Fair  Recall: Fair  Fund of Knowledge: Fair  Language: Fair   Psychomotor Activity  Psychomotor Activity:Psychomotor Activity: Normal   Assets  Assets: Manufacturing Systems Engineer; Desire for Improvement   Sleep  Sleep:Sleep: Fair    Physical Exam: Physical Exam Pulmonary:     Effort: Pulmonary effort is normal.  Neurological:     Mental Status: He is alert and oriented to person, place, and time.    Review of Systems  Respiratory:  Negative for shortness of breath.   Cardiovascular:  Negative for chest pain.  Neurological:  Negative for headaches.  Psychiatric/Behavioral:  Positive for depression and suicidal ideas. Negative for hallucinations.    Blood pressure 126/76, pulse (!) 54, temperature 98.3 F (36.8 C), temperature source Oral, resp. rate 18, height 5' 7 (1.702 m), weight 55.8 kg, SpO2 100%. Body mass index is 19.26 kg/m.   COGNITIVE FEATURES THAT CONTRIBUTE TO RISK:  Closed-mindedness    SUICIDE RISK:   Moderate:  Frequent suicidal ideation with limited intensity, and duration, some specificity in terms of plans, no associated intent, good self-control, limited dysphoria/symptomatology, some risk factors present, and identifiable  protective factors, including available and accessible social support.  PLAN OF CARE: Admit patient to the inpatient psychiatric unit for further monitoring and stabilization  I certify that inpatient services furnished can reasonably be expected to improve the patient's condition.  Zelda Sharps, NP

## 2024-04-20 NOTE — H&P (Cosign Needed)
 Psychiatric Admission Assessment Adult  Patient Identification: [name] MRN:  [id number] Date of Evaluation:  04/20/2024 Chief Complaint:  MDD (major depressive disorder), severe (HCC) [F32.2]   History of Present Illness: Reason for admission: Suicidal ideation with fear of relapse to alcohol  use.  HPI: Patient is a 53 year-old male admitted to inpatient psychiatric unit endorsing suicidal thoughts. Patient reports feeling like he had a breakdown. Patient describes sudden, intrusive urge to drink alcohol  after 13 years of sobriety while alone at home. This urge significantly alarmed patient given past ideation of drinking himself to death. Fear of relapse combined with worsening depression prompted patient to seek psychiatric care.  Patient reports multiple severe psychosocial stressors contributing to current decompensation. 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 This has resulted in severe financial crisis including: utilities shut off (TV, phone), recently denied for food stamps, unintentional weight loss due to food insecurity, inability to pay bills, and concern that water will be shut off during hospitalization. Patient has attempted to obtain disability benefits through legal assistance without success to date. Patient describes feeling unable to upkeep himself due to financial constraints.   Patient reports history of depression and suicidal ideation, including past thoughts of drinking himself to death. Previous medication trials include sertraline  and duloxetine , patient unable to recall other specific trials. Patient reports multiple medication trials were ineffective.  Current medications: None currently. Patient discontinued all psychiatric medications as he does not see much benefit from the medications.  Substance use history: History of alcohol  use disorder, 13 years sober prior to current  urges.  Medical history: Chronic leg and back pain (etiology unclear), patient reported no established PCP. Pain significantly impacts functional capacity and ability to work.  Social history: Lives alone. 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 Currently unemployed with severe financial instability. No insurance/limited access to healthcare. Denied for food stamps. Applying for disability.  MSE: Patient endorses current suicidal ideation. Denies current plan or intent. Reports increased depression and anxiety related to financial stressors and fear of relapse. Denies auditory or visual hallucinations. Patient does not appear to be responding to internal stimuli on current exam.   Total Time spent with patient: 1 hour Sleep  Sleep:Sleep: Fair  Past Psychiatric History: Hx of polysubstance abuse, depression Psychiatric History:  Information collected from Patient/chart review   Prev Dx/Sx: Polysubstance abuse, depression Current Psych Provider: Patient reported not currently aligned with outpatient provider Home Meds (current): Unable to afford medications that were prescribed to him previously Previous Med Trials: Sertraline - reported could not tolerate Therapy: Denied   Prior Psych Hospitalization: Yes  Prior Self Harm: Denied Prior Violence: Denied   Family Psych History: Unsure Family Hx suicide: Denied   Social History:  Developmental Hx: Met milestones as far as patient is aware Educational Hx: Unknown Occupational Hx: Currently unemployed Legal Hx: Previous drug charges, denied current charges Living Situation: Reported he lives in section 8 housing Spiritual Hx: Unknown Access to weapons/lethal means: Denied    Substance History Alcohol : Denied current use  Tobacco:Cigarettes daily Illicit drugs: THC use Prescription drug abuse: denied Rehab hx: denied Is the patient at risk to self? Yes.    Has the patient been a risk to self in the  past 6 months? Yes.    Has the patient been a risk to self within the distant past? Yes.    Is the patient a risk  to others? No.  Has the patient been a risk to others in the past 6 months? No.  Has the patient been a risk to others within the distant past? No.   Columbia Scale:  Flowsheet Row Admission (Current) from 04/19/2024 in [facility] INPATIENT BEHAVIORAL MEDICINE ED from 04/18/2024 in [facility] Emergency Department at [facility] Clinical Support from 03/07/2024 in [facility]  C-SSRS RISK CATEGORY Low Risk Error: Q7 should not be populated when Q6 is No High Risk     Past Medical History:  Past Medical History:  Diagnosis Date   Alcohol  abuse    Anemia    Colitis    Depression    Severe major depression with psychotic features (HCC)    History reviewed. No pertinent surgical history. Family History:  Family History  Problem Relation Age of Onset   Hypertension Mother    Hypertension Father    Hypertension Other     Social History:  Social History   Substance and Sexual Activity  Alcohol  Use No     Social History   Substance and Sexual Activity  Drug Use Yes   Types: Marijuana      Allergies:  Allergies[1] Lab Results:  Results for orders placed or performed during the hospital encounter of 04/18/24 (from the past 48 hours)  CBC with Differential     Status: Abnormal   Collection Time: 04/18/24  9:48 PM  Result Value Ref Range   WBC 10.6 (H) 4.0 - 10.5 K/uL   RBC 4.03 (L) 4.22 - 5.81 MIL/uL   Hemoglobin 12.4 (L) 13.0 - 17.0 g/dL   HCT 61.5 (L) 60.9 - 47.9 %   MCV 95.3 80.0 - 100.0 fL   MCH 30.8 26.0 - 34.0 pg   MCHC 32.3 30.0 - 36.0 g/dL   RDW 86.4 88.4 - 84.4 %   Platelets 240 150 - 400 K/uL   nRBC 0.0 0.0 - 0.2 %   Neutrophils Relative % 56 %   Neutro Abs 5.8 1.7 - 7.7 K/uL   Lymphocytes Relative 31 %   Lymphs Abs 3.3 0.7 - 4.0 K/uL   Monocytes Relative 9 %   Monocytes Absolute 0.9 0.1 - 1.0 K/uL   Eosinophils  Relative 4 %   Eosinophils Absolute 0.4 0.0 - 0.5 K/uL   Basophils Relative 0 %   Basophils Absolute 0.0 0.0 - 0.1 K/uL   Immature Granulocytes 0 %   Abs Immature Granulocytes 0.03 0.00 - 0.07 K/uL    Comment: Performed at [facility], [address], [place], [place] [address]  Comprehensive metabolic panel     Status: Abnormal   Collection Time: 04/18/24  9:48 PM  Result Value Ref Range   Sodium 139 135 - 145 mmol/L   Potassium 4.1 3.5 - 5.1 mmol/L   Chloride 102 98 - 111 mmol/L   CO2 27 22 - 32 mmol/L   Glucose, Bld 103 (H) 70 - 99 mg/dL    Comment: Glucose reference range applies only to samples taken after fasting for at least 8 hours.   BUN 22 (H) 6 - 20 mg/dL   Creatinine, Ser 8.85 0.61 - 1.24 mg/dL   Calcium 9.9 8.9 - 89.6 mg/dL   Total Protein 7.6 6.5 - 8.1 g/dL   Albumin 4.3 3.5 - 5.0 g/dL   AST 17 15 - 41 U/L   ALT 7 0 - 44 U/L   Alkaline Phosphatase 51 38 - 126 U/L  Total Bilirubin 0.3 0.0 - 1.2 mg/dL   GFR, Estimated >39 >39 mL/min    Comment: (NOTE) Calculated using the CKD-EPI Creatinine Equation (2021)    Anion gap 10 5 - 15    Comment: Performed at [facility], [address], [place], [place] [address]  Ethanol     Status: None   Collection Time: 04/18/24  9:48 PM  Result Value Ref Range   Alcohol , Ethyl (B) <15 <15 mg/dL    Comment: (NOTE) For medical purposes only. Performed at [facility], [address] [place], [place] [address]   Urine Drug Screen     Status: Abnormal   Collection Time: 04/19/24  9:17 AM  Result Value Ref Range   Opiates NEGATIVE NEGATIVE   Cocaine NEGATIVE NEGATIVE   Benzodiazepines NEGATIVE NEGATIVE   Amphetamines NEGATIVE NEGATIVE   Tetrahydrocannabinol POSITIVE (A) NEGATIVE   Barbiturates NEGATIVE NEGATIVE   Methadone Scn, Ur NEGATIVE NEGATIVE   Fentanyl  NEGATIVE NEGATIVE    Comment: (NOTE) Drug screen is for Medical Purposes only. Positive results  are preliminary only. If confirmation is needed, notify lab within 5 days.  Drug Class                 Cutoff (ng/mL) Amphetamine and metabolites 1000 Barbiturate and metabolites 200 Benzodiazepine              200 Opiates and metabolites     300 Cocaine and metabolites     300 THC                         50 Fentanyl                     5 Methadone                   300  Trazodone  is metabolized in vivo to several metabolites,  including pharmacologically active m-CPP, which is excreted in the  urine.  Immunoassay screens for amphetamines and MDMA have potential  cross-reactivity with these compounds and may provide false positive  result.  Performed at [facility], [address], [place], [place] [address]     Blood Alcohol  level:  Lab Results  Component Value Date   [facility] <15 04/18/2024   [facility] <15 02/22/2024    Metabolic Disorder Labs:  Lab Results  Component Value Date   HGBA1C 4.6 (L) 02/27/2024   MPG 85.32 02/27/2024   MPG 103 08/27/2014   No results found for: PROLACTIN Lab Results  Component Value Date   CHOL 201 (H) 02/27/2024   TRIG 76 02/27/2024   HDL 75 02/27/2024   CHOLHDL 2.7 02/27/2024   VLDL 15 02/27/2024   LDLCALC 111 (H) 02/27/2024    Current Medications: Current Facility-Administered Medications  Medication Dose Route Frequency Provider Last Rate Last Admin   alum & mag hydroxide-simeth (MAALOX/MYLANTA) 200-200-20 MG/5ML suspension 30 mL  30 mL Oral Q4H PRN White, Patrice L, NP       haloperidol  (HALDOL ) tablet 5 mg  5 mg Oral TID PRN White, Patrice L, NP       And   diphenhydrAMINE  (BENADRYL ) capsule 50 mg  50 mg Oral TID PRN White, Patrice L, NP       haloperidol  lactate (HALDOL ) injection 5 mg  5 mg Intramuscular TID PRN White, Patrice L, NP       And   diphenhydrAMINE  (BENADRYL ) injection 50 mg  50 mg Intramuscular TID PRN White, Patrice L, NP       And   LORazepam  (ATIVAN ) injection 2 mg  2 mg Intramuscular TID  PRN White, Patrice L, NP       haloperidol  lactate (HALDOL ) injection 10 mg  10 mg Intramuscular TID PRN White, Patrice L, NP       And   diphenhydrAMINE  (BENADRYL ) injection 50 mg  50 mg Intramuscular TID PRN White, Patrice L, NP       And   LORazepam  (ATIVAN ) injection 2 mg  2 mg Intramuscular TID PRN White, Patrice L, NP       feeding supplement (ENSURE PLUS HIGH PROTEIN) liquid 237 mL  237 mL Oral TID BM Jadapalle, Sree, MD   237 mL at 04/20/24 0900   hydrOXYzine  (ATARAX ) tablet 25 mg  25 mg Oral TID PRN White, Patrice L, NP       loperamide  (IMODIUM ) capsule 2-4 mg  2-4 mg Oral PRN White, Patrice L, NP       magnesium  hydroxide (MILK OF MAGNESIA) suspension 30 mL  30 mL Oral Daily PRN White, Patrice L, NP       multivitamin with minerals tablet 1 tablet  1 tablet Oral Daily White, Patrice L, NP   1 tablet at 04/20/24 0850   nicotine  (NICODERM CQ  - dosed in mg/24 hours) patch 14 mg  14 mg Transdermal Daily Jadapalle, Sree, MD   14 mg at 04/20/24 0850   ondansetron  (ZOFRAN -ODT) disintegrating tablet 4 mg  4 mg Oral Q6H PRN White, Patrice L, NP       thiamine  (VITAMIN B1) tablet 100 mg  100 mg Oral Daily White, Patrice L, NP   100 mg at 04/20/24 0850   traZODone  (DESYREL ) tablet 50 mg  50 mg Oral QHS PRN White, Patrice L, NP       PTA Medications: Medications Prior to Admission  Medication Sig Dispense Refill Last Dose/Taking   DULoxetine  (CYMBALTA ) 30 MG capsule Take 1 capsule (30 mg total) by mouth 2 (two) times daily. 60 capsule 1 Past Month   Multiple Vitamin (MULTIVITAMIN WITH MINERALS) TABS tablet Take 1 tablet by mouth daily. 30 tablet 0 Unknown   nicotine  (NICODERM CQ  - DOSED IN MG/24 HOURS) 14 mg/24hr patch Place 1 patch (14 mg total) onto the skin daily. 28 patch 0 Unknown   feeding supplement (ENSURE PLUS HIGH PROTEIN) LIQD Take 237 mLs by mouth 2 (two) times daily between meals. 14220 mL 0    ondansetron  (ZOFRAN ) 4 MG tablet Take 1 tablet (4 mg total) by mouth every 6 (six) hours  as needed for nausea. (Patient not taking: Reported on 04/20/2024) 20 tablet 0 Not Taking    Psychiatric Specialty Exam:  Presentation  General Appearance:  Appropriate for Environment  Eye Contact: Fair  Speech: Clear and Coherent  Speech Volume: Normal    Mood and Affect  Mood: Depressed  Affect: Congruent   Thought Process  Thought Processes: Coherent; Linear; Goal Directed  Descriptions of Associations:Intact  Orientation:Full (Time, Place and Person)  Thought Content:Logical  Hallucinations:Hallucinations: None  Ideas of Reference:None  Suicidal Thoughts:Suicidal Thoughts: Yes, Passive SI Passive Intent and/or Plan: Without Intent; Without Plan  Homicidal Thoughts:Homicidal Thoughts: No   Sensorium  Memory: Immediate Fair; Recent Fair; Remote Fair  Judgment: Impaired  Insight: Shallow   Executive Functions  Concentration: Fair  Attention Span: Fair  Recall: Fair  Fund of Knowledge: Fair  Language: Fair   Psychomotor Activity  Psychomotor Activity:Psychomotor  Activity: Normal   Assets  Assets: Communication Skills; Desire for Improvement    Musculoskeletal: Strength & Muscle Tone: within normal limits Gait & Station: normal  Physical Exam: Physical Exam Pulmonary:     Effort: Pulmonary effort is normal.  Neurological:     Mental Status: He is alert and oriented to person, place, and time.    Review of Systems  Respiratory:  Negative for shortness of breath.   Cardiovascular:  Negative for chest pain.  Neurological:  Negative for headaches.  Psychiatric/Behavioral:  Positive for depression and suicidal ideas. Negative for hallucinations.    Blood pressure 126/76, pulse (!) 54, temperature 98.3 F (36.8 C), temperature source Oral, resp. rate 18, height 5' 7 (1.702 m), weight 55.8 kg, SpO2 100%. Body mass index is 19.26 kg/m.  Principal Diagnosis: MDD (major depressive disorder), severe (HCC) Diagnosis:   Principal Problem:   MDD (major depressive disorder), severe (HCC)   Clinical Decision Making:  Treatment Plan Summary:  Safety and Monitoring:             -- Voluntary admission to inpatient psychiatric unit for safety, stabilization and treatment             -- Daily contact with patient to assess and evaluate symptoms and progress in treatment             -- Patient's case to be discussed in multi-disciplinary team meeting             -- Observation Level: q15 minute checks             -- Vital signs:  q12 hours             -- Precautions: suicide, elopement, and assault   2. Psychiatric Diagnoses and Treatment:  Patient declined medications at this time, patient does not currently meet criteria for non-emergent forced medications                 -- The risks/benefits/side-effects/alternatives to this medication were discussed in detail with the patient and time was given for questions. The patient consents to medication trial.                -- Metabolic profile and EKG monitoring obtained while on an atypical antipsychotic (BMI: Lipid Panel: HbgA1c: QTc:)              -- Encouraged patient to participate in unit milieu and in scheduled group therapies                            3. Medical Issues Being Addressed:  -No acute concerns reported    4. Discharge Planning:              -- Social work and case management to assist with discharge planning and identification of hospital follow-up needs prior to discharge             -- Estimated LOS: 5-7 days             -- Discharge Concerns: Need to establish a safety plan; Medication compliance and effectiveness             -- Discharge Goals: Return home with outpatient referrals follow ups  Physician Treatment Plan for Primary Diagnosis: MDD (major depressive disorder), severe (HCC) Long Term Goal(s): Improvement in symptoms so as ready for discharge  Short Term Goals: Ability to identify changes in lifestyle to reduce  recurrence of condition  will improve, Ability to verbalize feelings will improve, Ability to disclose and discuss suicidal ideas, Ability to demonstrate self-control will improve, Ability to identify and develop effective coping behaviors will improve, and Ability to maintain clinical measurements within normal limits will improve    I certify that inpatient services furnished can reasonably be expected to improve the patient's condition.    Zelda Sharps, NP This note was created using Dragon dictation software. Please excuse any inadvertent transcription errors. Case was discussed with supervising physician Dr. Jadapalle who is agreeable with current plan.       [1]  Allergies Allergen Reactions   Aspirin Hives and Nausea And Vomiting   Ibuprofen  Hives and Nausea And Vomiting   Porcine (Pork) Protein-Containing Drug Products Other (See Comments)    Stomach pain. Pt does not eat pork   Acetaminophen  Nausea Only   Sertraline  Diarrhea and Other (See Comments)    Acute GI distress with diarrhea and pain.

## 2024-04-20 NOTE — Plan of Care (Signed)
  Problem: Education: Goal: Knowledge of [facility] Education information/materials will improve Outcome: Progressing Goal: Emotional status will improve Outcome: Progressing Goal: Mental status will improve Outcome: Progressing Goal: Verbalization of understanding the information provided will improve Outcome: Progressing   Problem: Activity: Goal: Interest or engagement in activities will improve Outcome: Progressing Goal: Sleeping patterns will improve Outcome: Progressing   Problem: Coping: Goal: Ability to verbalize frustrations and anger appropriately will improve Outcome: Progressing Goal: Ability to demonstrate self-control will improve Outcome: Progressing   Problem: Health Behavior/Discharge Planning: Goal: Identification of resources available to assist in meeting health care needs will improve Outcome: Progressing Goal: Compliance with treatment plan for underlying cause of condition will improve Outcome: Progressing   Problem: Physical Regulation: Goal: Ability to maintain clinical measurements within normal limits will improve Outcome: Progressing   Problem: Safety: Goal: Periods of time without injury will increase Outcome: Progressing   Problem: Education: Goal: Ability to make informed decisions regarding treatment will improve Outcome: Progressing   Problem: Coping: Goal: Coping ability will improve Outcome: Progressing   Problem: Health Behavior/Discharge Planning: Goal: Identification of resources available to assist in meeting health care needs will improve Outcome: Progressing   Problem: Medication: Goal: Compliance with prescribed medication regimen will improve Outcome: Progressing   Problem: Self-Concept: Goal: Ability to disclose and discuss suicidal ideas will improve Outcome: Progressing Goal: Will verbalize positive feelings about self Outcome: Progressing Note: Patient is initiating therapy. Patient will work on increased  adherence    Problem: Education: Goal: Utilization of techniques to improve thought processes will improve Outcome: Progressing Goal: Knowledge of the prescribed therapeutic regimen will improve Outcome: Progressing   Problem: Activity: Goal: Interest or engagement in leisure activities will improve Outcome: Progressing Goal: Imbalance in normal sleep/wake cycle will improve Outcome: Progressing   Problem: Coping: Goal: Coping ability will improve Outcome: Progressing Goal: Will verbalize feelings Outcome: Progressing   Problem: Health Behavior/Discharge Planning: Goal: Ability to make decisions will improve Outcome: Progressing Goal: Compliance with therapeutic regimen will improve Outcome: Progressing   Problem: Role Relationship: Goal: Will demonstrate positive changes in social behaviors and relationships Outcome: Progressing   Problem: Safety: Goal: Ability to disclose and discuss suicidal ideas will improve Outcome: Progressing Goal: Ability to identify and utilize support systems that promote safety will improve Outcome: Progressing   Problem: Self-Concept: Goal: Will verbalize positive feelings about self Outcome: Progressing Goal: Level of anxiety will decrease Outcome: Progressing

## 2024-04-20 NOTE — Group Note (Signed)
 Date:  04/20/2024 Time:  10:11 AM  Group Topic/Focus:  Emotional Education:   The focus of this group is to discuss what feelings/emotions are, and how they are experienced.    Participation Level:  Active  Participation Quality:  Appropriate  Affect:  Appropriate  Cognitive:  Appropriate  Insight: Appropriate  Engagement in Group:  Engaged  Modes of Intervention:  Activity  Additional Comments:    Camellia HERO Nicanor Mendolia 04/20/2024, 10:11 AM

## 2024-04-20 NOTE — Progress Notes (Signed)
 NUTRITION ASSESSMENT  Pt identified as at risk on the Malnutrition Screen Tool  INTERVENTION:  -Continue MVI with minerals daily -Continue regular diet -Ensure Plus High Protein po TID, each supplement provides 350 kcal and 20 grams of protein   NUTRITION DIAGNOSIS: Unintentional weight loss related to sub-optimal intake as evidenced by pt report.   Goal: Pt to meet >/= 90% of their estimated nutrition needs.  Monitor:  PO intake  Assessment:  53 y.o. male with history of alcohol  abuse, colitis, depression, chronic back pain.  Presents complaining of suicidal ideation   Patient admitted with SI.   Per H&P, patient with a lot of family and financial stressors; recently lost several family members.   Patient currently on a regular diet. No meal completion data available to assess at this time.   Reviewed weight history; patient has experienced a 12.1% weight loss over the past 2 months, which is significant for time frame. Patient is at high risk for malnutrition given recent significant weight loss and history of substance abuse. RD unable to identify malnutrition at this time. He would benefit from addition of oral nutrition supplements.   Medications reviewed and include thiamine .   Labs reviewed: Ethyl alcohol : <15. Tox screen positive for tetrahydrocannabinol.    54 y.o. male  Height: Ht Readings from Last 1 Encounters:  04/19/24 5' 7 (1.702 m)    Weight: Wt Readings from Last 1 Encounters:  04/19/24 55.8 kg    Weight Hx: Wt Readings from Last 10 Encounters:  04/19/24 55.8 kg  03/07/24 56.5 kg  02/23/24 51.7 kg  02/22/24 64 kg  02/15/24 63.5 kg  02/12/24 63.5 kg  11/23/23 52.2 kg  11/23/23 52.2 kg  06/30/23 54 kg  06/29/23 62.1 kg    BMI:  Body mass index is 19.26 kg/m. BMI WDL.   Estimated Nutritional Needs: Kcal: 25-30 kcal/kg Protein: > 1 gram protein/kg Fluid: 1 ml/kcal  Diet Order:  Diet Order             Diet regular Room service  appropriate? Yes; Fluid consistency: Thin  Diet effective now                  Pt is also offered choice of unit snacks mid-morning and mid-afternoon.  Pt is eating as desired.   Lab results and medications reviewed.   Margery ORN, RD, LDN, CDCES Registered Dietitian III Certified Diabetes Care and Education Specialist If unable to reach this RD, please use RD Inpatient group chat on secure chat between hours of 8am-4 pm daily

## 2024-04-20 NOTE — Group Note (Signed)
 Date:  04/20/2024 Time:  9:25 PM  Group Topic/Focus:  Managing Feelings:   The focus of this group is to identify what feelings patients have difficulty handling and develop a plan to handle them in a healthier way upon discharge.    Participation Level:  Active  Participation Quality:  Appropriate  Affect:  Appropriate  Cognitive:  Appropriate  Insight: Appropriate  Engagement in Group:  Engaged  Modes of Intervention:  Discussion and Education  Additional Comments:    Tziporah Knoke L 04/20/2024, 9:25 PM

## 2024-04-20 NOTE — Progress Notes (Signed)
°   04/20/24 1200  CIWA-Ar  Nausea and Vomiting 0  Tactile Disturbances 0  Tremor 0  Auditory Disturbances 0  Paroxysmal Sweats 0  Visual Disturbances 0  Anxiety 0  Headache, Fullness in Head 0  Agitation 0  Orientation and Clouding of Sensorium 0  CIWA-Ar Total 0   Patient declined any symptoms of alcohol  withdrawal stating that he hasn't had a drink in twelve years. This clinical research associate spoke with providers and they are ok with CIWA protocol being discontinued.

## 2024-04-20 NOTE — Progress Notes (Signed)
°   04/20/24 1100  Psych Admission Type (Psych Patients Only)  Admission Status Voluntary  Psychosocial Assessment  Patient Complaints Sleep disturbance (patient stated y'all froze me all night, which is why he couldn't sleep.)  Eye Contact Fair;Watchful  Facial Expression Anxious;Worried  Affect Preoccupied (preoccupied with getting the heat in his room turned up)  Speech Logical/coherent  Interaction Assertive  Motor Activity Slow  Appearance/Hygiene Layered clothes;Unremarkable  Behavior Characteristics Cooperative;Appropriate to situation  Mood Pleasant  Aggressive Behavior  Effect No apparent injury  Thought Process  Coherency WDL  Content WDL  Delusions None reported or observed  Perception WDL  Hallucination None reported or observed  Judgment WDL  Confusion None  Danger to Self  Current suicidal ideation? Denies  Self-Injurious Behavior No self-injurious ideation or behavior indicators observed or expressed   Agreement Not to Harm Self Yes  Description of Agreement Verbal  Danger to Others  Danger to Others None reported or observed   Patient's goal for today, per his self-inventory is getting better and going home, in which he will attend all groups or whatever it takes in order to achieve his goal.

## 2024-04-21 DIAGNOSIS — F322 Major depressive disorder, single episode, severe without psychotic features: Secondary | ICD-10-CM | POA: Diagnosis not present

## 2024-04-21 NOTE — Progress Notes (Signed)
" °   04/21/24 1500  Psych Admission Type (Psych Patients Only)  Admission Status Voluntary  Psychosocial Assessment  Patient Complaints None (patient denies any complaints stating I'm just ready to go.)  Eye Contact Fair;Watchful  Facial Expression Anxious;Worried  Affect Appropriate to circumstance  Surveyor, Minerals Activity Slow  Appearance/Hygiene Layered clothes;Unremarkable  Behavior Characteristics Cooperative;Appropriate to situation  Mood Pleasant (patient's goal for today, per his self-inventory is going home.)  Aggressive Behavior  Effect No apparent injury  Thought Process  Coherency WDL  Content WDL  Delusions None reported or observed  Perception WDL  Hallucination None reported or observed  Judgment WDL  Confusion None  Danger to Self  Current suicidal ideation? Denies  Self-Injurious Behavior No self-injurious ideation or behavior indicators observed or expressed   Agreement Not to Harm Self Yes  Description of Agreement Verbal  Danger to Others  Danger to Others None reported or observed    "

## 2024-04-21 NOTE — Progress Notes (Signed)
" °   04/20/24 2100  Psych Admission Type (Psych Patients Only)  Admission Status Voluntary  Psychosocial Assessment  Patient Complaints None  Eye Contact Fair  Facial Expression Anxious  Affect Preoccupied  Speech Logical/coherent  Interaction Assertive  Motor Activity Other (Comment) (WDL)  Appearance/Hygiene Layered clothes;Unremarkable  Behavior Characteristics Appropriate to situation;Cooperative  Mood Pleasant  Aggressive Behavior  Effect No apparent injury  Thought Process  Coherency WDL  Content WDL  Delusions None reported or observed  Perception WDL  Hallucination None reported or observed  Judgment Limited  Confusion None  Danger to Self  Current suicidal ideation? Denies  Agreement Not to Harm Self Yes  Description of Agreement Verbal  Danger to Others  Danger to Others None reported or observed    "

## 2024-04-21 NOTE — Group Note (Signed)
 Date:  04/21/2024 Time:  7:14 PM  Group Topic/Focus:  Wellness Toolbox:   The focus of this group is to discuss various aspects of wellness, balancing those aspects and exploring ways to increase the ability to experience wellness.  Patients will create a wellness toolbox for use upon discharge.    Participation Level:  Did Not Attend   Deitra Clap [facility] 04/21/2024, 7:14 PM

## 2024-04-21 NOTE — [facility] Counselor (Signed)
 Adult Comprehensive Assessment  Patient ID: [name], male   DOB: [birth year], 53 y.o.   MRN: [id number]  Information Source: Information source: Patient  Current Stressors:  Patient states their primary concerns and needs for treatment are:: Regular procedure. I thought I was having an anxiety attack. It was hard for me to breathe. Educational / Learning stressors: None reported. Employment / Job issues: It's been super super slow. Family Relationships: Patient denied. Financial / Lack of resources (include bankruptcy): Everything is so high now, I'm struggling. Housing / Lack of housing: I'm behind on my bills. Physical health (include injuries & life threatening diseases): Patient reported leg and back pain. Social relationships: I don't really have a lot of friends. Substance abuse: I smoke a lot of weed. Patient reported daily marijuana use. Bereavement / Loss: My daughter died from COVID in 15-May-2017.  Living/Environment/Situation:  Living Arrangements: Alone Living conditions (as described by patient or guardian): WNL Who else lives in the home?: I live alone. How long has patient lived in current situation?: Over 8 years. What is atmosphere in current home: Comfortable  Family History:  Marital status: Single Are you sexually active?: Yes What is your sexual orientation?: heterosexual Has your sexual activity been affected by drugs, alcohol , medication, or emotional stress?: Patient denied. Does patient have children?: Yes How is patient's relationship with their children?: 2 deceased and 2 living. Patient reported having a beautiful relaitonship with his children.  Childhood History:  By whom was/is the patient raised?: Both parents Description of patient's relationship with caregiver when they were a child: Spoiled. Patient's description of current relationship with people who raised him/her: Patient reported that his parents are deceased. How  were you disciplined when you got in trouble as a child/adolescent?: Patient reported spankings and being punished. Does patient have siblings?: Yes Number of Siblings: 3 Description of patient's current relationship with siblings: Good Did patient suffer any verbal/emotional/physical/sexual abuse as a child?: No Did patient suffer from severe childhood neglect?: No Has patient ever been sexually abused/assaulted/raped as an adolescent or adult?: No Was the patient ever a victim of a crime or a disaster?: No Witnessed domestic violence?: No Has patient been affected by domestic violence as an adult?: No  Education:  Highest grade of school patient has completed: !1th grade. 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 Are You Satisfied With Your Job?: Yes Do You Work More Than One Job?: Yes Work Stressors: I'm not working the way I was. Patient's Job has Been Impacted by Current Illness: No What is the Longest Time Patient has Held a Job?: 7-8 years. 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 Has Patient ever Been in the [employer]?: No  Financial Resources:   Financial resources: Medicaid Does patient have a representative payee or guardian?: No  Alcohol /Substance Abuse:   What has been your use of drugs/alcohol  within the last 12 months?: Patient reported daily marijuana use. If attempted suicide, did drugs/alcohol  play a role in this?: Yes Alcohol /Substance Abuse Treatment Hx: Past Tx, Inpatient If yes, describe treatment: Patient reported attending [facility] in 05-15-2013. Has alcohol /substance abuse ever caused legal problems?: No  Social Support System:   Forensic Psychologist System: None Describe Community Support System: I don't have anyone. Type of faith/religion: Yes. How does patient's faith help to cope with  current illness?: I pray and I read.  Leisure/Recreation:   Do You  Have Hobbies?: Yes Leisure and Hobbies: Reading.  Strengths/Needs:   What is the patient's perception of their strengths?: I'm patient. Patient states they can use these personal strengths during their treatment to contribute to their recovery: I don't know. Patient states these barriers may affect/interfere with their treatment: None reported. Patient states these barriers may affect their return to the community: None reportred. Other important information patient would like considered in planning for their treatment: Patient would like therapy and psychiatry.  Discharge Plan:   Currently receiving community mental health services: No Patient states concerns and preferences for aftercare planning are: Patient would like therapy and psychiatry. Patient states they will know when they are safe and ready for discharge when: I'm not sure. Does patient have access to transportation?: No Does patient have financial barriers related to discharge medications?: No Plan for no access to transportation at discharge: CSW to assist with transportation needs. Will patient be returning to same living situation after discharge?: Yes  Summary/Recommendations:   Summary and Recommendations (to be completed by the evaluator): Patient is a 53 year old male from [place], [place] [facility] [place]) with a history of depression who presented voluntarily to the [facility] ED for an assessment according to chart. During assessment with this clinical research associate, patient reported Regular procedure. I thought I was having an anxiety attack. It was hard for me to breathe. Patient reported that he has been working side jobs and when asked of employment stressors reported It's been super super slow. When asked of financial stressors, patient reported Everything is so high now, I'm struggling. Patient currently resides alone and described the atmosphere as  comfortable and when asked of housing stressors reported, I'm behind on my bills. When asked of bereavement stressors patient reported, My daughter died from COVID in 05-06-17. Patient endorsed daily marijuana use. Patient reported that he has poor support. Patient is not currently followed by a therapist or psychiatrist but is open to a referral at discharge. Patient denied SI, HI and AVH. Patients current diagnosis is MDD (major depressive disorder), severe (HCC). Recommendations include: crisis stabilization, therapeutic milieu, encourage group attendance and participation, medication management for mood stabilization and development of a comprehensive mental wellness/sobriety plan.  Nithila Sumners M Rukaya Kleinschmidt. 04/21/2024

## 2024-04-21 NOTE — BH IP Treatment Plan (Signed)
 Interdisciplinary Treatment and Diagnostic Plan Update  04/21/2024 Time of Session: 11:37 [name] MRN: [id number]  Principal Diagnosis: MDD (major depressive disorder), severe (HCC)  Secondary Diagnoses: Principal Problem:   MDD (major depressive disorder), severe (HCC)   Current Medications:  Current Facility-Administered Medications  Medication Dose Route Frequency Provider Last Rate Last Admin   alum & mag hydroxide-simeth (MAALOX/MYLANTA) 200-200-20 MG/5ML suspension 30 mL  30 mL Oral Q4H PRN White, Patrice L, NP       haloperidol  (HALDOL ) tablet 5 mg  5 mg Oral TID PRN White, Patrice L, NP       And   diphenhydrAMINE  (BENADRYL ) capsule 50 mg  50 mg Oral TID PRN White, Patrice L, NP       haloperidol  lactate (HALDOL ) injection 5 mg  5 mg Intramuscular TID PRN White, Patrice L, NP       And   diphenhydrAMINE  (BENADRYL ) injection 50 mg  50 mg Intramuscular TID PRN White, Patrice L, NP       And   LORazepam  (ATIVAN ) injection 2 mg  2 mg Intramuscular TID PRN White, Patrice L, NP       haloperidol  lactate (HALDOL ) injection 10 mg  10 mg Intramuscular TID PRN White, Patrice L, NP       And   diphenhydrAMINE  (BENADRYL ) injection 50 mg  50 mg Intramuscular TID PRN White, Patrice L, NP       And   LORazepam  (ATIVAN ) injection 2 mg  2 mg Intramuscular TID PRN White, Patrice L, NP       feeding supplement (ENSURE PLUS HIGH PROTEIN) liquid 237 mL  237 mL Oral TID BM Jadapalle, Sree, MD   237 mL at 04/20/24 1500   hydrOXYzine  (ATARAX ) tablet 25 mg  25 mg Oral TID PRN White, Patrice L, NP       loperamide  (IMODIUM ) capsule 2-4 mg  2-4 mg Oral PRN White, Patrice L, NP       magnesium  hydroxide (MILK OF MAGNESIA) suspension 30 mL  30 mL Oral Daily PRN White, Patrice L, NP       multivitamin with minerals tablet 1 tablet  1 tablet Oral Daily White, Patrice L, NP   1 tablet at 04/21/24 0827   nicotine  (NICODERM CQ  - dosed in mg/24 hours) patch 14 mg  14 mg Transdermal Daily Jadapalle,  Sree, MD   14 mg at 04/20/24 0850   ondansetron  (ZOFRAN -ODT) disintegrating tablet 4 mg  4 mg Oral Q6H PRN White, Patrice L, NP       thiamine  (VITAMIN B1) tablet 100 mg  100 mg Oral Daily White, Patrice L, NP   100 mg at 04/21/24 0827   traZODone  (DESYREL ) tablet 50 mg  50 mg Oral QHS PRN White, Patrice L, NP       PTA Medications: Medications Prior to Admission  Medication Sig Dispense Refill Last Dose/Taking   DULoxetine  (CYMBALTA ) 30 MG capsule Take 1 capsule (30 mg total) by mouth 2 (two) times daily. 60 capsule 1 Past Month   Multiple Vitamin (MULTIVITAMIN WITH MINERALS) TABS tablet Take 1 tablet by mouth daily. 30 tablet 0 Unknown   nicotine  (NICODERM CQ  - DOSED IN MG/24 HOURS) 14 mg/24hr patch Place 1 patch (14 mg total) onto the skin daily. 28 patch 0 Unknown   feeding supplement (ENSURE PLUS HIGH PROTEIN) LIQD Take 237 mLs by mouth 2 (two) times daily between meals. 14220 mL 0    ondansetron  (ZOFRAN ) 4 MG tablet Take 1 tablet (4 mg  total) by mouth every 6 (six) hours as needed for nausea. (Patient not taking: Reported on 04/20/2024) 20 tablet 0 Not Taking    Patient Stressors: Financial difficulties    Patient Strengths: Capable of independent living  Communication skills  Supportive family/friends   Treatment Modalities: Medication Management, Group therapy, Case management,  1 to 1 session with clinician, Psychoeducation, Recreational therapy.   Physician Treatment Plan for Primary Diagnosis: MDD (major depressive disorder), severe (HCC) Long Term Goal(s): Improvement in symptoms so as ready for discharge   Short Term Goals: Ability to identify changes in lifestyle to reduce recurrence of condition will improve Ability to verbalize feelings will improve Ability to disclose and discuss suicidal ideas Ability to demonstrate self-control will improve Ability to identify and develop effective coping behaviors will improve Ability to maintain clinical measurements within  normal limits will improve  Medication Management: Evaluate patient's response, side effects, and tolerance of medication regimen.  Therapeutic Interventions: 1 to 1 sessions, Unit Group sessions and Medication administration.  Evaluation of Outcomes: Progressing  Physician Treatment Plan for Secondary Diagnosis: Principal Problem:   MDD (major depressive disorder), severe (HCC)  Long Term Goal(s): Improvement in symptoms so as ready for discharge   Short Term Goals: Ability to identify changes in lifestyle to reduce recurrence of condition will improve Ability to verbalize feelings will improve Ability to disclose and discuss suicidal ideas Ability to demonstrate self-control will improve Ability to identify and develop effective coping behaviors will improve Ability to maintain clinical measurements within normal limits will improve     Medication Management: Evaluate patient's response, side effects, and tolerance of medication regimen.  Therapeutic Interventions: 1 to 1 sessions, Unit Group sessions and Medication administration.  Evaluation of Outcomes: Progressing   RN Treatment Plan for Primary Diagnosis: MDD (major depressive disorder), severe (HCC) Long Term Goal(s): Knowledge of disease and therapeutic regimen to maintain health will improve  Short Term Goals: Ability to remain free from injury will improve, Ability to verbalize frustration and anger appropriately will improve, Ability to demonstrate self-control, Ability to participate in decision making will improve, Ability to verbalize feelings will improve, Ability to disclose and discuss suicidal ideas, Ability to identify and develop effective coping behaviors will improve, and Compliance with prescribed medications will improve  Medication Management: RN will administer medications as ordered by provider, will assess and evaluate patient's response and provide education to patient for prescribed medication. RN will  report any adverse and/or side effects to prescribing provider.  Therapeutic Interventions: 1 on 1 counseling sessions, Psychoeducation, Medication administration, Evaluate responses to treatment, Monitor vital signs and CBGs as ordered, Perform/monitor CIWA, COWS, AIMS and Fall Risk screenings as ordered, Perform wound care treatments as ordered.  Evaluation of Outcomes: Progressing   LCSW Treatment Plan for Primary Diagnosis: MDD (major depressive disorder), severe (HCC) Long Term Goal(s): Safe transition to appropriate next level of care at discharge, Engage patient in therapeutic group addressing interpersonal concerns.  Short Term Goals: Engage patient in aftercare planning with referrals and resources, Increase social support, Increase ability to appropriately verbalize feelings, Increase emotional regulation, Facilitate acceptance of mental health diagnosis and concerns, Facilitate patient progression through stages of change regarding substance use diagnoses and concerns, Identify triggers associated with mental health/substance abuse issues, and Increase skills for wellness and recovery  Therapeutic Interventions: Assess for all discharge needs, 1 to 1 time with Social worker, Explore available resources and support systems, Assess for adequacy in community support network, Educate family and significant other(s) on suicide  prevention, Complete Psychosocial Assessment, Interpersonal group therapy.  Evaluation of Outcomes: Progressing   Progress in Treatment: Attending groups: Yes. Participating in groups: Yes. Taking medication as prescribed: Yes. Toleration medication: Yes. Family/Significant other contact made: No, will contact:  when given permission.  Patient understands diagnosis: Yes. Discussing patient identified problems/goals with staff: Yes. Medical problems stabilized or resolved: Yes. Denies suicidal/homicidal ideation: Yes. Issues/concerns per patient self-inventory:  No. Other: none.  New problem(s) identified: No, Describe:  none identified.  New Short Term/Long Term Goal(s): medication management for mood stabilization; elimination of SI thoughts; development of comprehensive mental wellness/sobriety plan.  Patient Goals:  To go home and go back to work.   Discharge Plan or Barriers: CSW will assist pt with development of an appropriate aftercare/discharge plan.  Reason for Continuation of Hospitalization: Depression Medication stabilization Suicidal ideation  Estimated Length of Stay: 1-7 days  Last 3 [place] Suicide Severity Risk Score: Flowsheet Row Admission (Current) from 04/19/2024 in [facility] INPATIENT BEHAVIORAL MEDICINE ED from 04/18/2024 in [facility] Emergency Department at [facility] Clinical Support from 03/07/2024 in [facility]  C-SSRS RISK CATEGORY Low Risk Error: Q7 should not be populated when Q6 is No High Risk    Last PHQ 2/9 Scores:    03/07/2024   10:15 AM 04/13/2023    2:25 PM 10/14/2021    8:22 AM  Depression screen PHQ 2/9  Decreased Interest -- 1 1  Down, Depressed, Hopeless -- 3 3  PHQ - 2 Score  4 4  Altered sleeping  0 2  Tired, decreased energy  0 2  Change in appetite  0 1  Feeling bad or failure about yourself   3 3  Trouble concentrating  1 1  Moving slowly or fidgety/restless  1 0  Suicidal thoughts  2 1  PHQ-9 Score  11  14   Difficult doing work/chores  Very difficult Somewhat difficult     Data saved with a previous flowsheet row definition    Scribe for Treatment Team: Nadara JONELLE Fam, LCSW 04/21/2024 1:10 PM

## 2024-04-21 NOTE — Progress Notes (Signed)
 [facility] MD Progress Note  04/21/2024 11:23 PM [name]  MRN:  [id number]  From Initial presentation/Admission:   Patient is a 53 year-old male admitted to inpatient psychiatric unit endorsing suicidal thoughts. Patient reports feeling like he had a breakdown. Patient describes sudden, intrusive urge to drink alcohol  after 13 years of sobriety while alone at home. This urge significantly alarmed patient given past ideation of drinking himself to death. Fear of relapse combined with worsening depression prompted patient to seek psychiatric care.  Subjective:  Chart reviewed, case discussed in multidisciplinary meeting, patient seen during rounds.   12/19:    Past Psychiatric History: see h&P Family History:  Family History  Problem Relation Age of Onset   Hypertension Mother    Hypertension Father    Hypertension Other    Social History:  Social History   Substance and Sexual Activity  Alcohol  Use No     Social History   Substance and Sexual Activity  Drug Use Yes   Types: Marijuana    Social History   Socioeconomic History   Marital status: Single    Spouse name: Not on file   Number of children: Not on file   Years of education: Not on file   Highest education level: Not on file  Occupational History   Not on file  Tobacco Use   Smoking status: Every Day    Types: Cigarettes   Smokeless tobacco: Never  Vaping Use   Vaping status: Never Used  Substance and Sexual Activity   Alcohol  use: No   Drug use: Yes    Types: Marijuana   Sexual activity: Yes    Birth control/protection: Condom  Other Topics Concern   Not on file  Social History Narrative   Not on file   Social Drivers of Health   Tobacco Use: High Risk (04/19/2024)   Patient History    Smoking Tobacco Use: Every Day    Smokeless Tobacco Use: Never    Passive Exposure: Not on file  Financial Resource Strain: Not on file  Food Insecurity: Food Insecurity Present (04/19/2024)   Epic     Worried About Programme Researcher, Broadcasting/film/video in the Last Year: Often true    Barista in the Last Year: Often true  Transportation Needs: Unmet Transportation Needs (04/19/2024)   Epic    Lack of Transportation (Medical): Yes    Lack of Transportation (Non-Medical): Yes  Physical Activity: Not on file  Stress: Not on file  Social Connections: Patient Declined (06/30/2023)   Social Connection and Isolation Panel    Frequency of Communication with Friends and Family: Patient declined    Frequency of Social Gatherings with Friends and Family: Patient declined    Attends Religious Services: Patient declined    Active Member of Clubs or Organizations: Patient declined    Attends Banker Meetings: Patient declined    Marital Status: Patient declined  Depression (PHQ2-9): High Risk (04/13/2023)   Depression (PHQ2-9)    PHQ-2 Score: 11  Alcohol  Screen: Low Risk (04/19/2024)   Alcohol  Screen    Last Alcohol  Screening Score (AUDIT): 2  Housing: Unknown (04/19/2024)   Epic    Unable to Pay for Housing in the Last Year: Patient declined    Number of Times Moved in the Last Year: 0    Homeless in the Last Year: No  Recent Concern: Housing - High Risk (02/23/2024)   Epic    Unable to Pay for Housing in the Last  Year: Yes    Number of Times Moved in the Last Year: 0    Homeless in the Last Year: No  Utilities: At Risk (04/19/2024)   Epic    Threatened with loss of utilities: Yes  Health Literacy: Not on file   Past Medical History:  Past Medical History:  Diagnosis Date   Alcohol  abuse    Anemia    Colitis    Depression    Severe major depression with psychotic features (HCC)    History reviewed. No pertinent surgical history.  Current Medications: Current Facility-Administered Medications  Medication Dose Route Frequency Provider Last Rate Last Admin   alum & mag hydroxide-simeth (MAALOX/MYLANTA) 200-200-20 MG/5ML suspension 30 mL  30 mL Oral Q4H PRN White, Patrice L, NP        haloperidol  (HALDOL ) tablet 5 mg  5 mg Oral TID PRN White, Patrice L, NP       And   diphenhydrAMINE  (BENADRYL ) capsule 50 mg  50 mg Oral TID PRN White, Patrice L, NP       haloperidol  lactate (HALDOL ) injection 5 mg  5 mg Intramuscular TID PRN White, Patrice L, NP       And   diphenhydrAMINE  (BENADRYL ) injection 50 mg  50 mg Intramuscular TID PRN White, Patrice L, NP       And   LORazepam  (ATIVAN ) injection 2 mg  2 mg Intramuscular TID PRN White, Patrice L, NP       haloperidol  lactate (HALDOL ) injection 10 mg  10 mg Intramuscular TID PRN White, Patrice L, NP       And   diphenhydrAMINE  (BENADRYL ) injection 50 mg  50 mg Intramuscular TID PRN White, Patrice L, NP       And   LORazepam  (ATIVAN ) injection 2 mg  2 mg Intramuscular TID PRN White, Patrice L, NP       feeding supplement (ENSURE PLUS HIGH PROTEIN) liquid 237 mL  237 mL Oral TID BM Jadapalle, Sree, MD   237 mL at 04/21/24 2100   hydrOXYzine  (ATARAX ) tablet 25 mg  25 mg Oral TID PRN White, Patrice L, NP       loperamide  (IMODIUM ) capsule 2-4 mg  2-4 mg Oral PRN White, Patrice L, NP       magnesium  hydroxide (MILK OF MAGNESIA) suspension 30 mL  30 mL Oral Daily PRN White, Patrice L, NP       multivitamin with minerals tablet 1 tablet  1 tablet Oral Daily White, Patrice L, NP   1 tablet at 04/21/24 0827   ondansetron  (ZOFRAN -ODT) disintegrating tablet 4 mg  4 mg Oral Q6H PRN White, Patrice L, NP       thiamine  (VITAMIN B1) tablet 100 mg  100 mg Oral Daily White, Patrice L, NP   100 mg at 04/21/24 0827   traZODone  (DESYREL ) tablet 50 mg  50 mg Oral QHS PRN White, Patrice L, NP        Lab Results: No results found for this or any previous visit (from the past 48 hours).  Blood Alcohol  level:  Lab Results  Component Value Date   [facility] <15 04/18/2024   ETH <15 02/22/2024    Metabolic Disorder Labs: Lab Results  Component Value Date   HGBA1C 4.6 (L) 02/27/2024   MPG 85.32 02/27/2024   MPG 103 08/27/2014   No results found  for: PROLACTIN Lab Results  Component Value Date   CHOL 201 (H) 02/27/2024   TRIG 76 02/27/2024   HDL  75 02/27/2024   CHOLHDL 2.7 02/27/2024   VLDL 15 02/27/2024   LDLCALC 111 (H) 02/27/2024    Physical Findings: AIMS:  , ,  ,  ,    CIWA:  CIWA-Ar Total: 0 COWS:      Psychiatric Specialty Exam:  Presentation  General Appearance:  Appropriate for Environment; Casual  Eye Contact: Fair  Speech: Clear and Coherent; Normal Rate  Speech Volume: Normal    Mood and Affect  Mood: Euthymic  Affect: Appropriate   Thought Process  Thought Processes: Coherent; Goal Directed; Linear  Orientation:Full (Time, Place and Person)  Thought Content:Logical  Hallucinations:Hallucinations: None  Ideas of Reference:None  Suicidal Thoughts:Suicidal Thoughts: No SI Passive Intent and/or Plan: Without Intent; Without Plan  Homicidal Thoughts:Homicidal Thoughts: No   Sensorium  Memory: Immediate Fair; Recent Fair; Remote Fair  Judgment: Poor  Insight: Poor   Executive Functions  Concentration: Fair  Attention Span: Fair  Recall: [employer] of Knowledge: Fair  Language: Fair   Psychomotor Activity  Psychomotor Activity: Psychomotor Activity: Normal  Musculoskeletal: Strength & Muscle Tone: {desc; muscle tone:32375} Gait & Station: {PE GAIT ED WJUO:77474} Assets  Assets: Communication Skills; Desire for Improvement    Physical Exam: Physical Exam Vitals and nursing note reviewed.  Constitutional:      Appearance: Normal appearance.  Cardiovascular:     Rate and Rhythm: Normal rate.  Pulmonary:     Effort: Pulmonary effort is normal.  Neurological:     Mental Status: He is alert and oriented to person, place, and time.  Psychiatric:        Mood and Affect: Mood normal.        Behavior: Behavior normal.    Review of Systems  Respiratory:  Negative for shortness of breath.   Cardiovascular:  Negative for chest pain.   Gastrointestinal:  Negative for constipation, nausea and vomiting.  Psychiatric/Behavioral:  Positive for depression and substance abuse. Negative for hallucinations and suicidal ideas. The patient is nervous/anxious.   All other systems reviewed and are negative.  Blood pressure 131/70, pulse 64, temperature 99 F (37.2 C), temperature source Oral, resp. rate 16, height 5' 7 (1.702 m), weight 55.8 kg, SpO2 100%. Body mass index is 19.26 kg/m.  Diagnosis: Principal Problem:   MDD (major depressive disorder), severe (HCC)   PLAN: Safety and Monitoring:  -- Voluntary admission to inpatient psychiatric unit for safety, stabilization and treatment  -- Daily contact with patient to assess and evaluate symptoms and progress in treatment  -- Patient's case to be discussed in multi-disciplinary team meeting  -- Observation Level : q15 minute checks  -- Vital signs:  q12 hours  -- Precautions: suicide, elopement, and assault -- Encouraged patient to participate in unit milieu and in scheduled group therapies  2. Psychiatric Treatment:  Scheduled Medications: Discussed several different SSRIs (specifically prozac  and lexapro) - patient to consider and discuss further tomorrow   -- The risks/benefits/side-effects/alternatives to this medication were discussed in detail with the patient and time was given for questions. The patient consents to medication trial.  3. Medical Issues Being Addressed:  No issues or concerns expressed   4. Discharge Planning:   -- Social work and case management to assist with discharge planning and identification of hospital follow-up needs prior to discharge  -- Estimated LOS: 3-4 days  Will need PCP appointment (discussed this is how he discussed further management with pain management).   Lakenya Riendeau, NP 04/21/2024, 11:23 PM

## 2024-04-21 NOTE — Group Note (Signed)
 Date:  04/21/2024 Time:  10:38 AM  Group Topic/Focus:  Goals Group:   The focus of this group is to help patients establish daily goals to achieve during treatment and discuss how the patient can incorporate goal setting into their daily lives to aide in recovery.    Participation Level:  Did Not Attend   Deitra Clap [facility] 04/21/2024, 10:38 AM

## 2024-04-21 NOTE — Group Note (Signed)
 Date:  04/21/2024 Time:  8:58 PM  Group Topic/Focus:  Coping With Mental Health Crisis:   The purpose of this group is to help patients identify strategies for coping with mental health crisis.  Group discusses possible causes of crisis and ways to manage them effectively. Developing a Wellness Toolbox:   The focus of this group is to help patients develop a wellness toolbox with skills and strategies to promote recovery upon discharge.    Participation Level:  Active  Participation Quality:  Appropriate  Affect:  Appropriate  Cognitive:  Alert  Insight: Appropriate  Engagement in Group:  Engaged  Modes of Intervention:  Discussion and Education  Additional Comments:    [name] L 04/21/2024, 8:58 PM

## 2024-04-21 NOTE — Plan of Care (Signed)
   Problem: Education: Goal: Knowledge of [name] General Education information/materials will improve Outcome: Progressing Goal: Emotional status will improve Outcome: Progressing Goal: Mental status will improve Outcome: Progressing Goal: Verbalization of understanding the information provided will improve Outcome: Progressing   Problem: Activity: Goal: Interest or engagement in activities will improve Outcome: Progressing Goal: Sleeping patterns will improve Outcome: Progressing   Problem: Coping: Goal: Ability to verbalize frustrations and anger appropriately will improve Outcome: Progressing Goal: Ability to demonstrate self-control will improve Outcome: Progressing

## 2024-04-21 NOTE — Group Note (Signed)
 Recreation Therapy Group Note   Group Topic:Leisure Education  Group Date: 04/21/2024 Start Time: 1530 End Time: 1620 Facilitators: Celestia Jeoffrey FORBES ARTICE, CTRS Location: Craft Room  Group Description: Leisure. Patients were given the option to choose from journaling, coloring, drawing, making origami, playing with playdoh, listening to music or singing karaoke. LRT and pts discussed the meaning of leisure, the importance of participating in leisure during their free time/when they're outside of the hospital, as well as how our leisure interests can also serve as coping skills.   Goal Area(s) Addressed:  Patient will identify a current leisure interest.  Patient will learn the definition of leisure. Patient will practice making a positive decision. Patient will have the opportunity to try a new leisure activity. Patient will communicate with peers and LRT.   Affect/Mood: Appropriate   Participation Level: Moderate    Clinical Observations/Individualized Feedback: Alrick came late to group after meeting with NP. Pt was active while present.   Plan: Continue to engage patient in RT group sessions 2-3x/week.   Jeoffrey FORBES Celestia, LRT, CTRS 04/21/2024 5:08 PM

## 2024-04-21 NOTE — Plan of Care (Signed)
  Problem: Education: Goal: Knowledge of [facility] Education information/materials will improve Outcome: Progressing Goal: Emotional status will improve Outcome: Progressing Goal: Mental status will improve Outcome: Progressing Goal: Verbalization of understanding the information provided will improve Outcome: Progressing   Problem: Activity: Goal: Interest or engagement in activities will improve Outcome: Progressing Goal: Sleeping patterns will improve Outcome: Progressing   Problem: Coping: Goal: Ability to verbalize frustrations and anger appropriately will improve Outcome: Progressing Goal: Ability to demonstrate self-control will improve Outcome: Progressing   Problem: Health Behavior/Discharge Planning: Goal: Identification of resources available to assist in meeting health care needs will improve Outcome: Progressing Goal: Compliance with treatment plan for underlying cause of condition will improve Outcome: Progressing   Problem: Physical Regulation: Goal: Ability to maintain clinical measurements within normal limits will improve Outcome: Progressing   Problem: Safety: Goal: Periods of time without injury will increase Outcome: Progressing   Problem: Education: Goal: Ability to make informed decisions regarding treatment will improve Outcome: Progressing   Problem: Coping: Goal: Coping ability will improve Outcome: Progressing   Problem: Health Behavior/Discharge Planning: Goal: Identification of resources available to assist in meeting health care needs will improve Outcome: Progressing   Problem: Medication: Goal: Compliance with prescribed medication regimen will improve Outcome: Progressing   Problem: Self-Concept: Goal: Ability to disclose and discuss suicidal ideas will improve Outcome: Progressing Goal: Will verbalize positive feelings about self Outcome: Progressing Note: Patient is initiating therapy. Patient will maintain  adherence and work on increased adherence    Problem: Education: Goal: Utilization of techniques to improve thought processes will improve Outcome: Progressing Goal: Knowledge of the prescribed therapeutic regimen will improve Outcome: Progressing   Problem: Activity: Goal: Interest or engagement in leisure activities will improve Outcome: Progressing Goal: Imbalance in normal sleep/wake cycle will improve Outcome: Progressing   Problem: Coping: Goal: Coping ability will improve Outcome: Progressing Goal: Will verbalize feelings Outcome: Progressing   Problem: Health Behavior/Discharge Planning: Goal: Ability to make decisions will improve Outcome: Progressing Goal: Compliance with therapeutic regimen will improve Outcome: Progressing   Problem: Role Relationship: Goal: Will demonstrate positive changes in social behaviors and relationships Outcome: Progressing   Problem: Safety: Goal: Ability to disclose and discuss suicidal ideas will improve Outcome: Progressing Goal: Ability to identify and utilize support systems that promote safety will improve Outcome: Progressing   Problem: Self-Concept: Goal: Will verbalize positive feelings about self Outcome: Progressing Goal: Level of anxiety will decrease Outcome: Progressing

## 2024-04-21 NOTE — [facility] Group Notes (Signed)
 Spirituality Group   Description: Participant directed exploration of values, beliefs and meaning   **Focus on Gratitude: Invite reflection on sources of gratitude (external/internal); goal to invite internal gratitude to foster 1) reconnection with life-giving activities 2) self-compassion.   Following a brief framework of chaplains role and ground rules of group behavior, participants are invited to share concerns or questions that engage spiritual life. Emphasis placed on common themes and shared experiences and ways to make meaning and clarify living into ones values.   Theory/Process/Goal: Utilize the theoretical framework of group therapy established by Celena Kite, Relational Cultural Theory and Rogerian approaches to facilitate relational empathy and use of the here and now to foster reflection, self-awareness, and sharing.   Observations: [name] was an active participant in the group discussion.  Sheffield Hawker L. Delores HERO.Div

## 2024-04-22 DIAGNOSIS — F322 Major depressive disorder, single episode, severe without psychotic features: Secondary | ICD-10-CM | POA: Diagnosis not present

## 2024-04-22 NOTE — Progress Notes (Signed)
" °   04/22/24 0400  Psych Admission Type (Psych Patients Only)  Admission Status Voluntary  Psychosocial Assessment  Patient Complaints None  Eye Contact Fair;Watchful  Facial Expression Anxious;Worried  Affect Appropriate to circumstance  Surveyor, Minerals Activity Slow  Appearance/Hygiene Unremarkable  Behavior Characteristics Cooperative;Appropriate to situation  Mood Pleasant  Aggressive Behavior  Effect No apparent injury  Thought Process  Coherency WDL  Content WDL  Delusions None reported or observed  Perception WDL  Hallucination None reported or observed  Judgment WDL  Confusion None  Danger to Self  Current suicidal ideation? Denies  Self-Injurious Behavior No self-injurious ideation or behavior indicators observed or expressed   Agreement Not to Harm Self Yes  Description of Agreement Verbal  Danger to Others  Danger to Others None reported or observed    "

## 2024-04-22 NOTE — Plan of Care (Signed)
" °  Problem: Education: Goal: Emotional status will improve 04/22/2024 1805 by Zachary Titus BIRCH, RN Outcome: Progressing 04/22/2024 1013 by Zachary Titus BIRCH, RN Outcome: Progressing Goal: Mental status will improve 04/22/2024 1805 by Zachary Titus BIRCH, RN Outcome: Progressing 04/22/2024 1013 by Zachary Titus BIRCH, RN Outcome: Progressing   "

## 2024-04-22 NOTE — Progress Notes (Signed)
 [facility] MD Progress Note  04/22/2024 4:17 PM [name]  MRN:  [id number]  From Initial presentation/Admission:   Patient is a 53 year-old male admitted to inpatient psychiatric unit endorsing suicidal thoughts. Patient reports feeling like he had a breakdown. Patient describes sudden, intrusive urge to drink alcohol  after 13 years of sobriety while alone at home. This urge significantly alarmed patient given past ideation of drinking himself to death. Fear of relapse combined with worsening depression prompted patient to seek psychiatric care.  Subjective:  Chart reviewed, case discussed in multidisciplinary meeting, patient seen during rounds.   12/20: Patient found in hallway engaging with peer prior to evaluation. He reports he is ready to go. He feels he is doing better and reports a 0/10 for both anxiety and depression. He reports that he was overwhelmed and anxious upon coming into the hospital. He feels he needed some time away and needs to get back to his life.   Patient reporting that no one here is helping him, despite clinical research associate asking what this means to him and him unable to have an answer. He continues to decline medications, but reports engaging in groups - every one. Discussed outpatient providers for mental health and patient agreeable to referral.   Patient denies SI, HI, and AVH. Patient signed 72 hour document.   12/19: Patient found in the hallway upon evaluation. Reports that he is doing okay. He identifies he has been here before and does not like the facility, because no one helps me. Endorses feeling as though staff feel they are better than me. He expresses needing pain management and was informed he would need to follow up with PCP regarding a referral. Patient denies being in outpatient care, reporting never being given a referral. Denies wanting to take medication - further discussion and patient reported he will consider lexapro/prozac . He will let  provider know tomorrow if he would like to start medication.   Denies SI, HI, and AVH. Remains discharge focused.    Past Psychiatric History: see h&P Family History:  Family History  Problem Relation Age of Onset   Hypertension Mother    Hypertension Father    Hypertension Other    Social History:  Social History   Substance and Sexual Activity  Alcohol  Use No     Social History   Substance and Sexual Activity  Drug Use Yes   Types: Marijuana    Social History   Socioeconomic History   Marital status: Single    Spouse name: Not on file   Number of children: Not on file   Years of education: Not on file   Highest education level: Not on file  Occupational History   Not on file  Tobacco Use   Smoking status: Every Day    Types: Cigarettes   Smokeless tobacco: Never  Vaping Use   Vaping status: Never Used  Substance and Sexual Activity   Alcohol  use: No   Drug use: Yes    Types: Marijuana   Sexual activity: Yes    Birth control/protection: Condom  Other Topics Concern   Not on file  Social History Narrative   Not on file   Social Drivers of Health   Tobacco Use: High Risk (04/19/2024)   Patient History    Smoking Tobacco Use: Every Day    Smokeless Tobacco Use: Never    Passive Exposure: Not on file  Financial Resource Strain: Not on file  Food Insecurity: Food Insecurity Present (04/19/2024)   Epic  Worried About Programme Researcher, Broadcasting/film/video in the Last Year: Often true    Barista in the Last Year: Often true  Transportation Needs: Unmet Transportation Needs (04/19/2024)   Epic    Lack of Transportation (Medical): Yes    Lack of Transportation (Non-Medical): Yes  Physical Activity: Not on file  Stress: Not on file  Social Connections: Patient Declined (06/30/2023)   Social Connection and Isolation Panel    Frequency of Communication with Friends and Family: Patient declined    Frequency of Social Gatherings with Friends and Family: Patient  declined    Attends Religious Services: Patient declined    Database Administrator or Organizations: Patient declined    Attends Banker Meetings: Patient declined    Marital Status: Patient declined  Depression (PHQ2-9): High Risk (04/13/2023)   Depression (PHQ2-9)    PHQ-2 Score: 11  Alcohol  Screen: Low Risk (04/19/2024)   Alcohol  Screen    Last Alcohol  Screening Score (AUDIT): 2  Housing: Unknown (04/19/2024)   Epic    Unable to Pay for Housing in the Last Year: Patient declined    Number of Times Moved in the Last Year: 0    Homeless in the Last Year: No  Recent Concern: Housing - High Risk (02/23/2024)   Epic    Unable to Pay for Housing in the Last Year: Yes    Number of Times Moved in the Last Year: 0    Homeless in the Last Year: No  Utilities: At Risk (04/19/2024)   Epic    Threatened with loss of utilities: Yes  Health Literacy: Not on file   Past Medical History:  Past Medical History:  Diagnosis Date   Alcohol  abuse    Anemia    Colitis    Depression    Severe major depression with psychotic features (HCC)    History reviewed. No pertinent surgical history.  Current Medications: Current Facility-Administered Medications  Medication Dose Route Frequency Provider Last Rate Last Admin   alum & mag hydroxide-simeth (MAALOX/MYLANTA) 200-200-20 MG/5ML suspension 30 mL  30 mL Oral Q4H PRN White, Patrice L, NP       haloperidol  (HALDOL ) tablet 5 mg  5 mg Oral TID PRN White, Patrice L, NP       And   diphenhydrAMINE  (BENADRYL ) capsule 50 mg  50 mg Oral TID PRN White, Patrice L, NP       haloperidol  lactate (HALDOL ) injection 5 mg  5 mg Intramuscular TID PRN White, Patrice L, NP       And   diphenhydrAMINE  (BENADRYL ) injection 50 mg  50 mg Intramuscular TID PRN White, Patrice L, NP       And   LORazepam  (ATIVAN ) injection 2 mg  2 mg Intramuscular TID PRN White, Patrice L, NP       haloperidol  lactate (HALDOL ) injection 10 mg  10 mg Intramuscular TID PRN  White, Patrice L, NP       And   diphenhydrAMINE  (BENADRYL ) injection 50 mg  50 mg Intramuscular TID PRN White, Patrice L, NP       And   LORazepam  (ATIVAN ) injection 2 mg  2 mg Intramuscular TID PRN White, Patrice L, NP       feeding supplement (ENSURE PLUS HIGH PROTEIN) liquid 237 mL  237 mL Oral TID BM Jadapalle, Sree, MD   237 mL at 04/22/24 0820   hydrOXYzine  (ATARAX ) tablet 25 mg  25 mg Oral TID PRN White, Patrice L,  NP       magnesium  hydroxide (MILK OF MAGNESIA) suspension 30 mL  30 mL Oral Daily PRN White, Patrice L, NP       multivitamin with minerals tablet 1 tablet  1 tablet Oral Daily White, Patrice L, NP   1 tablet at 04/22/24 9180   thiamine  (VITAMIN B1) tablet 100 mg  100 mg Oral Daily White, Patrice L, NP   100 mg at 04/22/24 9180   traZODone  (DESYREL ) tablet 50 mg  50 mg Oral QHS PRN White, Patrice L, NP        Lab Results: No results found for this or any previous visit (from the past 48 hours).  Blood Alcohol  level:  Lab Results  Component Value Date   [facility] <15 04/18/2024   ETH <15 02/22/2024    Metabolic Disorder Labs: Lab Results  Component Value Date   HGBA1C 4.6 (L) 02/27/2024   MPG 85.32 02/27/2024   MPG 103 08/27/2014   No results found for: PROLACTIN Lab Results  Component Value Date   CHOL 201 (H) 02/27/2024   TRIG 76 02/27/2024   HDL 75 02/27/2024   CHOLHDL 2.7 02/27/2024   VLDL 15 02/27/2024   LDLCALC 111 (H) 02/27/2024    Physical Findings: AIMS:  , ,  ,  ,    CIWA:  CIWA-Ar Total: 0 COWS:      Psychiatric Specialty Exam:  Presentation  General Appearance:  Appropriate for Environment; Casual  Eye Contact: Good  Speech: Clear and Coherent; Normal Rate  Speech Volume: Normal    Mood and Affect  Mood: Euthymic  Affect: Appropriate   Thought Process  Thought Processes: Coherent; Goal Directed; Linear  Orientation:Full (Time, Place and Person)  Thought Content:Logical  Hallucinations:Hallucinations:  None  Ideas of Reference:None  Suicidal Thoughts:Suicidal Thoughts: No  Homicidal Thoughts:Homicidal Thoughts: No   Sensorium  Memory: Immediate Fair; Recent Fair; Remote Fair  Judgment: Fair  Insight: Fair   Art Therapist  Concentration: Fair  Attention Span: Fair  Recall: [employer] of Knowledge: Fair  Language: Fair   Psychomotor Activity  Psychomotor Activity: Psychomotor Activity: Normal  Musculoskeletal: Strength & Muscle Tone: within normal limits Gait & Station: normal Assets  Assets: Manufacturing Systems Engineer; Desire for Improvement    Physical Exam: Physical Exam Vitals and nursing note reviewed.  Constitutional:      Appearance: Normal appearance.  Cardiovascular:     Rate and Rhythm: Normal rate.  Pulmonary:     Effort: Pulmonary effort is normal.  Neurological:     Mental Status: He is alert and oriented to person, place, and time.  Psychiatric:        Mood and Affect: Mood normal.        Behavior: Behavior normal.    Review of Systems  Respiratory:  Negative for shortness of breath.   Cardiovascular:  Negative for chest pain.  Gastrointestinal:  Negative for constipation, nausea and vomiting.  Psychiatric/Behavioral:  Positive for substance abuse. Negative for depression, hallucinations and suicidal ideas. The patient is not nervous/anxious.   All other systems reviewed and are negative.  Blood pressure (!) 125/96, pulse 62, temperature 99.2 F (37.3 C), temperature source Oral, resp. rate 18, height 5' 7 (1.702 m), weight 55.8 kg, SpO2 99%. Body mass index is 19.26 kg/m.  Diagnosis: Principal Problem:   MDD (major depressive disorder), severe (HCC)   PLAN: Safety and Monitoring:  -- Voluntary admission to inpatient psychiatric unit for safety, stabilization and treatment  -- Daily contact  with patient to assess and evaluate symptoms and progress in treatment  -- Patient's case to be discussed in multi-disciplinary  team meeting  -- Observation Level : q15 minute checks  -- Vital signs:  q12 hours  -- Precautions: suicide, elopement, and assault -- Encouraged patient to participate in unit milieu and in scheduled group therapies  2. Psychiatric Treatment:  Scheduled Medications: Patient declined to start psychiatric medications 72 hour document signed 04/22/24   -- The risks/benefits/side-effects/alternatives to this medication were discussed in detail with the patient and time was given for questions. The patient consents to medication trial.  3. Medical Issues Being Addressed:  No issues or concerns expressed   4. Discharge Planning:   -- Social work and case management to assist with discharge planning and identification of hospital follow-up needs prior to discharge  -- Estimated LOS: 3-4 days  Will need PCP appointment (discussed this is how he discusses further management with pain management).   Laloni Rowton, NP 04/22/2024, 4:17 PM

## 2024-04-22 NOTE — Progress Notes (Signed)
" °   04/22/24 0820  Psych Admission Type (Psych Patients Only)  Admission Status Voluntary  Psychosocial Assessment  Patient Complaints None  Eye Contact Fair  Facial Expression Other (Comment) (WNL)  Affect Appropriate to circumstance  Speech Logical/coherent  Interaction Assertive  Motor Activity Slow  Appearance/Hygiene Unremarkable  Behavior Characteristics Cooperative;Appropriate to situation  Mood Pleasant  Thought Process  Coherency WDL  Content WDL  Delusions None reported or observed  Perception WDL  Hallucination None reported or observed  Judgment WDL  Confusion None  Danger to Self  Current suicidal ideation? Denies  Self-Injurious Behavior No self-injurious ideation or behavior indicators observed or expressed   Agreement Not to Harm Self Yes  Description of Agreement verbal  Danger to Others  Danger to Others None reported or observed    "

## 2024-04-22 NOTE — Progress Notes (Signed)
" °   04/22/24 2100  Psych Admission Type (Psych Patients Only)  Admission Status Voluntary  Date 72 hour document signed  04/22/24  Psychosocial Assessment  Patient Complaints None  Eye Contact Fair  Facial Expression Other (Comment)  Affect Appropriate to circumstance  Speech Logical/coherent  Interaction Assertive  Motor Activity Slow  Appearance/Hygiene Unremarkable  Behavior Characteristics Cooperative;Appropriate to situation  Mood Pleasant  Thought Process  Coherency WDL  Content WDL  Delusions None reported or observed  Perception WDL  Hallucination None reported or observed  Judgment WDL  Confusion None  Danger to Self  Current suicidal ideation? Denies  Self-Injurious Behavior No self-injurious ideation or behavior indicators observed or expressed   Agreement Not to Harm Self Yes  Danger to Others  Danger to Others None reported or observed    "

## 2024-04-22 NOTE — Plan of Care (Signed)
" °  Problem: Education: Goal: Knowledge of [facility] Education information/materials will improve Outcome: Progressing Goal: Emotional status will improve Outcome: Progressing Goal: Mental status will improve Outcome: Progressing Goal: Verbalization of understanding the information provided will improve Outcome: Progressing   Problem: Activity: Goal: Interest or engagement in activities will improve Outcome: Progressing   Problem: Medication: Goal: Compliance with prescribed medication regimen will improve Outcome: Progressing   Problem: Self-Concept: Goal: Will verbalize positive feelings about self Outcome: Progressing Note: Patient is on track. Patient will maintain adherence    Problem: Self-Concept: Goal: Level of anxiety will decrease Outcome: Progressing   "

## 2024-04-22 NOTE — Progress Notes (Signed)
 72 Hour Document Signed Note  Patient Details Name: [name] MRN: [id number] DOB: 1970/10/24 Today's Date: 04/22/2024   72 Hour Signed Documentation:  Admission Status: Voluntary/72 hour document signed Date 72 hour document signed : 04/22/24 Time 72 hour document signed : [id number] Provider Notified (First and Last Name) (see details for LINK to note): Glenys Beal, NP    [name] 04/22/2024, 1:37 PM

## 2024-04-22 NOTE — Group Note (Deleted)
 Date:  04/22/2024 Time:  8:44 PM  Group Topic/Focus:  Self Esteem Action Plan:   The focus of this group is to help patients create a plan to continue to build self-esteem after discharge.     Participation Level:  {[facility] PARTICIPATION OZCZO:77735}  Participation Quality:  {[facility] PARTICIPATION QUALITY:22265}  Affect:  {[facility] AFFECT:22266}  Cognitive:  {[facility] COGNITIVE:22267}  Insight: {B[facility] Insight2:20797}  Engagement in Group:  {[facility] ENGAGEMENT IN HMNLE:77731}  Modes of Intervention:  {[facility] MODES OF INTERVENTION:22269}  Additional Comments:  ***  Luciano Emilio CROME 04/22/2024, 8:44 PM

## 2024-04-22 NOTE — Group Note (Signed)
"                                                 [facility] LCSW Group Therapy Note    Group Date: 04/22/2024 Start Time: 1520 End Time: 1600  Type of Therapy and Topic:  Group Therapy:  Overcoming Obstacles  Participation Level:  [facility] PARTICIPATION LEVEL: Did Not Attend  Mood: Not able to assess (pt did not attend group).  Description of Group:   In this group patients will be encouraged to explore what they see as obstacles to their own wellness and recovery. They will be guided to discuss their thoughts, feelings, and behaviors related to these obstacles. The group will process together ways to cope with barriers, with attention given to specific choices patients can make. Each patient will be challenged to identify changes they are motivated to make in order to overcome their obstacles. This group will be process-oriented, with patients participating in exploration of their own experiences as well as giving and receiving support and challenge from other group members.  Therapeutic Goals: 1. Patient will identify personal and current obstacles as they relate to admission. 2. Patient will identify barriers that currently interfere with their wellness or overcoming obstacles.  3. Patient will identify feelings, thought process and behaviors related to these barriers. 4. Patient will identify two changes they are willing to make to overcome these obstacles:    Summary of Patient Progress  Patient did not participation in group.    Therapeutic Modalities:   Cognitive Behavioral Therapy Solution Focused Therapy Motivational Interviewing Relapse Prevention Therapy   Rexene LELON Mae, LCSWA "

## 2024-04-22 NOTE — Plan of Care (Signed)
   Problem: Education: Goal: Knowledge of [facility] Education information/materials will improve Outcome: Progressing Goal: Emotional status will improve Outcome: Progressing Goal: Mental status will improve Outcome: Progressing Goal: Verbalization of understanding the information provided will improve Outcome: Progressing   Problem: Activity: Goal: Interest or engagement in activities will improve Outcome: Progressing Goal: Sleeping patterns will improve Outcome: Progressing   Problem: Coping: Goal: Ability to verbalize frustrations and anger appropriately will improve Outcome: Progressing Goal: Ability to demonstrate self-control will improve Outcome: Progressing   Problem: Health Behavior/Discharge Planning: Goal: Identification of resources available to assist in meeting health care needs will improve Outcome: Progressing Goal: Compliance with treatment plan for underlying cause of condition will improve Outcome: Progressing   Problem: Physical Regulation: Goal: Ability to maintain clinical measurements within normal limits will improve Outcome: Progressing   Problem: Safety: Goal: Periods of time without injury will increase Outcome: Progressing   Problem: Education: Goal: Ability to make informed decisions regarding treatment will improve Outcome: Progressing   Problem: Coping: Goal: Coping ability will improve Outcome: Progressing   Problem: Health Behavior/Discharge Planning: Goal: Identification of resources available to assist in meeting health care needs will improve Outcome: Progressing   Problem: Medication: Goal: Compliance with prescribed medication regimen will improve Outcome: Progressing   Problem: Self-Concept: Goal: Ability to disclose and discuss suicidal ideas will improve Outcome: Progressing Goal: Will verbalize positive feelings about self Outcome: Progressing Note:     Problem: Education: Goal: Utilization of techniques  to improve thought processes will improve Outcome: Progressing Goal: Knowledge of the prescribed therapeutic regimen will improve Outcome: Progressing   Problem: Activity: Goal: Interest or engagement in leisure activities will improve Outcome: Progressing Goal: Imbalance in normal sleep/wake cycle will improve Outcome: Progressing   Problem: Coping: Goal: Coping ability will improve Outcome: Progressing Goal: Will verbalize feelings Outcome: Progressing   Problem: Health Behavior/Discharge Planning: Goal: Ability to make decisions will improve Outcome: Progressing Goal: Compliance with therapeutic regimen will improve Outcome: Progressing   Problem: Role Relationship: Goal: Will demonstrate positive changes in social behaviors and relationships Outcome: Progressing   Problem: Safety: Goal: Ability to disclose and discuss suicidal ideas will improve Outcome: Progressing Goal: Ability to identify and utilize support systems that promote safety will improve Outcome: Progressing   Problem: Self-Concept: Goal: Will verbalize positive feelings about self Outcome: Progressing Goal: Level of anxiety will decrease Outcome: Progressing

## 2024-04-22 NOTE — [facility] Counselor (Signed)
 CSW spoke to pt per provider's request to get information for collateral. Pt reports that CSW can call his sister, Watson Delaney) [place] [phone]) for collateral.   Watson reports that she is aware that pt has been feeling depressed and that she has tried to help pt in the past but reports he rejects the help.   She reports she currently lives in [place]  but that she calls pt daily to check on him.   She reports to her knowledge pt does NOT have guns in the home but DOES have household knives  Pt's sister states, he needs help, he needs medication for depression, but he doesn't give me the information, I even offered to let him come live with me.   CSW informed pt's sister that pt signed 72-hour  and wants to discharge from the hospital. Pt's sister reports pt does this, reporting that pt goes to a mental health facility and leaves without getting the help.   Pt's sister reports no concerns at this time.   CSW informed provider.   Lum Croft, MSW, [place] 04/22/2024 2:18 PM

## 2024-04-22 NOTE — Group Note (Signed)
 Date:  04/22/2024 Time:  10:08 AM  Group Topic/Focus:  Managing Feelings:   The focus of this group is to identify what feelings patients have difficulty handling and develop a plan to handle them in a healthier way upon discharge.    Participation Level:  Active  Participation Quality:  Appropriate  Affect:  Appropriate  Cognitive:  Appropriate  Insight: Appropriate  Engagement in Group:  Engaged  Modes of Intervention:  Activity  Additional Comments:    [name] 04/22/2024, 10:08 AM

## 2024-04-22 NOTE — Group Note (Signed)
 Date:  04/22/2024 Time:  8:49 PM  Group Topic/Focus:  Self Esteem Action Plan:   The focus of this group is to help patients create a plan to continue to build self-esteem after discharge.    Participation Level:  Active  Participation Quality:  Appropriate  Affect:  Appropriate  Cognitive:  Appropriate  Insight: Appropriate  Engagement in Group:  Engaged  Modes of Intervention:  Discussion  Additional Comments:    Robie Mcniel L 04/22/2024, 8:49 PM

## 2024-04-23 DIAGNOSIS — F322 Major depressive disorder, single episode, severe without psychotic features: Secondary | ICD-10-CM | POA: Diagnosis not present

## 2024-04-23 NOTE — Group Note (Signed)
 Date:  04/23/2024 Time:  10:41 AM  Group Topic/Focus:  Building Self Esteem:   The Focus of this group is helping patients become aware of the effects of self-esteem on their lives, the things they and others do that enhance or undermine their self-esteem, seeing the relationship between their level of self-esteem and the choices they make and learning ways to enhance self-esteem.    Participation Level:  Active  Participation Quality:  Appropriate  Affect:  Appropriate  Cognitive:  Appropriate  Insight: Appropriate  Engagement in Group:  Engaged and Supportive  Modes of Intervention:  Activity, Education, and Support  Additional Comments:    [name] June 04/23/2024, 10:41 AM

## 2024-04-23 NOTE — Progress Notes (Signed)
" °   04/23/24 2100  Psych Admission Type (Psych Patients Only)  Admission Status Voluntary/72 hour document signed  Date 72 hour document signed  04/22/24  Time 72 hour document signed  1332  Psychosocial Assessment  Patient Complaints None  Eye Contact Fair  Facial Expression Animated  Affect Appropriate to circumstance  Speech Logical/coherent  Interaction Assertive  Motor Activity Other (Comment) (appropriate for developmental age)  Appearance/Hygiene Unremarkable  Behavior Characteristics Cooperative  Mood Pleasant  Thought Process  Coherency WDL  Content WDL  Delusions None reported or observed  Perception WDL  Hallucination None reported or observed  Judgment Poor  Confusion None  Danger to Self  Current suicidal ideation? Denies  Self-Injurious Behavior No self-injurious ideation or behavior indicators observed or expressed   Danger to Others  Danger to Others None reported or observed    "

## 2024-04-23 NOTE — Progress Notes (Signed)
 [facility] MD Progress Note  04/23/2024 3:42 PM [name]  MRN:  [id number]  From Initial presentation/Admission:   Patient is a 53 year-old male admitted to inpatient psychiatric unit endorsing suicidal thoughts. Patient reports feeling like he had a breakdown. Patient describes sudden, intrusive urge to drink alcohol  after 13 years of sobriety while alone at home. This urge significantly alarmed patient given past ideation of drinking himself to death. Fear of relapse combined with worsening depression prompted patient to seek psychiatric care.  Subjective:  Chart reviewed, case discussed in multidisciplinary meeting, patient seen during rounds.   12/21: Patient present in milieu and engaging with others throughout day. Patient denies SI, HI, and AVH. He continues to be discharged focused. He does not want to restart medication at this time. Endorses wanting referrals for outpatient treatment upon discharge.   12/20: Patient found in hallway engaging with peer prior to evaluation. He reports he is ready to go. He feels he is doing better and reports a 0/10 for both anxiety and depression. He reports that he was overwhelmed and anxious upon coming into the hospital. He feels he needed some time away and needs to get back to his life.   Patient reporting that no one here is helping him, despite clinical research associate asking what this means to him and him unable to have an answer. He continues to decline medications, but reports engaging in groups - every one. Discussed outpatient providers for mental health and patient agreeable to referral.   Patient denies SI, HI, and AVH. Patient signed 72 hour document.   12/19: Patient found in the hallway upon evaluation. Reports that he is doing okay. He identifies he has been here before and does not like the facility, because no one helps me. Endorses feeling as though staff feel they are better than me. He expresses needing pain management and was  informed he would need to follow up with PCP regarding a referral. Patient denies being in outpatient care, reporting never being given a referral. Denies wanting to take medication - further discussion and patient reported he will consider lexapro/prozac . He will let provider know tomorrow if he would like to start medication.   Denies SI, HI, and AVH. Remains discharge focused.    Past Psychiatric History: see h&P Family History:  Family History  Problem Relation Age of Onset   Hypertension Mother    Hypertension Father    Hypertension Other    Social History:  Social History   Substance and Sexual Activity  Alcohol  Use No     Social History   Substance and Sexual Activity  Drug Use Yes   Types: Marijuana    Social History   Socioeconomic History   Marital status: Single    Spouse name: Not on file   Number of children: Not on file   Years of education: Not on file   Highest education level: Not on file  Occupational History   Not on file  Tobacco Use   Smoking status: Every Day    Types: Cigarettes   Smokeless tobacco: Never  Vaping Use   Vaping status: Never Used  Substance and Sexual Activity   Alcohol  use: No   Drug use: Yes    Types: Marijuana   Sexual activity: Yes    Birth control/protection: Condom  Other Topics Concern   Not on file  Social History Narrative   Not on file   Social Drivers of Health   Tobacco Use: High Risk (04/19/2024)  Patient History    Smoking Tobacco Use: Every Day    Smokeless Tobacco Use: Never    Passive Exposure: Not on file  Financial Resource Strain: Not on file  Food Insecurity: Food Insecurity Present (04/19/2024)   Epic    Worried About Programme Researcher, Broadcasting/film/video in the Last Year: Often true    Barista in the Last Year: Often true  Transportation Needs: Unmet Transportation Needs (04/19/2024)   Epic    Lack of Transportation (Medical): Yes    Lack of Transportation (Non-Medical): Yes  Physical Activity: Not  on file  Stress: Not on file  Social Connections: Patient Declined (06/30/2023)   Social Connection and Isolation Panel    Frequency of Communication with Friends and Family: Patient declined    Frequency of Social Gatherings with Friends and Family: Patient declined    Attends Religious Services: Patient declined    Active Member of Clubs or Organizations: Patient declined    Attends Banker Meetings: Patient declined    Marital Status: Patient declined  Depression (PHQ2-9): High Risk (04/13/2023)   Depression (PHQ2-9)    PHQ-2 Score: 11  Alcohol  Screen: Low Risk (04/19/2024)   Alcohol  Screen    Last Alcohol  Screening Score (AUDIT): 2  Housing: Unknown (04/19/2024)   Epic    Unable to Pay for Housing in the Last Year: Patient declined    Number of Times Moved in the Last Year: 0    Homeless in the Last Year: No  Recent Concern: Housing - High Risk (02/23/2024)   Epic    Unable to Pay for Housing in the Last Year: Yes    Number of Times Moved in the Last Year: 0    Homeless in the Last Year: No  Utilities: At Risk (04/19/2024)   Epic    Threatened with loss of utilities: Yes  Health Literacy: Not on file   Past Medical History:  Past Medical History:  Diagnosis Date   Alcohol  abuse    Anemia    Colitis    Depression    Severe major depression with psychotic features (HCC)    History reviewed. No pertinent surgical history.  Current Medications: Current Facility-Administered Medications  Medication Dose Route Frequency Provider Last Rate Last Admin   alum & mag hydroxide-simeth (MAALOX/MYLANTA) 200-200-20 MG/5ML suspension 30 mL  30 mL Oral Q4H PRN White, Patrice L, NP       haloperidol  (HALDOL ) tablet 5 mg  5 mg Oral TID PRN White, Patrice L, NP       And   diphenhydrAMINE  (BENADRYL ) capsule 50 mg  50 mg Oral TID PRN White, Patrice L, NP       haloperidol  lactate (HALDOL ) injection 5 mg  5 mg Intramuscular TID PRN White, Patrice L, NP       And    diphenhydrAMINE  (BENADRYL ) injection 50 mg  50 mg Intramuscular TID PRN White, Patrice L, NP       And   LORazepam  (ATIVAN ) injection 2 mg  2 mg Intramuscular TID PRN White, Patrice L, NP       haloperidol  lactate (HALDOL ) injection 10 mg  10 mg Intramuscular TID PRN White, Patrice L, NP       And   diphenhydrAMINE  (BENADRYL ) injection 50 mg  50 mg Intramuscular TID PRN White, Patrice L, NP       And   LORazepam  (ATIVAN ) injection 2 mg  2 mg Intramuscular TID PRN Teresa Wyline CROME, NP  feeding supplement (ENSURE PLUS HIGH PROTEIN) liquid 237 mL  237 mL Oral TID BM Jadapalle, Sree, MD   237 mL at 04/23/24 1335   hydrOXYzine  (ATARAX ) tablet 25 mg  25 mg Oral TID PRN White, Patrice L, NP       magnesium  hydroxide (MILK OF MAGNESIA) suspension 30 mL  30 mL Oral Daily PRN White, Patrice L, NP       multivitamin with minerals tablet 1 tablet  1 tablet Oral Daily White, Patrice L, NP   1 tablet at 04/23/24 9191   thiamine  (VITAMIN B1) tablet 100 mg  100 mg Oral Daily White, Patrice L, NP   100 mg at 04/23/24 9191   traZODone  (DESYREL ) tablet 50 mg  50 mg Oral QHS PRN White, Patrice L, NP        Lab Results: No results found for this or any previous visit (from the past 48 hours).  Blood Alcohol  level:  Lab Results  Component Value Date   [facility] <15 04/18/2024   ETH <15 02/22/2024    Metabolic Disorder Labs: Lab Results  Component Value Date   HGBA1C 4.6 (L) 02/27/2024   MPG 85.32 02/27/2024   MPG 103 08/27/2014   No results found for: PROLACTIN Lab Results  Component Value Date   CHOL 201 (H) 02/27/2024   TRIG 76 02/27/2024   HDL 75 02/27/2024   CHOLHDL 2.7 02/27/2024   VLDL 15 02/27/2024   LDLCALC 111 (H) 02/27/2024    Physical Findings: AIMS:  , ,  ,  ,    CIWA:  CIWA-Ar Total: 0 COWS:      Psychiatric Specialty Exam:  Presentation  General Appearance:  Appropriate for Environment; Casual  Eye Contact: Good  Speech: Clear and Coherent; Normal Rate  Speech  Volume: Normal    Mood and Affect  Mood: Euthymic  Affect: Appropriate   Thought Process  Thought Processes: Coherent; Goal Directed; Linear  Orientation:Full (Time, Place and Person)  Thought Content:Logical  Hallucinations:Hallucinations: None  Ideas of Reference:None  Suicidal Thoughts:Suicidal Thoughts: No  Homicidal Thoughts:Homicidal Thoughts: No   Sensorium  Memory: Immediate Fair; Recent Fair; Remote Fair  Judgment: Fair  Insight: Fair   Art Therapist  Concentration: Fair  Attention Span: Fair  Recall: [employer] of Knowledge: Fair  Language: Fair   Psychomotor Activity  Psychomotor Activity: Psychomotor Activity: Normal  Musculoskeletal: Strength & Muscle Tone: within normal limits Gait & Station: normal Assets  Assets: Manufacturing Systems Engineer; Desire for Improvement    Physical Exam: Physical Exam Vitals and nursing note reviewed.  Constitutional:      Appearance: Normal appearance.  Cardiovascular:     Rate and Rhythm: Normal rate.  Pulmonary:     Effort: Pulmonary effort is normal.  Neurological:     Mental Status: He is alert and oriented to person, place, and time.  Psychiatric:        Mood and Affect: Mood normal.        Behavior: Behavior normal.    Review of Systems  Respiratory:  Negative for shortness of breath.   Cardiovascular:  Negative for chest pain.  Gastrointestinal:  Negative for constipation, nausea and vomiting.  Psychiatric/Behavioral:  Positive for substance abuse. Negative for depression, hallucinations and suicidal ideas. The patient is not nervous/anxious.   All other systems reviewed and are negative.  Blood pressure 126/79, pulse 69, temperature 98.6 F (37 C), temperature source Oral, resp. rate 16, height 5' 7 (1.702 m), weight 55.8 kg, SpO2 100%. Body  mass index is 19.26 kg/m.  Diagnosis: Principal Problem:   MDD (major depressive disorder), severe (HCC)   PLAN: Safety and  Monitoring:  -- Voluntary admission to inpatient psychiatric unit for safety, stabilization and treatment  -- Daily contact with patient to assess and evaluate symptoms and progress in treatment  -- Patient's case to be discussed in multi-disciplinary team meeting  -- Observation Level : q15 minute checks  -- Vital signs:  q12 hours  -- Precautions: suicide, elopement, and assault -- Encouraged patient to participate in unit milieu and in scheduled group therapies  2. Psychiatric Treatment:  Scheduled Medications: Patient declined to restart psychiatric medications 72 hour document signed 04/22/24   -- The risks/benefits/side-effects/alternatives to this medication were discussed in detail with the patient and time was given for questions. The patient consents to medication trial.   3. Medical Issues Being Addressed:  No issues or concerns expressed   4. Discharge Planning:   -- Social work and case management to assist with discharge planning and identification of hospital follow-up needs prior to discharge  -- Estimated LOS: 3-4 days Likely discharge Monday  Sephira Zellman, NP 04/23/2024, 3:42 PM

## 2024-04-23 NOTE — Plan of Care (Signed)

## 2024-04-23 NOTE — Plan of Care (Signed)

## 2024-04-24 DIAGNOSIS — F322 Major depressive disorder, single episode, severe without psychotic features: Secondary | ICD-10-CM | POA: Diagnosis not present

## 2024-04-24 MED ORDER — VITAMIN B-1 100 MG PO TABS: 100.0000 mg | ORAL_TABLET | Freq: Every day | ORAL | 0 refills | Status: AC

## 2024-04-24 NOTE — Progress Notes (Signed)
" °   04/24/24 1000  Psych Admission Type (Psych Patients Only)  Admission Status Voluntary/72 hour document signed  Date 72 hour document signed  04/22/24  Time 72 hour document signed   (see cell from 04/22/2024)  Psychosocial Assessment  Patient Complaints Agitation;Sleep disturbance (patient stated that he has not talked to anyone about his situation and he hasn't been updated on his discharge date; patient also stated that his room is cold, which is why he couldn't sleep.)  Eye Contact Watchful;Intense  Facial Expression Anxious  Affect Preoccupied  Speech Logical/coherent  Interaction Assertive  Motor Activity Slow  Appearance/Hygiene Layered clothes;Unremarkable  Behavior Characteristics Cooperative;Appropriate to situation  Mood Preoccupied;Pleasant (patient states that I'm just ready to go. My life doesn't stop just because I'm in here. U have bills to pay.)  Aggressive Behavior  Effect No apparent injury  Thought Process  Coherency WDL  Content WDL  Delusions None reported or observed  Perception WDL  Hallucination None reported or observed  Judgment WDL  Confusion None  Danger to Self  Current suicidal ideation? Denies  Self-Injurious Behavior No self-injurious ideation or behavior indicators observed or expressed   Agreement Not to Harm Self Yes  Description of Agreement Verbal  Danger to Others  Danger to Others None reported or observed   Patient's goal for today, per his self-inventory is getting out of here, in which he will attend all groups in order to achieve his goal. "

## 2024-04-24 NOTE — Group Note (Signed)
 Date:  04/24/2024 Time:  12:10 PM  Group Topic/Focus:  Goals Group:   The focus of this group is to help patients establish daily goals to achieve during treatment and discuss how the patient can incorporate goal setting into their daily lives to aide in recovery. We also went over positive thinking and redirecting negative thoughts to positive ones, while making a list of positive what if's to help positive redirection.   Participation Level:  Did Not Attend  [name] 04/24/2024, 12:10 PM

## 2024-04-24 NOTE — Plan of Care (Signed)
  Problem: Education: Goal: Knowledge of [facility] Education information/materials will improve Outcome: Adequate for Discharge Goal: Emotional status will improve Outcome: Adequate for Discharge Goal: Mental status will improve Outcome: Adequate for Discharge Goal: Verbalization of understanding the information provided will improve Outcome: Adequate for Discharge   Problem: Activity: Goal: Interest or engagement in activities will improve Outcome: Adequate for Discharge Goal: Sleeping patterns will improve Outcome: Adequate for Discharge   Problem: Coping: Goal: Ability to verbalize frustrations and anger appropriately will improve Outcome: Adequate for Discharge Goal: Ability to demonstrate self-control will improve Outcome: Adequate for Discharge   Problem: Health Behavior/Discharge Planning: Goal: Identification of resources available to assist in meeting health care needs will improve Outcome: Adequate for Discharge Goal: Compliance with treatment plan for underlying cause of condition will improve Outcome: Adequate for Discharge   Problem: Physical Regulation: Goal: Ability to maintain clinical measurements within normal limits will improve Outcome: Adequate for Discharge   Problem: Safety: Goal: Periods of time without injury will increase Outcome: Adequate for Discharge   Problem: Education: Goal: Ability to make informed decisions regarding treatment will improve Outcome: Adequate for Discharge   Problem: Coping: Goal: Coping ability will improve Outcome: Adequate for Discharge   Problem: Health Behavior/Discharge Planning: Goal: Identification of resources available to assist in meeting health care needs will improve Outcome: Adequate for Discharge   Problem: Medication: Goal: Compliance with prescribed medication regimen will improve Outcome: Adequate for Discharge   Problem: Self-Concept: Goal: Ability to disclose and discuss suicidal ideas  will improve Outcome: Adequate for Discharge Goal: Will verbalize positive feelings about self Outcome: Adequate for Discharge   Problem: Education: Goal: Utilization of techniques to improve thought processes will improve Outcome: Adequate for Discharge Goal: Knowledge of the prescribed therapeutic regimen will improve Outcome: Adequate for Discharge   Problem: Activity: Goal: Interest or engagement in leisure activities will improve Outcome: Adequate for Discharge Goal: Imbalance in normal sleep/wake cycle will improve Outcome: Adequate for Discharge   Problem: Coping: Goal: Coping ability will improve Outcome: Adequate for Discharge Goal: Will verbalize feelings Outcome: Adequate for Discharge   Problem: Health Behavior/Discharge Planning: Goal: Ability to make decisions will improve Outcome: Adequate for Discharge Goal: Compliance with therapeutic regimen will improve Outcome: Adequate for Discharge   Problem: Role Relationship: Goal: Will demonstrate positive changes in social behaviors and relationships Outcome: Adequate for Discharge   Problem: Safety: Goal: Ability to disclose and discuss suicidal ideas will improve Outcome: Adequate for Discharge Goal: Ability to identify and utilize support systems that promote safety will improve Outcome: Adequate for Discharge   Problem: Self-Concept: Goal: Will verbalize positive feelings about self Outcome: Adequate for Discharge Goal: Level of anxiety will decrease Outcome: Adequate for Discharge   

## 2024-04-24 NOTE — Progress Notes (Signed)
 Patient ID: [name], male   DOB: 04/29/1971, 53 y.o.   MRN: [id number]  Discharge Note:  Patient denies SI/HI/AVH at this time. Discharge instructions, AVS, prescriptions, and transition record gone over with patient. Patient declined on completing his Suicide Safety Plan. Patient agrees to comply with medication management, follow-up visit, and outpatient therapy. Patient belongings returned to patient. Patient questions and concerns addressed and answered. Patient ambulatory off unit. Patient discharged to home via [facility] services.

## 2024-04-24 NOTE — BHH Suicide Risk Assessment (Signed)
 [facility] Discharge Suicide Risk Assessment   Principal Problem: MDD (major depressive disorder), severe (HCC) Discharge Diagnoses: Principal Problem:   MDD (major depressive disorder), severe (HCC)   Total Time spent with patient: 30 minutes  Musculoskeletal: Strength & Muscle Tone: within normal limits Gait & Station: normal Patient leans: N/A  Psychiatric Specialty Exam  Presentation  General Appearance:  Appropriate for Environment  Eye Contact: Fair  Speech: Clear and Coherent  Speech Volume: Normal  Handedness: Right   Mood and Affect  Mood: Euthymic  Duration of Depression Symptoms: Greater than two weeks  Affect: Appropriate   Thought Process  Thought Processes: Coherent  Descriptions of Associations:Intact  Orientation:Full (Time, Place and Person)  Thought Content:Logical  History of Schizophrenia/Schizoaffective disorder:No  Duration of Psychotic Symptoms:No data recorded Hallucinations:Hallucinations: None  Ideas of Reference:None  Suicidal Thoughts:Suicidal Thoughts: No  Homicidal Thoughts:Homicidal Thoughts: No   Sensorium  Memory: Immediate Fair; Recent Fair  Judgment: Fair  Insight: Fair   Art Therapist  Concentration: Fair  Attention Span: Fair  Recall: [employer] of Knowledge: Fair  Language: Fair   Psychomotor Activity  Psychomotor Activity: Psychomotor Activity: Normal   Assets  Assets: Communication Skills; Desire for Improvement   Sleep  Sleep: Sleep: Fair  Estimated Sleeping Duration (Last 24 Hours): 6.50-8.75 hours  Physical Exam: Physical Exam Vitals and nursing note reviewed.    ROS Blood pressure 121/76, pulse 87, temperature 98.6 F (37 C), temperature source Oral, resp. rate 12, height 5' 7 (1.702 m), weight 55.8 kg, SpO2 99%. Body mass index is 19.26 kg/m.  Mental Status Per Nursing Assessment::   On Admission:  Suicidal ideation indicated by patient  Demographic  Factors:  Male  Loss Factors: NA  Historical Factors: Impulsivity  Risk Reduction Factors:   Positive social support, Positive therapeutic relationship, and Positive coping skills or problem solving skills  Continued Clinical Symptoms:  Depression:   Comorbid alcohol  abuse/dependence  Cognitive Features That Contribute To Risk:  None    Suicide Risk:  Minimal: No identifiable suicidal ideation.  Patients presenting with no risk factors but with morbid ruminations; may be classified as minimal risk based on the severity of the depressive symptoms   Follow-up Information     [facility] Follow up.   Why: Your appointment is scheduled for 05/01/24 at 2 PM. Contact information: [address]  [address] [place] [address] [phone]                 Plan Of Care/Follow-up recommendations:  Activity:  as tolerated  Allyn Foil, MD 04/24/2024, 12:02 PM

## 2024-04-24 NOTE — Discharge Summary (Incomplete)
 " Physician Discharge Summary Note  Patient:  [name] is an 53 y.o., male MRN:  [id number] DOB:  1971-02-03 Patient phone:  [phone] (home)  Patient address:   [address] [place] [address],   Total time spent: 40 min Date of Admission:  04/19/2024 Date of Discharge: 04/24/2024  Reason for Admission:  Patient is a 53 year-old male admitted to inpatient psychiatric unit endorsing suicidal thoughts. Patient reports feeling like he had a breakdown. Patient describes sudden, intrusive urge to drink alcohol  after 13 years of sobriety while alone at home. This urge significantly alarmed patient given past ideation of drinking himself to death. Fear of relapse combined with worsening depression prompted patient to seek psychiatric care   Principal Problem: MDD (major depressive disorder), severe (HCC) Discharge Diagnoses: Principal Problem:   MDD (major depressive disorder), severe (HCC)   Past Psychiatric History: see h&p  Family Psychiatric  History: see h&p Social History:  Social History   Substance and Sexual Activity  Alcohol  Use No     Social History   Substance and Sexual Activity  Drug Use Yes   Types: Marijuana    Social History   Socioeconomic History   Marital status: Single    Spouse name: Not on file   Number of children: Not on file   Years of education: Not on file   Highest education level: Not on file  Occupational History   Not on file  Tobacco Use   Smoking status: Every Day    Types: Cigarettes   Smokeless tobacco: Never  Vaping Use   Vaping status: Never Used  Substance and Sexual Activity   Alcohol  use: No   Drug use: Yes    Types: Marijuana   Sexual activity: Yes    Birth control/protection: Condom  Other Topics Concern   Not on file  Social History Narrative   Not on file   Social Drivers of Health   Tobacco Use: High Risk (04/19/2024)   Patient History    Smoking Tobacco Use: Every Day    Smokeless  Tobacco Use: Never    Passive Exposure: Not on file  Financial Resource Strain: Not on file  Food Insecurity: Food Insecurity Present (04/19/2024)   Epic    Worried About Programme Researcher, Broadcasting/film/video in the Last Year: Often true    Barista in the Last Year: Often true  Transportation Needs: Unmet Transportation Needs (04/19/2024)   Epic    Lack of Transportation (Medical): Yes    Lack of Transportation (Non-Medical): Yes  Physical Activity: Not on file  Stress: Not on file  Social Connections: Patient Declined (06/30/2023)   Social Connection and Isolation Panel    Frequency of Communication with Friends and Family: Patient declined    Frequency of Social Gatherings with Friends and Family: Patient declined    Attends Religious Services: Patient declined    Active Member of Clubs or Organizations: Patient declined    Attends Banker Meetings: Patient declined    Marital Status: Patient declined  Depression (PHQ2-9): High Risk (04/13/2023)   Depression (PHQ2-9)    PHQ-2 Score: 11  Alcohol  Screen: Low Risk (04/19/2024)   Alcohol  Screen    Last Alcohol  Screening Score (AUDIT): 2  Housing: Unknown (04/19/2024)   Epic    Unable to Pay for Housing in the Last Year: Patient declined    Number of Times Moved in the Last Year: 0    Homeless in the Last Year:  No  Recent Concern: Housing - High Risk (02/23/2024)   Epic    Unable to Pay for Housing in the Last Year: Yes    Number of Times Moved in the Last Year: 0    Homeless in the Last Year: No  Utilities: At Risk (04/19/2024)   Epic    Threatened with loss of utilities: Yes  Health Literacy: Not on file   Past Medical History:  Past Medical History:  Diagnosis Date   Alcohol  abuse    Anemia    Colitis    Depression    Severe major depression with psychotic features (HCC)    History reviewed. No pertinent surgical history. Family History:  Family History  Problem Relation Age of Onset   Hypertension Mother     Hypertension Father    Hypertension Other     Hospital Course:  Patient is a 53 year-old male admitted to inpatient psychiatric unit endorsing suicidal thoughts. Patient reports feeling like he had a breakdown. Patient describes sudden, intrusive urge to drink alcohol  after 13 years of sobriety while alone at home. This urge significantly alarmed patient given past ideation of drinking himself to death. Fear of relapse combined with worsening depression prompted patient to seek psychiatric care   On admission, patient is noted to be doing well on the inpatient unit and maintain safe behaviors throughout the admission.  Patient was admitted voluntarily and he declined restarting any psychiatric medications.  He signed 72-hour AMA document on 04/22/2024.  Patient maintains safe behaviors on the unit with no reported behavioral problems.  Patient did not meet criteria for involuntary commitment at this time.  Patient has stable living condition and has wraparound services in the community.  Detailed risk assessment is complete based on clinical exam and individual risk factors and acute suicide risk is low and acute violence risk is low.    On the day of discharge, patient denies SI/HI/plan and denies hallucinations.  Patient remains future oriented and is willing to participate in outpatient mental health services.  Currently, all modifiable risk of harm to self/harm to others have been addressed and patient is no longer appropriate for the acute inpatient setting and is able to continue treatment for mental health needs in the community with the supports as indicated below.  Patient is educated and verbalized understanding of discharge plan of care including medications, follow-up appointments, mental health resources and further crisis services in the community.  He is instructed to call 911 or present to the nearest emergency room should he experience any decompensation in mood, disturbance of  bowel or return of suicidal/homicidal ideations.  Patient verbalizes understanding of this education and agrees to this plan of care  Physical Findings: AIMS:  , ,  ,  ,    CIWA:  CIWA-Ar Total: 0 COWS:      Psychiatric Specialty Exam:  Presentation  General Appearance:  Appropriate for Environment  Eye Contact: Fair  Speech: Clear and Coherent  Speech Volume: Normal    Mood and Affect  Mood: Euthymic  Affect: Appropriate   Thought Process  Thought Processes: Coherent  Descriptions of Associations:Intact  Orientation:Full (Time, Place and Person)  Thought Content:Logical  Hallucinations:Hallucinations: None  Ideas of Reference:None  Suicidal Thoughts:Suicidal Thoughts: No  Homicidal Thoughts:Homicidal Thoughts: No   Sensorium  Memory: Immediate Fair; Recent Fair  Judgment: Fair  Insight: Fair   Art Therapist  Concentration: Fair  Attention Span: Fair  Recall: [employer] of Knowledge: Fair  Language: Fair  Psychomotor Activity  Psychomotor Activity: Psychomotor Activity: Normal  Musculoskeletal: Strength & Muscle Tone: within normal limits Gait & Station: normal Assets  Assets: Manufacturing Systems Engineer; Desire for Improvement   Sleep  Sleep: Sleep: Fair    Physical Exam: Physical Exam ROS Blood pressure 121/76, pulse 87, temperature 98.6 F (37 C), temperature source Oral, resp. rate 12, height 5' 7 (1.702 m), weight 55.8 kg, SpO2 99%. Body mass index is 19.26 kg/m.   Tobacco Use History[1] Tobacco Cessation:  N/A, patient does not currently use tobacco products   Blood Alcohol  level:  Lab Results  Component Value Date   [facility] <15 04/18/2024   ETH <15 02/22/2024    Metabolic Disorder Labs:  Lab Results  Component Value Date   HGBA1C 4.6 (L) 02/27/2024   MPG 85.32 02/27/2024   MPG 103 08/27/2014   No results found for: PROLACTIN Lab Results  Component Value Date   CHOL 201 (H) 02/27/2024    TRIG 76 02/27/2024   HDL 75 02/27/2024   CHOLHDL 2.7 02/27/2024   VLDL 15 02/27/2024   LDLCALC 111 (H) 02/27/2024    See Psychiatric Specialty Exam and Suicide Risk Assessment completed by Attending Physician prior to discharge.  Discharge destination:  Home  Is patient on multiple antipsychotic therapies at discharge:  No   Has Patient had three or more failed trials of antipsychotic monotherapy by history:  No  Recommended Plan for Multiple Antipsychotic Therapies: NA   Allergies as of 04/24/2024       Reactions   Aspirin Hives, Nausea And Vomiting   Ibuprofen  Hives, Nausea And Vomiting   Porcine (pork) Protein-containing Drug Products Other (See Comments)   Stomach pain. Pt does not eat pork   Acetaminophen  Nausea Only   Sertraline  Diarrhea, Other (See Comments)   Acute GI distress with diarrhea and pain.         Medication List     STOP taking these medications    DULoxetine  30 MG capsule Commonly known as: CYMBALTA    feeding supplement Liqd   nicotine  14 mg/24hr patch Commonly known as: NICODERM CQ  - dosed in mg/24 hours   ondansetron  4 MG tablet Commonly known as: ZOFRAN        TAKE these medications      Indication  Certa[employer]ite/Antioxidants Tabs Take 1 tablet by mouth daily.  Indication: Weight Loss   thiamine  100 MG tablet Commonly known as: Vitamin B-1 Take 1 tablet (100 mg total) by mouth daily. Start taking on: April 25, 2024  Indication: Deficiency of Vitamin B1        Follow-up Information     [facility] Follow up.   Why: Your appointment is scheduled for 05/01/24 at 2 PM. Contact information: [address]  [address] [place] [address] [phone]                 Follow-up recommendations:  Activity:  As tolerated    Signed: Emiliana Blaize, MD 04/24/2024, 12:03 PM          [1]  Social History Tobacco Use  Smoking Status Every Day   Types: Cigarettes  Smokeless Tobacco Never   "

## 2024-05-07 ENCOUNTER — Other Ambulatory Visit: Payer: Self-pay

## 2024-05-07 ENCOUNTER — Emergency Department (HOSPITAL_COMMUNITY)
Admission: EM | Admit: 2024-05-07 | Discharge: 2024-05-07 | Disposition: A | Discharge status: Home / Self Care | Payer: MEDICAID | Attending: Emergency Medicine | Admitting: Emergency Medicine

## 2024-05-07 ENCOUNTER — Emergency Department (HOSPITAL_COMMUNITY)
Admission: EM | Admit: 2024-05-07 | Discharge: 2024-05-08 | Disposition: A | Discharge status: Home / Self Care | Payer: MEDICAID | Source: Home / Self Care | Attending: Emergency Medicine | Admitting: Emergency Medicine

## 2024-05-07 ENCOUNTER — Encounter (HOSPITAL_COMMUNITY): Payer: Self-pay | Admitting: *Deleted

## 2024-05-07 DIAGNOSIS — R0981 Nasal congestion: Secondary | ICD-10-CM | POA: Diagnosis present

## 2024-05-07 DIAGNOSIS — J069 Acute upper respiratory infection, unspecified: Secondary | ICD-10-CM

## 2024-05-07 DIAGNOSIS — J111 Influenza due to unidentified influenza virus with other respiratory manifestations: Secondary | ICD-10-CM

## 2024-05-07 DIAGNOSIS — R197 Diarrhea, unspecified: Secondary | ICD-10-CM | POA: Diagnosis not present

## 2024-05-07 DIAGNOSIS — J101 Influenza due to other identified influenza virus with other respiratory manifestations: Secondary | ICD-10-CM | POA: Diagnosis not present

## 2024-05-07 DIAGNOSIS — R509 Fever, unspecified: Secondary | ICD-10-CM | POA: Insufficient documentation

## 2024-05-07 DIAGNOSIS — J3489 Other specified disorders of nose and nasal sinuses: Secondary | ICD-10-CM | POA: Diagnosis not present

## 2024-05-07 DIAGNOSIS — R0982 Postnasal drip: Secondary | ICD-10-CM | POA: Diagnosis not present

## 2024-05-07 DIAGNOSIS — R067 Sneezing: Secondary | ICD-10-CM | POA: Diagnosis not present

## 2024-05-07 DIAGNOSIS — M791 Myalgia, unspecified site: Secondary | ICD-10-CM | POA: Insufficient documentation

## 2024-05-07 MED ORDER — ACETAMINOPHEN 325 MG PO TABS: 650.0000 mg | ORAL_TABLET | Freq: Once | ORAL | Status: DC

## 2024-05-07 MED ORDER — LORATADINE 10 MG PO TABS
10.0000 mg | ORAL_TABLET | Freq: Every day | ORAL | Status: DC
  Administered 2024-05-07: 10 mg via ORAL
  Filled 2024-05-07: qty 1

## 2024-05-07 MED ORDER — ONDANSETRON 4 MG PO TBDP
4.0000 mg | ORAL_TABLET | Freq: Once | ORAL | Status: AC
  Administered 2024-05-07: 4 mg via ORAL
  Filled 2024-05-07: qty 1

## 2024-05-07 MED ORDER — ONDANSETRON 8 MG PO TBDP: 8.0000 mg | ORAL_TABLET | Freq: Three times a day (TID) | ORAL | 0 refills | Status: AC | PRN

## 2024-05-07 NOTE — ED Notes (Signed)
 Patient given a water and ginger ale for PO challenge. Patient was able to keep fluids down.

## 2024-05-07 NOTE — ED Notes (Signed)
 Patient given 480ml of water to drink. Patient agreeable to trying food if able to tolerate po fluids.

## 2024-05-07 NOTE — ED Triage Notes (Signed)
 BIB [place] [facility] [place]. Pt complains of body aches and diarrhea x 3 days. A&O x 4  EMS VS 160 palpated 114 hr 20 RR 94% RA 97 temp

## 2024-05-07 NOTE — ED Notes (Addendum)
 Patient is A&Ox4 upon discharge. Patient verbalized understanding of prescriptions, discharge instructions and follow-up care. Patient refused water and meal to go. Patient ambulatory from ED with steady gait.

## 2024-05-07 NOTE — ED Notes (Signed)
 Pt ambulatory to RR. Reports minimal dizziness.

## 2024-05-07 NOTE — ED Provider Notes (Signed)
 " [place] EMERGENCY DEPARTMENT AT [facility] Provider Note   CSN: [id number] Arrival date & time: 05/07/24  1733     Patient presents with: Generalized Body Aches   [name] is a 54 y.o. male.   HPI     33 patient with pertinent history of depression, anemia, alcohol  use disorder history comes to the ER with complains of congestion, sneezing, post nasal drip, myalgias, diarrhea, fever, and chills for 3 days.   Patient denies any sick contacts.  He reports 3-4 episodes of loose bowel movements.  He has lost appetite and taste, therefore has not had anything to eat or drink.  Patient's cough is dry.   Prior to Admission medications  Medication Sig Start Date End Date Taking? Authorizing Provider  ondansetron  (ZOFRAN -ODT) 8 MG disintegrating tablet Take 1 tablet (8 mg total) by mouth every 8 (eight) hours as needed for nausea. 05/07/24  Yes Temple Ewart, MD  Multiple Vitamin (MULTIVITAMIN WITH MINERALS) TABS tablet Take 1 tablet by mouth daily. 02/29/24   Jadapalle, Sree, MD  thiamine  (VITAMIN B-1) 100 MG tablet Take 1 tablet (100 mg total) by mouth daily. 04/25/24   Jadapalle, Sree, MD    Allergies: Aspirin, Ibuprofen , Porcine (pork) protein-containing drug products, Acetaminophen , and Sertraline     Review of Systems  All other systems reviewed and are negative.   Updated Vital Signs BP (!) 171/85   Pulse 68   Temp 100.2 F (37.9 C)   Resp 20   Ht 5' 7 (1.702 m)   Wt 55.8 kg   SpO2 97%   BMI 19.27 kg/m   Physical Exam Vitals and nursing note reviewed.  Constitutional:      Appearance: He is well-developed.  HENT:     Head: Atraumatic.  Cardiovascular:     Rate and Rhythm: Normal rate.  Pulmonary:     Effort: Pulmonary effort is normal.  Musculoskeletal:     Cervical back: Neck supple.  Skin:    General: Skin is warm.  Neurological:     Mental Status: He is alert and oriented to person, place, and time.     (all labs ordered are  listed, but only abnormal results are displayed) Labs Reviewed - No data to display  EKG: None  Radiology: No results found.   Procedures   Medications Ordered in the ED  acetaminophen  (TYLENOL ) tablet 650 mg (650 mg Oral Not Given 05/07/24 1904)  loratadine  (CLARITIN ) tablet 10 mg (10 mg Oral Given 05/07/24 1904)  ondansetron  (ZOFRAN -ODT) disintegrating tablet 4 mg (4 mg Oral Given 05/07/24 1905)                                    Medical Decision Making Risk OTC drugs. Prescription drug management.    OBJECTIVE: Patient appears well, non toxic, no respiratory distress, vital signs are as noted.  Throat and pharynx are not showing any exudates.  Neck is supple.  No significant adenopathy in the neck. The chest is clear, without wheezes or rales.  In the ER, patient had p.o. challenge and he has passed.  He has been ambulated by nursing staff without any issues.  No labs indicated.  Differential diagnosis considered for this patient includes: Viral syndrome Influenza Pharyngitis Sinusitis Mononucleosis Electrolyte abnormality   ASSESSMENT:  viral upper respiratory illness and influenza like illness  PLAN: Symptomatic therapy suggested: push fluids, rest, use OTC meds prn, and return  office visit prn if symptoms persist or worsen. Lack of antibiotic effectiveness discussed with her. Call or return to clinic prn if these symptoms worsen or fail to improve as anticipated.     Final diagnoses:  Influenza-like illness    ED Discharge Orders          Ordered    ondansetron  (ZOFRAN -ODT) 8 MG disintegrating tablet  Every 8 hours PRN        05/07/24 2045               Charlyn Sora, MD 05/07/24 2053  "

## 2024-05-07 NOTE — ED Notes (Addendum)
 Patient advanced to solid food for PO challenge. Patient ate 50% of meal but reports no appetite--no nausea, no emesis.

## 2024-05-07 NOTE — Discharge Instructions (Addendum)
 We saw you in the ER for cough, body aches, fevers and runny nose. We think what you have is likely flu or viral syndrome - the treatment for which is symptomatic relief only, and your body will fight the infection off in a few days.  We are prescribing supportive medications. See your primary care doctor in 1 week if the symptoms dont improve.  Please return to the ER if your symptoms worsen; you have increased pain, fevers, chills, inability to keep any medications down, confusion.

## 2024-05-07 NOTE — ED Triage Notes (Signed)
 The pt has been seen here earlier today he reports that hehas total body pain with depressiom

## 2024-05-08 ENCOUNTER — Emergency Department (HOSPITAL_COMMUNITY)
Admission: EM | Admit: 2024-05-08 | Discharge: 2024-05-08 | Disposition: A | Discharge status: Home / Self Care | Payer: MEDICAID | Attending: Emergency Medicine | Admitting: Emergency Medicine

## 2024-05-08 ENCOUNTER — Encounter (HOSPITAL_COMMUNITY): Payer: Self-pay

## 2024-05-08 DIAGNOSIS — J101 Influenza due to other identified influenza virus with other respiratory manifestations: Secondary | ICD-10-CM | POA: Insufficient documentation

## 2024-05-08 DIAGNOSIS — J111 Influenza due to unidentified influenza virus with other respiratory manifestations: Secondary | ICD-10-CM

## 2024-05-08 DIAGNOSIS — M791 Myalgia, unspecified site: Secondary | ICD-10-CM | POA: Diagnosis present

## 2024-05-08 LAB — CBC WITH DIFFERENTIAL/PLATELET
Abs Immature Granulocytes: 0.02 K/uL (ref 0.00–0.07)
Basophils Absolute: 0.1 K/uL (ref 0.0–0.1)
Basophils Relative: 1 %
Eosinophils Absolute: 0.2 K/uL (ref 0.0–0.5)
Eosinophils Relative: 2 %
HCT: 37.3 % — ABNORMAL LOW (ref 39.0–52.0)
Hemoglobin: 11.9 g/dL — ABNORMAL LOW (ref 13.0–17.0)
Immature Granulocytes: 0 %
Lymphocytes Relative: 20 %
Lymphs Abs: 1.4 K/uL (ref 0.7–4.0)
MCH: 30.5 pg (ref 26.0–34.0)
MCHC: 31.9 g/dL (ref 30.0–36.0)
MCV: 95.6 fL (ref 80.0–100.0)
Monocytes Absolute: 0.6 K/uL (ref 0.1–1.0)
Monocytes Relative: 9 %
Neutro Abs: 4.6 K/uL (ref 1.7–7.7)
Neutrophils Relative %: 68 %
Platelets: 247 K/uL (ref 150–400)
RBC: 3.9 MIL/uL — ABNORMAL LOW (ref 4.22–5.81)
RDW: 13.7 % (ref 11.5–15.5)
WBC: 6.8 K/uL (ref 4.0–10.5)
nRBC: 0 % (ref 0.0–0.2)

## 2024-05-08 LAB — COMPREHENSIVE METABOLIC PANEL WITH GFR
ALT: 11 U/L (ref 0–44)
AST: 24 U/L (ref 15–41)
Albumin: 3.9 g/dL (ref 3.5–5.0)
Alkaline Phosphatase: 48 U/L (ref 38–126)
Anion gap: 11 (ref 5–15)
BUN: 13 mg/dL (ref 6–20)
CO2: 26 mmol/L (ref 22–32)
Calcium: 9.1 mg/dL (ref 8.9–10.3)
Chloride: 99 mmol/L (ref 98–111)
Creatinine, Ser: 1.07 mg/dL (ref 0.61–1.24)
GFR, Estimated: >60 mL/min
Glucose, Bld: 106 mg/dL — ABNORMAL HIGH (ref 70–99)
Potassium: 3.9 mmol/L (ref 3.5–5.1)
Sodium: 136 mmol/L (ref 135–145)
Total Bilirubin: 0.6 mg/dL (ref 0.0–1.2)
Total Protein: 7 g/dL (ref 6.5–8.1)

## 2024-05-08 LAB — RESP PANEL BY RT-PCR (RSV, FLU A&B, COVID)  RVPGX2
Influenza A by PCR: POSITIVE — AB
Influenza B by PCR: NEGATIVE
Resp Syncytial Virus by PCR: NEGATIVE
SARS Coronavirus 2 by RT PCR: NEGATIVE

## 2024-05-08 MED ORDER — OSELTAMIVIR PHOSPHATE 75 MG PO CAPS: 75.0000 mg | ORAL_CAPSULE | Freq: Two times a day (BID) | ORAL | 0 refills | Status: AC

## 2024-05-08 MED ORDER — OSELTAMIVIR PHOSPHATE 75 MG PO CAPS
75.0000 mg | ORAL_CAPSULE | Freq: Once | ORAL | Status: AC
  Administered 2024-05-08: 75 mg via ORAL
  Filled 2024-05-08: qty 1

## 2024-05-08 NOTE — ED Provider Notes (Signed)
 " [name] EMERGENCY DEPARTMENT AT [facility] Provider Note   CSN: [id number] Arrival date & time: 05/07/24  2132     Patient presents with: total body pain   Giovani Delancey is a 54 y.o. male.   HPI 54 year old male presenting with flulike symptoms.  Patient was last seen on 05/07/2024 and was discharged at 2053.  He then checked back into the ER at 2132.  He reports that he is still having the symptoms and does not feel he got proper treatment before.  Because of this he reports he is having dark thoughts.  He has no new symptoms and reports that the others have stayed the same.  Patient denied any chest pain or shortness of breath.  Denied any abdominal pain.  He has had the symptoms for 4 days.    Prior to Admission medications  Medication Sig Start Date End Date Taking? Authorizing Provider  Multiple Vitamin (MULTIVITAMIN WITH MINERALS) TABS tablet Take 1 tablet by mouth daily. 02/29/24   Jadapalle, Sree, MD  ondansetron  (ZOFRAN -ODT) 8 MG disintegrating tablet Take 1 tablet (8 mg total) by mouth every 8 (eight) hours as needed for nausea. 05/07/24   Charlyn Sora, MD  thiamine  (VITAMIN B-1) 100 MG tablet Take 1 tablet (100 mg total) by mouth daily. 04/25/24   Donnelly Mellow, MD    Allergies: Aspirin, Ibuprofen , Porcine (pork) protein-containing drug products, Acetaminophen , and Sertraline     Review of Systems  All other systems reviewed and are negative.   Updated Vital Signs BP 135/69 (BP Location: Right Arm)   Pulse 81   Temp (!) 102.1 F (38.9 C) (Oral)   Resp 16   Ht 5' 7 (1.702 m)   Wt 55.8 kg   SpO2 98%   BMI 19.27 kg/m   Physical Exam Vitals and nursing note reviewed.  Constitutional:      General: He is not in acute distress.    Appearance: He is not ill-appearing, toxic-appearing or diaphoretic.  HENT:     Head:     Comments: No sinus tenderness.    Nose: Congestion and rhinorrhea present.     Mouth/Throat:     Pharynx: Oropharynx is  clear.  Cardiovascular:     Rate and Rhythm: Normal rate and regular rhythm.     Pulses: Normal pulses.     Heart sounds: Normal heart sounds.  Pulmonary:     Effort: Pulmonary effort is normal.     Breath sounds: Normal breath sounds.  Abdominal:     General: Abdomen is flat. Bowel sounds are normal.     Palpations: Abdomen is soft.  Skin:    General: Skin is warm and dry.  Neurological:     General: No focal deficit present.     Mental Status: He is alert.     (all labs ordered are listed, but only abnormal results are displayed) Labs Reviewed  CBC WITH DIFFERENTIAL/PLATELET  COMPREHENSIVE METABOLIC PANEL WITH GFR    EKG: None  Radiology: No results found.   Procedures   Medications Ordered in the ED - No data to display                                  Medical Decision Making Amount and/or Complexity of Data Reviewed Labs: ordered.   Impression: 54 year old male presenting with flulike symptoms.  Differential diagnosis includes flu, COVID, viral URI  Additional History: Patient was able to  provide history.  I also reviewed other outpatient notes, ER notes, and psych notes.  Labs: CBC shows no acute abnormalities.  CMP showed no acute abnormality.  Imaging: None  ED Course/Meds: 54 year old male presenting with flulike symptoms.  He was a well-appearing in no acute distress.  He was also seen on 05/07/2024 for the same issues.  He was discharged at 2045 and rechecked in at 2130.  He reports he did not receive the care that he wanted when he was here before.  Because of this he says it gave him dark thoughts and SI.  He has been seen multiple times for this complaint.  He has never had an attempt.  I spoke with my attending physician, Dr. Bari, and she recommended that he did not need a psych workup.  He was just discharged from a inpatient psychiatric stay.  She recommended that I provide him with behavioral health urgent care information and follow-up.   Patient's labs came back with no abnormalities.  Patient WBC 6.8.  I talked to him about his fever and offered to give him some Tylenol .  However he says that the Tylenol  makes him feel worse and have splotches on his skin so he does not want to take it.  He was also allergic to ibuprofen . Patient was provided with call for [facility].  He was informed that they are open 24/7 if he needed any help.  He also has not follow-up with his psychiatrist since his discharge.  He was recommended to follow-up with the outpatient services that he was recommended.  He was also told that if he has any worsening symptoms he can return to the ER.  Patient remained stable in the ER and at discharge.      Final diagnoses:  None    ED Discharge Orders     None          Rosaline Almarie KANDICE DEVONNA 05/08/24 9360    Bari Charmaine FALCON, MD 05/09/24 0327  "

## 2024-05-08 NOTE — Discharge Instructions (Addendum)
 Continue to drink lots of fluids and rest.  You can take over-the-counter medications to help with congestion.  I am also putting information for the [facility] behavioral urgent care services.  If you continue to have dark thoughts you can follow-up here.  I also recommend that you follow-up with your psychiatrist that you were seeing while you were inpatient.

## 2024-05-08 NOTE — ED Triage Notes (Signed)
 [name], was seen last night and dc, [facility] reports his HR was in the 40s and out of range, called EMS. Pt HR on monitor upon arrival 38-70. Pt reports dizziness, flu like symptoms ongoing for 4 days. Pt endorses SI with no plan.

## 2024-05-08 NOTE — ED Provider Notes (Signed)
 " [facility] Provider Note   CSN: [id number] Arrival date & time: 05/08/24  1014     Patient presents with: No chief complaint on file.   [name] is a 54 y.o. male.   HPI   This patient is a 54 year old male who had been seen twice in the last 24 hours in the emergency department complaining of myalgias and flulike illness, he had a normal workup from a lab standpoint, thought to be influenza-like illness, he has not had a flu swab and was referred to the behavioral health urgent care as he has had multiple psychiatric evaluations including a recent admission to the hospital approximately 2 weeks ago.  He went to the behavioral health urgent care this morning and because he had a heart rate in the 40s he was sent here.  On arrival the patient's heart rate is around 65, he complains of pain in his legs and his arms headache and lightheadedness, states this been going on for a while and is not new to today but the myalgias were worse in the last couple of days  Prior to Admission medications  Medication Sig Start Date End Date Taking? Authorizing Provider  oseltamivir  (TAMIFLU ) 75 MG capsule Take 1 capsule (75 mg total) by mouth every 12 (twelve) hours. 05/08/24  Yes Cleotilde Rogue, MD  Multiple Vitamin (MULTIVITAMIN WITH MINERALS) TABS tablet Take 1 tablet by mouth daily. 02/29/24   Jadapalle, Sree, MD  ondansetron  (ZOFRAN -ODT) 8 MG disintegrating tablet Take 1 tablet (8 mg total) by mouth every 8 (eight) hours as needed for nausea. 05/07/24   Charlyn Sora, MD  thiamine  (VITAMIN B-1) 100 MG tablet Take 1 tablet (100 mg total) by mouth daily. 04/25/24   Donnelly Mellow, MD    Allergies: Aspirin, Ibuprofen , Porcine (pork) protein-containing drug products, Acetaminophen , and Sertraline     Review of Systems  All other systems reviewed and are negative.   Updated Vital Signs BP 128/67   Pulse 68   Temp 98.7 F (37.1 C) (Oral)   Resp 18    SpO2 99%   Physical Exam Vitals and nursing note reviewed.  Constitutional:      General: He is not in acute distress.    Appearance: He is well-developed.  HENT:     Head: Normocephalic and atraumatic.     Mouth/Throat:     Pharynx: No oropharyngeal exudate.     Comments: Oropharynx is clear and moist, there is no erythema or exudate, no trismus or torticollis Eyes:     General: No scleral icterus.       Right eye: No discharge.        Left eye: No discharge.     Conjunctiva/sclera: Conjunctivae normal.     Pupils: Pupils are equal, round, and reactive to light.  Neck:     Thyroid: No thyromegaly.     Vascular: No JVD.  Cardiovascular:     Rate and Rhythm: Normal rate and regular rhythm.     Heart sounds: Normal heart sounds. No murmur heard.    No friction rub. No gallop.  Pulmonary:     Effort: Pulmonary effort is normal. No respiratory distress.     Breath sounds: Normal breath sounds. No wheezing or rales.  Abdominal:     General: Bowel sounds are normal. There is no distension.     Palpations: Abdomen is soft. There is no mass.     Tenderness: There is no abdominal tenderness.  Musculoskeletal:  General: No tenderness. Normal range of motion.     Cervical back: Normal range of motion and neck supple.     Right lower leg: No edema.     Left lower leg: No edema.  Lymphadenopathy:     Cervical: No cervical adenopathy.  Skin:    General: Skin is warm and dry.     Findings: No erythema or rash.  Neurological:     Mental Status: He is alert.     Coordination: Coordination normal.  Psychiatric:        Behavior: Behavior normal.     (all labs ordered are listed, but only abnormal results are displayed) Labs Reviewed  RESP PANEL BY RT-PCR (RSV, FLU A&B, COVID)  RVPGX2 - Abnormal; Notable for the following components:      Result Value   Influenza A by PCR POSITIVE (*)    All other components within normal limits    EKG: EKG  Interpretation Date/Time:  Monday May 08 2024 11:15:02 EST Ventricular Rate:  78 PR Interval:  128 QRS Duration:  94 QT Interval:  408 QTC Calculation: 465 R Axis:   88  Text Interpretation: Sinus rhythm with Premature atrial complexes with Abberant conduction Moderate voltage criteria for LVH, may be normal variant ( Sokolow-Lyon , Cornell product ) Borderline ECG When compared with ECG of 18-Apr-2024 22:51, PREVIOUS ECG IS PRESENT Confirmed by Cleotilde Rogue ([id number]) on 05/08/2024 12:16:38 PM  Radiology: No results found.   Procedures   Medications Ordered in the ED  oseltamivir  (TAMIFLU ) capsule 75 mg (has no administration in time range)                                    Medical Decision Making Amount and/or Complexity of Data Reviewed ECG/medicine tests: ordered.  Risk Prescription drug management.   Cardiac and pulmonary exams are unremarkable, vital signs are totally normal, heart rate is 68, will get EKG and flu swab, the patient is clinically appearing stable for evaluation at behavioral health urgent care.  He denies that he is actively suicidal to me.  Looking back at his medical record it appears that he has a habit of seeing this when he wants something, he does not have a plan and states that he is not actively suicidal but occasionally has dark thoughts.  The patient's influenza test is positive, he has an allergy to anti-inflammatories but thankfully he is not febrile tachycardic or hypotensive.  He can be treated with Tylenol  as needed for body aches and Tamiflu  for influenza.  He is not coughing or short of breath at this time.     Final diagnoses:  Influenza    ED Discharge Orders          Ordered    oseltamivir  (TAMIFLU ) 75 MG capsule  Every 12 hours        05/08/24 1215               Cleotilde Rogue, MD 05/08/24 1216  "

## 2024-05-08 NOTE — Discharge Instructions (Addendum)
 It appears that you do have the flu, all of your vital signs are normal, your heart rate is normal, you are medically cleared to be seen by the behavioral health urgent care.  Take Tamiflu  twice a day for the next 5 days to finish treating the flu which you did test positive for.  You can take Tylenol  up to 1000 mg every 6 hours as needed for body aches
